# Patient Record
Sex: Female | Born: 1945
Health system: Southern US, Community
[De-identification: ages and names within clinical notes are randomized; demographics above are authoritative.]

## PROBLEM LIST (undated history)

## (undated) ENCOUNTER — Emergency Department (HOSPITAL_COMMUNITY): Admission: EM | Payer: Medicare Other | Source: Home / Self Care

## (undated) DIAGNOSIS — G4733 Obstructive sleep apnea (adult) (pediatric): Secondary | ICD-10-CM

## (undated) DIAGNOSIS — Z0189 Encounter for other specified special examinations: Secondary | ICD-10-CM

## (undated) DIAGNOSIS — R0989 Other specified symptoms and signs involving the circulatory and respiratory systems: Secondary | ICD-10-CM

## (undated) DIAGNOSIS — I10 Essential (primary) hypertension: Secondary | ICD-10-CM

## (undated) DIAGNOSIS — I358 Other nonrheumatic aortic valve disorders: Secondary | ICD-10-CM

## (undated) DIAGNOSIS — R51 Headache: Secondary | ICD-10-CM

## (undated) DIAGNOSIS — R519 Headache, unspecified: Secondary | ICD-10-CM

## (undated) DIAGNOSIS — E7800 Pure hypercholesterolemia, unspecified: Secondary | ICD-10-CM

## (undated) DIAGNOSIS — R9439 Abnormal result of other cardiovascular function study: Secondary | ICD-10-CM

## (undated) DIAGNOSIS — B029 Zoster without complications: Secondary | ICD-10-CM

## (undated) DIAGNOSIS — R7303 Prediabetes: Secondary | ICD-10-CM

## (undated) DIAGNOSIS — M1990 Unspecified osteoarthritis, unspecified site: Secondary | ICD-10-CM

## (undated) DIAGNOSIS — J45909 Unspecified asthma, uncomplicated: Secondary | ICD-10-CM

## (undated) DIAGNOSIS — Z9989 Dependence on other enabling machines and devices: Secondary | ICD-10-CM

## (undated) HISTORY — DX: Unspecified asthma, uncomplicated: J45.909

## (undated) HISTORY — PX: TUBAL LIGATION: SHX77

## (undated) HISTORY — DX: Encounter for other specified special examinations: Z01.89

## (undated) HISTORY — DX: Abnormal result of other cardiovascular function study: R94.39

## (undated) HISTORY — DX: Dependence on other enabling machines and devices: Z99.89

## (undated) HISTORY — DX: Obstructive sleep apnea (adult) (pediatric): G47.33

## (undated) HISTORY — DX: Other nonrheumatic aortic valve disorders: I35.8

## (undated) HISTORY — DX: Other specified symptoms and signs involving the circulatory and respiratory systems: R09.89

---

## 1996-07-16 HISTORY — PX: CHOLECYSTECTOMY: SHX55

## 2015-08-26 DIAGNOSIS — R0602 Shortness of breath: Secondary | ICD-10-CM | POA: Diagnosis not present

## 2015-08-26 DIAGNOSIS — E119 Type 2 diabetes mellitus without complications: Secondary | ICD-10-CM | POA: Diagnosis not present

## 2015-08-26 DIAGNOSIS — I10 Essential (primary) hypertension: Secondary | ICD-10-CM | POA: Diagnosis not present

## 2015-08-26 DIAGNOSIS — E538 Deficiency of other specified B group vitamins: Secondary | ICD-10-CM | POA: Diagnosis not present

## 2015-11-24 DIAGNOSIS — M25519 Pain in unspecified shoulder: Secondary | ICD-10-CM | POA: Diagnosis not present

## 2015-11-24 DIAGNOSIS — M7540 Impingement syndrome of unspecified shoulder: Secondary | ICD-10-CM | POA: Diagnosis not present

## 2015-11-24 DIAGNOSIS — M19019 Primary osteoarthritis, unspecified shoulder: Secondary | ICD-10-CM | POA: Diagnosis not present

## 2015-12-05 DIAGNOSIS — M19019 Primary osteoarthritis, unspecified shoulder: Secondary | ICD-10-CM | POA: Diagnosis not present

## 2015-12-05 DIAGNOSIS — M7540 Impingement syndrome of unspecified shoulder: Secondary | ICD-10-CM | POA: Diagnosis not present

## 2015-12-05 DIAGNOSIS — M25519 Pain in unspecified shoulder: Secondary | ICD-10-CM | POA: Diagnosis not present

## 2015-12-21 DIAGNOSIS — Z008 Encounter for other general examination: Secondary | ICD-10-CM | POA: Diagnosis not present

## 2015-12-21 DIAGNOSIS — R011 Cardiac murmur, unspecified: Secondary | ICD-10-CM | POA: Diagnosis not present

## 2015-12-21 DIAGNOSIS — E119 Type 2 diabetes mellitus without complications: Secondary | ICD-10-CM | POA: Diagnosis not present

## 2015-12-21 DIAGNOSIS — Z713 Dietary counseling and surveillance: Secondary | ICD-10-CM | POA: Diagnosis not present

## 2015-12-21 DIAGNOSIS — E538 Deficiency of other specified B group vitamins: Secondary | ICD-10-CM | POA: Diagnosis not present

## 2015-12-21 DIAGNOSIS — I10 Essential (primary) hypertension: Secondary | ICD-10-CM | POA: Diagnosis not present

## 2015-12-21 DIAGNOSIS — R0602 Shortness of breath: Secondary | ICD-10-CM | POA: Diagnosis not present

## 2015-12-21 DIAGNOSIS — Z1231 Encounter for screening mammogram for malignant neoplasm of breast: Secondary | ICD-10-CM | POA: Diagnosis not present

## 2015-12-21 DIAGNOSIS — R079 Chest pain, unspecified: Secondary | ICD-10-CM | POA: Diagnosis not present

## 2015-12-21 DIAGNOSIS — E559 Vitamin D deficiency, unspecified: Secondary | ICD-10-CM | POA: Diagnosis not present

## 2015-12-22 DIAGNOSIS — M7540 Impingement syndrome of unspecified shoulder: Secondary | ICD-10-CM | POA: Diagnosis not present

## 2015-12-22 DIAGNOSIS — M25519 Pain in unspecified shoulder: Secondary | ICD-10-CM | POA: Diagnosis not present

## 2015-12-22 DIAGNOSIS — M19019 Primary osteoarthritis, unspecified shoulder: Secondary | ICD-10-CM | POA: Diagnosis not present

## 2015-12-27 DIAGNOSIS — R0602 Shortness of breath: Secondary | ICD-10-CM | POA: Diagnosis not present

## 2015-12-27 DIAGNOSIS — M7540 Impingement syndrome of unspecified shoulder: Secondary | ICD-10-CM | POA: Diagnosis not present

## 2015-12-27 DIAGNOSIS — M19019 Primary osteoarthritis, unspecified shoulder: Secondary | ICD-10-CM | POA: Diagnosis not present

## 2015-12-27 DIAGNOSIS — E559 Vitamin D deficiency, unspecified: Secondary | ICD-10-CM | POA: Diagnosis not present

## 2015-12-27 DIAGNOSIS — R011 Cardiac murmur, unspecified: Secondary | ICD-10-CM | POA: Diagnosis not present

## 2015-12-27 DIAGNOSIS — M25519 Pain in unspecified shoulder: Secondary | ICD-10-CM | POA: Diagnosis not present

## 2015-12-27 DIAGNOSIS — E119 Type 2 diabetes mellitus without complications: Secondary | ICD-10-CM | POA: Diagnosis not present

## 2015-12-27 DIAGNOSIS — I10 Essential (primary) hypertension: Secondary | ICD-10-CM | POA: Diagnosis not present

## 2016-03-28 DIAGNOSIS — E119 Type 2 diabetes mellitus without complications: Secondary | ICD-10-CM | POA: Diagnosis not present

## 2016-03-28 DIAGNOSIS — E559 Vitamin D deficiency, unspecified: Secondary | ICD-10-CM | POA: Diagnosis not present

## 2016-03-28 DIAGNOSIS — Z23 Encounter for immunization: Secondary | ICD-10-CM | POA: Diagnosis not present

## 2016-03-28 DIAGNOSIS — I10 Essential (primary) hypertension: Secondary | ICD-10-CM | POA: Diagnosis not present

## 2016-04-02 DIAGNOSIS — J309 Allergic rhinitis, unspecified: Secondary | ICD-10-CM | POA: Diagnosis not present

## 2016-04-02 DIAGNOSIS — L299 Pruritus, unspecified: Secondary | ICD-10-CM | POA: Diagnosis not present

## 2016-04-02 DIAGNOSIS — L509 Urticaria, unspecified: Secondary | ICD-10-CM | POA: Diagnosis not present

## 2016-04-02 DIAGNOSIS — Z91018 Allergy to other foods: Secondary | ICD-10-CM | POA: Diagnosis not present

## 2016-04-10 DIAGNOSIS — M7540 Impingement syndrome of unspecified shoulder: Secondary | ICD-10-CM | POA: Diagnosis not present

## 2016-04-10 DIAGNOSIS — M25569 Pain in unspecified knee: Secondary | ICD-10-CM | POA: Diagnosis not present

## 2016-04-10 DIAGNOSIS — M19019 Primary osteoarthritis, unspecified shoulder: Secondary | ICD-10-CM | POA: Diagnosis not present

## 2016-04-10 DIAGNOSIS — M170 Bilateral primary osteoarthritis of knee: Secondary | ICD-10-CM | POA: Diagnosis not present

## 2016-04-11 DIAGNOSIS — L298 Other pruritus: Secondary | ICD-10-CM | POA: Diagnosis not present

## 2016-04-11 DIAGNOSIS — T887XXA Unspecified adverse effect of drug or medicament, initial encounter: Secondary | ICD-10-CM | POA: Diagnosis not present

## 2016-04-16 DIAGNOSIS — M7540 Impingement syndrome of unspecified shoulder: Secondary | ICD-10-CM | POA: Diagnosis not present

## 2016-04-16 DIAGNOSIS — M19019 Primary osteoarthritis, unspecified shoulder: Secondary | ICD-10-CM | POA: Diagnosis not present

## 2016-04-16 DIAGNOSIS — M170 Bilateral primary osteoarthritis of knee: Secondary | ICD-10-CM | POA: Diagnosis not present

## 2016-04-16 DIAGNOSIS — M25569 Pain in unspecified knee: Secondary | ICD-10-CM | POA: Diagnosis not present

## 2016-04-30 DIAGNOSIS — Z91018 Allergy to other foods: Secondary | ICD-10-CM | POA: Diagnosis not present

## 2016-04-30 DIAGNOSIS — L299 Pruritus, unspecified: Secondary | ICD-10-CM | POA: Diagnosis not present

## 2016-04-30 DIAGNOSIS — L509 Urticaria, unspecified: Secondary | ICD-10-CM | POA: Diagnosis not present

## 2016-04-30 DIAGNOSIS — R21 Rash and other nonspecific skin eruption: Secondary | ICD-10-CM | POA: Diagnosis not present

## 2016-05-02 DIAGNOSIS — N3941 Urge incontinence: Secondary | ICD-10-CM | POA: Diagnosis not present

## 2016-05-02 DIAGNOSIS — E119 Type 2 diabetes mellitus without complications: Secondary | ICD-10-CM | POA: Diagnosis not present

## 2016-05-02 DIAGNOSIS — Z008 Encounter for other general examination: Secondary | ICD-10-CM | POA: Diagnosis not present

## 2016-05-02 DIAGNOSIS — E559 Vitamin D deficiency, unspecified: Secondary | ICD-10-CM | POA: Diagnosis not present

## 2016-05-02 DIAGNOSIS — N8112 Cystocele, lateral: Secondary | ICD-10-CM | POA: Diagnosis not present

## 2016-06-26 ENCOUNTER — Ambulatory Visit (HOSPITAL_COMMUNITY)
Admission: EM | Admit: 2016-06-26 | Discharge: 2016-06-26 | Disposition: A | Discharge status: Home / Self Care | Payer: Medicare Other | Attending: Emergency Medicine | Admitting: Emergency Medicine

## 2016-06-26 ENCOUNTER — Encounter (HOSPITAL_COMMUNITY): Payer: Self-pay | Admitting: Emergency Medicine

## 2016-06-26 DIAGNOSIS — R42 Dizziness and giddiness: Secondary | ICD-10-CM

## 2016-06-26 HISTORY — DX: Essential (primary) hypertension: I10

## 2016-06-26 LAB — POCT URINALYSIS DIP (DEVICE)
Bilirubin Urine: NEGATIVE
Glucose, UA: NEGATIVE mg/dL
Hgb urine dipstick: NEGATIVE
Leukocytes, UA: NEGATIVE
Nitrite: NEGATIVE
Protein, ur: NEGATIVE mg/dL
Specific Gravity, Urine: 1.025 (ref 1.005–1.030)
Urobilinogen, UA: 0.2 mg/dL (ref 0.0–1.0)
pH: 5.5 (ref 5.0–8.0)

## 2016-06-26 LAB — POCT I-STAT, CHEM 8
BUN: 14 mg/dL (ref 6–20)
Calcium, Ion: 1.32 mmol/L (ref 1.15–1.40)
Chloride: 102 mmol/L (ref 101–111)
Creatinine, Ser: 0.9 mg/dL (ref 0.44–1.00)
Glucose, Bld: 95 mg/dL (ref 65–99)
HCT: 43 % (ref 36.0–46.0)
Hemoglobin: 14.6 g/dL (ref 12.0–15.0)
Potassium: 4.5 mmol/L (ref 3.5–5.1)
Sodium: 142 mmol/L (ref 135–145)
TCO2: 27 mmol/L (ref 0–100)

## 2016-06-26 NOTE — ED Provider Notes (Signed)
HPI  SUBJECTIVE:  Rachel Crane is a 70 y.o. female who presents with several episodes of dizziness described as lightheadedness and feeling "off balance" lasting seconds for the past 2 days. States it happens primarily when she bends over to pick something up. She reports fatigue. Symptoms are worse with bending forward, no alleviating factors. She tried garlic for this. States that she is eating and drinking well, her appetite is good. States that she is drinking a liter to a liter and half of water per day. She denies fevers, nasal congestion, sinus pain or pressure, rhinorrhea, postnasal drip, tenderness, ear pain, bodyaches, headaches. No chest pain, shortness breath, coughing, wheezing. No abdominal pain, urinary complaints. No  arm or leg weakness, facial droop, dysarthria. No vaginal bleeding, melena, hematochezia, epistaxis. No change in her medications. She is not taking any other medicines on a regular basis. She has never had symptoms like this before. She has a past medical history of hypertension. No history of A. fib, arrhythmia, diabetes, stroke, hypercholesterolemia, MI, coronary artery disease. She does not smoke or drink. PMD: None   Past Medical History:  Diagnosis Date  . Hypertension     History reviewed. No pertinent surgical history.  History reviewed. No pertinent family history.  Social History  Substance Use Topics  . Smoking status: Never Smoker  . Smokeless tobacco: Never Used  . Alcohol use No    No current facility-administered medications for this encounter.   Current Outpatient Prescriptions:  .  amLODipine (NORVASC) 10 MG tablet, Take 10 mg by mouth daily., Disp: , Rfl:  .  cholecalciferol (VITAMIN D) 1000 units tablet, Take 1,000 Units by mouth daily., Disp: , Rfl:  .  Multiple Vitamins-Minerals (ONE-A-DAY WOMENS 50 PLUS PO), Take 1 tablet by mouth., Disp: , Rfl:   No Known Allergies   ROS  As noted in HPI.   Physical Exam  BP 135/76 (BP  Location: Left Arm)   Pulse 60   Temp 98.7 F (37.1 C) (Oral)   Resp 16   SpO2 100%    Orthostatic VS for the past 24 hrs:  BP- Lying Pulse- Lying BP- Sitting Pulse- Sitting BP- Standing at 0 minutes Pulse- Standing at 0 minutes  06/26/16 1319 124/79 58 131/82 67 134/84 74     Constitutional: Well developed, well nourished, no acute distress Eyes: PERRL, EOMI, conjunctiva normal bilaterally HENT: Normocephalic, atraumatic,mucus membranes moist. TMs normal bilaterally. No nasal congestion. No sinus tenderness. Respiratory: Clear to auscultation bilaterally, no rales, no wheezing, no rhonchi Cardiovascular: Normal rate and rhythm, no murmurs, no gallops, no rubs. No carotid bruit GI: Soft, nondistended, normal bowel sounds, nontender, no rebound, no guarding Back: no CVAT skin: No rash, skin intact Musculoskeletal: No edema, no tenderness, no deformities Neurologic: Alert & oriented x 3, CN II-XII intact, no motor deficits, sensation grossly intact finger-nose, heel shin within normal limits. Dix-Hallpike negative bilaterally. Psychiatric: Speech and behavior appropriate   ED Course   Medications - No data to display  Orders Placed This Encounter  Procedures  . Orthostatic vital signs    Standing Status:   Standing    Number of Occurrences:   1  . POCT urinalysis dip (device)    Standing Status:   Standing    Number of Occurrences:   1  . I-STAT, chem 8    Standing Status:   Standing    Number of Occurrences:   1  . ED EKG    Standing Status:   Standing  Number of Occurrences:   1    Order Specific Question:   Reason for Exam    Answer:   Weakness   Results for orders placed or performed during the hospital encounter of 06/26/16 (from the past 24 hour(s))  POCT urinalysis dip (device)     Status: Abnormal   Collection Time: 06/26/16  1:03 PM  Result Value Ref Range   Glucose, UA NEGATIVE NEGATIVE mg/dL   Bilirubin Urine NEGATIVE NEGATIVE   Ketones, ur TRACE (A)  NEGATIVE mg/dL   Specific Gravity, Urine 1.025 1.005 - 1.030   Hgb urine dipstick NEGATIVE NEGATIVE   pH 5.5 5.0 - 8.0   Protein, ur NEGATIVE NEGATIVE mg/dL   Urobilinogen, UA 0.2 0.0 - 1.0 mg/dL   Nitrite NEGATIVE NEGATIVE   Leukocytes, UA NEGATIVE NEGATIVE  I-STAT, chem 8     Status: None   Collection Time: 06/26/16  1:11 PM  Result Value Ref Range   Sodium 142 135 - 145 mmol/L   Potassium 4.5 3.5 - 5.1 mmol/L   Chloride 102 101 - 111 mmol/L   BUN 14 6 - 20 mg/dL   Creatinine, Ser 0.90 0.44 - 1.00 mg/dL   Glucose, Bld 95 65 - 99 mg/dL   Calcium, Ion 1.32 1.15 - 1.40 mmol/L   TCO2 27 0 - 100 mmol/L   Hemoglobin 14.6 12.0 - 15.0 g/dL   HCT 43.0 36.0 - 46.0 %   No results found.  ED Clinical Impression  Lightheadedness   ED Assessment/Plan  This does not appear to be a stroke. Pt has no other c/o.  We'll check i-STAT, orthostatics, EKG and UA.   EKG: Normal sinus rhythm, rate 60. Normal axis, normal intervals. No hypertrophy. No ST-T wave changes. No previous EKG for comparison.  UA: No UTI. Trace ketones. I-STAT: Normal.  Patient is almost orthostatic, suspect that this is where her symptoms are coming from.  Will advise patient to push fluids, and provide a primary care referral resource list. She will go to the ER for any worsening of her symptoms, chest pain, shortness breath, syncope.  Discussed labs,  MDM, plan and followup with patient. Discussed sn/sx that should prompt return to the ED. Patient  agrees with plan.   Meds ordered this encounter  Medications  . amLODipine (NORVASC) 10 MG tablet    Sig: Take 10 mg by mouth daily.  . Multiple Vitamins-Minerals (ONE-A-DAY WOMENS 50 PLUS PO)    Sig: Take 1 tablet by mouth.  . cholecalciferol (VITAMIN D) 1000 units tablet    Sig: Take 1,000 Units by mouth daily.    *This clinic note was created using Dragon dictation software. Therefore, there may be occasional mistakes despite careful proofreading.  ?     Melynda Ripple, MD 06/26/16 1535

## 2016-06-26 NOTE — Discharge Instructions (Signed)
You need to drink at least 2 L of non-sugary, non-caffeinated beverages per day. Follow up with one of  the primary care physician's below. Go to the ER for chest pain, shortness of breath, if you pass out, or for other concerns.  Below is a list of primary care practices who are taking new patients for you to follow-up with. [facility] [place], [place] [address] [phone]  [facility] [phone] [place] [address]  [facility]: [place]  [phone]  [facility]: [place]   [phone]  [facility]: [address] [place]  [phone]  [place] [facility] : [address] [place] [phone]  [facility]: [address] [place] [phone]  [facility]: [place] [phone]  Dr. Blanchie Serve [place]  [phone]  Dr. Benito Mccreedy, [facility]. [place], [place] [address]  [phone]

## 2016-06-26 NOTE — ED Triage Notes (Signed)
Pt has been suffering from dizziness and fatigue since Sunday.  She reports "a funny felling" in her head.

## 2016-06-30 ENCOUNTER — Encounter (HOSPITAL_COMMUNITY): Payer: Self-pay | Admitting: *Deleted

## 2016-06-30 ENCOUNTER — Emergency Department (HOSPITAL_COMMUNITY)
Admission: EM | Admit: 2016-06-30 | Discharge: 2016-06-30 | Disposition: A | Discharge status: Home / Self Care | Payer: Medicare Other | Attending: Emergency Medicine | Admitting: Emergency Medicine

## 2016-06-30 DIAGNOSIS — R350 Frequency of micturition: Secondary | ICD-10-CM | POA: Diagnosis not present

## 2016-06-30 DIAGNOSIS — Z8742 Personal history of other diseases of the female genital tract: Secondary | ICD-10-CM | POA: Diagnosis not present

## 2016-06-30 DIAGNOSIS — R102 Pelvic and perineal pain: Secondary | ICD-10-CM | POA: Diagnosis not present

## 2016-06-30 DIAGNOSIS — I10 Essential (primary) hypertension: Secondary | ICD-10-CM | POA: Diagnosis not present

## 2016-06-30 LAB — URINALYSIS, ROUTINE W REFLEX MICROSCOPIC
Bilirubin Urine: NEGATIVE
Glucose, UA: NEGATIVE mg/dL
Hgb urine dipstick: NEGATIVE
Ketones, ur: NEGATIVE mg/dL
Nitrite: NEGATIVE
Protein, ur: NEGATIVE mg/dL
Specific Gravity, Urine: 1.016 (ref 1.005–1.030)
pH: 6 (ref 5.0–8.0)

## 2016-06-30 LAB — WET PREP, GENITAL
Clue Cells Wet Prep HPF POC: NONE SEEN
Sperm: NONE SEEN
Trich, Wet Prep: NONE SEEN
Yeast Wet Prep HPF POC: NONE SEEN

## 2016-06-30 MED ORDER — TRAMADOL HCL 50 MG PO TABS
50.0000 mg | ORAL_TABLET | Freq: Once | ORAL | Status: AC
  Administered 2016-06-30: 50 mg via ORAL
  Filled 2016-06-30: qty 1

## 2016-06-30 MED ORDER — TRAMADOL HCL 50 MG PO TABS: 50.0000 mg | ORAL_TABLET | Freq: Three times a day (TID) | ORAL | 0 refills | Status: DC | PRN

## 2016-06-30 NOTE — ED Notes (Signed)
Pt ambulated to restroom, pt tolerated well.  

## 2016-06-30 NOTE — ED Provider Notes (Signed)
[facility] DEPT Provider Note   CSN: [id number] Arrival date & time: 06/30/16  [id number]     History   Chief Complaint Chief Complaint  Patient presents with  . Vaginal Pain    HPI Rachel Crane is a 70 y.o. female.  The history is provided by the patient and medical records. No language interpreter was used.  Vaginal Pain  Pertinent negatives include no abdominal pain and no shortness of breath.   Rachel Crane is a 70 y.o. female  with a PMH of HTN who presents to the Emergency Department complaining of left sided vaginal "burning" x 4 days. Associated symptoms Include urinary frequency-noting that she went to the restroom 6 times last night. She denies burning with urination, but notes a burning sensation to the vagina intermittently throughout the day. She took ibuprofen with some mild relief in symptoms. Patient states that her husband passed away 3 years ago and she has not had intercourse with anyone since that time. She recently moved from [place] and had a gynecologist whom she saw a few years ago. He told her that her uterus was lower andpeople and to return to see him if she ever had any vaginal complaints. She has not seen a  gynecologist in [place], but she has made an appointment for Monday morning at 9:45 AM. She denies associated fevers, abdominal pain, n/v or back pain. No vaginal discharge.   Past Medical History:  Diagnosis Date  . Hypertension     There are no active problems to display for this patient.   Past Surgical History:  Procedure Laterality Date  . CHOLECYSTECTOMY      OB History    No data available       Home Medications    Prior to Admission medications   Medication Sig Start Date End Date Taking? Authorizing Provider  amLODipine (NORVASC) 10 MG tablet Take 10 mg by mouth daily.   Yes Historical Provider, MD  Multiple Vitamins-Minerals (ONE-A-DAY WOMENS 50 PLUS PO) Take 1 tablet by mouth daily.    Yes Historical Provider, MD    traMADol (ULTRAM) 50 MG tablet Take 1 tablet (50 mg total) by mouth every 8 (eight) hours as needed for moderate pain or severe pain. 06/30/16   [place]ell, PA-C    Family History No family history on file.  Social History Social History  Substance Use Topics  . Smoking status: Never Smoker  . Smokeless tobacco: Never Used  . Alcohol use No     Allergies   Patient has no known allergies.   Review of Systems Review of Systems  Constitutional: Negative for chills and fever.  HENT: Negative for congestion.   Eyes: Negative for visual disturbance.  Respiratory: Negative for cough and shortness of breath.   Cardiovascular: Negative.   Gastrointestinal: Negative for abdominal pain, nausea and vomiting.  Genitourinary: Positive for frequency and vaginal pain. Negative for dysuria, flank pain, vaginal bleeding and vaginal discharge.  Musculoskeletal: Negative for back pain and neck pain.  Skin: Negative for rash.  Allergic/Immunologic: Negative for immunocompromised state.     Physical Exam Updated Vital Signs BP 116/59   Pulse 61   Temp 98.1 F (36.7 C) (Oral)   Resp 18   Ht 5\' 6"  (1.676 m)   Wt 94.3 kg   SpO2 99%   BMI 33.57 kg/m   Physical Exam  Constitutional: She is oriented to person, place, and time. She appears well-developed and well-nourished. No distress.  HENT:  Head: Normocephalic and atraumatic.  Cardiovascular: Normal rate, regular rhythm and normal heart sounds.   No murmur heard. Pulmonary/Chest: Effort normal and breath sounds normal. No respiratory distress.  Abdominal: Soft. She exhibits no distension. There is no tenderness.  No CVA tenderness.  Genitourinary:  Genitourinary Comments: Chaperone present for exam. On manual exam, uterus descending into upper vaginal canal. No rashes, lesions, or tenderness to external genitalia. No erythema, injury, or tenderness to vaginal mucosa. No vaginal discharge or bleeding within vaginal vault. No  adnexal masses, tenderness, or fullness.   Musculoskeletal: She exhibits no edema.  Neurological: She is alert and oriented to person, place, and time.  Skin: Skin is warm and dry.  Nursing note and vitals reviewed.    ED Treatments / Results  Labs (all labs ordered are listed, but only abnormal results are displayed) Labs Reviewed  WET PREP, GENITAL - Abnormal; Notable for the following:       Result Value   WBC, Wet Prep HPF POC MODERATE (*)    All other components within normal limits  URINALYSIS, ROUTINE W REFLEX MICROSCOPIC - Abnormal; Notable for the following:    Leukocytes, UA TRACE (*)    Bacteria, UA RARE (*)    Squamous Epithelial / LPF 0-5 (*)    All other components within normal limits    EKG  EKG Interpretation None       Radiology No results found.  Procedures Procedures (including critical care time)  Medications Ordered in ED Medications  traMADol (ULTRAM) tablet 50 mg (50 mg Oral Given 06/30/16 1136)     Initial Impression / Assessment and Plan / ED Course  I have reviewed the triage vital signs and the nursing notes.  Pertinent labs & imaging results that were available during my care of the patient were reviewed by me and considered in my medical decision making (see chart for details).  Clinical Course    Rachel Crane is a 70 y.o. female who presents to ED for a burning sensation to vagina (not with urination) and urinary frequency, although denies dysuria. This has been occurring for the last 4 days. On exam, Patient is nontoxic appearing, afebrile with reassuring vital signs. She has no abdominal or CVA tenderness. Per patient, has been told that her uterus is lower than most and pelvic exam does show uterine prolapse into vaginal canal but not outside the introitus. UA with no signs of infection. Wet prep reassuring. Evaluation does not show pathology that would require ongoing emergent intervention or inpatient treatment. Patient is  hemodynamically stable and mentating appropriately. Patient has an appointment with gynecologist for an initial visit Monday morning (2 days). I encouraged her to keep this scheduled appointment for discussion of today's findings. She does not have a primary care provider in the area and importance of PCP follow-up was discussed. Reasons to return to the ER were discussed and all questions answered.  Patient seen by and discussed with Dr. Ashok Cordia who agrees with treatment plan.    Final Clinical Impressions(s) / ED Diagnoses   Final diagnoses:  History of uterine prolapse    New Prescriptions Discharge Medication List as of 06/30/2016 12:08 PM    START taking these medications   Details  traMADol (ULTRAM) 50 MG tablet Take 1 tablet (50 mg total) by mouth every 8 (eight) hours as needed for moderate pain or severe pain., Starting Sat 06/30/2016, Print         [employer], [place] 06/30/16 1311  Lajean Saver, MD 06/30/16 1434

## 2016-06-30 NOTE — Discharge Instructions (Signed)
Keep your scheduled appointment with the gynecologist on Monday morning. Pain medication only as needed for severe pain, best to take at night approximately an hour before bedtime. Return to ER for new or worsening symptoms, initial concerns. Call the number listed on your insurance card and asked them for help finding a primary care provider in the area.

## 2016-06-30 NOTE — ED Triage Notes (Signed)
Pt states vaginal burning since Sunday accompanied by L lower back pain.  Denies vaginal discharge, but states burning with urination.

## 2016-07-02 ENCOUNTER — Other Ambulatory Visit: Payer: Self-pay | Admitting: Obstetrics & Gynecology

## 2016-07-02 DIAGNOSIS — N898 Other specified noninflammatory disorders of vagina: Secondary | ICD-10-CM | POA: Diagnosis not present

## 2016-07-02 DIAGNOSIS — R102 Pelvic and perineal pain: Secondary | ICD-10-CM | POA: Diagnosis not present

## 2016-07-03 DIAGNOSIS — R102 Pelvic and perineal pain: Secondary | ICD-10-CM | POA: Diagnosis not present

## 2016-07-05 ENCOUNTER — Other Ambulatory Visit: Payer: Medicare Other

## 2016-08-30 ENCOUNTER — Emergency Department (HOSPITAL_COMMUNITY): Payer: Medicare Other

## 2016-08-30 ENCOUNTER — Encounter (HOSPITAL_COMMUNITY): Payer: Self-pay

## 2016-08-30 ENCOUNTER — Emergency Department (HOSPITAL_COMMUNITY)
Admission: EM | Admit: 2016-08-30 | Discharge: 2016-08-30 | Disposition: A | Discharge status: Home / Self Care | Payer: Medicare Other | Attending: Emergency Medicine | Admitting: Emergency Medicine

## 2016-08-30 DIAGNOSIS — Z79899 Other long term (current) drug therapy: Secondary | ICD-10-CM | POA: Diagnosis not present

## 2016-08-30 DIAGNOSIS — R51 Headache: Secondary | ICD-10-CM | POA: Diagnosis not present

## 2016-08-30 DIAGNOSIS — I10 Essential (primary) hypertension: Secondary | ICD-10-CM | POA: Insufficient documentation

## 2016-08-30 DIAGNOSIS — R519 Headache, unspecified: Secondary | ICD-10-CM

## 2016-08-30 HISTORY — DX: Unspecified osteoarthritis, unspecified site: M19.90

## 2016-08-30 MED ORDER — ACETAMINOPHEN 325 MG PO TABS
650.0000 mg | ORAL_TABLET | Freq: Once | ORAL | Status: AC
  Administered 2016-08-30: 650 mg via ORAL
  Filled 2016-08-30: qty 2

## 2016-08-30 MED ORDER — ACETAMINOPHEN 325 MG PO TABS: 650.0000 mg | ORAL_TABLET | Freq: Four times a day (QID) | ORAL | 0 refills | Status: DC | PRN

## 2016-08-30 MED ORDER — IBUPROFEN 400 MG PO TABS: 400.0000 mg | ORAL_TABLET | Freq: Four times a day (QID) | ORAL | 0 refills | Status: DC | PRN

## 2016-08-30 MED ORDER — METOCLOPRAMIDE HCL 5 MG/ML IJ SOLN
10.0000 mg | Freq: Once | INTRAMUSCULAR | Status: DC
  Filled 2016-08-30: qty 2

## 2016-08-30 MED ORDER — NAPROXEN 250 MG PO TABS
500.0000 mg | ORAL_TABLET | Freq: Once | ORAL | Status: AC
  Administered 2016-08-30: 500 mg via ORAL
  Filled 2016-08-30: qty 2

## 2016-08-30 NOTE — ED Notes (Signed)
Pt complains of dizziness/ Pain scale 7 RN notified

## 2016-08-30 NOTE — ED Notes (Signed)
Declined W/C at D/C and was escorted to lobby by RN. 

## 2016-08-30 NOTE — ED Triage Notes (Signed)
Informed EDP that Pt wanted to go home.

## 2016-08-30 NOTE — ED Notes (Signed)
States ibuprofen helps pain but still unable to sleep.

## 2016-08-30 NOTE — ED Notes (Signed)
Patient transported to CT 

## 2016-08-30 NOTE — ED Triage Notes (Signed)
PT reports her nausea is due to the fact she is hungry . Pr does not want nausea meds.

## 2016-08-30 NOTE — ED Provider Notes (Signed)
[facility] DEPT Provider Note   CSN: [id number] Arrival date & time: 08/30/16  1133   By signing my name below, I, Evelene Croon, attest that this documentation has been prepared under the direction and in the presence of Varney Biles, MD . Electronically Signed: Evelene Croon, Scribe. 08/30/2016. 1:59 PM.  History   Chief Complaint Chief Complaint  Patient presents with  . Headache    The history is provided by the patient. No language interpreter was used.     HPI Comments:  Rachel Crane is a 71 y.o. female with a history of HTN, who presents to the Emergency Department complaining of a HA  X 2 weeks. She reports pain predominantly to the  top of her head. At initial time of onset she felt a sharp pain to the occipital region. She notes the sharp pain is intermittent but her pain at this time is more consistent. She reports difficulty sleeping due to the pain; states pain is worse when she lays down, describes a throbbing pain when she lays down. She has taken ibuprofen with mild relief but states she has stopped taking it because she doesn't like to take pills. No exacerbation of pain by light or sound. She denies use of blood thinners. No recent head injury/trauma.  No nausea, vomiting, CP, dizziness, confusion, new weakness, numbness or acute vision changes. She is compliant with her HTN meds.   Past Medical History:  Diagnosis Date  . Arthritis   . Hypertension     There are no active problems to display for this patient.   Past Surgical History:  Procedure Laterality Date  . CHOLECYSTECTOMY      OB History    No data available       Home Medications    Prior to Admission medications   Medication Sig Start Date End Date Taking? Authorizing Provider  amLODipine (NORVASC) 10 MG tablet Take 10 mg by mouth daily.   Yes Historical Provider, MD  Multiple Vitamins-Minerals (ONE-A-DAY WOMENS 50 PLUS PO) Take 1 tablet by mouth daily.    Yes Historical Provider, MD    Omega-3 Fatty Acids (FISH OIL) 1000 MG CAPS Take 1,000 mg by mouth daily.   Yes Historical Provider, MD  acetaminophen (TYLENOL) 325 MG tablet Take 2 tablets (650 mg total) by mouth every 6 (six) hours as needed. 08/30/16   Varney Biles, MD  ibuprofen (ADVIL,MOTRIN) 400 MG tablet Take 1 tablet (400 mg total) by mouth every 6 (six) hours as needed. 08/30/16   Varney Biles, MD  traMADol (ULTRAM) 50 MG tablet Take 1 tablet (50 mg total) by mouth every 8 (eight) hours as needed for moderate pain or severe pain. Patient not taking: Reported on 08/30/2016 06/30/16   [place], PA-C    Family History History reviewed. No pertinent family history.  Social History Social History  Substance Use Topics  . Smoking status: Never Smoker  . Smokeless tobacco: Never Used  . Alcohol use No     Allergies   Patient has no known allergies.   Review of Systems Review of Systems  10 systems reviewed and all are negative for acute change except as noted in the HPI.   Physical Exam Updated Vital Signs BP 146/73 (BP Location: Right Arm)   Pulse 64   Temp 98.6 F (37 C) (Oral)   Resp 18   Ht 5\' 6"  (1.676 m)   Wt 203 lb (92.1 kg)   SpO2 99%   BMI 32.77 kg/m  Physical Exam  Constitutional: She is oriented to person, place, and time. She appears well-developed and well-nourished. No distress.  HENT:  Head: Normocephalic and atraumatic.  Eyes: Conjunctivae are normal.  No horizontal or vertical nystagmus   Neck: Normal range of motion.  Cardiovascular: Normal rate, regular rhythm and normal heart sounds.   Pulmonary/Chest: Effort normal and breath sounds normal. No respiratory distress.  Lungs clear to auscultation  Abdominal: Soft. There is no tenderness.  Musculoskeletal: Normal range of motion.  Neurological: She is alert and oriented to person, place, and time. No cranial nerve deficit.  CN 2-12 intact Upper and lower extremity strength intact, motor exam equal   Skin:  Skin is warm and dry.  Psychiatric: She has a normal mood and affect.  Nursing note and vitals reviewed.    ED Treatments / Results  DIAGNOSTIC STUDIES:  Oxygen Saturation is 98% on RA, normal by my interpretation.    COORDINATION OF CARE:  1:57 PM Discussed treatment plan with pt at bedside and pt agreed to plan.  Labs (all labs ordered are listed, but only abnormal results are displayed) Labs Reviewed - No data to display  EKG  EKG Interpretation None       Radiology Ct Head Wo Contrast  Result Date: 08/30/2016 CLINICAL DATA:  71 year old female with 1 month history of right-sided posterior headache extending to the vertex. EXAM: CT HEAD WITHOUT CONTRAST TECHNIQUE: Contiguous axial images were obtained from the base of the skull through the vertex without intravenous contrast. COMPARISON:  No priors. FINDINGS: Brain: No evidence of acute infarction, hemorrhage, hydrocephalus, extra-axial collection or mass lesion/mass effect. Vascular: No hyperdense vessel or unexpected calcification. Skull: Normal. Negative for fracture or focal lesion. Sinuses/Orbits: No acute finding. Other: None. IMPRESSION: 1. No acute intracranial abnormalities. 2. The appearance of the brain is normal. Electronically Signed   By: Vinnie Langton M.D.   On: 08/30/2016 15:42    Procedures Procedures (including critical care time)  Medications Ordered in ED Medications  metoCLOPramide (REGLAN) injection 10 mg (10 mg Intramuscular Not Given 08/30/16 1632)  acetaminophen (TYLENOL) tablet 650 mg (650 mg Oral Given 08/30/16 1522)  naproxen (NAPROSYN) tablet 500 mg (500 mg Oral Given 08/30/16 1634)     Initial Impression / Assessment and Plan / ED Course  I have reviewed the triage vital signs and the nursing notes.  Pertinent labs & imaging results that were available during my care of the patient were reviewed by me and considered in my medical decision making (see chart for details).      4:58 PM  Pt reassessed and updated with results. Instructed to follow up with neurology. Return precautions given at this time.  At reassessment time, pt reported that her pain was better with meds in the ER. No narcotic was given in the ER.  Results from the ER workup discussed with the patient face to face and all questions answered to the best of my ability.  Strict ER return precautions have been discussed, and patient is agreeing with the plan and is comfortable with the workup done and the recommendations from the ER.   Final Clinical Impressions(s) / ED Diagnoses   Final diagnoses:  Bad headache    New Prescriptions Discharge Medication List as of 08/30/2016  5:09 PM    START taking these medications   Details  acetaminophen (TYLENOL) 325 MG tablet Take 2 tablets (650 mg total) by mouth every 6 (six) hours as needed., Starting Thu 08/30/2016, Print  ibuprofen (ADVIL,MOTRIN) 400 MG tablet Take 1 tablet (400 mg total) by mouth every 6 (six) hours as needed., Starting Thu 08/30/2016, Print       I personally performed the services described in this documentation, which was scribed in my presence. The recorded information has been reviewed and is accurate.   DDX includes: Primary headaches - including migrainous headaches, cluster headaches, tension headaches. ICH Carotid dissection Cavernous sinus thrombosiss Tumor Vascular headaches AV malformation Brain aneurysm Muscular headaches Temporal arteritis  A/P: Pt comes in with cc of headaches. Headaches have been present for several days now, and seems like the are worse at night time when she tried to sleep. Pt has no associated nausea, vomiting, seizures, loss of consciousness or new visual complains, weakness, numbness, dizziness or gait instability. We will get CT head - r/o space occupying lesions.  Pt's pain is in the neck area and radiates up. The pain is not severe, there is no associated dizziness, vision changes and the  pain is worse with movement. We considered vertebral dissection and aneurysms in our differential - but with the waxing and waning of the pain and no focal neuro deficits - we think CT angio will not be  necessary emergently. Pt truly doesn't have hard risk factors for either condition - she has no family hx of vascular disease or brain bleeds and she denies trauma and her BP is well controlled and she doesn't smoke.  Pt has no pain around the temple region and no jaw pain - so doubt temporal arteritis. With the same thought process, it seems that this is not glaucoma.     Varney Biles, MD 08/30/16 1739

## 2016-08-30 NOTE — ED Triage Notes (Signed)
Pt presents with 2 week h/o R sided neck pain that radiates to top of her head.  Ibuprofen reduces pain " a little" but reports she can't sleep due to pain.  Pt reports turning her head increases pain, denies any injury.

## 2016-08-30 NOTE — Discharge Instructions (Signed)
We saw you in the ER for headaches. All the labs and imaging are normal. °We are not sure what is causing your headaches, however, there appears to be no evidence of infection, bleeds or tumors based on our exam and results. ° °Please take motrin round the clock for the next 6 hours, and take other meds prescribed only for break through pain. °See your doctor if the pain persists, as you might need better medications or a specialist. ° °Please return to the ER if the headache gets severe and in not improving, you have associated new one sided numbness, tingling, weakness or confusion, seizures, poor balance or poor vision. ° ° ° ° °

## 2016-08-30 NOTE — ED Notes (Signed)
Pt state she understands instructions. Home astable with steady gait with friend.

## 2016-09-06 ENCOUNTER — Encounter: Payer: Self-pay | Admitting: Neurology

## 2016-09-06 ENCOUNTER — Ambulatory Visit (INDEPENDENT_AMBULATORY_CARE_PROVIDER_SITE_OTHER): Payer: Medicare Other | Admitting: Neurology

## 2016-09-06 VITALS — BP 134/79 | HR 72 | Resp 20 | Ht 66.0 in | Wt 208.0 lb

## 2016-09-06 DIAGNOSIS — R351 Nocturia: Secondary | ICD-10-CM | POA: Diagnosis not present

## 2016-09-06 DIAGNOSIS — F419 Anxiety disorder, unspecified: Secondary | ICD-10-CM

## 2016-09-06 DIAGNOSIS — R519 Headache, unspecified: Secondary | ICD-10-CM

## 2016-09-06 DIAGNOSIS — R51 Headache: Secondary | ICD-10-CM

## 2016-09-06 DIAGNOSIS — R0683 Snoring: Secondary | ICD-10-CM

## 2016-09-06 NOTE — Patient Instructions (Addendum)
You can take benadryl but not every day. I think your headaches may be related to sleep deprivation.   Your exam looks good neurologically and your CT head was normal, which is reassuring.   Based on your symptoms and your exam I believe you are at risk for obstructive sleep apnea or OSA, and I think we should proceed with a sleep study to determine whether you do or do not have OSA and how severe it is. If you have more than mild OSA, I want you to consider treatment with CPAP. Please remember, the risks and ramifications of moderate to severe obstructive sleep apnea or OSA are: Cardiovascular disease, including congestive heart failure, stroke, difficult to control hypertension, arrhythmias, and even type 2 diabetes has been linked to untreated OSA. Sleep apnea causes disruption of sleep and sleep deprivation in most cases, which, in turn, can cause recurrent headaches, problems with memory, mood, concentration, focus, and vigilance. Most people with untreated sleep apnea report excessive daytime sleepiness, which can affect their ability to drive. Please do not drive if you feel sleepy.   I will likely see you back after your sleep study to go over the test results and where to go from there. We will call you after your sleep study to advise about the results (most likely, you will hear from Beverlee Nims, my nurse) and to set up an appointment at the time, as necessary.    Our sleep lab administrative assistant, Arrie Aran will meet with you or call you to schedule your sleep study. If you don't hear back from her by next week please feel free to call her at [phone]. This is her direct line and please leave a message with your phone number to call back if you get the voicemail box. She will call back as soon as possible.

## 2016-09-06 NOTE — Progress Notes (Signed)
Subjective:    Patient ID: Rachel Crane is a 71 y.o. female.  HPI     Star Age, MD, PhD [facility] [address], [address] [place], [place] [address]  I saw patient Rachel Crane as an emergency room referral for headaches. She is a 71 year old right-handed woman with an underlying medical history of hypertension, arthritis, and obesity, who presented to the emergency room on 08/30/2016 with a two-week history of severe headache which was primarily bilateral headache, also on the top of her head. I reviewed the emergency room records. She reported a sharp intermittent headache. It was worse with lying down and became throbbing. She had taken some ibuprofen over-the-counter. She did not report much in the way of photophobia. She had no nausea or vomiting or other neurological accompaniment and no history of trauma. She had a head CT without contrast on 08/30/2016 which I reviewed: No acute intracranial abnormalities. In addition, I first only reviewed the images through the PACS system. I did not detect any significant atrophy or white matter changes. She was treated symptomatically with Tylenol, naproxen, and she declined Reglan. She was discharged to home. She reports that she has not been able to sleep very well. She has intermittent, rare in fact episodes of sharp shooting pains that start in the right occipital area and lasts for only seconds at a time. In addition, she has woken up in the middle of the night with headaches that have been building up gradually and seemed to be worse when she does not sleep well. She reports significant issues with her sleep since she moved here from [place]. She moved in early November to be closer to her children. She has 1 child in [place], 2 in [place]. She lives in her own home. She has adjusted to her new living environment but still has some anxiety and has not been able to sleep very well. She has a known history of snoring. She  has woken herself up with a sense of gasping for air and foreign body sensation in her throat. She goes to bed sometimes as early as 8 PM, he watches TV in her bedroom but turns it off. She has a small dog that sometimes sleeps at the foot and but does not bother her. She lives alone. She is widowed for the past 7 years. Wakeup time varies. She is often up in the early morning hours and cannot go back to sleep. She has had more anxiety and even mild depression type symptoms lately. She is scheduled to see her new primary care provider on March 1. She does not drink caffeine daily, she drinks alcohol very occasionally and does not smoke. She tries to drink water and some juice. She has knee pain at times but denies frank restless leg type symptoms. She has significant nocturia about 3-4 times per average night. She tries to rest during the day but typically does not fall asleep. Epworth sleepiness score is 0 out of 24, fatigue score is 63 out of 63.  Her Past Medical History Is Significant For: Past Medical History:  Diagnosis Date  . Arthritis   . Hypertension     Her Past Surgical History Is Significant For: Past Surgical History:  Procedure Laterality Date  . CHOLECYSTECTOMY      Her Family History Is Significant For: No family history on file.  Her Social History Is Significant For: Social History   Social History  . Marital status: Widowed  Spouse name: N/A  . Number of children: N/A  . Years of education: N/A   Social History Main Topics  . Smoking status: Never Smoker  . Smokeless tobacco: Never Used  . Alcohol use No  . Drug use: No  . Sexual activity: Not Asked   Other Topics Concern  . None   Social History Narrative  . None    Her Allergies Are:  No Known Allergies:   Her Current Medications Are:  Outpatient Encounter Prescriptions as of 09/06/2016  Medication Sig  . acetaminophen (TYLENOL) 325 MG tablet Take 2 tablets (650 mg total) by mouth every 6 (six)  hours as needed.  Marland Kitchen amLODipine (NORVASC) 10 MG tablet Take 10 mg by mouth daily.  Marland Kitchen ibuprofen (ADVIL,MOTRIN) 400 MG tablet Take 1 tablet (400 mg total) by mouth every 6 (six) hours as needed.  . Multiple Vitamins-Minerals (ONE-A-DAY WOMENS 50 PLUS PO) Take 1 tablet by mouth daily.   . Omega-3 Fatty Acids (FISH OIL) 1000 MG CAPS Take 1,000 mg by mouth daily.  . traMADol (ULTRAM) 50 MG tablet Take 1 tablet (50 mg total) by mouth every 8 (eight) hours as needed for moderate pain or severe pain.   No facility-administered encounter medications on file as of 09/06/2016.   :  Review of Systems:  Out of a complete 14 point review of systems, all are reviewed and negative with the exception of these symptoms as listed below:  Review of Systems  Neurological:       Pt presents today to discuss her nocturnal headaches. Pt has never had a sleep study.  Epworth Sleepiness Scale 0= would never doze 1= slight chance of dozing 2= moderate chance of dozing 3= high chance of dozing  Sitting and reading: 0 Watching TV: 0 Sitting inactive in a public place (ex. Theater or meeting): 0 As a passenger in a car for an hour without a break: 0 Lying down to rest in the afternoon: 0 Sitting and talking to someone: 0 Sitting quietly after lunch (no alcohol): 0 In a car, while stopped in traffic: 0 Total: 0     Objective:  Neurologic Exam  Physical Exam Physical Examination:   Vitals:   09/06/16 1144  BP: 134/79  Pulse: 72  Resp: 20    General Examination: The patient is a very pleasant 71 y.o. female in no acute distress. She appears well-developed and well-nourished and well groomed. She is quite anxious appearing.  HEENT: Normocephalic, atraumatic, pupils are equal, round and reactive to light and accommodation. Funduscopic exam is difficult, she has bilateral cataracts. She has corrective eyeglasses. Extraocular tracking is good without limitation to gaze excursion or nystagmus noted. Normal  smooth pursuit is noted. Hearing is grossly intact. Face is symmetric with normal facial animation and normal facial sensation. Speech is clear with no dysarthria noted. There is no hypophonia. There is no lip, neck/head, jaw or voice tremor. Neck is supple with full range of passive and active motion. There are no carotid bruits on auscultation. Oropharynx exam reveals: mild mouth dryness, adequate dental hygiene and marked airway crowding, due to larger tongue, tonsils are 2+, larger uvula and thicker soft palate, smaller airway entry altogetherMallampati is class II. Tongue protrudes centrally and palate elevates symmetrically. Neck size is 15.5 inches. She has a Mild overbite.   Chest: Clear to auscultation without wheezing, rhonchi or crackles noted.  Heart: S1+S2+0, regular and normal without murmurs, rubs or gallops noted.   Abdomen: Soft, non-tender and non-distended with  normal bowel sounds appreciated on auscultation.  Extremities: There is trace pitting edema in the distal lower extremities bilaterally.   Skin: Warm and dry without trophic changes noted. There are no varicose veins.  Musculoskeletal: exam reveals no obvious joint deformities, tenderness or joint swelling or erythema, with the exception of mild bilateral knee pain, crepitation noted in left more than right knee.   Neurologically:  Mental status: The patient is awake, alert and oriented in all 4 spheres. Her immediate and remote memory, attention, language skills and fund of knowledge are appropriate. There is no evidence of aphasia, agnosia, apraxia or anomia. Speech is clear with normal prosody and enunciation. Thought process is linear. Mood is normal and affect is anxious. Cranial nerves II - XII are as described above under HEENT exam. In addition: shoulder shrug is normal with equal shoulder height noted. Motor exam: Normal bulk, strength and tone is noted. There is no drift, tremor or rebound. Romberg is negative.  Reflexes are 2+ throughout. Babinski: Toes are flexor bilaterally. Fine motor skills and coordination: intact with normal finger taps, normal hand movements, normal rapid alternating patting, normal foot taps and normal foot agility.  Cerebellar testing: No dysmetria or intention tremor on finger to nose testing. Heel to shin is unremarkable bilaterally. There is no truncal or gait ataxia.  Sensory exam: intact to light touch, pinprick, vibration in the upper and lower extremities.  Gait, station and balance: She stands easily. No veering to one side is noted. No leaning to one side is noted. Posture is age-appropriate and stance is narrow based. Gait shows normal stride length and normal pace. No problems turning are noted. Tandem walk is unremarkable.    Assessment and Plan:  In summary, Rachel Crane is a very pleasant 71 y.o.-year old female with an underlying medical history of hypertension, arthritis, and obesity, who presents for initial visit for recent onset of recurrent headaches. She describes very infrequent neuralgic type pain in her right occipital area which is not very disturbing or bothersome to her as it happens very infrequently, she also has had nocturnal headaches and morning headaches which are pressure-like sensation and worse when she sleep deprived. She has significant sleep-related issues, her history and physical exam are highly concerning for underlying obstructive sleep apnea which could explain the recurrent headaches as well. In addition she is quite anxious. She may have mild depression as well. She has an appointment coming up with her primary care provider soon, she is encouraged to address these issues with them as well. From my end of things, I would like to proceed with a sleep study to rule out sleep apnea as an underlying cause for some of her symptoms. Neurologically, she has a nonfocal exam and is reassured in that regard. Furthermore, recent head CT was unremarkable as  well. We talked about her ER visit and her head CT results as well. I explained the diagnosis of sleep apnea to her in detail. I explained sleep study testing to her as well. We talked about potential treatment options including surgical and nonsurgical treatment options. I explained the CPAP therapy option to her and she may be willing to try CPAP but is reluctant at this moment to consider it, she is quite anxious and I tried to alleviate her anxiety by reassuring her. For now, I suggested no new medication and as needed use of Tylenol. She has been trying Benadryl for the past couple weeks for sleep and is advised not to take it  on a night to night basis but she can certainly use it as needed. She can even bring it for a sleep study testing. I will see her back after testing is completed. I answered all her questions today and she was in agreement.

## 2016-09-13 DIAGNOSIS — I10 Essential (primary) hypertension: Secondary | ICD-10-CM | POA: Diagnosis not present

## 2016-09-13 DIAGNOSIS — Z23 Encounter for immunization: Secondary | ICD-10-CM | POA: Diagnosis not present

## 2016-09-13 DIAGNOSIS — R0683 Snoring: Secondary | ICD-10-CM | POA: Diagnosis not present

## 2016-09-13 DIAGNOSIS — E119 Type 2 diabetes mellitus without complications: Secondary | ICD-10-CM | POA: Diagnosis not present

## 2016-09-18 ENCOUNTER — Ambulatory Visit (INDEPENDENT_AMBULATORY_CARE_PROVIDER_SITE_OTHER): Payer: Medicare Other | Admitting: Neurology

## 2016-09-18 DIAGNOSIS — G4720 Circadian rhythm sleep disorder, unspecified type: Secondary | ICD-10-CM

## 2016-09-18 DIAGNOSIS — G4733 Obstructive sleep apnea (adult) (pediatric): Secondary | ICD-10-CM

## 2016-09-20 NOTE — Procedures (Signed)
PATIENT'S NAME:  Rachel Crane, Bobeck DOB:      1946/03/18      MR#:    [id number]     DATE OF RECORDING: 09/18/2016 Study Performed:   Baseline Polysomnogram HISTORY: 71 year old woman with a history of hypertension, arthritis, and obesity, who reports snoring and has woken herself up with a sense of gasping for air and foreign body sensation in her throat. She has nocturia about 3-4 times per average night. She tries to rest during the day but typically does not fall asleep. Epworth sleepiness score is 0 out of 24, fatigue score is 63 out of 63. Epworth Sleepiness score is 0/24 points.  The patient's weight 208 pounds with a height of 66 (inches), resulting in a BMI of 33.3 kg/m2. The patient's neck circumference measured 15.5 inches.  CURRENT MEDICATIONS: Acetaminophen, Amllodipine, Ibuprofen, Multi-Vitamin, Omega-3 and Tramadol   PROCEDURE:  This is a multichannel digital polysomnogram utilizing the Somnostar 11.2 system.  Electrodes and sensors were applied and monitored per AASM Specifications.   EEG, EOG, Chin and Limb EMG, were sampled at 200 Hz.  ECG, Snore and Nasal Pressure, Thermal Airflow, Respiratory Effort, CPAP Flow and Pressure, Oximetry was sampled at 50 Hz. Digital video and audio were recorded.      BASELINE STUDY  Lights Out was at 21:08 and Lights On at 05:05.  Total recording time (TRT) was 477.5 minutes, with a total sleep time (TST) of  387 minutes.   The patient's sleep latency was 34 minutes, which is mildly prolonged.  REM latency was 89.5 minutes, which is normal. The sleep efficiency was 81%.     SLEEP ARCHITECTURE: WASO (Wake after sleep onset) was 56.5 minutes with mild sleep fragmentation noted.  There were 17 minutes in Stage N1, 289.5 minutes Stage N2, 0 minutes Stage N3 and 80.5 minutes in Stage REM.  The percentage of Stage N1 was 4.4%, Stage N2 was 74.8%, which is increased, Stage N3 was absent, and Stage R (REM sleep) was 20.8%. which is normal.   The arousals were noted  as: 29 were spontaneous, 0 were associated with PLMs, 262 were associated with respiratory events.    Audio and video analysis did not show any abnormal or unusual movements, behaviors, phonations or vocalizations.  The patient took 1 bathroom break. Moderate to loud snoring was noted. The EKG was in keeping with normal sinus rhythm (NSR).  RESPIRATORY ANALYSIS:  There were a total of 261 respiratory events:  95 obstructive apneas, 0 central apneas and 0 mixed apneas with a total of 95 apneas and an apnea index (AI) of 14.7 /hour. There were 166 hypopneas with a hypopnea index of 25.7 /hour. The patient also had 0 respiratory event related arousals (RERAs).      The total APNEA/HYPOPNEA INDEX (AHI) was 40.5/hour and the total RESPIRATORY DISTURBANCE INDEX was 40.5 /hour.  78 events occurred in REM sleep and 252 events in NREM. The REM AHI was 58.1 /hour, versus a non-REM AHI of 35.8. The patient spent 186 minutes of total sleep time in the supine position and 201 minutes in non-supine.. The supine AHI was 44.2 versus a non-supine AHI of 37.0.  OXYGEN SATURATION & C02:  The Wake baseline 02 saturation was 96%, with the lowest being 81%. Time spent below 89% saturation equaled 43 minutes.  PERIODIC LIMB MOVEMENTS:  The patient had a total of 0 Periodic Limb Movements.  The Periodic Limb Movement (PLM) index was 0 and the PLM Arousal index was 0/hour.  Post-study, the patient indicated that sleep was better than usual.   IMPRESSION:  1. Obstructive Sleep Apnea (OSA) 2. Dysfunctions associated with sleep stages or arousal from sleep  RECOMMENDATIONS:  1. This overnight polysomnogram demonstrates severe obstructive sleep apnea. Treatment with positive airway pressure in the form of CPAP is recommended, and is achieved during a full night CPAP titration study for proper treatment settings and mask fitting. Adjunct and alternative treatment options may include supine sleep position with weight loss, an  oral appliance (aka dental device, custom made by a specialized dentist usually), or upper airway or jaw surgery (not usually first line treatments).  2. Please note that untreated obstructive sleep apnea carries additional perioperative morbidity. Patients with significant obstructive sleep apnea should receive perioperative PAP therapy and the surgeons and particularly the anesthesiologist should be informed of the diagnosis and the severity of the sleep disordered breathing. 3. This study shows sleep fragmentation and abnormal sleep stage percentages; these are nonspecific findings and per se do not signify an intrinsic sleep disorder or a cause for the patient's sleep-related symptoms. Causes include (but are not limited to) the first night effect of the sleep study, circadian rhythm disturbances, medication effect or an underlying mood disorder or medical problem.  4. The patient should be cautioned not to drive, work at heights, or operate dangerous or heavy equipment when tired or sleepy. Review and reiteration of good sleep hygiene measures should be pursued with any patient. 5. The patient will be seen in follow-up by Dr. Rexene Alberts at [facility] for discussion of the test results and further management strategies. The referring provider will be notified of the test results.  I certify that I have reviewed the entire raw data recording prior to the issuance of this report in accordance with the Standards of Accreditation of the American Academy of Sleep Medicine (AASM)    Star Age, MD, PhD Diplomat, American Board of Psychiatry and Neurology (Neurology and Sleep Medicine)

## 2016-09-20 NOTE — Addendum Note (Signed)
Addended by: Star Age on: 09/20/2016 07:59 AM   Modules accepted: Orders

## 2016-09-20 NOTE — Progress Notes (Signed)
Patient referred by ER for HAs, seen by me on 09/06/16, diagnostic PSG on 09/18/16.    Please call and notify the patient that the recent sleep study did confirm the diagnosis of severe obstructive sleep apnea and that I recommend treatment for this in the form of CPAP. This will require a repeat sleep study for proper titration and mask fitting. Please explain to patient and arrange for a CPAP titration study. I have placed an order in the chart. Thanks, and please route to [facility] for scheduling next sleep study.  Star Age, MD, PhD [facility])

## 2016-09-21 ENCOUNTER — Telehealth: Payer: Self-pay

## 2016-09-21 NOTE — Telephone Encounter (Signed)
-----   Message from Star Age, MD sent at 09/20/2016  7:59 AM EST ----- Patient referred by ER for HAs, seen by me on 09/06/16, diagnostic PSG on 09/18/16.    Please call and notify the patient that the recent sleep study did confirm the diagnosis of severe obstructive sleep apnea and that I recommend treatment for this in the form of CPAP. This will require a repeat sleep study for proper titration and mask fitting. Please explain to patient and arrange for a CPAP titration study. I have placed an order in the chart. Thanks, and please route to [facility] for scheduling next sleep study.  Star Age, MD, PhD [facility])

## 2016-09-21 NOTE — Telephone Encounter (Signed)
I spoke to patient and she is aware of results and recommendations. She is willing to proceed with titration study.  

## 2016-10-09 ENCOUNTER — Ambulatory Visit (INDEPENDENT_AMBULATORY_CARE_PROVIDER_SITE_OTHER): Payer: Medicare Other | Admitting: Neurology

## 2016-10-09 DIAGNOSIS — G4733 Obstructive sleep apnea (adult) (pediatric): Secondary | ICD-10-CM | POA: Diagnosis not present

## 2016-10-09 DIAGNOSIS — G4720 Circadian rhythm sleep disorder, unspecified type: Secondary | ICD-10-CM

## 2016-10-12 NOTE — Addendum Note (Signed)
Addended by: Star Age on: 10/12/2016 12:08 PM   Modules accepted: Orders

## 2016-10-12 NOTE — Progress Notes (Signed)
ER referral for HAs, seen on 09/06/16, PSG on 09/18/16, cpap study on 10/09/16:  Please call and inform patient that I have entered an order for treatment with positive airway pressure (PAP) treatment of obstructive sleep apnea (OSA). She did well during the latest sleep study with CPAP. We will, therefore, arrange for a machine for home use through a DME (durable medical equipment) company of Her choice; and I will see the patient back in follow-up in about 10 weeks. Please also explain to the patient that I will be looking out for compliance data, which can be downloaded from the machine (stored on an SD card, that is inserted in the machine) or via remote access through a modem, that is built into the machine. At the time of the followup appointment we will discuss sleep study results and how it is going with PAP treatment at home. Please advise patient to bring Her machine at the time of the first FU visit, even though this is cumbersome. Bringing the machine for every visit after that will likely not be needed, but often helps for the first visit to troubleshoot if needed. Please re-enforce the importance of compliance with treatment and the need for [place] to monitor compliance data - often an insurance requirement and actually good feedback for the patient as far as how they are doing.  Also remind patient, that any interim PAP machine or mask issues should be first addressed with the DME company, as they can often help better with technical and mask fit issues. Please ask if patient has a preference regarding DME company.  Please also make sure, the patient has a follow-up appointment with me in about 10 weeks from the setup date, thanks.  Once you have spoken to the patient - and faxed/routed report to PCP and referring MD (if other than PCP), you can close this encounter, thanks,   Star Age, MD, PhD [facility] ([place])

## 2016-10-12 NOTE — Procedures (Signed)
PATIENT'S NAME:  Rachel, Crane DOB:      [birth year]      MR#:    [id number]     DATE OF RECORDING: 10/09/2016 REFERRING M.D.:  Star Age, MD Study Performed:   CPAP  Titration HISTORY: 71 year old woman with a history of hypertension, arthritis, and obesity, who reports presents for full night CPAP titration for treatment of her OSA. Her baseline sleep study from 09/18/16 showed and AHI of 40.5/hour, REM AHI was 58.1 /hour, supine AHI was 44.2. O2 saturation was 96%, with the lowest being 81%. Time spent below 89% saturation equaled 43 minutes.   PROCEDURE:  This is a multichannel digital polysomnogram utilizing the SomnoStar 11.2 system.  Electrodes and sensors were applied and monitored per AASM Specifications.   EEG, EOG, Chin and Limb EMG, were sampled at 200 Hz.  ECG, Snore and Nasal Pressure, Thermal Airflow, Respiratory Effort, CPAP Flow and Pressure, Oximetry was sampled at 50 Hz. Digital video and audio were recorded.      The patient was fitted with a ResMed AirFit P10 with large nasal pillows. CPAP was initiated at 5 cmH20 with heated humidity per AASM split night standards and pressure was advanced to 6cmH20 for snoring, apneas/hypopneas and desaturations.  At a PAP pressure of 6 cmH20, there was a reduction of the AHI to 0 with O2 nadir of 95% with supine REM sleep achieved.   Lights Out was at 21:25 and Lights On at 05:02. Total recording time (TRT) was 457 minutes, with a total sleep time (TST) of 254 minutes. The patient's sleep latency was 77.5 minutes with 76 minutes of wake time after sleep onset. REM latency was 52.5 minutes.  The sleep efficiency was 55.6 %.    SLEEP ARCHITECTURE: WASO (Wake after sleep onset)  was 54.5 minutes with several longer periods of wakefulness and inability to go back to sleep after 03:46 AM.  There were 14 minutes in Stage N1, 97 minutes Stage N2, 96.5 minutes Stage N3 and 46.5 minutes in Stage REM.  The percentage of Stage N1 was 5.5%, Stage N2 was 38.2%,  which is reduced, Stage N3 was 38.%, which is increased, and Stage R (REM sleep) was 18.3%, which is near normal.   The arousals were noted as: 53 were spontaneous, 0 were associated with PLMs, 0 were associated with respiratory events. Audio and video analysis did not show any abnormal or unusual movements, behaviors, phonations or vocalizations. The patient took no bathroom breaks.  EKG was in keeping with normal sinus rhythm (NSR).  RESPIRATORY ANALYSIS:  There was a total of 0 respiratory events: 0 obstructive apneas, 0 central apneas and 0 mixed apneas with a total of 0 apneas and an apnea index (AI) of 0 /hour. There were 0 hypopneas with a hypopnea index of 0/hour. The patient also had 0 respiratory event related arousals (RERAs).      The total APNEA/HYPOPNEA INDEX  (AHI) was 0 /hour and the total RESPIRATORY DISTURBANCE INDEX was 0 .hour  0 events occurred in REM sleep and 0 events in NREM. The REM AHI was 0 /hour versus a non-REM AHI of 0 /hour.  The patient spent 37.5 minutes of total sleep time in the supine position and 217 minutes in non-supine. The supine AHI was 0.0, versus a non-supine AHI of 0.0.  OXYGEN SATURATION & C02:  The baseline 02 saturation was 98%, with the lowest being 93%. Time spent below 89% saturation equaled 0 minutes.  PERIODIC LIMB MOVEMENTS: The patient had  a total of 0 Periodic Limb Movements. The Periodic Limb Movement (PLM) index was 0 and the PLM Arousal index was 0 /hour.    Post-study, the patient indicated that sleep was better than usual.   DIAGNOSIS 1. Obstructive Sleep Apnea  2. Dysfunctions associated with Sleep stages or arousals from sleep    PLANS/RECOMMENDATIONS: 1. This study demonstrates resolution of the patient's obstructive sleep apnea with CPAP therapy. I will, therefore, start the patient on home CPAP treatment at a pressure of 6 cm via large nasal pillows with heated humidity. The patient should be reminded to be fully compliant with PAP  therapy to improve sleep related symptoms and decrease long term cardiovascular risks. The patient should be reminded, that it may take up to 3 months to get fully used to using PAP with all planned sleep. The earlier full compliance is achieved, the better long term compliance tends to be. Please note that untreated obstructive sleep apnea carries additional perioperative morbidity. Patients with significant obstructive sleep apnea should receive perioperative PAP therapy and the surgeons and particularly the anesthesiologist should be informed of the diagnosis and the severity of the sleep disordered breathing. 2. This study shows sleep fragmentation and abnormal sleep stage percentages; these are nonspecific findings and per se do not signify an intrinsic sleep disorder or a cause for the patient's sleep-related symptoms. Causes include (but are not limited to) the first night effect of the sleep study, circadian rhythm disturbances, medication effect or an underlying mood disorder or medical problem.  3. The patient should be cautioned not to drive, work at heights, or operate dangerous or heavy equipment when tired or sleepy. Review and reiteration of good sleep hygiene measures should be pursued with any patient. 4. The patient will be seen in follow-up by Dr. Rexene Alberts at [facility] for discussion of the test results and further management strategies. The referring provider will be notified of the test results.  I certify that I have reviewed the entire raw data recording prior to the issuance of this report in accordance with the Standards of Accreditation of the American Academy of Sleep Medicine (AASM)       Star Age, MD, PhD Diplomat, American Board of Psychiatry and Neurology (Neurology and Sleep Medicine)

## 2016-10-16 ENCOUNTER — Telehealth: Payer: Self-pay

## 2016-10-16 NOTE — Telephone Encounter (Signed)
LM for patient to call back for results

## 2016-10-16 NOTE — Telephone Encounter (Signed)
-----   Message from Star Age, MD sent at 10/12/2016 12:08 PM EDT ----- ER referral for HAs, seen on 09/06/16, PSG on 09/18/16, cpap study on 10/09/16:  Please call and inform patient that I have entered an order for treatment with positive airway pressure (PAP) treatment of obstructive sleep apnea (OSA). She did well during the latest sleep study with CPAP. We will, therefore, arrange for a machine for home use through a DME (durable medical equipment) company of Her choice; and I will see the patient back in follow-up in about 10 weeks. Please also explain to the patient that I will be looking out for compliance data, which can be downloaded from the machine (stored on an SD card, that is inserted in the machine) or via remote access through a modem, that is built into the machine. At the time of the followup appointment we will discuss sleep study results and how it is going with PAP treatment at home. Please advise patient to bring Her machine at the time of the first FU visit, even though this is cumbersome. Bringing the machine for every visit after that will likely not be needed, but often helps for the first visit to troubleshoot if needed. Please re-enforce the importance of compliance with treatment and the need for [place] to monitor compliance data - often an insurance requirement and actually good feedback for the patient as far as how they are doing.  Also remind patient, that any interim PAP machine or mask issues should be first addressed with the DME company, as they can often help better with technical and mask fit issues. Please ask if patient has a preference regarding DME company.  Please also make sure, the patient has a follow-up appointment with me in about 10 weeks from the setup date, thanks.  Once you have spoken to the patient - and faxed/routed report to PCP and referring MD (if other than PCP), you can close this encounter, thanks,   Star Age, MD, PhD [facility]  ([place])

## 2016-10-22 NOTE — Telephone Encounter (Signed)
Patient called back and I gave results and recommendations. She is willing to start treatment. She asked to use [facility] in [place].

## 2016-11-12 DIAGNOSIS — R5383 Other fatigue: Secondary | ICD-10-CM | POA: Diagnosis not present

## 2016-11-12 DIAGNOSIS — R0600 Dyspnea, unspecified: Secondary | ICD-10-CM | POA: Diagnosis not present

## 2016-11-12 DIAGNOSIS — E538 Deficiency of other specified B group vitamins: Secondary | ICD-10-CM | POA: Diagnosis not present

## 2016-11-12 DIAGNOSIS — M25571 Pain in right ankle and joints of right foot: Secondary | ICD-10-CM | POA: Diagnosis not present

## 2016-12-04 ENCOUNTER — Telehealth: Payer: Self-pay

## 2016-12-04 NOTE — Telephone Encounter (Signed)
LM for patient to call back and we need to r/s her appt. She has not been on CPAP 30+ days. I asked her to call back and I can r/s.

## 2016-12-04 NOTE — Telephone Encounter (Signed)
I spoke to patient and asked her to disregard my last message. We can keep appt tomorrow. She is compliant with treatment.

## 2016-12-05 ENCOUNTER — Ambulatory Visit: Payer: Medicare Other | Admitting: Neurology

## 2016-12-07 ENCOUNTER — Encounter: Payer: Self-pay | Admitting: Podiatry

## 2016-12-07 ENCOUNTER — Ambulatory Visit: Payer: Medicare Other

## 2016-12-07 ENCOUNTER — Ambulatory Visit (INDEPENDENT_AMBULATORY_CARE_PROVIDER_SITE_OTHER): Payer: Medicare Other | Admitting: Podiatry

## 2016-12-07 DIAGNOSIS — S93491A Sprain of other ligament of right ankle, initial encounter: Secondary | ICD-10-CM | POA: Diagnosis not present

## 2016-12-07 DIAGNOSIS — M25471 Effusion, right ankle: Secondary | ICD-10-CM | POA: Diagnosis not present

## 2016-12-07 DIAGNOSIS — M25571 Pain in right ankle and joints of right foot: Secondary | ICD-10-CM

## 2016-12-07 MED ORDER — IBUPROFEN 400 MG PO TABS: 400.0000 mg | ORAL_TABLET | Freq: Three times a day (TID) | ORAL | 0 refills | Status: DC | PRN

## 2016-12-10 ENCOUNTER — Encounter: Payer: Self-pay | Admitting: Neurology

## 2016-12-11 ENCOUNTER — Ambulatory Visit (INDEPENDENT_AMBULATORY_CARE_PROVIDER_SITE_OTHER): Payer: Medicare Other | Admitting: Neurology

## 2016-12-11 ENCOUNTER — Encounter: Payer: Self-pay | Admitting: Neurology

## 2016-12-11 VITALS — BP 156/82 | HR 78 | Resp 16 | Ht 66.0 in | Wt 205.0 lb

## 2016-12-11 DIAGNOSIS — G4733 Obstructive sleep apnea (adult) (pediatric): Secondary | ICD-10-CM

## 2016-12-11 DIAGNOSIS — Z9989 Dependence on other enabling machines and devices: Secondary | ICD-10-CM | POA: Diagnosis not present

## 2016-12-11 NOTE — Progress Notes (Signed)
Subjective:    Patient ID: Rachel Crane is a 71 y.o. female.  HPI    Interim history:   Rachel Crane is a 71 year old right-handed woman with an underlying medical history of hypertension, arthritis, and obesity, who presents for follow-up of her recurrent headaches and sleep apnea, after recent sleep study testing. The patient is unaccompanied today. I first met her on 09/06/2016 is a referral from the emergency room, at which time she reported bilateral recurrent headaches, also nocturnal headaches. Given her history of snoring, I suggested she proceed with a sleep study. She had a baseline sleep study, followed by a CPAP titration study. I went over her test results with her in detail today. Her baseline sleep study from 09/18/2016 showed a sleep efficiency of 81%, an increased percentage of stage II sleep, absence of slow-wave sleep and REM sleep was 20.8% with a normal REM latency. Total AHI was elevated at 40.5 per hour, average oxygen saturation was 96%, nadir was 81%. She had noticed significant PLMS. Based on her test results as well as her sleep-related complaints and recurrent headaches I suggested she return for a CPAP titration study, which she had on 10/09/2016. She was fitted with nasal pillows. Sleep efficiency was 55.6%, sleep latency was 77.5 minutes, REM latency was 52.5 minutes. She had an increased percentage of slow-wave sleep and REM sleep was 18.3%. She was titrated from 5 cm of CPAP to 6 cm of CPAP, final AHI was 0 per hour, O2 nadir of 95%, supine REM sleep achieved. Based on her test results I prescribed CPAP therapy for home use.  Today, 12/11/2016 (all dictated new, as well as above notes, some dictation done in note pad or Word, outside of chart, may appear as copied):   I reviewed her CPAP compliance data from 10/29/2016 through 11/27/2016, which is a total of 30 days but technically she had only been on treatment for 28 days but was compliant with a compliance percentage of  70%, average usage of 5 hours and 36 minutes, residual AHI borderline at 6.6, leak acceptable with the 95th percentile at 16.8 L/m on a pressure of 6 cm with EPR of 2. She reports not going to bed until late, usually between 11 PM and MN. Some nights she may not get more than 5 hours of sleep. She took benadryl for the 1st study, not for the 2nd. She feels improved in her sleep quality, and sleep consolidation, HAs much better. She has a stress test scheduled next month, d/t episodes of SOB. No CP. Used to not be able to sleep some night, now better.   The patient's allergies, current medications, family history, past medical history, past social history, past surgical history and problem list were reviewed and updated as appropriate.   Previously (copied from previous notes for reference):   09/06/2016: She presented to the emergency room on 08/30/2016 with a two-week history of severe headache which was primarily bilateral headache, also on the top of her head. I reviewed the emergency room records. She reported a sharp intermittent headache. It was worse with lying down and became throbbing. She had taken some ibuprofen over-the-counter. She did not report much in the way of photophobia. She had no nausea or vomiting or other neurological accompaniment and no history of trauma. She had a head CT without contrast on 08/30/2016 which I reviewed: No acute intracranial abnormalities. In addition, I first only reviewed the images through the PACS system. I did not detect any significant atrophy  or white matter changes. She was treated symptomatically with Tylenol, naproxen, and she declined Reglan. She was discharged to home. She reports that she has not been able to sleep very well. She has intermittent, rare in fact episodes of sharp shooting pains that start in the right occipital area and lasts for only seconds at a time. In addition, she has woken up in the middle of the night with headaches that have been  building up gradually and seemed to be worse when she does not sleep well. She reports significant issues with her sleep since she moved here from [place]. She moved in early November to be closer to her children. She has 1 child in [place], 2 in [place]. She lives in her own home. She has adjusted to her new living environment but still has some anxiety and has not been able to sleep very well. She has a known history of snoring. She has woken herself up with a sense of gasping for air and foreign body sensation in her throat. She goes to bed sometimes as early as 8 PM, he watches TV in her bedroom but turns it off. She has a small dog that sometimes sleeps at the foot and but does not bother her. She lives alone. She is widowed for the past 7 years. Wakeup time varies. She is often up in the early morning hours and cannot go back to sleep. She has had more anxiety and even mild depression type symptoms lately. She is scheduled to see her new primary care provider on March 1. She does not drink caffeine daily, she drinks alcohol very occasionally and does not smoke. She tries to drink water and some juice. She has knee pain at times but denies frank restless leg type symptoms. She has significant nocturia about 3-4 times per average night. She tries to rest during the day but typically does not fall asleep. Epworth sleepiness score is 0 out of 24, fatigue score is 63 out of 63.  Her Past Medical History Is Significant For: Past Medical History:  Diagnosis Date  . Arthritis   . Hypertension     Her Past Surgical History Is Significant For: Past Surgical History:  Procedure Laterality Date  . CHOLECYSTECTOMY      Her Family History Is Significant For: No family history on file.  Her Social History Is Significant For: Social History   Social History  . Marital status: Widowed    Spouse name: N/A  . Number of children: N/A  . Years of education: N/A   Social History Main Topics  . Smoking status:  Never Smoker  . Smokeless tobacco: Never Used  . Alcohol use No  . Drug use: No  . Sexual activity: Not Asked   Other Topics Concern  . None   Social History Narrative  . None    Her Allergies Are:  No Known Allergies:   Her Current Medications Are:  Outpatient Encounter Prescriptions as of 12/11/2016  Medication Sig  . ibuprofen (ADVIL,MOTRIN) 400 MG tablet Take 1 tablet (400 mg total) by mouth every 8 (eight) hours as needed.  Marland Kitchen losartan (COZAAR) 50 MG tablet TK 1 T PO QD  . Multiple Vitamins-Minerals (ONE-A-DAY WOMENS 50 PLUS PO) Take 1 tablet by mouth daily.   . Omega-3 Fatty Acids (FISH OIL) 1000 MG CAPS Take 1,000 mg by mouth daily.  Marland Kitchen PROAIR HFA 108 (90 Base) MCG/ACT inhaler INL 2 PFS PO Q 6 H PRN   No facility-administered  encounter medications on file as of 12/11/2016.   :  Review of Systems:  Out of a complete 14 point review of systems, all are reviewed and negative with the exception of these symptoms as listed below:  Review of Systems  Neurological:       Patient states that she has not had any trouble with her CPAP machine. Reports that sometimes she will fall asleep without putting it on .     Objective:  Neurologic Exam  Physical Exam Physical Examination:   Vitals:   12/11/16 0805  BP: (!) 156/82  Pulse: 78  Resp: 16   General Examination: The patient is a very pleasant 71 y.o. female in no acute distress. She appears well-developed and well-nourished and well groomed.   HEENT: Normocephalic, atraumatic, pupils are equal, round and reactive to light and accommodation. Bilateral cataracts. She has corrective eyeglasses. Extraocular tracking is good without limitation to gaze excursion or nystagmus noted. Normal smooth pursuit is noted. Hearing is grossly intact. Face is symmetric with normal facial animation and normal facial sensation. Speech is clear with no dysarthria noted. There is no hypophonia. There is no lip, neck/head, jaw or voice tremor.  Neck is supple with full range of passive and active motion. There are no carotid bruits on auscultation. Oropharynx exam reveals: mild mouth dryness, adequate dental hygiene and marked airway crowding. Tongue protrudes centrally and palate elevates symmetrically.    Chest: Clear to auscultation without wheezing, rhonchi or crackles noted.  Heart: S1+S2+0, regular and normal without murmurs, rubs or gallops noted.   Abdomen: Soft, non-tender and non-distended with normal bowel sounds appreciated on auscultation.  Extremities: There is trace pitting edema around the ankles.   Skin: Warm and dry without trophic changes noted. There are no varicose veins.  Musculoskeletal: exam reveals no obvious joint deformities, tenderness or joint swelling or erythema, with the exception of mild bilateral knee pain, crepitation noted in left more than right knee.   Neurologically:  Mental status: The patient is awake, alert and oriented in all 4 spheres. Her immediate and remote memory, attention, language skills and fund of knowledge are appropriate. There is no evidence of aphasia, agnosia, apraxia or anomia. Speech is clear with normal prosody and enunciation. Thought process is linear. Mood is normal and affect is anxious. Cranial nerves II - XII are as described above under HEENT exam. In addition: shoulder shrug is normal with equal shoulder height noted. Motor exam: Normal bulk, strength and tone is noted. There is no drift, tremor or rebound. Romberg is negative. Reflexes are 2+ throughout. Fine motor skills and coordination: grossly intact.  Cerebellar testing: No dysmetria or intention tremor on finger to nose testing. Heel to shin is unremarkable bilaterally. There is no truncal or gait ataxia.  Sensory exam: intact to light touch in the upper and lower extremities.  Gait, station and balance: She stands easily. No veering to one side is noted. No leaning to one side is noted. Posture is  age-appropriate and stance is narrow based. Gait shows normal stride length and normal pace. No problems turning are noted. Tandem walk is unremarkable for age.    Assessment and Plan:  In summary, Rachel Crane is a very pleasant 71 year old female with an underlying medical history of hypertension, arthritis, and obesity, who presents for FU of her recurrent headaches and sleep apnea. She had a baseline sleep study on 09/18/16, followed by a CPAP titration study on 10/09/2016. I went over her sleep study results  with her today. She had severe sleep apnea based on an AHI of over 40 per hour. O2 nadir was 81%. She is adjusting to CPAP therapy and has been compliant with 70% compliance noted in mid April through mid May. She is commended for her CPAP adherence and encouraged to be fully compliant with treatment. She is sleeping better, indicates no significant headaches any longer, feels improved in her sleep quality and daytime energy. She is advised that I would like to increase her CPAP pressure to 7 cm for improvement of her residual AHI which is currently above 5 per hour. She is in agreement. I suggested a six-month follow-up with one of her nurse practitioners. We reviewed her compliance data together as well as her test results in detail today. Physical exam is stable. I answered all her questions today and she was in agreement with the plan. She had a recent head CT which was benign.  I spent 25 minutes in total face-to-face time with the patient, more than 50% of which was spent in counseling and coordination of care, reviewing test results, reviewing medication and discussing or reviewing the diagnosis of recurrent HAs, OSA, the prognosis and treatment options. Pertinent laboratory and imaging test results that were available during this visit with the patient were reviewed by me and considered in my medical decision making (see chart for details).

## 2016-12-11 NOTE — Patient Instructions (Addendum)
Please continue using your CPAP regularly. While your insurance requires that you use CPAP at least 4 hours each night on 70% of the nights, I recommend, that you not skip any nights and use it throughout the night if you can. Getting used to CPAP and staying with the treatment long term does take time and patience and discipline. Untreated obstructive sleep apnea when it is moderate to severe can have an adverse impact on cardiovascular health and raise her risk for heart disease, arrhythmias, hypertension, congestive heart failure, stroke and diabetes. Untreated obstructive sleep apnea causes sleep disruption, nonrestorative sleep, and sleep deprivation. This can have an impact on your day to day functioning and cause daytime sleepiness and impairment of cognitive function, memory loss, mood disturbance, and problems focussing. Using CPAP regularly can improve these symptoms.  Please remember to try to maintain good sleep hygiene, which means: Keep a regular sleep and wake schedule, try not to exercise or have a meal within 2 hours of your bedtime, try to keep your bedroom conducive for sleep, that is, cool and dark, without light distractors such as an illuminated alarm clock, and refrain from watching TV right before sleep or in the middle of the night and do not keep the TV or radio on during the night. Also, try not to use or play on electronic devices at bedtime, such as your cell phone, tablet PC or laptop. If you like to read at bedtime on an electronic device, try to dim the background light as much as possible. Do not eat in the middle of the night.   You can try Melatonin at night for sleep: take 1 mg to 3 mg, one to 2 hours before your bedtime. You can go up to 5 mg if needed. It is over the counter and comes in pill form, chewable form and spray, if you prefer.    I will increase your CPAP pressure to 7 cm. Your DME company will be in touch with you.

## 2016-12-13 ENCOUNTER — Telehealth: Payer: Self-pay | Admitting: *Deleted

## 2016-12-13 DIAGNOSIS — S93491A Sprain of other ligament of right ankle, initial encounter: Secondary | ICD-10-CM | POA: Insufficient documentation

## 2016-12-13 NOTE — Telephone Encounter (Signed)
Rachel Crane - [facility] states they need rx and demographics for pt. Faxed required form and demographics to [facility].

## 2016-12-13 NOTE — Progress Notes (Signed)
Subjective:    Patient ID: Rachel Crane, female   DOB: 71 y.o.   MRN: [id number]   HPI 71 year old female presents the office today for concerns of bilateral right ankle pain on the right side. She states ongoing for several months however she just recently removed and the work. While she was in the [place] when physical therapy she states this was helping quite a bit. She has not went to physical therapy and she has been moving on her feet more she has noticed more pain and swelling to the outside aspect of the ankle. She denies any recent injury or trauma to her lower extremities. She denies any redness or warmth. She is able to walk without much pain. She has no other concerns today. Her main objective for [place] was again set up for physical therapy. She denies any nausea tingling. The pain does not wake her up at night. No claudication symptoms.   Review of Systems  All other systems reviewed and are negative.       Objective:  Physical Exam General: AAO x3, NAD  Dermatological: Skin is warm, dry and supple bilateral. Nails x 10 are well manicured; remaining integument appears unremarkable at this time. There are no open sores, no preulcerative lesions, no rash or signs of infection present.  Vascular: Dorsalis Pedis artery and Posterior Tibial artery pedal pulses are 2/4 bilateral with immedate capillary fill time. There is no pain with calf compression, swelling, warmth, erythema.   Neruologic: Grossly intact via light touch bilateral. Vibratory intact via tuning fork bilateral. Protective threshold with Semmes Wienstein monofilament intact to all pedal sites bilateral.   Musculoskeletal: There is localized edema and erythema to the lateral aspect of the right ankle. This mild discomfort on the course the ATFL and to a lesser degree the CFL. There is no pain of the PTF L, syndesmosis, deltoid ligament. There is no area pinpoint bony tenderness or pain the vibratory sensation to the  ankle or foot. Ankle and subtalar range of motion intact. There is no crusting possibly present.   Muscular strength 5/5 in all groups tested bilateral.  Gait: Unassisted, Nonantalgic.      Assessment:     71 year old female with right lateral ankle swelling, likely chronic lateral ankle sprain/tear    Plan:     -Treatment options discussed including all alternatives, risks, and complications -Etiology of symptoms were discussed -Recommended X-ray -Discussed the treatment options as point she does want to get back into physical therapy. A prescription was provided today for [facility]. -I discussed ankle brace but she declined. Dispensed a compression anklet. -Discussed MRI which she was to hold off on this as well. However symptoms continued recommend x-ray, MRI. -Refill ibuprofen for pain.  Celesta Gentile, DPM

## 2016-12-16 ENCOUNTER — Encounter (HOSPITAL_COMMUNITY): Payer: Self-pay | Admitting: Emergency Medicine

## 2016-12-16 ENCOUNTER — Ambulatory Visit (HOSPITAL_COMMUNITY)
Admission: EM | Admit: 2016-12-16 | Discharge: 2016-12-16 | Disposition: A | Discharge status: Home / Self Care | Payer: Medicare Other | Attending: Internal Medicine | Admitting: Internal Medicine

## 2016-12-16 DIAGNOSIS — R21 Rash and other nonspecific skin eruption: Secondary | ICD-10-CM

## 2016-12-16 MED ORDER — HYDROCORTISONE VALERATE 0.2 % EX OINT: 1.0000 "application " | TOPICAL_OINTMENT | Freq: Two times a day (BID) | CUTANEOUS | 0 refills | Status: DC

## 2016-12-16 MED ORDER — PREDNISONE 50 MG PO TABS: 50.0000 mg | ORAL_TABLET | Freq: Every day | ORAL | 0 refills | Status: AC

## 2016-12-16 NOTE — ED Triage Notes (Signed)
Rash for 2 weeks, rash is on arms and legs and back. Rash is itchy

## 2016-12-16 NOTE — Discharge Instructions (Addendum)
Source of rash is unclear, but could be related to something you are getting onto your skin or bug bites.  Not classic for eczema, but may be related.  Handout may have some helpful tips for managing itching.   Use mild soap/water on skin.  Use your triamcinolone (in jar) all over once daily.  Prescriptions for hydrocortisone 2.5% ointment, for itchiest places, and for short course of prednisone sent to the pharmacy.  Ice/cool compresses can also be helpful in managing itching.  Followup with dermatologist to discuss further management options for rash.   Rash does not look like hives or medication reaction, but followup with primary care provider to discuss whether changing losartan to another medicine might be helpful.

## 2016-12-16 NOTE — ED Provider Notes (Signed)
[facility]    CSN: [id number] Arrival date & time: 12/16/16  1418     History   Chief Complaint Chief Complaint  Patient presents with  . Rash    HPI Rachel Crane is a 71 y.o. female. For the last 2 weeks, she has had a lot of difficulty with itching, and some red bumps. These are mostly located over her dorsal arms and inner legs, some on her upper back. The itching is worse when she is in a warm place, or is active and getting heated. She had a similar difficulty last summer, which improved with topical triamcinolone. Daughter has eczema.    HPI  Past Medical History:  Diagnosis Date  . Arthritis   . Hypertension     Patient Active Problem List   Diagnosis Date Noted  . Sprain of anterior talofibular ligament of right ankle 12/13/2016    Past Surgical History:  Procedure Laterality Date  . CHOLECYSTECTOMY       Home Medications    Prior to Admission medications   Medication Sig Start Date End Date Taking? Authorizing Provider  hydrocortisone valerate ointment (WESTCORT) 0.2 % Apply 1 application topically 2 (two) times daily. 12/16/16   Sherlene Shams, MD  ibuprofen (ADVIL,MOTRIN) 400 MG tablet Take 1 tablet (400 mg total) by mouth every 8 (eight) hours as needed. 12/07/16   Trula Slade, DPM  losartan (COZAAR) 50 MG tablet TK 1 T PO QD 11/14/16   [provider]  Multiple Vitamins-Minerals (ONE-A-DAY WOMENS 50 PLUS PO) Take 1 tablet by mouth daily.     [provider]  Omega-3 Fatty Acids (FISH OIL) 1000 MG CAPS Take 1,000 mg by mouth daily.    [provider]  predniSONE (DELTASONE) 50 MG tablet Take 1 tablet (50 mg total) by mouth daily. 12/16/16 12/21/16  Sherlene Shams, MD  PROAIR HFA 108 [phone] Base) MCG/ACT inhaler INL 2 PFS PO Q 6 H PRN 11/20/16   [provider]    Family History No family history on file.  Social History Social History  Substance Use Topics  . Smoking status: Never Smoker  . Smokeless  tobacco: Never Used  . Alcohol use No     Allergies   Patient has no known allergies.   Review of Systems Review of Systems  All other systems reviewed and are negative.    Physical Exam Triage Vital Signs ED Triage Vitals [12/16/16 1524]  Enc Vitals Group     BP (!) 141/64     Pulse Rate 64     Resp 18     Temp 98.2 F (36.8 C)     Temp Source Oral     SpO2 99 %     Weight      Height      Pain Score      Pain Loc    Updated Vital Signs BP (!) 141/64 (BP Location: Right Arm)   Pulse 64   Temp 98.2 F (36.8 C) (Oral)   Resp 18   SpO2 99%   Physical Exam  Constitutional: She is oriented to person, place, and time. No distress.  HENT:  Head: Atraumatic.  Eyes:  Conjugate gaze observed, no eye redness/discharge  Neck: Neck supple.  Cardiovascular: Normal rate.   Pulmonary/Chest: No respiratory distress.  Abdominal: She exhibits no distension.  Musculoskeletal: Normal range of motion.  Neurological: She is alert and oriented to person, place, and time.  Skin: Skin is warm and  dry.  Sparsely scattered small red papules over her dorsal forearms, medial knees, 1 on the right upper back. No pustules, no crusts. Not excoriated. No wheal and flare  Nursing note and vitals reviewed.    UC Treatments / Results   Procedures Procedures (including critical care time) None today  Final Clinical Impressions(s) / UC Diagnoses   Final diagnoses:  Rash and nonspecific skin eruption   Source of rash is unclear, but could be related to something you are getting onto your skin or bug bites.  Not classic for eczema, but may be related.  Handout may have some helpful tips for managing itching.   Use mild soap/water on skin.  Use your triamcinolone (in jar) all over once daily.  Prescriptions for hydrocortisone 2.5% ointment, for itchiest places, and for short course of prednisone sent to the pharmacy.  Ice/cool compresses can also be helpful in managing itching.  Followup  with dermatologist to discuss further management options for rash.   Rash does not look like hives or medication reaction, but followup with primary care provider to discuss whether changing losartan to another medicine might be helpful.    New Prescriptions New Prescriptions   HYDROCORTISONE VALERATE OINTMENT (WESTCORT) 0.2 %    Apply 1 application topically 2 (two) times daily.   PREDNISONE (DELTASONE) 50 MG TABLET    Take 1 tablet (50 mg total) by mouth daily.     Sherlene Shams, MD 12/17/16 1041

## 2016-12-19 DIAGNOSIS — M25561 Pain in right knee: Secondary | ICD-10-CM | POA: Diagnosis not present

## 2016-12-19 DIAGNOSIS — L299 Pruritus, unspecified: Secondary | ICD-10-CM | POA: Diagnosis not present

## 2016-12-19 DIAGNOSIS — M25562 Pain in left knee: Secondary | ICD-10-CM | POA: Diagnosis not present

## 2016-12-19 DIAGNOSIS — M25511 Pain in right shoulder: Secondary | ICD-10-CM | POA: Diagnosis not present

## 2016-12-19 DIAGNOSIS — M25571 Pain in right ankle and joints of right foot: Secondary | ICD-10-CM | POA: Diagnosis not present

## 2016-12-20 DIAGNOSIS — M25561 Pain in right knee: Secondary | ICD-10-CM | POA: Diagnosis not present

## 2016-12-20 DIAGNOSIS — M25511 Pain in right shoulder: Secondary | ICD-10-CM | POA: Diagnosis not present

## 2016-12-20 DIAGNOSIS — M25562 Pain in left knee: Secondary | ICD-10-CM | POA: Diagnosis not present

## 2016-12-20 DIAGNOSIS — M25571 Pain in right ankle and joints of right foot: Secondary | ICD-10-CM | POA: Diagnosis not present

## 2016-12-21 DIAGNOSIS — I10 Essential (primary) hypertension: Secondary | ICD-10-CM | POA: Diagnosis not present

## 2016-12-21 DIAGNOSIS — Z0189 Encounter for other specified special examinations: Secondary | ICD-10-CM | POA: Diagnosis not present

## 2016-12-21 DIAGNOSIS — R0989 Other specified symptoms and signs involving the circulatory and respiratory systems: Secondary | ICD-10-CM | POA: Diagnosis not present

## 2016-12-21 DIAGNOSIS — I358 Other nonrheumatic aortic valve disorders: Secondary | ICD-10-CM | POA: Diagnosis not present

## 2016-12-27 DIAGNOSIS — M25561 Pain in right knee: Secondary | ICD-10-CM | POA: Diagnosis not present

## 2016-12-27 DIAGNOSIS — M25571 Pain in right ankle and joints of right foot: Secondary | ICD-10-CM | POA: Diagnosis not present

## 2016-12-27 DIAGNOSIS — M25511 Pain in right shoulder: Secondary | ICD-10-CM | POA: Diagnosis not present

## 2016-12-27 DIAGNOSIS — M25562 Pain in left knee: Secondary | ICD-10-CM | POA: Diagnosis not present

## 2016-12-28 DIAGNOSIS — I358 Other nonrheumatic aortic valve disorders: Secondary | ICD-10-CM | POA: Diagnosis not present

## 2016-12-31 DIAGNOSIS — I10 Essential (primary) hypertension: Secondary | ICD-10-CM | POA: Diagnosis not present

## 2016-12-31 DIAGNOSIS — R9430 Abnormal result of cardiovascular function study, unspecified: Secondary | ICD-10-CM | POA: Diagnosis not present

## 2017-01-01 DIAGNOSIS — M25562 Pain in left knee: Secondary | ICD-10-CM | POA: Diagnosis not present

## 2017-01-01 DIAGNOSIS — M25571 Pain in right ankle and joints of right foot: Secondary | ICD-10-CM | POA: Diagnosis not present

## 2017-01-01 DIAGNOSIS — M25511 Pain in right shoulder: Secondary | ICD-10-CM | POA: Diagnosis not present

## 2017-01-01 DIAGNOSIS — M25561 Pain in right knee: Secondary | ICD-10-CM | POA: Diagnosis not present

## 2017-01-04 DIAGNOSIS — M25511 Pain in right shoulder: Secondary | ICD-10-CM | POA: Diagnosis not present

## 2017-01-04 DIAGNOSIS — M25561 Pain in right knee: Secondary | ICD-10-CM | POA: Diagnosis not present

## 2017-01-04 DIAGNOSIS — M25562 Pain in left knee: Secondary | ICD-10-CM | POA: Diagnosis not present

## 2017-01-04 DIAGNOSIS — M25571 Pain in right ankle and joints of right foot: Secondary | ICD-10-CM | POA: Diagnosis not present

## 2017-01-07 ENCOUNTER — Encounter (INDEPENDENT_AMBULATORY_CARE_PROVIDER_SITE_OTHER): Payer: Self-pay | Admitting: Physician Assistant

## 2017-01-07 ENCOUNTER — Ambulatory Visit (INDEPENDENT_AMBULATORY_CARE_PROVIDER_SITE_OTHER): Payer: 59 | Admitting: Physician Assistant

## 2017-01-07 VITALS — BP 109/68 | HR 70 | Temp 98.3°F | Ht 65.0 in | Wt 201.0 lb

## 2017-01-07 DIAGNOSIS — G4733 Obstructive sleep apnea (adult) (pediatric): Secondary | ICD-10-CM

## 2017-01-07 DIAGNOSIS — R21 Rash and other nonspecific skin eruption: Secondary | ICD-10-CM

## 2017-01-07 NOTE — Patient Instructions (Addendum)
Rash A rash is a change in the color of the skin. A rash can also change the way your skin feels. There are many different conditions and factors that can cause a rash. Follow these instructions at home: Pay attention to any changes in your symptoms. Follow these instructions to help with your condition: Medicine Take or apply over-the-counter and prescription medicines only as told by your health care provider. These may include:  Corticosteroid cream.  Anti-itch lotions.  Oral antihistamines.  Skin Care  Apply cool compresses to the affected areas.  Try taking a bath with: ? Epsom salts. Follow the instructions on the packaging. You can get these at your local pharmacy or grocery store. ? Baking soda. Pour a small amount into the bath as told by your health care provider. ? Colloidal oatmeal. Follow the instructions on the packaging. You can get this at your local pharmacy or grocery store.  Try applying baking soda paste to your skin. Stir water into baking soda until it reaches a paste-like consistency.  Do not scratch or rub your skin.  Avoid covering the rash. Make sure the rash is exposed to air as much as possible. General instructions  Avoid hot showers or baths, which can make itching worse. A cold shower may help.  Avoid scented soaps, detergents, and perfumes. Use gentle soaps, detergents, perfumes, and other cosmetic products.  Avoid any substance that causes your rash. Keep a journal to help track what causes your rash. Write down: ? What you eat. ? What cosmetic products you use. ? What you drink. ? What you wear. This includes jewelry.  Keep all follow-up visits as told by your health care provider. This is important. Contact a health care provider if:  You sweat at night.  You lose weight.  You urinate more than normal.  You feel weak.  You vomit.  Your skin or the whites of your eyes look yellow (jaundice).  Your skin: ? Tingles. ? Is  numb.  Your rash: ? Does not go away after several days. ? Gets worse.  You are: ? Unusually thirsty. ? More tired than normal.  You have: ? New symptoms. ? Pain in your abdomen. ? A fever. ? Diarrhea. Get help right away if:  You develop a rash that covers all or most of your body. The rash may or may not be painful.  You develop blisters that: ? Are on top of the rash. ? Grow larger or grow together. ? Are painful. ? Are inside your nose or mouth.  You develop a rash that: ? Looks like purple pinprick-sized spots all over your body. ? Has a "bull's eye" or looks like a target. ? Is not related to sun exposure, is red and painful, and causes your skin to peel. This information is not intended to replace advice given to you by your health care provider. Make sure you discuss any questions you have with your health care provider. Document Released: 06/22/2002 Document Revised: 12/06/2015 Document Reviewed: 11/17/2014 Elsevier Interactive Patient Education  2017 [employer]. Eczema Eczema, also called atopic dermatitis, is a skin disorder that causes inflammation of the skin. It causes a red rash and dry, scaly skin. The skin becomes very itchy. Eczema is generally worse during the cooler winter months and often improves with the warmth of summer. Eczema usually starts showing signs in infancy. Some children outgrow eczema, but it may last through adulthood. What are the causes? The exact cause of eczema is not  known, but it appears to run in families. People with eczema often have a family history of eczema, allergies, asthma, or hay fever. Eczema is not contagious. Flare-ups of the condition may be caused by:  Contact with something you are sensitive or allergic to.  Stress.  What are the signs or symptoms?  Dry, scaly skin.  Red, itchy rash.  Itchiness. This may occur before the skin rash and may be very intense. How is this diagnosed? The diagnosis of eczema is  usually made based on symptoms and medical history. How is this treated? Eczema cannot be cured, but symptoms usually can be controlled with treatment and other strategies. A treatment plan might include:  Controlling the itching and scratching. ? Use over-the-counter antihistamines as directed for itching. This is especially useful at night when the itching tends to be worse. ? Use over-the-counter steroid creams as directed for itching. ? Avoid scratching. Scratching makes the rash and itching worse. It may also result in a skin infection (impetigo) due to a break in the skin caused by scratching.  Keeping the skin well moisturized with creams every day. This will seal in moisture and help prevent dryness. Lotions that contain alcohol and water should be avoided because they can dry the skin.  Limiting exposure to things that you are sensitive or allergic to (allergens).  Recognizing situations that cause stress.  Developing a plan to manage stress.  Follow these instructions at home:  Only take over-the-counter or prescription medicines as directed by your health care provider.  Do not use anything on the skin without checking with your health care provider.  Keep baths or showers short (5 minutes) in warm (not hot) water. Use mild cleansers for bathing. These should be unscented. You may add nonperfumed bath oil to the bath water. It is best to avoid soap and bubble bath.  Immediately after a bath or shower, when the skin is still damp, apply a moisturizing ointment to the entire body. This ointment should be a petroleum ointment. This will seal in moisture and help prevent dryness. The thicker the ointment, the better. These should be unscented.  Keep fingernails cut short. Children with eczema may need to wear soft gloves or mittens at night after applying an ointment.  Dress in clothes made of cotton or cotton blends. Dress lightly, because heat increases itching.  A child with  eczema should stay away from anyone with fever blisters or cold sores. The virus that causes fever blisters (herpes simplex) can cause a serious skin infection in children with eczema. Contact a health care provider if:  Your itching interferes with sleep.  Your rash gets worse or is not better within 1 week after starting treatment.  You see pus or soft yellow scabs in the rash area.  You have a fever.  You have a rash flare-up after contact with someone who has fever blisters. This information is not intended to replace advice given to you by your health care provider. Make sure you discuss any questions you have with your health care provider. Document Released: 06/29/2000 Document Revised: 12/08/2015 Document Reviewed: 02/02/2013 Elsevier Interactive Patient Education  2017 [employer].

## 2017-01-07 NOTE — Progress Notes (Signed)
Subjective:  Patient ID: Rachel Crane, female    DOB: Jul 12, 1946  Age: 71 y.o. MRN: [id number]  CC: f/u rash  HPI Rachel Crane is a 71 y.o. female with a PMH of OSA and HTN presents to establish care. Was recently at the ED on 01/12/17 for a rash. No diagnsosis for the rash but was prescribed a steroid cream. Here from [place] since 9 months ago. Does not know if the change in environment has caused her rash.    Has also been to neurology recently for her OSA. Found to be compliant with CPAP use. Says she is sleeping better overall but thinks that she has an ill fitting mask since two days ago. Says she is awaiting for the replacement parts including filter for her CPAP and mask.           Outpatient Medications Prior to Visit  Medication Sig Dispense Refill  . hydrocortisone valerate ointment (WESTCORT) 0.2 % Apply 1 application topically 2 (two) times daily. 45 g 0  . ibuprofen (ADVIL,MOTRIN) 400 MG tablet Take 1 tablet (400 mg total) by mouth every 8 (eight) hours as needed. 30 tablet 0  . losartan (COZAAR) 50 MG tablet TK 1 T PO QD  1  . Multiple Vitamins-Minerals (ONE-A-DAY WOMENS 50 PLUS PO) Take 1 tablet by mouth daily.     . Omega-3 Fatty Acids (FISH OIL) 1000 MG CAPS Take 1,000 mg by mouth daily.    Marland Kitchen PROAIR HFA 108 (90 Base) MCG/ACT inhaler INL 2 PFS PO Q 6 H PRN  0   No facility-administered medications prior to visit.      ROS Review of Systems  Constitutional: Negative for chills, fever and malaise/fatigue.  Eyes: Negative for blurred vision.  Respiratory: Negative for shortness of breath.   Cardiovascular: Negative for chest pain and palpitations.  Gastrointestinal: Negative for abdominal pain and nausea.  Genitourinary: Negative for dysuria and hematuria.  Musculoskeletal: Negative for joint pain and myalgias.  Skin: Positive for rash.  Neurological: Negative for tingling and headaches.  Psychiatric/Behavioral: Negative for depression. The patient is not nervous/anxious.      Objective:  BP 109/68 (BP Location: Left Arm, Patient Position: Sitting, Cuff Size: Large)   Pulse 70   Temp 98.3 F (36.8 C) (Oral)   Ht 5\' 5"  (1.651 m)   Wt 201 lb (91.2 kg)   SpO2 98%   BMI 33.45 kg/m   BP/Weight 01/07/2017 12/16/2016 7/40/8144  Systolic BP 818 563 149  Diastolic BP 68 64 82  Wt. (Lbs) 201 - 205  BMI 33.45 - 33.09      Physical Exam  Constitutional: She is oriented to person, place, and time.  Well developed, obese, NAD, polite  HENT:  Head: Normocephalic and atraumatic.  Eyes: No scleral icterus.  Neck: Normal range of motion. Neck supple. No thyromegaly present.  Cardiovascular: Normal rate, regular rhythm and normal heart sounds.   Pulmonary/Chest: Effort normal and breath sounds normal.  Musculoskeletal: She exhibits no edema.  Neurological: She is alert and oriented to person, place, and time. No cranial nerve deficit. Coordination normal.  Skin: Skin is warm and dry. No rash noted. No erythema. No pallor.  Psychiatric: She has a normal mood and affect. Her behavior is normal. Thought content normal.  Vitals reviewed.    Assessment & Plan:   1. OSA (obstructive sleep apnea) - Pt is awaiting replacement supplies for her CPAP machine. I have asked that she maintain contact here or with her neurologist in  case she has not received her CPAP supplies.   2. Rash - Allergens, Zone 2 - CBC with Differential - Comprehensive metabolic panel   No orders of the defined types were placed in this encounter.   Follow-up: Return in about 4 weeks (around 02/04/2017) for full physical.   Clent Demark PA

## 2017-01-09 DIAGNOSIS — R0989 Other specified symptoms and signs involving the circulatory and respiratory systems: Secondary | ICD-10-CM | POA: Diagnosis not present

## 2017-01-09 DIAGNOSIS — I10 Essential (primary) hypertension: Secondary | ICD-10-CM | POA: Diagnosis not present

## 2017-01-10 ENCOUNTER — Telehealth (INDEPENDENT_AMBULATORY_CARE_PROVIDER_SITE_OTHER): Payer: Self-pay

## 2017-01-10 ENCOUNTER — Encounter (INDEPENDENT_AMBULATORY_CARE_PROVIDER_SITE_OTHER): Payer: Self-pay

## 2017-01-10 LAB — ALLERGENS, ZONE 2
Alternaria Alternata IgE: <0.1 kU/L
Amer Sycamore IgE Qn: 5.56 kU/L — AB
Aspergillus Fumigatus IgE: 0.13 kU/L — AB
Bahia Grass IgE: 5.58 kU/L — AB
Bermuda Grass IgE: 6.33 kU/L — AB
Cat Dander IgE: 0.11 kU/L — AB
Cedar, Mountain IgE: 3.9 kU/L — AB
Cladosporium Herbarum IgE: <0.1 kU/L
Cockroach, American IgE: 2.9 kU/L — AB
Common Silver Birch IgE: 3.61 kU/L — AB
D Farinae IgE: 62.8 kU/L — AB
D Pteronyssinus IgE: 29.7 kU/L — AB
Dog Dander IgE: 0.34 kU/L — AB
Elm, American IgE: 5.3 kU/L — AB
Hickory, White IgE: 5.44 kU/L — AB
Johnson Grass IgE: 5.73 kU/L — AB
Maple/Box Elder IgE: 5.11 kU/L — AB
Mucor Racemosus IgE: 2.91 kU/L — AB
Mugwort IgE Qn: 4.57 kU/L — AB
Nettle IgE: 4.14 kU/L — AB
Oak, White IgE: 4.98 kU/L — AB
Penicillium Chrysogen IgE: 0.1 kU/L — AB
Pigweed, Rough IgE: 4.8 kU/L — AB
Plantain, English IgE: 5.13 kU/L — AB
Ragweed, Short IgE: 5.79 kU/L — AB
Sheep Sorrel IgE Qn: 6.03 kU/L — AB
Stemphylium Herbarum IgE: 0.17 kU/L — AB
Sweet gum IgE RAST Ql: 4.8 kU/L — AB
Timothy Grass IgE: 5.54 kU/L — AB
White Mulberry IgE: 3.99 kU/L — AB

## 2017-01-10 LAB — CBC WITH DIFFERENTIAL/PLATELET
Basophils Absolute: 0 10*3/uL (ref 0.0–0.2)
Basos: 0 %
EOS (ABSOLUTE): 0.1 10*3/uL (ref 0.0–0.4)
Eos: 1 %
Hematocrit: 38.1 % (ref 34.0–46.6)
Hemoglobin: 11.8 g/dL (ref 11.1–15.9)
Immature Grans (Abs): 0 10*3/uL (ref 0.0–0.1)
Immature Granulocytes: 0 %
Lymphocytes Absolute: 3.1 10*3/uL (ref 0.7–3.1)
Lymphs: 49 %
MCH: 26.6 pg (ref 26.6–33.0)
MCHC: 31 g/dL — ABNORMAL LOW (ref 31.5–35.7)
MCV: 86 fL (ref 79–97)
Monocytes Absolute: 0.3 10*3/uL (ref 0.1–0.9)
Monocytes: 5 %
Neutrophils Absolute: 2.9 10*3/uL (ref 1.4–7.0)
Neutrophils: 45 %
Platelets: 230 10*3/uL (ref 150–379)
RBC: 4.44 x10E6/uL (ref 3.77–5.28)
RDW: 14.3 % (ref 12.3–15.4)
WBC: 6.5 10*3/uL (ref 3.4–10.8)

## 2017-01-10 LAB — COMPREHENSIVE METABOLIC PANEL
ALT: 25 IU/L (ref 0–32)
AST: 21 IU/L (ref 0–40)
Albumin/Globulin Ratio: 1.7 (ref 1.2–2.2)
Albumin: 4.4 g/dL (ref 3.6–4.8)
Alkaline Phosphatase: 87 IU/L (ref 39–117)
BUN/Creatinine Ratio: 17 (ref 12–28)
BUN: 16 mg/dL (ref 8–27)
Bilirubin Total: <0.2 mg/dL (ref 0.0–1.2)
CO2: 26 mmol/L (ref 20–29)
Calcium: 9.5 mg/dL (ref 8.7–10.3)
Chloride: 99 mmol/L (ref 96–106)
Creatinine, Ser: 0.95 mg/dL (ref 0.57–1.00)
GFR calc Af Amer: 71 mL/min/{1.73_m2} (ref >59)
GFR calc non Af Amer: 61 mL/min/{1.73_m2} (ref >59)
Globulin, Total: 2.6 g/dL (ref 1.5–4.5)
Glucose: 117 mg/dL — ABNORMAL HIGH (ref 65–99)
Potassium: 3.5 mmol/L (ref 3.5–5.2)
Sodium: 139 mmol/L (ref 134–144)
Total Protein: 7 g/dL (ref 6.0–8.5)

## 2017-01-10 NOTE — Telephone Encounter (Signed)
Results given to patient, she requested that they be mailed, will mail to patient. Nat Christen, CMA

## 2017-01-11 ENCOUNTER — Telehealth (INDEPENDENT_AMBULATORY_CARE_PROVIDER_SITE_OTHER): Payer: Self-pay | Admitting: Physician Assistant

## 2017-01-11 NOTE — Telephone Encounter (Signed)
Patient requesting referral to [facility]. Nat Christen, CMA

## 2017-01-11 NOTE — Telephone Encounter (Signed)
Patient came into office requesting a refill on triamcinolone cream and hydrocortisone cream. Tempestt Latrelle Dodrill, CMA

## 2017-01-15 ENCOUNTER — Other Ambulatory Visit (INDEPENDENT_AMBULATORY_CARE_PROVIDER_SITE_OTHER): Payer: Self-pay | Admitting: Physician Assistant

## 2017-01-15 DIAGNOSIS — Z889 Allergy status to unspecified drugs, medicaments and biological substances status: Secondary | ICD-10-CM

## 2017-01-15 MED ORDER — CETIRIZINE HCL 10 MG PO TABS: 10.0000 mg | ORAL_TABLET | Freq: Every day | ORAL | 11 refills | Status: DC

## 2017-01-15 NOTE — Telephone Encounter (Signed)
Labs show that she has several allergies in zone 2 of [place] (here). She should be taking a daily antihistamine pill instead of using steroids. I will send out recommendation of taking Zyrtec daily.

## 2017-01-17 DIAGNOSIS — R0602 Shortness of breath: Secondary | ICD-10-CM | POA: Diagnosis not present

## 2017-01-25 ENCOUNTER — Telehealth (INDEPENDENT_AMBULATORY_CARE_PROVIDER_SITE_OTHER): Payer: Self-pay | Admitting: Physician Assistant

## 2017-01-25 ENCOUNTER — Other Ambulatory Visit (INDEPENDENT_AMBULATORY_CARE_PROVIDER_SITE_OTHER): Payer: Self-pay | Admitting: Physician Assistant

## 2017-01-25 DIAGNOSIS — M25511 Pain in right shoulder: Secondary | ICD-10-CM | POA: Diagnosis not present

## 2017-01-25 DIAGNOSIS — M25562 Pain in left knee: Secondary | ICD-10-CM | POA: Diagnosis not present

## 2017-01-25 DIAGNOSIS — M25561 Pain in right knee: Secondary | ICD-10-CM | POA: Diagnosis not present

## 2017-01-25 DIAGNOSIS — M25571 Pain in right ankle and joints of right foot: Secondary | ICD-10-CM | POA: Diagnosis not present

## 2017-01-25 DIAGNOSIS — R21 Rash and other nonspecific skin eruption: Secondary | ICD-10-CM

## 2017-01-25 MED ORDER — HYDROCORTISONE VALERATE 0.2 % EX OINT: 1.0000 "application " | TOPICAL_OINTMENT | Freq: Two times a day (BID) | CUTANEOUS | 0 refills | Status: DC

## 2017-01-25 NOTE — Telephone Encounter (Signed)
Patient requesting refills for Triamcinolone and Hydrocortisone . Stated going to [place] on Monday needs Rx called in to [facility]. On file.  Please follow up with patient.

## 2017-01-25 NOTE — Telephone Encounter (Signed)
Don't see why she needs two steroid creams. I will send one out to her pharmacy.

## 2017-01-25 NOTE — Telephone Encounter (Signed)
FWD TO PCP. Rachel Crane, CMA  

## 2017-02-01 ENCOUNTER — Encounter (INDEPENDENT_AMBULATORY_CARE_PROVIDER_SITE_OTHER): Payer: Self-pay

## 2017-02-18 ENCOUNTER — Ambulatory Visit (INDEPENDENT_AMBULATORY_CARE_PROVIDER_SITE_OTHER): Payer: 59 | Admitting: Physician Assistant

## 2017-02-18 ENCOUNTER — Encounter (INDEPENDENT_AMBULATORY_CARE_PROVIDER_SITE_OTHER): Payer: Self-pay | Admitting: Physician Assistant

## 2017-02-18 VITALS — BP 107/64 | HR 68 | Temp 98.1°F | Wt 204.0 lb

## 2017-02-18 DIAGNOSIS — Z1159 Encounter for screening for other viral diseases: Secondary | ICD-10-CM | POA: Diagnosis not present

## 2017-02-18 DIAGNOSIS — Z0000 Encounter for general adult medical examination without abnormal findings: Secondary | ICD-10-CM

## 2017-02-18 DIAGNOSIS — Z1231 Encounter for screening mammogram for malignant neoplasm of breast: Secondary | ICD-10-CM

## 2017-02-18 DIAGNOSIS — Z1239 Encounter for other screening for malignant neoplasm of breast: Secondary | ICD-10-CM

## 2017-02-18 DIAGNOSIS — Z760 Encounter for issue of repeat prescription: Secondary | ICD-10-CM | POA: Diagnosis not present

## 2017-02-18 DIAGNOSIS — Z23 Encounter for immunization: Secondary | ICD-10-CM

## 2017-02-18 DIAGNOSIS — R21 Rash and other nonspecific skin eruption: Secondary | ICD-10-CM

## 2017-02-18 DIAGNOSIS — E559 Vitamin D deficiency, unspecified: Secondary | ICD-10-CM

## 2017-02-18 MED ORDER — HYDROXYZINE HCL 25 MG PO TABS: 25.0000 mg | ORAL_TABLET | Freq: Every day | ORAL | 0 refills | Status: DC

## 2017-02-18 MED ORDER — LOSARTAN POTASSIUM 100 MG PO TABS: 100.0000 mg | ORAL_TABLET | Freq: Every day | ORAL | 3 refills | Status: DC

## 2017-02-18 MED ORDER — HYDROCORTISONE VALERATE 0.2 % EX OINT
1.0000 | TOPICAL_OINTMENT | Freq: Two times a day (BID) | CUTANEOUS | 0 refills | Status: DC
Start: 2017-02-18 — End: 2017-04-10

## 2017-02-18 NOTE — Progress Notes (Signed)
Subjective:  Patient ID: Rachel Crane, female    DOB: 1945-07-28  Age: 71 y.o. MRN: [id number]  CC: annual exam  HPI Thomasa Heidler is a 71 y.o. female with a PMH of HTN and vitamin D deficiency presents for an annual physical exam. Complains of occasional rash. Had recent zone 2 testing done and was found to have allergies to almost the complete panel. Requests a steroid that "is in a jar", does not know the name. Has chronic bilateral knee pain managed by orthopedics. Otherwise feels well. Does not endorse any other complaints or symptoms.    Outpatient Medications Prior to Visit  Medication Sig Dispense Refill  . cetirizine (ZYRTEC) 10 MG tablet Take 1 tablet (10 mg total) by mouth daily. 30 tablet 11  . chlorthalidone (HYGROTON) 25 MG tablet Take 25 mg by mouth.    . hydrocortisone valerate ointment (WESTCORT) 0.2 % Apply 1 application topically 2 (two) times daily. 45 g 0  . ibuprofen (ADVIL,MOTRIN) 400 MG tablet Take 1 tablet (400 mg total) by mouth every 8 (eight) hours as needed. 30 tablet 0  . losartan (COZAAR) 50 MG tablet TK 1 T PO QD  1  . Multiple Vitamins-Minerals (ONE-A-DAY WOMENS 50 PLUS PO) Take 1 tablet by mouth daily.     . Omega-3 Fatty Acids (FISH OIL) 1000 MG CAPS Take 1,000 mg by mouth daily.    . predniSONE (DELTASONE) 50 MG tablet Take 50 mg by mouth daily with breakfast.    . PROAIR HFA 108 (90 Base) MCG/ACT inhaler INL 2 PFS PO Q 6 H PRN  0  . valsartan (DIOVAN) 320 MG tablet Take 320 mg by mouth daily.     No facility-administered medications prior to visit.      ROS Review of Systems  Constitutional: Positive for malaise/fatigue. Negative for chills and fever.  Eyes: Negative for blurred vision.  Respiratory: Negative for shortness of breath.   Cardiovascular: Negative for chest pain and palpitations.  Gastrointestinal: Negative for abdominal pain and nausea.  Genitourinary: Negative for dysuria and hematuria.  Musculoskeletal: Positive for joint pain.  Negative for myalgias.  Skin: Negative for rash.  Neurological: Negative for tingling and headaches.  Psychiatric/Behavioral: Negative for depression. The patient is not nervous/anxious.     Objective:  BP 107/64 (BP Location: Left Arm, Patient Position: Sitting, Cuff Size: Large)   Pulse 68   Temp 98.1 F (36.7 C) (Oral)   Wt 204 lb (92.5 kg)   SpO2 96%   BMI 33.95 kg/m   BP/Weight 02/18/2017 7/82/9562 07/19/863  Systolic BP 784 696 295  Diastolic BP 64 68 64  Wt. (Lbs) 204 201 -  BMI 33.95 33.45 -      Physical Exam  Constitutional: She is oriented to person, place, and time.  Well developed, well nourished, NAD, polite  HENT:  Head: Normocephalic and atraumatic.  Eyes: No scleral icterus.  Neck: Normal range of motion. Neck supple. No thyromegaly present.  Cardiovascular: Normal rate, regular rhythm and normal heart sounds.   Pulmonary/Chest: Effort normal and breath sounds normal. No respiratory distress.  Abdominal: Soft. Bowel sounds are normal. She exhibits no distension. There is no tenderness.  Genitourinary:  Genitourinary Comments: Pt declined  Musculoskeletal: She exhibits no edema.  Lymphadenopathy:    She has no cervical adenopathy.  Neurological: She is alert and oriented to person, place, and time. She has normal reflexes. No cranial nerve deficit. Coordination normal.  5/5 strength throughout. Gait normal  Skin: Skin is  warm and dry. No rash noted. No erythema. No pallor.  Psychiatric: She has a normal mood and affect. Her behavior is normal. Thought content normal.  Vitals reviewed.    Assessment & Plan:    1. Annual physical exam - CBC with Differential; Future - Comprehensive metabolic panel; Future - Lipid Panel; Future - TSH; Future  2. Rash and nonspecific skin eruption - Ambulatory referral to Allergy - Refill hydrocortisone valerate ointment (WESTCORT) 0.2 %; Apply 1 application topically 2 (two) times daily.  Dispense: 45 g; Refill:  0 - Begin hydrOXYzine (ATARAX/VISTARIL) 25 MG tablet; Take 1 tablet (25 mg total) by mouth at bedtime.  Dispense: 30 tablet; Refill: 0  3. Need for hepatitis C screening test - Hepatitis c antibody (reflex); Future  4. Vitamin D deficiency - Vit D 1,25 - Bone density ordered but rejected by Medicare. I have asked patient to call and ask the reason she was denied coverage. Perhaps she may have had one in [place] recently.   5. Need for Tdap vaccination - Administered Tdap in clinic today  6. Need for pneumococcal vaccination - Pneumococcal conjugate vaccine 13-valent  7. Screening for breast cancer - [place] DIGITAL SCREENING BILATERAL; Future  8. Medication refill - Stop Valsartan 320 mg  - Start Losartan 100 mg   Meds ordered this encounter  Medications  . hydrocortisone valerate ointment (WESTCORT) 0.2 %    Sig: Apply 1 application topically 2 (two) times daily.    Dispense:  45 g    Refill:  0    Order Specific Question:   Supervising Provider    Answer:   Tresa Garter [id number]  . hydrOXYzine (ATARAX/VISTARIL) 25 MG tablet    Sig: Take 1 tablet (25 mg total) by mouth at bedtime.    Dispense:  30 tablet    Refill:  0    Order Specific Question:   Supervising Provider    Answer:   Tresa Garter [[id number]]    Follow-up: 4 months for HTN  Clent Demark PA

## 2017-02-18 NOTE — Patient Instructions (Signed)
Bone Densitometry Bone densitometry is an imaging test that uses a special X-ray to measure the amount of calcium and other minerals in your bones (bone density). This test is also known as a bone mineral density test or dual-energy X-ray absorptiometry (DXA). The test can measure bone density at your hip and your spine. It is similar to having a regular X-ray. You may have this test to:  Diagnose a condition that causes weak or thin bones (osteoporosis).  Predict your risk of a broken bone (fracture).  Determine how well osteoporosis treatment is working.  Tell a health care provider about:  Any allergies you have.  All medicines you are taking, including vitamins, herbs, eye drops, creams, and over-the-counter medicines.  Any problems you or family members have had with anesthetic medicines.  Any blood disorders you have.  Any surgeries you have had.  Any medical conditions you have.  Possibility of pregnancy.  Any other medical test you had within the previous 14 days that used contrast material. What are the risks? Generally, this is a safe procedure. However, problems can occur and may include the following:  This test exposes you to a very small amount of radiation.  The risks of radiation exposure may be greater to unborn children.  What happens before the procedure?  Do not take any calcium supplements for 24 hours before having the test. You can otherwise eat and drink what you usually do.  Take off all metal jewelry, eyeglasses, dental appliances, and any other metal objects. What happens during the procedure?  You may lie on an exam table. There will be an X-ray generator below you and an imaging device above you.  Other devices, such as boxes or braces, may be used to position your body properly for the scan.  You will need to lie still while the machine slowly scans your body.  The images will show up on a computer monitor. What happens after the  procedure? You may need more testing at a later time. This information is not intended to replace advice given to you by your health care provider. Make sure you discuss any questions you have with your health care provider. Document Released: 07/24/2004 Document Revised: 12/08/2015 Document Reviewed: 12/10/2013 Elsevier Interactive Patient Education  2018 [employer] Inc[employer]   

## 2017-02-19 ENCOUNTER — Telehealth (INDEPENDENT_AMBULATORY_CARE_PROVIDER_SITE_OTHER): Payer: Self-pay | Admitting: Physician Assistant

## 2017-02-19 NOTE — Telephone Encounter (Signed)
Pt is requesting an Allergy Medicine please sent [place]  . Please, call her  Thank you

## 2017-02-20 DIAGNOSIS — M25561 Pain in right knee: Secondary | ICD-10-CM | POA: Diagnosis not present

## 2017-02-20 DIAGNOSIS — M25562 Pain in left knee: Secondary | ICD-10-CM | POA: Diagnosis not present

## 2017-02-20 DIAGNOSIS — M25511 Pain in right shoulder: Secondary | ICD-10-CM | POA: Diagnosis not present

## 2017-02-20 DIAGNOSIS — M25571 Pain in right ankle and joints of right foot: Secondary | ICD-10-CM | POA: Diagnosis not present

## 2017-02-22 ENCOUNTER — Ambulatory Visit
Admission: RE | Admit: 2017-02-22 | Discharge: 2017-02-22 | Disposition: A | Discharge status: Home / Self Care | Payer: Medicare Other | Source: Ambulatory Visit | Attending: Physician Assistant | Admitting: Physician Assistant

## 2017-02-22 ENCOUNTER — Other Ambulatory Visit (INDEPENDENT_AMBULATORY_CARE_PROVIDER_SITE_OTHER): Payer: Medicare Other

## 2017-02-22 DIAGNOSIS — M25562 Pain in left knee: Secondary | ICD-10-CM | POA: Diagnosis not present

## 2017-02-22 DIAGNOSIS — M25571 Pain in right ankle and joints of right foot: Secondary | ICD-10-CM | POA: Diagnosis not present

## 2017-02-22 DIAGNOSIS — Z0000 Encounter for general adult medical examination without abnormal findings: Secondary | ICD-10-CM

## 2017-02-22 DIAGNOSIS — M25561 Pain in right knee: Secondary | ICD-10-CM | POA: Diagnosis not present

## 2017-02-22 DIAGNOSIS — M25511 Pain in right shoulder: Secondary | ICD-10-CM | POA: Diagnosis not present

## 2017-02-22 DIAGNOSIS — Z1159 Encounter for screening for other viral diseases: Secondary | ICD-10-CM | POA: Diagnosis not present

## 2017-02-22 DIAGNOSIS — Z1239 Encounter for other screening for malignant neoplasm of breast: Secondary | ICD-10-CM

## 2017-02-22 DIAGNOSIS — Z1231 Encounter for screening mammogram for malignant neoplasm of breast: Secondary | ICD-10-CM | POA: Diagnosis not present

## 2017-02-23 LAB — CBC WITH DIFFERENTIAL/PLATELET
Basophils Absolute: 0 10*3/uL (ref 0.0–0.2)
Basos: 0 %
EOS (ABSOLUTE): 0.1 10*3/uL (ref 0.0–0.4)
Eos: 2 %
Hematocrit: 36.2 % (ref 34.0–46.6)
Hemoglobin: 11.4 g/dL (ref 11.1–15.9)
Immature Grans (Abs): 0 10*3/uL (ref 0.0–0.1)
Immature Granulocytes: 0 %
Lymphocytes Absolute: 2.8 10*3/uL (ref 0.7–3.1)
Lymphs: 44 %
MCH: 27.1 pg (ref 26.6–33.0)
MCHC: 31.5 g/dL (ref 31.5–35.7)
MCV: 86 fL (ref 79–97)
Monocytes Absolute: 0.5 10*3/uL (ref 0.1–0.9)
Monocytes: 8 %
Neutrophils Absolute: 2.8 10*3/uL (ref 1.4–7.0)
Neutrophils: 46 %
Platelets: 228 10*3/uL (ref 150–379)
RBC: 4.2 x10E6/uL (ref 3.77–5.28)
RDW: 14.9 % (ref 12.3–15.4)
WBC: 6.3 10*3/uL (ref 3.4–10.8)

## 2017-02-23 LAB — COMPREHENSIVE METABOLIC PANEL
ALT: 19 IU/L (ref 0–32)
AST: 19 IU/L (ref 0–40)
Albumin/Globulin Ratio: 1.6 (ref 1.2–2.2)
Albumin: 4.3 g/dL (ref 3.6–4.8)
Alkaline Phosphatase: 79 IU/L (ref 39–117)
BUN/Creatinine Ratio: 14 (ref 12–28)
BUN: 14 mg/dL (ref 8–27)
Bilirubin Total: 0.3 mg/dL (ref 0.0–1.2)
CO2: 24 mmol/L (ref 20–29)
Calcium: 9.3 mg/dL (ref 8.7–10.3)
Chloride: 102 mmol/L (ref 96–106)
Creatinine, Ser: 1.03 mg/dL — ABNORMAL HIGH (ref 0.57–1.00)
GFR calc Af Amer: 64 mL/min/{1.73_m2} (ref >59)
GFR calc non Af Amer: 56 mL/min/{1.73_m2} — ABNORMAL LOW (ref >59)
Globulin, Total: 2.7 g/dL (ref 1.5–4.5)
Glucose: 98 mg/dL (ref 65–99)
Potassium: 3.6 mmol/L (ref 3.5–5.2)
Sodium: 142 mmol/L (ref 134–144)
Total Protein: 7 g/dL (ref 6.0–8.5)

## 2017-02-23 LAB — LIPID PANEL
Chol/HDL Ratio: 3.9 ratio (ref 0.0–4.4)
Cholesterol, Total: 189 mg/dL (ref 100–199)
HDL: 49 mg/dL (ref >39)
LDL Calculated: 124 mg/dL — ABNORMAL HIGH (ref 0–99)
Triglycerides: 78 mg/dL (ref 0–149)
VLDL Cholesterol Cal: 16 mg/dL (ref 5–40)

## 2017-02-23 LAB — HEPATITIS C ANTIBODY (REFLEX): HCV Ab: 0.2 s/co ratio (ref 0.0–0.9)

## 2017-02-23 LAB — TSH: TSH: 1.14 u[IU]/mL (ref 0.450–4.500)

## 2017-02-23 LAB — HCV COMMENT:

## 2017-02-25 ENCOUNTER — Other Ambulatory Visit (INDEPENDENT_AMBULATORY_CARE_PROVIDER_SITE_OTHER): Payer: Self-pay | Admitting: Physician Assistant

## 2017-02-25 DIAGNOSIS — M25562 Pain in left knee: Secondary | ICD-10-CM | POA: Diagnosis not present

## 2017-02-25 DIAGNOSIS — M25571 Pain in right ankle and joints of right foot: Secondary | ICD-10-CM | POA: Diagnosis not present

## 2017-02-25 DIAGNOSIS — M25511 Pain in right shoulder: Secondary | ICD-10-CM | POA: Diagnosis not present

## 2017-02-25 DIAGNOSIS — E785 Hyperlipidemia, unspecified: Secondary | ICD-10-CM

## 2017-02-25 DIAGNOSIS — M25561 Pain in right knee: Secondary | ICD-10-CM | POA: Diagnosis not present

## 2017-02-25 MED ORDER — LOVASTATIN 20 MG PO TABS: 20.0000 mg | ORAL_TABLET | Freq: Every day | ORAL | 3 refills | Status: DC

## 2017-02-28 DIAGNOSIS — I10 Essential (primary) hypertension: Secondary | ICD-10-CM | POA: Diagnosis not present

## 2017-02-28 DIAGNOSIS — R9439 Abnormal result of other cardiovascular function study: Secondary | ICD-10-CM | POA: Diagnosis not present

## 2017-02-28 DIAGNOSIS — Z0189 Encounter for other specified special examinations: Secondary | ICD-10-CM | POA: Diagnosis not present

## 2017-02-28 DIAGNOSIS — I358 Other nonrheumatic aortic valve disorders: Secondary | ICD-10-CM | POA: Diagnosis not present

## 2017-03-01 DIAGNOSIS — M25562 Pain in left knee: Secondary | ICD-10-CM | POA: Diagnosis not present

## 2017-03-01 DIAGNOSIS — M25561 Pain in right knee: Secondary | ICD-10-CM | POA: Diagnosis not present

## 2017-03-01 DIAGNOSIS — M25511 Pain in right shoulder: Secondary | ICD-10-CM | POA: Diagnosis not present

## 2017-03-01 DIAGNOSIS — M25571 Pain in right ankle and joints of right foot: Secondary | ICD-10-CM | POA: Diagnosis not present

## 2017-03-25 ENCOUNTER — Other Ambulatory Visit (INDEPENDENT_AMBULATORY_CARE_PROVIDER_SITE_OTHER): Payer: Self-pay | Admitting: Physician Assistant

## 2017-03-25 ENCOUNTER — Telehealth (INDEPENDENT_AMBULATORY_CARE_PROVIDER_SITE_OTHER): Payer: Self-pay | Admitting: Physician Assistant

## 2017-03-25 DIAGNOSIS — I10 Essential (primary) hypertension: Secondary | ICD-10-CM

## 2017-03-25 MED ORDER — CHLORTHALIDONE 25 MG PO TABS: 25.0000 mg | ORAL_TABLET | Freq: Every day | ORAL | 3 refills | Status: DC

## 2017-03-25 NOTE — Telephone Encounter (Signed)
Patient called requesting chlorthalidone (HYGROTON) 25 MG tablet  She don't have no pills  Patient use [facility] at [address] . Please, call her when is ready so she can go to the pharmacy

## 2017-03-25 NOTE — Telephone Encounter (Signed)
I sent out medication.

## 2017-03-25 NOTE — Telephone Encounter (Signed)
Patient refill request for hygroton. Please advise.

## 2017-04-10 ENCOUNTER — Ambulatory Visit (INDEPENDENT_AMBULATORY_CARE_PROVIDER_SITE_OTHER): Payer: Medicare Other | Admitting: Physician Assistant

## 2017-04-10 ENCOUNTER — Encounter (INDEPENDENT_AMBULATORY_CARE_PROVIDER_SITE_OTHER): Payer: Self-pay | Admitting: Physician Assistant

## 2017-04-10 VITALS — BP 124/74 | HR 60 | Temp 98.6°F | Resp 16 | Ht 66.0 in | Wt 205.6 lb

## 2017-04-10 DIAGNOSIS — R52 Pain, unspecified: Secondary | ICD-10-CM

## 2017-04-10 DIAGNOSIS — J069 Acute upper respiratory infection, unspecified: Secondary | ICD-10-CM

## 2017-04-10 DIAGNOSIS — F4323 Adjustment disorder with mixed anxiety and depressed mood: Secondary | ICD-10-CM | POA: Diagnosis not present

## 2017-04-10 DIAGNOSIS — R5383 Other fatigue: Secondary | ICD-10-CM | POA: Diagnosis not present

## 2017-04-10 LAB — POCT URINALYSIS DIPSTICK
Bilirubin, UA: NEGATIVE
Blood, UA: NEGATIVE
Glucose, UA: NEGATIVE
Ketones, UA: NEGATIVE
Leukocytes, UA: NEGATIVE
Nitrite, UA: NEGATIVE
Protein, UA: NEGATIVE
Spec Grav, UA: 1.015 (ref 1.010–1.025)
Urobilinogen, UA: 0.2 E.U./dL
pH, UA: 5 (ref 5.0–8.0)

## 2017-04-10 MED ORDER — ESCITALOPRAM OXALATE 10 MG PO TABS: 10.0000 mg | ORAL_TABLET | Freq: Every day | ORAL | 2 refills | Status: DC

## 2017-04-10 MED ORDER — FLUTICASONE PROPIONATE 50 MCG/ACT NA SUSP: 2.0000 | Freq: Every day | NASAL | 6 refills | Status: DC

## 2017-04-10 MED ORDER — CLONAZEPAM 0.25 MG PO TBDP: 0.2500 mg | ORAL_TABLET | Freq: Every day | ORAL | 0 refills | Status: DC

## 2017-04-10 MED ORDER — DM-APAP-CPM 15-500-2 MG PO TABS: 2.0000 | ORAL_TABLET | Freq: Four times a day (QID) | ORAL | 0 refills | Status: DC

## 2017-04-10 NOTE — Patient Instructions (Signed)
Generalized Anxiety Disorder, Adult Generalized anxiety disorder (GAD) is a mental health disorder. People with this condition constantly worry about everyday events. Unlike normal anxiety, worry related to GAD is not triggered by a specific event. These worries also do not fade or get better with time. GAD interferes with life functions, including relationships, work, and school. GAD can vary from mild to severe. People with severe GAD can have intense waves of anxiety with physical symptoms (panic attacks). What are the causes? The exact cause of GAD is not known. What increases the risk? This condition is more likely to develop in:  Women.  People who have a family history of anxiety disorders.  People who are very shy.  People who experience very stressful life events, such as the death of a loved one.  People who have a very stressful family environment.  What are the signs or symptoms? People with GAD often worry excessively about many things in their lives, such as their health and family. They may also be overly concerned about:  Doing well at work.  Being on time.  Natural disasters.  Friendships.  Physical symptoms of GAD include:  Fatigue.  Muscle tension or having muscle twitches.  Trembling or feeling shaky.  Being easily startled.  Feeling like your heart is pounding or racing.  Feeling out of breath or like you cannot take a deep breath.  Having trouble falling asleep or staying asleep.  Sweating.  Nausea, diarrhea, or irritable bowel syndrome (IBS).  Headaches.  Trouble concentrating or remembering facts.  Restlessness.  Irritability.  How is this diagnosed? Your health care provider can diagnose GAD based on your symptoms and medical history. You will also have a physical exam. The health care provider will ask specific questions about your symptoms, including how severe they are, when they started, and if they come and go. Your health care  provider may ask you about your use of alcohol or drugs, including prescription medicines. Your health care provider may refer you to a mental health specialist for further evaluation. Your health care provider will do a thorough examination and may perform additional tests to rule out other possible causes of your symptoms. To be diagnosed with GAD, a person must have anxiety that:  Is out of his or her control.  Affects several different aspects of his or her life, such as work and relationships.  Causes distress that makes him or her unable to take part in normal activities.  Includes at least three physical symptoms of GAD, such as restlessness, fatigue, trouble concentrating, irritability, muscle tension, or sleep problems.  Before your health care provider can confirm a diagnosis of GAD, these symptoms must be present more days than they are not, and they must last for six months or longer. How is this treated? The following therapies are usually used to treat GAD:  Medicine. Antidepressant medicine is usually prescribed for long-term daily control. Antianxiety medicines may be added in severe cases, especially when panic attacks occur.  Talk therapy (psychotherapy). Certain types of talk therapy can be helpful in treating GAD by providing support, education, and guidance. Options include: ? Cognitive behavioral therapy (CBT). People learn coping skills and techniques to ease their anxiety. They learn to identify unrealistic or negative thoughts and behaviors and to replace them with positive ones. ? Acceptance and commitment therapy (ACT). This treatment teaches people how to be mindful as a way to cope with unwanted thoughts and feelings. ? Biofeedback. This process trains you to   manage your body's response (physiological response) through breathing techniques and relaxation methods. You will work with a therapist while machines are used to monitor your physical symptoms.  Stress  management techniques. These include yoga, meditation, and exercise.  A mental health specialist can help determine which treatment is best for you. Some people see improvement with one type of therapy. However, other people require a combination of therapies. Follow these instructions at home:  Take over-the-counter and prescription medicines only as told by your health care provider.  Try to maintain a normal routine.  Try to anticipate stressful situations and allow extra time to manage them.  Practice any stress management or self-calming techniques as taught by your health care provider.  Do not punish yourself for setbacks or for not making progress.  Try to recognize your accomplishments, even if they are small.  Keep all follow-up visits as told by your health care provider. This is important. Contact a health care provider if:  Your symptoms do not get better.  Your symptoms get worse.  You have signs of depression, such as: ? A persistently sad, cranky, or irritable mood. ? Loss of enjoyment in activities that used to bring you joy. ? Change in weight or eating. ? Changes in sleeping habits. ? Avoiding friends or family members. ? Loss of energy for normal tasks. ? Feelings of guilt or worthlessness. Get help right away if:  You have serious thoughts about hurting yourself or others. If you ever feel like you may hurt yourself or others, or have thoughts about taking your own life, get help right away. You can go to your nearest emergency department or call:  Your local emergency services (911 in the [place]  A suicide crisis helpline, such as the [facility] at 561-025-9844. This is open 24 hours a day.  Summary  Generalized anxiety disorder (GAD) is a mental health disorder that involves worry that is not triggered by a specific event.  People with GAD often worry excessively about many things in their lives, such as their health and  family.  GAD may cause physical symptoms such as restlessness, trouble concentrating, sleep problems, frequent sweating, nausea, diarrhea, headaches, and trembling or muscle twitching.  A mental health specialist can help determine which treatment is best for you. Some people see improvement with one type of therapy. However, other people require a combination of therapies. This information is not intended to replace advice given to you by your health care provider. Make sure you discuss any questions you have with your health care provider. Document Released: 10/27/2012 Document Revised: 05/22/2016 Document Reviewed: 05/22/2016 Elsevier Interactive Patient Education  2018 [employer].    Upper Respiratory Infection, Adult Most upper respiratory infections (URIs) are a viral infection of the air passages leading to the lungs. A URI affects the nose, throat, and upper air passages. The most common type of URI is nasopharyngitis and is typically referred to as "the common cold." URIs run their course and usually go away on their own. Most of the time, a URI does not require medical attention, but sometimes a bacterial infection in the upper airways can follow a viral infection. This is called a secondary infection. Sinus and middle ear infections are common types of secondary upper respiratory infections. Bacterial pneumonia can also complicate a URI. A URI can worsen asthma and chronic obstructive pulmonary disease (COPD). Sometimes, these complications can require emergency medical care and may be life threatening. What are the causes? Almost  all URIs are caused by viruses. A virus is a type of germ and can spread from one person to another. What increases the risk? You may be at risk for a URI if:  You smoke.  You have chronic heart or lung disease.  You have a weakened defense (immune) system.  You are very young or very old.  You have nasal allergies or asthma.  You work in crowded  or poorly ventilated areas.  You work in health care facilities or schools.  What are the signs or symptoms? Symptoms typically develop 2-3 days after you come in contact with a cold virus. Most viral URIs last 7-10 days. However, viral URIs from the influenza virus (flu virus) can last 14-18 days and are typically more severe. Symptoms may include:  Runny or stuffy (congested) nose.  Sneezing.  Cough.  Sore throat.  Headache.  Fatigue.  Fever.  Loss of appetite.  Pain in your forehead, behind your eyes, and over your cheekbones (sinus pain).  Muscle aches.  How is this diagnosed? Your health care provider may diagnose a URI by:  Physical exam.  Tests to check that your symptoms are not due to another condition such as: ? Strep throat. ? Sinusitis. ? Pneumonia. ? Asthma.  How is this treated? A URI goes away on its own with time. It cannot be cured with medicines, but medicines may be prescribed or recommended to relieve symptoms. Medicines may help:  Reduce your fever.  Reduce your cough.  Relieve nasal congestion.  Follow these instructions at home:  Take medicines only as directed by your health care provider.  Gargle warm saltwater or take cough drops to comfort your throat as directed by your health care provider.  Use a warm mist humidifier or inhale steam from a shower to increase air moisture. This may make it easier to breathe.  Drink enough fluid to keep your urine clear or pale yellow.  Eat soups and other clear broths and maintain good nutrition.  Rest as needed.  Return to work when your temperature has returned to normal or as your health care provider advises. You may need to stay home longer to avoid infecting others. You can also use a face mask and careful hand washing to prevent spread of the virus.  Increase the usage of your inhaler if you have asthma.  Do not use any tobacco products, including cigarettes, chewing tobacco, or  electronic cigarettes. If you need help quitting, ask your health care provider. How is this prevented? The best way to protect yourself from getting a cold is to practice good hygiene.  Avoid oral or hand contact with people with cold symptoms.  Wash your hands often if contact occurs.  There is no clear evidence that vitamin C, vitamin E, echinacea, or exercise reduces the chance of developing a cold. However, it is always recommended to get plenty of rest, exercise, and practice good nutrition. Contact a health care provider if:  You are getting worse rather than better.  Your symptoms are not controlled by medicine.  You have chills.  You have worsening shortness of breath.  You have brown or red mucus.  You have yellow or brown nasal discharge.  You have pain in your face, especially when you bend forward.  You have a fever.  You have swollen neck glands.  You have pain while swallowing.  You have white areas in the back of your throat. Get help right away if:  You have severe  or persistent: ? Headache. ? Ear pain. ? Sinus pain. ? Chest pain.  You have chronic lung disease and any of the following: ? Wheezing. ? Prolonged cough. ? Coughing up blood. ? A change in your usual mucus.  You have a stiff neck.  You have changes in your: ? Vision. ? Hearing. ? Thinking. ? Mood. This information is not intended to replace advice given to you by your health care provider. Make sure you discuss any questions you have with your health care provider. Document Released: 12/26/2000 Document Revised: 03/04/2016 Document Reviewed: 10/07/2013 Elsevier Interactive Patient Education  2017 [employer].

## 2017-04-10 NOTE — Progress Notes (Signed)
Subjective:  Patient ID: Rachel Crane, female    DOB: 08/24/1945  Age: 71 y.o. MRN: [id number]  CC: "don't feel good"   HPI  Rachel Crane is a 71 y.o. female with a PMH of OSA and HTN presents with body aches, low energy, fatigue. Does not endorse malaise, fever, chills, nausea, vomiting, recent trips. She feels more energetic during the morning but usually feels overly fatigued by the evening. Works 4 hours a day and therefore does not attribute fatigue to working. Feels alone and somewhat depressed since her move here from [place]. Lives alone with her pet dog. Has had trouble adjusting to her life here. Does not endorse any other symptoms or complaints.      Outpatient Medications Prior to Visit  Medication Sig Dispense Refill  . chlorthalidone (HYGROTON) 25 MG tablet Take 1 tablet (25 mg total) by mouth daily. 90 tablet 3  . hydrocortisone valerate ointment (WESTCORT) 0.2 % Apply 1 application topically 2 (two) times daily. 45 g 0  . hydrOXYzine (ATARAX/VISTARIL) 25 MG tablet Take 1 tablet (25 mg total) by mouth at bedtime. 30 tablet 0  . ibuprofen (ADVIL,MOTRIN) 400 MG tablet Take 1 tablet (400 mg total) by mouth every 8 (eight) hours as needed. 30 tablet 0  . losartan (COZAAR) 100 MG tablet Take 1 tablet (100 mg total) by mouth daily. 90 tablet 3  . lovastatin (MEVACOR) 20 MG tablet Take 1 tablet (20 mg total) by mouth at bedtime. 90 tablet 3  . Multiple Vitamins-Minerals (ONE-A-DAY WOMENS 50 PLUS PO) Take 1 tablet by mouth daily.     . Omega-3 Fatty Acids (FISH OIL) 1000 MG CAPS Take 1,000 mg by mouth daily.    . predniSONE (DELTASONE) 50 MG tablet Take 50 mg by mouth daily with breakfast.    . PROAIR HFA 108 (90 Base) MCG/ACT inhaler INL 2 PFS PO Q 6 H PRN  0   No facility-administered medications prior to visit.      ROS Review of Systems  Constitutional: Positive for malaise/fatigue. Negative for chills and fever.  Eyes: Negative for blurred vision.  Respiratory: Negative for  shortness of breath.   Cardiovascular: Negative for chest pain and palpitations.  Gastrointestinal: Negative for abdominal pain and nausea.  Genitourinary: Negative for dysuria and hematuria.  Musculoskeletal: Positive for myalgias. Negative for joint pain.  Skin: Negative for rash.  Neurological: Negative for tingling and headaches.  Psychiatric/Behavioral: Negative for depression. The patient is not nervous/anxious.     Objective:  There were no vitals taken for this visit.  BP/Weight 02/18/2017 0/27/7412 02/20/8675  Systolic BP 720 947 096  Diastolic BP 64 68 64  Wt. (Lbs) 204 201 -  BMI 33.95 33.45 -      Physical Exam  Constitutional: She is oriented to person, place, and time.  Well developed, well nourished, NAD, polite  HENT:  Head: Normocephalic and atraumatic.  Mildly erythematous tonsils. Mild postnasal drip. Turbinates erythematous and moderately hypertrophic bilaterally.  Eyes: No scleral icterus.  Neck: Normal range of motion. Neck supple. No thyromegaly present.  Cardiovascular: Normal rate, regular rhythm and normal heart sounds.   Pulmonary/Chest: Effort normal and breath sounds normal.  Musculoskeletal: She exhibits no edema.  Lymphadenopathy:    She has no cervical adenopathy.  Neurological: She is alert and oriented to person, place, and time. No cranial nerve deficit. Coordination normal.  Skin: Skin is warm and dry. No rash noted. No erythema. No pallor.  Psychiatric: She has a normal mood  and affect. Her behavior is normal. Thought content normal.  Vitals reviewed.    Assessment & Plan:    1. Acute upper respiratory infection - Begin DM-APAP-CPM (CORICIDIN HBP FLU) 15-500-2 MG TABS; Take 2 tablets by mouth every 6 (six) hours.  Dispense: 1 each; Refill: 0 - Begin fluticasone (FLONASE) 50 MCG/ACT nasal spray; Place 2 sprays into both nostrils daily.  Dispense: 16 g; Refill: 6  2. Fatigue, unspecified type - Urinalysis Dipstick normal in clinic  today - CBC with Differential - Comprehensive metabolic panel - CK (ordered due to body aches and use of statin)  3. Body aches - Urinalysis Dipstick normal in clinic today - CBC with Differential - Comprehensive metabolic panel - CK (ordered due to body aches and use of statin)  4. Adjustment disorder with mixed anxiety and depressed mood - Begin escitalopram (LEXAPRO) 10 MG tablet; Take 1 tablet (10 mg total) by mouth daily.  Dispense: 30 tablet; Refill: 2 - Begin clonazePAM (KLONOPIN) 0.25 MG disintegrating tablet; Take 1 tablet (0.25 mg total) by mouth at bedtime.  Dispense: 30 tablet; Refill: 0   Meds ordered this encounter  Medications  . escitalopram (LEXAPRO) 10 MG tablet    Sig: Take 1 tablet (10 mg total) by mouth daily.    Dispense:  30 tablet    Refill:  2    Order Specific Question:   Supervising Provider    Answer:   Tresa Garter [id number]  . clonazePAM (KLONOPIN) 0.25 MG disintegrating tablet    Sig: Take 1 tablet (0.25 mg total) by mouth at bedtime.    Dispense:  30 tablet    Refill:  0    Order Specific Question:   Supervising Provider    Answer:   Tresa Garter [id number]  . DM-APAP-CPM (CORICIDIN HBP FLU) 15-500-2 MG TABS    Sig: Take 2 tablets by mouth every 6 (six) hours.    Dispense:  1 each    Refill:  0    Order Specific Question:   Supervising Provider    Answer:   Tresa Garter [id number]  . fluticasone (FLONASE) 50 MCG/ACT nasal spray    Sig: Place 2 sprays into both nostrils daily.    Dispense:  16 g    Refill:  6    Order Specific Question:   Supervising Provider    Answer:   Tresa Garter [id number]    Follow-up: Return if symptoms worsen or fail to improve.   Clent Demark PA

## 2017-04-11 ENCOUNTER — Telehealth (INDEPENDENT_AMBULATORY_CARE_PROVIDER_SITE_OTHER): Payer: Self-pay

## 2017-04-11 ENCOUNTER — Other Ambulatory Visit (INDEPENDENT_AMBULATORY_CARE_PROVIDER_SITE_OTHER): Payer: Self-pay | Admitting: Physician Assistant

## 2017-04-11 DIAGNOSIS — E876 Hypokalemia: Secondary | ICD-10-CM

## 2017-04-11 LAB — CBC WITH DIFFERENTIAL/PLATELET
Basophils Absolute: 0 10*3/uL (ref 0.0–0.2)
Basos: 0 %
EOS (ABSOLUTE): 0.1 10*3/uL (ref 0.0–0.4)
Eos: 1 %
Hematocrit: 35.5 % (ref 34.0–46.6)
Hemoglobin: 11.1 g/dL (ref 11.1–15.9)
Immature Grans (Abs): 0 10*3/uL (ref 0.0–0.1)
Immature Granulocytes: 0 %
Lymphocytes Absolute: 3.1 10*3/uL (ref 0.7–3.1)
Lymphs: 41 %
MCH: 26.8 pg (ref 26.6–33.0)
MCHC: 31.3 g/dL — ABNORMAL LOW (ref 31.5–35.7)
MCV: 86 fL (ref 79–97)
Monocytes Absolute: 0.5 10*3/uL (ref 0.1–0.9)
Monocytes: 7 %
Neutrophils Absolute: 3.8 10*3/uL (ref 1.4–7.0)
Neutrophils: 51 %
Platelets: 214 10*3/uL (ref 150–379)
RBC: 4.14 x10E6/uL (ref 3.77–5.28)
RDW: 15 % (ref 12.3–15.4)
WBC: 7.5 10*3/uL (ref 3.4–10.8)

## 2017-04-11 LAB — COMPREHENSIVE METABOLIC PANEL
ALT: 20 IU/L (ref 0–32)
AST: 19 IU/L (ref 0–40)
Albumin/Globulin Ratio: 1.5 (ref 1.2–2.2)
Albumin: 4.3 g/dL (ref 3.6–4.8)
Alkaline Phosphatase: 74 IU/L (ref 39–117)
BUN/Creatinine Ratio: 20 (ref 12–28)
BUN: 20 mg/dL (ref 8–27)
Bilirubin Total: <0.2 mg/dL (ref 0.0–1.2)
CO2: 27 mmol/L (ref 20–29)
Calcium: 9.3 mg/dL (ref 8.7–10.3)
Chloride: 99 mmol/L (ref 96–106)
Creatinine, Ser: 1 mg/dL (ref 0.57–1.00)
GFR calc Af Amer: 66 mL/min/{1.73_m2} (ref >59)
GFR calc non Af Amer: 58 mL/min/{1.73_m2} — ABNORMAL LOW (ref >59)
Globulin, Total: 2.9 g/dL (ref 1.5–4.5)
Glucose: 93 mg/dL (ref 65–99)
Potassium: 3.1 mmol/L — ABNORMAL LOW (ref 3.5–5.2)
Sodium: 142 mmol/L (ref 134–144)
Total Protein: 7.2 g/dL (ref 6.0–8.5)

## 2017-04-11 LAB — CK: Total CK: 213 U/L — ABNORMAL HIGH (ref 24–173)

## 2017-04-11 MED ORDER — POTASSIUM CHLORIDE CRYS ER 20 MEQ PO TBCR
20.0000 meq | EXTENDED_RELEASE_TABLET | Freq: Every day | ORAL | 2 refills | Status: DC
Start: 2017-04-11 — End: 2017-05-21

## 2017-04-11 NOTE — Telephone Encounter (Signed)
Patient aware of lab results and to pick up Rx for potassium. Nat Christen, CMA

## 2017-04-11 NOTE — Telephone Encounter (Signed)
-----   Message from Clent Demark, PA-C sent at 04/11/2017  4:27 PM EDT ----- Potassium a little low. I will send out potassium pills to [facility].

## 2017-04-12 ENCOUNTER — Encounter: Payer: Self-pay | Admitting: Allergy

## 2017-04-12 ENCOUNTER — Ambulatory Visit (INDEPENDENT_AMBULATORY_CARE_PROVIDER_SITE_OTHER): Payer: Medicare Other | Admitting: Allergy

## 2017-04-12 VITALS — BP 114/68 | HR 74 | Resp 18 | Ht 66.0 in | Wt 206.4 lb

## 2017-04-12 DIAGNOSIS — H9313 Tinnitus, bilateral: Secondary | ICD-10-CM | POA: Diagnosis not present

## 2017-04-12 DIAGNOSIS — H1010 Acute atopic conjunctivitis, unspecified eye: Secondary | ICD-10-CM

## 2017-04-12 DIAGNOSIS — J309 Allergic rhinitis, unspecified: Secondary | ICD-10-CM | POA: Diagnosis not present

## 2017-04-12 MED ORDER — AZELASTINE HCL 0.1 % NA SOLN: 2.0000 | Freq: Two times a day (BID) | NASAL | 5 refills | Status: DC

## 2017-04-12 MED ORDER — FLUTICASONE PROPIONATE 50 MCG/ACT NA SUSP: 2.0000 | Freq: Two times a day (BID) | NASAL | 5 refills | Status: DC

## 2017-04-12 NOTE — Progress Notes (Signed)
New Patient Note  RE: Rachel Crane MRN: [id number] DOB: March 02, 1946 Date of Office Visit: 04/12/2017  Referring provider: Clent Demark, PA-C Primary care provider: Clent Demark, PA-C  Chief Complaint: Pruritus, allergic rhinitis  History of present illness: Rachel Crane is a 71 y.o. female presenting today for consultation for allergic rhinitis and pruritus. She is originally from [place] and lived in [place], [place] for 40 years. She came to [place] originally in August 2017 to close on her home in [place] and returned to [place] shortly afterwards. She then noticed significant migratory pruritus without rash. She saw her dermatologist in [place] who prescribed Triamcinolone cream with benefit. She officially moved to [facility] in November 2017 and noticed a return of her symptoms. Her pruritus is worse in hot environments and outdoors, less noticeable in cool areas or indoors. She saw another dermatologist here who advised her to start using CeraVe moisturizing cream which has been providing relief. She says she is no longer having any pruritus.  She has also had symptoms of clear rhinorrhea, watery/itchy eyes, postnasal drip, and sneezing when exposed to irritants such as bleach, perfumes/colognes, pollen, or other strong scents/chemicals. She denies any cough, sore throat, fevers, chills, or reflux. She does report a ringing sensation in her ears for which she is using an OTC otic drop without significant relief.  She had Zone 2 Allergen testing by her PCP on 01/07/17 which showed sensitivity to dust mites, trees, pollen, and mold. She was advised to start a daily antihistamine and started Zyrtec which she is not using often. She saw her PCP again on 04/10/17 and started on Flonase for viral URI, which she is using with benefit. She is also using her home remedy of wine vinegar gargles for throat irritation with relief.  She denies any known drug allergies. She eats fish, chicken, beef, shellfish, fruits,  vegetables, dairy, peanuts, almonds, pecans/walnuts, and wheat products without issue. She has a pet poodle for many years to which she has never had a reaction. She has not had symptoms around cats.   She has OSA and uses a CPAP at night. She denies any history of asthma or eczema. She has 3 children (2 girls, 1 boy). One of her daughters has asthma and eczema. The house she is living in was built about 1 year ago. She works at [employer].   Review of systems: Review of Systems  Constitutional: Negative for chills, fever and malaise/fatigue.  HENT: Positive for congestion and tinnitus. Negative for ear discharge, ear pain, hearing loss, nosebleeds, sinus pain and sore throat.   Eyes: Negative for pain, discharge and redness.  Respiratory: Negative for cough, shortness of breath and wheezing.   Cardiovascular: Negative for chest pain.  Gastrointestinal: Negative for abdominal pain, constipation, diarrhea, heartburn, nausea and vomiting.  Musculoskeletal: Negative for joint pain.  Skin: Negative for itching and rash.  Neurological: Negative for headaches.    All other systems negative unless noted above in HPI  Past medical history: Past Medical History:  Diagnosis Date  . Arthritis   . Hypertension     Past surgical history: Past Surgical History:  Procedure Laterality Date  . CHOLECYSTECTOMY      Family history:  Family History  Problem Relation Age of Onset  . Breast cancer Sister     Social history: Lives in a new construction home.  There are no concerns for water damage, mildew or roaches in the home.  She has no smoking history.  Medication List: Allergies as of 04/12/2017   No Known Allergies     Medication List       Accurate as of 04/12/17  4:13 PM. Always use your most recent med list.          chlorthalidone 25 MG tablet Commonly known as:  HYGROTON Take 1 tablet (25 mg total) by mouth daily.   clonazePAM 0.25 MG disintegrating tablet Commonly  known as:  KLONOPIN Take 1 tablet (0.25 mg total) by mouth at bedtime.   escitalopram 10 MG tablet Commonly known as:  LEXAPRO Take 1 tablet (10 mg total) by mouth daily.   fluticasone 50 MCG/ACT nasal spray Commonly known as:  FLONASE Place 2 sprays into both nostrils daily.   losartan 100 MG tablet Commonly known as:  COZAAR Take 1 tablet (100 mg total) by mouth daily.   lovastatin 20 MG tablet Commonly known as:  MEVACOR Take 1 tablet (20 mg total) by mouth at bedtime.   ONE-A-DAY WOMENS 50 PLUS PO Take 1 tablet by mouth daily.   potassium chloride SA 20 MEQ tablet Commonly known as:  K-DUR,KLOR-CON Take 1 tablet (20 mEq total) by mouth daily.   PROAIR HFA 108 (90 Base) MCG/ACT inhaler Generic drug:  albuterol INL 2 PFS PO Q 6 H PRN   triamcinolone cream 0.1 % Commonly known as:  KENALOG APP AA BID       Known medication allergies: No Known Allergies   Physical examination: Blood pressure 114/68, pulse 74, resp. rate 18, height 5\' 6"  (1.676 m), weight 206 lb 6.4 oz (93.6 kg), SpO2 97 %.  General: Alert, interactive, in no acute distress. HEENT: PERRLA, TMs pearly gray, cerumen bilateral ear canals, turbinates mildly edematous without discharge, post-pharynx non erythematous. Neck: Supple without lymphadenopathy. Lungs: Clear to auscultation without wheezing, rhonchi or rales. {no increased work of breathing. CV: Normal S1, S2 without murmurs. Abdomen: Nondistended, nontender. Skin: Warm and dry, without lesions or rashes. Extremities:  No clubbing, cyanosis or edema. Neuro:   Grossly intact.  Diagnositics/Labs: None obtained this visit.    Assessment and plan:   Allergic Rhinoconjunctivitis - Continue Flonase 2 sprays each nostril once daily for sinus congestion - Start Azelastine 0.1% 2 sprays each nostril twice a day for runny nose/post-nasal drip. Will send prescription. - Given sample of Dymista (contains flonase and Azelastine). Do not need to  use Flonase or Azelastine when using Dymista. - Can use both Flonase and Azelastine together if needed - Start daily antihistamine Xyzal 5 mg daily. Samples given. - Can consider adding Ipratropium nasal spray as needed for non-allergic rhinitis (exposure to bleach, etc) - Information for allergen avoidance measures provided  Ringing in the Ears (Tinnitus): - Will send referral to ENT   Follow up in 6 months or sooner if needed.  Zada Finders, MD Internal Medicine PGY-3  I appreciate the opportunity to take part in Rachel Crane's care. Please do not hesitate to contact me with questions.  Sincerely,   Prudy Feeler, MD Allergy/Immunology [facility]

## 2017-04-12 NOTE — Patient Instructions (Addendum)
Allergic Rhinitis: - Continue Flonase 2 sprays each nostril once daily for sinus congestion - Start Azelastine 0.1% 2 sprays each nostril twice a day for runny nose/post-nasal drip. Will send prescription. - Given sample of Dymista (contains flonase and Azelastine). Do not need to use Flonase or Azelastine when using Dymista. - Can use both Flonase and Azelastine together if needed - Start daily antihistamine Xyzal 5 mg daily. Samples given. - Can consider adding Ipratropium nasal spray as needed for non-allergic rhinitis (exposure to bleach, etc) - Information for exposure prophylaxis given  Ringing in the Ears (Tinnitus): - Will send referral to ENT

## 2017-04-15 ENCOUNTER — Telehealth: Payer: Self-pay | Admitting: Allergy

## 2017-04-15 NOTE — Telephone Encounter (Signed)
-----   Message from [place], MD sent at 04/12/2017  4:27 PM EDT ----- Regarding: ENT ([facility] referral) Ambulatory referral for ENT in EMR.  Pt wants to go next door to ENT for eval of tinnitus.

## 2017-04-15 NOTE — Telephone Encounter (Signed)
Called Dr Ernesto Rutherford office Faxed over information They will contact the patient directly to set up appt and hearing test.

## 2017-04-16 DIAGNOSIS — R42 Dizziness and giddiness: Secondary | ICD-10-CM | POA: Diagnosis not present

## 2017-04-16 DIAGNOSIS — H903 Sensorineural hearing loss, bilateral: Secondary | ICD-10-CM | POA: Diagnosis not present

## 2017-04-16 DIAGNOSIS — H9313 Tinnitus, bilateral: Secondary | ICD-10-CM | POA: Diagnosis not present

## 2017-04-24 DIAGNOSIS — H25813 Combined forms of age-related cataract, bilateral: Secondary | ICD-10-CM | POA: Diagnosis not present

## 2017-04-24 DIAGNOSIS — H40013 Open angle with borderline findings, low risk, bilateral: Secondary | ICD-10-CM | POA: Diagnosis not present

## 2017-04-24 DIAGNOSIS — H43393 Other vitreous opacities, bilateral: Secondary | ICD-10-CM | POA: Diagnosis not present

## 2017-05-13 ENCOUNTER — Emergency Department (HOSPITAL_COMMUNITY): Payer: Medicare Other

## 2017-05-13 ENCOUNTER — Encounter (HOSPITAL_COMMUNITY): Payer: Self-pay | Admitting: *Deleted

## 2017-05-13 ENCOUNTER — Other Ambulatory Visit: Payer: Self-pay

## 2017-05-13 ENCOUNTER — Emergency Department (HOSPITAL_COMMUNITY)
Admission: EM | Admit: 2017-05-13 | Discharge: 2017-05-13 | Disposition: A | Discharge status: Home / Self Care | Payer: Medicare Other | Attending: Emergency Medicine | Admitting: Emergency Medicine

## 2017-05-13 DIAGNOSIS — M25512 Pain in left shoulder: Secondary | ICD-10-CM | POA: Diagnosis not present

## 2017-05-13 DIAGNOSIS — M25511 Pain in right shoulder: Secondary | ICD-10-CM

## 2017-05-13 DIAGNOSIS — R079 Chest pain, unspecified: Secondary | ICD-10-CM | POA: Diagnosis not present

## 2017-05-13 DIAGNOSIS — I10 Essential (primary) hypertension: Secondary | ICD-10-CM | POA: Insufficient documentation

## 2017-05-13 DIAGNOSIS — R0789 Other chest pain: Secondary | ICD-10-CM | POA: Insufficient documentation

## 2017-05-13 DIAGNOSIS — Z79899 Other long term (current) drug therapy: Secondary | ICD-10-CM | POA: Diagnosis not present

## 2017-05-13 LAB — CBC
HCT: 35.4 % — ABNORMAL LOW (ref 36.0–46.0)
Hemoglobin: 11.5 g/dL — ABNORMAL LOW (ref 12.0–15.0)
MCH: 27 pg (ref 26.0–34.0)
MCHC: 32.5 g/dL (ref 30.0–36.0)
MCV: 83.1 fL (ref 78.0–100.0)
Platelets: 254 10*3/uL (ref 150–400)
RBC: 4.26 MIL/uL (ref 3.87–5.11)
RDW: 14.5 % (ref 11.5–15.5)
WBC: 8.6 10*3/uL (ref 4.0–10.5)

## 2017-05-13 LAB — BASIC METABOLIC PANEL
Anion gap: 10 (ref 5–15)
BUN: 14 mg/dL (ref 6–20)
CO2: 26 mmol/L (ref 22–32)
Calcium: 9.7 mg/dL (ref 8.9–10.3)
Chloride: 102 mmol/L (ref 101–111)
Creatinine, Ser: 0.93 mg/dL (ref 0.44–1.00)
GFR calc Af Amer: >60 mL/min (ref >60)
GFR calc non Af Amer: >60 mL/min (ref >60)
Glucose, Bld: 106 mg/dL — ABNORMAL HIGH (ref 65–99)
Potassium: 3.1 mmol/L — ABNORMAL LOW (ref 3.5–5.1)
Sodium: 138 mmol/L (ref 135–145)

## 2017-05-13 LAB — I-STAT TROPONIN, ED
Troponin i, poc: 0 ng/mL (ref 0.00–0.08)
Troponin i, poc: 0.01 ng/mL (ref 0.00–0.08)

## 2017-05-13 MED ORDER — ALUM & MAG HYDROXIDE-SIMETH 200-200-20 MG/5ML PO SUSP
30.0000 mL | Freq: Once | ORAL | Status: AC
  Administered 2017-05-13: 30 mL via ORAL
  Filled 2017-05-13: qty 30

## 2017-05-13 MED ORDER — LIDOCAINE VISCOUS 2 % MT SOLN
15.0000 mL | Freq: Once | OROMUCOSAL | Status: AC
  Administered 2017-05-13: 15 mL via OROMUCOSAL
  Filled 2017-05-13: qty 15

## 2017-05-13 NOTE — ED Provider Notes (Signed)
[facility] EMERGENCY DEPARTMENT Provider Note CSN: [id number] Arrival date & time: 05/13/17  1114 History   Chief Complaint Chief Complaint  Patient presents with  . Chest Pain   HPI Rachel Crane is a 71 y.o. female with PMH of HTN who presented with complaint of chest and left shoulder pain that started approximately 4 days ago. The patient states that prior to that she had been working in her yard with a hoe and thinks she may have overexerted herself. The pain is described as burning and is make worse with movement. Relieved by staying still.   The history is provided by the patient.  Chest Pain   This is a new problem. The current episode started more than 2 days ago. The problem occurs constantly. The problem has not changed since onset.The pain is present in the substernal region. The pain is mild. The quality of the pain is described as burning. The pain radiates to the left shoulder. Pertinent negatives include no abdominal pain, no back pain, no cough, no fever, no palpitations, no shortness of breath and no vomiting. She has tried nothing for the symptoms. The treatment provided no relief. Risk factors include being elderly and obesity.  Her past medical history is significant for hypertension.  Pertinent negatives for past medical history include no seizures.   Past Medical History:  Diagnosis Date  . Arthritis   . Hypertension    Patient Active Problem List   Diagnosis Date Noted  . Sprain of anterior talofibular ligament of right ankle 12/13/2016   Past Surgical History:  Procedure Laterality Date  . CHOLECYSTECTOMY     OB History    No data available     Home Medications    Prior to Admission medications   Medication Sig Start Date End Date Taking? Authorizing Provider  azelastine (ASTELIN) 0.1 % nasal spray Place 2 sprays into both nostrils 2 (two) times daily. Patient taking differently: Place 2 sprays into both nostrils 2 (two) times daily  as needed for rhinitis or allergies.  04/12/17  Yes Padgett, Rae Halsted, MD  chlorthalidone (HYGROTON) 25 MG tablet Take 1 tablet (25 mg total) by mouth daily. 03/25/17  Yes Clent Demark, PA-C  fluticasone [facility]) 50 MCG/ACT nasal spray Place 2 sprays into both nostrils daily. Patient taking differently: Place 2 sprays into both nostrils daily as needed for allergies or rhinitis.  04/10/17  Yes Clent Demark, PA-C  losartan (COZAAR) 100 MG tablet Take 1 tablet (100 mg total) by mouth daily. 02/18/17  Yes Clent Demark, PA-C  lovastatin (MEVACOR) 20 MG tablet Take 1 tablet (20 mg total) by mouth at bedtime. 02/25/17  Yes Clent Demark, PA-C  Multiple Vitamins-Minerals (ONE-A-DAY WOMENS 50 PLUS PO) Take 1 tablet by mouth daily.    Yes [provider]  potassium chloride SA (K-DUR,KLOR-CON) 20 MEQ tablet Take 1 tablet (20 mEq total) by mouth daily. 04/11/17  Yes Clent Demark, PA-C  PROAIR HFA 108 [phone] Base) MCG/ACT inhaler INL 2 PFS PO Q 6 H PRN 11/20/16  Yes [provider]  triamcinolone cream (KENALOG) 0.1 % APP AA BID 02/18/17  Yes [provider]  clonazePAM (KLONOPIN) 0.25 MG disintegrating tablet Take 1 tablet (0.25 mg total) by mouth at bedtime. Patient not taking: Reported on 05/13/2017 04/10/17   Clent Demark, PA-C  escitalopram (LEXAPRO) 10 MG tablet Take 1 tablet (10 mg total) by mouth daily. Patient not taking: Reported on 05/13/2017 04/10/17  Clent Demark, PA-C  fluticasone [facility]) 50 MCG/ACT nasal spray Place 2 sprays into both nostrils 2 (two) times daily. Patient not taking: Reported on 05/13/2017 04/12/17   Kennith Gain, MD   Family History Family History  Problem Relation Age of Onset  . Breast cancer Sister    Social History Social History  Substance Use Topics  . Smoking status: Never Smoker  . Smokeless tobacco: Never Used  . Alcohol use No   Allergies   Patient has no known  allergies.   Review of Systems Review of Systems  Constitutional: Negative for chills and fever.  HENT: Negative for ear pain and sore throat.   Eyes: Negative for pain and visual disturbance.  Respiratory: Negative for cough and shortness of breath.   Cardiovascular: Positive for chest pain. Negative for palpitations.  Gastrointestinal: Negative for abdominal pain and vomiting.  Genitourinary: Negative for dysuria and hematuria.  Musculoskeletal: Positive for arthralgias. Negative for back pain.  Skin: Negative for color change and rash.  Neurological: Negative for seizures and syncope.  All other systems reviewed and are negative.  Physical Exam Updated Vital Signs BP (!) 128/59   Pulse 72   Temp 98.9 F (37.2 C) (Oral)   Resp 18   Ht 5\' 6"  (1.676 m)   Wt 92.5 kg (204 lb)   SpO2 96%   BMI 32.93 kg/m   Physical Exam  Constitutional: She is oriented to person, place, and time. She appears well-developed and well-nourished. No distress.  HENT:  Head: Normocephalic and atraumatic.  Eyes: Pupils are equal, round, and reactive to light. Conjunctivae and EOM are normal.  Neck: Normal range of motion. Neck supple.  Cardiovascular: Normal rate and regular rhythm.   No murmur heard. Pulmonary/Chest: Effort normal and breath sounds normal. No respiratory distress.  Abdominal: Soft. Bowel sounds are normal. She exhibits no distension. There is no tenderness.  Musculoskeletal: She exhibits tenderness (over the left shoulder and painful ROM). She exhibits no edema or deformity.  Neurological: She is alert and oriented to person, place, and time.  Skin: Skin is warm and dry.  Psychiatric: She has a normal mood and affect.  Nursing note and vitals reviewed.  ED Treatments / Results  Labs (all labs ordered are listed, but only abnormal results are displayed) Labs Reviewed  BASIC METABOLIC PANEL - Abnormal; Notable for the following:       Result Value   Potassium 3.1 (*)     Glucose, Bld 106 (*)    All other components within normal limits  CBC - Abnormal; Notable for the following:    Hemoglobin 11.5 (*)    HCT 35.4 (*)    All other components within normal limits  I-STAT TROPONIN, ED  I-STAT TROPONIN, ED   EKG  EKG Interpretation  Date/Time:  Monday May 13 2017 11:27:34 EDT Ventricular Rate:  84 PR Interval:  164 QRS Duration: 80 QT Interval:  356 QTC Calculation: 420 R Axis:   63 Text Interpretation:  Normal sinus rhythm Non-specific ST-t changes Confirmed by Virgel Manifold [phone]) on 05/13/2017 6:59:28 PM       Radiology Dg Chest 2 View  Result Date: 05/13/2017 CLINICAL DATA:  Sternal chest pain radiating into the left arm. EXAM: CHEST  2 VIEW COMPARISON:  None. FINDINGS: The cardiomediastinal silhouette is normal in size. Normal pulmonary vascularity. No focal consolidation, pleural effusion, or pneumothorax. No acute osseous abnormality. Surgical clips in the right upper quadrant. IMPRESSION: No active cardiopulmonary disease. Electronically Signed  By: Titus Dubin M.D.   On: 05/13/2017 12:49   Procedures Procedures (including critical care time)  Medications Ordered in ED Medications  alum & mag hydroxide-simeth (MAALOX/MYLANTA) 200-200-20 MG/5ML suspension 30 mL (30 mLs Oral Given 05/13/17 1838)  lidocaine (XYLOCAINE) 2 % viscous mouth solution 15 mL (15 mLs Mouth/Throat Given 05/13/17 1838)   Initial Impression / Assessment and Plan / ED Course  I have reviewed the triage vital signs and the nursing notes.  Pertinent labs & imaging results that were available during my care of the patient were reviewed by me and considered in my medical decision making (see chart for details).  Upon my evaluation patient patient endorses atypical chest pain today. Symptoms describe as a burning in her substernal and epigastric region with painful ROM of the of the left shoulder.   EKG I obtained reveals no anatomical ischemia representing  STEMI, New-onset Arrhythmia, or ischemic equivalent. Therefore do not suspect ACS at this time. No concerns for Pericardial Tamponade on EKG and in light of patients hemodynamic stability. No pain related to supine or prone positions and given EKG doubt Pericarditis.   Per the patient's absence of CAD will order serialTroponin. Troponin negative x2, Therefore also do not suspect Myocarditis.   CXR unremarkable for focal airspace disease, patient is afebrile,  cough, and WBC shows no leukocytosis, do not suspect Pneumonia. Without evidence of Pneumothorax. CXR without concern for Esophageal Tear and there is no recent intractable emesis or esophageal instrumentation.  No peritonitis or free air on CXR worrisome for Perforated Abdominal Viscous.  Patient given GI cocktail for burning sensation without significant change in symptoms  Pain is not described as tearing and does not radiate to back, doubt Aortic Dissection. Pulses present bilaterally in upper and lower extremities. CXR does not show widened mediastinum.  Patient pain appears to be most consistent with musculoskeletal at this time as it is reproducible with both passive and active ROM and is tender to palpation.    My H&P and patient's clinical course is not currently consistent with any of the above emergency medical conditions at this time. At this time discussed discharged with the patient who feels safe and comfortable going home. Patient given strict return precautions advised to return to the ED for any new or worsening symptoms. Advised to follow up with her PCP in 3-5 days for re-evaluation.   Final Clinical Impressions(s) / ED Diagnoses   Final diagnoses:  Shoulder pain, right  Chest pain, unspecified type   New Prescriptions New Prescriptions   No medications on file     Fenton Foy, MD 05/14/17 1433    Virgel Manifold, MD 05/28/17 1122

## 2017-05-13 NOTE — ED Triage Notes (Signed)
Pt states sternal chest pain that radiates to L arm since last night.  States some sob since yesterday.

## 2017-05-13 NOTE — ED Notes (Signed)
Pt. returned from XR. 

## 2017-05-21 ENCOUNTER — Encounter (INDEPENDENT_AMBULATORY_CARE_PROVIDER_SITE_OTHER): Payer: Self-pay | Admitting: Physician Assistant

## 2017-05-21 ENCOUNTER — Ambulatory Visit (INDEPENDENT_AMBULATORY_CARE_PROVIDER_SITE_OTHER): Payer: Medicare Other | Admitting: Physician Assistant

## 2017-05-21 VITALS — BP 101/64 | HR 67 | Temp 98.0°F | Wt 207.6 lb

## 2017-05-21 DIAGNOSIS — G8929 Other chronic pain: Secondary | ICD-10-CM | POA: Diagnosis not present

## 2017-05-21 DIAGNOSIS — M25512 Pain in left shoulder: Secondary | ICD-10-CM | POA: Diagnosis not present

## 2017-05-21 DIAGNOSIS — H61892 Other specified disorders of left external ear: Secondary | ICD-10-CM

## 2017-05-21 DIAGNOSIS — I10 Essential (primary) hypertension: Secondary | ICD-10-CM

## 2017-05-21 DIAGNOSIS — F411 Generalized anxiety disorder: Secondary | ICD-10-CM

## 2017-05-21 DIAGNOSIS — E876 Hypokalemia: Secondary | ICD-10-CM | POA: Diagnosis not present

## 2017-05-21 DIAGNOSIS — Z9119 Patient's noncompliance with other medical treatment and regimen: Secondary | ICD-10-CM | POA: Diagnosis not present

## 2017-05-21 DIAGNOSIS — R079 Chest pain, unspecified: Secondary | ICD-10-CM

## 2017-05-21 DIAGNOSIS — D649 Anemia, unspecified: Secondary | ICD-10-CM

## 2017-05-21 DIAGNOSIS — Z91199 Patient's noncompliance with other medical treatment and regimen due to unspecified reason: Secondary | ICD-10-CM

## 2017-05-21 MED ORDER — POTASSIUM CHLORIDE CRYS ER 20 MEQ PO TBCR: 20.0000 meq | EXTENDED_RELEASE_TABLET | Freq: Every day | ORAL | 3 refills | Status: DC

## 2017-05-21 MED ORDER — NEOMYCIN-POLYMYXIN-HC 3.5-10000-1 OT SOLN: 4.0000 [drp] | Freq: Four times a day (QID) | OTIC | 0 refills | Status: DC

## 2017-05-21 MED ORDER — NAPROXEN 500 MG PO TABS: 500.0000 mg | ORAL_TABLET | Freq: Two times a day (BID) | ORAL | 0 refills | Status: DC

## 2017-05-21 MED ORDER — CHLORTHALIDONE 25 MG PO TABS: 12.5000 mg | ORAL_TABLET | Freq: Every day | ORAL | 1 refills | Status: DC

## 2017-05-21 MED ORDER — FERROUS SULFATE 325 (65 FE) MG PO TABS: 325.0000 mg | ORAL_TABLET | Freq: Every day | ORAL | 0 refills | Status: DC

## 2017-05-21 NOTE — Patient Instructions (Addendum)
Shoulder Pain Many things can cause shoulder pain, including:  An injury to the area.  Overuse of the shoulder.  Arthritis.  The source of the pain can be:  Inflammation.  An injury to the shoulder joint.  An injury to a tendon, ligament, or bone.  Follow these instructions at home: Take these actions to help with your pain:  Squeeze a soft ball or a foam pad as much as possible. This helps to keep the shoulder from swelling. It also helps to strengthen the arm.  Take over-the-counter and prescription medicines only as told by your health care provider.  If directed, apply ice to the area: ? Put ice in a plastic bag. ? Place a towel between your skin and the bag. ? Leave the ice on for 20 minutes, 2-3 times per day. Stop applying ice if it does not help with the pain.  If you were given a shoulder sling or immobilizer: ? Wear it as told. ? Remove it to shower or bathe. ? Move your arm as little as possible, but keep your hand moving to prevent swelling.  Contact a health care provider if:  Your pain gets worse.  Your pain is not relieved with medicines.  New pain develops in your arm, hand, or fingers. Get help right away if:  Your arm, hand, or fingers: ? Tingle. ? Become numb. ? Become swollen. ? Become painful. ? Turn white or blue. This information is not intended to replace advice given to you by your health care provider. Make sure you discuss any questions you have with your health care provider. Document Released: 04/11/2005 Document Revised: 02/26/2016 Document Reviewed: 10/25/2014 Elsevier Interactive Patient Education  2017 [place] After being diagnosed with an anxiety disorder, you may be relieved to know why you have felt or behaved a certain way. It is natural to also feel overwhelmed about the treatment ahead and what it will mean for your life. With care and support, you can manage this condition and recover from  it. How to cope with anxiety Dealing with stress Stress is your body's reaction to life changes and events, both good and bad. Stress can last just a few hours or it can be ongoing. Stress can play a major role in anxiety, so it is important to learn both how to cope with stress and how to think about it differently. Talk with your health care provider or a counselor to learn more about stress reduction. He or she may suggest some stress reduction techniques, such as:  Music therapy. This can include creating or listening to music that you enjoy and that inspires you.  Mindfulness-based meditation. This involves being aware of your normal breaths, rather than trying to control your breathing. It can be done while sitting or walking.  Centering prayer. This is a kind of meditation that involves focusing on a word, phrase, or sacred image that is meaningful to you and that brings you peace.  Deep breathing. To do this, expand your stomach and inhale slowly through your nose. Hold your breath for 3-5 seconds. Then exhale slowly, allowing your stomach muscles to relax.  Self-talk. This is a skill where you identify thought patterns that lead to anxiety reactions and correct those thoughts.  Muscle relaxation. This involves tensing muscles then relaxing them.  Choose a stress reduction technique that fits your lifestyle and personality. Stress reduction techniques take time and practice. Set aside 5-15 minutes a day to do them.  Therapists can offer training in these techniques. The training may be covered by some insurance plans. Other things you can do to manage stress include:  Keeping a stress diary. This can help you learn what triggers your stress and ways to control your response.  Thinking about how you respond to certain situations. You may not be able to control everything, but you can control your reaction.  Making time for activities that help you relax, and not feeling guilty about  spending your time in this way.  Therapy combined with coping and stress-reduction skills provides the best chance for successful treatment. Medicines Medicines can help ease symptoms. Medicines for anxiety include:  Anti-anxiety drugs.  Antidepressants.  Beta-blockers.  Medicines may be used as the main treatment for anxiety disorder, along with therapy, or if other treatments are not working. Medicines should be prescribed by a health care provider. Relationships Relationships can play a big part in helping you recover. Try to spend more time connecting with trusted friends and family members. Consider going to couples counseling, taking family education classes, or going to family therapy. Therapy can help you and others better understand the condition. How to recognize changes in your condition Everyone has a different response to treatment for anxiety. Recovery from anxiety happens when symptoms decrease and stop interfering with your daily activities at home or work. This may mean that you will start to:  Have better concentration and focus.  Sleep better.  Be less irritable.  Have more energy.  Have improved memory.  It is important to recognize when your condition is getting worse. Contact your health care provider if your symptoms interfere with home or work and you do not feel like your condition is improving. Where to find help and support: You can get help and support from these sources:  Self-help groups.  Online and [employer].  A trusted spiritual leader.  Couples counseling.  Family education classes.  Family therapy.  Follow these instructions at home:  Eat a healthy diet that includes plenty of vegetables, fruits, whole grains, low-fat dairy products, and lean protein. Do not eat a lot of foods that are high in solid fats, added sugars, or salt.  Exercise. Most adults should do the following: ? Exercise for at least 150 minutes each  week. The exercise should increase your heart rate and make you sweat (moderate-intensity exercise). ? Strengthening exercises at least twice a week.  Cut down on caffeine, tobacco, alcohol, and other potentially harmful substances.  Get the right amount and quality of sleep. Most adults need 7-9 hours of sleep each night.  Make choices that simplify your life.  Take over-the-counter and prescription medicines only as told by your health care provider.  Avoid caffeine, alcohol, and certain over-the-counter cold medicines. These may make you feel worse. Ask your pharmacist which medicines to avoid.  Keep all follow-up visits as told by your health care provider. This is important. Questions to ask your health care provider  Would I benefit from therapy?  How often should I follow up with a health care provider?  How long do I need to take medicine?  Are there any long-term side effects of my medicine?  Are there any alternatives to taking medicine? Contact a health care provider if:  You have a hard time staying focused or finishing daily tasks.  You spend many hours a day feeling worried about everyday life.  You become exhausted by worry.  You start to have headaches, feel tense,  or have nausea.  You urinate more than normal.  You have diarrhea. Get help right away if:  You have a racing heart and shortness of breath.  You have thoughts of hurting yourself or others. If you ever feel like you may hurt yourself or others, or have thoughts about taking your own life, get help right away. You can go to your nearest emergency department or call:  Your local emergency services (911 in the [place]  A suicide crisis helpline, such as the [facility] at 518-781-7039. This is open 24-hours a day.  Summary  Taking steps to deal with stress can help calm you.  Medicines cannot cure anxiety disorders, but they can help ease symptoms.  Family,  friends, and partners can play a big part in helping you recover from an anxiety disorder. This information is not intended to replace advice given to you by your health care provider. Make sure you discuss any questions you have with your health care provider. Document Released: 06/26/2016 Document Revised: 06/26/2016 Document Reviewed: 06/26/2016 Elsevier Interactive Patient Education  [employer].

## 2017-05-21 NOTE — Progress Notes (Signed)
Subjective:  Patient ID: Rachel Crane, female    DOB: [birth year]  Age: 71 y.o. MRN: [id number]  CC: chest pain and shoulder pain  HPI Rachel Crane a 71 y.o.femalewith a PMH of OSA and HTN presents with two week history of substernal chest pain and left shoulder pain. Went to the ED on 05/13/17 and had a cardiac workup which was deemed negative for cardiac etiology but was found to have a new onset arrhythmia. Labs revealed hypokalemia and anemia. Negative troponin. Denies current CP but left shoulder pain continues. Pain of shoulder exacerbated when lifting items and abducting/flexing past 90 degrees. Left shoulder xray revealed degenerative changes and irregularity of the inferior aspect of glenoid. No palpitations, SOB, HA, diaphoresis, weakness, swelling, trauma, fall, abdominal pain, f/c/n/v, rash, or GI/GU sxs.    Requests left ear to be examined as she is hearing a crinkling sound in the left ear. No pain or supporation reported    Outpatient Medications Prior to Visit  Medication Sig Dispense Refill  . fluticasone (FLONASE) 50 MCG/ACT nasal spray Place 2 sprays into both nostrils 2 (two) times daily. 16 g 5  . losartan (COZAAR) 100 MG tablet Take 1 tablet (100 mg total) by mouth daily. 90 tablet 3  . lovastatin (MEVACOR) 20 MG tablet Take 1 tablet (20 mg total) by mouth at bedtime. 90 tablet 3  . potassium chloride SA (K-DUR,KLOR-CON) 20 MEQ tablet Take 1 tablet (20 mEq total) by mouth daily. 30 tablet 2  . PROAIR HFA 108 (90 Base) MCG/ACT inhaler INL 2 PFS PO Q 6 H PRN  0  . triamcinolone cream (KENALOG) 0.1 % APP AA BID  0  . azelastine (ASTELIN) 0.1 % nasal spray Place 2 sprays into both nostrils 2 (two) times daily. (Patient taking differently: Place 2 sprays into both nostrils 2 (two) times daily as needed for rhinitis or allergies. ) 30 mL 5  . chlorthalidone (HYGROTON) 25 MG tablet Take 1 tablet (25 mg total) by mouth daily. 90 tablet 3  . clonazePAM (KLONOPIN) 0.25 MG  disintegrating tablet Take 1 tablet (0.25 mg total) by mouth at bedtime. (Patient not taking: Reported on 05/13/2017) 30 tablet 0  . escitalopram (LEXAPRO) 10 MG tablet Take 1 tablet (10 mg total) by mouth daily. (Patient not taking: Reported on 05/13/2017) 30 tablet 2  . fluticasone (FLONASE) 50 MCG/ACT nasal spray Place 2 sprays into both nostrils daily. (Patient not taking: Reported on 05/21/2017) 16 g 6  . Multiple Vitamins-Minerals (ONE-A-DAY WOMENS 50 PLUS PO) Take 1 tablet by mouth daily.      No facility-administered medications prior to visit.      ROS Review of Systems  Constitutional: Negative for chills, fever and malaise/fatigue.  Eyes: Negative for blurred vision.  Respiratory: Negative for shortness of breath.   Cardiovascular: Negative for chest pain and palpitations.  Gastrointestinal: Negative for abdominal pain and nausea.  Genitourinary: Negative for dysuria and hematuria.  Musculoskeletal: Negative for joint pain and myalgias.  Skin: Negative for rash.  Neurological: Negative for tingling and headaches.  Psychiatric/Behavioral: Negative for depression. The patient is not nervous/anxious.     Objective:  BP 101/64 (BP Location: Left Arm, Patient Position: Sitting, Cuff Size: Large)   Pulse 67   Temp 98 F (36.7 C) (Oral)   Wt 207 lb 9.6 oz (94.2 kg)   SpO2 95%   BMI 33.51 kg/m   BP/Weight 05/21/2017 05/13/2017 10/22/1446  Systolic BP 185 631 497  Diastolic BP 64 47 68  Wt. (Lbs) 207.6 204 206.4  BMI 33.51 32.93 33.31      Physical Exam  Constitutional: She is oriented to person, place, and time.  Well developed, overweight, NAD, polite  HENT:  Head: Normocephalic and atraumatic.  Left Ear: External ear normal.  Mild cerumen and erythema of the left ear canal. Visualized portion of the left TM appears pearly grey, no perforation noted.  Eyes: No scleral icterus.  Neck: Normal range of motion.  Cardiovascular: Normal rate, regular rhythm and normal  heart sounds.  Pulmonary/Chest: Effort normal and breath sounds normal.  Musculoskeletal: She exhibits no edema.  Pain elicited with abduction and flexion of the left shoulder at approximately 90 degrees  Neurological: She is alert and oriented to person, place, and time. No cranial nerve deficit. Coordination normal.  Skin: Skin is warm and dry. No rash noted. No erythema. No pallor.  Psychiatric: She has a normal mood and affect. Her behavior is normal. Thought content normal.  Vitals reviewed.    Assessment & Plan:    1. Chest pain, unspecified type - EKG 12-Lead with t wave inversion in V1 and V2 in the setting of hypokalemia. Unable to compare previous ECG from one week ago as I do not have access to [place]. Pt asymptomatic, will watch for now. Pt advised to call or go to ED if there is any recurrence of chest pain.  2. Irritation of left external auditory canal - Begin cortisporin  3. Hypokalemia - reduce chlorthalidone to half tab 25 mg qday - continue K-Dur 20 MEQ  4. Anemia, unspecified type - Begin ferrous sulfate  5. Chronic left shoulder pain - Ambulatory referral to Orthopedics  6. Generalized anxiety disorder - Not taking escitalopram or clonazepam   7. Noncompliance  - Not taking escitalopram or clonazepam   8. Hypertension, unspecified type - Decrease chlorthalidone (HYGROTON) 25 MG tablet; Take 0.5 tablets (12.5 mg total) daily by mouth.  Dispense: 45 tablet; Refill: 1   Meds ordered this encounter  Medications  . naproxen (NAPROSYN) 500 MG tablet    Sig: Take 1 tablet (500 mg total) 2 (two) times daily with a meal by mouth.    Dispense:  30 tablet    Refill:  0    Order Specific Question:   Supervising Provider    Answer:   Tresa Garter [id number]  . potassium chloride SA (K-DUR,KLOR-CON) 20 MEQ tablet    Sig: Take 1 tablet (20 mEq total) daily by mouth.    Dispense:  30 tablet    Refill:  3    Order Specific Question:   Supervising  Provider    Answer:   Tresa Garter [id number]  . neomycin-polymyxin-hydrocortisone (CORTISPORIN) OTIC solution    Sig: Place 4 drops 4 (four) times daily into the left ear.    Dispense:  10 mL    Refill:  0    Order Specific Question:   Supervising Provider    Answer:   Tresa Garter [id number]  . chlorthalidone (HYGROTON) 25 MG tablet    Sig: Take 0.5 tablets (12.5 mg total) daily by mouth.    Dispense:  45 tablet    Refill:  1    Order Specific Question:   Supervising Provider    Answer:   Tresa Garter [id number]  . ferrous sulfate 325 (65 FE) MG tablet    Sig: Take 1 tablet (325 mg total) daily with breakfast by mouth.    Dispense:  30 tablet  Refill:  0    Order Specific Question:   Supervising Provider    Answer:   Tresa Garter [[id number]]    Follow-up: Return in about 4 weeks (around 06/18/2017).   Clent Demark PA

## 2017-06-03 ENCOUNTER — Ambulatory Visit (INDEPENDENT_AMBULATORY_CARE_PROVIDER_SITE_OTHER): Payer: Medicare Other | Admitting: Orthopedic Surgery

## 2017-06-12 ENCOUNTER — Encounter: Payer: Self-pay | Admitting: Adult Health

## 2017-06-12 ENCOUNTER — Ambulatory Visit (INDEPENDENT_AMBULATORY_CARE_PROVIDER_SITE_OTHER): Payer: Medicare Other | Admitting: Adult Health

## 2017-06-12 VITALS — BP 152/70 | HR 76 | Wt 206.8 lb

## 2017-06-12 DIAGNOSIS — G4733 Obstructive sleep apnea (adult) (pediatric): Secondary | ICD-10-CM | POA: Diagnosis not present

## 2017-06-12 DIAGNOSIS — Z9989 Dependence on other enabling machines and devices: Secondary | ICD-10-CM

## 2017-06-12 NOTE — Progress Notes (Signed)
CPAP order successfully faxed to [facility].

## 2017-06-12 NOTE — Progress Notes (Addendum)
PATIENT: Rachel Crane DOB: 1946/06/30  REASON FOR VISIT: follow up- osa on cpap HISTORY FROM: patient  HISTORY OF PRESENT ILLNESS: Today 06/12/17 Ms. Kozinski is a 71 year old female with a history of obstructive sleep apnea on CPAP.  She returns today for a compliance download.  Her download indicates that she use her machine 30 out of 30 days for compliance of 100%.  She used her machine greater than 4 hours 29 out of 30 days for compliance of 97%.  On average she uses her machine 7 hours and 44 minutes.  Her residual AHI is 5.9 on 7 cm of water with EPR of 2.  She has a leak in the 95th percentile at 24.6 L/min.  She states that there are nights that she can feel the mask leaking but she just recently changed changed out her supplies and notes that the leak has improved.  She states that there sometimes when she puts on the mass she feels that it is very hot inside the mask.  She is unsure what her humidification settings are.  She returns today for an evaluation.  HISTORY 12/11/16: I reviewed her CPAP compliance data from 10/29/2016 through 11/27/2016, which is a total of 30 days but technically she had only been on treatment for 28 days but was compliant with a compliance percentage of 70%, average usage of 5 hours and 36 minutes, residual AHI borderline at 6.6, leak acceptable with the 95th percentile at 16.8 L/m on a pressure of 6 cm with EPR of 2. She reports not going to bed until late, usually between 11 PM and MN. Some nights she may not get more than 5 hours of sleep. She took benadryl for the 1st study, not for the 2nd. She feels improved in her sleep quality, and sleep consolidation, HAs much better. She has a stress test scheduled next month, d/t episodes of SOB. No CP. Used to not be able to sleep some night, now better.   REVIEW OF SYSTEMS: Out of a complete 14 system review of symptoms, the patient complains only of the following symptoms, and all other reviewed systems are  negative.  See HPI  ALLERGIES: No Known Allergies  HOME MEDICATIONS: Outpatient Medications Prior to Visit  Medication Sig Dispense Refill  . azelastine (ASTELIN) 0.1 % nasal spray Place 2 sprays into both nostrils 2 (two) times daily. (Patient taking differently: Place 2 sprays into both nostrils 2 (two) times daily as needed for rhinitis or allergies. ) 30 mL 5  . chlorthalidone (HYGROTON) 25 MG tablet Take 0.5 tablets (12.5 mg total) daily by mouth. 45 tablet 1  . clonazePAM (KLONOPIN) 0.25 MG disintegrating tablet Take 1 tablet (0.25 mg total) by mouth at bedtime. 30 tablet 0  . escitalopram (LEXAPRO) 10 MG tablet Take 1 tablet (10 mg total) by mouth daily. 30 tablet 2  . ferrous sulfate 325 (65 FE) MG tablet Take 1 tablet (325 mg total) daily with breakfast by mouth. 30 tablet 0  . fluticasone (FLONASE) 50 MCG/ACT nasal spray Place 2 sprays into both nostrils daily. 16 g 6  . fluticasone (FLONASE) 50 MCG/ACT nasal spray Place 2 sprays into both nostrils 2 (two) times daily. 16 g 5  . losartan (COZAAR) 100 MG tablet Take 1 tablet (100 mg total) by mouth daily. 90 tablet 3  . lovastatin (MEVACOR) 20 MG tablet Take 1 tablet (20 mg total) by mouth at bedtime. 90 tablet 3  . Multiple Vitamins-Minerals (ONE-A-DAY WOMENS 50 PLUS PO)  Take 1 tablet by mouth daily.     . naproxen (NAPROSYN) 500 MG tablet Take 1 tablet (500 mg total) 2 (two) times daily with a meal by mouth. 30 tablet 0  . neomycin-polymyxin-hydrocortisone (CORTISPORIN) OTIC solution Place 4 drops 4 (four) times daily into the left ear. 10 mL 0  . potassium chloride SA (K-DUR,KLOR-CON) 20 MEQ tablet Take 1 tablet (20 mEq total) daily by mouth. 30 tablet 3  . PROAIR HFA 108 (90 Base) MCG/ACT inhaler INL 2 PFS PO Q 6 H PRN  0  . triamcinolone cream (KENALOG) 0.1 % APP AA BID  0   No facility-administered medications prior to visit.     PAST MEDICAL HISTORY: Past Medical History:  Diagnosis Date  . Arthritis   . Hypertension    . OSA on CPAP     PAST SURGICAL HISTORY: Past Surgical History:  Procedure Laterality Date  . CHOLECYSTECTOMY      FAMILY HISTORY: Family History  Problem Relation Age of Onset  . Breast cancer Sister     SOCIAL HISTORY: Social History   Socioeconomic History  . Marital status: Widowed    Spouse name: Not on file  . Number of children: Not on file  . Years of education: Not on file  . Highest education level: Not on file  Social Needs  . Financial resource strain: Not on file  . Food insecurity - worry: Not on file  . Food insecurity - inability: Not on file  . Transportation needs - medical: Not on file  . Transportation needs - non-medical: Not on file  Occupational History  . Not on file  Tobacco Use  . Smoking status: Never Smoker  . Smokeless tobacco: Never Used  Substance and Sexual Activity  . Alcohol use: No  . Drug use: No  . Sexual activity: Not on file  Other Topics Concern  . Not on file  Social History Narrative  . Not on file      PHYSICAL EXAM  Vitals:   06/12/17 0926  BP: (!) 152/70  Pulse: 76  Weight: 206 lb 12.8 oz (93.8 kg)   Body mass index is 33.38 kg/m.  Generalized: Well developed, in no acute distress   Neurological examination  Mentation: Alert oriented to time, place, history taking. Follows all commands speech and language fluent Cranial nerve II-XII: Pupils were equal round reactive to light. Extraocular movements were full, visual field were full on confrontational test. Facial sensation and strength were normal. Uvula tongue midline. Head turning and shoulder shrug  were normal and symmetric. Motor: The motor testing reveals 5 over 5 strength of all 4 extremities. Good symmetric motor tone is noted throughout.  Sensory: Sensory testing is intact to soft touch on all 4 extremities. No evidence of extinction is noted.  Coordination: Cerebellar testing reveals good finger-nose-finger and heel-to-shin bilaterally.  Gait and  station: Gait is normal. Tandem gait is normal. Romberg is negative. No drift is seen.  Marland Kitchen   DIAGNOSTIC DATA (LABS, IMAGING, TESTING) - I reviewed patient records, labs, notes, testing and imaging myself where available.  Lab Results  Component Value Date   WBC 8.6 05/13/2017   HGB 11.5 (L) 05/13/2017   HCT 35.4 (L) 05/13/2017   MCV 83.1 05/13/2017   PLT 254 05/13/2017      Component Value Date/Time   NA 138 05/13/2017 1127   NA 142 04/10/2017 1510   K 3.1 (L) 05/13/2017 1127   CL 102 05/13/2017 1127  CO2 26 05/13/2017 1127   GLUCOSE 106 (H) 05/13/2017 1127   BUN 14 05/13/2017 1127   BUN 20 04/10/2017 1510   CREATININE 0.93 05/13/2017 1127   CALCIUM 9.7 05/13/2017 1127   PROT 7.2 04/10/2017 1510   ALBUMIN 4.3 04/10/2017 1510   AST 19 04/10/2017 1510   ALT 20 04/10/2017 1510   ALKPHOS 74 04/10/2017 1510   BILITOT <0.2 04/10/2017 1510   GFRNONAA >60 05/13/2017 1127   GFRAA >60 05/13/2017 1127   Lab Results  Component Value Date   CHOL 189 02/22/2017   HDL 49 02/22/2017   LDLCALC 124 (H) 02/22/2017   TRIG 78 02/22/2017   CHOLHDL 3.9 02/22/2017   Lab Results  Component Value Date   TSH 1.140 02/22/2017      ASSESSMENT AND PLAN 71 y.o. year old female  has a past medical history of Arthritis, Hypertension, and OSA on CPAP. here with:  1.  Obstructive sleep apnea on CPAP  The patient CPAP download shows excellent compliance.  Her residual AHI is slightly elevated at 5.9.  I will increase her pressure to 8 cm of water.  The patient will also take her machine to her DME company to have her humidification settings checked.  Increasing her pressure and adjusting her humidification may help with the hot sensation inside her mask.  Patient is advised that if her symptoms worsen or she develops new symptoms she should let us know.  She will follow-up in 6 months or sooner if needed.     Ward Givens, MSN, NP-C 06/12/2017, 9:38 AM [facility] [address] [place], [place] [address] [phone]  I reviewed the above note and documentation by the Nurse Practitioner and agree with the history, physical exam, assessment and plan as outlined above. I was immediately available for face-to-face consultation. Star Age, MD, PhD [facility])

## 2017-06-12 NOTE — Patient Instructions (Signed)
Your Plan:  Continue using CPAP nightly  Increase pressure to 8 cmH20  Humidification setting may need to decrease- DME will do this If your symptoms worsen or you develop new symptoms please let us know.   Thank you for coming to see [place] at [facility]. I hope we have been able to provide you high quality care today.  You may receive a patient satisfaction survey over the next few weeks. We would appreciate your feedback and comments so that we may continue to improve ourselves and the health of our patients.

## 2017-06-17 ENCOUNTER — Other Ambulatory Visit (INDEPENDENT_AMBULATORY_CARE_PROVIDER_SITE_OTHER): Payer: Self-pay | Admitting: Physician Assistant

## 2017-06-17 NOTE — Telephone Encounter (Signed)
FWD to PCP. Colie Josten S Thedore Pickel, CMA  

## 2017-06-18 ENCOUNTER — Other Ambulatory Visit: Payer: Self-pay

## 2017-06-18 ENCOUNTER — Other Ambulatory Visit (INDEPENDENT_AMBULATORY_CARE_PROVIDER_SITE_OTHER): Payer: Self-pay | Admitting: Physician Assistant

## 2017-06-18 ENCOUNTER — Encounter (INDEPENDENT_AMBULATORY_CARE_PROVIDER_SITE_OTHER): Payer: Self-pay | Admitting: Physician Assistant

## 2017-06-18 ENCOUNTER — Ambulatory Visit (INDEPENDENT_AMBULATORY_CARE_PROVIDER_SITE_OTHER): Payer: Medicare Other | Admitting: Physician Assistant

## 2017-06-18 VITALS — BP 123/77 | HR 66 | Temp 98.3°F | Wt 207.6 lb

## 2017-06-18 DIAGNOSIS — I83893 Varicose veins of bilateral lower extremities with other complications: Secondary | ICD-10-CM

## 2017-06-18 DIAGNOSIS — E876 Hypokalemia: Secondary | ICD-10-CM

## 2017-06-18 DIAGNOSIS — D649 Anemia, unspecified: Secondary | ICD-10-CM

## 2017-06-18 DIAGNOSIS — E2839 Other primary ovarian failure: Secondary | ICD-10-CM | POA: Diagnosis not present

## 2017-06-18 DIAGNOSIS — R0981 Nasal congestion: Secondary | ICD-10-CM

## 2017-06-18 MED ORDER — FLUTICASONE PROPIONATE 50 MCG/ACT NA SUSP: 2.0000 | Freq: Every day | NASAL | 6 refills | Status: DC

## 2017-06-18 MED ORDER — NAPROXEN 500 MG PO TABS: 500.0000 mg | ORAL_TABLET | Freq: Two times a day (BID) | ORAL | 0 refills | Status: DC

## 2017-06-18 NOTE — Progress Notes (Signed)
Subjective:  Patient ID: Rachel Crane, female    DOB: 1945/11/08  Age: 71 y.o. MRN: [id number]  CC: f/u anemia and hypokalemia  HPI   Rachel Crane a 71 y.o.femalewith a PMH of OSA and HTN presents on f/u of hypokalemia and anemia. She was started on ferrous sulfate and advised to take half of her chlorthalidone 25 mg. Reports taking medications as directed. Complains only of occasional pain in her varicose veins of the left leg. Happens when she rides her bicycle or stands for prolonged periods. Has not used compression stockings or NSAIDs for varicose vein pain. Does not endorse any other complaints or issues.     Outpatient Medications Prior to Visit  Medication Sig Dispense Refill  . ferrous sulfate 325 (65 FE) MG tablet Take 1 tablet (325 mg total) daily with breakfast by mouth. 30 tablet 0  . fluticasone (FLONASE) 50 MCG/ACT nasal spray Place 2 sprays into both nostrils 2 (two) times daily. 16 g 5  . losartan (COZAAR) 100 MG tablet Take 1 tablet (100 mg total) by mouth daily. 90 tablet 3  . lovastatin (MEVACOR) 20 MG tablet Take 1 tablet (20 mg total) by mouth at bedtime. 90 tablet 3  . naproxen (NAPROSYN) 500 MG tablet Take 1 tablet (500 mg total) 2 (two) times daily with a meal by mouth. 30 tablet 0  . neomycin-polymyxin-hydrocortisone (CORTISPORIN) OTIC solution Place 4 drops 4 (four) times daily into the left ear. 10 mL 0  . potassium chloride SA (K-DUR,KLOR-CON) 20 MEQ tablet Take 1 tablet (20 mEq total) daily by mouth. 30 tablet 3  . PROAIR HFA 108 (90 Base) MCG/ACT inhaler INL 2 PFS PO Q 6 H PRN  0  . triamcinolone cream (KENALOG) 0.1 % APPLY TO THE AFFECTED AREA TWICE DAILY 454 g 0  . azelastine (ASTELIN) 0.1 % nasal spray Place 2 sprays into both nostrils 2 (two) times daily. (Patient taking differently: Place 2 sprays into both nostrils 2 (two) times daily as needed for rhinitis or allergies. ) 30 mL 5  . chlorthalidone (HYGROTON) 25 MG tablet Take 0.5 tablets (12.5 mg  total) daily by mouth. 45 tablet 1  . clonazePAM (KLONOPIN) 0.25 MG disintegrating tablet Take 1 tablet (0.25 mg total) by mouth at bedtime. (Patient not taking: Reported on 06/18/2017) 30 tablet 0  . escitalopram (LEXAPRO) 10 MG tablet Take 1 tablet (10 mg total) by mouth daily. 30 tablet 2  . fluticasone (FLONASE) 50 MCG/ACT nasal spray Place 2 sprays into both nostrils daily. (Patient not taking: Reported on 06/18/2017) 16 g 6  . Multiple Vitamins-Minerals (ONE-A-DAY WOMENS 50 PLUS PO) Take 1 tablet by mouth daily.      No facility-administered medications prior to visit.      ROS Review of Systems  Constitutional: Negative for chills, fever and malaise/fatigue.  Eyes: Negative for blurred vision.  Respiratory: Negative for shortness of breath.   Cardiovascular: Negative for chest pain and palpitations.  Gastrointestinal: Negative for abdominal pain and nausea.  Genitourinary: Negative for dysuria and hematuria.  Musculoskeletal: Negative for joint pain and myalgias.  Skin: Negative for rash.  Neurological: Negative for tingling and headaches.  Psychiatric/Behavioral: Negative for depression. The patient is not nervous/anxious.     Objective:  BP 123/77 (BP Location: Left Arm, Patient Position: Sitting, Cuff Size: Large)   Pulse 66   Temp 98.3 F (36.8 C) (Oral)   Wt 207 lb 9.6 oz (94.2 kg)   SpO2 97%   BMI 33.51 kg/m  BP/Weight 06/18/2017 06/12/2017 41/08/8784  Systolic BP 767 209 470  Diastolic BP 77 70 64  Wt. (Lbs) 207.6 206.8 207.6  BMI 33.51 33.38 33.51      Physical Exam  Constitutional: She is oriented to person, place, and time.  Well developed, well nourished, NAD, polite  HENT:  Head: Normocephalic and atraumatic.  Eyes: No scleral icterus.  Neck: Normal range of motion. Neck supple. No thyromegaly present.  Cardiovascular: Normal rate, regular rhythm and normal heart sounds.  Spider veins and small varicose veins on the left and right lower extremities.    Pulmonary/Chest: Effort normal and breath sounds normal.  Abdominal: Soft. Bowel sounds are normal. There is no tenderness.  Musculoskeletal: She exhibits no edema.  Neurological: She is alert and oriented to person, place, and time. No cranial nerve deficit. Coordination normal.  Skin: Skin is warm and dry. No rash noted. No erythema. No pallor.  Psychiatric: She has a normal mood and affect. Her behavior is normal. Thought content normal.  Vitals reviewed.    Assessment & Plan:     1. Hypokalemia - Basic Metabolic Panel  2. Anemia, unspecified type - CBC with Differential  3. Varicose veins of bilateral lower extremities with other complications - Refill naproxen (NAPROSYN) 500 MG tablet; Take 1 tablet (500 mg total) by mouth 2 (two) times daily with a meal.  Dispense: 45 tablet; Refill: 0 - Compression stockings  4. Estrogen deficiency - DG Bone Density; Future  5. Nasal congestio - Refill fluticasone (FLONASE) 50 MCG/ACT nasal spray; Place 2 sprays into both nostrils daily.  Dispense: 16 g; Refill: 6   Meds ordered this encounter  Medications  . naproxen (NAPROSYN) 500 MG tablet    Sig: Take 1 tablet (500 mg total) by mouth 2 (two) times daily with a meal.    Dispense:  45 tablet    Refill:  0    Order Specific Question:   Supervising Provider    Answer:   Tresa Garter [id number]  . fluticasone (FLONASE) 50 MCG/ACT nasal spray    Sig: Place 2 sprays into both nostrils daily.    Dispense:  16 g    Refill:  6    Order Specific Question:   Supervising Provider    Answer:   Tresa Garter [[id number]]    Follow-up: Return in about 4 months (around 10/17/2017) for HTN.   Clent Demark PA

## 2017-06-18 NOTE — Patient Instructions (Signed)
Varicose Veins Varicose veins are veins that have become enlarged and twisted. They are usually seen in the legs but can occur in other parts of the body as well. What are the causes? This condition is the result of valves in the veins not working properly. Valves in the veins help to return blood from the leg to the heart. If these valves are damaged, blood flows backward and backs up into the veins in the leg near the skin. This causes the veins to become larger. What increases the risk? People who are on their feet a lot, who are pregnant, or who are overweight are more likely to develop varicose veins. What are the signs or symptoms?  Bulging, twisted-appearing, bluish veins, most commonly found on the legs.  Leg pain or a feeling of heaviness. These symptoms may be worse at the end of the day.  Leg swelling.  Changes in skin color. How is this diagnosed? A health care provider can usually diagnose varicose veins by examining your legs. Your health care provider may also recommend an ultrasound of your leg veins. How is this treated? Most varicose veins can be treated at home.However, other treatments are available for people who have persistent symptoms or want to improve the cosmetic appearance of the varicose veins. These treatment options include:  Sclerotherapy. A solution is injected into the vein to close it off.  Laser treatment. A laser is used to heat the vein to close it off.  Radiofrequency vein ablation. An electrical current produced by radio waves is used to close off the vein.  Phlebectomy. The vein is surgically removed through small incisions made over the varicose vein.  Vein ligation and stripping. The vein is surgically removed through incisions made over the varicose vein after the vein has been tied (ligated). Follow these instructions at home:   Do not stand or sit in one position for long periods of time. Do not sit with your legs crossed. Rest with your  legs raised during the day.  Wear compression stockings as directed by your health care provider. These stockings help to prevent blood clots and reduce swelling in your legs.  Do not wear other tight, encircling garments around your legs, pelvis, or waist.  Walk as much as possible to increase blood flow.  Raise the foot of your bed at night with 2-inch blocks.  If you get a cut in the skin over the vein and the vein bleeds, lie down with your leg raised and press on it with a clean cloth until the bleeding stops. Then place a bandage (dressing) on the cut. See your health care provider if it continues to bleed. Contact a health care provider if:  The skin around your ankle starts to break down.  You have pain, redness, tenderness, or hard swelling in your leg over a vein.  You are uncomfortable because of leg pain. This information is not intended to replace advice given to you by your health care provider. Make sure you discuss any questions you have with your health care provider. Document Released: 04/11/2005 Document Revised: 12/08/2015 Document Reviewed: 01/03/2016 Elsevier Interactive Patient Education  2017 [employer]  

## 2017-06-19 LAB — BASIC METABOLIC PANEL
BUN/Creatinine Ratio: 14 (ref 12–28)
BUN: 12 mg/dL (ref 8–27)
CO2: 30 mmol/L — ABNORMAL HIGH (ref 20–29)
Calcium: 9.4 mg/dL (ref 8.7–10.3)
Chloride: 100 mmol/L (ref 96–106)
Creatinine, Ser: 0.88 mg/dL (ref 0.57–1.00)
GFR calc Af Amer: 78 mL/min/{1.73_m2} (ref >59)
GFR calc non Af Amer: 67 mL/min/{1.73_m2} (ref >59)
Glucose: 106 mg/dL — ABNORMAL HIGH (ref 65–99)
Potassium: 3.6 mmol/L (ref 3.5–5.2)
Sodium: 142 mmol/L (ref 134–144)

## 2017-06-19 LAB — CBC WITH DIFFERENTIAL/PLATELET
Basophils Absolute: 0 10*3/uL (ref 0.0–0.2)
Basos: 0 %
EOS (ABSOLUTE): 0.1 10*3/uL (ref 0.0–0.4)
Eos: 1 %
Hematocrit: 34.6 % (ref 34.0–46.6)
Hemoglobin: 11.3 g/dL (ref 11.1–15.9)
Immature Grans (Abs): 0 10*3/uL (ref 0.0–0.1)
Immature Granulocytes: 0 %
Lymphocytes Absolute: 2.8 10*3/uL (ref 0.7–3.1)
Lymphs: 38 %
MCH: 26.5 pg — ABNORMAL LOW (ref 26.6–33.0)
MCHC: 32.7 g/dL (ref 31.5–35.7)
MCV: 81 fL (ref 79–97)
Monocytes Absolute: 0.4 10*3/uL (ref 0.1–0.9)
Monocytes: 5 %
Neutrophils Absolute: 4 10*3/uL (ref 1.4–7.0)
Neutrophils: 56 %
Platelets: 213 10*3/uL (ref 150–379)
RBC: 4.26 x10E6/uL (ref 3.77–5.28)
RDW: 15 % (ref 12.3–15.4)
WBC: 7.3 10*3/uL (ref 3.4–10.8)

## 2017-06-21 ENCOUNTER — Telehealth (INDEPENDENT_AMBULATORY_CARE_PROVIDER_SITE_OTHER): Payer: Self-pay

## 2017-06-21 NOTE — Telephone Encounter (Signed)
Patient aware of results. Nat Christen, CMA

## 2017-06-21 NOTE — Telephone Encounter (Signed)
-----   Message from Clent Demark, PA-C sent at 06/19/2017  5:49 PM EST ----- The results are unremarkable.

## 2017-07-26 ENCOUNTER — Inpatient Hospital Stay: Admission: RE | Admit: 2017-07-26 | Payer: Medicare Other | Source: Ambulatory Visit

## 2017-08-05 ENCOUNTER — Other Ambulatory Visit (INDEPENDENT_AMBULATORY_CARE_PROVIDER_SITE_OTHER): Payer: Self-pay | Admitting: Physician Assistant

## 2017-08-05 ENCOUNTER — Encounter (INDEPENDENT_AMBULATORY_CARE_PROVIDER_SITE_OTHER): Payer: Self-pay | Admitting: Physician Assistant

## 2017-08-05 ENCOUNTER — Other Ambulatory Visit: Payer: Self-pay

## 2017-08-05 ENCOUNTER — Ambulatory Visit (INDEPENDENT_AMBULATORY_CARE_PROVIDER_SITE_OTHER): Payer: Medicare Other | Admitting: Physician Assistant

## 2017-08-05 VITALS — BP 132/59 | HR 63 | Temp 98.3°F | Wt 203.8 lb

## 2017-08-05 DIAGNOSIS — G8929 Other chronic pain: Secondary | ICD-10-CM | POA: Diagnosis not present

## 2017-08-05 DIAGNOSIS — M25562 Pain in left knee: Secondary | ICD-10-CM

## 2017-08-05 DIAGNOSIS — M25561 Pain in right knee: Secondary | ICD-10-CM

## 2017-08-05 MED ORDER — ACETAMINOPHEN-CODEINE #3 300-30 MG PO TABS: 1.0000 | ORAL_TABLET | Freq: Three times a day (TID) | ORAL | 0 refills | Status: AC | PRN

## 2017-08-05 MED ORDER — MELOXICAM 7.5 MG PO TABS: 7.5000 mg | ORAL_TABLET | Freq: Every day | ORAL | 0 refills | Status: DC

## 2017-08-05 NOTE — Progress Notes (Signed)
Pt complains that she can not drive because her right foot goes numb Pt can not stand for long periods of time due to knee weakness and feeling like she is going to fall  Pt states pain is at highest upon waking up

## 2017-08-05 NOTE — Patient Instructions (Signed)

## 2017-08-05 NOTE — Progress Notes (Signed)
Subjective:  Patient ID: Rachel Crane, female    DOB: 06-01-1946  Age: 72 y.o. MRN: [id number]  CC: knee pain  HPI  Zonnie Landen a 72 y.o.femalewith a PMH of OSA and HTN presents with bilateral knee pain. Pain is located in the knee bilaterally. Says she used to receive bilateral intra-articular injections over a two year time span in six months intervals beginning in 2011 in [place]. Injection offered short term relief. She was offered surgery but never decided on having surgery as she was somewhat fearful/apprehensive of the surgery. Symptoms include locking of the knee and knee giving away. Says Naproxen that was prescribed for the varicosities was somewhat helpful in relieving bilateral knee pain. Has previously complained of occasional pain of the varicosities of the left leg. Pain of the varicosities usually felt when she rides her bicycle or when standing for prolonged periods. Was advised to use compression stockings but has not began using yet as the store she went to did not have in stock.      Outpatient Medications Prior to Visit  Medication Sig Dispense Refill  . escitalopram (LEXAPRO) 10 MG tablet Take 1 tablet (10 mg total) by mouth daily. 30 tablet 2  . losartan (COZAAR) 100 MG tablet Take 1 tablet (100 mg total) by mouth daily. 90 tablet 3  . Multiple Vitamins-Minerals (ONE-A-DAY WOMENS 50 PLUS PO) Take 1 tablet by mouth daily.     . naproxen (NAPROSYN) 500 MG tablet Take 1 tablet (500 mg total) by mouth 2 (two) times daily with a meal. 45 tablet 0  . neomycin-polymyxin-hydrocortisone (CORTISPORIN) OTIC solution Place 4 drops 4 (four) times daily into the left ear. 10 mL 0  . PROAIR HFA 108 (90 Base) MCG/ACT inhaler INL 2 PFS PO Q 6 H PRN  0  . triamcinolone cream (KENALOG) 0.1 % APPLY TO THE AFFECTED AREA TWICE DAILY 454 g 0  . azelastine (ASTELIN) 0.1 % nasal spray Place 2 sprays into both nostrils 2 (two) times daily. (Patient not taking: Reported on 08/05/2017) 30 mL 5  .  chlorthalidone (HYGROTON) 25 MG tablet Take 0.5 tablets (12.5 mg total) daily by mouth. (Patient not taking: Reported on 08/05/2017) 45 tablet 1  . clonazePAM (KLONOPIN) 0.25 MG disintegrating tablet Take 1 tablet (0.25 mg total) by mouth at bedtime. (Patient not taking: Reported on 06/18/2017) 30 tablet 0  . ferrous sulfate 325 (65 FE) MG tablet Take 1 tablet (325 mg total) daily with breakfast by mouth. (Patient not taking: Reported on 08/05/2017) 30 tablet 0  . fluticasone (FLONASE) 50 MCG/ACT nasal spray Place 2 sprays into both nostrils daily. (Patient not taking: Reported on 08/05/2017) 16 g 6  . lovastatin (MEVACOR) 20 MG tablet Take 1 tablet (20 mg total) by mouth at bedtime. (Patient not taking: Reported on 08/05/2017) 90 tablet 3  . potassium chloride SA (K-DUR,KLOR-CON) 20 MEQ tablet Take 1 tablet (20 mEq total) daily by mouth. (Patient not taking: Reported on 08/05/2017) 30 tablet 3   No facility-administered medications prior to visit.      ROS Review of Systems  Constitutional: Negative for chills, fever and malaise/fatigue.  Eyes: Negative for blurred vision.  Respiratory: Negative for shortness of breath.   Cardiovascular: Negative for chest pain and palpitations.  Gastrointestinal: Negative for abdominal pain and nausea.  Genitourinary: Negative for dysuria and hematuria.  Musculoskeletal: Positive for joint pain. Negative for myalgias.  Skin: Negative for rash.  Neurological: Negative for tingling and headaches.  Psychiatric/Behavioral: Negative for depression.  The patient is not nervous/anxious.     Objective:  Wt 203 lb 12.8 oz (92.4 kg)   BMI 32.89 kg/m   BP/Weight 08/05/2017 06/18/2017 92/33/0076  Systolic BP - 226 333  Diastolic BP - 77 70  Wt. (Lbs) 203.8 207.6 206.8  BMI 32.89 33.51 33.38      Physical Exam  Constitutional: She is oriented to person, place, and time.  Well developed, overweight, NAD, polite  HENT:  Head: Normocephalic and atraumatic.   Eyes: No scleral icterus.  Neck: Normal range of motion.  Cardiovascular: Normal rate, regular rhythm and normal heart sounds.  Pulmonary/Chest: Effort normal and breath sounds normal.  Abdominal: Soft. Bowel sounds are normal. There is no tenderness.  Musculoskeletal: She exhibits no edema.  Bilateral bony hypertrophic changes of the knee.   Neurological: She is alert and oriented to person, place, and time. No cranial nerve deficit. Coordination normal.  Skin: Skin is warm and dry. No rash noted. No erythema. No pallor.  Psychiatric: She has a normal mood and affect. Her behavior is normal. Thought content normal.  Vitals reviewed.    Assessment & Plan:    1. Chronic pain of both knees - Highly suspect for OA. - Begin Meloxicam 7.5 mg qday - Begin Tylenol #3 q8hrs PRN - DG Knee Complete 4 Views Left; Future - DG Knee Complete 4 Views Right; Future - Ambulatory referral to orthopedics  Meds ordered this encounter  Medications  . meloxicam (MOBIC) 7.5 MG tablet    Sig: Take 1 tablet (7.5 mg total) by mouth daily.    Dispense:  30 tablet    Refill:  0    Order Specific Question:   Supervising Provider    Answer:   Tresa Garter [id number]  . acetaminophen-codeine (TYLENOL #3) 300-30 MG tablet    Sig: Take 1 tablet by mouth every 8 (eight) hours as needed for moderate pain.    Dispense:  90 tablet    Refill:  0    Order Specific Question:   Supervising Provider    Answer:   Tresa Garter [id number]    Follow-up: Return in about 6 weeks (around 09/16/2017).   Clent Demark PA

## 2017-08-09 ENCOUNTER — Other Ambulatory Visit: Payer: Self-pay

## 2017-08-09 ENCOUNTER — Ambulatory Visit (HOSPITAL_COMMUNITY)
Admission: RE | Admit: 2017-08-09 | Discharge: 2017-08-09 | Disposition: A | Discharge status: Home / Self Care | Payer: Medicare Other | Source: Ambulatory Visit | Attending: Physician Assistant | Admitting: Physician Assistant

## 2017-08-09 DIAGNOSIS — G8929 Other chronic pain: Secondary | ICD-10-CM | POA: Insufficient documentation

## 2017-08-09 DIAGNOSIS — D480 Neoplasm of uncertain behavior of bone and articular cartilage: Secondary | ICD-10-CM | POA: Insufficient documentation

## 2017-08-09 DIAGNOSIS — M179 Osteoarthritis of knee, unspecified: Secondary | ICD-10-CM | POA: Diagnosis not present

## 2017-08-09 DIAGNOSIS — M8588 Other specified disorders of bone density and structure, other site: Secondary | ICD-10-CM | POA: Diagnosis not present

## 2017-08-09 DIAGNOSIS — M170 Bilateral primary osteoarthritis of knee: Secondary | ICD-10-CM | POA: Diagnosis not present

## 2017-08-09 DIAGNOSIS — M25562 Pain in left knee: Secondary | ICD-10-CM | POA: Diagnosis present

## 2017-08-09 DIAGNOSIS — M25561 Pain in right knee: Secondary | ICD-10-CM

## 2017-08-13 ENCOUNTER — Ambulatory Visit (INDEPENDENT_AMBULATORY_CARE_PROVIDER_SITE_OTHER): Payer: Medicare Other | Admitting: Orthopaedic Surgery

## 2017-08-13 ENCOUNTER — Encounter (INDEPENDENT_AMBULATORY_CARE_PROVIDER_SITE_OTHER): Payer: Self-pay | Admitting: Orthopaedic Surgery

## 2017-08-13 DIAGNOSIS — M1712 Unilateral primary osteoarthritis, left knee: Secondary | ICD-10-CM | POA: Diagnosis not present

## 2017-08-13 DIAGNOSIS — M1711 Unilateral primary osteoarthritis, right knee: Secondary | ICD-10-CM

## 2017-08-13 NOTE — Progress Notes (Signed)
Office Visit Note   Patient: Rachel Crane           Date of Birth: Jul 07, 1946           MRN: [id number] Visit Date: 08/13/2017              Requested by: Clent Demark, PA-C [place], [place] [id number] PCP: Clent Demark, PA-C   Assessment & Plan: Visit Diagnoses:  1. Unilateral primary osteoarthritis, right knee   2. Unilateral primary osteoarthritis, left knee     Plan: Impression is degenerative joint disease bilateral knees.  Patient has advanced arthritis and failure of conservative treatment.  At this point she would like to proceed with total knee replacement.  She will give [place] a call when she is ready to schedule.  We discussed the risk benefits alternatives of surgery and the expected outcome and recovery..  Denies a history of DVT. Total face to face encounter time was greater than 45 minutes and over half of this time was spent in counseling and/or coordination of care. Follow-Up Instructions: Return if symptoms worsen or fail to improve.   Orders:  No orders of the defined types were placed in this encounter.  No orders of the defined types were placed in this encounter.     Procedures: No procedures performed   Clinical Data: No additional findings.   Subjective: Chief Complaint  Patient presents with  . Right Knee - Pain  . Left Knee - Pain    Patient is a 72 year old female comes in with bilateral knee pain worse on the right.  She has had extensive conservative treatment including injections and physical therapy and NSAIDs.  She continues to have difficulty with ADLs and chronic night pain.  She is currently working part-time at [employer]    Review of Systems  Constitutional: Negative.   HENT: Negative.   Eyes: Negative.   Respiratory: Negative.   Cardiovascular: Negative.   Endocrine: Negative.   Musculoskeletal: Negative.   Neurological: Negative.   Hematological: Negative.   Psychiatric/Behavioral: Negative.   All  other systems reviewed and are negative.    Objective: Vital Signs: There were no vitals taken for this visit.  Physical Exam  Constitutional: She is oriented to person, place, and time. She appears well-developed and well-nourished.  HENT:  Head: Normocephalic and atraumatic.  Eyes: EOM are normal.  Neck: Neck supple.  Pulmonary/Chest: Effort normal.  Abdominal: Soft.  Neurological: She is alert and oriented to person, place, and time.  Skin: Skin is warm. Capillary refill takes less than 2 seconds.  Psychiatric: She has a normal mood and affect. Her behavior is normal. Judgment and thought content normal.  Nursing note and vitals reviewed.   Ortho Exam Bilateral knee exam shows no joint effusion.  Preserved joint motion.  Collaterals and cruciates are stable. Specialty Comments:  No specialty comments available.  Imaging: No results found.   PMFS History: Patient Active Problem List   Diagnosis Date Noted  . Sprain of anterior talofibular ligament of right ankle 12/13/2016   Past Medical History:  Diagnosis Date  . Arthritis   . Hypertension   . OSA on CPAP     Family History  Problem Relation Age of Onset  . Breast cancer Sister     Past Surgical History:  Procedure Laterality Date  . CHOLECYSTECTOMY     Social History   Occupational History  . Not on file  Tobacco Use  . Smoking status:  Never Smoker  . Smokeless tobacco: Never Used  Substance and Sexual Activity  . Alcohol use: No  . Drug use: No  . Sexual activity: Not on file

## 2017-08-14 ENCOUNTER — Telehealth (INDEPENDENT_AMBULATORY_CARE_PROVIDER_SITE_OTHER): Payer: Self-pay | Admitting: *Deleted

## 2017-08-14 NOTE — Telephone Encounter (Signed)
Patient verified DOB Patient is aware of osteoarthic changes and being referred to orthopedic. Patient saw the orthopedic and has a bone density scheduled for 08/20/17 at 8:00am. No further questions.

## 2017-08-14 NOTE — Telephone Encounter (Signed)
-----   Message from Clent Demark, PA-C sent at 08/12/2017  8:50 AM EST ----- Osteoarthritic changes that need to be evaluated by orthopedic specialist. I have already referred.

## 2017-08-20 ENCOUNTER — Telehealth (INDEPENDENT_AMBULATORY_CARE_PROVIDER_SITE_OTHER): Payer: Self-pay | Admitting: *Deleted

## 2017-08-20 ENCOUNTER — Ambulatory Visit
Admission: RE | Admit: 2017-08-20 | Discharge: 2017-08-20 | Disposition: A | Discharge status: Home / Self Care | Payer: Medicare Other | Source: Ambulatory Visit | Attending: Physician Assistant | Admitting: Physician Assistant

## 2017-08-20 DIAGNOSIS — Z780 Asymptomatic menopausal state: Secondary | ICD-10-CM | POA: Diagnosis not present

## 2017-08-20 DIAGNOSIS — E2839 Other primary ovarian failure: Secondary | ICD-10-CM

## 2017-08-20 NOTE — Telephone Encounter (Signed)
Patient verified DOB Patient is aware of bone density being normal. No further questions.

## 2017-08-20 NOTE — Telephone Encounter (Signed)
-----   Message from Clent Demark, PA-C sent at 08/20/2017 12:29 PM EST ----- Bone density results normal.

## 2017-09-10 ENCOUNTER — Other Ambulatory Visit (HOSPITAL_COMMUNITY): Payer: Medicare Other

## 2017-09-10 ENCOUNTER — Ambulatory Visit (INDEPENDENT_AMBULATORY_CARE_PROVIDER_SITE_OTHER): Payer: Medicare Other | Admitting: Physician Assistant

## 2017-09-10 ENCOUNTER — Encounter (INDEPENDENT_AMBULATORY_CARE_PROVIDER_SITE_OTHER): Payer: Self-pay | Admitting: Physician Assistant

## 2017-09-10 VITALS — BP 124/68 | HR 75 | Temp 98.1°F | Resp 18 | Ht 66.0 in | Wt 203.0 lb

## 2017-09-10 DIAGNOSIS — G8929 Other chronic pain: Secondary | ICD-10-CM

## 2017-09-10 DIAGNOSIS — Z01818 Encounter for other preprocedural examination: Secondary | ICD-10-CM | POA: Diagnosis not present

## 2017-09-10 DIAGNOSIS — M25561 Pain in right knee: Secondary | ICD-10-CM

## 2017-09-10 LAB — POCT URINALYSIS DIPSTICK
Bilirubin, UA: NEGATIVE
Blood, UA: NEGATIVE
Glucose, UA: NEGATIVE
Ketones, UA: NEGATIVE
Nitrite, UA: NEGATIVE
Protein, UA: NEGATIVE
Spec Grav, UA: 1.02 (ref 1.010–1.025)
Urobilinogen, UA: 0.2 E.U./dL
pH, UA: 6 (ref 5.0–8.0)

## 2017-09-10 MED ORDER — HYDROCODONE-ACETAMINOPHEN 2.5-325 MG PO TABS: 1.0000 | ORAL_TABLET | Freq: Two times a day (BID) | ORAL | 0 refills | Status: DC

## 2017-09-10 NOTE — Pre-Procedure Instructions (Signed)
Rachel Crane  09/10/2017      [facility] - [address], [address] [place] [address] Phone: [phone] Fax: [phone]    Your procedure is scheduled on March 7  Report to [facility] Admitting at 1250 P.M.  Call this number if you have problems the morning of surgery:  [phone]   Remember:  Do not eat food or drink liquids after midnight.  Continue all medications as directed by your physician except follow these medication instructions before surgery below   Take these medicines the morning of surgery with A SIP OF WATER  azelastine (ASTELIN)  7 days prior to surgery STOP taking any Aspirin(unless otherwise instructed by your surgeon), Aleve, Naproxen, Ibuprofen, Motrin, Advil, Goody's, BC's, all herbal medications, fish oil, and all vitamins    Do not wear jewelry, make-up or nail polish.  Do not wear lotions, powders, or perfumes, or deodorant.  Do not shave 48 hours prior to surgery.    Do not bring valuables to the hospital.  [facility] is not responsible for any belongings or valuables.  Contacts, dentures or bridgework may not be worn into surgery.  Leave your suitcase in the car.  After surgery it may be brought to your room.  For patients admitted to the hospital, discharge time will be determined by your treatment team.  Patients discharged the day of surgery will not be allowed to drive home.    Special instructions:   [facility]- Preparing For Surgery  Before surgery, you can play an important role. Because skin is not sterile, your skin needs to be as free of germs as possible. You can reduce the number of germs on your skin by washing with CHG (chlorahexidine gluconate) Soap before surgery.  CHG is an antiseptic cleaner which kills germs and bonds with the skin to continue killing germs even after washing.  Please do not use if you have an allergy to  CHG or antibacterial soaps. If your skin becomes reddened/irritated stop using the CHG.  Do not shave (including legs and underarms) for at least 48 hours prior to first CHG shower. It is OK to shave your face.  Please follow these instructions carefully.   1. Shower the NIGHT BEFORE SURGERY and the MORNING OF SURGERY with CHG.   2. If you chose to wash your hair, wash your hair first as usual with your normal shampoo.  3. After you shampoo, rinse your hair and body thoroughly to remove the shampoo.  4. Use CHG as you would any other liquid soap. You can apply CHG directly to the skin and wash gently with a scrungie or a clean washcloth.   5. Apply the CHG Soap to your body ONLY FROM THE NECK DOWN.  Do not use on open wounds or open sores. Avoid contact with your eyes, ears, mouth and genitals (private parts). Wash Face and genitals (private parts)  with your normal soap.  6. Wash thoroughly, paying special attention to the area where your surgery will be performed.  7. Thoroughly rinse your body with warm water from the neck down.  8. DO NOT shower/wash with your normal soap after using and rinsing off the CHG Soap.  9. Pat yourself dry with a CLEAN TOWEL.  10. Wear CLEAN PAJAMAS to bed the night before surgery, wear comfortable clothes the morning of surgery  11. Place CLEAN SHEETS  on your bed the night of your first shower and DO NOT SLEEP WITH PETS.    Day of Surgery: Do not apply any deodorants/lotions. Please wear clean clothes to the hospital/surgery center.      Please read over the following fact sheets that you were given.

## 2017-09-10 NOTE — Progress Notes (Signed)
Subjective:  Patient ID: Rachel Crane, female    DOB: May 04, 1946  Age: 72 y.o. MRN: [id number]  CC: medical clearance  HPI Rachel Crane a 72 y.o.femaleewith a PMH of OSA and HTN presents for a medical clearance for her total knee replacement scheduled in eight days. Says she is nervous about the procedure but is ready to have chronic knee pain eliminated. She takes ibuprofen on a consistent basis for some pain relief. No gastritis, melena, nausea, vomiting. Denies CP, palpitations, SOB, HA, abdominal pain, fever, chills, rash, swelling, diarrhea, constipation, or GU sxs.      Outpatient Medications Prior to Visit  Medication Sig Dispense Refill  . azelastine (ASTELIN) 0.1 % nasal spray Place 2 sprays into both nostrils 2 (two) times daily. 30 mL 5  . cholecalciferol (VITAMIN D) 1000 units tablet Take 1,000 Units by mouth daily.    Marland Kitchen losartan (COZAAR) 100 MG tablet Take 1 tablet (100 mg total) by mouth daily. 90 tablet 3  . chlorthalidone (HYGROTON) 25 MG tablet Take 0.5 tablets (12.5 mg total) daily by mouth. (Patient not taking: Reported on 09/10/2017) 45 tablet 1  . lovastatin (MEVACOR) 20 MG tablet Take 1 tablet (20 mg total) by mouth at bedtime. (Patient not taking: Reported on 09/10/2017) 90 tablet 3   No facility-administered medications prior to visit.      ROS Review of Systems  Constitutional: Negative for chills, fever and malaise/fatigue.  Eyes: Negative for blurred vision.  Respiratory: Negative for shortness of breath.   Cardiovascular: Negative for chest pain and palpitations.  Gastrointestinal: Negative for abdominal pain and nausea.  Genitourinary: Negative for dysuria and hematuria.  Musculoskeletal: Positive for joint pain. Negative for myalgias.  Skin: Negative for rash.  Neurological: Negative for tingling and headaches.  Psychiatric/Behavioral: Negative for depression. The patient is not nervous/anxious.     Objective:  BP 124/68 (BP Location: Right Arm,  Patient Position: Sitting, Cuff Size: Large)   Pulse 75   Temp 98.1 F (36.7 C) (Oral)   Resp 18   Ht 5\' 6"  (1.676 m)   Wt 203 lb (92.1 kg)   SpO2 97%   BMI 32.77 kg/m   BP/Weight 09/10/2017 08/05/2017 35/0/0938  Systolic BP 182 993 716  Diastolic BP 68 59 77  Wt. (Lbs) 203 203.8 207.6  BMI 32.77 32.89 33.51      Physical Exam  Constitutional: She is oriented to person, place, and time.  Well developed, well nourished, NAD, polite  HENT:  Head: Normocephalic and atraumatic.  Eyes: No scleral icterus.  Neck: Normal range of motion. Neck supple. No thyromegaly present.  Cardiovascular: Normal rate, regular rhythm and normal heart sounds.  Pulmonary/Chest: Effort normal and breath sounds normal.  Abdominal: Soft. Bowel sounds are normal. There is no tenderness.  Musculoskeletal: She exhibits no edema.  Mildly limited aROM of right knee  Neurological: She is alert and oriented to person, place, and time.  Mildly antalgic gait favoring the right side.  Skin: Skin is warm and dry. No rash noted. No erythema. No pallor.  Psychiatric: She has a normal mood and affect. Her behavior is normal. Thought content normal.  Vitals reviewed.    Assessment & Plan:    1. Pre-op evaluation - Preparing for total knee replacement in 8 days.  - Comprehensive metabolic panel - CBC with Differential - Urinalysis Dipstick - PT AND PTT - DG Chest 2 View; Future - EKG 12-Lead - ABO AND RH   2. Chronic pain of right knee -  Preparing for total knee replacement in 8 days.  - HYDROcodone-Acetaminophen 2.5-325 MG TABS; Take 1 tablet by mouth every 12 (twelve) hours.  Dispense: 20 tablet; Refill: 0   Meds ordered this encounter  Medications  . HYDROcodone-Acetaminophen 2.5-325 MG TABS    Sig: Take 1 tablet by mouth every 12 (twelve) hours.    Dispense:  20 tablet    Refill:  0    Order Specific Question:   Supervising Provider    Answer:   Tresa Garter [id number]    Follow-up:  Return if symptoms worsen or fail to improve.   Clent Demark PA

## 2017-09-10 NOTE — Patient Instructions (Signed)
Preparing for Knee Replacement Getting prepared before knee replacement surgery can make your recovery easier and more comfortable. This document provides some tips and guidelines that will help you prepare for your surgery. You can ease any concerns about your financial responsibilities by calling your insurance company after you decide to have surgery. In addition to asking about your surgery and hospital stay, you will want to ask about coverage for medical equipment, rehabilitation facilities, and home care. How should I arrange for help? You will be stronger and more mobile every day. However, in the first couple of weeks after surgery, it is unlikely that you will be able to do all your daily activities as easily as you did before your surgery. You may tire easily and will still have limited movement in your leg. Follow these guidelines to best arrange for the help you may need after your surgery:  Plan to have someone take you home after the procedure. Your health care provider will tell you how many days you can expect to be in the hospital.  Cancel all work, caregiving, and volunteer responsibilities for at least 4-6 weeks after your surgery.  If you live alone, arrange for someone to take care of your home and pets for the first 4-6 weeks after surgery.  Plan to have someone stay with you day and night for the first week. This person should be someone you are comfortable with. You may need this person to help you with your exercises and personal care, such as bathing and using the toilet.  Arrange for drivers to take you to and from your follow-up appointments, the grocery store, and other places you may need to go for at least 4-6 weeks.  How should I prepare my home?  Pick a recovery spot, but do not plan on recovering in bed. Sitting upright is better for your health. You may want to use a recliner with a small table nearby. Place the items you use most frequently on that table. These  may include the TV remote, a cordless phone, a cell phone, a book or laptop computer, a water glass, and any other items of your choice.  Remove all clutter from your floors. Also remove any throw rugs.  To see if you will be able to move in your home with a walker, hold your hands out about 6 inches (15 cm) from your sides. Walk from your recovery spot to your kitchen and bathroom. Then walk from your bed to the bathroom. If you do not hit anything with your hands, you'll know that you have enough room for a walker.  Move the items that you use most often from your kitchen, bathroom, and bedroom to shelves and drawers that are at countertop height.  Prepare a few meals to freeze and reheat later.  Consider adding grab bars in the shower and near the toilet.  While you are in the hospital, you will learn about equipment that can be helpful for your recovery. Equipment may include raised toilet seats, tub benches, and shower benches. How should I prepare my body?  Have a preoperative exam. ? During the exam, your health care provider will make sure that your body is healthy enough to safely have this surgery. ? To your exam, take a complete list of all your medicines and supplements, including herbs and vitamins. ? You may need to have additional tests to ensure your safety.  Have elective dental care and routine cleanings completed before your surgery. Germs   from anywhere in your body, including your mouth, can travel to your new joint and infect it. It is important that you do not have any dental work performed for at least 3 months after your surgery. After surgery, be sure to tell your dentist about your joint replacement.  Maintain a healthy diet. Do not change your diet before surgery unless your health care provider advised you to do that.  Do not use any products that contain nicotine or tobacco, such as cigarettes or e-cigarettes. Tobacco and nicotine can delay bone healing. If you  need help quitting, ask your health care provider.  The day before your surgery, follow instructions from your health care provider for showering, eating, drinking, and taking medicines. These directions are for your safety. What kinds of exercises should I do? Your health care provider may have you do the following exercises before your surgery. Be sure to follow the exercise program only as directed by your health care provider. You should not feel pain while performing these exercises. Ankle pumps ( Dorsiflexion/plantar flexion) 1. Sit on a firm surface with your __________ leg straight out in front of you. Do not rest your foot on anything. 2. Move your foot at the ankle joint to tilt the top of your foot toward your shin. 3. Hold this position for __________ seconds. 4. Reverse the motion by tilting the top of your foot away from your shin. 5. Hold this position for __________ seconds. Repeat __________ times, then do the exercise with your other leg. Complete this exercise __________ times a day. Heel slides ( Knee flexion) 1. Lie on your back with both knees straight. (If this causes back discomfort, bend your healthy knee so your foot is flat on the floor. Keep this leg in this position while doing heel slides with the other leg.) 2. Slowly slide your __________ heel back toward your buttocks until you feel a gentle stretch in the front of your knee or thigh. 3. Hold this position for __________ seconds. 4. Slowly slide your __________ heel to the starting position. Repeat __________ times, then do the exercise with your other leg. Complete this exercise __________ times a day. Quadriceps, isometric 1. Lie on your back with your __________ leg extended and your other knee bent. 2. Gradually tighten the muscles in the front of your __________ thigh. This motion will push the back of your knee down toward the surface that is under it. 3. For __________ seconds, keep the muscles as tight  as you can without causing or increasing pain. 4. Relax the muscles slowly and completely after each repetition. Repeat __________ times, then do the exercise with your other leg. Complete this exercise __________ times a day. Short arc kicks 1. Lie on your back. Place a rolled towel (about 4-6 inches [10-15 cm] in diameter) under one knee so the knee is slightly bent. 2. Tighten the muscles in the front of your __________ thigh to raise only the lower part of your slightly bent leg off the floor. Do not allow your thigh to rise. 3. Hold this position for __________ seconds. Repeat __________ times, then do the exercise with your other leg. Complete this exercise __________ times a day. Straight leg raises - quadriceps 1. Lie on your back with your __________ leg extended and your other knee bent. 2. Tighten the muscles in the front of your __________ thigh. Your thigh may shake slightly. 3. Tighten these muscles even more to raise your leg 4-6 inches (10-15 cm)   off the floor. 4. Hold this position for __________ seconds. 5. Keep these muscles tight as you lower your leg. 6. Relax the muscles slowly and completely after each repetition. Repeat __________ times, then do the exercise with your other leg. Complete this exercise __________ times a day. Hamstring curls, prone If told by your health care provider, do this exercise while wearing ankle weights. Begin with __________ weights. Then increase the weight by 1 lb (0.5 kg) increments. Do not wear ankle weights that are more than __________. 1. Lie on your abdomen with your legs straight. 2. Bend your __________ knee as far as you can comfortably do that. Keep your hips flat against the surface that is under them. When you bend your knee, bring your foot straight toward your buttock. Do not let it fall in or out. 3. Hold this position for __________ seconds. 4. Slowly lower your leg to the starting position.  Repeat __________ times, then do  the exercise with your other leg. Complete this exercise __________ times a day. Armchair push-ups 1. Find a firm, non-wheeled chair that has solid armrests. 2. Sitting in the chair, extend your __________ leg straight out in front of you. 3. Use your arms and your other leg to lift your body weight off of the seat of the chair. 4. Slowly lower your body weight to the seat. Repeat __________ times, then do the exercise with your other leg. Complete this exercise __________ times a day. This information is not intended to replace advice given to you by your health care provider. Make sure you discuss any questions you have with your health care provider. Document Released: 10/06/2010 Document Revised: 08/04/2016 Document Reviewed: 09/24/2013 Elsevier Interactive Patient Education  2018 Elsevier Inc.  

## 2017-09-11 ENCOUNTER — Encounter (HOSPITAL_COMMUNITY): Payer: Self-pay

## 2017-09-11 ENCOUNTER — Encounter (HOSPITAL_COMMUNITY)
Admission: RE | Admit: 2017-09-11 | Discharge: 2017-09-11 | Disposition: A | Discharge status: Home / Self Care | Payer: Medicare Other | Source: Ambulatory Visit | Attending: Orthopaedic Surgery | Admitting: Orthopaedic Surgery

## 2017-09-11 ENCOUNTER — Other Ambulatory Visit: Payer: Self-pay

## 2017-09-11 DIAGNOSIS — M1711 Unilateral primary osteoarthritis, right knee: Secondary | ICD-10-CM | POA: Insufficient documentation

## 2017-09-11 DIAGNOSIS — Z01812 Encounter for preprocedural laboratory examination: Secondary | ICD-10-CM | POA: Insufficient documentation

## 2017-09-11 HISTORY — DX: Headache: R51

## 2017-09-11 HISTORY — DX: Headache, unspecified: R51.9

## 2017-09-11 LAB — COMPREHENSIVE METABOLIC PANEL
ALT: 17 IU/L (ref 0–32)
ALT: 19 U/L (ref 14–54)
AST: 16 IU/L (ref 0–40)
AST: 20 U/L (ref 15–41)
Albumin/Globulin Ratio: 1.5 (ref 1.2–2.2)
Albumin: 4.4 g/dL (ref 3.6–4.8)
Albumin: 3.8 g/dL (ref 3.5–5.0)
Alkaline Phosphatase: 85 IU/L (ref 39–117)
Alkaline Phosphatase: 70 U/L (ref 38–126)
Anion gap: 11 (ref 5–15)
BUN/Creatinine Ratio: 14 (ref 12–28)
BUN: 18 mg/dL (ref 8–27)
BUN: 18 mg/dL (ref 6–20)
Bilirubin Total: <0.2 mg/dL (ref 0.0–1.2)
CO2: 27 mmol/L (ref 20–29)
CO2: 24 mmol/L (ref 22–32)
Calcium: 9.2 mg/dL (ref 8.7–10.3)
Calcium: 9.3 mg/dL (ref 8.9–10.3)
Chloride: 99 mmol/L (ref 96–106)
Chloride: 103 mmol/L (ref 101–111)
Creatinine, Ser: 1.28 mg/dL — ABNORMAL HIGH (ref 0.57–1.00)
Creatinine, Ser: 1.01 mg/dL — ABNORMAL HIGH (ref 0.44–1.00)
GFR calc Af Amer: 49 mL/min/{1.73_m2} — ABNORMAL LOW (ref >59)
GFR calc Af Amer: >60 mL/min (ref >60)
GFR calc non Af Amer: 43 mL/min/{1.73_m2} — ABNORMAL LOW (ref >59)
GFR calc non Af Amer: 55 mL/min — ABNORMAL LOW (ref >60)
Globulin, Total: 2.9 g/dL (ref 1.5–4.5)
Glucose, Bld: 125 mg/dL — ABNORMAL HIGH (ref 65–99)
Glucose: 104 mg/dL — ABNORMAL HIGH (ref 65–99)
Potassium: 3.1 mmol/L — ABNORMAL LOW (ref 3.5–5.2)
Potassium: 3 mmol/L — ABNORMAL LOW (ref 3.5–5.1)
Sodium: 140 mmol/L (ref 134–144)
Sodium: 138 mmol/L (ref 135–145)
Total Bilirubin: 0.7 mg/dL (ref 0.3–1.2)
Total Protein: 7.3 g/dL (ref 6.0–8.5)
Total Protein: 7.1 g/dL (ref 6.5–8.1)

## 2017-09-11 LAB — CBC WITH DIFFERENTIAL/PLATELET
Basophils Absolute: 0 10*3/uL (ref 0.0–0.2)
Basophils Absolute: 0 10*3/uL (ref 0.0–0.1)
Basophils Relative: 0 %
Basos: 0 %
EOS (ABSOLUTE): 0.1 10*3/uL (ref 0.0–0.4)
Eos: 1 %
Eosinophils Absolute: 0.1 10*3/uL (ref 0.0–0.7)
Eosinophils Relative: 1 %
HCT: 35.6 % — ABNORMAL LOW (ref 36.0–46.0)
Hematocrit: 36.1 % (ref 34.0–46.6)
Hemoglobin: 11.5 g/dL (ref 11.1–15.9)
Hemoglobin: 12 g/dL (ref 12.0–15.0)
Immature Grans (Abs): 0 10*3/uL (ref 0.0–0.1)
Immature Granulocytes: 0 %
Lymphocytes Absolute: 3.3 10*3/uL — ABNORMAL HIGH (ref 0.7–3.1)
Lymphocytes Relative: 30 %
Lymphs Abs: 2.4 10*3/uL (ref 0.7–4.0)
Lymphs: 43 %
MCH: 26.4 pg — ABNORMAL LOW (ref 26.6–33.0)
MCH: 27.8 pg (ref 26.0–34.0)
MCHC: 31.9 g/dL (ref 31.5–35.7)
MCHC: 33.7 g/dL (ref 30.0–36.0)
MCV: 83 fL (ref 79–97)
MCV: 82.4 fL (ref 78.0–100.0)
Monocytes Absolute: 0.6 10*3/uL (ref 0.1–0.9)
Monocytes Absolute: 0.5 10*3/uL (ref 0.1–1.0)
Monocytes Relative: 6 %
Monocytes: 8 %
Neutro Abs: 5.2 10*3/uL (ref 1.7–7.7)
Neutrophils Absolute: 3.7 10*3/uL (ref 1.4–7.0)
Neutrophils Relative %: 63 %
Neutrophils: 48 %
Platelets: 211 10*3/uL (ref 150–379)
Platelets: 203 10*3/uL (ref 150–400)
RBC: 4.35 x10E6/uL (ref 3.77–5.28)
RBC: 4.32 MIL/uL (ref 3.87–5.11)
RDW: 15.4 % (ref 12.3–15.4)
RDW: 14.6 % (ref 11.5–15.5)
WBC: 7.7 10*3/uL (ref 3.4–10.8)
WBC: 8.2 10*3/uL (ref 4.0–10.5)

## 2017-09-11 LAB — PT AND PTT
INR: 1 (ref 0.8–1.2)
Prothrombin Time: 10.4 s (ref 9.1–12.0)
aPTT: 26 s (ref 24–33)

## 2017-09-11 LAB — TYPE AND SCREEN
ABO/RH(D): B POS
Antibody Screen: NEGATIVE

## 2017-09-11 LAB — SURGICAL PCR SCREEN
MRSA, PCR: NEGATIVE
Staphylococcus aureus: POSITIVE — AB

## 2017-09-11 LAB — PROTIME-INR
INR: 1.06
Prothrombin Time: 13.7 seconds (ref 11.4–15.2)

## 2017-09-11 LAB — ABO AND RH: Rh Factor: POSITIVE

## 2017-09-11 LAB — APTT: aPTT: 26 seconds (ref 24–36)

## 2017-09-11 LAB — ABO/RH: ABO/RH(D): B POS

## 2017-09-11 NOTE — Pre-Procedure Instructions (Signed)
Rachel Crane  09/11/2017      [facility] [address], [address] [place] [address] Phone: [phone] Fax: [phone]    Your procedure is scheduled on March 7  Report to [facility] Admitting at 1250 P.M.  Call this number if you have problems the morning of surgery:  [phone]   Remember:  Do not eat food or drink liquids after midnight.  Continue all medications as directed by your physician except follow these medication instructions before surgery below   Take these medicines the morning of surgery with A SIP OF WATER  azelastine (ASTELIN), also may take pain medicine the morning of your surgery if needed  7 days prior to surgery STOP taking any Aspirin(unless otherwise instructed by your surgeon), Aleve, Naproxen, Ibuprofen, Motrin, Advil, Goody's, BC's, all herbal medications, fish oil, and all vitamins    Do not wear jewelry, make-up or nail polish.   Do not wear lotions, powders, or perfumes, or deodorant.   Do not shave 48 hours prior to surgery.     Do not bring valuables to the hospital.   [facility] is not responsible for any belongings or valuables.  Contacts, dentures or bridgework may not be worn into surgery.  Leave your suitcase in the car.  After surgery it may be brought to your room.  For patients admitted to the hospital, discharge time will be determined by your treatment team.  Patients discharged the day of surgery will not be allowed to drive home.    Special instructions:   [name]- Preparing For Surgery  Before surgery, you can play an important role. Because skin is not sterile, your skin needs to be as free of germs as possible. You can reduce the number of germs on your skin by washing with CHG (chlorahexidine gluconate) Soap before surgery.  CHG is an antiseptic cleaner which kills germs and bonds with the skin to continue  killing germs even after washing.  Please do not use if you have an allergy to CHG or antibacterial soaps. If your skin becomes reddened/irritated stop using the CHG.  Do not shave (including legs and underarms) for at least 48 hours prior to first CHG shower. It is OK to shave your face.  Please follow these instructions carefully.   1. Shower the NIGHT BEFORE SURGERY and the MORNING OF SURGERY with CHG.   2. If you chose to wash your hair, wash your hair first as usual with your normal shampoo.  3. After you shampoo, rinse your hair and body thoroughly to remove the shampoo.  4. Use CHG as you would any other liquid soap. You can apply CHG directly to the skin and wash gently with a scrungie or a clean washcloth.   5. Apply the CHG Soap to your body ONLY FROM THE NECK DOWN.  Do not use on open wounds or open sores. Avoid contact with your eyes, ears, mouth and genitals (private parts). Wash Face and genitals (private parts)  with your normal soap.  6. Wash thoroughly, paying special attention to the area where your surgery will be performed.  7. Thoroughly rinse your body with warm water from the neck down.  8. DO NOT shower/wash with your normal soap after using and rinsing off the CHG Soap.  9. Pat yourself dry with a CLEAN TOWEL.  10. Wear CLEAN PAJAMAS to bed  the night before surgery, wear comfortable clothes the morning of surgery  11. Place CLEAN SHEETS on your bed the night of your first shower and DO NOT SLEEP WITH PETS.    Day of Surgery: Do not apply any deodorants/lotions. Please wear clean clothes to the hospital/surgery center.      Please read over the following fact sheets that you were given.

## 2017-09-11 NOTE — Progress Notes (Signed)
Pt. Reports no chest pain, or breathing problems. Pt. Reports multiple pain areas related to joints. Pt. States that she wants a review of her cardiac status a yr. ago  So she was referred for echo with Dr. Irven Shelling office.  Pt. Followed by PA with [facility] , [facility] Snuffer. Hydrocodone recently started that she doesn't feel safe driving, at 2.5 mg. She is feeling "foggy". Labs done through PA office, but T&S needs to be drawn today.

## 2017-09-12 ENCOUNTER — Other Ambulatory Visit (INDEPENDENT_AMBULATORY_CARE_PROVIDER_SITE_OTHER): Payer: Self-pay | Admitting: Physician Assistant

## 2017-09-12 ENCOUNTER — Telehealth (INDEPENDENT_AMBULATORY_CARE_PROVIDER_SITE_OTHER): Payer: Self-pay | Admitting: Physician Assistant

## 2017-09-12 ENCOUNTER — Telehealth (INDEPENDENT_AMBULATORY_CARE_PROVIDER_SITE_OTHER): Payer: Self-pay

## 2017-09-12 ENCOUNTER — Encounter (INDEPENDENT_AMBULATORY_CARE_PROVIDER_SITE_OTHER): Payer: Self-pay | Admitting: Physician Assistant

## 2017-09-12 ENCOUNTER — Telehealth (INDEPENDENT_AMBULATORY_CARE_PROVIDER_SITE_OTHER): Payer: Self-pay | Admitting: Orthopaedic Surgery

## 2017-09-12 DIAGNOSIS — E876 Hypokalemia: Secondary | ICD-10-CM

## 2017-09-12 MED ORDER — POTASSIUM CHLORIDE ER 10 MEQ PO TBCR: 10.0000 meq | EXTENDED_RELEASE_TABLET | Freq: Two times a day (BID) | ORAL | 0 refills | Status: DC

## 2017-09-12 MED ORDER — POTASSIUM CHLORIDE CRYS ER 10 MEQ PO TBCR: 40.0000 meq | EXTENDED_RELEASE_TABLET | Freq: Two times a day (BID) | ORAL | 0 refills | Status: DC

## 2017-09-12 NOTE — Telephone Encounter (Signed)
Pending.waiting on Response from MD. They are in [place] this PM.

## 2017-09-12 NOTE — Telephone Encounter (Signed)
Yes that would be great. Please take polish off.  Helps decrease risk of infection.  Also trim toenails too.

## 2017-09-12 NOTE — Telephone Encounter (Signed)
Talked to patient about the potassium supplement that was sent into the pharmacy, but she wanted to know if she needed to take her toe nail polish off before surgery next week? CB #  [phone]

## 2017-09-12 NOTE — Telephone Encounter (Signed)
Please advise 

## 2017-09-12 NOTE — Telephone Encounter (Signed)
Rx sent for 10 meq BID x14 days.

## 2017-09-12 NOTE — Telephone Encounter (Signed)
Called Patient no answer. LMOM  With details below.

## 2017-09-12 NOTE — Telephone Encounter (Signed)
Called Ulice Dash back from [facility] advised him to change quantity to 32 instead. Okay per Mendel Ryder.

## 2017-09-12 NOTE — Progress Notes (Addendum)
Anesthesia Chart Review:  Pt is a 72 year old female scheduled for R total knee arthroplasty on 09/19/2017 with Frankey Shown, MD  - PCP is Clent Demark, PA - Saw cardiologist Kela Millin, MD in 2018 for SOB. Echo and stress test ordered, results below. Last office visit 02/28/17; prn f/u recommended.    PMH includes:  HTN, OSA. Never smoker. BMI 33  Medications include: Chlorthalidone, losartan  BP 127/67   Pulse 72   Temp 36.8 C   Resp 18   Ht 5\' 6"  (1.676 m)   Wt 205 lb 9.6 oz (93.3 kg)   SpO2 98%   BMI 33.18 kg/m   Preoperative labs reviewed.   - K is 3.0. Pt prescribed KCl by Dwana Melena, PA for Dr. Erlinda Hong.   CXR 05/13/17: No active cardiopulmonary disease.  EKG 05/21/17: Sinus  Bradycardia (57 bpm). Negative precordial T-waves  -Probably normal -consider anteroseptal ischemia.   Echo 01/17/17:  1.  LV cavity normal in size.  Moderate concentric LVH.  Normal global wall motion.  Doppler evidence of grade II diastolic dysfunction.  Diastolic dysfunction findings suggest elevated LA/LV end-diastolic pressure.  Calculated EF 68%. 2.  Trace mitral regurgitation. 3.  Trace tricuspid regurgitation.  Unable to estimate RA pressure due to absent/minimal TR signal  Carotid duplex 01/09/17 [facility] cardiovascular): 1.  There is mild soft plaque in bilateral carotid arteries without stenosis. 2.  Antegrade B vertebral artery flow  Nuclear stress test 12/31/16:  1. Resting EKG shows NSR, normal resting conduction, no resting arrhythmias, and normal rest repolarization.  Stress EKG nondiagnostic as it is a pharmacologic stress test.  Stress symptoms included dyspnea. 2.  Myocardial perfusion imaging is normal.  Overall LV systolic function normal without regional wall motion abnormalities.  LVEF 75%.  If no changes, I anticipate pt can proceed with surgery as scheduled.   Willeen Cass, FNP-BC [facility] Short Stay Surgical Center/Anesthesiology Phone: [phone] 09/13/2017 12:30  PM

## 2017-09-12 NOTE — Telephone Encounter (Signed)
It is supposed to be 32meq and to take twice a day for 4 days.  Should be 8 pills.  Her potassium is very low and she is scheduled for surgery.  We are trying to get potassium up for surgery

## 2017-09-12 NOTE — Telephone Encounter (Signed)
Ulice Dash from [place] called and would like to clarify to see if quantity is incorrect or instructions?   Please correct Rx  [place] like Mendel Ryder did Rx.

## 2017-09-12 NOTE — Telephone Encounter (Signed)
Patients pharmacy is requesting potassium script to address 3.0 potassium level.

## 2017-09-12 NOTE — Progress Notes (Signed)
Sent in kdur earlier today

## 2017-09-12 NOTE — Telephone Encounter (Signed)
Pt called since she was prescribe a prescription for potassium chloride (K-DUR,KLOR-CON) 10 MEQ tablet  Since her potassium is very low, the pharmacy try to contact the prescribe pcp since this morning, but no luck, she need help please call her back,

## 2017-09-12 NOTE — Telephone Encounter (Signed)
Was RX corrected? Patient was at pharmacy and the pharmacist said the instructions were unclear due to the amount of the quantity given. Please advise pharmacy

## 2017-09-13 NOTE — Telephone Encounter (Signed)
Medical Assistant left message on patient's home and cell voicemail. Voicemail states to give a call back to [place] with [facility] at [phone]. Patient is aware of supplement being sent and needing to pick it up.

## 2017-09-13 NOTE — Telephone Encounter (Signed)
-----   Message from Clent Demark, PA-C sent at 09/13/2017  1:58 PM EST ----- I have sent potassium for patient. Please verify she has filled prescription.

## 2017-09-16 NOTE — Telephone Encounter (Signed)
Called patient to advise no answer. LMOM.    

## 2017-09-18 MED ORDER — TRANEXAMIC ACID 1000 MG/10ML IV SOLN
2000.0000 mg | INTRAVENOUS | Status: AC
  Administered 2017-09-19: 2000 mg via TOPICAL
  Filled 2017-09-18: qty 20

## 2017-09-18 MED ORDER — CEFAZOLIN SODIUM-DEXTROSE 2-4 GM/100ML-% IV SOLN
2.0000 g | INTRAVENOUS | Status: AC
  Administered 2017-09-19: 2 g via INTRAVENOUS
  Filled 2017-09-18: qty 100

## 2017-09-18 MED ORDER — LACTATED RINGERS IV SOLN: INTRAVENOUS | Status: DC

## 2017-09-18 MED ORDER — TRANEXAMIC ACID 1000 MG/10ML IV SOLN
1000.0000 mg | INTRAVENOUS | Status: AC
  Administered 2017-09-19: 1000 mg via INTRAVENOUS
  Filled 2017-09-18: qty 1100

## 2017-09-19 ENCOUNTER — Ambulatory Visit (HOSPITAL_COMMUNITY): Payer: Medicare Other | Admitting: Emergency Medicine

## 2017-09-19 ENCOUNTER — Inpatient Hospital Stay (HOSPITAL_COMMUNITY)
Admission: RE | Admit: 2017-09-19 | Discharge: 2017-09-23 | DRG: 470 | Disposition: A | Discharge status: Skilled Nursing Facility | Payer: Medicare Other | Source: Ambulatory Visit | Attending: Orthopaedic Surgery | Admitting: Orthopaedic Surgery

## 2017-09-19 ENCOUNTER — Inpatient Hospital Stay (HOSPITAL_COMMUNITY): Payer: Medicare Other

## 2017-09-19 ENCOUNTER — Encounter (HOSPITAL_COMMUNITY): Payer: Self-pay | Admitting: Certified Registered Nurse Anesthetist

## 2017-09-19 ENCOUNTER — Encounter (HOSPITAL_COMMUNITY)
Admission: RE | Disposition: A | Discharge status: Skilled Nursing Facility | Payer: Self-pay | Source: Ambulatory Visit | Attending: Orthopaedic Surgery

## 2017-09-19 ENCOUNTER — Other Ambulatory Visit (INDEPENDENT_AMBULATORY_CARE_PROVIDER_SITE_OTHER): Payer: Self-pay | Admitting: Orthopaedic Surgery

## 2017-09-19 ENCOUNTER — Ambulatory Visit (HOSPITAL_COMMUNITY): Payer: Medicare Other | Admitting: Certified Registered Nurse Anesthetist

## 2017-09-19 DIAGNOSIS — G8918 Other acute postprocedural pain: Secondary | ICD-10-CM | POA: Diagnosis not present

## 2017-09-19 DIAGNOSIS — G4730 Sleep apnea, unspecified: Secondary | ICD-10-CM | POA: Diagnosis not present

## 2017-09-19 DIAGNOSIS — Z79899 Other long term (current) drug therapy: Secondary | ICD-10-CM

## 2017-09-19 DIAGNOSIS — I10 Essential (primary) hypertension: Secondary | ICD-10-CM | POA: Diagnosis not present

## 2017-09-19 DIAGNOSIS — G4733 Obstructive sleep apnea (adult) (pediatric): Secondary | ICD-10-CM | POA: Diagnosis present

## 2017-09-19 DIAGNOSIS — M1711 Unilateral primary osteoarthritis, right knee: Secondary | ICD-10-CM | POA: Diagnosis not present

## 2017-09-19 DIAGNOSIS — R488 Other symbolic dysfunctions: Secondary | ICD-10-CM | POA: Diagnosis not present

## 2017-09-19 DIAGNOSIS — M6281 Muscle weakness (generalized): Secondary | ICD-10-CM | POA: Diagnosis not present

## 2017-09-19 DIAGNOSIS — D62 Acute posthemorrhagic anemia: Secondary | ICD-10-CM | POA: Diagnosis not present

## 2017-09-19 DIAGNOSIS — S8002XA Contusion of left knee, initial encounter: Secondary | ICD-10-CM | POA: Diagnosis not present

## 2017-09-19 DIAGNOSIS — R41841 Cognitive communication deficit: Secondary | ICD-10-CM | POA: Diagnosis not present

## 2017-09-19 DIAGNOSIS — G8911 Acute pain due to trauma: Secondary | ICD-10-CM | POA: Diagnosis not present

## 2017-09-19 DIAGNOSIS — Z96651 Presence of right artificial knee joint: Secondary | ICD-10-CM | POA: Diagnosis not present

## 2017-09-19 DIAGNOSIS — Z96659 Presence of unspecified artificial knee joint: Secondary | ICD-10-CM

## 2017-09-19 DIAGNOSIS — Z471 Aftercare following joint replacement surgery: Secondary | ICD-10-CM | POA: Diagnosis not present

## 2017-09-19 DIAGNOSIS — R2689 Other abnormalities of gait and mobility: Secondary | ICD-10-CM | POA: Diagnosis not present

## 2017-09-19 DIAGNOSIS — M25561 Pain in right knee: Secondary | ICD-10-CM | POA: Diagnosis not present

## 2017-09-19 HISTORY — PX: TOTAL KNEE ARTHROPLASTY: SHX125

## 2017-09-19 SURGERY — ARTHROPLASTY, KNEE, TOTAL
Anesthesia: Monitor Anesthesia Care | Site: Knee | Laterality: Right

## 2017-09-19 MED ORDER — ONDANSETRON HCL 4 MG/2ML IJ SOLN
INTRAMUSCULAR | Status: DC | PRN
  Administered 2017-09-19: 4 mg via INTRAVENOUS

## 2017-09-19 MED ORDER — TIZANIDINE HCL 4 MG PO TABS: 4.0000 mg | ORAL_TABLET | Freq: Four times a day (QID) | ORAL | 2 refills | Status: DC | PRN

## 2017-09-19 MED ORDER — CHLORHEXIDINE GLUCONATE 4 % EX LIQD: 60.0000 mL | Freq: Once | CUTANEOUS | Status: DC

## 2017-09-19 MED ORDER — SODIUM CHLORIDE 0.9 % IR SOLN
Status: DC | PRN
  Administered 2017-09-19: 3000 mL

## 2017-09-19 MED ORDER — VANCOMYCIN HCL POWD
Status: DC | PRN
  Administered 2017-09-19: 1000 mg via TOPICAL

## 2017-09-19 MED ORDER — ALUM & MAG HYDROXIDE-SIMETH 200-200-20 MG/5ML PO SUSP: 30.0000 mL | ORAL | Status: DC | PRN

## 2017-09-19 MED ORDER — ASPIRIN EC 325 MG PO TBEC
325.0000 mg | DELAYED_RELEASE_TABLET | Freq: Two times a day (BID) | ORAL | Status: DC
  Administered 2017-09-20 – 2017-09-22 (×5): 325 mg via ORAL
  Filled 2017-09-19 (×5): qty 1

## 2017-09-19 MED ORDER — SORBITOL 70 % SOLN: 30.0000 mL | Freq: Every day | Status: DC | PRN

## 2017-09-19 MED ORDER — POTASSIUM CHLORIDE CRYS ER 10 MEQ PO TBCR
10.0000 meq | EXTENDED_RELEASE_TABLET | Freq: Two times a day (BID) | ORAL | Status: DC
  Administered 2017-09-19 – 2017-09-23 (×8): 10 meq via ORAL
  Filled 2017-09-19 (×10): qty 1

## 2017-09-19 MED ORDER — ACETAMINOPHEN 325 MG PO TABS
325.0000 mg | ORAL_TABLET | Freq: Four times a day (QID) | ORAL | Status: DC | PRN
  Administered 2017-09-19: 650 mg via ORAL
  Filled 2017-09-19: qty 2

## 2017-09-19 MED ORDER — PHENYLEPHRINE HCL 10 MG/ML IJ SOLN
INTRAMUSCULAR | Status: AC
  Filled 2017-09-19: qty 1

## 2017-09-19 MED ORDER — PROPOFOL 10 MG/ML IV BOLUS
INTRAVENOUS | Status: DC | PRN
  Administered 2017-09-19: 10 mg via INTRAVENOUS
  Administered 2017-09-19: 20 mg via INTRAVENOUS

## 2017-09-19 MED ORDER — METOCLOPRAMIDE HCL 5 MG PO TABS: 5.0000 mg | ORAL_TABLET | Freq: Three times a day (TID) | ORAL | Status: DC | PRN

## 2017-09-19 MED ORDER — VANCOMYCIN HCL 1000 MG IV SOLR
INTRAVENOUS | Status: AC
  Filled 2017-09-19: qty 1000

## 2017-09-19 MED ORDER — KETOROLAC TROMETHAMINE 15 MG/ML IJ SOLN
15.0000 mg | Freq: Four times a day (QID) | INTRAMUSCULAR | Status: AC
  Administered 2017-09-19 – 2017-09-20 (×4): 15 mg via INTRAVENOUS
  Filled 2017-09-19 (×4): qty 1

## 2017-09-19 MED ORDER — ROPIVACAINE HCL 5 MG/ML IJ SOLN
INTRAMUSCULAR | Status: DC | PRN
  Administered 2017-09-19: 30 mL via PERINEURAL

## 2017-09-19 MED ORDER — SENNOSIDES-DOCUSATE SODIUM 8.6-50 MG PO TABS: 1.0000 | ORAL_TABLET | Freq: Every evening | ORAL | 1 refills | Status: DC | PRN

## 2017-09-19 MED ORDER — PHENOL 1.4 % MT LIQD: 1.0000 | OROMUCOSAL | Status: DC | PRN

## 2017-09-19 MED ORDER — SODIUM CHLORIDE 0.9 % IV SOLN
INTRAVENOUS | Status: DC
  Administered 2017-09-19: 16:00:00 via INTRAVENOUS

## 2017-09-19 MED ORDER — BUPIVACAINE LIPOSOME 1.3 % IJ SUSP
20.0000 mL | Freq: Once | INTRAMUSCULAR | Status: AC
  Filled 2017-09-19 (×2): qty 20

## 2017-09-19 MED ORDER — POLYETHYLENE GLYCOL 3350 17 G PO PACK: 17.0000 g | PACK | Freq: Every day | ORAL | Status: DC | PRN

## 2017-09-19 MED ORDER — DEXTROSE 5 % IV SOLN
500.0000 mg | Freq: Four times a day (QID) | INTRAVENOUS | Status: DC | PRN
  Filled 2017-09-19: qty 5

## 2017-09-19 MED ORDER — AZELASTINE HCL 0.1 % NA SOLN
2.0000 | Freq: Two times a day (BID) | NASAL | Status: DC | PRN
  Filled 2017-09-19: qty 30

## 2017-09-19 MED ORDER — PHENYLEPHRINE 40 MCG/ML (10ML) SYRINGE FOR IV PUSH (FOR BLOOD PRESSURE SUPPORT)
PREFILLED_SYRINGE | INTRAVENOUS | Status: DC | PRN
  Administered 2017-09-19: 80 ug via INTRAVENOUS
  Administered 2017-09-19 (×2): 40 ug via INTRAVENOUS

## 2017-09-19 MED ORDER — ONDANSETRON HCL 4 MG PO TABS: 4.0000 mg | ORAL_TABLET | Freq: Three times a day (TID) | ORAL | 0 refills | Status: DC | PRN

## 2017-09-19 MED ORDER — DIPHENHYDRAMINE HCL 12.5 MG/5ML PO ELIX: 25.0000 mg | ORAL_SOLUTION | ORAL | Status: DC | PRN

## 2017-09-19 MED ORDER — LOSARTAN POTASSIUM 50 MG PO TABS
100.0000 mg | ORAL_TABLET | Freq: Every day | ORAL | Status: DC
  Administered 2017-09-20 – 2017-09-23 (×4): 100 mg via ORAL
  Filled 2017-09-19 (×4): qty 2

## 2017-09-19 MED ORDER — BUPIVACAINE LIPOSOME 1.3 % IJ SUSP
INTRAMUSCULAR | Status: DC | PRN
  Administered 2017-09-19: 20 mL

## 2017-09-19 MED ORDER — MAGNESIUM CITRATE PO SOLN: 1.0000 | Freq: Once | ORAL | Status: DC | PRN

## 2017-09-19 MED ORDER — FENTANYL CITRATE (PF) 100 MCG/2ML IJ SOLN
INTRAMUSCULAR | Status: AC
  Administered 2017-09-19: 50 ug
  Filled 2017-09-19: qty 2

## 2017-09-19 MED ORDER — DEXAMETHASONE SODIUM PHOSPHATE 10 MG/ML IJ SOLN
10.0000 mg | Freq: Once | INTRAMUSCULAR | Status: AC
  Administered 2017-09-20: 10 mg via INTRAVENOUS
  Filled 2017-09-19: qty 1

## 2017-09-19 MED ORDER — CEFAZOLIN SODIUM-DEXTROSE 2-4 GM/100ML-% IV SOLN
2.0000 g | Freq: Four times a day (QID) | INTRAVENOUS | Status: AC
  Administered 2017-09-19 – 2017-09-20 (×3): 2 g via INTRAVENOUS
  Filled 2017-09-19 (×3): qty 100

## 2017-09-19 MED ORDER — MENTHOL 3 MG MT LOZG: 1.0000 | LOZENGE | OROMUCOSAL | Status: DC | PRN

## 2017-09-19 MED ORDER — ONDANSETRON HCL 4 MG/2ML IJ SOLN
INTRAMUSCULAR | Status: AC
  Filled 2017-09-19: qty 2

## 2017-09-19 MED ORDER — PROPOFOL 10 MG/ML IV BOLUS
INTRAVENOUS | Status: AC
  Filled 2017-09-19: qty 20

## 2017-09-19 MED ORDER — CHLORTHALIDONE 25 MG PO TABS
12.5000 mg | ORAL_TABLET | Freq: Every day | ORAL | Status: DC
  Administered 2017-09-21 – 2017-09-23 (×3): 12.5 mg via ORAL
  Filled 2017-09-19 (×5): qty 0.5

## 2017-09-19 MED ORDER — ONDANSETRON HCL 4 MG/2ML IJ SOLN
4.0000 mg | Freq: Four times a day (QID) | INTRAMUSCULAR | Status: DC | PRN
  Administered 2017-09-19 – 2017-09-21 (×3): 4 mg via INTRAVENOUS
  Filled 2017-09-19 (×3): qty 2

## 2017-09-19 MED ORDER — OXYCODONE HCL ER 10 MG PO T12A
10.0000 mg | EXTENDED_RELEASE_TABLET | Freq: Two times a day (BID) | ORAL | Status: DC
  Administered 2017-09-19 – 2017-09-22 (×7): 10 mg via ORAL
  Filled 2017-09-19 (×7): qty 1

## 2017-09-19 MED ORDER — 0.9 % SODIUM CHLORIDE (POUR BTL) OPTIME
TOPICAL | Status: DC | PRN
  Administered 2017-09-19: 1000 mL

## 2017-09-19 MED ORDER — HYDROMORPHONE HCL 1 MG/ML IJ SOLN
0.5000 mg | INTRAMUSCULAR | Status: DC | PRN
  Administered 2017-09-19 – 2017-09-21 (×3): 1 mg via INTRAVENOUS
  Filled 2017-09-19 (×3): qty 1

## 2017-09-19 MED ORDER — OXYCODONE HCL ER 10 MG PO T12A: 10.0000 mg | EXTENDED_RELEASE_TABLET | Freq: Two times a day (BID) | ORAL | 0 refills | Status: DC

## 2017-09-19 MED ORDER — PHENYLEPHRINE HCL 10 MG/ML IJ SOLN
INTRAVENOUS | Status: DC | PRN
  Administered 2017-09-19: 20 ug/min via INTRAVENOUS

## 2017-09-19 MED ORDER — OXYCODONE HCL 5 MG PO TABS
5.0000 mg | ORAL_TABLET | ORAL | Status: DC | PRN
  Administered 2017-09-20 – 2017-09-23 (×5): 10 mg via ORAL
  Filled 2017-09-19 (×5): qty 2

## 2017-09-19 MED ORDER — LIDOCAINE 2% (20 MG/ML) 5 ML SYRINGE
INTRAMUSCULAR | Status: DC | PRN
  Administered 2017-09-19: 50 mg via INTRAVENOUS

## 2017-09-19 MED ORDER — OXYCODONE HCL 5 MG PO TABS
10.0000 mg | ORAL_TABLET | ORAL | Status: DC | PRN
  Administered 2017-09-19: 10 mg via ORAL
  Administered 2017-09-21 – 2017-09-22 (×3): 15 mg via ORAL
  Administered 2017-09-23: 10 mg via ORAL
  Filled 2017-09-19 (×3): qty 3
  Filled 2017-09-19: qty 2
  Filled 2017-09-19 (×2): qty 3

## 2017-09-19 MED ORDER — OXYCODONE HCL 5 MG PO TABS: 5.0000 mg | ORAL_TABLET | ORAL | 0 refills | Status: DC | PRN

## 2017-09-19 MED ORDER — DOCUSATE SODIUM 100 MG PO CAPS
100.0000 mg | ORAL_CAPSULE | Freq: Two times a day (BID) | ORAL | Status: DC
  Administered 2017-09-19 – 2017-09-23 (×8): 100 mg via ORAL
  Filled 2017-09-19 (×8): qty 1

## 2017-09-19 MED ORDER — PROMETHAZINE HCL 25 MG PO TABS: 25.0000 mg | ORAL_TABLET | Freq: Four times a day (QID) | ORAL | 1 refills | Status: DC | PRN

## 2017-09-19 MED ORDER — LACTATED RINGERS IV SOLN
INTRAVENOUS | Status: DC
  Administered 2017-09-19 (×2): via INTRAVENOUS

## 2017-09-19 MED ORDER — PROPOFOL 500 MG/50ML IV EMUL
INTRAVENOUS | Status: DC | PRN
  Administered 2017-09-19: 75 ug/kg/min via INTRAVENOUS

## 2017-09-19 MED ORDER — METOCLOPRAMIDE HCL 5 MG/ML IJ SOLN
5.0000 mg | Freq: Three times a day (TID) | INTRAMUSCULAR | Status: DC | PRN
  Administered 2017-09-20: 10 mg via INTRAVENOUS
  Filled 2017-09-19: qty 2

## 2017-09-19 MED ORDER — ONDANSETRON HCL 4 MG PO TABS
4.0000 mg | ORAL_TABLET | Freq: Four times a day (QID) | ORAL | Status: DC | PRN
  Administered 2017-09-23: 4 mg via ORAL
  Filled 2017-09-19 (×2): qty 1

## 2017-09-19 MED ORDER — ASPIRIN EC 325 MG PO TBEC: 325.0000 mg | DELAYED_RELEASE_TABLET | Freq: Two times a day (BID) | ORAL | 0 refills | Status: DC

## 2017-09-19 MED ORDER — METHOCARBAMOL 500 MG PO TABS
500.0000 mg | ORAL_TABLET | Freq: Four times a day (QID) | ORAL | Status: DC | PRN
  Administered 2017-09-19 – 2017-09-22 (×7): 500 mg via ORAL
  Filled 2017-09-19 (×7): qty 1

## 2017-09-19 MED ORDER — SODIUM CHLORIDE 0.9% FLUSH
INTRAVENOUS | Status: DC | PRN
  Administered 2017-09-19: 40 mL

## 2017-09-19 MED ORDER — TRANEXAMIC ACID 1000 MG/10ML IV SOLN
1000.0000 mg | Freq: Once | INTRAVENOUS | Status: AC
  Administered 2017-09-19: 1000 mg via INTRAVENOUS
  Filled 2017-09-19: qty 10

## 2017-09-19 MED ORDER — MIDAZOLAM HCL 2 MG/2ML IJ SOLN
INTRAMUSCULAR | Status: AC
  Filled 2017-09-19: qty 2

## 2017-09-19 MED ORDER — FENTANYL CITRATE (PF) 100 MCG/2ML IJ SOLN: 50.0000 ug | Freq: Once | INTRAMUSCULAR | Status: AC

## 2017-09-19 SURGICAL SUPPLY — 68 items
ALCOHOL ISOPROPYL (RUBBING) (MISCELLANEOUS) ×2 IMPLANT
BAG DECANTER FOR FLEXI CONT (MISCELLANEOUS) ×2 IMPLANT
BANDAGE ESMARK 6X9 LF (GAUZE/BANDAGES/DRESSINGS) ×1 IMPLANT
BENZOIN TINCTURE PRP APPL 2/3 (GAUZE/BANDAGES/DRESSINGS) ×2 IMPLANT
BLADE SAW SGTL 13.0X1.19X90.0M (BLADE) ×2 IMPLANT
BNDG ELASTIC 6X10 VLCR STRL LF (GAUZE/BANDAGES/DRESSINGS) ×2 IMPLANT
BNDG ESMARK 6X9 LF (GAUZE/BANDAGES/DRESSINGS) ×2
BOWL SMART MIX CTS (DISPOSABLE) ×2 IMPLANT
CEMENT BONE REFOBACIN R1X40 US (Cement) ×4 IMPLANT
CLSR STERI-STRIP ANTIMIC 1/2X4 (GAUZE/BANDAGES/DRESSINGS) ×2 IMPLANT
COMPONENT TIBIA RIGHT SZ 4 (Knees) ×1 IMPLANT
COVER SURGICAL LIGHT HANDLE (MISCELLANEOUS) ×2 IMPLANT
CUFF TOURNIQUET SINGLE 34IN LL (TOURNIQUET CUFF) ×2 IMPLANT
CUFF TOURNIQUET SINGLE 44IN (TOURNIQUET CUFF) IMPLANT
DRAPE EXTREMITY T 121X128X90 (DRAPE) ×2 IMPLANT
DRAPE HALF SHEET 40X57 (DRAPES) ×2 IMPLANT
DRAPE INCISE IOBAN 66X45 STRL (DRAPES) IMPLANT
DRAPE ORTHO SPLIT 77X108 STRL (DRAPES) ×2
DRAPE POUCH INSTRU U-SHP 10X18 (DRAPES) ×2 IMPLANT
DRAPE SURG 17X11 SM STRL (DRAPES) ×4 IMPLANT
DRAPE SURG ORHT 6 SPLT 77X108 (DRAPES) ×2 IMPLANT
DRSG AQUACEL AG ADV 3.5X14 (GAUZE/BANDAGES/DRESSINGS) ×2 IMPLANT
DURAPREP 26ML APPLICATOR (WOUND CARE) ×4 IMPLANT
ELECT CAUTERY BLADE 6.4 (BLADE) ×2 IMPLANT
ELECT REM PT RETURN 9FT ADLT (ELECTROSURGICAL) ×2
ELECTRODE REM PT RTRN 9FT ADLT (ELECTROSURGICAL) ×1 IMPLANT
FEMUR OXINIUM SZ 5 RT (Knees) ×2 IMPLANT
GLOVE BIOGEL PI IND STRL 7.0 (GLOVE) ×1 IMPLANT
GLOVE BIOGEL PI INDICATOR 7.0 (GLOVE) ×1
GLOVE ECLIPSE 7.0 STRL STRAW (GLOVE) ×2 IMPLANT
GLOVE SKINSENSE NS SZ7.5 (GLOVE) ×1
GLOVE SKINSENSE STRL SZ7.5 (GLOVE) ×1 IMPLANT
GLOVE SURG SYN 7.5  E (GLOVE) ×4
GLOVE SURG SYN 7.5 E (GLOVE) ×4 IMPLANT
GOWN STRL REIN XL XLG (GOWN DISPOSABLE) ×2 IMPLANT
GOWN STRL REUS W/ TWL LRG LVL3 (GOWN DISPOSABLE) ×1 IMPLANT
GOWN STRL REUS W/TWL LRG LVL3 (GOWN DISPOSABLE) ×1
HANDPIECE INTERPULSE COAX TIP (DISPOSABLE) ×1
HOOD PEEL AWAY FLYTE STAYCOOL (MISCELLANEOUS) ×4 IMPLANT
INSERT XLPE 9MM SZ 3-4 (Knees) ×2 IMPLANT
KIT BASIN OR (CUSTOM PROCEDURE TRAY) ×2 IMPLANT
KIT ROOM TURNOVER OR (KITS) ×2 IMPLANT
MANIFOLD NEPTUNE II (INSTRUMENTS) ×2 IMPLANT
MARKER SKIN DUAL TIP RULER LAB (MISCELLANEOUS) ×2 IMPLANT
NEEDLE SPNL 18GX3.5 QUINCKE PK (NEEDLE) ×2 IMPLANT
NS IRRIG 1000ML POUR BTL (IV SOLUTION) ×2 IMPLANT
PACK TOTAL JOINT (CUSTOM PROCEDURE TRAY) ×2 IMPLANT
PAD ARMBOARD 7.5X6 YLW CONV (MISCELLANEOUS) ×4 IMPLANT
PATELLA RESURF GEN II 32MM (Orthopedic Implant) ×2 IMPLANT
SAW OSC TIP CART 19.5X105X1.3 (SAW) ×2 IMPLANT
SET HNDPC FAN SPRY TIP SCT (DISPOSABLE) ×1 IMPLANT
STAPLER VISISTAT 35W (STAPLE) IMPLANT
SUCTION FRAZIER HANDLE 10FR (MISCELLANEOUS) ×1
SUCTION TUBE FRAZIER 10FR DISP (MISCELLANEOUS) ×1 IMPLANT
SUT ETHILON 2 0 FS 18 (SUTURE) IMPLANT
SUT MNCRL AB 4-0 PS2 18 (SUTURE) IMPLANT
SUT VIC AB 0 CT1 27 (SUTURE) ×2
SUT VIC AB 0 CT1 27XBRD ANBCTR (SUTURE) ×2 IMPLANT
SUT VIC AB 1 CTX 27 (SUTURE) ×6 IMPLANT
SUT VIC AB 2-0 CT1 27 (SUTURE) ×3
SUT VIC AB 2-0 CT1 TAPERPNT 27 (SUTURE) ×3 IMPLANT
SYR 50ML LL SCALE MARK (SYRINGE) ×2 IMPLANT
TIBIA RIGHT SZ 4 (Knees) ×2 IMPLANT
TOWEL OR 17X24 6PK STRL BLUE (TOWEL DISPOSABLE) ×2 IMPLANT
TOWEL OR 17X26 10 PK STRL BLUE (TOWEL DISPOSABLE) ×2 IMPLANT
TRAY CATH 16FR W/PLASTIC CATH (SET/KITS/TRAYS/PACK) IMPLANT
UNDERPAD 30X30 (UNDERPADS AND DIAPERS) ×2 IMPLANT
WRAP KNEE MAXI GEL POST OP (GAUZE/BANDAGES/DRESSINGS) ×2 IMPLANT

## 2017-09-19 NOTE — Progress Notes (Signed)
PT Cancellation Note  Patient Details Name: Rachel Crane MRN: [id number] DOB: 1945/10/12   Cancelled Treatment:    Reason Eval/Treat Not Completed: Pain limiting ability to participate Pt reporting she is in too much pain to participate. Requesting to PT to come back later. Will follow up as schedule allows.   Leighton Ruff, PT, DPT  Acute Rehabilitation Services  Pager: [phone]  Rudean Hitt 09/19/2017, 4:50 PM

## 2017-09-19 NOTE — Anesthesia Procedure Notes (Addendum)
Anesthesia Regional Block: Adductor canal block   Pre-Anesthetic Checklist: ,, timeout performed, Correct Patient, Correct Site, Correct Laterality, Correct Procedure, Correct Position, site marked, Risks and benefits discussed,  Surgical consent,  Pre-op evaluation,  At surgeon's request and post-op pain management  Laterality: Right  Prep: chloraprep       Needles:  Injection technique: Single-shot  Needle Type: Echogenic Needle     Needle Length: 9cm  Needle Gauge: 21     Additional Needles:   Procedures:,,,, ultrasound used (permanent image in chart),,,,  Narrative:  Start time: 09/19/2017 11:07 AM End time: 09/19/2017 11:14 AM Injection made incrementally with aspirations every 5 mL.  Performed by: Personally  Anesthesiologist: Barnet Glasgow, MD

## 2017-09-19 NOTE — Discharge Instructions (Signed)

## 2017-09-19 NOTE — Op Note (Signed)
Total Knee Arthroplasty Procedure Note  Preoperative diagnosis: Right knee osteoarthritis  Postoperative diagnosis:same  Operative procedure: Right total knee arthroplasty. CPT [phone]  Surgeon: N. Eduard Roux, MD  Assist: Madalyn Rob, PA-C; necessary for the timely completion of procedure and due to complexity of procedure.  Anesthesia: Spinal, regional  Tourniquet time: 60 mins  Implants used: S&N Femur: PS 5 Tibia: 4 Patella: 32 mm, 9 thick Polyethylene: 9 mm  Indication: Rachel Crane is a 72 y.o. year old female with a history of knee pain. Having failed conservative management, the patient elected to proceed with a total knee arthroplasty.  We have reviewed the risk and benefits of the surgery and they elected to proceed after voicing understanding.  Procedure:  After informed consent was obtained and understanding of the risk were voiced including but not limited to bleeding, infection, damage to surrounding structures including nerves and vessels, blood clots, leg length inequality and the failure to achieve desired results, the operative extremity was marked with verbal confirmation of the patient in the holding area.   The patient was then brought to the operating room and transported to the operating room table in the supine position.  A tourniquet was applied to the operative extremity around the upper thigh. The operative limb was then prepped and draped in the usual sterile fashion and preoperative antibiotics were administered.  A time out was performed prior to the start of surgery confirming the correct extremity, preoperative antibiotic administration, as well as team members, implants and instruments available for the case. Correct surgical site was also confirmed with preoperative radiographs. The limb was then elevated for exsanguination and the tourniquet was inflated. A midline incision was made and a standard medial parapatellar approach was performed.   The patella was prepared and sized to a 32 mm.  A cover was placed on the patella for protection from retractors.  We then turned our attention to the femur. Posterior cruciate ligament was sacrificed. Start site was drilled in the femur and the intramedullary distal femoral cutting guide was placed, set at 5 degrees valgus, taking 9 mm of distal resection. The distal cut was made. Osteophytes were then removed. Next, the proximal tibial cutting guide was placed with appropriate slope, varus/valgus alignment and depth of resection. The proximal tibial cut was made. Gap blocks were then used to assess the extension gap and alignment, and appropriate soft tissue releases were performed. Attention was turned back to the femur, which was sized using the sizing guide to a size 5. Appropriate rotation of the femoral component was determined using epicondylar axis, Whiteside's line, and assessing the flexion gap under ligament tension. The appropriate size 4-in-1 cutting block was placed and cuts were made. Posterior femoral osteophytes and uncapped bone were then removed with the curved osteotome. The tibia was sized for a size 4 component. The femoral box-cutting guide was placed and prepared for a PS femoral component. Trial components were placed, and stability was checked in full extension, mid-flexion, and deep flexion. Proper tibial rotation was determined and marked.  The patella tracked well without a lateral release. Trial components were then removed and tibial preparation performed. A posterior capsular injection comprising of 20 cc of 1.3% exparel and 40 cc of normal saline was performed for postoperative pain control. The bony surfaces were irrigated with a pulse lavage and then dried. Bone cement was vacuum mixed on the back table, and the final components sized above were cemented into place. After cement had finished  curing, excess cement was removed. The stability of the construct was re-evaluated  throughout a range of motion and found to be acceptable. The trial liner was removed, the knee was copiously irrigated, and the knee was re-evaluated for any excess bone debris. The real polyethylene liner, 9 mm thick, was inserted and checked to ensure the locking mechanism had engaged appropriately. The tourniquet was deflated and hemostasis was achieved. The wound was irrigated with normal saline.  One gram of vancomycin powder was placed in the surgical bed. A drain was not placed. Capsular closure was performed with a #1 vicryl, subcutaneous fat closed with a 0 vicryl suture, then subcutaneous tissue closed with interrupted 2.0 vicryl suture. The skin was then closed with a 3.0 monocryl. A sterile dressing was applied.  The patient was awakened in the operating room and taken to recovery in stable condition. All sponge, needle, and instrument counts were correct at the end of the case.  Position: supine  Complications: none.  Time Out: performed   Drains/Packing: none  Estimated blood loss: minimal  Returned to Recovery Room: in good condition.   Antibiotics: yes   Mechanical VTE (DVT) Prophylaxis: sequential compression devices, TED thigh-high  Chemical VTE (DVT) Prophylaxis: aspirin  Fluid Replacement  Crystalloid: see anesthesia record Blood: none  FFP: none   Specimens Removed: 1 to pathology   Sponge and Instrument Count Correct? yes   PACU: portable radiograph - knee AP and Lateral   Admission: inpatient status  Plan/RTC: Return in 2 weeks for wound check.   Weight Bearing/Load Lower Extremity: full   N. Eduard Roux, MD [place] 2:22 PM

## 2017-09-19 NOTE — Anesthesia Procedure Notes (Signed)
Procedure Name: MAC Date/Time: 09/19/2017 12:50 PM Performed by: Harden Mo, CRNA Pre-anesthesia Checklist: Patient identified, Emergency Drugs available, Suction available and Patient being monitored Patient Re-evaluated:Patient Re-evaluated prior to induction Oxygen Delivery Method: Simple face mask Preoxygenation: Pre-oxygenation with 100% oxygen Induction Type: IV induction Placement Confirmation: positive ETCO2 and breath sounds checked- equal and bilateral Dental Injury: Teeth and Oropharynx as per pre-operative assessment

## 2017-09-19 NOTE — Progress Notes (Signed)
Pt is refusing to use CPM due to pain level. Patient rates pain 10/10, have given pain medication per [facility] and MD orders. Pt c/o of not eating. Have called the kitchen x2 requesting meal tray. Pt refuses snacks offered on floor.

## 2017-09-19 NOTE — Transfer of Care (Signed)
Immediate Anesthesia Transfer of Care Note  Patient: Rachel Crane  Procedure(s) Performed: RIGHT TOTAL KNEE ARTHROPLASTY (Right Knee)  Patient Location: PACU  Anesthesia Type:MAC and Spinal  Level of Consciousness: awake, alert  and oriented  Airway & Oxygen Therapy: Patient Spontanous Breathing  Post-op Assessment: Report given to RN, Post -op Vital signs reviewed and stable and Patient moving all extremities X 4  Post vital signs: Reviewed and stable  Last Vitals:  Vitals:   09/19/17 1120 09/19/17 1125  BP: (!) 162/73 (!) 167/80  Pulse: 65 62  Resp: (!) 9 14  Temp:    SpO2: 100% 100%    Last Pain:  Vitals:   09/19/17 1025  TempSrc: Oral      Patients Stated Pain Goal: 2 (68/03/21 2248)  Complications: No apparent anesthesia complications

## 2017-09-19 NOTE — H&P (Signed)
PREOPERATIVE H&P  Chief Complaint: right knee degenerative joint disease  HPI: Rachel Crane is a 72 y.o. female who presents for surgical treatment of right knee degenerative joint disease.  She denies any changes in medical history.  Past Medical History:  Diagnosis Date  . Arthritis   . Headache    H/O bad HA, since using CPAP- she has had improvement with that issue   . Hypertension   . OSA on CPAP    settting - 8, uses CPAP q night    Past Surgical History:  Procedure Laterality Date  . CHOLECYSTECTOMY  1998  . TUBAL LIGATION     Social History   Socioeconomic History  . Marital status: Widowed    Spouse name: None  . Number of children: None  . Years of education: None  . Highest education level: None  Social Needs  . Financial resource strain: None  . Food insecurity - worry: None  . Food insecurity - inability: None  . Transportation needs - medical: None  . Transportation needs - non-medical: None  Occupational History  . None  Tobacco Use  . Smoking status: Never Smoker  . Smokeless tobacco: Never Used  Substance and Sexual Activity  . Alcohol use: No  . Drug use: No  . Sexual activity: Not Currently  Other Topics Concern  . None  Social History Narrative  . None   Family History  Problem Relation Age of Onset  . Breast cancer Sister    No Known Allergies Prior to Admission medications   Medication Sig Start Date End Date Taking? Authorizing Provider  chlorthalidone (HYGROTON) 25 MG tablet Take 0.5 tablets (12.5 mg total) daily by mouth. 05/21/17  Yes Clent Demark, PA-C  cholecalciferol (VITAMIN D) 1000 units tablet Take 2,000 Units by mouth daily.    Yes [provider]  HYDROcodone-Acetaminophen 2.5-325 MG TABS Take 1 tablet by mouth every 12 (twelve) hours. 09/10/17  Yes Clent Demark, PA-C  losartan (COZAAR) 100 MG tablet Take 1 tablet (100 mg total) by mouth daily. 02/18/17  Yes Clent Demark, PA-C  lovastatin  (MEVACOR) 20 MG tablet Take 1 tablet (20 mg total) by mouth at bedtime. Patient not taking: Reported on 09/10/2017 02/25/17  Yes Clent Demark, PA-C  potassium chloride (K-DUR) 10 MEQ tablet Take 1 tablet (10 mEq total) by mouth 2 (two) times daily for 14 days. 09/12/17 09/26/17 Yes Clent Demark, PA-C  azelastine (ASTELIN) 0.1 % nasal spray Place 2 sprays into both nostrils 2 (two) times daily. Patient taking differently: Place 2 sprays into both nostrils 2 (two) times daily as needed.  04/12/17   Kennith Gain, MD  potassium chloride (K-DUR,KLOR-CON) 10 MEQ tablet Take 4 tablets (40 mEq total) by mouth 2 (two) times daily for 4 days. 09/12/17 09/16/17  Aundra Dubin, PA-C     Positive ROS: All other systems have been reviewed and were otherwise negative with the exception of those mentioned in the HPI and as above.  Physical Exam: General: Alert, no acute distress Cardiovascular: No pedal edema Respiratory: No cyanosis, no use of accessory musculature GI: abdomen soft Skin: No lesions in the area of chief complaint Neurologic: Sensation intact distally Psychiatric: Patient is competent for consent with normal mood and affect Lymphatic: no lymphedema  MUSCULOSKELETAL: exam stable  Assessment: right knee degenerative joint disease  Plan: Plan for Procedure(s): RIGHT TOTAL KNEE ARTHROPLASTY  The risks benefits and alternatives were discussed with the patient including  but not limited to the risks of nonoperative treatment, versus surgical intervention including infection, bleeding, nerve injury,  blood clots, cardiopulmonary complications, morbidity, mortality, among others, and they were willing to proceed.   Eduard Roux, MD   09/19/2017 12:32 PM

## 2017-09-19 NOTE — Anesthesia Preprocedure Evaluation (Signed)
Anesthesia Evaluation  Patient identified by MRN, date of birth, ID band Patient awake    Reviewed: Allergy & Precautions, NPO status , Patient's Chart, lab work & pertinent test results  Airway Mallampati: II  TM Distance: >3 FB Neck ROM: Full    Dental no notable dental hx.    Pulmonary sleep apnea ,    Pulmonary exam normal breath sounds clear to auscultation       Cardiovascular Exercise Tolerance: Good hypertension, Normal cardiovascular exam Rhythm:Regular Rate:Normal     Neuro/Psych  Headaches,    GI/Hepatic negative GI ROS, Neg liver ROS,   Endo/Other    Renal/GU      Musculoskeletal   Abdominal   Peds  Hematology   Anesthesia Other Findings   Reproductive/Obstetrics                             Lab Results  Component Value Date   WBC 8.2 09/11/2017   HGB 12.0 09/11/2017   HCT 35.6 (L) 09/11/2017   MCV 82.4 09/11/2017   PLT 203 09/11/2017   Lab Results  Component Value Date   CREATININE 1.01 (H) 09/11/2017   BUN 18 09/11/2017   NA 138 09/11/2017   K 3.0 (L) 09/11/2017   CL 103 09/11/2017   CO2 24 09/11/2017     Anesthesia Physical Anesthesia Plan  ASA: II  Anesthesia Plan: Regional and Spinal   Post-op Pain Management:  Regional for Post-op pain   Induction:   PONV Risk Score and Plan: Treatment may vary due to age or medical condition  Airway Management Planned: Mask, Natural Airway and Nasal Cannula  Additional Equipment:   Intra-op Plan:   Post-operative Plan:   Informed Consent: I have reviewed the patients History and Physical, chart, labs and discussed the procedure including the risks, benefits and alternatives for the proposed anesthesia with the patient or authorized representative who has indicated his/her understanding and acceptance.     Plan Discussed with: CRNA  Anesthesia Plan Comments:         Anesthesia Quick Evaluation

## 2017-09-19 NOTE — Progress Notes (Signed)
PT Cancellation Note  Patient Details Name: Rachel Crane MRN: [id number] DOB: Apr 11, 1946   Cancelled Treatment:    Reason Eval/Treat Not Completed: Pain limiting ability to participate Pt continues to report increased pain which limits ability to participate with PT. Will follow up as schedule allows.   Leighton Ruff, PT, DPT  Acute Rehabilitation Services  Pager: [phone]   Rudean Hitt 09/19/2017, 5:54 PM

## 2017-09-20 ENCOUNTER — Encounter (HOSPITAL_COMMUNITY): Payer: Self-pay | Admitting: Orthopaedic Surgery

## 2017-09-20 LAB — BASIC METABOLIC PANEL
Anion gap: 10 (ref 5–15)
BUN: 12 mg/dL (ref 6–20)
CO2: 25 mmol/L (ref 22–32)
Calcium: 8.3 mg/dL — ABNORMAL LOW (ref 8.9–10.3)
Chloride: 102 mmol/L (ref 101–111)
Creatinine, Ser: 1.06 mg/dL — ABNORMAL HIGH (ref 0.44–1.00)
GFR calc Af Amer: >60 mL/min (ref >60)
GFR calc non Af Amer: 52 mL/min — ABNORMAL LOW (ref >60)
Glucose, Bld: 109 mg/dL — ABNORMAL HIGH (ref 65–99)
Potassium: 4 mmol/L (ref 3.5–5.1)
Sodium: 137 mmol/L (ref 135–145)

## 2017-09-20 LAB — CBC
HCT: 30.7 % — ABNORMAL LOW (ref 36.0–46.0)
Hemoglobin: 9.9 g/dL — ABNORMAL LOW (ref 12.0–15.0)
MCH: 27 pg (ref 26.0–34.0)
MCHC: 32.2 g/dL (ref 30.0–36.0)
MCV: 83.7 fL (ref 78.0–100.0)
Platelets: 157 10*3/uL (ref 150–400)
RBC: 3.67 MIL/uL — ABNORMAL LOW (ref 3.87–5.11)
RDW: 15.4 % (ref 11.5–15.5)
WBC: 9.2 10*3/uL (ref 4.0–10.5)

## 2017-09-20 MED FILL — Vancomycin HCl For IV Soln 1 GM (Base Equivalent): INTRAVENOUS | Qty: 1000 | Status: AC

## 2017-09-20 NOTE — Progress Notes (Signed)
Patient is progressing with PT.  However, patient lives alone and will need SNF placement post discharge from hospital.  Appreciate CM/SW assistance with this.

## 2017-09-20 NOTE — Progress Notes (Signed)
Subjective: 1 Day Post-Op Procedure(s) (LRB): RIGHT TOTAL KNEE ARTHROPLASTY (Right) Patient reports pain as moderate.    Objective: Vital signs in last 24 hours: Temp:  [97.7 F (36.5 C)-98.5 F (36.9 C)] 98.1 F (36.7 C) (03/08 0601) Pulse Rate:  [52-90] 71 (03/08 0601) Resp:  [9-18] 18 (03/08 0601) BP: (112-183)/(41-82) 112/41 (03/08 0601) SpO2:  [96 %-100 %] 96 % (03/08 0601) Weight:  [205 lb 9.6 oz (93.3 kg)] 205 lb 9.6 oz (93.3 kg) (03/07 1025)  Intake/Output from previous day: 03/07 0701 - 03/08 0700 In: 2027.5 [P.O.:240; I.V.:1587.5; IV Piggyback:200] Out: 1500 [Urine:1300; Blood:200] Intake/Output this shift: No intake/output data recorded.  Recent Labs    09/20/17 0445  HGB 9.9*   Recent Labs    09/20/17 0445  WBC 9.2  RBC 3.67*  HCT 30.7*  PLT 157   Recent Labs    09/20/17 0445  NA 137  K 4.0  CL 102  CO2 25  BUN 12  CREATININE 1.06*  GLUCOSE 109*  CALCIUM 8.3*   No results for input(s): LABPT, INR in the last 72 hours.  Neurologically intact Neurovascular intact Sensation intact distally Intact pulses distally Dorsiflexion/Plantar flexion intact Incision: dressing C/D/I No cellulitis present Compartment soft  No drainage noted to ace bandage  Assessment/Plan: 1 Day Post-Op Procedure(s) (LRB): RIGHT TOTAL KNEE ARTHROPLASTY (Right) Advance diet Up with therapy  WBAT RLE ABLA-mild and stable Plan for d/c over the weekend Please remove ace bandage and apply ted hose  Rachel Crane 09/20/2017, 7:35 AM

## 2017-09-20 NOTE — Progress Notes (Signed)
Humidity provided for the patient. Education given to patient on the proper use of the CPAP machine. Pt states that she will wear the machine once she is ready for bed. 8.5cmh20 is what is comfortable she stated. Pt is stable at this time no distress or complications noted.

## 2017-09-20 NOTE — NC FL2 (Signed)
[place] LEVEL OF CARE SCREENING TOOL     IDENTIFICATION  Patient Name: Rachel Crane Birthdate: 02-23-1946 Sex: female Admission Date (Current Location): 09/19/2017  [facility] and [place] Number:  Herbalist and Address:  [facility], [place] [address] [place], [place] [address]      Provider Number: [id number]  Attending Physician Name and Address:  Leandrew Koyanagi, MD  Relative Name and Phone Number:       Current Level of Care: Hospital Recommended Level of Care: [place] Prior Approval Number:    Date Approved/Denied:   PASRR Number: [id number]  Discharge Plan: SNF    Current Diagnoses: Patient Active Problem List   Diagnosis Date Noted  . Total knee replacement status 09/19/2017  . Sprain of anterior talofibular ligament of right ankle 12/13/2016    Orientation RESPIRATION BLADDER Height & Weight     Situation, Time, Self, Place  Normal Continent Weight: 205 lb 9.6 oz (93.3 kg) Height:  5\' 6"  (167.6 cm)  BEHAVIORAL SYMPTOMS/MOOD NEUROLOGICAL BOWEL NUTRITION STATUS      Continent Diet(Regular diet, thin liquids)  AMBULATORY STATUS COMMUNICATION OF NEEDS Skin   Limited Assist Verbally Surgical wounds(Closed incision right knee, compression wrap)                       Personal Care Assistance Level of Assistance  Bathing, Feeding, Dressing Bathing Assistance: Limited assistance Feeding assistance: Independent Dressing Assistance: Limited assistance     Functional Limitations Info  Sight, Hearing, Speech Sight Info: Adequate Hearing Info: Adequate Speech Info: Adequate    SPECIAL CARE FACTORS FREQUENCY  OT (By licensed OT), PT (By licensed PT)     PT Frequency: 2x OT Frequency: 2x            Contractures Contractures Info: Not present    Additional Factors Info  Code Status, Allergies Code Status Info: Full Code Allergies Info: No known allergies           Current  Medications (09/20/2017):  This is the current hospital active medication list Current Facility-Administered Medications  Medication Dose Route Frequency Provider Last Rate Last Dose  . 0.9 %  sodium chloride infusion   Intravenous Continuous Aundra Dubin, PA-C 50 mL/hr at 09/20/17 0810    . acetaminophen (TYLENOL) tablet 325-650 mg  325-650 mg Oral Q6H PRN Leandrew Koyanagi, MD   650 mg at 09/19/17 1653  . alum & mag hydroxide-simeth (MAALOX/MYLANTA) 200-200-20 MG/5ML suspension 30 mL  30 mL Oral Q4H PRN Leandrew Koyanagi, MD      . aspirin EC tablet 325 mg  325 mg Oral BID Leandrew Koyanagi, MD   325 mg at 09/20/17 0847  . azelastine (ASTELIN) 0.1 % nasal spray 2 spray  2 spray Each Nare BID PRN Leandrew Koyanagi, MD      . chlorthalidone (HYGROTON) tablet 12.5 mg  12.5 mg Oral Daily Leandrew Koyanagi, MD      . diphenhydrAMINE (BENADRYL) 12.5 MG/5ML elixir 25 mg  25 mg Oral Q4H PRN Leandrew Koyanagi, MD      . docusate sodium (COLACE) capsule 100 mg  100 mg Oral BID Leandrew Koyanagi, MD   100 mg at 09/20/17 0847  . HYDROmorphone (DILAUDID) injection 0.5-1 mg  0.5-1 mg Intravenous Q4H PRN Leandrew Koyanagi, MD   1 mg at 09/20/17 4481  . ketorolac (TORADOL) 15 MG/ML injection 15 mg  15 mg  Intravenous Q6H Leandrew Koyanagi, MD   15 mg at 09/20/17 0523  . losartan (COZAAR) tablet 100 mg  100 mg Oral Daily Leandrew Koyanagi, MD   100 mg at 09/20/17 0848  . magnesium citrate solution 1 Bottle  1 Bottle Oral Once PRN Leandrew Koyanagi, MD      . menthol-cetylpyridinium (CEPACOL) lozenge 3 mg  1 lozenge Oral PRN Leandrew Koyanagi, MD       Or  . phenol (CHLORASEPTIC) mouth spray 1 spray  1 spray Mouth/Throat PRN Leandrew Koyanagi, MD      . methocarbamol (ROBAXIN) tablet 500 mg  500 mg Oral Q6H PRN Leandrew Koyanagi, MD   500 mg at 09/20/17 0847   Or  . methocarbamol (ROBAXIN) 500 mg in dextrose 5 % 50 mL IVPB  500 mg Intravenous Q6H PRN Leandrew Koyanagi, MD      . metoCLOPramide (REGLAN) tablet 5-10 mg  5-10 mg Oral Q8H PRN Leandrew Koyanagi, MD       Or   . metoCLOPramide (REGLAN) injection 5-10 mg  5-10 mg Intravenous Q8H PRN Leandrew Koyanagi, MD   10 mg at 09/20/17 0856  . ondansetron (ZOFRAN) tablet 4 mg  4 mg Oral Q6H PRN Leandrew Koyanagi, MD       Or  . ondansetron [facility]) injection 4 mg  4 mg Intravenous Q6H PRN Leandrew Koyanagi, MD   4 mg at 09/20/17 0810  . oxyCODONE (Oxy IR/ROXICODONE) immediate release tablet 10-15 mg  10-15 mg Oral Q4H PRN Leandrew Koyanagi, MD   10 mg at 09/19/17 1854  . oxyCODONE (Oxy IR/ROXICODONE) immediate release tablet 5-10 mg  5-10 mg Oral Q4H PRN Leandrew Koyanagi, MD      . oxyCODONE (OXYCONTIN) 12 hr tablet 10 mg  10 mg Oral Q12H Leandrew Koyanagi, MD   10 mg at 09/20/17 0846  . polyethylene glycol (MIRALAX / GLYCOLAX) packet 17 g  17 g Oral Daily PRN Leandrew Koyanagi, MD      . potassium chloride (K-DUR,KLOR-CON) CR tablet 10 mEq  10 mEq Oral BID Leandrew Koyanagi, MD   10 mEq at 09/20/17 0846  . sorbitol 70 % solution 30 mL  30 mL Oral Daily PRN Leandrew Koyanagi, MD         Discharge Medications: Please see discharge summary for a list of discharge medications.  Relevant Imaging Results:  Relevant Lab Results:   Additional Information SSN: [id number]  Eileen Stanford, LCSW

## 2017-09-20 NOTE — Evaluation (Signed)
Physical Therapy Evaluation Patient Details Name: Rachel Crane MRN: [id number] DOB: 1945/12/21 Today's Date: 09/20/2017   History of Present Illness  Pt. is a 72 y.o. F with significant PMH of OSA on CPAP and HTN admitted s/p R TKA.   Clinical Impression  Pt is s/p right TKA resulting in the deficits listed below (see PT Problem List). Evaluation very limited by patient pain, nausea, and vomiting. Patient performing transfers and ambulation from bed to chair (5 feet) with min guard assist and RW. Overall, patient very lethargic throughout and demonstrates impulsivity and decreased safety awareness with attempting to transfer before set up. Patient states she is going to rehab when she leaves since she lives alone. Patient will likely demonstrate significant improvement once pain/nausea/vomiting is well controlled, but currently presents as a fall risk. Pt will benefit from skilled PT to increase their independence and safety with mobility to allow discharge to the venue listed below.      Follow Up Recommendations Follow surgeon's recommendation for DC plan and follow-up therapies    Equipment Recommendations  Rolling walker with 5" wheels;3in1 (PT)    Recommendations for Other Services       Precautions / Restrictions Precautions Precautions: Fall Restrictions Weight Bearing Restrictions: Yes RLE Weight Bearing: Weight bearing as tolerated      Mobility  Bed Mobility Overal bed mobility: Needs Assistance Bed Mobility: Supine to Sit     Supine to sit: Supervision;HOB elevated     General bed mobility comments: Supervision for safety with bed mobility  Transfers Overall transfer level: Needs assistance Equipment used: Rolling walker (2 wheeled) Transfers: Sit to/from Stand Sit to Stand: Min guard         General transfer comment: Min guard required for safety during transfers. Patient displayed impulsivity and initially attempted to transfer without RW with feet spread  very widely apart. Instructed on sit to stand with narrower base of support, feet shoulder width apart and provided max verbal cueing for hand placement and placing feet inside RW.   Ambulation/Gait Ambulation/Gait assistance: Min guard Ambulation Distance (Feet): 5 Feet Assistive device: Rolling walker (2 wheeled) Gait Pattern/deviations: Step-to pattern;Decreased step length - right     General Gait Details: Patient ambulated from bed to chair with RW and min guard. Slow, guarded gait with trunk flexion.   Stairs            Wheelchair Mobility    Modified Rankin (Stroke Patients Only)       Balance Overall balance assessment: Needs assistance Sitting-balance support: No upper extremity supported;Feet supported Sitting balance-Leahy Scale: Good     Standing balance support: No upper extremity supported Standing balance-Leahy Scale: Fair                               Pertinent Vitals/Pain Pain Assessment: 0-10 Pain Score: 6  Pain Location: R knee Pain Descriptors / Indicators: Grimacing Pain Intervention(s): Ice applied;RN gave pain meds during session;Monitored during session;Limited activity within patient's tolerance    Home Living Family/patient expects to be discharged to:: Private residence Living Arrangements: Alone   Type of Home: House Home Access: Stairs to enter Entrance Stairs-Rails: None Entrance Stairs-Number of Steps: 1 Home Layout: One level Home Equipment: None      Prior Function Level of Independence: Independent         Comments: Works at [employer]  Extremity/Trunk Assessment   Upper Extremity Assessment Upper Extremity Assessment: RUE deficits/detail;LUE deficits/detail RUE Deficits / Details: MMT: shoulder flexion 5/5, elbow flexion 5/5, grip 5/5 LUE Deficits / Details: MMT: Shoulder flexion 4/5, elbow flexion 5/5, grip 5/5    Lower Extremity Assessment Lower Extremity Assessment:  RLE deficits/detail;LLE deficits/detail RLE Deficits / Details: MMT: Hip flexion and knee extension at least 3/5 but not formally assessed due to pain. Ankle dorsiflexion 5/5  LLE Deficits / Details: MMT: Grossly 4/5     Cervical / Trunk Assessment Cervical / Trunk Assessment: Other exceptions Cervical / Trunk Exceptions: Rounded shoulders   Communication   Communication: No difficulties  Cognition Arousal/Alertness: Lethargic Behavior During Therapy: Impulsive Overall Cognitive Status: No family/caregiver present to determine baseline cognitive functioning                                 General Comments: Patient impulsive and attempting to transfer before set up and positioned. Was very lethargic throughout evaluation.      General Comments General comments (skin integrity, edema, etc.): Patient educated on utilizing call bell to call for assistance for transfers/mobility and RLE positioning.     Exercises Total Joint Exercises Ankle Circles/Pumps: 20 reps;Both Quad Sets: 15 reps;Both   Assessment/Plan    PT Assessment Patient needs continued PT services  PT Problem List Decreased strength;Decreased range of motion;Decreased activity tolerance;Decreased balance;Decreased mobility;Decreased knowledge of use of DME;Decreased safety awareness       PT Treatment Interventions DME instruction;Gait training;Stair training;Functional mobility training;Therapeutic activities;Therapeutic exercise;Balance training;Patient/family education;Neuromuscular re-education;Modalities    PT Goals (Current goals can be found in the Care Plan section)  Acute Rehab PT Goals Patient Stated Goal: Go to rehab PT Goal Formulation: With patient Time For Goal Achievement: 09/25/17 Potential to Achieve Goals: Good    Frequency BID   Barriers to discharge Decreased caregiver support Patient lives alone and has no support.    Co-evaluation               AM-PAC PT "6 Clicks"  Daily Activity  Outcome Measure Difficulty turning over in bed (including adjusting bedclothes, sheets and blankets)?: A Little Difficulty moving from lying on back to sitting on the side of the bed? : A Little Difficulty sitting down on and standing up from a chair with arms (e.g., wheelchair, bedside commode, etc,.)?: A Little Help needed moving to and from a bed to chair (including a wheelchair)?: A Little Help needed walking in hospital room?: A Little Help needed climbing 3-5 steps with a railing? : A Lot 6 Click Score: 17    End of Session Equipment Utilized During Treatment: Gait belt Activity Tolerance: Patient limited by pain;Other (comment)(Patient limited by nausea and vomiting) Patient left: with chair alarm set;with call bell/phone within reach;in chair Nurse Communication: Mobility status;Patient requests pain meds PT Visit Diagnosis: Muscle weakness (generalized) (M62.81);Pain;Other abnormalities of gait and mobility (R26.89) Pain - Right/Left: Right Pain - part of body: Knee    Time: 1540-0867 PT Time Calculation (min) (ACUTE ONLY): 44 min   Charges:   PT Evaluation $PT Eval Moderate Complexity: 1 Mod PT Treatments $Therapeutic Activity: 23-37 mins   PT G Codes:       Ellamae Sia, PT, DPT Acute Rehabilitation Services    Willy Eddy 09/20/2017, 9:01 AM

## 2017-09-20 NOTE — Care Management (Signed)
Case manager spoke with patient concerning discharge plan. Patient says she will be going to shortterm rehab. Patient says that she wants to go to a facility in [place] to be near her children. Her daughter will transport her at time of discharge. Patient says her daughter can answer questions about location or area of [place] that works for them. Patient's Daughter is Andreas Blower [phone]. Case manager has updated Education officer, museum.

## 2017-09-20 NOTE — Progress Notes (Signed)
Physical Therapy Treatment Patient Details Name: Rachel Crane MRN: [id number] DOB: Nov 30, 1945 Today's Date: 09/20/2017    History of Present Illness Pt. is a 72 y.o. F with significant PMH of OSA on CPAP and HTN admitted s/p R TKA.     PT Comments    Patient is progressing very well towards their physical therapy goals. Patient performed significantly better this session secondary to improved pain control and no symptoms of nausea. Able to ambulate > 150 feet this session with RW and min guard. Patient would benefit from further gait training and therapeutic exercise to promote strengthening and range of motion.     Follow Up Recommendations  Follow surgeon's recommendation for DC plan and follow-up therapies     Equipment Recommendations  Rolling walker with 5" wheels;3in1 (PT)    Recommendations for Other Services       Precautions / Restrictions Precautions Precautions: Fall Restrictions Weight Bearing Restrictions: Yes RLE Weight Bearing: Weight bearing as tolerated    Mobility  Bed Mobility Overal bed mobility: Needs Assistance Bed Mobility: Supine to Sit     Supine to sit: Supervision;HOB elevated     General bed mobility comments: Supervision for safety with bed mobility  Transfers Overall transfer level: Needs assistance Equipment used: Rolling walker (2 wheeled) Transfers: Sit to/from Stand Sit to Stand: Min guard         General transfer comment: Min guard and elevated bed height required for safety during transfers.   Ambulation/Gait Ambulation/Gait assistance: Min guard Ambulation Distance (Feet): 300 Feet Assistive device: Rolling walker (2 wheeled) Gait Pattern/deviations: Step-to pattern;Step-through pattern   Gait velocity interpretation: Below normal speed for age/gender General Gait Details: Patient provided VCs for RW proximity, gait pattern sequencing, rolling the RW rather than picking it up, and upright posture. Progressed from step to to  step through pattern.    Stairs            Wheelchair Mobility    Modified Rankin (Stroke Patients Only)       Balance Overall balance assessment: Needs assistance Sitting-balance support: No upper extremity supported;Feet supported Sitting balance-Leahy Scale: Good     Standing balance support: No upper extremity supported Standing balance-Leahy Scale: Fair                              Cognition Arousal/Alertness: Awake/alert   Overall Cognitive Status: No family/caregiver present to determine baseline cognitive functioning                                 General Comments: Patient with some confusion, stating a man from physical therapy came in earlier, which was inaccurate.       Exercises Total Joint Exercises Heel Slides: Seated;5 reps;Right Goniometric ROM: 10-95 degrees     General Comments General comments (skin integrity, edema, etc.): Exercise packet provided.       Pertinent Vitals/Pain Pain Assessment: Faces Faces Pain Scale: Hurts little more Pain Location: R knee Pain Descriptors / Indicators: Grimacing Pain Intervention(s): Monitored during session    Home Living                      Prior Function            PT Goals (current goals can now be found in the care plan section) Acute Rehab PT Goals Patient Stated Goal: Go to rehab  PT Goal Formulation: With patient Time For Goal Achievement: 09/25/17 Potential to Achieve Goals: Good Progress towards PT goals: Progressing toward goals    Frequency    BID      PT Plan      Co-evaluation              AM-PAC PT "6 Clicks" Daily Activity  Outcome Measure  Difficulty turning over in bed (including adjusting bedclothes, sheets and blankets)?: A Little Difficulty moving from lying on back to sitting on the side of the bed? : A Little Difficulty sitting down on and standing up from a chair with arms (e.g., wheelchair, bedside commode, etc,.)?:  A Little Help needed moving to and from a bed to chair (including a wheelchair)?: A Little Help needed walking in hospital room?: A Little Help needed climbing 3-5 steps with a railing? : A Lot 6 Click Score: 17    End of Session Equipment Utilized During Treatment: Gait belt Activity Tolerance: Patient tolerated treatment well Patient left: in bed Nurse Communication: Mobility status PT Visit Diagnosis: Muscle weakness (generalized) (M62.81);Pain;Other abnormalities of gait and mobility (R26.89) Pain - Right/Left: Right Pain - part of body: Knee     Time: 1322-1401 PT Time Calculation (min) (ACUTE ONLY): 39 min  Charges:  $Gait Training: 23-37 mins $Therapeutic Activity: 8-22 mins                    G Codes:       Ellamae Sia, PT, DPT Acute Rehabilitation Services     Willy Eddy 09/20/2017, 3:12 PM

## 2017-09-20 NOTE — Anesthesia Postprocedure Evaluation (Signed)
Anesthesia Post Note  Patient: Rooney Gladwin  Procedure(s) Performed: RIGHT TOTAL KNEE ARTHROPLASTY (Right Knee)     Patient location during evaluation: PACU Anesthesia Type: Regional, MAC and Spinal Level of consciousness: awake and alert Pain management: pain level controlled Vital Signs Assessment: post-procedure vital signs reviewed and stable Respiratory status: spontaneous breathing, nonlabored ventilation, respiratory function stable and patient connected to nasal cannula oxygen Cardiovascular status: stable and blood pressure returned to baseline Postop Assessment: no apparent nausea or vomiting and spinal receding Anesthetic complications: no    Last Vitals:  Vitals:   09/19/17 2100 09/20/17 0601  BP: (!) 135/54 (!) 112/41  Pulse: 73 71  Resp: 17 18  Temp: 36.7 C 36.7 C  SpO2: 96% 96%    Last Pain:  Vitals:   09/20/17 0601  TempSrc: Oral  PainSc:                  Jamarcus Laduke

## 2017-09-21 NOTE — Clinical Social Work Note (Signed)
CSW provided pt with list. CSW spoke with pt's daughter via telephone. Pt's daughter understanding of [facility] placement--agreeable. Daughter request a SNF list provided via email. CSW will provided b/o once available.   [place], [place]

## 2017-09-21 NOTE — Progress Notes (Signed)
Physical Therapy Treatment Patient Details Name: Rachel Crane MRN: [id number] DOB: [birth year] Today's Date: 09/21/2017    History of Present Illness Pt. is a 72 y.o. F with significant PMH of OSA on CPAP and HTN admitted s/p R TKA.     PT Comments    Patient is progressing well toward mobility goals. Pt encouraged to increase time OOB and to ambulate to bathroom with nursing staff throughout the day. Continue to progress as tolerated.   Follow Up Recommendations  Follow surgeon's recommendation for DC plan and follow-up therapies     Equipment Recommendations  Rolling walker with 5" wheels;3in1 (PT)    Recommendations for Other Services       Precautions / Restrictions Precautions Precautions: Fall Restrictions Weight Bearing Restrictions: Yes RLE Weight Bearing: Weight bearing as tolerated    Mobility  Bed Mobility Overal bed mobility: Modified Independent Bed Mobility: Supine to Sit           General bed mobility comments: increased effort; HOB elevated  Transfers Overall transfer level: Needs assistance Equipment used: Rolling walker (2 wheeled) Transfers: Sit to/from Stand Sit to Stand: Min guard         General transfer comment: min guard for safety; pt impulsive to stand without RW nearby   Ambulation/Gait Ambulation/Gait assistance: Min guard Ambulation Distance (Feet): (hallway) Assistive device: Rolling walker (2 wheeled) Gait Pattern/deviations: Step-through pattern;Decreased stance time - right;Decreased step length - left;Decreased weight shift to right Gait velocity: decreased   General Gait Details: cues for R heel strike and sequencing   Stairs            Wheelchair Mobility    Modified Rankin (Stroke Patients Only)       Balance Overall balance assessment: Needs assistance Sitting-balance support: No upper extremity supported;Feet supported Sitting balance-Leahy Scale: Good     Standing balance support: No upper extremity  supported Standing balance-Leahy Scale: Fair                              Cognition Arousal/Alertness: Awake/alert   Overall Cognitive Status: No family/caregiver present to determine baseline cognitive functioning                                 General Comments: Patient with some confusion, stating a man from physical therapy came in earlier, which was inaccurate.       Exercises Total Joint Exercises Quad Sets: AROM;Right;10 reps Heel Slides: Right;AROM;10 reps Long Arc Quad: AAROM;Right;10 reps;Seated    General Comments General comments (skin integrity, edema, etc.): pt encouraged to continue working on HEP and to sit up OOB more often during the day      Pertinent Vitals/Pain Pain Assessment: Faces Faces Pain Scale: Hurts little more Pain Location: R knee Pain Descriptors / Indicators: Guarding Pain Intervention(s): Limited activity within patient's tolerance;Monitored during session;Premedicated before session;Repositioned;Ice applied    Home Living                      Prior Function            PT Goals (current goals can now be found in the care plan section) Acute Rehab PT Goals Patient Stated Goal: Go to rehab PT Goal Formulation: With patient Time For Goal Achievement: 09/25/17 Potential to Achieve Goals: Good Progress towards PT goals: Progressing toward goals    Frequency  7X/week      PT Plan Current plan remains appropriate    Co-evaluation              AM-PAC PT "6 Clicks" Daily Activity  Outcome Measure  Difficulty turning over in bed (including adjusting bedclothes, sheets and blankets)?: A Little Difficulty moving from lying on back to sitting on the side of the bed? : A Little Difficulty sitting down on and standing up from a chair with arms (e.g., wheelchair, bedside commode, etc,.)?: Unable Help needed moving to and from a bed to chair (including a wheelchair)?: A Little Help needed  walking in hospital room?: A Little Help needed climbing 3-5 steps with a railing? : A Lot 6 Click Score: 15    End of Session Equipment Utilized During Treatment: Gait belt Activity Tolerance: Patient tolerated treatment well Patient left: in chair;with call bell/phone within reach;Other (comment)(R LE in extension) Nurse Communication: Mobility status PT Visit Diagnosis: Muscle weakness (generalized) (M62.81);Pain;Other abnormalities of gait and mobility (R26.89) Pain - Right/Left: Right Pain - part of body: Knee     Time: 2458-0998 PT Time Calculation (min) (ACUTE ONLY): 33 min  Charges:  $Gait Training: 8-22 mins $Therapeutic Exercise: 8-22 mins                    G Codes:       Earney Navy, PTA Pager: [phone]     Darliss Cheney 09/21/2017, 2:31 PM

## 2017-09-21 NOTE — Clinical Social Work Note (Signed)
Clinical Social Work Assessment  Patient Details  Name: Rachel Crane MRN: [id number] Date of Birth: Dec 08, 1945  Date of referral:  09/21/17               Reason for consult:  Facility Placement                Permission sought to share information with:  Family Supports Permission granted to share information::  Yes, Verbal Permission Granted  Name::     Biochemist, clinical::     Relationship::  Daughter  Contact Information:     Housing/Transportation Living arrangements for the past 2 months:  Single Family Home Source of Information:  Patient Patient Interpreter Needed:  None Criminal Activity/Legal Involvement Pertinent to Current Situation/Hospitalization:  No - Comment as needed Significant Relationships:  Adult Children Lives with:  Self Do you feel safe going back to the place where you live?    Need for family participation in patient care:     Care giving concerns:  CSW met with pt at bedside. Pt is alert and orriented. Pt lived at home independently prior to admission.   Social Worker assessment / plan:  CSW spoke with pt at bedside. Pt is agreeable to SNF at d/c. Pt states her daughter wants her to be placed in [place] so they can check on her but pt states she wants to be placed in [place], so she isn't "intruding" on daughters life. Pt asked that CSW contact daughter and update her. Pt is agreeable to [facility] f/o. Pt ask for a list to look over. CSW to follow up with b/o. Pt does prefer a private room as long as insurance covers it. If not pt is agreeable to a semi-private.  Employment status:  Part-Time Forensic scientist:  Medicare PT Recommendations:  [place] / Referral to community resources:  [place]  Patient/Family's Response to care:  Pt verbalized understanding of CSW role and expressed appreciation for support. Pt denies any concern regarding pt care at this time.   Patient/Family's Understanding of and  Emotional Response to Diagnosis, Current Treatment, and Prognosis:  Pt understanding and realistic regarding physical limitations. Pt understands the need for SNF placement at d/c. Pt agreeable to SNF placement at d/c, at this time. Pt's responses emotionally appropriate during conversation with CSW. Pt denies any concern regarding treatment plan at this time. CSW will continue to provide support and facilitate d/c needs.   Emotional Assessment Appearance:  Appears stated age Attitude/Demeanor/Rapport:  (Patient was appropriate) Affect (typically observed):  Accepting, Appropriate, Calm Orientation:  Oriented to  Time, Oriented to Situation, Oriented to Place, Oriented to Self Alcohol / Substance use:  Not Applicable Psych involvement (Current and /or in the community):  No (Comment)  Discharge Needs  Concerns to be addressed:  Care Coordination, Basic Needs Readmission within the last 30 days:  No Current discharge risk:  Dependent with Mobility Barriers to Discharge:  Continued Medical Work up   [employer], LCSW 09/21/2017, 2:34 PM

## 2017-09-21 NOTE — Progress Notes (Signed)
Pt requested new mask for NIV.  Noted previous mask missing parts.  Replaced mask and assured correct operation.  Pt using at this time.

## 2017-09-21 NOTE — Progress Notes (Signed)
Subjective: 2 Days Post-Op Procedure(s) (LRB): RIGHT TOTAL KNEE ARTHROPLASTY (Right) Patient reports pain as severe.  States knee pain worse today. No chest pain , SOB nausea or vomiting.   Objective: Vital signs in last 24 hours: Temp:  [98.3 F (36.8 C)-100.6 F (38.1 C)] 98.3 F (36.8 C) (03/09 0440) Pulse Rate:  [81-90] 90 (03/09 0440) Resp:  [17] 17 (03/08 1300) BP: (128-135)/(42-56) 135/56 (03/09 0440) SpO2:  [98 %] 98 % (03/09 0440)  Intake/Output from previous day: 03/08 0701 - 03/09 0700 In: 720 [P.O.:720] Out: 1400 [Urine:1400] Intake/Output this shift: Total I/O In: 240 [P.O.:240] Out: 400 [Urine:400]  Recent Labs    09/20/17 0445  HGB 9.9*   Recent Labs    09/20/17 0445  WBC 9.2  RBC 3.67*  HCT 30.7*  PLT 157   Recent Labs    09/20/17 0445  NA 137  K 4.0  CL 102  CO2 25  BUN 12  CREATININE 1.06*  GLUCOSE 109*  CALCIUM 8.3*   No results for input(s): LABPT, INR in the last 72 hours.  Right lower leg: Sensation intact distally Intact pulses distally Dorsiflexion/Plantar flexion intact Incision: scant drainage Compartment soft  Assessment/Plan: 2 Days Post-Op Procedure(s) (LRB): RIGHT TOTAL KNEE ARTHROPLASTY (Right) Up with therapy Discharge to SNF when bed available.   Rachel Crane 09/21/2017, 12:27 PM

## 2017-09-22 ENCOUNTER — Encounter (HOSPITAL_COMMUNITY): Payer: Self-pay | Admitting: Orthopaedic Surgery

## 2017-09-22 MED ORDER — BUPIVACAINE IN DEXTROSE 0.75-8.25 % IT SOLN
INTRATHECAL | Status: DC | PRN
  Administered 2017-09-19: 2 mL via INTRATHECAL

## 2017-09-22 MED ORDER — ASPIRIN EC 325 MG PO TBEC
325.0000 mg | DELAYED_RELEASE_TABLET | Freq: Once | ORAL | Status: AC
  Administered 2017-09-23: 325 mg via ORAL
  Filled 2017-09-22: qty 1

## 2017-09-22 NOTE — Progress Notes (Signed)
Physical Therapy Treatment Patient Details Name: Rachel Crane MRN: [id number] DOB: 13-Jan-1946 Today's Date: 09/22/2017    History of Present Illness Pt. is a 72 y.o. F with significant PMH of OSA on CPAP and HTN admitted s/p R TKA.     PT Comments    Pt progressing well towards all goals however remains impulsive and was even walking in hallway alone. Pt educated on HEP. Acute PT to con't to follow.   Follow Up Recommendations  Follow surgeon's recommendation for DC plan and follow-up therapies     Equipment Recommendations  Rolling walker with 5" wheels;3in1 (PT)    Recommendations for Other Services       Precautions / Restrictions Precautions Precautions: Fall Restrictions Weight Bearing Restrictions: Yes RLE Weight Bearing: Weight bearing as tolerated    Mobility  Bed Mobility               General bed mobility comments: pt up in chair upon PT arrival  Transfers Overall transfer level: Needs assistance Equipment used: Rolling walker (2 wheeled) Transfers: Sit to/from Stand Sit to Stand: Supervision         General transfer comment: v/c's for safety and to push up from arm rests  Ambulation/Gait Ambulation/Gait assistance: Min assist Ambulation Distance (Feet): 300 Feet Assistive device: Standard walker Gait Pattern/deviations: Step-through pattern;Decreased stride length Gait velocity: slow Gait velocity interpretation: Below normal speed for age/gender General Gait Details: minA to guide RW con't forward to promote step through gait pattern instead of step to gait pattern. emphasis on heel toe gait pattern and knee extension during stand phase and bending during swing phase   Stairs            Wheelchair Mobility    Modified Rankin (Stroke Patients Only)       Balance Overall balance assessment: Needs assistance Sitting-balance support: No upper extremity supported;Feet supported Sitting balance-Leahy Scale: Good     Standing balance  support: No upper extremity supported Standing balance-Leahy Scale: Fair                              Cognition Arousal/Alertness: Awake/alert Behavior During Therapy: Impulsive Overall Cognitive Status: No family/caregiver present to determine baseline cognitive functioning                                 General Comments: unsure of baseline cognition, pt impulsive but also appears set in her ways      Exercises Total Joint Exercises Quad Sets: AROM;Right;10 reps Heel Slides: Right;AROM;10 reps(with foot on wash clothe) Long Arc Quad: AAROM;Right;10 reps;Seated Goniometric ROM: 8-90 deg in sitting, R knee flexion    General Comments        Pertinent Vitals/Pain Pain Assessment: Faces Faces Pain Scale: Hurts little more Pain Location: R knee Pain Descriptors / Indicators: Grimacing(with exercises and ROM) Pain Intervention(s): Monitored during session    Home Living                      Prior Function            PT Goals (current goals can now be found in the care plan section) Progress towards PT goals: Progressing toward goals    Frequency    7X/week      PT Plan Current plan remains appropriate    Co-evaluation  AM-PAC PT "6 Clicks" Daily Activity  Outcome Measure  Difficulty turning over in bed (including adjusting bedclothes, sheets and blankets)?: A Little Difficulty moving from lying on back to sitting on the side of the bed? : A Little Difficulty sitting down on and standing up from a chair with arms (e.g., wheelchair, bedside commode, etc,.)?: A Little Help needed moving to and from a bed to chair (including a wheelchair)?: A Little Help needed walking in hospital room?: A Little Help needed climbing 3-5 steps with a railing? : A Lot 6 Click Score: 17    End of Session Equipment Utilized During Treatment: Gait belt Activity Tolerance: Patient tolerated treatment well Patient left: in  chair;with call bell/phone within reach;Other (comment) Nurse Communication: Mobility status PT Visit Diagnosis: Muscle weakness (generalized) (M62.81);Pain;Other abnormalities of gait and mobility (R26.89) Pain - Right/Left: Right Pain - part of body: Knee     Time: 8088-1103 PT Time Calculation (min) (ACUTE ONLY): 24 min  Charges:  $Gait Training: 8-22 mins $Therapeutic Exercise: 8-22 mins                    G Codes:       Kittie Plater, PT, DPT Pager #: [phone] Office #: [phone]    [place] 09/22/2017, 1:06 PM

## 2017-09-22 NOTE — Addendum Note (Signed)
Addendum  created 09/22/17 2034 by Barnet Glasgow, MD   Child order released for a procedure order, Intraprocedure Blocks edited, Intraprocedure Meds edited, Sign clinical note

## 2017-09-22 NOTE — Progress Notes (Signed)
Ortho PA changed her right knee dressing this morning. When I went into assess her she had some blood saturated on the Aquacel Ag dressing so I marked it. This afternoon it started bleeding again and it oozed out of the Aquacel dressing onto her pajama pants. I removed the dressing and due to being so saturated it removed all but 3 sterile strips. I spoke with Dr. Erlinda Hong he said to redress it with abd pad, kerlex and wrap it with an ace wrap. Also to change her ASA 325mg  from BID to once daily.

## 2017-09-22 NOTE — Clinical Social Work Note (Signed)
CSW spoke with pt and pt's daughter. Pt has chosen [facility]. Pt will d/c 3/11 to [place]. Pt will be transported by [facility]. Pt and pt's daughter updated and agreeable. Facility prepared.  [place], [place]

## 2017-09-22 NOTE — Anesthesia Procedure Notes (Signed)
Spinal  Patient location during procedure: OR Start time: 09/19/2017 12:43 PM End time: 09/19/2017 12:50 PM Staffing Anesthesiologist: Barnet Glasgow, MD Performed: anesthesiologist  Preanesthetic Checklist Completed: patient identified, surgical consent, pre-op evaluation, timeout performed, risks and benefits discussed and monitors and equipment checked Spinal Block Patient position: sitting Location: L2-3 Needle Needle type: Pencan  Needle gauge: 24 G

## 2017-09-22 NOTE — Progress Notes (Signed)
Subjective: 3 Days Post-Op Procedure(s) (LRB): RIGHT TOTAL KNEE ARTHROPLASTY (Right) Patient reports pain as moderate.  Out walking in hallway this AM says she feels better today.   Objective: Vital signs in last 24 hours: Temp:  [98.5 F (36.9 C)-100.2 F (37.9 C)] 98.5 F (36.9 C) (03/10 0734) Pulse Rate:  [81-92] 91 (03/10 0734) Resp:  [15-16] 16 (03/10 0734) BP: (152-164)/(54-78) 164/64 (03/10 0734) SpO2:  [92 %-95 %] 94 % (03/10 0734)  Intake/Output from previous day: 03/09 0701 - 03/10 0700 In: 720 [P.O.:720] Out: 2900 [Urine:2900] Intake/Output this shift: No intake/output data recorded.  Recent Labs    09/20/17 0445  HGB 9.9*   Recent Labs    09/20/17 0445  WBC 9.2  RBC 3.67*  HCT 30.7*  PLT 157   Recent Labs    09/20/17 0445  NA 137  K 4.0  CL 102  CO2 25  BUN 12  CREATININE 1.06*  GLUCOSE 109*  CALCIUM 8.3*   No results for input(s): LABPT, INR in the last 72 hours.  Dorsiflexion/Plantar flexion intact Incision: scant drainage Compartment soft Wound benign well approximated.   Assessment/Plan: 3 Days Post-Op Procedure(s) (LRB): RIGHT TOTAL KNEE ARTHROPLASTY (Right) Up with therapy  SNF on Monday Dressing was coming off therefore it was changed today.   Rachel Crane 09/22/2017, 9:25 AM

## 2017-09-23 DIAGNOSIS — M6281 Muscle weakness (generalized): Secondary | ICD-10-CM | POA: Diagnosis not present

## 2017-09-23 DIAGNOSIS — R41841 Cognitive communication deficit: Secondary | ICD-10-CM | POA: Diagnosis not present

## 2017-09-23 DIAGNOSIS — R488 Other symbolic dysfunctions: Secondary | ICD-10-CM | POA: Diagnosis not present

## 2017-09-23 DIAGNOSIS — G8911 Acute pain due to trauma: Secondary | ICD-10-CM | POA: Diagnosis not present

## 2017-09-23 DIAGNOSIS — Z471 Aftercare following joint replacement surgery: Secondary | ICD-10-CM | POA: Diagnosis not present

## 2017-09-23 DIAGNOSIS — I10 Essential (primary) hypertension: Secondary | ICD-10-CM | POA: Diagnosis not present

## 2017-09-23 DIAGNOSIS — M25561 Pain in right knee: Secondary | ICD-10-CM | POA: Diagnosis not present

## 2017-09-23 DIAGNOSIS — S8002XA Contusion of left knee, initial encounter: Secondary | ICD-10-CM | POA: Diagnosis not present

## 2017-09-23 DIAGNOSIS — R2689 Other abnormalities of gait and mobility: Secondary | ICD-10-CM | POA: Diagnosis not present

## 2017-09-23 DIAGNOSIS — E785 Hyperlipidemia, unspecified: Secondary | ICD-10-CM | POA: Diagnosis not present

## 2017-09-23 DIAGNOSIS — Z96651 Presence of right artificial knee joint: Secondary | ICD-10-CM | POA: Diagnosis not present

## 2017-09-23 DIAGNOSIS — G4733 Obstructive sleep apnea (adult) (pediatric): Secondary | ICD-10-CM | POA: Diagnosis not present

## 2017-09-23 NOTE — Care Management Important Message (Signed)
Important Message  Patient Details  Name: Rachel Crane MRN: [id number] Date of Birth: 11-26-1945   Medicare Important Message Given:  Yes    Iven Earnhart Montine Circle 09/23/2017, 1:04 PM

## 2017-09-23 NOTE — Social Work (Signed)
Clinical Social Worker facilitated patient discharge including contacting patient family and facility to confirm patient discharge plans.  Clinical information faxed to facility and family agreeable with plan.    CSW arranged ambulance transport via [facility] to [place].    RN to call [phone] to give report prior to discharge. Pt going to Room 602P.  Clinical Social Worker will sign off for now as social work intervention is no longer needed. Please consult [place] again if new need arises.  Elissa Hefty, LCSW Clinical Social Worker [phone]

## 2017-09-23 NOTE — Discharge Summary (Signed)
Patient ID: Rachel Crane MRN: [id number] DOB/AGE: 1946-03-11 72 y.o.  Admit date: 09/19/2017 Discharge date: 09/23/2017  Admission Diagnoses:  Active Problems:   Total knee replacement status   Discharge Diagnoses:  Same  Past Medical History:  Diagnosis Date  . Arthritis   . Headache    H/O bad HA, since using CPAP- she has had improvement with that issue   . Hypertension   . OSA on CPAP    settting - 8, uses CPAP q night     Surgeries: Procedure(s): RIGHT TOTAL KNEE ARTHROPLASTY on 09/19/2017   Consultants:   Discharged Condition: Improved  Hospital Course: Louanna Vanliew is an 72 y.o. female who was admitted 09/19/2017 for operative treatment of<principal problem not specified>. Patient has severe unremitting pain that affects sleep, daily activities, and work/hobbies. After pre-op clearance the patient was taken to the operating room on 09/19/2017 and underwent  Procedure(s): RIGHT TOTAL KNEE ARTHROPLASTY.    Patient was given perioperative antibiotics:  Anti-infectives (From admission, onward)   Start     Dose/Rate Route Frequency Ordered Stop   09/19/17 1615  ceFAZolin (ANCEF) IVPB 2g/100 mL premix     2 g 200 mL/hr over 30 Minutes Intravenous Every 6 hours 09/19/17 1607 09/20/17 0555   09/19/17 1100  ceFAZolin (ANCEF) IVPB 2g/100 mL premix     2 g 200 mL/hr over 30 Minutes Intravenous To [facility]Stay [facility] 09/18/17 0957 09/19/17 1305       Patient was given sequential compression devices, early ambulation, and chemoprophylaxis to prevent DVT.  Patient benefited maximally from hospital stay and there were no complications.    Recent vital signs:  Patient Vitals for the past 24 hrs:  BP Temp Temp src Pulse Resp SpO2  09/23/17 0512 (!) 125/58 - - 96 16 94 %  09/22/17 2235 - - - 82 18 94 %  09/22/17 2123 (!) 145/65 99 F (37.2 C) - 85 16 93 %  09/22/17 1641 (!) 129/55 98.9 F (37.2 C) Oral 80 18 96 %     Recent laboratory studies: No results for input(s): WBC, HGB,  HCT, PLT, NA, K, CL, CO2, BUN, CREATININE, GLUCOSE, INR, CALCIUM in the last 72 hours.  Invalid input(s): PT, 2   Discharge Medications:   Allergies as of 09/23/2017   No Known Allergies     Medication List    STOP taking these medications   HYDROcodone-Acetaminophen 2.5-325 MG Tabs   potassium chloride 10 MEQ tablet Commonly known as:  K-DUR,KLOR-CON     TAKE these medications   aspirin EC 325 MG tablet Take 1 tablet (325 mg total) by mouth 2 (two) times daily.   azelastine 0.1 % nasal spray Commonly known as:  ASTELIN Place 2 sprays into both nostrils 2 (two) times daily. What changed:    when to take this  reasons to take this   chlorthalidone 25 MG tablet Commonly known as:  HYGROTON Take 0.5 tablets (12.5 mg total) daily by mouth.   cholecalciferol 1000 units tablet Commonly known as:  VITAMIN D Take 2,000 Units by mouth daily.   losartan 100 MG tablet Commonly known as:  COZAAR Take 1 tablet (100 mg total) by mouth daily.   lovastatin 20 MG tablet Commonly known as:  MEVACOR Take 1 tablet (20 mg total) by mouth at bedtime.   ondansetron 4 MG tablet Commonly known as:  ZOFRAN Take 1-2 tablets (4-8 mg total) by mouth every 8 (eight) hours as needed for nausea or vomiting.   oxyCODONE  10 mg 12 hr tablet Commonly known as:  OXYCONTIN Take 1 tablet (10 mg total) by mouth every 12 (twelve) hours.   oxyCODONE 5 MG immediate release tablet Commonly known as:  Oxy IR/ROXICODONE Take 1-3 tablets (5-15 mg total) by mouth every 4 (four) hours as needed.   potassium chloride 10 MEQ tablet Commonly known as:  K-DUR Take 1 tablet (10 mEq total) by mouth 2 (two) times daily for 14 days.   promethazine 25 MG tablet Commonly known as:  PHENERGAN Take 1 tablet (25 mg total) by mouth every 6 (six) hours as needed for nausea.   senna-docusate 8.6-50 MG tablet Commonly known as:  SENOKOT S Take 1 tablet by mouth at bedtime as needed.   tiZANidine 4 MG  tablet Commonly known as:  ZANAFLEX TAKE 1 TABLET BY MOUTH EVERY 6 HOURS AS NEEDED FOR MUSCLE SPASMS            Durable Medical Equipment  (From admission, onward)        Start     Ordered   09/19/17 1607  DME Walker rolling  Once    Question:  Patient needs a walker to treat with the following condition  Answer:  Total knee replacement status   09/19/17 1607   09/19/17 1607  DME 3 n 1  Once     09/19/17 1607   09/19/17 1607  DME Bedside commode  Once    Question:  Patient needs a bedside commode to treat with the following condition  Answer:  Total knee replacement status   09/19/17 1607      Diagnostic Studies: Dg Knee Right Port  Result Date: 09/19/2017 CLINICAL DATA:  Total knee replacement status EXAM: PORTABLE RIGHT KNEE - 1-2 VIEW COMPARISON:  08/09/2017 FINDINGS: Status post total knee arthroplasty. The hardware appears well seated. Postoperative gas identified in the soft tissues and joint space. IMPRESSION: Status post total knee arthroplasty.  No adverse features. Electronically Signed   By: Nolon Nations M.D.   On: 09/19/2017 16:28    Disposition: 01-Home or Self Care     Contact information for follow-up providers    Leandrew Koyanagi, MD Follow up in 2 week(s).   Specialty:  Orthopedic Surgery Why:  For wound re-check, For suture removal Contact information: [address] [place] [address] [phone]            Contact information for after-discharge care    Destination    [facility] Follow up.   Service:  Skilled Nursing Contact information: [place] [phone]                   Signed: Aundra Dubin 09/23/2017, 7:46 AM

## 2017-09-23 NOTE — Progress Notes (Signed)
Physical Therapy Treatment Patient Details Name: Rachel Crane MRN: [id number] DOB: 01/19/1946 Today's Date: 09/23/2017    History of Present Illness Pt. is a 72 y.o. F with significant PMH of OSA on CPAP and HTN admitted s/p R TKA.     PT Comments    Patient with decreased impulsiveness this session with functional mobility but still needs frequent VC's for safety with set up for transfers. For example, patient tends to try to perform sit to stand transfers with right leg extended and abducted. Overall, patient is continuing to progress towards goals with increased distance ambulated this session and  increased gait speed. Will benefit from continued safety cueing, strengthening, range of motion exercises, and gait training.    Follow Up Recommendations  Follow surgeon's recommendation for DC plan and follow-up therapies     Equipment Recommendations  Rolling walker with 5" wheels;3in1 (PT)    Recommendations for Other Services       Precautions / Restrictions Precautions Precautions: Fall Restrictions Weight Bearing Restrictions: Yes RLE Weight Bearing: Weight bearing as tolerated    Mobility  Bed Mobility Overal bed mobility: Modified Independent Bed Mobility: Supine to Sit     Supine to sit: Modified independent (Device/Increase time)     General bed mobility comments: Patient modified independent with supine to sit with HOB flat. Increased time needed to move RLE.   Transfers Overall transfer level: Needs assistance Equipment used: Rolling walker (2 wheeled) Transfers: Sit to/from Stand Sit to Stand: Supervision;Min assist         General transfer comment: Patient needs mostly min guard for sit to stand, but one instance of requiring min assist from low surface. Needs max VC's for maintaining feet shoulder width apart and increased R knee flexion before attempting stand. Toilet transfer performed with supervision and RW.    Ambulation/Gait Ambulation/Gait  assistance: Min guard Ambulation Distance (Feet): 510 Feet Assistive device: Rolling walker (2 wheeled) Gait Pattern/deviations: Step-through pattern;Decreased stride length;Step-to pattern;Decreased step length - left;Decreased stance time - right;Decreased weight shift to right   Gait velocity interpretation: Below normal speed for age/gender General Gait Details: Patient requiring min guard and RW for safety during gait. Tends to deviate from straight path when distracted or doing head turns. VC's provided for RW proximity (pt too close to frame of RW) and R heel strike at initial contact. Patient able to progress to step through pattern and increased gait speed by end of ambulation. One seated rest break required.    Stairs            Wheelchair Mobility    Modified Rankin (Stroke Patients Only)       Balance Overall balance assessment: Needs assistance Sitting-balance support: No upper extremity supported;Feet supported Sitting balance-Leahy Scale: Good     Standing balance support: No upper extremity supported Standing balance-Leahy Scale: Fair                              Cognition Arousal/Alertness: Awake/alert   Overall Cognitive Status: No family/caregiver present to determine baseline cognitive functioning                                        Exercises Total Joint Exercises Heel Slides: Right;AROM;10 reps;Seated(with foot on wash clothe) Long Arc Quad: AAROM;Right;10 reps;Seated    General Comments  Pertinent Vitals/Pain Pain Assessment: Faces Faces Pain Scale: Hurts even more Pain Location: R knee Pain Descriptors / Indicators: Grimacing Pain Intervention(s): Monitored during session;Limited activity within patient's tolerance;Repositioned    Home Living                      Prior Function            PT Goals (current goals can now be found in the care plan section) Acute Rehab PT Goals Patient  Stated Goal: Go to rehab PT Goal Formulation: With patient Time For Goal Achievement: 09/25/17 Potential to Achieve Goals: Good Progress towards PT goals: Progressing toward goals    Frequency    7X/week      PT Plan Current plan remains appropriate    Co-evaluation              AM-PAC PT "6 Clicks" Daily Activity  Outcome Measure  Difficulty turning over in bed (including adjusting bedclothes, sheets and blankets)?: A Little Difficulty moving from lying on back to sitting on the side of the bed? : A Little Difficulty sitting down on and standing up from a chair with arms (e.g., wheelchair, bedside commode, etc,.)?: A Little Help needed moving to and from a bed to chair (including a wheelchair)?: A Little Help needed walking in hospital room?: A Little Help needed climbing 3-5 steps with a railing? : A Lot 6 Click Score: 17    End of Session Equipment Utilized During Treatment: Gait belt Activity Tolerance: Patient tolerated treatment well Patient left: in chair;with call bell/phone within reach;Other (comment);with chair alarm set Nurse Communication: Other (comment)(Patient with nausea at end of session) PT Visit Diagnosis: Muscle weakness (generalized) (M62.81);Pain;Other abnormalities of gait and mobility (R26.89) Pain - Right/Left: Right Pain - part of body: Knee     Time: 2025-4270 PT Time Calculation (min) (ACUTE ONLY): 38 min  Charges:  $Gait Training: 23-37 mins $Therapeutic Activity: 8-22 mins                    G Codes:       Ellamae Sia, PT, DPT Acute Rehabilitation Services    Willy Eddy 09/23/2017, 8:51 AM

## 2017-09-23 NOTE — Progress Notes (Signed)
Report has been called to [place] Loss adjuster, chartered).  Written discharge instructions along with prescriptions will be sent via [facility]. Patient is awaiting [facility].

## 2017-09-23 NOTE — Clinical Social Work Placement (Signed)
   CLINICAL SOCIAL WORK PLACEMENT  NOTE  Date:  09/23/2017  Patient Details  Name: Rachel Crane MRN: [id number] Date of Birth: [birth year]  Clinical Social Work is seeking post-discharge placement for this patient at the [place] level of care (*CSW will initial, date and re-position this form in  chart as items are completed):  Yes   Patient/family provided with [facility] Work Department's list of facilities offering this level of care within the geographic area requested by the patient (or if unable, by the patient's family).  Yes   Patient/family informed of their freedom to choose among providers that offer the needed level of care, that participate in Medicare, Medicaid or managed care program needed by the patient, have an available bed and are willing to accept the patient.  Yes   Patient/family informed of [facility]'s ownership interest in [facility] and [facility], as well as of the fact that they are under no obligation to receive care at these facilities.  PASRR submitted to EDS on       PASRR number received on 09/20/17     Existing PASRR number confirmed on       FL2 transmitted to all facilities in geographic area requested by pt/family on 09/20/17     FL2 transmitted to all facilities within larger geographic area on       Patient informed that his/her managed care company has contracts with or will negotiate with certain facilities, including the following:        Yes   Patient/family informed of bed offers received.  Patient chooses bed at       Physician recommends and patient chooses bed at [facility]    Patient to be transferred to [facility] on 09/23/17.  Patient to be transferred to facility by [facility]     Patient family notified on 09/23/17 of transfer.  Name of family member notified:  contacted daughter     PHYSICIAN       Additional Comment:     _______________________________________________ Normajean Baxter, LCSW 09/23/2017, 8:49 AM

## 2017-09-24 DIAGNOSIS — E785 Hyperlipidemia, unspecified: Secondary | ICD-10-CM | POA: Diagnosis not present

## 2017-09-24 DIAGNOSIS — I10 Essential (primary) hypertension: Secondary | ICD-10-CM | POA: Diagnosis not present

## 2017-09-24 DIAGNOSIS — G4733 Obstructive sleep apnea (adult) (pediatric): Secondary | ICD-10-CM | POA: Diagnosis not present

## 2017-09-24 DIAGNOSIS — Z96651 Presence of right artificial knee joint: Secondary | ICD-10-CM | POA: Diagnosis not present

## 2017-09-25 DIAGNOSIS — R2689 Other abnormalities of gait and mobility: Secondary | ICD-10-CM | POA: Diagnosis not present

## 2017-09-25 DIAGNOSIS — M25561 Pain in right knee: Secondary | ICD-10-CM | POA: Diagnosis not present

## 2017-09-27 DIAGNOSIS — M25561 Pain in right knee: Secondary | ICD-10-CM | POA: Diagnosis not present

## 2017-09-27 DIAGNOSIS — R2689 Other abnormalities of gait and mobility: Secondary | ICD-10-CM | POA: Diagnosis not present

## 2017-10-02 ENCOUNTER — Telehealth (INDEPENDENT_AMBULATORY_CARE_PROVIDER_SITE_OTHER): Payer: Self-pay | Admitting: Orthopaedic Surgery

## 2017-10-02 DIAGNOSIS — Z96651 Presence of right artificial knee joint: Secondary | ICD-10-CM | POA: Diagnosis not present

## 2017-10-02 DIAGNOSIS — E785 Hyperlipidemia, unspecified: Secondary | ICD-10-CM | POA: Diagnosis not present

## 2017-10-02 DIAGNOSIS — G4733 Obstructive sleep apnea (adult) (pediatric): Secondary | ICD-10-CM | POA: Diagnosis not present

## 2017-10-02 DIAGNOSIS — I10 Essential (primary) hypertension: Secondary | ICD-10-CM | POA: Diagnosis not present

## 2017-10-02 NOTE — Telephone Encounter (Signed)
See message below °

## 2017-10-02 NOTE — Telephone Encounter (Signed)
Patient called in regard to refills. She was just discharged from rehab and has an appt for tomorrow. (SENOKOT S) 8.6-50 MG tablet [[id number]]  tiZANidine (ZANAFLEX) 4 MG tablet [[id number]]   Please call this patient as I think she may be thinking [facility] wrote these scripts. I advised I didn't see any other than the ones in the system from 09/19/17.

## 2017-10-02 NOTE — Telephone Encounter (Signed)
yes

## 2017-10-03 ENCOUNTER — Ambulatory Visit (INDEPENDENT_AMBULATORY_CARE_PROVIDER_SITE_OTHER): Payer: Medicare Other | Admitting: Orthopaedic Surgery

## 2017-10-03 ENCOUNTER — Encounter (INDEPENDENT_AMBULATORY_CARE_PROVIDER_SITE_OTHER): Payer: Self-pay | Admitting: Orthopaedic Surgery

## 2017-10-03 DIAGNOSIS — M6281 Muscle weakness (generalized): Secondary | ICD-10-CM | POA: Diagnosis not present

## 2017-10-03 DIAGNOSIS — Z96651 Presence of right artificial knee joint: Secondary | ICD-10-CM | POA: Diagnosis not present

## 2017-10-03 DIAGNOSIS — I10 Essential (primary) hypertension: Secondary | ICD-10-CM | POA: Diagnosis not present

## 2017-10-03 DIAGNOSIS — G4733 Obstructive sleep apnea (adult) (pediatric): Secondary | ICD-10-CM | POA: Diagnosis not present

## 2017-10-03 DIAGNOSIS — R262 Difficulty in walking, not elsewhere classified: Secondary | ICD-10-CM | POA: Diagnosis not present

## 2017-10-03 DIAGNOSIS — Z471 Aftercare following joint replacement surgery: Secondary | ICD-10-CM | POA: Diagnosis not present

## 2017-10-03 DIAGNOSIS — M1711 Unilateral primary osteoarthritis, right knee: Secondary | ICD-10-CM

## 2017-10-03 NOTE — Progress Notes (Signed)
Patient is two-week status post right total knee replacement.  She is spent a week in a skilled nursing facility and now she is home.  She is doing well.  Her pain is well controlled.  She is improving.  Her incision is healed without any signs of infection or drainage.  She has the expected postoperative swelling.  Range of motion is 0-95 degrees.  Calf is nontender.  Referral for outpatient physical therapy was given today after she finishes home physical therapy.  Continue aspirin for DVT prophylaxis.  Follow-up in 4 weeks with three-view x-rays of the right knee.

## 2017-10-03 NOTE — Telephone Encounter (Signed)
Patient was seen today.

## 2017-10-04 DIAGNOSIS — G4733 Obstructive sleep apnea (adult) (pediatric): Secondary | ICD-10-CM | POA: Diagnosis not present

## 2017-10-04 DIAGNOSIS — Z471 Aftercare following joint replacement surgery: Secondary | ICD-10-CM | POA: Diagnosis not present

## 2017-10-04 DIAGNOSIS — M6281 Muscle weakness (generalized): Secondary | ICD-10-CM | POA: Diagnosis not present

## 2017-10-04 DIAGNOSIS — Z96651 Presence of right artificial knee joint: Secondary | ICD-10-CM | POA: Diagnosis not present

## 2017-10-04 DIAGNOSIS — I10 Essential (primary) hypertension: Secondary | ICD-10-CM | POA: Diagnosis not present

## 2017-10-04 DIAGNOSIS — R262 Difficulty in walking, not elsewhere classified: Secondary | ICD-10-CM | POA: Diagnosis not present

## 2017-10-08 DIAGNOSIS — G4733 Obstructive sleep apnea (adult) (pediatric): Secondary | ICD-10-CM | POA: Diagnosis not present

## 2017-10-08 DIAGNOSIS — M6281 Muscle weakness (generalized): Secondary | ICD-10-CM | POA: Diagnosis not present

## 2017-10-08 DIAGNOSIS — Z96651 Presence of right artificial knee joint: Secondary | ICD-10-CM | POA: Diagnosis not present

## 2017-10-08 DIAGNOSIS — R262 Difficulty in walking, not elsewhere classified: Secondary | ICD-10-CM | POA: Diagnosis not present

## 2017-10-08 DIAGNOSIS — I10 Essential (primary) hypertension: Secondary | ICD-10-CM | POA: Diagnosis not present

## 2017-10-08 DIAGNOSIS — Z471 Aftercare following joint replacement surgery: Secondary | ICD-10-CM | POA: Diagnosis not present

## 2017-10-09 DIAGNOSIS — R262 Difficulty in walking, not elsewhere classified: Secondary | ICD-10-CM | POA: Diagnosis not present

## 2017-10-09 DIAGNOSIS — Z96651 Presence of right artificial knee joint: Secondary | ICD-10-CM | POA: Diagnosis not present

## 2017-10-09 DIAGNOSIS — I10 Essential (primary) hypertension: Secondary | ICD-10-CM | POA: Diagnosis not present

## 2017-10-09 DIAGNOSIS — M6281 Muscle weakness (generalized): Secondary | ICD-10-CM | POA: Diagnosis not present

## 2017-10-09 DIAGNOSIS — Z471 Aftercare following joint replacement surgery: Secondary | ICD-10-CM | POA: Diagnosis not present

## 2017-10-09 DIAGNOSIS — G4733 Obstructive sleep apnea (adult) (pediatric): Secondary | ICD-10-CM | POA: Diagnosis not present

## 2017-10-15 DIAGNOSIS — Z96651 Presence of right artificial knee joint: Secondary | ICD-10-CM | POA: Diagnosis not present

## 2017-10-15 DIAGNOSIS — Z471 Aftercare following joint replacement surgery: Secondary | ICD-10-CM | POA: Diagnosis not present

## 2017-10-15 DIAGNOSIS — I10 Essential (primary) hypertension: Secondary | ICD-10-CM | POA: Diagnosis not present

## 2017-10-15 DIAGNOSIS — R262 Difficulty in walking, not elsewhere classified: Secondary | ICD-10-CM | POA: Diagnosis not present

## 2017-10-15 DIAGNOSIS — G4733 Obstructive sleep apnea (adult) (pediatric): Secondary | ICD-10-CM | POA: Diagnosis not present

## 2017-10-15 DIAGNOSIS — M6281 Muscle weakness (generalized): Secondary | ICD-10-CM | POA: Diagnosis not present

## 2017-10-17 ENCOUNTER — Other Ambulatory Visit: Payer: Self-pay

## 2017-10-17 ENCOUNTER — Ambulatory Visit (INDEPENDENT_AMBULATORY_CARE_PROVIDER_SITE_OTHER): Payer: Medicare Other | Admitting: Physician Assistant

## 2017-10-17 ENCOUNTER — Encounter (INDEPENDENT_AMBULATORY_CARE_PROVIDER_SITE_OTHER): Payer: Self-pay | Admitting: Physician Assistant

## 2017-10-17 VITALS — BP 114/69 | HR 70 | Temp 98.4°F | Ht 66.0 in | Wt 201.6 lb

## 2017-10-17 DIAGNOSIS — D649 Anemia, unspecified: Secondary | ICD-10-CM | POA: Diagnosis not present

## 2017-10-17 DIAGNOSIS — Z471 Aftercare following joint replacement surgery: Secondary | ICD-10-CM | POA: Diagnosis not present

## 2017-10-17 DIAGNOSIS — Z96651 Presence of right artificial knee joint: Secondary | ICD-10-CM | POA: Diagnosis not present

## 2017-10-17 DIAGNOSIS — G4733 Obstructive sleep apnea (adult) (pediatric): Secondary | ICD-10-CM | POA: Diagnosis not present

## 2017-10-17 DIAGNOSIS — I10 Essential (primary) hypertension: Secondary | ICD-10-CM

## 2017-10-17 DIAGNOSIS — R262 Difficulty in walking, not elsewhere classified: Secondary | ICD-10-CM | POA: Diagnosis not present

## 2017-10-17 DIAGNOSIS — M6281 Muscle weakness (generalized): Secondary | ICD-10-CM | POA: Diagnosis not present

## 2017-10-17 DIAGNOSIS — Z79899 Other long term (current) drug therapy: Secondary | ICD-10-CM | POA: Diagnosis not present

## 2017-10-17 DIAGNOSIS — R5383 Other fatigue: Secondary | ICD-10-CM

## 2017-10-17 MED ORDER — ACETAMINOPHEN-CODEINE #3 300-30 MG PO TABS: 1.0000 | ORAL_TABLET | Freq: Two times a day (BID) | ORAL | 0 refills | Status: AC

## 2017-10-17 MED ORDER — SENNOSIDES-DOCUSATE SODIUM 8.6-50 MG PO TABS: 1.0000 | ORAL_TABLET | Freq: Every evening | ORAL | 1 refills | Status: DC | PRN

## 2017-10-17 MED ORDER — ASPIRIN EC 325 MG PO TBEC: 325.0000 mg | DELAYED_RELEASE_TABLET | Freq: Two times a day (BID) | ORAL | 0 refills | Status: DC

## 2017-10-17 MED ORDER — MELOXICAM 7.5 MG PO TABS
7.5000 mg | ORAL_TABLET | Freq: Every day | ORAL | 0 refills | Status: DC
Start: 2017-10-17 — End: 2017-12-12

## 2017-10-17 NOTE — Patient Instructions (Signed)

## 2017-10-17 NOTE — Progress Notes (Signed)
Subjective:  Patient ID: Rachel Crane, female    DOB: 1945/11/11  Age: 72 y.o. MRN: [id number]  CC: f/u HTN and knee pain  HPI Rachel Crane a 72 y.o.femalewith a PMH of OSA and HTN presents on f/u of right total knee arthroplasty and HTN. Right knee surgery performed nearly one month ago. Has followed up with her surgeon as directed. Pt is healing well and has thus far participated in PT as directed. Pt complains of mild pain but feels she is doing well overall. She is expected to f/u with orthopedic surgeon in four days. Main complaint today is of fatigue. Of note, she was found to be anemic in hospital at Hgb 9.9 g/dL. Seems patient was transfused one unit of pRBC. Pt does not endorse any other symptoms or complaints.       Outpatient Medications Prior to Visit  Medication Sig Dispense Refill  . aspirin EC 325 MG tablet Take 1 tablet (325 mg total) by mouth 2 (two) times daily. 84 tablet 0  . losartan (COZAAR) 100 MG tablet Take 1 tablet (100 mg total) by mouth daily. 90 tablet 3  . lovastatin (MEVACOR) 20 MG tablet Take 1 tablet (20 mg total) by mouth at bedtime. 90 tablet 3  . cholecalciferol (VITAMIN D) 1000 units tablet Take 2,000 Units by mouth daily.     Marland Kitchen oxyCODONE (OXY IR/ROXICODONE) 5 MG immediate release tablet Take 1-3 tablets (5-15 mg total) by mouth every 4 (four) hours as needed. (Patient not taking: Reported on 10/17/2017) 30 tablet 0  . oxyCODONE (OXYCONTIN) 10 mg 12 hr tablet Take 1 tablet (10 mg total) by mouth every 12 (twelve) hours. (Patient not taking: Reported on 10/03/2017) 10 tablet 0  . potassium chloride (K-DUR) 10 MEQ tablet Take 1 tablet (10 mEq total) by mouth 2 (two) times daily for 14 days. 28 tablet 0  . senna-docusate (SENOKOT S) 8.6-50 MG tablet Take 1 tablet by mouth at bedtime as needed. (Patient not taking: Reported on 10/03/2017) 30 tablet 1  . azelastine (ASTELIN) 0.1 % nasal spray Place 2 sprays into both nostrils 2 (two) times daily. (Patient taking  differently: Place 2 sprays into both nostrils 2 (two) times daily as needed. ) 30 mL 5  . chlorthalidone (HYGROTON) 25 MG tablet Take 0.5 tablets (12.5 mg total) daily by mouth. 45 tablet 1  . mupirocin ointment (BACTROBAN) 2 % APPLY TO BOTH NOSTRILS BID.  0  . ondansetron (ZOFRAN) 4 MG tablet Take 1-2 tablets (4-8 mg total) by mouth every 8 (eight) hours as needed for nausea or vomiting. 40 tablet 0  . promethazine (PHENERGAN) 25 MG tablet Take 1 tablet (25 mg total) by mouth every 6 (six) hours as needed for nausea. (Patient not taking: Reported on 10/03/2017) 30 tablet 1  . tiZANidine (ZANAFLEX) 4 MG tablet TAKE 1 TABLET BY MOUTH EVERY 6 HOURS AS NEEDED FOR MUSCLE SPASMS 385 tablet 2   No facility-administered medications prior to visit.      ROS Review of Systems  Constitutional: Positive for malaise/fatigue. Negative for chills and fever.  Eyes: Negative for blurred vision.  Respiratory: Negative for shortness of breath.   Cardiovascular: Negative for chest pain and palpitations.  Gastrointestinal: Negative for abdominal pain and nausea.  Genitourinary: Negative for dysuria and hematuria.  Musculoskeletal: Positive for joint pain. Negative for myalgias.  Skin: Negative for rash.  Neurological: Negative for tingling and headaches.  Psychiatric/Behavioral: Negative for depression. The patient is not nervous/anxious.  Objective:  BP 114/69 (BP Location: Left Arm, Patient Position: Sitting, Cuff Size: Large)   Pulse 70   Temp 98.4 F (36.9 C) (Oral)   Ht 5\' 6"  (1.676 m)   Wt 201 lb 9.6 oz (91.4 kg)   SpO2 97%   BMI 32.54 kg/m   BP/Weight 10/17/2017 0/76/2263 09/16/5454  Systolic BP 256 389 -  Diastolic BP 69 58 -  Wt. (Lbs) 201.6 - 205.6  BMI 32.54 - 33.18      Physical Exam  Constitutional: She is oriented to person, place, and time.  Well developed, well nourished, NAD, polite  HENT:  Head: Normocephalic and atraumatic.  Eyes: No scleral icterus.  Neck: Normal  range of motion. Neck supple. No thyromegaly present.  Cardiovascular: Normal rate, regular rhythm and normal heart sounds.  Pulmonary/Chest: Effort normal and breath sounds normal. No respiratory distress. She has no wheezes. She has no rales.  Musculoskeletal: She exhibits no edema.  Full aROM of right knee  Neurological: She is alert and oriented to person, place, and time.  Skin: Skin is warm and dry. No rash noted. No erythema. No pallor.  Well healed surgical incision over the right knee. No erythema, edema, or ecchymosis  Psychiatric: She has a normal mood and affect. Her behavior is normal. Thought content normal.  Vitals reviewed.    Assessment & Plan:    1. History of total right knee replacement - DG Knee 3 Views Right; Future - aspirin EC 325 MG tablet; Take 1 tablet (325 mg total) by mouth 2 (two) times daily.  Dispense: 84 tablet; Refill: 0 - acetaminophen-codeine (TYLENOL #3) 300-30 MG tablet; Take 1 tablet by mouth every 12 (twelve) hours for 7 days.  Dispense: 14 tablet; Refill: 0 - meloxicam (MOBIC) 7.5 MG tablet; Take 1 tablet (7.5 mg total) by mouth daily.  Dispense: 15 tablet; Refill: 0  2. Hypertension, unspecified type - Continue on current anti-hypertensive therapy  3. Fatigue, unspecified type - Basic Metabolic Panel - CBC with Differential  4. Anemia, unspecified type - CBC with Differential   5. High risk medication use - senna-docusate (SENOKOT S) 8.6-50 MG tablet; Take 1 tablet by mouth at bedtime as needed.  Dispense: 30 tablet; Refill: 1   Meds ordered this encounter  Medications  . senna-docusate (SENOKOT S) 8.6-50 MG tablet    Sig: Take 1 tablet by mouth at bedtime as needed.    Dispense:  30 tablet    Refill:  1    Order Specific Question:   Supervising Provider    Answer:   Tresa Garter [id number]  . aspirin EC 325 MG tablet    Sig: Take 1 tablet (325 mg total) by mouth 2 (two) times daily.    Dispense:  84 tablet    Refill:  0     Order Specific Question:   Supervising Provider    Answer:   Tresa Garter [id number]  . acetaminophen-codeine (TYLENOL #3) 300-30 MG tablet    Sig: Take 1 tablet by mouth every 12 (twelve) hours for 7 days.    Dispense:  14 tablet    Refill:  0    Order Specific Question:   Supervising Provider    Answer:   Tresa Garter [id number]  . meloxicam (MOBIC) 7.5 MG tablet    Sig: Take 1 tablet (7.5 mg total) by mouth daily.    Dispense:  15 tablet    Refill:  0    Order Specific Question:  Supervising Provider    Answer:   Tresa Garter [[id number]]    Follow-up: Return in about 4 months (around 02/16/2018) for HTN.   Clent Demark PA

## 2017-10-18 ENCOUNTER — Other Ambulatory Visit (INDEPENDENT_AMBULATORY_CARE_PROVIDER_SITE_OTHER): Payer: Self-pay | Admitting: Physician Assistant

## 2017-10-18 ENCOUNTER — Telehealth (INDEPENDENT_AMBULATORY_CARE_PROVIDER_SITE_OTHER): Payer: Self-pay | Admitting: Orthopaedic Surgery

## 2017-10-18 ENCOUNTER — Telehealth (INDEPENDENT_AMBULATORY_CARE_PROVIDER_SITE_OTHER): Payer: Self-pay

## 2017-10-18 DIAGNOSIS — E876 Hypokalemia: Secondary | ICD-10-CM

## 2017-10-18 LAB — CBC WITH DIFFERENTIAL/PLATELET
Basophils Absolute: 0 10*3/uL (ref 0.0–0.2)
Basos: 0 %
EOS (ABSOLUTE): 0.1 10*3/uL (ref 0.0–0.4)
Eos: 2 %
Hematocrit: 33.4 % — ABNORMAL LOW (ref 34.0–46.6)
Hemoglobin: 10.6 g/dL — ABNORMAL LOW (ref 11.1–15.9)
Immature Grans (Abs): 0 10*3/uL (ref 0.0–0.1)
Immature Granulocytes: 0 %
Lymphocytes Absolute: 3.3 10*3/uL — ABNORMAL HIGH (ref 0.7–3.1)
Lymphs: 44 %
MCH: 26.8 pg (ref 26.6–33.0)
MCHC: 31.7 g/dL (ref 31.5–35.7)
MCV: 84 fL (ref 79–97)
Monocytes Absolute: 0.4 10*3/uL (ref 0.1–0.9)
Monocytes: 6 %
Neutrophils Absolute: 3.6 10*3/uL (ref 1.4–7.0)
Neutrophils: 48 %
Platelets: 271 10*3/uL (ref 150–379)
RBC: 3.96 x10E6/uL (ref 3.77–5.28)
RDW: 15.5 % — ABNORMAL HIGH (ref 12.3–15.4)
WBC: 7.4 10*3/uL (ref 3.4–10.8)

## 2017-10-18 LAB — BASIC METABOLIC PANEL
BUN/Creatinine Ratio: 16 (ref 12–28)
BUN: 16 mg/dL (ref 8–27)
CO2: 27 mmol/L (ref 20–29)
Calcium: 9.6 mg/dL (ref 8.7–10.3)
Chloride: 97 mmol/L (ref 96–106)
Creatinine, Ser: 0.99 mg/dL (ref 0.57–1.00)
GFR calc Af Amer: 67 mL/min/{1.73_m2} (ref >59)
GFR calc non Af Amer: 58 mL/min/{1.73_m2} — ABNORMAL LOW (ref >59)
Glucose: 118 mg/dL — ABNORMAL HIGH (ref 65–99)
Potassium: 2.8 mmol/L — ABNORMAL LOW (ref 3.5–5.2)
Sodium: 141 mmol/L (ref 134–144)

## 2017-10-18 MED ORDER — POTASSIUM CHLORIDE ER 10 MEQ PO TBCR: 10.0000 meq | EXTENDED_RELEASE_TABLET | Freq: Two times a day (BID) | ORAL | 0 refills | Status: DC

## 2017-10-18 NOTE — Telephone Encounter (Signed)
Insurance provider  [employer]    Pt called she needed to know if the office received fax from the insurance company. Please call pt to discuss pt care.

## 2017-10-18 NOTE — Telephone Encounter (Signed)
-----   Message from Clent Demark, PA-C sent at 10/18/2017  1:09 PM EDT ----- Potassium is too low. Anemia is better. I will send potassium pills to [facility]. Will need to return in two weeks for potassium recheck.

## 2017-10-18 NOTE — Telephone Encounter (Signed)
Patient is aware of potassium being too low and potassium pills being sent to [facility], and to return in two weeks for a recheck of her potassium. Patient also aware that anemia is better. Patient is asking if she can take half of the fluid pill because it is causing her to urinate too much. Nat Christen, CMA

## 2017-10-21 DIAGNOSIS — R262 Difficulty in walking, not elsewhere classified: Secondary | ICD-10-CM | POA: Diagnosis not present

## 2017-10-21 DIAGNOSIS — M6281 Muscle weakness (generalized): Secondary | ICD-10-CM | POA: Diagnosis not present

## 2017-10-21 DIAGNOSIS — M25661 Stiffness of right knee, not elsewhere classified: Secondary | ICD-10-CM | POA: Diagnosis not present

## 2017-10-21 DIAGNOSIS — M25561 Pain in right knee: Secondary | ICD-10-CM | POA: Diagnosis not present

## 2017-10-21 NOTE — Telephone Encounter (Signed)
She can take 1/2 tab and we will see her on f/u for BP check.

## 2017-10-21 NOTE — Telephone Encounter (Signed)
Nope, I haven't received anything.

## 2017-10-21 NOTE — Telephone Encounter (Signed)
Did you receive any form from them?

## 2017-10-22 NOTE — Telephone Encounter (Signed)
Can you please call patient let her know he have not received paperwork. If she needs paper work filled out she will need to do it through [employer].

## 2017-10-23 DIAGNOSIS — R262 Difficulty in walking, not elsewhere classified: Secondary | ICD-10-CM | POA: Diagnosis not present

## 2017-10-23 DIAGNOSIS — M25561 Pain in right knee: Secondary | ICD-10-CM | POA: Diagnosis not present

## 2017-10-23 DIAGNOSIS — M25661 Stiffness of right knee, not elsewhere classified: Secondary | ICD-10-CM | POA: Diagnosis not present

## 2017-10-23 DIAGNOSIS — M6281 Muscle weakness (generalized): Secondary | ICD-10-CM | POA: Diagnosis not present

## 2017-10-25 DIAGNOSIS — R262 Difficulty in walking, not elsewhere classified: Secondary | ICD-10-CM | POA: Diagnosis not present

## 2017-10-25 DIAGNOSIS — M25661 Stiffness of right knee, not elsewhere classified: Secondary | ICD-10-CM | POA: Diagnosis not present

## 2017-10-25 DIAGNOSIS — M25561 Pain in right knee: Secondary | ICD-10-CM | POA: Diagnosis not present

## 2017-10-25 DIAGNOSIS — M6281 Muscle weakness (generalized): Secondary | ICD-10-CM | POA: Diagnosis not present

## 2017-10-29 DIAGNOSIS — M25561 Pain in right knee: Secondary | ICD-10-CM | POA: Diagnosis not present

## 2017-10-29 DIAGNOSIS — M6281 Muscle weakness (generalized): Secondary | ICD-10-CM | POA: Diagnosis not present

## 2017-10-29 DIAGNOSIS — R262 Difficulty in walking, not elsewhere classified: Secondary | ICD-10-CM | POA: Diagnosis not present

## 2017-10-29 DIAGNOSIS — M25661 Stiffness of right knee, not elsewhere classified: Secondary | ICD-10-CM | POA: Diagnosis not present

## 2017-11-04 DIAGNOSIS — M25561 Pain in right knee: Secondary | ICD-10-CM | POA: Diagnosis not present

## 2017-11-04 DIAGNOSIS — M6281 Muscle weakness (generalized): Secondary | ICD-10-CM | POA: Diagnosis not present

## 2017-11-04 DIAGNOSIS — R262 Difficulty in walking, not elsewhere classified: Secondary | ICD-10-CM | POA: Diagnosis not present

## 2017-11-04 DIAGNOSIS — M25661 Stiffness of right knee, not elsewhere classified: Secondary | ICD-10-CM | POA: Diagnosis not present

## 2017-11-06 DIAGNOSIS — M6281 Muscle weakness (generalized): Secondary | ICD-10-CM | POA: Diagnosis not present

## 2017-11-06 DIAGNOSIS — M25661 Stiffness of right knee, not elsewhere classified: Secondary | ICD-10-CM | POA: Diagnosis not present

## 2017-11-06 DIAGNOSIS — R262 Difficulty in walking, not elsewhere classified: Secondary | ICD-10-CM | POA: Diagnosis not present

## 2017-11-06 DIAGNOSIS — M25561 Pain in right knee: Secondary | ICD-10-CM | POA: Diagnosis not present

## 2017-11-08 DIAGNOSIS — R262 Difficulty in walking, not elsewhere classified: Secondary | ICD-10-CM | POA: Diagnosis not present

## 2017-11-08 DIAGNOSIS — M25561 Pain in right knee: Secondary | ICD-10-CM | POA: Diagnosis not present

## 2017-11-08 DIAGNOSIS — M25661 Stiffness of right knee, not elsewhere classified: Secondary | ICD-10-CM | POA: Diagnosis not present

## 2017-11-08 DIAGNOSIS — M6281 Muscle weakness (generalized): Secondary | ICD-10-CM | POA: Diagnosis not present

## 2017-11-11 ENCOUNTER — Ambulatory Visit (INDEPENDENT_AMBULATORY_CARE_PROVIDER_SITE_OTHER): Payer: Medicare Other | Admitting: Orthopaedic Surgery

## 2017-11-11 ENCOUNTER — Ambulatory Visit (INDEPENDENT_AMBULATORY_CARE_PROVIDER_SITE_OTHER): Payer: Medicare Other

## 2017-11-11 ENCOUNTER — Encounter (INDEPENDENT_AMBULATORY_CARE_PROVIDER_SITE_OTHER): Payer: Self-pay | Admitting: Orthopaedic Surgery

## 2017-11-11 DIAGNOSIS — Z96651 Presence of right artificial knee joint: Secondary | ICD-10-CM

## 2017-11-11 MED ORDER — AMOXICILLIN 500 MG PO TABS: ORAL_TABLET | ORAL | 0 refills | Status: DC

## 2017-11-11 NOTE — Progress Notes (Signed)
   Post-Op Visit Note   Patient: Rachel Crane           Date of Birth: May 05, 1946           MRN: [id number] Visit Date: 11/11/2017 PCP: Clent Demark, PA-C   Assessment & Plan:  Chief Complaint:  Chief Complaint  Patient presents with  . Right Knee - Pain, Routine Post Op   Visit Diagnoses:  1. Status post total right knee replacement     Plan: Patient is a pleasant 72 year old female who presents to our clinic today 53 days status post right total knee replacement, date of surgery 09/19/2017.  She has been doing great.  She has an outpatient physical therapy where she has made wonderful progress.  She still admits to some pain worse at night.  Mrs. getting better overall.  Examination of her right knee shows a well-healed surgical incision without evidence of infection.  Range of motion from 2 to 120 degrees.  She is stable to valgus and varus stress.  Calf is soft nontender.  She is neurovascularly intact distally.  This point, we will have her continue with physical therapy to work on range of motion strengthening.  She does note that she recently chipped her tooth and has to have this pulled.  We have called in amoxicillin to take prior to the dental procedure.  She also notes that she has an upcoming trip to [place] in June.  I have encouraged her to wear compression stockings, take aspirin the day of the flight and walk around as much as possible during the flight to decrease her risk of DVT.  She will follow-up with [place] in 4 weeks time for repeat evaluation.  Call with concerns or questions in the meantime.  Follow-Up Instructions: Return in about 5 weeks (around 12/16/2017).   Orders:  Orders Placed This Encounter  Procedures  . XR KNEE 3 VIEW RIGHT   Meds ordered this encounter  Medications  . amoxicillin (AMOXIL) 500 MG tablet    Sig: TAKE 4 TABS PO 1 HOUR BEFORE PROCEDURE.    Dispense:  4 tablet    Refill:  0    Imaging: Xr Knee 3 View Right  Result Date:  11/11/2017 Stable right total knee replacement   PMFS History: Patient Active Problem List   Diagnosis Date Noted  . Total knee replacement status 09/19/2017  . Sprain of anterior talofibular ligament of right ankle 12/13/2016   Past Medical History:  Diagnosis Date  . Arthritis   . Headache    H/O bad HA, since using CPAP- she has had improvement with that issue   . Hypertension   . OSA on CPAP    settting - 8, uses CPAP q night     Family History  Problem Relation Age of Onset  . Breast cancer Sister     Past Surgical History:  Procedure Laterality Date  . CHOLECYSTECTOMY  1998  . TOTAL KNEE ARTHROPLASTY Right 09/19/2017   Procedure: RIGHT TOTAL KNEE ARTHROPLASTY;  Surgeon: Leandrew Koyanagi, MD;  Location: [facility];  Service: Orthopedics;  Laterality: Right;  . TUBAL LIGATION     Social History   Occupational History  . Not on file  Tobacco Use  . Smoking status: Never Smoker  . Smokeless tobacco: Never Used  Substance and Sexual Activity  . Alcohol use: No  . Drug use: No  . Sexual activity: Not Currently

## 2017-11-12 DIAGNOSIS — M25561 Pain in right knee: Secondary | ICD-10-CM | POA: Diagnosis not present

## 2017-11-12 DIAGNOSIS — M25661 Stiffness of right knee, not elsewhere classified: Secondary | ICD-10-CM | POA: Diagnosis not present

## 2017-11-12 DIAGNOSIS — M6281 Muscle weakness (generalized): Secondary | ICD-10-CM | POA: Diagnosis not present

## 2017-11-12 DIAGNOSIS — R262 Difficulty in walking, not elsewhere classified: Secondary | ICD-10-CM | POA: Diagnosis not present

## 2017-11-15 ENCOUNTER — Telehealth (INDEPENDENT_AMBULATORY_CARE_PROVIDER_SITE_OTHER): Payer: Self-pay | Admitting: Radiology

## 2017-11-15 DIAGNOSIS — M25661 Stiffness of right knee, not elsewhere classified: Secondary | ICD-10-CM | POA: Diagnosis not present

## 2017-11-15 DIAGNOSIS — R262 Difficulty in walking, not elsewhere classified: Secondary | ICD-10-CM | POA: Diagnosis not present

## 2017-11-15 DIAGNOSIS — M6281 Muscle weakness (generalized): Secondary | ICD-10-CM | POA: Diagnosis not present

## 2017-11-15 DIAGNOSIS — M25561 Pain in right knee: Secondary | ICD-10-CM | POA: Diagnosis not present

## 2017-11-15 NOTE — Telephone Encounter (Signed)
Patient came in to the office requesting more samples of Pennsaid.  I spoke with Elmyra Ricks, PA-C who authorized ok for samples. 2 boxes of Pennsaid samples given to patient.

## 2017-11-18 DIAGNOSIS — M25661 Stiffness of right knee, not elsewhere classified: Secondary | ICD-10-CM | POA: Diagnosis not present

## 2017-11-18 DIAGNOSIS — M6281 Muscle weakness (generalized): Secondary | ICD-10-CM | POA: Diagnosis not present

## 2017-11-18 DIAGNOSIS — M25561 Pain in right knee: Secondary | ICD-10-CM | POA: Diagnosis not present

## 2017-11-18 DIAGNOSIS — R262 Difficulty in walking, not elsewhere classified: Secondary | ICD-10-CM | POA: Diagnosis not present

## 2017-11-28 DIAGNOSIS — R262 Difficulty in walking, not elsewhere classified: Secondary | ICD-10-CM | POA: Diagnosis not present

## 2017-11-28 DIAGNOSIS — M25661 Stiffness of right knee, not elsewhere classified: Secondary | ICD-10-CM | POA: Diagnosis not present

## 2017-11-28 DIAGNOSIS — M6281 Muscle weakness (generalized): Secondary | ICD-10-CM | POA: Diagnosis not present

## 2017-11-28 DIAGNOSIS — M25561 Pain in right knee: Secondary | ICD-10-CM | POA: Diagnosis not present

## 2017-12-03 DIAGNOSIS — R262 Difficulty in walking, not elsewhere classified: Secondary | ICD-10-CM | POA: Diagnosis not present

## 2017-12-03 DIAGNOSIS — M25661 Stiffness of right knee, not elsewhere classified: Secondary | ICD-10-CM | POA: Diagnosis not present

## 2017-12-03 DIAGNOSIS — M6281 Muscle weakness (generalized): Secondary | ICD-10-CM | POA: Diagnosis not present

## 2017-12-03 DIAGNOSIS — M25561 Pain in right knee: Secondary | ICD-10-CM | POA: Diagnosis not present

## 2017-12-05 DIAGNOSIS — H401131 Primary open-angle glaucoma, bilateral, mild stage: Secondary | ICD-10-CM | POA: Diagnosis not present

## 2017-12-06 DIAGNOSIS — M25661 Stiffness of right knee, not elsewhere classified: Secondary | ICD-10-CM | POA: Diagnosis not present

## 2017-12-06 DIAGNOSIS — R262 Difficulty in walking, not elsewhere classified: Secondary | ICD-10-CM | POA: Diagnosis not present

## 2017-12-06 DIAGNOSIS — M6281 Muscle weakness (generalized): Secondary | ICD-10-CM | POA: Diagnosis not present

## 2017-12-06 DIAGNOSIS — M25561 Pain in right knee: Secondary | ICD-10-CM | POA: Diagnosis not present

## 2017-12-10 ENCOUNTER — Ambulatory Visit (INDEPENDENT_AMBULATORY_CARE_PROVIDER_SITE_OTHER): Payer: Medicare Other | Admitting: Orthopaedic Surgery

## 2017-12-10 ENCOUNTER — Ambulatory Visit (INDEPENDENT_AMBULATORY_CARE_PROVIDER_SITE_OTHER): Payer: Medicare Other

## 2017-12-10 ENCOUNTER — Encounter (INDEPENDENT_AMBULATORY_CARE_PROVIDER_SITE_OTHER): Payer: Self-pay | Admitting: Orthopaedic Surgery

## 2017-12-10 DIAGNOSIS — Z96651 Presence of right artificial knee joint: Secondary | ICD-10-CM

## 2017-12-10 DIAGNOSIS — M25661 Stiffness of right knee, not elsewhere classified: Secondary | ICD-10-CM | POA: Diagnosis not present

## 2017-12-10 DIAGNOSIS — R262 Difficulty in walking, not elsewhere classified: Secondary | ICD-10-CM | POA: Diagnosis not present

## 2017-12-10 DIAGNOSIS — M25561 Pain in right knee: Secondary | ICD-10-CM | POA: Diagnosis not present

## 2017-12-10 DIAGNOSIS — M6281 Muscle weakness (generalized): Secondary | ICD-10-CM | POA: Diagnosis not present

## 2017-12-10 MED ORDER — AMOXICILLIN 500 MG PO CAPS: 2000.0000 mg | ORAL_CAPSULE | Freq: Once | ORAL | 6 refills | Status: AC

## 2017-12-10 NOTE — Progress Notes (Signed)
   Post-Op Visit Note   Patient: Rachel Crane           Date of Birth: [birth year]           MRN: [id number] Visit Date: 12/10/2017 PCP: Clent Demark, PA-C   Assessment & Plan:  Chief Complaint:  Chief Complaint  Patient presents with  . Right Knee - Follow-up, Routine Post Op, Pain, Edema   Visit Diagnoses:  1. Status post total right knee replacement     Plan: 3 month TKA follow up plan  Patient now 3 months status post right total knee arthroplasty. Wound is healed with no signs of complications or infection.  The patient does not complain of pain, but does endorse easy fatiguability. It was reinforced that prophylactic antibiotics should be taken with any procedure including but not limited to dental work or colonoscopies.  We will plan on following up at the 6 month postop visit with 2 view xrays of the operative knee at that time. As always, instructions were given to call with any questions or concerns in the interim.  She states she is not ready to return to work standing on her feet yet.  Anticipate releasing her back to work in July.    Follow-Up Instructions: Return in about 3 months (around 03/12/2018).   Orders:  Orders Placed This Encounter  Procedures  . XR Knee 1-2 Views Right   Meds ordered this encounter  Medications  . amoxicillin (AMOXIL) 500 MG capsule    Sig: Take 4 capsules (2,000 mg total) by mouth once for 1 dose.    Dispense:  4 capsule    Refill:  6    Imaging: Xr Knee 1-2 Views Right  Result Date: 12/10/2017 Stable total knee replacement in good alignment    PMFS History: Patient Active Problem List   Diagnosis Date Noted  . Total knee replacement status 09/19/2017  . Sprain of anterior talofibular ligament of right ankle 12/13/2016   Past Medical History:  Diagnosis Date  . Arthritis   . Headache    H/O bad HA, since using CPAP- she has had improvement with that issue   . Hypertension   . OSA on CPAP    settting - 8, uses CPAP q  night     Family History  Problem Relation Age of Onset  . Breast cancer Sister     Past Surgical History:  Procedure Laterality Date  . CHOLECYSTECTOMY  1998  . TOTAL KNEE ARTHROPLASTY Right 09/19/2017   Procedure: RIGHT TOTAL KNEE ARTHROPLASTY;  Surgeon: Leandrew Koyanagi, MD;  Location: [facility];  Service: Orthopedics;  Laterality: Right;  . TUBAL LIGATION     Social History   Occupational History  . Not on file  Tobacco Use  . Smoking status: Never Smoker  . Smokeless tobacco: Never Used  Substance and Sexual Activity  . Alcohol use: No  . Drug use: No  . Sexual activity: Not Currently

## 2017-12-11 ENCOUNTER — Telehealth (INDEPENDENT_AMBULATORY_CARE_PROVIDER_SITE_OTHER): Payer: Self-pay

## 2017-12-11 NOTE — Telephone Encounter (Signed)
Patient called somehow left VM on my phone asking if she an go back to work end of June, if so needs a note. She would like a call back

## 2017-12-12 ENCOUNTER — Ambulatory Visit (INDEPENDENT_AMBULATORY_CARE_PROVIDER_SITE_OTHER): Payer: Medicare Other | Admitting: Neurology

## 2017-12-12 ENCOUNTER — Encounter: Payer: Self-pay | Admitting: Neurology

## 2017-12-12 VITALS — BP 136/76 | HR 72 | Ht 66.0 in | Wt 204.0 lb

## 2017-12-12 DIAGNOSIS — Z9989 Dependence on other enabling machines and devices: Secondary | ICD-10-CM | POA: Diagnosis not present

## 2017-12-12 DIAGNOSIS — G4733 Obstructive sleep apnea (adult) (pediatric): Secondary | ICD-10-CM | POA: Diagnosis not present

## 2017-12-12 NOTE — Telephone Encounter (Signed)
Called patient to see if she had a specific date to go back to work. No answer LMOM.

## 2017-12-12 NOTE — Patient Instructions (Signed)

## 2017-12-12 NOTE — Telephone Encounter (Signed)
See message.

## 2017-12-12 NOTE — Progress Notes (Signed)
Subjective:    Patient ID: Rachel Crane is a 72 y.o. female.  HPI     Interim history:   Rachel Crane is a 72 year old right-handed woman with an underlying medical history of hypertension, arthritis, and obesity, who presents for follow-up of her recurrent headaches and sleep apnea, on CPAP therapy. The patient is unaccompanied today. I last saw her on 12/11/2016, at which time she was compliant with her CPAP. She reported going to bed late, around midnight. She did endorse improvement of her sleep quality and headaches were much improved. She was supposed to have a stress test due to symptoms of shortness of breath. Very good  She was seen in the interim on 06/12/2017 by Ward Givens, nurse practitioner, at which time she was compliant with treatment but her residual AHI was borderline at 5.9, her treatment pressure was therefore increased to 8 cm.   Today, 12/12/2017: I reviewed her CPAP compliance data from 11/11/2017 through 12/10/2017 which is a total of 30 days, during which time she used her CPAP 29 days with percent used days greater than 4 hours at 97%, indicating excellent compliance with an average usage of 7 hours and 9 minutes, residual AHI at goal at 1.9 per hour, leak acceptable with the 95th percentile at 14.5 L/m on a pressure of 8 cm with EPR of 2. She reports doing well with her CPAP. Has had a little bit of fluctuation in her weight by a few pounds up and down, she had knee replacement surgery on the right in the interim with good results thus far, she has physical therapy currently outpatient twice a week. She is motivated to exercise more and try to lose weight. She is willing to continue with CPAP therapy, headaches are essentially gone. Her children are scattered a little bit, she has a son in [place], she visited him recently and forgot her CPAP machine that the only time she missed a night recently. She has a daughter who is a physician in [place], one daughter is a school  principal and lives in [place], 1 daughter in [place] works for [employer]. She does not drive long distance. Her kids typically come and visit or take her and bring her back. her blood pressure Is generally under control.    The patient's allergies, current medications, family history, past medical history, past social history, past surgical history and problem list were reviewed and updated as appropriate.    Previously (copied from previous notes for reference):    I first met her on 09/06/2016 is a referral from the emergency room, at which time she reported bilateral recurrent headaches, also nocturnal headaches. Given her history of snoring, I suggested she proceed with a sleep study. She had a baseline sleep study, followed by a CPAP titration study. I went over her test results with her in detail today. Her baseline sleep study from 09/18/2016 showed a sleep efficiency of 81%, an increased percentage of stage II sleep, absence of slow-wave sleep and REM sleep was 20.8% with a normal REM latency. Total AHI was elevated at 40.5 per hour, average oxygen saturation was 96%, nadir was 81%. She had noticed significant PLMS. Based on her test results as well as her sleep-related complaints and recurrent headaches I suggested she return for a CPAP titration study, which she had on 10/09/2016. She was fitted with nasal pillows. Sleep efficiency was 55.6%, sleep latency was 77.5 minutes, REM latency was 52.5 minutes. She had an increased percentage of slow-wave sleep  and REM sleep was 18.3%. She was titrated from 5 cm of CPAP to 6 cm of CPAP, final AHI was 0 per hour, O2 nadir of 95%, supine REM sleep achieved. Based on her test results I prescribed CPAP therapy for home use.   I reviewed her CPAP compliance data from 10/29/2016 through 11/27/2016, which is a total of 30 days but technically she had only been on treatment for 28 days but was compliant with a compliance percentage of 70%, average usage of  5 hours and 36 minutes, residual AHI borderline at 6.6, leak acceptable with the 95th percentile at 16.8 L/m on a pressure of 6 cm with EPR of 2.     09/06/2016: She presented to the emergency room on 08/30/2016 with a two-week history of severe headache which was primarily bilateral headache, also on the top of her head. I reviewed the emergency room records. She reported a sharp intermittent headache. It was worse with lying down and became throbbing. She had taken some ibuprofen over-the-counter. She did not report much in the way of photophobia. She had no nausea or vomiting or other neurological accompaniment and no history of trauma. She had a head CT without contrast on 08/30/2016 which I reviewed: No acute intracranial abnormalities. In addition, I first only reviewed the images through the PACS system. I did not detect any significant atrophy or white matter changes. She was treated symptomatically with Tylenol, naproxen, and she declined Reglan. She was discharged to home. She reports that she has not been able to sleep very well. She has intermittent, rare in fact episodes of sharp shooting pains that start in the right occipital area and lasts for only seconds at a time. In addition, she has woken up in the middle of the night with headaches that have been building up gradually and seemed to be worse when she does not sleep well. She reports significant issues with her sleep since she moved here from [place]. She moved in early November to be closer to her children. She has 1 child in [place], 2 in [place]. She lives in her own home. She has adjusted to her new living environment but still has some anxiety and has not been able to sleep very well. She has a known history of snoring. She has woken herself up with a sense of gasping for air and foreign body sensation in her throat. She goes to bed sometimes as early as 8 PM, he watches TV in her bedroom but turns it off. She has a small dog that sometimes  sleeps at the foot and but does not bother her. She lives alone. She is widowed for the past 7 years. Wakeup time varies. She is often up in the early morning hours and cannot go back to sleep. She has had more anxiety and even mild depression type symptoms lately. She is scheduled to see her new primary care provider on March 1. She does not drink caffeine daily, she drinks alcohol very occasionally and does not smoke. She tries to drink water and some juice. She has knee pain at times but denies frank restless leg type symptoms. She has significant nocturia about 3-4 times per average night. She tries to rest during the day but typically does not fall asleep. Epworth sleepiness score is 0 out of 24, fatigue score is 63 out of 63.  Her Past Medical History Is Significant For: Past Medical History:  Diagnosis Date  . Arthritis   . Headache  H/O bad HA, since using CPAP- she has had improvement with that issue   . Hypertension   . OSA on CPAP    settting - 8, uses CPAP q night     Her Past Surgical History Is Significant For: Past Surgical History:  Procedure Laterality Date  . CHOLECYSTECTOMY  1998  . TOTAL KNEE ARTHROPLASTY Right 09/19/2017   Procedure: RIGHT TOTAL KNEE ARTHROPLASTY;  Surgeon: Leandrew Koyanagi, MD;  Location: [facility];  Service: Orthopedics;  Laterality: Right;  . TUBAL LIGATION      Her Family History Is Significant For: Family History  Problem Relation Age of Onset  . Breast cancer Sister     Her Social History Is Significant For: Social History   Socioeconomic History  . Marital status: Widowed    Spouse name: Not on file  . Number of children: Not on file  . Years of education: Not on file  . Highest education level: Not on file  Occupational History  . Not on file  Social Needs  . Financial resource strain: Not on file  . Food insecurity:    Worry: Not on file    Inability: Not on file  . Transportation needs:    Medical: Not on file    Non-medical: Not  on file  Tobacco Use  . Smoking status: Never Smoker  . Smokeless tobacco: Never Used  Substance and Sexual Activity  . Alcohol use: No  . Drug use: No  . Sexual activity: Not Currently  Lifestyle  . Physical activity:    Days per week: Not on file    Minutes per session: Not on file  . Stress: Not on file  Relationships  . Social connections:    Talks on phone: Not on file    Gets together: Not on file    Attends religious service: Not on file    Active member of club or organization: Not on file    Attends meetings of clubs or organizations: Not on file    Relationship status: Not on file  Other Topics Concern  . Not on file  Social History Narrative  . Not on file    Her Allergies Are:  No Known Allergies:   Her Current Medications Are:  Outpatient Encounter Medications as of 12/12/2017  Medication Sig  . losartan (COZAAR) 100 MG tablet Take 1 tablet (100 mg total) by mouth daily.  . [DISCONTINUED] amoxicillin (AMOXIL) 500 MG tablet TAKE 4 TABS PO 1 HOUR BEFORE PROCEDURE.  . [DISCONTINUED] aspirin EC 325 MG tablet Take 1 tablet (325 mg total) by mouth 2 (two) times daily.  . [DISCONTINUED] cholecalciferol (VITAMIN D) 1000 units tablet Take 2,000 Units by mouth daily.   . [DISCONTINUED] lovastatin (MEVACOR) 20 MG tablet Take 1 tablet (20 mg total) by mouth at bedtime.  . [DISCONTINUED] meloxicam (MOBIC) 7.5 MG tablet Take 1 tablet (7.5 mg total) by mouth daily.  . [DISCONTINUED] potassium chloride (K-DUR) 10 MEQ tablet Take 1 tablet (10 mEq total) by mouth 2 (two) times daily.  . [DISCONTINUED] senna-docusate (SENOKOT S) 8.6-50 MG tablet Take 1 tablet by mouth at bedtime as needed.   No facility-administered encounter medications on file as of 12/12/2017.   :  Review of Systems:  Out of a complete 14 point review of systems, all are reviewed and negative with the exception of these symptoms as listed below: Review of Systems  Neurological:       Pt presents today to  discuss her cpap.  Pt says that is doing well.    Objective:  Neurological Exam  Physical Exam Physical Examination:   Vitals:   12/12/17 1356  BP: 136/76  Pulse: 72    General Examination: The patient is a very pleasant 72 y.o. female in no acute distress. She appears well-developed and well-nourished and well groomed. Good spirits.  HEENT:Normocephalic, atraumatic, pupils are equal, round and reactive to light and accommodation. Bilateral cataracts. She has corrective eyeglasses, needs the frame fixed. Extraocular tracking is good without limitation to gaze excursion or nystagmus noted. Normal smooth pursuit is noted. Hearing is grossly intact. Face is symmetric with normal facial animation and normal facial sensation. Speech is clear with no dysarthria noted. There is no hypophonia. There is no lip, neck/head, jaw or voice tremor. Neck is supple with full range of passive and active motion. Oropharynx exam reveals: mildmouth dryness, adequatedental hygiene and markedairway crowding. Tongue protrudes centrally and palate elevates symmetrically.    Chest:Clear to auscultation without wheezing, rhonchi or crackles noted.  Heart:S1+S2+0, regular and normal without murmurs, rubs or gallops noted.   Abdomen:Soft, non-tender and non-distended with normal bowel sounds appreciated on auscultation.  Extremities:There is tracepitting edema around the L ankle.   Skin: Warm and dry without trophic changes noted. There are no varicose veins.  Musculoskeletal: exam reveals no obvious joint deformities, tenderness or joint swelling or erythema, with the exception of mild left knee pain, good range of motion right knee, unremarkable scar from right total knee replacement recently.  Neurologically:  Mental status: The patient is awake, alert and oriented in all 4 spheres. Herimmediate and remote memory, attention, language skills and fund of knowledge are appropriate. There is no  evidence of aphasia, agnosia, apraxia or anomia. Speech is clear with normal prosody and enunciation. Thought process is linear. Mood is normaland affect is anxious. Cranial nerves II - XII are as described above under HEENT exam. In addition: shoulder shrug is normal with equal shoulder height noted. Motor exam: Normal bulk, strength and tone is noted. There is no tremor. Fine motor skills and coordination: grossly intact.  Cerebellar testing: No dysmetria or intention tremor. There is no truncal or gait ataxia.  Sensory exam: intact to light touch in the upper and lower extremities.  Gait, station and balance: Shestands easily. No veering to one side is noted. No leaning to one side is noted. Posture is age-appropriate and stance is narrow based. Gait shows normalstride length and normalpace. No problems turning are noted.   Assessment and Plan:  In summary, Rochanda Harpham a very pleasant 72 year old female with an underlying medical history of hypertension, arthritis, and obesity, who presents for FU consultation of her obstructive sleep apnea and history of nocturnal headaches. She has established treatment with CPAP which is now at 8 cm. She fully compliant with treatment. She had to skip a night because she forgot her machine recently other than that she is compliant with treatment and has benefited from it. She had a baseline sleep study on 09/18/16, followed by a CPAP titration study on 10/09/2016.we reviewed her most recent compliance data. She had severe sleep apnea based on her baseline AHI of 3.5 per hour, O2 nadir of 81%. She is essentially without headaches at this point. She had a prior head CT which was benign. Neurological exam continues to be nonfocal. She reports sleeping better, feeling better rested and overall having more daytime energy. At this juncture, I suggested she try to work on her weight reduction a  little bit more, she is motivated to exercise more as she has done well  after her recent right knee replacement surgery. She may need left total knee replacement at some point. She is under the care of Dr. Erlinda Hong for this. From a sleep apnea standpoint and headache standpoint she has done rather well. I suggested a one-year checkup, she can see Jinny Blossom again next time. I answered all her questions today and she was in agreement.  I spent 25 minutes in total face-to-face time with the patient, more than 50% of which was spent in counseling and coordination of care, reviewing test results, reviewing medication and discussing or reviewing the diagnosis of OSA, its prognosis and treatment options. Pertinent laboratory and imaging test results that were available during this visit with the patient were reviewed by me and considered in my medical decision making (see chart for details).

## 2017-12-12 NOTE — Telephone Encounter (Signed)
yes

## 2017-12-13 ENCOUNTER — Encounter (INDEPENDENT_AMBULATORY_CARE_PROVIDER_SITE_OTHER): Payer: Self-pay

## 2017-12-13 NOTE — Telephone Encounter (Signed)
Letter ready for pick up . Patient aware. She would like to come in 2 days before she returns back to work which is 01/09/18.

## 2017-12-17 DIAGNOSIS — M25561 Pain in right knee: Secondary | ICD-10-CM | POA: Diagnosis not present

## 2017-12-17 DIAGNOSIS — R262 Difficulty in walking, not elsewhere classified: Secondary | ICD-10-CM | POA: Diagnosis not present

## 2017-12-17 DIAGNOSIS — M25661 Stiffness of right knee, not elsewhere classified: Secondary | ICD-10-CM | POA: Diagnosis not present

## 2017-12-17 DIAGNOSIS — M6281 Muscle weakness (generalized): Secondary | ICD-10-CM | POA: Diagnosis not present

## 2017-12-20 DIAGNOSIS — M6281 Muscle weakness (generalized): Secondary | ICD-10-CM | POA: Diagnosis not present

## 2017-12-20 DIAGNOSIS — M25661 Stiffness of right knee, not elsewhere classified: Secondary | ICD-10-CM | POA: Diagnosis not present

## 2017-12-20 DIAGNOSIS — R262 Difficulty in walking, not elsewhere classified: Secondary | ICD-10-CM | POA: Diagnosis not present

## 2017-12-20 DIAGNOSIS — M25561 Pain in right knee: Secondary | ICD-10-CM | POA: Diagnosis not present

## 2017-12-23 DIAGNOSIS — H2513 Age-related nuclear cataract, bilateral: Secondary | ICD-10-CM | POA: Diagnosis not present

## 2017-12-25 DIAGNOSIS — M25561 Pain in right knee: Secondary | ICD-10-CM | POA: Diagnosis not present

## 2017-12-25 DIAGNOSIS — M25661 Stiffness of right knee, not elsewhere classified: Secondary | ICD-10-CM | POA: Diagnosis not present

## 2017-12-25 DIAGNOSIS — R262 Difficulty in walking, not elsewhere classified: Secondary | ICD-10-CM | POA: Diagnosis not present

## 2017-12-25 DIAGNOSIS — M6281 Muscle weakness (generalized): Secondary | ICD-10-CM | POA: Diagnosis not present

## 2017-12-27 DIAGNOSIS — M25561 Pain in right knee: Secondary | ICD-10-CM | POA: Diagnosis not present

## 2017-12-27 DIAGNOSIS — M6281 Muscle weakness (generalized): Secondary | ICD-10-CM | POA: Diagnosis not present

## 2017-12-27 DIAGNOSIS — M25661 Stiffness of right knee, not elsewhere classified: Secondary | ICD-10-CM | POA: Diagnosis not present

## 2017-12-27 DIAGNOSIS — R262 Difficulty in walking, not elsewhere classified: Secondary | ICD-10-CM | POA: Diagnosis not present

## 2017-12-31 DIAGNOSIS — M6281 Muscle weakness (generalized): Secondary | ICD-10-CM | POA: Diagnosis not present

## 2017-12-31 DIAGNOSIS — R262 Difficulty in walking, not elsewhere classified: Secondary | ICD-10-CM | POA: Diagnosis not present

## 2017-12-31 DIAGNOSIS — M25561 Pain in right knee: Secondary | ICD-10-CM | POA: Diagnosis not present

## 2017-12-31 DIAGNOSIS — M25661 Stiffness of right knee, not elsewhere classified: Secondary | ICD-10-CM | POA: Diagnosis not present

## 2018-01-09 ENCOUNTER — Encounter (INDEPENDENT_AMBULATORY_CARE_PROVIDER_SITE_OTHER): Payer: Self-pay | Admitting: Orthopaedic Surgery

## 2018-01-09 ENCOUNTER — Ambulatory Visit (INDEPENDENT_AMBULATORY_CARE_PROVIDER_SITE_OTHER): Payer: Medicare Other | Admitting: Orthopaedic Surgery

## 2018-01-09 DIAGNOSIS — Z96651 Presence of right artificial knee joint: Secondary | ICD-10-CM | POA: Diagnosis not present

## 2018-01-09 NOTE — Progress Notes (Signed)
   Office Visit Note   Patient: Rachel Crane           Date of Birth: Nov 15, 1945           MRN: [id number] Visit Date: 01/09/2018              Requested by: Clent Demark, PA-C [place], [place] [address] PCP: Clent Demark, PA-C   Assessment & Plan: Visit Diagnoses:  1. Status post total right knee replacement     Plan: Patient is status post right total knee replacement and doing well.  At this point she is released back to like to recheck her in 3 months with 2 view x-rays of the right knee.  No prophylaxis was reinforced.    Follow-Up Instructions: Return in about 3 months (around 04/11/2018).   Orders:  No orders of the defined types were placed in this encounter.  No orders of the defined types were placed in this encounter.     Procedures: No procedures performed   Clinical Data: No additional findings.   Subjective: Chief Complaint  Patient presents with  . Right Knee - Pain, Follow-up    S/P TKA 09/19/17    Patient is almost 4 months status post right total knee replacement.  Overall she is doing very well.  She has 2 more sessions of physical therapy and planning to return back to work this Saturday.  Overall she is very happy with her progress.   Review of Systems   Objective: Vital Signs: There were no vitals taken for this visit.  Physical Exam  Ortho Exam Right knee exam shows a fully healed surgical scar.  She has excellent range of motion.  She is ambulating well. Specialty Comments:  No specialty comments available.  Imaging: No results found.   PMFS History: Patient Active Problem List   Diagnosis Date Noted  . Total knee replacement status 09/19/2017  . Sprain of anterior talofibular ligament of right ankle 12/13/2016   Past Medical History:  Diagnosis Date  . Arthritis   . Headache    H/O bad HA, since using CPAP- she has had improvement with that issue   . Hypertension   . OSA on CPAP    settting - 8,  uses CPAP q night     Family History  Problem Relation Age of Onset  . Breast cancer Sister     Past Surgical History:  Procedure Laterality Date  . CHOLECYSTECTOMY  1998  . TOTAL KNEE ARTHROPLASTY Right 09/19/2017   Procedure: RIGHT TOTAL KNEE ARTHROPLASTY;  Surgeon: Leandrew Koyanagi, MD;  Location: [facility];  Service: Orthopedics;  Laterality: Right;  . TUBAL LIGATION     Social History   Occupational History  . Not on file  Tobacco Use  . Smoking status: Never Smoker  . Smokeless tobacco: Never Used  Substance and Sexual Activity  . Alcohol use: No  . Drug use: No  . Sexual activity: Not Currently

## 2018-01-20 ENCOUNTER — Telehealth (INDEPENDENT_AMBULATORY_CARE_PROVIDER_SITE_OTHER): Payer: Self-pay

## 2018-01-20 NOTE — Telephone Encounter (Signed)
Faxed work note to [employer]. Requested by Patient.   [employer] Fax Number [phone]

## 2018-01-21 ENCOUNTER — Encounter (INDEPENDENT_AMBULATORY_CARE_PROVIDER_SITE_OTHER): Payer: Self-pay | Admitting: Orthopaedic Surgery

## 2018-01-21 ENCOUNTER — Ambulatory Visit (INDEPENDENT_AMBULATORY_CARE_PROVIDER_SITE_OTHER): Payer: Medicare Other | Admitting: Orthopaedic Surgery

## 2018-01-21 DIAGNOSIS — G8929 Other chronic pain: Secondary | ICD-10-CM | POA: Diagnosis not present

## 2018-01-21 DIAGNOSIS — M25562 Pain in left knee: Secondary | ICD-10-CM

## 2018-01-21 MED ORDER — BUPIVACAINE HCL 0.25 % IJ SOLN
2.0000 mL | INTRAMUSCULAR | Status: AC | PRN
  Administered 2018-01-21: 2 mL via INTRA_ARTICULAR

## 2018-01-21 MED ORDER — LIDOCAINE HCL 1 % IJ SOLN
2.0000 mL | INTRAMUSCULAR | Status: AC | PRN
  Administered 2018-01-21: 2 mL

## 2018-01-21 MED ORDER — METHYLPREDNISOLONE ACETATE 40 MG/ML IJ SUSP
40.0000 mg | INTRAMUSCULAR | Status: AC | PRN
  Administered 2018-01-21: 40 mg via INTRA_ARTICULAR

## 2018-01-21 NOTE — Progress Notes (Signed)
Office Visit Note   Patient: Rachel Crane           Date of Birth: 25-Sep-1945           MRN: [id number] Visit Date: 01/21/2018              Requested by: Clent Demark, PA-C [place], [place] [address] PCP: Clent Demark, PA-C   Assessment & Plan: Visit Diagnoses:  1. Chronic pain of left knee     Plan: Impression is primary localized osteoarthritis to left knee.  We injected this with cortisone today.  She is thinking about replacing her left knee next year and I have told her that we can do cortisone injections every 3 months if needed until then.  She will follow-up with [place] in 3 months time for repeat evaluation of her right total knee replacement.  Follow-Up Instructions: Return in about 3 months (around 04/08/2018).   Orders:  Orders Placed This Encounter  Procedures  . Large Joint Inj: L knee   No orders of the defined types were placed in this encounter.     Procedures: Large Joint Inj: L knee on 01/21/2018 1:23 PM Indications: pain Details: 22 G needle, anterolateral approach Medications: 2 mL lidocaine 1 %; 2 mL bupivacaine 0.25 %; 40 mg methylPREDNISolone acetate 40 MG/ML      Clinical Data: No additional findings.   Subjective: Chief Complaint  Patient presents with  . Left Knee - Pain    HPI patient is a pleasant 72 year old female who presents to our clinic today with left knee pain.  She does have a history of osteoarthritis for the past several years.  The pain she has is to the medial aspect.  Pain is worse with activity.  Her pain did seem tolerable when she was taking narcotic pain medication following her right total knee replacement in March of this past year.  Her pain returned once she stopped the medication.  No previous cortisone injection or surgical intervention to the left knee.  Review of Systems as detailed in HPI.  All others reviewed and are negative.   Objective: Vital Signs: There were no vitals taken for  this visit.  Physical Exam well-developed and well-nourished female in no acute distress.  Alert and oriented x3.  Ortho Exam examination of the left knee shows a trace effusion.  Range of motion 0 to 125 degrees.  1+ patellofemoral crepitus.  Medial joint line tenderness.  She is neurovascularly intact distally.  Specialty Comments:  No specialty comments available.  Imaging: No new imaging today   PMFS History: Patient Active Problem List   Diagnosis Date Noted  . Total knee replacement status 09/19/2017  . Sprain of anterior talofibular ligament of right ankle 12/13/2016   Past Medical History:  Diagnosis Date  . Arthritis   . Headache    H/O bad HA, since using CPAP- she has had improvement with that issue   . Hypertension   . OSA on CPAP    settting - 8, uses CPAP q night     Family History  Problem Relation Age of Onset  . Breast cancer Sister     Past Surgical History:  Procedure Laterality Date  . CHOLECYSTECTOMY  1998  . TOTAL KNEE ARTHROPLASTY Right 09/19/2017   Procedure: RIGHT TOTAL KNEE ARTHROPLASTY;  Surgeon: Leandrew Koyanagi, MD;  Location: [facility];  Service: Orthopedics;  Laterality: Right;  . TUBAL LIGATION     Social History  Occupational History  . Not on file  Tobacco Use  . Smoking status: Never Smoker  . Smokeless tobacco: Never Used  Substance and Sexual Activity  . Alcohol use: No  . Drug use: No  . Sexual activity: Not Currently

## 2018-01-27 DIAGNOSIS — H401130 Primary open-angle glaucoma, bilateral, stage unspecified: Secondary | ICD-10-CM | POA: Diagnosis not present

## 2018-02-10 ENCOUNTER — Other Ambulatory Visit (INDEPENDENT_AMBULATORY_CARE_PROVIDER_SITE_OTHER): Payer: Self-pay | Admitting: Physician Assistant

## 2018-02-10 DIAGNOSIS — Z1231 Encounter for screening mammogram for malignant neoplasm of breast: Secondary | ICD-10-CM

## 2018-02-11 ENCOUNTER — Ambulatory Visit (INDEPENDENT_AMBULATORY_CARE_PROVIDER_SITE_OTHER): Payer: Medicare Other | Admitting: Orthopaedic Surgery

## 2018-02-28 ENCOUNTER — Other Ambulatory Visit (INDEPENDENT_AMBULATORY_CARE_PROVIDER_SITE_OTHER): Payer: Self-pay | Admitting: Physician Assistant

## 2018-02-28 DIAGNOSIS — Z760 Encounter for issue of repeat prescription: Secondary | ICD-10-CM

## 2018-02-28 MED ORDER — LOSARTAN POTASSIUM 100 MG PO TABS: 100.0000 mg | ORAL_TABLET | Freq: Every day | ORAL | 3 refills | Status: DC

## 2018-02-28 NOTE — Telephone Encounter (Signed)
FWD to PCP. Ramsey Guadamuz S Tyrrell Stephens, CMA  

## 2018-02-28 NOTE — Telephone Encounter (Signed)
Rx printed and given to patient as she wanted to wait for prescription.

## 2018-03-04 ENCOUNTER — Ambulatory Visit
Admission: RE | Admit: 2018-03-04 | Discharge: 2018-03-04 | Disposition: A | Discharge status: Home / Self Care | Payer: Medicare Other | Source: Ambulatory Visit | Attending: Physician Assistant | Admitting: Physician Assistant

## 2018-03-04 DIAGNOSIS — Z1231 Encounter for screening mammogram for malignant neoplasm of breast: Secondary | ICD-10-CM | POA: Diagnosis not present

## 2018-04-09 ENCOUNTER — Encounter (INDEPENDENT_AMBULATORY_CARE_PROVIDER_SITE_OTHER): Payer: Self-pay | Admitting: Orthopaedic Surgery

## 2018-04-09 ENCOUNTER — Telehealth (INDEPENDENT_AMBULATORY_CARE_PROVIDER_SITE_OTHER): Payer: Self-pay

## 2018-04-09 ENCOUNTER — Ambulatory Visit (INDEPENDENT_AMBULATORY_CARE_PROVIDER_SITE_OTHER): Payer: Medicare Other

## 2018-04-09 ENCOUNTER — Ambulatory Visit (INDEPENDENT_AMBULATORY_CARE_PROVIDER_SITE_OTHER): Payer: Medicare Other | Admitting: Orthopaedic Surgery

## 2018-04-09 VITALS — Ht 66.0 in | Wt 204.0 lb

## 2018-04-09 DIAGNOSIS — Z96651 Presence of right artificial knee joint: Secondary | ICD-10-CM | POA: Diagnosis not present

## 2018-04-09 DIAGNOSIS — M1712 Unilateral primary osteoarthritis, left knee: Secondary | ICD-10-CM

## 2018-04-09 NOTE — Progress Notes (Signed)
Office Visit Note   Patient: Rachel Crane           Date of Birth: [birth year]           MRN: [id number] Visit Date: 04/09/2018              Requested by: Clent Demark, PA-C [place], [place] [address] PCP: Clent Demark, PA-C   Assessment & Plan: Visit Diagnoses:  1. S/P total knee arthroplasty, right   2. Unilateral primary osteoarthritis, left knee     Plan: Patient is 6 months status post right total knee replacement and doing well.  From the left knee standpoint she would like to try Visco supplementation.  She will follow-up once the injection has been approved.  I would like to see her back in 6 months for her one-year follow-up with her right knee replacement. Total face to face encounter time was greater than 25 minutes and over half of this time was spent in counseling and/or coordination of care.  Follow-Up Instructions: Return in about 6 months (around 10/08/2018).   Orders:  Orders Placed This Encounter  Procedures  . XR KNEE 3 VIEW RIGHT   No orders of the defined types were placed in this encounter.     Procedures: No procedures performed   Clinical Data: No additional findings.   Subjective: Chief Complaint  Patient presents with  . Left Knee - Pain  . Right Knee - Follow-up    Rachel Crane is 6 months status post right total knee replacement.  She is doing well from this.  She is progressing and getting stronger.  She is happy with her progress.  Her left knee is bothering her significantly.  She would like to proceed with a left total knee replacement at some point next year.  For now she would like to strengthen her right knee first.  She has had minimal relief from cortisone injections and from pen said.     Review of Systems   Objective: Vital Signs: Ht 5\' 6"  (1.676 m)   Wt 204 lb (92.5 kg)   BMI 32.93 kg/m   Physical Exam  Ortho Exam Right knee exam shows a fully healed surgical scar.  Excellent range of motion.  No  joint effusion.  She does have some patellofemoral crepitus that is painless.  Left knee exam shows no joint effusion.  Cause and cruciates are stable. Specialty Comments:  No specialty comments available.  Imaging: Xr Knee 3 View Right  Result Date: 04/09/2018 Stable right total knee replacement.  Advanced left knee degenerative joint disease.    PMFS History: Patient Active Problem List   Diagnosis Date Noted  . Total knee replacement status 09/19/2017  . Sprain of anterior talofibular ligament of right ankle 12/13/2016   Past Medical History:  Diagnosis Date  . Arthritis   . Headache    H/O bad HA, since using CPAP- she has had improvement with that issue   . Hypertension   . OSA on CPAP    settting - 8, uses CPAP q night     Family History  Problem Relation Age of Onset  . Breast cancer Sister     Past Surgical History:  Procedure Laterality Date  . CHOLECYSTECTOMY  1998  . TOTAL KNEE ARTHROPLASTY Right 09/19/2017   Procedure: RIGHT TOTAL KNEE ARTHROPLASTY;  Surgeon: Leandrew Koyanagi, MD;  Location: [facility];  Service: Orthopedics;  Laterality: Right;  . TUBAL LIGATION  Social History   Occupational History  . Not on file  Tobacco Use  . Smoking status: Never Smoker  . Smokeless tobacco: Never Used  Substance and Sexual Activity  . Alcohol use: No  . Drug use: No  . Sexual activity: Not Currently

## 2018-04-09 NOTE — Telephone Encounter (Signed)
Please submit for Monovisc inj -Left knee- Dr Erlinda Hong

## 2018-04-10 ENCOUNTER — Telehealth (INDEPENDENT_AMBULATORY_CARE_PROVIDER_SITE_OTHER): Payer: Self-pay

## 2018-04-10 NOTE — Telephone Encounter (Signed)
Submitted VOB for Monovisc, left Knee.

## 2018-04-10 NOTE — Telephone Encounter (Signed)
Noted  

## 2018-04-22 ENCOUNTER — Telehealth (INDEPENDENT_AMBULATORY_CARE_PROVIDER_SITE_OTHER): Payer: Self-pay

## 2018-04-22 NOTE — Telephone Encounter (Signed)
Called patient on both numbers and left a VM for patient to call back and schedule appointment for gel injection with Dr. Erlinda Hong.  Patient is approved for Monovisc, left knee. Buy & Bill Covered at 100% through her insurance. No Co-pay No PA required

## 2018-05-12 ENCOUNTER — Encounter (INDEPENDENT_AMBULATORY_CARE_PROVIDER_SITE_OTHER): Payer: Self-pay | Admitting: Physician Assistant

## 2018-05-12 ENCOUNTER — Ambulatory Visit (INDEPENDENT_AMBULATORY_CARE_PROVIDER_SITE_OTHER): Payer: Medicare Other | Admitting: Physician Assistant

## 2018-05-12 VITALS — BP 114/69 | HR 72 | Temp 97.8°F | Resp 16 | Ht 66.0 in | Wt 208.0 lb

## 2018-05-12 DIAGNOSIS — Z760 Encounter for issue of repeat prescription: Secondary | ICD-10-CM | POA: Diagnosis not present

## 2018-05-12 DIAGNOSIS — Z23 Encounter for immunization: Secondary | ICD-10-CM | POA: Diagnosis not present

## 2018-05-12 DIAGNOSIS — Z1211 Encounter for screening for malignant neoplasm of colon: Secondary | ICD-10-CM

## 2018-05-12 DIAGNOSIS — R9431 Abnormal electrocardiogram [ECG] [EKG]: Secondary | ICD-10-CM | POA: Diagnosis not present

## 2018-05-12 DIAGNOSIS — Z029 Encounter for administrative examinations, unspecified: Secondary | ICD-10-CM | POA: Diagnosis not present

## 2018-05-12 DIAGNOSIS — R002 Palpitations: Secondary | ICD-10-CM | POA: Diagnosis not present

## 2018-05-12 DIAGNOSIS — R519 Headache, unspecified: Secondary | ICD-10-CM

## 2018-05-12 DIAGNOSIS — R51 Headache: Secondary | ICD-10-CM

## 2018-05-12 DIAGNOSIS — J358 Other chronic diseases of tonsils and adenoids: Secondary | ICD-10-CM | POA: Diagnosis not present

## 2018-05-12 MED ORDER — PNEUMOCOCCAL 13-VAL CONJ VACC IM SUSP
0.5000 mL | Freq: Once | INTRAMUSCULAR | Status: AC
  Administered 2018-05-12: 0.5 mL via INTRAMUSCULAR

## 2018-05-12 MED ORDER — PNEUMOCOCCAL 13-VAL CONJ VACC IM SUSP: 0.5000 mL | INTRAMUSCULAR | Status: DC

## 2018-05-12 MED ORDER — ASPIRIN EC 81 MG PO TBEC
81.0000 mg | DELAYED_RELEASE_TABLET | Freq: Every day | ORAL | 3 refills | Status: DC
Start: 2018-05-12 — End: 2020-06-08

## 2018-05-12 MED ORDER — NITROGLYCERIN 0.4 MG SL SUBL: 0.4000 mg | SUBLINGUAL_TABLET | SUBLINGUAL | 3 refills | Status: DC | PRN

## 2018-05-12 MED ORDER — CARVEDILOL 3.125 MG PO TABS
3.1250 mg | ORAL_TABLET | Freq: Two times a day (BID) | ORAL | 3 refills | Status: DC
Start: 2018-05-12 — End: 2018-09-09

## 2018-05-12 MED ORDER — CHLORTHALIDONE 25 MG PO TABS: 25.0000 mg | ORAL_TABLET | Freq: Every day | ORAL | 1 refills | Status: DC

## 2018-05-12 NOTE — Progress Notes (Signed)
Pt. Is here for a physical.

## 2018-05-12 NOTE — Patient Instructions (Addendum)

## 2018-05-12 NOTE — Progress Notes (Signed)
Subjective:  Patient ID: Rachel Crane, female    DOB: 1946-04-27  Age: 72 y.o. MRN: [id number]  CC: annual exam  HPI  Rachel Crane a 73 y.o.femalewith a PMH of OSA, right knee OA s/p right total knee arthroplasty and HTN presents for an annual physical exam. States she still has some right knee pain after her TKA. Left knee is also bothersome and thinks she may have to have a TKA of left knee. Has jury duty and would like a letter excusing her from jury duty. Says she would like to do jury duty but can not due to her limited ambulation s/p TKA.     Says she has been experiencing palpitations and dyspnea daily for two weeks with the exception of the last three days to include today. Denies CP, SOB, back pain, neck pain, or LUE pain. Other symptoms she has noticed is of  mucus when she drinks tea and of a sensation of "something stuck" in the left tonsil. Does not have a cold and suspects she may have allergies.     Also describes an occasional sharp pain that alternates from right side of head to left side of head. Location of pain never felt concurrently during the same day. Does not endorse paresthesias or weakness of the extremities, change in cognition, or speech changes.      Outpatient Medications Prior to Visit  Medication Sig Dispense Refill  . chlorthalidone (HYGROTON) 25 MG tablet TK SS T PO D  1  . latanoprost (XALATAN) 0.005 % ophthalmic solution INT 1 GTT IN OU QD IN THE EVE  10  . losartan (COZAAR) 100 MG tablet Take 1 tablet (100 mg total) by mouth daily. 90 tablet 3  . vitamin B-12 (CYANOCOBALAMIN) 1000 MCG tablet Take 1,000 mcg by mouth daily.    Marland Kitchen losartan (COZAAR) 100 MG tablet TAKE 1 TABLET(100 MG) BY MOUTH DAILY (Patient not taking: Reported on 04/09/2018) 90 tablet 0   No facility-administered medications prior to visit.      ROS Review of Systems  Constitutional: Negative for chills, fever and malaise/fatigue.  HENT:       Something in throat  Eyes: Negative  for blurred vision.  Respiratory: Negative for shortness of breath.   Cardiovascular: Positive for palpitations. Negative for chest pain.  Gastrointestinal: Negative for abdominal pain and nausea.  Genitourinary: Negative for dysuria and hematuria.  Musculoskeletal: Positive for joint pain. Negative for back pain, myalgias and neck pain.  Skin: Negative for rash.  Neurological: Negative for tingling and headaches.  Psychiatric/Behavioral: Negative for depression. The patient is not nervous/anxious.     Objective:  BP 114/69 (BP Location: Right Arm, Patient Position: Sitting, Cuff Size: Large)   Pulse 72   Temp 97.8 F (36.6 C) (Oral)   Resp 16   Ht 5\' 6"  (1.676 m)   Wt 208 lb (94.3 kg)   SpO2 98%   BMI 33.57 kg/m   BP/Weight 05/12/2018 04/09/2018 5/36/1443  Systolic BP 154 - 008  Diastolic BP 69 - 76  Wt. (Lbs) 208 204 204  BMI 33.57 32.93 32.93      Physical Exam  Constitutional: She is oriented to person, place, and time.  Well developed, overweight, NAD, polite  HENT:  Head: Normocephalic and atraumatic.  Left sided tonsillolith  Eyes: No scleral icterus.  Neck: Normal range of motion. Neck supple. No thyromegaly present.  Cardiovascular: Normal rate, regular rhythm, normal heart sounds and intact distal pulses. Exam reveals no gallop and  no friction rub.  No murmur heard. Pulmonary/Chest: Effort normal and breath sounds normal.  Musculoskeletal: She exhibits no edema.  Right knee with full aROM, with crepitus, and with vertical surgical scar. No edema, no erythema, no ecchymosis, or increased warmth.  Lymphadenopathy:    She has no cervical adenopathy.  Neurological: She is alert and oriented to person, place, and time.  Skin: Skin is warm and dry. No rash noted. No erythema. No pallor.  Psychiatric: Her behavior is normal. Thought content normal.  Somewhat anxious  Vitals reviewed.    Assessment & Plan:    1. Palpitations - Comprehensive metabolic  panel - CBC with Differential - Begin carvedilol (COREG) 3.125 MG tablet; Take 1 tablet (3.125 mg total) by mouth 2 (two) times daily with a meal.  Dispense: 60 tablet; Refill: 3 - EKG 12-Lead - TSH - Begin PRN nitroGLYCERIN (NITROSTAT) 0.4 MG SL tablet; Place 1 tablet (0.4 mg total) under the tongue every 5 (five) minutes as needed for chest pain.  Dispense: 50 tablet; Refill: 3 - Begin aspirin EC 81 MG tablet; Take 1 tablet (81 mg total) by mouth daily.  Dispense: 90 tablet; Refill: 3 - Ambulatory referral to Cardiology, urgent  2. Nonspecific abnormal electrocardiogram (ECG) (EKG) - nitroGLYCERIN (NITROSTAT) 0.4 MG SL tablet; Place 1 tablet (0.4 mg total) under the tongue every 5 (five) minutes as needed for chest pain.  Dispense: 50 tablet; Refill: 3 - aspirin EC 81 MG tablet; Take 1 tablet (81 mg total) by mouth daily.  Dispense: 90 tablet; Refill: 3 - Ambulatory referral to Cardiology  3. Nonintractable headache, unspecified chronicity pattern, unspecified headache type - Unclear etiology at this point. Vague symptoms. Pt advised to take Aspirin 81 mg daily.   4. Tonsillolith - Ambulatory referral to ENT  5. Need for prophylactic vaccination and inoculation against influenza - Flu Vaccine QUAD 6+ mos PF IM (Fluarix Quad PF)  6. Need for prophylactic vaccination against Streptococcus pneumoniae (pneumococcus) - pneumococcal 13-valent conjugate vaccine (PREVNAR 13) injection 0.5 mL  7. Screening for colon cancer - Fecal occult blood, imunochemical  8. Administrative encounter - Jury duty letter typed and given to patient.   9. Medication refill - chlorthalidone (HYGROTON) 25 MG tablet; Take 1 tablet (25 mg total) by mouth daily.  Dispense: 90 tablet; Refill: 1   Meds ordered this encounter  Medications  . carvedilol (COREG) 3.125 MG tablet    Sig: Take 1 tablet (3.125 mg total) by mouth 2 (two) times daily with a meal.    Dispense:  60 tablet    Refill:  3    Order  Specific Question:   Supervising Provider    Answer:   Charlott Rakes [[id number]]  . pneumococcal 13-valent conjugate vaccine (PREVNAR 13) injection 0.5 mL  . nitroGLYCERIN (NITROSTAT) 0.4 MG SL tablet    Sig: Place 1 tablet (0.4 mg total) under the tongue every 5 (five) minutes as needed for chest pain.    Dispense:  50 tablet    Refill:  3    Order Specific Question:   Supervising Provider    Answer:   Charlott Rakes [[id number]]  . aspirin EC 81 MG tablet    Sig: Take 1 tablet (81 mg total) by mouth daily.    Dispense:  90 tablet    Refill:  3    Order Specific Question:   Supervising Provider    Answer:   Charlott Rakes [[id number]]  . chlorthalidone (HYGROTON) 25 MG tablet    Sig:  Take 1 tablet (25 mg total) by mouth daily.    Dispense:  90 tablet    Refill:  1    Order Specific Question:   Supervising Provider    Answer:   Charlott Rakes [[id number]]    Follow-up: Return in about 4 weeks (around 06/09/2018) for palpitations.   Clent Demark PA

## 2018-05-13 ENCOUNTER — Other Ambulatory Visit (INDEPENDENT_AMBULATORY_CARE_PROVIDER_SITE_OTHER): Payer: Medicare Other

## 2018-05-13 ENCOUNTER — Telehealth (INDEPENDENT_AMBULATORY_CARE_PROVIDER_SITE_OTHER): Payer: Self-pay

## 2018-05-13 ENCOUNTER — Other Ambulatory Visit (INDEPENDENT_AMBULATORY_CARE_PROVIDER_SITE_OTHER): Payer: Self-pay | Admitting: Physician Assistant

## 2018-05-13 DIAGNOSIS — E876 Hypokalemia: Secondary | ICD-10-CM

## 2018-05-13 DIAGNOSIS — D649 Anemia, unspecified: Secondary | ICD-10-CM

## 2018-05-13 LAB — COMPREHENSIVE METABOLIC PANEL
ALT: 17 IU/L (ref 0–32)
AST: 18 IU/L (ref 0–40)
Albumin/Globulin Ratio: 1.5 (ref 1.2–2.2)
Albumin: 4.3 g/dL (ref 3.5–4.8)
Alkaline Phosphatase: 88 IU/L (ref 39–117)
BUN/Creatinine Ratio: 20 (ref 12–28)
BUN: 19 mg/dL (ref 8–27)
Bilirubin Total: <0.2 mg/dL (ref 0.0–1.2)
CO2: 24 mmol/L (ref 20–29)
Calcium: 9.4 mg/dL (ref 8.7–10.3)
Chloride: 101 mmol/L (ref 96–106)
Creatinine, Ser: 0.97 mg/dL (ref 0.57–1.00)
GFR calc Af Amer: 68 mL/min/{1.73_m2} (ref >59)
GFR calc non Af Amer: 59 mL/min/{1.73_m2} — ABNORMAL LOW (ref >59)
Globulin, Total: 2.9 g/dL (ref 1.5–4.5)
Glucose: 96 mg/dL (ref 65–99)
Potassium: 3.3 mmol/L — ABNORMAL LOW (ref 3.5–5.2)
Sodium: 141 mmol/L (ref 134–144)
Total Protein: 7.2 g/dL (ref 6.0–8.5)

## 2018-05-13 LAB — CBC WITH DIFFERENTIAL/PLATELET
Basophils Absolute: 0 10*3/uL (ref 0.0–0.2)
Basos: 0 %
EOS (ABSOLUTE): 0.1 10*3/uL (ref 0.0–0.4)
Eos: 2 %
Hematocrit: 33.1 % — ABNORMAL LOW (ref 34.0–46.6)
Hemoglobin: 10.8 g/dL — ABNORMAL LOW (ref 11.1–15.9)
Immature Grans (Abs): 0 10*3/uL (ref 0.0–0.1)
Immature Granulocytes: 0 %
Lymphocytes Absolute: 3.8 10*3/uL — ABNORMAL HIGH (ref 0.7–3.1)
Lymphs: 46 %
MCH: 26.7 pg (ref 26.6–33.0)
MCHC: 32.6 g/dL (ref 31.5–35.7)
MCV: 82 fL (ref 79–97)
Monocytes Absolute: 0.6 10*3/uL (ref 0.1–0.9)
Monocytes: 7 %
Neutrophils Absolute: 3.8 10*3/uL (ref 1.4–7.0)
Neutrophils: 45 %
Platelets: 220 10*3/uL (ref 150–450)
RBC: 4.05 x10E6/uL (ref 3.77–5.28)
RDW: 14.3 % (ref 12.3–15.4)
WBC: 8.3 10*3/uL (ref 3.4–10.8)

## 2018-05-13 LAB — TSH: TSH: 1.2 u[IU]/mL (ref 0.450–4.500)

## 2018-05-13 MED ORDER — POTASSIUM CHLORIDE 20 MEQ PO PACK: 20.0000 meq | PACK | Freq: Once | ORAL | 0 refills | Status: DC

## 2018-05-13 NOTE — Telephone Encounter (Signed)
-----   Message from Clent Demark, PA-C sent at 05/13/2018  8:32 AM EDT ----- Pt anemic and potassium is a little low. I have ordered lab testing and patient may come in to have lab testing done.

## 2018-05-13 NOTE — Telephone Encounter (Signed)
Patient requests 90 day supply. Nat Christen, CMA

## 2018-05-13 NOTE — Progress Notes (Signed)
Labs collected by onsite [facility] phlebotomist. Tempestt S Roberts, CMA  

## 2018-05-13 NOTE — Telephone Encounter (Signed)
Left message asking patient to return call to [facility] at [phone]. Tempestt S Roberts, CMA  

## 2018-05-13 NOTE — Telephone Encounter (Signed)
Patient is aware that she anemic and potassium is a little. She will come in this afternoon for additional testing. Rachel Crane, CMA

## 2018-05-15 DIAGNOSIS — H401131 Primary open-angle glaucoma, bilateral, mild stage: Secondary | ICD-10-CM | POA: Diagnosis not present

## 2018-05-15 LAB — HEMOGLOBINOPATHY EVALUATION
HGB C: 0 %
HGB S: 0 %
HGB VARIANT: 0 %
Hemoglobin A2 Quantitation: 2.1 % (ref 1.8–3.2)
Hemoglobin F Quantitation: 0 % (ref 0.0–2.0)
Hgb A: 97.9 % (ref 96.4–98.8)

## 2018-05-16 ENCOUNTER — Telehealth (INDEPENDENT_AMBULATORY_CARE_PROVIDER_SITE_OTHER): Payer: Self-pay

## 2018-05-16 NOTE — Telephone Encounter (Signed)
-----   Message from Clent Demark, PA-C sent at 05/16/2018 11:03 AM EDT ----- Normal hemoglobin studies. We can discuss any further symptoms or concerns at her f/u appointment.

## 2018-05-16 NOTE — Telephone Encounter (Signed)
Patient is aware that hemoglobin studies are normal and she can discuss any further symptoms or concerns at her next appointment. Nat Christen, CMA

## 2018-05-22 ENCOUNTER — Telehealth: Payer: Self-pay | Admitting: Neurology

## 2018-05-22 DIAGNOSIS — G4733 Obstructive sleep apnea (adult) (pediatric): Secondary | ICD-10-CM

## 2018-05-22 DIAGNOSIS — Z9989 Dependence on other enabling machines and devices: Secondary | ICD-10-CM

## 2018-05-22 NOTE — Telephone Encounter (Addendum)
I called pt. She reports that last night she had trouble sleeping. She could not get comfortable with the cpap. She is asking for a sooner appt with Dr. Rexene Alberts to discuss her sleeping difficulties with the cpap. Pt says that she has been having some head pains as well. I asked to proceed to the ER for severe head pain or for any stroke like symptoms and I reviewed stroke like symptoms with the pt. Pt is requesting Dr. Rexene Alberts and not an NP for now. Right now pt has the soonest appt available but if something sooner opens up I will call her. Pt says that she called [facility] for cpap supplies but [facility] needs an order from [place]. I advised her that I will send this order to [facility] today. Pt verbalized understanding. Order for cpap supplies sent to [facility] via community message in EPIC. Confirmation received that the order transmitted was successful.

## 2018-05-22 NOTE — Telephone Encounter (Signed)
Patient is schedule to see Jinny Blossom on 12/15/18 but she stated she wanted to see Dr. Rexene Alberts sooner than that because she is still having the same problems she had when she first saw Dr. Rexene Alberts. I put her down for December 3 and put her on the waiting list.

## 2018-05-22 NOTE — Telephone Encounter (Signed)
Noted, thanks!

## 2018-05-22 NOTE — Addendum Note (Signed)
Addended by: Lester Waskom A on: 05/22/2018 03:19 PM   Modules accepted: Orders

## 2018-05-22 NOTE — Telephone Encounter (Signed)
Pt states she she did not sleep well last night.  Pt is asking for a prescription for Cpap, head gear mask for nose and filter.  She is asking this be sent to [place]

## 2018-05-26 ENCOUNTER — Encounter: Payer: Self-pay | Admitting: Adult Health

## 2018-05-26 NOTE — Telephone Encounter (Signed)
I called pt and offered her an appt with Dr. Rexene Alberts today at 10:30am. Pt cannot come to this appt. I will keep watching out for other cancellations. Pt verbalized understanding.

## 2018-05-26 NOTE — Telephone Encounter (Signed)
I called pt to offer her the 2:30 opening for today's schedule with Dr. Rexene Alberts. No answer, left a message asking her to call me back. If pt calls back and this spot is still available please offer it to her.

## 2018-05-27 NOTE — Telephone Encounter (Signed)
Patient is returning your call.  

## 2018-05-27 NOTE — Telephone Encounter (Signed)
I called pt, she reports that she is snoring while using cpap and is still having problems getting comfortable and sleeping with the cpap. She changed her mind and is agreeable to seeing the NP about her cpap. I offered her 2 appts with Jinny Blossom, NP tomorrow and she decided on the 1:30pm on 05/28/18. I asked pt to bring her cpap to this visit. Pt verbalized understanding and appreciation.

## 2018-05-28 ENCOUNTER — Ambulatory Visit (INDEPENDENT_AMBULATORY_CARE_PROVIDER_SITE_OTHER): Payer: Medicare Other | Admitting: Adult Health

## 2018-05-28 ENCOUNTER — Encounter: Payer: Self-pay | Admitting: Adult Health

## 2018-05-28 VITALS — BP 144/64 | HR 72 | Ht 66.0 in | Wt 204.0 lb

## 2018-05-28 DIAGNOSIS — Z9989 Dependence on other enabling machines and devices: Secondary | ICD-10-CM | POA: Diagnosis not present

## 2018-05-28 DIAGNOSIS — G4733 Obstructive sleep apnea (adult) (pediatric): Secondary | ICD-10-CM | POA: Diagnosis not present

## 2018-05-28 NOTE — Patient Instructions (Addendum)
Your Plan:  Continue using CPAP nightly and greater than 4 hours each night If your symptoms worsen or you develop new symptoms please let us know.   Please call [facility] at [phone], extension [phone] to discuss your concerns. Their customer service representatives will be glad to assist you. If they do not answer, please leave a message and they will call you back. Make sure to leave your name and return phone number. If you do not get a response from them in 3 business days, please let our office know.   Thank you for coming to see [place] at [facility]. I hope we have been able to provide you high quality care today.  You may receive a patient satisfaction survey over the next few weeks. We would appreciate your feedback and comments so that we may continue to improve ourselves and the health of our patients.

## 2018-05-28 NOTE — Progress Notes (Signed)
I have read the note, and I agree with the clinical assessment and plan.  Layman Gully K Alix Stowers   

## 2018-05-28 NOTE — Progress Notes (Signed)
Community message sent via Epic to [facility], re: order for new CPAP supplies.

## 2018-05-28 NOTE — Progress Notes (Signed)
PATIENT: Rachel Crane DOB: Nov 14, 1945  REASON FOR VISIT: follow up HISTORY FROM: patient  HISTORY OF PRESENT ILLNESS: Today 05/28/18 :  Rachel Crane is a 72 year old female with a history of obstructive sleep apnea on CPAP.  She returns today for follow-up.  Her download indicates that she use her machine 30 out of 30 days for compliance of 100%.  She used her machine greater than 4 hours 27 days for compliance of 90%.  She used her machine on average 7 hours and 17 minutes.  Her residual AHI is 5.8 on 8 cm of water with EPR 2.  She states that there are some nights she forgets to put it on as soon as she falls asleep and ends up putting it on later.  She has been having trouble getting new supplies from her DME company.  She returns today for evaluation.  HISTORY (copied from Dr. Guadelupe Sabin present in the days for 7 hours each night pressures at a) 12/12/2017: I reviewed her CPAP compliance data from 11/11/2017 through 12/10/2017 which is a total of 30 days, during which time she used her CPAP 29 days with percent used days greater than 4 hours at 97%, indicating excellent compliance with an average usage of 7 hours and 9 minutes, residual AHI at goal at 1.9 per hour, leak acceptable with the 95th percentile at 14.5 L/m on a pressure of 8 cm with EPR of 2. She reportsdoing well with her CPAP. Has had a little bit of fluctuation in her weight by a few pounds up and down, she had knee replacement surgery on the right in the interim with good results thus far, she has physical therapy currently outpatient twice a week. She is motivated to exercise more and try to lose weight. She is willing to continue with CPAP therapy, headaches are essentially gone. Her children are scattered a little bit, she has a son in [place], she visited him recently and forgot her CPAP machine that the only time she missed a night recently. She has a daughter who is a physician in [place], one daughter is a school principal and lives  in [place], 1 daughter in [place] works for [employer]. She does not drive long distance. Her kids typically come and visit or take her and bring her back. her blood pressure Is generally under control   REVIEW OF SYSTEMS: Out of a complete 14 system review of symptoms, the patient complains only of the following symptoms, and all other reviewed systems are negative.  See HPI   ALLERGIES: No Known Allergies  HOME MEDICATIONS: Outpatient Medications Prior to Visit  Medication Sig Dispense Refill  . aspirin EC 81 MG tablet Take 1 tablet (81 mg total) by mouth daily. 90 tablet 3  . chlorthalidone (HYGROTON) 25 MG tablet Take 1 tablet (25 mg total) by mouth daily. 90 tablet 1  . Cholecalciferol (VITAMIN D3) 50 MCG (2000 UT) TABS Take by mouth.    . latanoprost (XALATAN) 0.005 % ophthalmic solution INT 1 GTT IN OU QD IN THE EVE  10  . losartan (COZAAR) 100 MG tablet Take 100 mg by mouth daily.    . nitroGLYCERIN (NITROSTAT) 0.4 MG SL tablet Place 1 tablet (0.4 mg total) under the tongue every 5 (five) minutes as needed for chest pain. 50 tablet 3  . potassium chloride (KLOR-CON) 20 MEQ packet Take 20 mEq by mouth once for 1 dose. 30 tablet 0  . vitamin B-12 (CYANOCOBALAMIN) 1000 MCG tablet Take 1,000  mcg by mouth daily.    . carvedilol (COREG) 3.125 MG tablet Take 1 tablet (3.125 mg total) by mouth 2 (two) times daily with a meal. (Patient not taking: Reported on 05/28/2018) 60 tablet 3   No facility-administered medications prior to visit.     PAST MEDICAL HISTORY: Past Medical History:  Diagnosis Date  . Arthritis   . Headache    H/O bad HA, since using CPAP- she has had improvement with that issue   . Hypertension   . OSA on CPAP    settting - 8, uses CPAP q night     PAST SURGICAL HISTORY: Past Surgical History:  Procedure Laterality Date  . CHOLECYSTECTOMY  1998  . TOTAL KNEE ARTHROPLASTY Right 09/19/2017   Procedure: RIGHT TOTAL KNEE ARTHROPLASTY;  Surgeon: Leandrew Koyanagi, MD;  Location: [place];  Service: Orthopedics;  Laterality: Right;  . TUBAL LIGATION      FAMILY HISTORY: Family History  Problem Relation Age of Onset  . Breast cancer Sister     SOCIAL HISTORY: Social History   Socioeconomic History  . Marital status: Widowed    Spouse name: Not on file  . Number of children: Not on file  . Years of education: Not on file  . Highest education level: Not on file  Occupational History  . Not on file  Social Needs  . Financial resource strain: Not on file  . Food insecurity:    Worry: Not on file    Inability: Not on file  . Transportation needs:    Medical: Not on file    Non-medical: Not on file  Tobacco Use  . Smoking status: Never Smoker  . Smokeless tobacco: Never Used  Substance and Sexual Activity  . Alcohol use: No  . Drug use: No  . Sexual activity: Not Currently  Lifestyle  . Physical activity:    Days per week: Not on file    Minutes per session: Not on file  . Stress: Not on file  Relationships  . Social connections:    Talks on phone: Not on file    Gets together: Not on file    Attends religious service: Not on file    Active member of club or organization: Not on file    Attends meetings of clubs or organizations: Not on file    Relationship status: Not on file  . Intimate partner violence:    Fear of current or ex partner: Not on file    Emotionally abused: Not on file    Physically abused: Not on file    Forced sexual activity: Not on file  Other Topics Concern  . Not on file  Social History Narrative  . Not on file      PHYSICAL EXAM  Vitals:   05/28/18 1328  BP: (!) 144/64  Pulse: 72  Weight: 204 lb (92.5 kg)  Height: 5\' 6"  (1.676 m)   Body mass index is 32.93 kg/m.  Generalized: Well developed, in no acute distress   Neurological examination  Mentation: Alert oriented to time, place, history taking. Follows all commands speech and language fluent Cranial nerve II-XII:  Extraocular  movements were full, visual field were full on confrontational test. Facial sensation and strength were normal. Uvula tongue midline. Head turning and shoulder shrug  were normal and symmetric.  Neck circumference 15 inches, Mallampati 4+ Motor: The motor testing reveals 5 over 5 strength of all 4 extremities. Good symmetric motor tone is noted throughout.  Sensory: Sensory testing is intact to soft touch on all 4 extremities. No evidence of extinction is noted.  Coordination: Cerebellar testing reveals good finger-nose-finger and heel-to-shin bilaterally.  Gait and station: Gait is normal.    DIAGNOSTIC DATA (LABS, IMAGING, TESTING) - I reviewed patient records, labs, notes, testing and imaging myself where available.  Lab Results  Component Value Date   WBC 8.3 05/12/2018   HGB 10.8 (L) 05/12/2018   HCT 33.1 (L) 05/12/2018   MCV 82 05/12/2018   PLT 220 05/12/2018      Component Value Date/Time   NA 141 05/12/2018 1557   K 3.3 (L) 05/12/2018 1557   CL 101 05/12/2018 1557   CO2 24 05/12/2018 1557   GLUCOSE 96 05/12/2018 1557   GLUCOSE 109 (H) 09/20/2017 0445   BUN 19 05/12/2018 1557   CREATININE 0.97 05/12/2018 1557   CALCIUM 9.4 05/12/2018 1557   PROT 7.2 05/12/2018 1557   ALBUMIN 4.3 05/12/2018 1557   AST 18 05/12/2018 1557   ALT 17 05/12/2018 1557   ALKPHOS 88 05/12/2018 1557   BILITOT <0.2 05/12/2018 1557   GFRNONAA 59 (L) 05/12/2018 1557   GFRAA 68 05/12/2018 1557   Lab Results  Component Value Date   CHOL 189 02/22/2017   HDL 49 02/22/2017   LDLCALC 124 (H) 02/22/2017   TRIG 78 02/22/2017   CHOLHDL 3.9 02/22/2017   No results found for: HGBA1C No results found for: VITAMINB12 Lab Results  Component Value Date   TSH 1.200 05/12/2018      ASSESSMENT AND PLAN 72 y.o. year old female  has a past medical history of Arthritis, Headache, Hypertension, and OSA on CPAP. here with :  1.  Obstructive sleep apnea on CPAP  The patient CPAP download shows excellent  compliance and good treatment of her apnea.  She is encouraged to continue using the CPAP nightly and greater than 4 hours each night.  She is advised that if her symptoms worsen or she develops new symptoms she should let us know.  An order was sent to her DME company for new supplies.  She returns today for an evaluation.   I spent 15 minutes with the patient. 50% of this time was spent reviewing CPAP download   Ward Givens, MSN, NP-C 05/28/2018, 1:44 PM [facility] [address] [place],  [address] [phone]

## 2018-06-01 ENCOUNTER — Other Ambulatory Visit (INDEPENDENT_AMBULATORY_CARE_PROVIDER_SITE_OTHER): Payer: Self-pay | Admitting: Physician Assistant

## 2018-06-02 NOTE — Telephone Encounter (Signed)
FWD to PCP.  S , CMA  

## 2018-06-04 DIAGNOSIS — R9431 Abnormal electrocardiogram [ECG] [EKG]: Secondary | ICD-10-CM | POA: Diagnosis not present

## 2018-06-04 DIAGNOSIS — R002 Palpitations: Secondary | ICD-10-CM | POA: Diagnosis not present

## 2018-06-04 DIAGNOSIS — K219 Gastro-esophageal reflux disease without esophagitis: Secondary | ICD-10-CM | POA: Diagnosis not present

## 2018-06-04 DIAGNOSIS — I10 Essential (primary) hypertension: Secondary | ICD-10-CM | POA: Diagnosis not present

## 2018-06-10 ENCOUNTER — Ambulatory Visit: Payer: Medicare Other | Admitting: Cardiology

## 2018-06-11 ENCOUNTER — Ambulatory Visit (INDEPENDENT_AMBULATORY_CARE_PROVIDER_SITE_OTHER): Payer: Medicare Other | Admitting: Physician Assistant

## 2018-06-17 ENCOUNTER — Encounter

## 2018-06-17 ENCOUNTER — Ambulatory Visit: Payer: Medicare Other | Admitting: Neurology

## 2018-06-23 ENCOUNTER — Ambulatory Visit (INDEPENDENT_AMBULATORY_CARE_PROVIDER_SITE_OTHER): Payer: Medicare Other | Admitting: Physician Assistant

## 2018-06-23 ENCOUNTER — Encounter (INDEPENDENT_AMBULATORY_CARE_PROVIDER_SITE_OTHER): Payer: Self-pay | Admitting: Physician Assistant

## 2018-06-23 ENCOUNTER — Other Ambulatory Visit: Payer: Self-pay

## 2018-06-23 VITALS — BP 135/68 | HR 64 | Temp 98.2°F | Ht 66.0 in | Wt 208.2 lb

## 2018-06-23 DIAGNOSIS — K219 Gastro-esophageal reflux disease without esophagitis: Secondary | ICD-10-CM

## 2018-06-23 DIAGNOSIS — E538 Deficiency of other specified B group vitamins: Secondary | ICD-10-CM

## 2018-06-23 DIAGNOSIS — Z1211 Encounter for screening for malignant neoplasm of colon: Secondary | ICD-10-CM | POA: Diagnosis not present

## 2018-06-23 DIAGNOSIS — R002 Palpitations: Secondary | ICD-10-CM

## 2018-06-23 MED ORDER — OMEPRAZOLE 40 MG PO CPDR: 40.0000 mg | DELAYED_RELEASE_CAPSULE | Freq: Every day | ORAL | 3 refills | Status: DC

## 2018-06-23 NOTE — Progress Notes (Signed)
Subjective:  Patient ID: Rachel Crane, female    DOB: [birth year]  Age: 72 y.o. MRN: [id number]  CC: f/u palpitations  HPI Rachel Crane a 72 y.o.femalewith a PMH of OSA, right knee OA s/p right total knee arthroplasty and HTN presentsto f/u on palpitations. She had an EKG done approximately six weeks ago which revealed inverted T waves on V1, V2, V3. Patient prescribed nitroglycerine, carvedilol, and an urgent cardiology referral was made. Pt was called to make appointment for cardiology assessment but she declined due to the cardiology office being too far away for her. She subsequently called her old cardiologist and made an appointment. Was seen 06/04/18. Reports having another EKG done which was normal. Says the cardiologist told her to return only if she felt chest pain. Pt stopped taking carvedilol 3.125 mg because she felt too fatigued while taking it. Has not had chest pain and therefore has not had to take nitroglycerin. Also denies having palpitations. Has taken PPI for acid reflux and would like a refill. Does not endorse any other symptoms besides chronic knee pain bilaterally for which she is already managed by orthopedics. Would like to know if she should continue taking Aspirin and if she should resume Vit B12 1000 mcg that she had been taking for many years.      Outpatient Medications Prior to Visit  Medication Sig Dispense Refill  . aspirin EC 81 MG tablet Take 1 tablet (81 mg total) by mouth daily. 90 tablet 3  . chlorthalidone (HYGROTON) 25 MG tablet Take 1 tablet (25 mg total) by mouth daily. 90 tablet 1  . Cholecalciferol (VITAMIN D3) 50 MCG (2000 UT) TABS Take by mouth.    . latanoprost (XALATAN) 0.005 % ophthalmic solution INT 1 GTT IN OU QD IN THE EVE  10  . losartan (COZAAR) 100 MG tablet Take 100 mg by mouth daily.    Marland Kitchen triamcinolone cream (KENALOG) 0.1 % APPLY TO THE AFFECTED AREA TWICE DAILY 454 g 0  . vitamin B-12 (CYANOCOBALAMIN) 1000 MCG tablet Take 1,000 mcg by  mouth daily.    . carvedilol (COREG) 3.125 MG tablet Take 1 tablet (3.125 mg total) by mouth 2 (two) times daily with a meal. (Patient not taking: Reported on 05/28/2018) 60 tablet 3  . nitroGLYCERIN (NITROSTAT) 0.4 MG SL tablet Place 1 tablet (0.4 mg total) under the tongue every 5 (five) minutes as needed for chest pain. (Patient not taking: Reported on 06/23/2018) 50 tablet 3  . potassium chloride (KLOR-CON) 20 MEQ packet Take 20 mEq by mouth once for 1 dose. 30 tablet 0   No facility-administered medications prior to visit.      ROS Review of Systems  Constitutional: Negative for chills, fever and malaise/fatigue.  Eyes: Negative for blurred vision.  Respiratory: Negative for shortness of breath.   Cardiovascular: Negative for chest pain and palpitations.  Gastrointestinal: Negative for abdominal pain and nausea.  Genitourinary: Negative for dysuria and hematuria.  Musculoskeletal: Negative for joint pain and myalgias.  Skin: Negative for rash.  Neurological: Negative for tingling and headaches.  Psychiatric/Behavioral: Negative for depression. The patient is not nervous/anxious.     Objective:  BP 135/68 (BP Location: Right Arm, Patient Position: Sitting, Cuff Size: Large)   Pulse 64   Temp 98.2 F (36.8 C) (Oral)   Ht 5\' 6"  (1.676 m)   Wt 208 lb 3.2 oz (94.4 kg)   SpO2 97%   BMI 33.60 kg/m   BP/Weight 06/23/2018 05/28/2018 93/79/0240  Systolic BP  466 599 357  Diastolic BP 68 64 69  Wt. (Lbs) 208.2 204 208  BMI 33.6 32.93 33.57      Physical Exam  Constitutional: She is oriented to person, place, and time.  Well developed, well nourished, NAD, polite  HENT:  Head: Normocephalic and atraumatic.  Eyes: No scleral icterus.  Neck: Normal range of motion. Neck supple. No thyromegaly present.  Cardiovascular: Normal rate, regular rhythm and normal heart sounds.  No LE edema bilaterally  Pulmonary/Chest: Effort normal and breath sounds normal.  Musculoskeletal: She  exhibits no edema.  Neurological: She is alert and oriented to person, place, and time.  Skin: Skin is warm and dry. No rash noted. No erythema. No pallor.  Psychiatric: She has a normal mood and affect. Her behavior is normal. Thought content normal.  Vitals reviewed.    Assessment & Plan:   1. Palpitations - Reports having gone to [facility] cardiology and having repeat EKG which was normal. Pt stopped taking carvedilol on her own accord due to feeling fatigued. Denies symptoms. I have advised her to continue management with cardiologist and to keep nitroglycerine tablets on her in case of chest pain.   2. Vitamin B12 deficiency - Vitamin B12  3. Gastroesophageal reflux disease, esophagitis presence not specified - omeprazole (PRILOSEC) 40 MG capsule; Take 1 capsule (40 mg total) by mouth daily.  Dispense: 30 capsule; Refill: 3   Meds ordered this encounter  Medications  . omeprazole (PRILOSEC) 40 MG capsule    Sig: Take 1 capsule (40 mg total) by mouth daily.    Dispense:  30 capsule    Refill:  3    Order Specific Question:   Supervising Provider    Answer:   Charlott Rakes [[id number]]    Follow-up: Return in about 12 weeks (around 09/15/2018).   Clent Demark PA

## 2018-06-23 NOTE — Patient Instructions (Signed)
Food Choices for Gastroesophageal Reflux Disease, Adult When you have gastroesophageal reflux disease (GERD), the foods you eat and your eating habits are very important. Choosing the right foods can help ease your discomfort. What guidelines do I need to follow?  Choose fruits, vegetables, whole grains, and low-fat dairy products.  Choose low-fat meat, fish, and poultry.  Limit fats such as oils, salad dressings, butter, nuts, and avocado.  Keep a food diary. This helps you identify foods that cause symptoms.  Avoid foods that cause symptoms. These may be different for everyone.  Eat small meals often instead of 3 large meals a day.  Eat your meals slowly, in a place where you are relaxed.  Limit fried foods.  Cook foods using methods other than frying.  Avoid drinking alcohol.  Avoid drinking large amounts of liquids with your meals.  Avoid bending over or lying down until 2-3 hours after eating. What foods are not recommended? These are some foods and drinks that may make your symptoms worse: Vegetables  Tomatoes. Tomato juice. Tomato and spaghetti sauce. Chili peppers. Onion and garlic. Horseradish. Fruits  Oranges, grapefruit, and lemon (fruit and juice). Meats  High-fat meats, fish, and poultry. This includes hot dogs, ribs, ham, sausage, salami, and bacon. Dairy  Whole milk and chocolate milk. Sour cream. Cream. Butter. Ice cream. Cream cheese. Drinks  Coffee and tea. Bubbly (carbonated) drinks or energy drinks. Condiments  Hot sauce. Barbecue sauce. Sweets/Desserts  Chocolate and cocoa. Donuts. Peppermint and spearmint. Fats and Oils  High-fat foods. This includes French fries and potato chips. Other  Vinegar. Strong spices. This includes black pepper, white pepper, red pepper, cayenne, curry powder, cloves, ginger, and chili powder. The items listed above may not be a complete list of foods and drinks to avoid. Contact your dietitian for more information.    This information is not intended to replace advice given to you by your health care provider. Make sure you discuss any questions you have with your health care provider. Document Released: 01/01/2012 Document Revised: 12/08/2015 Document Reviewed: 05/06/2013 Elsevier Interactive Patient Education  2017 Elsevier Inc.  

## 2018-06-24 ENCOUNTER — Telehealth (INDEPENDENT_AMBULATORY_CARE_PROVIDER_SITE_OTHER): Payer: Self-pay

## 2018-06-24 LAB — VITAMIN B12: Vitamin B-12: 721 pg/mL (ref 232–1245)

## 2018-06-24 NOTE — Telephone Encounter (Signed)
-----   Message from Clent Demark, PA-C sent at 06/24/2018 12:05 PM EST ----- Vit B12 is at normal level. She does not need to resume Vit B12 supplement.

## 2018-06-24 NOTE — Telephone Encounter (Signed)
Patient is aware that B12 is normal and no need to take B12 supplements as of now. Nat Christen, CMA

## 2018-06-25 ENCOUNTER — Telehealth (INDEPENDENT_AMBULATORY_CARE_PROVIDER_SITE_OTHER): Payer: Self-pay

## 2018-06-25 LAB — FECAL OCCULT BLOOD, IMMUNOCHEMICAL: Fecal Occult Bld: NEGATIVE

## 2018-06-25 NOTE — Telephone Encounter (Signed)
-----   Message from Clent Demark, PA-C sent at 06/25/2018 12:38 PM EST ----- FIT negative.

## 2018-06-25 NOTE — Telephone Encounter (Signed)
Patient is aware that FIT is negative. Nat Christen, CMA

## 2018-07-28 ENCOUNTER — Telehealth: Payer: Self-pay

## 2018-07-28 DIAGNOSIS — G4733 Obstructive sleep apnea (adult) (pediatric): Secondary | ICD-10-CM

## 2018-07-28 NOTE — Telephone Encounter (Signed)
New CPAP order placed. Not sure, if she will need FU within 90 days?

## 2018-07-28 NOTE — Telephone Encounter (Signed)
Received this notice from [facility]: "Thanks I have pulled this order and no since this one is not billing to insurance she will not need the 31-90 day follow up appt."

## 2018-07-28 NOTE — Telephone Encounter (Signed)
Received this notice from [facility]: "This pt has contacted [place] stating her cpap was lost. She said that it was left in a taxi service [place] at airport and cannot be located.   Her current unit was at a pressure of 8cm based on orders we received back in November 2018.  Patient has agreed to private pay purchase a new unit as a replacement will not be covered through insurance. However in order to do so we need a new rx with pressure setting.   If Dr. Rexene Alberts or NP agrees can you please let me know when the order is ready. Thanks."

## 2018-07-28 NOTE — Telephone Encounter (Signed)
Order sent to [facility] and I asked if she will need a 31-90 follow up.

## 2018-09-09 ENCOUNTER — Ambulatory Visit (INDEPENDENT_AMBULATORY_CARE_PROVIDER_SITE_OTHER): Payer: Medicare Other | Admitting: Primary Care

## 2018-09-09 ENCOUNTER — Other Ambulatory Visit: Payer: Self-pay

## 2018-09-09 ENCOUNTER — Encounter (INDEPENDENT_AMBULATORY_CARE_PROVIDER_SITE_OTHER): Payer: Self-pay | Admitting: Primary Care

## 2018-09-09 VITALS — BP 123/74 | HR 84 | Temp 98.8°F | Ht 66.0 in | Wt 204.0 lb

## 2018-09-09 DIAGNOSIS — R002 Palpitations: Secondary | ICD-10-CM

## 2018-09-09 DIAGNOSIS — R058 Other specified cough: Secondary | ICD-10-CM

## 2018-09-09 DIAGNOSIS — R05 Cough: Secondary | ICD-10-CM | POA: Diagnosis not present

## 2018-09-09 DIAGNOSIS — I10 Essential (primary) hypertension: Secondary | ICD-10-CM | POA: Diagnosis not present

## 2018-09-09 DIAGNOSIS — Z760 Encounter for issue of repeat prescription: Secondary | ICD-10-CM

## 2018-09-09 DIAGNOSIS — K219 Gastro-esophageal reflux disease without esophagitis: Secondary | ICD-10-CM | POA: Diagnosis not present

## 2018-09-09 MED ORDER — IBUPROFEN 600 MG PO TABS: 600.0000 mg | ORAL_TABLET | Freq: Three times a day (TID) | ORAL | 0 refills | Status: DC | PRN

## 2018-09-09 MED ORDER — CARVEDILOL 3.125 MG PO TABS: 3.1250 mg | ORAL_TABLET | Freq: Two times a day (BID) | ORAL | 3 refills | Status: DC

## 2018-09-09 MED ORDER — OMEPRAZOLE 40 MG PO CPDR: 40.0000 mg | DELAYED_RELEASE_CAPSULE | Freq: Every day | ORAL | 3 refills | Status: DC

## 2018-09-09 MED ORDER — GUAIFENESIN ER 600 MG PO TB12: 600.0000 mg | ORAL_TABLET | Freq: Two times a day (BID) | ORAL | 1 refills | Status: DC

## 2018-09-09 MED ORDER — CHLORTHALIDONE 25 MG PO TABS: 25.0000 mg | ORAL_TABLET | Freq: Every day | ORAL | 1 refills | Status: DC

## 2018-09-09 MED ORDER — LOSARTAN POTASSIUM 100 MG PO TABS: 100.0000 mg | ORAL_TABLET | Freq: Every day | ORAL | 3 refills | Status: DC

## 2018-09-09 NOTE — Patient Instructions (Signed)

## 2018-09-09 NOTE — Progress Notes (Signed)
Established Patient Office Visit  Subjective:  Patient ID: Rachel Crane, female    DOB: 1946-06-01  Age: 73 y.o. MRN: [id number]  CC:  Chief Complaint  Patient presents with  . Follow-up    palpitations     HPI [name] presents for follow up on palpitations, seen by cardiologist Dr. Einar Gip Nov.2019 and was prescribed Coreg but did not have any refills and s/s returned. She has a PMH of HTN, GERD and OSA.  Past Medical History:  Diagnosis Date  . Arthritis   . Headache    H/O bad HA, since using CPAP- she has had improvement with that issue   . Hypertension   . OSA on CPAP    settting - 8, uses CPAP q night     Past Surgical History:  Procedure Laterality Date  . CHOLECYSTECTOMY  1998  . TOTAL KNEE ARTHROPLASTY Right 09/19/2017   Procedure: RIGHT TOTAL KNEE ARTHROPLASTY;  Surgeon: Leandrew Koyanagi, MD;  Location: [place];  Service: Orthopedics;  Laterality: Right;  . TUBAL LIGATION      Family History  Problem Relation Age of Onset  . Breast cancer Sister     Social History   Socioeconomic History  . Marital status: Widowed    Spouse name: Not on file  . Number of children: Not on file  . Years of education: Not on file  . Highest education level: Not on file  Occupational History  . Not on file  Social Needs  . Financial resource strain: Not on file  . Food insecurity:    Worry: Not on file    Inability: Not on file  . Transportation needs:    Medical: Not on file    Non-medical: Not on file  Tobacco Use  . Smoking status: Never Smoker  . Smokeless tobacco: Never Used  Substance and Sexual Activity  . Alcohol use: No  . Drug use: No  . Sexual activity: Not Currently  Lifestyle  . Physical activity:    Days per week: Not on file    Minutes per session: Not on file  . Stress: Not on file  Relationships  . Social connections:    Talks on phone: Not on file    Gets together: Not on file    Attends religious service: Not on file    Active member of club  or organization: Not on file    Attends meetings of clubs or organizations: Not on file    Relationship status: Not on file  . Intimate partner violence:    Fear of current or ex partner: Not on file    Emotionally abused: Not on file    Physically abused: Not on file    Forced sexual activity: Not on file  Other Topics Concern  . Not on file  Social History Narrative  . Not on file    Outpatient Medications Prior to Visit  Medication Sig Dispense Refill  . aspirin EC 81 MG tablet Take 1 tablet (81 mg total) by mouth daily. 90 tablet 3  . carvedilol (COREG) 3.125 MG tablet Take 1 tablet (3.125 mg total) by mouth 2 (two) times daily with a meal. 60 tablet 3  . chlorthalidone (HYGROTON) 25 MG tablet Take 1 tablet (25 mg total) by mouth daily. 90 tablet 1  . Cholecalciferol (VITAMIN D3) 50 MCG (2000 UT) TABS Take by mouth.    . latanoprost (XALATAN) 0.005 % ophthalmic solution INT 1 GTT IN OU QD IN THE EVE  10  . triamcinolone cream (KENALOG) 0.1 % APPLY TO THE AFFECTED AREA TWICE DAILY 454 g 0  . losartan (COZAAR) 100 MG tablet Take 100 mg by mouth daily.    . nitroGLYCERIN (NITROSTAT) 0.4 MG SL tablet Place 1 tablet (0.4 mg total) under the tongue every 5 (five) minutes as needed for chest pain. (Patient not taking: Reported on 06/23/2018) 50 tablet 3  . omeprazole (PRILOSEC) 40 MG capsule Take 1 capsule (40 mg total) by mouth daily. 30 capsule 3  . potassium chloride (KLOR-CON) 20 MEQ packet Take 20 mEq by mouth once for 1 dose. 30 tablet 0  . vitamin B-12 (CYANOCOBALAMIN) 1000 MCG tablet Take 1,000 mcg by mouth daily.     No facility-administered medications prior to visit.     No Known Allergies  ROS Review of Systems  Constitutional: Negative.   HENT: Negative.   Eyes: Negative.   Respiratory: Negative.   Gastrointestinal: Positive for abdominal distention.  Endocrine: Negative.   Genitourinary: Negative.   Musculoskeletal: Positive for arthralgias.  Skin: Negative.    Allergic/Immunologic: Negative.   Neurological: Negative.   Hematological: Negative.   Psychiatric/Behavioral: Negative.       Objective:    Physical Exam  Constitutional: She is oriented to person, place, and time. She appears well-developed and well-nourished.  HENT:  Head: Normocephalic.  Eyes: Pupils are equal, round, and reactive to light. EOM are normal.  Neck: Normal range of motion. Neck supple.  Cardiovascular: Normal rate and regular rhythm.  Pulmonary/Chest: Effort normal and breath sounds normal.  Abdominal: Soft. Bowel sounds are normal.  Musculoskeletal: Normal range of motion.  Neurological: She is alert and oriented to person, place, and time.  Skin: Skin is warm.  Psychiatric: She has a normal mood and affect.    BP 123/74 (BP Location: Right Arm, Patient Position: Sitting, Cuff Size: Large)   Pulse 84   Temp 98.8 F (37.1 C) (Oral)   Ht 5\' 6"  (1.676 m)   Wt 204 lb (92.5 kg)   SpO2 95%   BMI 32.93 kg/m  Wt Readings from Last 3 Encounters:  09/09/18 204 lb (92.5 kg)  06/23/18 208 lb 3.2 oz (94.4 kg)  05/28/18 204 lb (92.5 kg)     There are no preventive care reminders to display for this patient.  There are no preventive care reminders to display for this patient.  Lab Results  Component Value Date   TSH 1.200 05/12/2018   Lab Results  Component Value Date   WBC 8.3 05/12/2018   HGB 10.8 (L) 05/12/2018   HCT 33.1 (L) 05/12/2018   MCV 82 05/12/2018   PLT 220 05/12/2018   Lab Results  Component Value Date   NA 141 05/12/2018   K 3.3 (L) 05/12/2018   CO2 24 05/12/2018   GLUCOSE 96 05/12/2018   BUN 19 05/12/2018   CREATININE 0.97 05/12/2018   BILITOT <0.2 05/12/2018   ALKPHOS 88 05/12/2018   AST 18 05/12/2018   ALT 17 05/12/2018   PROT 7.2 05/12/2018   ALBUMIN 4.3 05/12/2018   CALCIUM 9.4 05/12/2018   ANIONGAP 10 09/20/2017   Lab Results  Component Value Date   CHOL 189 02/22/2017   Lab Results  Component Value Date   HDL 49  02/22/2017   Lab Results  Component Value Date   LDLCALC 124 (H) 02/22/2017   Lab Results  Component Value Date   TRIG 78 02/22/2017   Lab Results  Component Value Date   CHOLHDL  3.9 02/22/2017   No results found for: HGBA1C    Assessment & Plan:   Problem List Items Addressed This Visit    None    Visit Diagnoses    Palpitations    -  Primary spoke with cardiologist Dr. Einar Gip refill her coreg    Gastroesophageal reflux disease, esophagitis presence not specified       Essential hypertension    wnl   Sputum production       Relevant Medications   guaiFENesin (MUCINEX) 600 MG 12 hr tablet      Meds ordered this encounter  Medications  . guaiFENesin (MUCINEX) 600 MG 12 hr tablet    Sig: Take 1 tablet (600 mg total) by mouth 2 (two) times daily.    Dispense:  30 tablet    Refill:  1    Follow-up: Return in about 6 months (around 03/10/2019) for HTN and GERD.    [name], NP

## 2018-10-02 ENCOUNTER — Telehealth: Payer: Self-pay

## 2018-10-02 NOTE — Telephone Encounter (Signed)
Pt called c/o having palps and it has been going on a few days; Some sob

## 2018-10-02 NOTE — Telephone Encounter (Signed)
Can make appt.

## 2018-10-03 ENCOUNTER — Encounter (INDEPENDENT_AMBULATORY_CARE_PROVIDER_SITE_OTHER): Payer: Self-pay | Admitting: Orthopaedic Surgery

## 2018-10-03 ENCOUNTER — Ambulatory Visit (INDEPENDENT_AMBULATORY_CARE_PROVIDER_SITE_OTHER): Payer: Medicare Other

## 2018-10-03 ENCOUNTER — Ambulatory Visit (INDEPENDENT_AMBULATORY_CARE_PROVIDER_SITE_OTHER): Payer: Medicare Other | Admitting: Orthopaedic Surgery

## 2018-10-03 ENCOUNTER — Other Ambulatory Visit: Payer: Self-pay

## 2018-10-03 DIAGNOSIS — Z96651 Presence of right artificial knee joint: Secondary | ICD-10-CM

## 2018-10-03 DIAGNOSIS — M25761 Osteophyte, right knee: Secondary | ICD-10-CM

## 2018-10-03 DIAGNOSIS — M25561 Pain in right knee: Secondary | ICD-10-CM

## 2018-10-03 DIAGNOSIS — G8929 Other chronic pain: Secondary | ICD-10-CM

## 2018-10-03 NOTE — Progress Notes (Signed)
Office Visit Note   Patient: Keina Mutch           Date of Birth: 04-Apr-1946           MRN: [id number] Visit Date: 10/03/2018              Requested by: [name], NP [address] [place], [place] [address] PCP: [name], NP   Assessment & Plan: Visit Diagnoses:  1. S/P total knee arthroplasty, right   2. Chronic pain of right knee     Plan: Impression is 1 year status post right total knee replacement and left tricompartmental degenerative joint disease.  In regards to the knee replacement this appears to be stable and without complication.  I do think that she has developed some spurring of the lateral patellar facet which could be contributing to some impingement.  I do not get the sense that the implant is loose.  I also think that part of her issue is relative weakness of her quadriceps which I have recommended outpatient physical therapy.  For the left knee she understands that she does have advanced tricompartmental DJD and she will let us know when she is ready for knee replacement.  Overall I feel that she is improved and happy that she had a right knee replacement.  Follow-Up Instructions: Return in about 1 year (around 10/03/2019).   Orders:  Orders Placed This Encounter  Procedures  . XR KNEE 3 VIEW RIGHT   No orders of the defined types were placed in this encounter.     Procedures: No procedures performed   Clinical Data: No additional findings.   Subjective: Chief Complaint  Patient presents with  . Right Knee - Pain    Nechama is 1 year status post right total knee replacement.  Overall she has been doing okay but she does complain of stiffness with prolonged sitting and difficulty with using stairs.  She also feels a clicking and popping around the lateral aspect of the patellofemoral compartment.  She denies any swelling or any numbness and tingling or constitutional symptoms.  She states that the discomfort is worse with increased  flexion.  She denies any definite injuries.   Review of Systems  Constitutional: Negative.   HENT: Negative.   Eyes: Negative.   Respiratory: Negative.   Cardiovascular: Negative.   Endocrine: Negative.   Musculoskeletal: Negative.   Neurological: Negative.   Hematological: Negative.   Psychiatric/Behavioral: Negative.   All other systems reviewed and are negative.    Objective: Vital Signs: There were no vitals taken for this visit.  Physical Exam Vitals signs and nursing note reviewed.  Constitutional:      Appearance: She is well-developed.  Pulmonary:     Effort: Pulmonary effort is normal.  Skin:    General: Skin is warm.     Capillary Refill: Capillary refill takes less than 2 seconds.  Neurological:     Mental Status: She is alert and oriented to person, place, and time.  Psychiatric:        Behavior: Behavior normal.        Thought Content: Thought content normal.        Judgment: Judgment normal.     Ortho Exam Right knee exam shows a fully healed surgical scar.  Trace joint effusion.  Range of motion is actually quite good.  The popping and crackling is due to the significant patellofemoral crepitus and she does feel some discomfort on the lateral patella facet.  Overall collaterals are stable.  No evidence of infection.  Her right quadriceps appears to be slightly atrophied compared to the contralateral side. Left knee exam shows no joint effusion.  Collaterals and cruciates are stable. Specialty Comments:  No specialty comments available.  Imaging: Xr Knee 3 View Right  Result Date: 10/03/2018 Stable total knee replacement without complication.  Lateral spurring of patella which may cause impingement.    PMFS History: Patient Active Problem List   Diagnosis Date Noted  . Total knee replacement status 09/19/2017  . Sprain of anterior talofibular ligament of right ankle 12/13/2016   Past Medical History:  Diagnosis Date  . Arthritis   . Headache     H/O bad HA, since using CPAP- she has had improvement with that issue   . Hypertension   . OSA on CPAP    settting - 8, uses CPAP q night     Family History  Problem Relation Age of Onset  . Breast cancer Sister     Past Surgical History:  Procedure Laterality Date  . CHOLECYSTECTOMY  1998  . TOTAL KNEE ARTHROPLASTY Right 09/19/2017   Procedure: RIGHT TOTAL KNEE ARTHROPLASTY;  Surgeon: Leandrew Koyanagi, MD;  Location: [place];  Service: Orthopedics;  Laterality: Right;  . TUBAL LIGATION     Social History   Occupational History  . Not on file  Tobacco Use  . Smoking status: Never Smoker  . Smokeless tobacco: Never Used  Substance and Sexual Activity  . Alcohol use: No  . Drug use: No  . Sexual activity: Not Currently

## 2018-10-05 NOTE — Progress Notes (Signed)
Subjective:   Rachel Crane, female    DOB: [birth year], 73 y.o.   MRN: [id number]  [name], NP:  Chief Complaint  Patient presents with  . Palpitations  . Follow-up    HPI: Rachel Crane  is a 73 y.o. female  with past medical history of hypertension, prediabetes, sleep apnea currently on CPAP, remote history of aortic valve sclerosis by previous echo; however, not noted by echo in 2018. No history of tobacco or alcohol use.  Patient underwent Lexiscan nuclear stress test in August 2018 that although had exaggerated blood pressure response and poor exercise capacity on routine treadmill stress test, had normal perfusion by nuclear stress test. She was started on statin therapy at that time in view of her risk factors and soft plaque noted on carotid duplex.   She was last seen by [place] in 2019 and palpitations had resolved with using Carvedilol. She recently called our office complaining of worsening palpitations and is now here for follow up.  She reports intermittent episodes of heart racing that are now occurring a few times a week. They have improved over the last one week. Describes as heart racing and can last for several minutes. Can happen during the middle of the night or during the day. She also has some left sided chest discomfort at rest. Not with exertion. Not having pain today. Reports having labs performed this morning at PCP office.   Past Medical History:  Diagnosis Date  . Abnormal stress ECG 10/06/2018  . Aortic valve sclerosis 10/06/2018  . Arthritis   . Bilateral carotid bruits 10/06/2018  . Headache    H/O bad HA, since using CPAP- she has had improvement with that issue   . Hypertension   . Laboratory examination 10/06/2018  . OSA on CPAP    settting - 8, uses CPAP q night     Past Surgical History:  Procedure Laterality Date  . CHOLECYSTECTOMY  1998  . TOTAL KNEE ARTHROPLASTY Right 09/19/2017   Procedure: RIGHT TOTAL KNEE ARTHROPLASTY;  Surgeon:  Leandrew Koyanagi, MD;  Location: [facility];  Service: Orthopedics;  Laterality: Right;  . TUBAL LIGATION      Family History  Problem Relation Age of Onset  . Breast cancer Sister     Social History   Socioeconomic History  . Marital status: Widowed    Spouse name: Not on file  . Number of children: 3  . Years of education: Not on file  . Highest education level: Not on file  Occupational History  . Not on file  Social Needs  . Financial resource strain: Not on file  . Food insecurity:    Worry: Not on file    Inability: Not on file  . Transportation needs:    Medical: Not on file    Non-medical: Not on file  Tobacco Use  . Smoking status: Never Smoker  . Smokeless tobacco: Never Used  Substance and Sexual Activity  . Alcohol use: Yes    Comment: sometimes wine  . Drug use: No  . Sexual activity: Not Currently  Lifestyle  . Physical activity:    Days per week: Not on file    Minutes per session: Not on file  . Stress: Not on file  Relationships  . Social connections:    Talks on phone: Not on file    Gets together: Not on file    Attends religious service: Not on file    Active member  of club or organization: Not on file    Attends meetings of clubs or organizations: Not on file    Relationship status: Not on file  . Intimate partner violence:    Fear of current or ex partner: Not on file    Emotionally abused: Not on file    Physically abused: Not on file    Forced sexual activity: Not on file  Other Topics Concern  . Not on file  Social History Narrative  . Not on file    Current Meds  Medication Sig  . aspirin EC 81 MG tablet Take 1 tablet (81 mg total) by mouth daily.  . carvedilol (COREG) 3.125 MG tablet Take 1 tablet (3.125 mg total) by mouth 2 (two) times daily with a meal.  . chlorthalidone (HYGROTON) 25 MG tablet Take 1 tablet (25 mg total) by mouth daily.  Marland Kitchen ibuprofen (ADVIL,MOTRIN) 600 MG tablet Take 1 tablet (600 mg total) by mouth every 8 (eight)  hours as needed.  . latanoprost (XALATAN) 0.005 % ophthalmic solution INT 1 GTT IN OU QD IN THE EVE  . losartan (COZAAR) 100 MG tablet Take 1 tablet (100 mg total) by mouth daily.  . Multiple Vitamins-Minerals (WOMENS 50+ MULTI VITAMIN/MIN PO) Take by mouth daily.  . nitroGLYCERIN (NITROSTAT) 0.4 MG SL tablet Place 1 tablet (0.4 mg total) under the tongue every 5 (five) minutes as needed for chest pain.  Marland Kitchen triamcinolone cream (KENALOG) 0.1 % APPLY TO THE AFFECTED AREA TWICE DAILY  . vitamin B-12 (CYANOCOBALAMIN) 500 MCG tablet Take 1,000 mcg by mouth. sometimes     Review of Systems  Constitution: Negative for decreased appetite, malaise/fatigue, weight gain and weight loss.  Eyes: Negative for visual disturbance.  Cardiovascular: Positive for chest pain and palpitations. Negative for claudication, dyspnea on exertion (also at rest), leg swelling, orthopnea and syncope.  Respiratory: Negative for hemoptysis and wheezing.   Endocrine: Negative for cold intolerance and heat intolerance.  Hematologic/Lymphatic: Does not bruise/bleed easily.  Skin: Negative for nail changes.  Musculoskeletal: Positive for joint pain (R>L shoulder). Negative for muscle weakness and myalgias.       Shoulder: Assessment of pain reveals the following findings: - Causative Factors include - overhead activities and placing hand behind head.  Gastrointestinal: Negative for abdominal pain, change in bowel habit, nausea and vomiting.       Belching and Indigestion.  Neurological: Negative for difficulty with concentration, dizziness, focal weakness and headaches.  Psychiatric/Behavioral: Negative for altered mental status and suicidal ideas.  All other systems reviewed and are negative.      Objective:     Blood pressure 131/78, pulse 62, height '5\' 6"'$  (1.676 m), weight 201 lb (91.2 kg), SpO2 97 %.  Cardiac studies:  EKG 06/04/2018: Normal sinus rhythm at 67 bpm, normal axis, normal interval, no evidence of  ischemia. Normal EKG. No change from EKG 12/21/2016.  Echocardiogram 01/17/2017: Left ventricle cavity is normal in size. Moderate concentric hypertrophy of the left ventricle. Normal global wall motion. Doppler evidence of grade II (pseudonormal) diastolic dysfunction. Diastolic dysfunction findings suggests elevated LA/LV end diastolic pressure. Calculated EF 68%. Trace mitral regurgitation. Trace tricuspid regurgitation. Unable to estimate PA pressure due to absence/minimal TR signal.  Carotid artery duplex 01/09/2017: There is mild soft plaque in bilateral carotid arteries without stenosis.  Antegrade right vertebral artery flow. Antegrade left vertebral artery flow.  Lexiscan myoview stress test 12/31/2016: 1. The resting electrocardiogram demonstrated normal sinus rhythm, normal resting conduction, no resting arrhythmias and  normal rest repolarization. Stress EKG is non-diagnostic for ischemia as it a pharmacologic stress using Lexiscan. Stress symptoms included dyspnea. 2. Myocardial perfusion imaging is normal. Overall left ventricular systolic function was normal without regional wall motion abnormalities. The left ventricular ejection fraction was 75%.  Treadmill stress test 12/28/2016:  Indication: SoB, Aortic Atherosclerosis Resting EKG demonstrates NSR. The patient exercised according to Modified Bruce Protocol, Total time recorded 4:00 min achieving max heart rate of 156 which was 103% of THR for age and 4.64 METS of work. Stress terminated due to marked dyspnea, fatigue and THR (>85% MPHR)/MPHR met. Hypertensive BP response. There was 2 mm ST depression which persisted for > 2 minutes into recovery. There were no significant arrhythmias. Rec: Modofied Bruce protocol. Abnormal treadmill stress text with 2 mm horizontal ST depression at peak persisting for > 2 minutes into recovery. Symptoms of dyspnea. Hypertensive BP response. with peak BP of 284/84 mm Hg. No significant arrhythmias.  Recommend different modality of testing to evaluate for ischemia vs hypertensive heart response.   Sleep Study 09/2016: sleep apnea  Recent Labs:   CMP Latest Ref Rng & Units 05/12/2018 10/17/2017  Glucose 65 - 99 mg/dL 96 118(H)  BUN 8 - 27 mg/dL 19 16  Creatinine 0.57 - 1.00 mg/dL 0.97 0.99  Sodium 134 - 144 mmol/L 141 141  Potassium 3.5 - 5.2 mmol/L 3.3(L) 2.8(L)  Chloride 96 - 106 mmol/L 101 97  CO2 20 - 29 mmol/L 24 27  Calcium 8.7 - 10.3 mg/dL 9.4 9.6  Total Protein 6.0 - 8.5 g/dL 7.2 -  Total Bilirubin 0.0 - 1.2 mg/dL <0.2 -  Alkaline Phos 39 - 117 IU/L 88 -  AST 0 - 40 IU/L 18 -  ALT 0 - 32 IU/L 17 -   CBC Latest Ref Rng & Units 05/12/2018 10/17/2017  WBC 3.4 - 10.8 x10E3/uL 8.3 7.4  Hemoglobin 11.1 - 15.9 g/dL 10.8(L) 10.6(L)  Hematocrit 34.0 - 46.6 % 33.1(L) 33.4(L)  Platelets 150 - 450 x10E3/uL 220 271   Lipid Panel     Component Value Date/Time   CHOL 189 02/22/2017 0855   TRIG 78 02/22/2017 0855   HDL 49 02/22/2017 0855   CHOLHDL 3.9 02/22/2017 0855   LDLCALC 124 (H) 02/22/2017 0855   Lab Results  Component Value Date   TSH 1.200 05/12/2018    Physical Exam  Constitutional: She is oriented to person, place, and time. She appears well-developed and well-nourished. No distress.  HENT:  Head: Normocephalic and atraumatic.  Eyes: Conjunctivae are normal.  Neck: Normal range of motion. Neck supple. No JVD present. No thyromegaly present.  Cardiovascular: Normal rate, regular rhythm, normal heart sounds and intact distal pulses. Exam reveals no gallop.  No murmur heard. Pulses:      Carotid pulses are on the right side with bruit and on the left side with bruit. SEM - Aortic Area  Pulmonary/Chest: Effort normal and breath sounds normal.  Abdominal: Soft. Bowel sounds are normal. She exhibits no distension.  Musculoskeletal: Normal range of motion.        General: No edema.  Neurological: She is alert and oriented to person, place, and time.  Skin: Skin is  warm and dry.  Psychiatric: She has a normal mood and affect.  Vitals reviewed.          Assessment & Recommendations:  1. Palpitations She has had intermittent episodes of heart racing. Feel that A fib should be excluded given her history of OSA. Will place  her on 30 day event monitor for further evaluation. Continue with Carvedilol.   2. Essential hypertension Well controlled on current therapy.  3. Atypical chest pain Left sided chest discomfort under her left breast. Feel that this is likely related to musculoskeletal etiology. Does not occur with exertion. Has had negative nuclear stress test in 2018. Will hold off for now on further evaluation.   Plan: I will see her back after the event monitor for follow up.    Jeri Lager, MSN, APRN, FNP-C [facility], [place] Office: [phone] Fax: [phone]

## 2018-10-06 ENCOUNTER — Ambulatory Visit: Payer: Medicare Other

## 2018-10-06 ENCOUNTER — Other Ambulatory Visit (INDEPENDENT_AMBULATORY_CARE_PROVIDER_SITE_OTHER): Payer: Self-pay

## 2018-10-06 ENCOUNTER — Other Ambulatory Visit: Payer: Self-pay

## 2018-10-06 ENCOUNTER — Other Ambulatory Visit: Payer: Self-pay | Admitting: Primary Care

## 2018-10-06 ENCOUNTER — Ambulatory Visit (INDEPENDENT_AMBULATORY_CARE_PROVIDER_SITE_OTHER): Payer: Medicare Other | Admitting: Cardiology

## 2018-10-06 ENCOUNTER — Encounter: Payer: Self-pay | Admitting: Cardiology

## 2018-10-06 VITALS — BP 131/78 | HR 62 | Ht 66.0 in | Wt 201.0 lb

## 2018-10-06 DIAGNOSIS — R0789 Other chest pain: Secondary | ICD-10-CM

## 2018-10-06 DIAGNOSIS — R9439 Abnormal result of other cardiovascular function study: Secondary | ICD-10-CM

## 2018-10-06 DIAGNOSIS — R0989 Other specified symptoms and signs involving the circulatory and respiratory systems: Secondary | ICD-10-CM

## 2018-10-06 DIAGNOSIS — R002 Palpitations: Secondary | ICD-10-CM

## 2018-10-06 DIAGNOSIS — I10 Essential (primary) hypertension: Secondary | ICD-10-CM

## 2018-10-06 DIAGNOSIS — E7800 Pure hypercholesterolemia, unspecified: Secondary | ICD-10-CM

## 2018-10-06 DIAGNOSIS — D649 Anemia, unspecified: Secondary | ICD-10-CM

## 2018-10-06 DIAGNOSIS — E876 Hypokalemia: Secondary | ICD-10-CM

## 2018-10-06 DIAGNOSIS — I358 Other nonrheumatic aortic valve disorders: Secondary | ICD-10-CM

## 2018-10-06 DIAGNOSIS — Z0189 Encounter for other specified special examinations: Secondary | ICD-10-CM

## 2018-10-06 HISTORY — DX: Other specified symptoms and signs involving the circulatory and respiratory systems: R09.89

## 2018-10-06 HISTORY — DX: Abnormal result of other cardiovascular function study: R94.39

## 2018-10-06 HISTORY — DX: Encounter for other specified special examinations: Z01.89

## 2018-10-06 HISTORY — DX: Other nonrheumatic aortic valve disorders: I35.8

## 2018-10-06 NOTE — Progress Notes (Signed)
Patient in today requesting fasting labs

## 2018-10-07 LAB — LIPID PANEL
Chol/HDL Ratio: 3.6 ratio (ref 0.0–4.4)
Cholesterol, Total: 179 mg/dL (ref 100–199)
HDL: 50 mg/dL (ref >39)
LDL Calculated: 114 mg/dL — ABNORMAL HIGH (ref 0–99)
Triglycerides: 76 mg/dL (ref 0–149)
VLDL Cholesterol Cal: 15 mg/dL (ref 5–40)

## 2018-10-07 LAB — CBC WITH DIFFERENTIAL
Basophils Absolute: 0 10*3/uL (ref 0.0–0.2)
Basos: 1 %
EOS (ABSOLUTE): 0.1 10*3/uL (ref 0.0–0.4)
Eos: 1 %
Hematocrit: 33.8 % — ABNORMAL LOW (ref 34.0–46.6)
Hemoglobin: 11.2 g/dL (ref 11.1–15.9)
Immature Grans (Abs): 0 10*3/uL (ref 0.0–0.1)
Immature Granulocytes: 0 %
Lymphocytes Absolute: 3 10*3/uL (ref 0.7–3.1)
Lymphs: 46 %
MCH: 26.9 pg (ref 26.6–33.0)
MCHC: 33.1 g/dL (ref 31.5–35.7)
MCV: 81 fL (ref 79–97)
Monocytes Absolute: 0.4 10*3/uL (ref 0.1–0.9)
Monocytes: 6 %
Neutrophils Absolute: 3 10*3/uL (ref 1.4–7.0)
Neutrophils: 46 %
RBC: 4.16 x10E6/uL (ref 3.77–5.28)
RDW: 14.1 % (ref 11.7–15.4)
WBC: 6.4 10*3/uL (ref 3.4–10.8)

## 2018-10-07 LAB — COMPREHENSIVE METABOLIC PANEL
ALT: 20 IU/L (ref 0–32)
AST: 21 IU/L (ref 0–40)
Albumin/Globulin Ratio: 1.4 (ref 1.2–2.2)
Albumin: 4.3 g/dL (ref 3.7–4.7)
Alkaline Phosphatase: 74 IU/L (ref 39–117)
BUN/Creatinine Ratio: 18 (ref 12–28)
BUN: 19 mg/dL (ref 8–27)
Bilirubin Total: 0.4 mg/dL (ref 0.0–1.2)
CO2: 25 mmol/L (ref 20–29)
Calcium: 9.8 mg/dL (ref 8.7–10.3)
Chloride: 101 mmol/L (ref 96–106)
Creatinine, Ser: 1.07 mg/dL — ABNORMAL HIGH (ref 0.57–1.00)
GFR calc Af Amer: 60 mL/min/{1.73_m2} (ref >59)
GFR calc non Af Amer: 52 mL/min/{1.73_m2} — ABNORMAL LOW (ref >59)
Globulin, Total: 3 g/dL (ref 1.5–4.5)
Glucose: 107 mg/dL — ABNORMAL HIGH (ref 65–99)
Potassium: 3.2 mmol/L — ABNORMAL LOW (ref 3.5–5.2)
Sodium: 142 mmol/L (ref 134–144)
Total Protein: 7.3 g/dL (ref 6.0–8.5)

## 2018-10-08 ENCOUNTER — Telehealth (INDEPENDENT_AMBULATORY_CARE_PROVIDER_SITE_OTHER): Payer: Self-pay

## 2018-10-08 ENCOUNTER — Other Ambulatory Visit: Payer: Self-pay | Admitting: Primary Care

## 2018-10-08 DIAGNOSIS — E876 Hypokalemia: Secondary | ICD-10-CM

## 2018-10-08 MED ORDER — POTASSIUM CHLORIDE 20 MEQ PO PACK: 20.0000 meq | PACK | Freq: Once | ORAL | 2 refills | Status: DC

## 2018-10-08 MED ORDER — POTASSIUM CHLORIDE 20 MEQ PO PACK: 20.0000 meq | PACK | Freq: Every day | ORAL | 2 refills | Status: DC

## 2018-10-08 NOTE — Telephone Encounter (Signed)
-----   Message from [name], NP sent at 10/08/2018 10:28 AM EDT ----- K+ low sent in K+ supplement

## 2018-10-08 NOTE — Telephone Encounter (Signed)
Patient is aware that potassium was low and potassium pills sent to her pharmacy. Nat Christen, CMA

## 2018-10-10 ENCOUNTER — Telehealth: Payer: Self-pay | Admitting: *Deleted

## 2018-10-10 ENCOUNTER — Other Ambulatory Visit: Payer: Self-pay

## 2018-10-10 ENCOUNTER — Ambulatory Visit (INDEPENDENT_AMBULATORY_CARE_PROVIDER_SITE_OTHER): Payer: Medicare Other | Admitting: Family Medicine

## 2018-10-10 ENCOUNTER — Encounter: Payer: Self-pay | Admitting: Family Medicine

## 2018-10-10 ENCOUNTER — Telehealth (INDEPENDENT_AMBULATORY_CARE_PROVIDER_SITE_OTHER): Payer: Self-pay | Admitting: Radiology

## 2018-10-10 VITALS — BP 130/70 | HR 94 | Temp 98.6°F | Resp 16 | Ht 66.0 in | Wt 200.8 lb

## 2018-10-10 DIAGNOSIS — J4540 Moderate persistent asthma, uncomplicated: Secondary | ICD-10-CM

## 2018-10-10 DIAGNOSIS — J309 Allergic rhinitis, unspecified: Secondary | ICD-10-CM

## 2018-10-10 DIAGNOSIS — H1010 Acute atopic conjunctivitis, unspecified eye: Secondary | ICD-10-CM | POA: Insufficient documentation

## 2018-10-10 DIAGNOSIS — J4541 Moderate persistent asthma with (acute) exacerbation: Secondary | ICD-10-CM | POA: Diagnosis not present

## 2018-10-10 DIAGNOSIS — L299 Pruritus, unspecified: Secondary | ICD-10-CM

## 2018-10-10 DIAGNOSIS — J302 Other seasonal allergic rhinitis: Secondary | ICD-10-CM | POA: Insufficient documentation

## 2018-10-10 MED ORDER — ALBUTEROL SULFATE HFA 108 (90 BASE) MCG/ACT IN AERS: 2.0000 | INHALATION_SPRAY | RESPIRATORY_TRACT | 3 refills | Status: DC | PRN

## 2018-10-10 MED ORDER — IPRATROPIUM BROMIDE 0.03 % NA SOLN: 2.0000 | Freq: Three times a day (TID) | NASAL | 12 refills | Status: DC

## 2018-10-10 MED ORDER — FLUTICASONE PROPIONATE 50 MCG/ACT NA SUSP: 2.0000 | Freq: Every day | NASAL | 5 refills | Status: DC

## 2018-10-10 MED ORDER — BUDESONIDE-FORMOTEROL FUMARATE 80-4.5 MCG/ACT IN AERO: 2.0000 | INHALATION_SPRAY | Freq: Two times a day (BID) | RESPIRATORY_TRACT | 5 refills | Status: DC

## 2018-10-10 MED ORDER — AZELASTINE HCL 0.1 % NA SOLN: 2.0000 | Freq: Two times a day (BID) | NASAL | 5 refills | Status: DC

## 2018-10-10 NOTE — Telephone Encounter (Signed)
mailed

## 2018-10-10 NOTE — Progress Notes (Signed)
[address] [place] [address] Dept: [phone]  FOLLOW UP NOTE  Patient ID: Rachel Crane, female    DOB: 01/29/1946  Age: 73 y.o. MRN: [id number] Date of Office Visit: 10/10/2018  Assessment  Chief Complaint: post nasal drip (making her choke, sleeps with c-pap)  HPI  Rachel Crane is a 73 year old female who presents to the clinic for a follow up visit. She was last seen in this clinic on 04/13/2015 by Dr. Nelva Bush evaluation of allergic rhinitis and tinnitus. At today's visit, she reports that she frequently experiences nasal congestion and post nasal drainage which began about 1 month ago. She reports that she has tried Xyzal and it stopped working. She has tried nasal sprays in the past and reluctantly reported that these have been effective. She reports that she occasionally experiences pruritis without rash which is worst on her hands and back and aggravated by heat and being outside. She is currently using a daily moisturizer as well as triamcinolone cream that was prescribed by her dermatologist. She presents with a new problem of shortness of breath and wheeze which is worse with activity and at night. She describes these symptoms as intermittent and prominent with weather changes. She denies cough. She used an albuterol inhaler many years ago on an as needed basis with relief of symptoms. Her current medications are listed in the chart.   Drug Allergies:  No Known Allergies  Physical Exam: BP 130/70 (BP Location: Left Arm, Patient Position: Sitting, Cuff Size: Large)   Pulse 94   Temp 98.6 F (37 C) (Oral)   Resp 16   Ht 5\' 6"  (1.676 m)   Wt 200 lb 12.8 oz (91.1 kg)   SpO2 97%   BMI 32.41 kg/m    Physical Exam Vitals signs reviewed.  Constitutional:      Appearance: Normal appearance.  HENT:     Head: Normocephalic and atraumatic.     Right Ear: Tympanic membrane normal.     Left Ear: Tympanic membrane normal.     Nose:     Comments: Bilateral nares  edematous and pale with clear nasal drainage noted. Pharynx slightly erythematous with no exudate noted. Ears normal. Eyes normal.    Mouth/Throat:     Pharynx: Oropharynx is clear.  Eyes:     Conjunctiva/sclera: Conjunctivae normal.  Neck:     Musculoskeletal: Normal range of motion and neck supple.  Cardiovascular:     Rate and Rhythm: Normal rate and regular rhythm.     Heart sounds: Normal heart sounds. No murmur.  Pulmonary:     Effort: Pulmonary effort is normal.     Breath sounds: Normal breath sounds.     Comments: Lungs clear to auscultation Musculoskeletal: Normal range of motion.  Skin:    General: Skin is warm and dry.     Comments: No rash noted in the clinic today  Neurological:     Mental Status: She is alert and oriented to person, place, and time.  Psychiatric:        Mood and Affect: Mood normal.        Behavior: Behavior normal.        Thought Content: Thought content normal.        Judgment: Judgment normal.     Diagnostics: FVC 2.14, FEV1 1.47. Predicted FVC 3.23, predicted FEV1 2.44. Spirometry indicates mild restriction. Post bronchodilator therapy FVC 2.36, FEV1 1.62. Post bronchodilator therapy indicates 10% improvement in FEV1 and 150 mL improvement in  FEV1.   Assessment and Plan: 1. Moderate persistent asthma without complication   2. Allergic rhinoconjunctivitis   3. Pruritus     Meds ordered this encounter  Medications  . fluticasone (FLONASE) 50 MCG/ACT nasal spray    Sig: Place 2 sprays into both nostrils daily.    Dispense:  1 g    Refill:  5  . azelastine (ASTELIN) 0.1 % nasal spray    Sig: Place 2 sprays into both nostrils 2 (two) times daily.    Dispense:  30 mL    Refill:  5  . ipratropium (ATROVENT) 0.03 % nasal spray    Sig: Place 2 sprays into both nostrils 3 (three) times daily.    Dispense:  30 mL    Refill:  12  . albuterol (PROAIR HFA) 108 (90 Base) MCG/ACT inhaler    Sig: Inhale 2 puffs into the lungs every 4 (four) hours  as needed for wheezing or shortness of breath.    Dispense:  1 Inhaler    Refill:  3  . budesonide-formoterol (SYMBICORT) 80-4.5 MCG/ACT inhaler    Sig: Inhale 2 puffs into the lungs 2 (two) times daily.    Dispense:  1 Inhaler    Refill:  5    Patient Instructions  Allergic Rhinitis: - Continue Flonase 2 sprays each nostril once daily for sinus congestion - Start Azelastine 0.1% 2 sprays each nostril twice a day for runny nose/post-nasal drip. Will send prescription. - Can use both Flonase and Azelastine together if needed - Can consider adding Ipratropium nasal spray as needed for non-allergic rhinitis (exposure to bleach, etc) - Allerga 180 mg as needed for nasal symptoms   Asthma - Your lung testing today showed you improved  - ProAir 2 puffs every 4 hours as needed for cough or wheeze - Symbicort 80- 2 puffs twice a day for the next 2 weeks or until you are cough and wheeze free. Use with a spacer to prevent cough and wheeze  Pruritis - Continue daily moisturizer Begin Allerga as above - Continue Triamcinolone 0.1% cream on red itchy areas below your face twice a day as needed.  Continue the other medications as listed in the chart.  Follow up in 3 months or sooner if needed    Return in about 3 months (around 01/10/2019), or if symptoms worsen or fail to improve.    Thank you for the opportunity to care for this patient.  Please do not hesitate to contact me with questions.  Gareth Morgan, FNP [facility]

## 2018-10-10 NOTE — Patient Instructions (Addendum)
Allergic Rhinitis: - Continue Flonase 2 sprays each nostril once daily for sinus congestion - Start Azelastine 0.1% 2 sprays each nostril twice a day for runny nose/post-nasal drip. Will send prescription. - Can use both Flonase and Azelastine together if needed - Can consider adding Ipratropium nasal spray as needed for non-allergic rhinitis (exposure to bleach, etc) - Allerga 180 mg as needed for nasal symptoms   Asthma - Your lung testing today showed you improved  - ProAir 2 puffs every 4 hours as needed for cough or wheeze - Symbicort 80- 2 puffs twice a day for the next 2 weeks or until you are cough and wheeze free. Use with a spacer to prevent cough and wheeze  Pruritis - Continue daily moisturizer Begin Allerga as above - Continue Triamcinolone 0.1% cream on red itchy areas below your face twice a day as needed.  Continue the other medications as listed in the chart.  Follow up in 3 months or sooner if needed

## 2018-10-10 NOTE — Telephone Encounter (Signed)
yes

## 2018-10-10 NOTE — Telephone Encounter (Signed)
Ok for note 

## 2018-10-10 NOTE — Telephone Encounter (Signed)
Letter has bene generated.

## 2018-10-10 NOTE — Addendum Note (Signed)
Addended by: Valere Dross on: 10/10/2018 01:47 PM   Modules accepted: Orders

## 2018-10-14 ENCOUNTER — Telehealth: Payer: Self-pay | Admitting: Allergy

## 2018-10-14 DIAGNOSIS — J4540 Moderate persistent asthma, uncomplicated: Secondary | ICD-10-CM

## 2018-10-14 NOTE — Telephone Encounter (Signed)
Patient is calling wanting a nebulizer and nebulizer solution Please call patient to answer any questions

## 2018-10-14 NOTE — Telephone Encounter (Signed)
[facility] please advise if patient can get a nebulizer

## 2018-10-14 NOTE — Telephone Encounter (Signed)
She can have a nebulizer and albuterol 0.083% to use every 4 hours as needed for shortness of breath, cough, or wheeze.

## 2018-10-15 ENCOUNTER — Other Ambulatory Visit: Payer: Self-pay

## 2018-10-15 MED ORDER — ALBUTEROL SULFATE (2.5 MG/3ML) 0.083% IN NEBU: 2.5000 mg | INHALATION_SOLUTION | RESPIRATORY_TRACT | 1 refills | Status: DC | PRN

## 2018-10-15 NOTE — Telephone Encounter (Signed)
The order has been placed and signed. Thank you

## 2018-10-15 NOTE — Telephone Encounter (Signed)
Patient nebulizer solution was ordered will need to send nebulizer machine to [facility] so that it could be mailed to patient. Please place Rx for machine

## 2018-10-15 NOTE — Telephone Encounter (Signed)
Thank you :)

## 2018-10-20 DIAGNOSIS — R002 Palpitations: Secondary | ICD-10-CM | POA: Diagnosis not present

## 2018-10-21 ENCOUNTER — Telehealth: Payer: Self-pay

## 2018-10-21 NOTE — Telephone Encounter (Signed)
I received a fax from [employer], the patient returned the monitor. I called pt and she said she had an allergic reaction to the strips and mailed it back. I asked her if she was still having palps she said no that she was OK.

## 2018-10-22 ENCOUNTER — Ambulatory Visit: Payer: Medicare Other | Admitting: Allergy

## 2018-10-23 ENCOUNTER — Encounter (HOSPITAL_COMMUNITY): Payer: Self-pay

## 2018-10-23 ENCOUNTER — Ambulatory Visit (HOSPITAL_COMMUNITY)
Admission: EM | Admit: 2018-10-23 | Discharge: 2018-10-23 | Disposition: A | Discharge status: Home / Self Care | Payer: Medicare Other | Attending: Internal Medicine | Admitting: Internal Medicine

## 2018-10-23 DIAGNOSIS — I10 Essential (primary) hypertension: Secondary | ICD-10-CM

## 2018-10-23 DIAGNOSIS — R5383 Other fatigue: Secondary | ICD-10-CM | POA: Diagnosis not present

## 2018-10-23 LAB — CK: Total CK: 156 U/L (ref 38–234)

## 2018-10-23 LAB — TSH: TSH: 0.98 u[IU]/mL (ref 0.350–4.500)

## 2018-10-23 NOTE — ED Provider Notes (Signed)
[place] [facility]    CSN: [id number] Arrival date & time: 10/23/18  1424     History   Chief Complaint Chief Complaint  Patient presents with  . Fatigue    HPI Rachel Crane is a 73 y.o. female with a history of hypertension and hyperlipidemia recently started on a statin comes to urgent care with complaints of generalized weakness which has been ongoing for over 2 weeks.  Symptoms have been insidious and is gotten progressively worse.  She denies any numbness or tingling.  No difficulty breathing.  No dizziness, near syncope or syncopal episode.  Patient's appetite is fair.  No weight loss.  Patient denies any mood changes and sleeps well at night.  She has a history of CPAP and uses her CPAP machine every night.  No nausea or vomiting.  No diarrhea.  No cold intolerance.  Patient's diet is fairly balanced.Marland Kitchen   HPI  Past Medical History:  Diagnosis Date  . Abnormal stress ECG 10/06/2018  . Aortic valve sclerosis 10/06/2018  . Arthritis   . Bilateral carotid bruits 10/06/2018  . Headache    H/O bad HA, since using CPAP- she has had improvement with that issue   . Hypertension   . Laboratory examination 10/06/2018  . OSA on CPAP    settting - 8, uses CPAP q night     Patient Active Problem List   Diagnosis Date Noted  . Allergic rhinoconjunctivitis 10/10/2018  . Moderate persistent asthma with acute exacerbation 10/10/2018  . Pruritus 10/10/2018  . Aortic valve sclerosis 10/06/2018  . Bilateral carotid bruits 10/06/2018  . Abnormal stress ECG 10/06/2018  . Laboratory examination 10/06/2018  . Palpitations 10/06/2018  . Total knee replacement status 09/19/2017  . Sprain of anterior talofibular ligament of right ankle 12/13/2016    Past Surgical History:  Procedure Laterality Date  . CHOLECYSTECTOMY  1998  . TOTAL KNEE ARTHROPLASTY Right 09/19/2017   Procedure: RIGHT TOTAL KNEE ARTHROPLASTY;  Surgeon: Leandrew Koyanagi, MD;  Location: [facility];  Service: Orthopedics;   Laterality: Right;  . TUBAL LIGATION      OB History   No obstetric history on file.      Home Medications    Prior to Admission medications   Medication Sig Start Date End Date Taking? Authorizing Provider  albuterol (PROAIR HFA) 108 (90 Base) MCG/ACT inhaler Inhale 2 puffs into the lungs every 4 (four) hours as needed for wheezing or shortness of breath. 10/10/18   Ambs, Kathrine Cords, FNP  albuterol (PROVENTIL) (2.5 MG/3ML) 0.083% nebulizer solution Take 3 mLs (2.5 mg total) by nebulization every 4 (four) hours as needed for wheezing or shortness of breath. 10/15/18   Dara Hoyer, FNP  aspirin EC 81 MG tablet Take 1 tablet (81 mg total) by mouth daily. 05/12/18   Clent Demark, PA-C  azelastine (ASTELIN) 0.1 % nasal spray Place 2 sprays into both nostrils 2 (two) times daily. 10/10/18   Dara Hoyer, FNP  budesonide-formoterol (SYMBICORT) 80-4.5 MCG/ACT inhaler Inhale 2 puffs into the lungs 2 (two) times daily. 10/10/18   Dara Hoyer, FNP  carvedilol (COREG) 3.125 MG tablet Take 1 tablet (3.125 mg total) by mouth 2 (two) times daily with a meal. 09/09/18   [name], NP  chlorthalidone (HYGROTON) 25 MG tablet Take 1 tablet (25 mg total) by mouth daily. 09/09/18   [name], NP  fluticasone (FLONASE) 50 MCG/ACT nasal spray Place 2 sprays into both nostrils daily. 10/10/18  Dara Hoyer, FNP  guaiFENesin (MUCINEX) 600 MG 12 hr tablet Take 1 tablet (600 mg total) by mouth 2 (two) times daily. 09/09/18   [name], NP  ibuprofen (ADVIL,MOTRIN) 600 MG tablet Take 1 tablet (600 mg total) by mouth every 8 (eight) hours as needed. 09/09/18   [name], NP  ipratropium (ATROVENT) 0.03 % nasal spray Place 2 sprays into both nostrils 3 (three) times daily. 10/10/18   Ambs, Kathrine Cords, FNP  latanoprost (XALATAN) 0.005 % ophthalmic solution INT 1 GTT IN OU QD IN THE EVE 12/05/17   [provider]  losartan (COZAAR) 100 MG tablet Take 1 tablet (100 mg total) by mouth  daily. 09/09/18   [name], NP  Multiple Vitamins-Minerals (WOMENS 50+ MULTI VITAMIN/MIN PO) Take by mouth daily.    [provider]  nitroGLYCERIN (NITROSTAT) 0.4 MG SL tablet Place 1 tablet (0.4 mg total) under the tongue every 5 (five) minutes as needed for chest pain. 05/12/18   Clent Demark, PA-C  omeprazole (PRILOSEC) 40 MG capsule Take 1 capsule (40 mg total) by mouth daily. 09/09/18   [name], NP  potassium chloride SA (K-DUR,KLOR-CON) 20 MEQ tablet TK 1 T PO QD 10/08/18   [provider]  triamcinolone cream (KENALOG) 0.1 % APPLY TO THE AFFECTED AREA TWICE DAILY 06/03/18   Clent Demark, PA-C  vitamin B-12 (CYANOCOBALAMIN) 500 MCG tablet Take 1,000 mcg by mouth. sometimes    [provider]    Family History Family History  Problem Relation Age of Onset  . Breast cancer Sister     Social History Social History   Tobacco Use  . Smoking status: Never Smoker  . Smokeless tobacco: Never Used  Substance Use Topics  . Alcohol use: Yes    Comment: sometimes wine  . Drug use: No     Allergies   Patient has no known allergies.   Review of Systems Review of Systems  Constitutional: Positive for activity change and appetite change. Negative for chills, diaphoresis, fatigue and fever.  HENT: Negative.   Eyes: Negative.   Respiratory: Negative.   Cardiovascular: Negative.   Gastrointestinal: Negative.   Endocrine: Negative.   Genitourinary: Negative.   Musculoskeletal: Negative.   Skin: Negative.   Allergic/Immunologic: Negative.   Neurological: Negative.   Hematological: Negative.   Psychiatric/Behavioral: Negative.      Physical Exam Triage Vital Signs ED Triage Vitals  Enc Vitals Group     BP 10/23/18 1453 (!) 145/70     Pulse Rate 10/23/18 1453 86     Resp 10/23/18 1453 16     Temp 10/23/18 1453 98.7 F (37.1 C)     Temp Source 10/23/18 1453 Oral     SpO2 10/23/18 1453 98 %     Weight --       Height --      Head Circumference --      Peak Flow --      Pain Score 10/23/18 1559 0     Pain Loc --      Pain Edu? --      Excl. in [place]? --    No data found.  Updated Vital Signs BP (!) 145/70 (BP Location: Right Arm)   Pulse 86   Temp 98.7 F (37.1 C) (Oral)   Resp 16   SpO2 98%   Visual Acuity Right Eye Distance:   Left Eye Distance:   Bilateral Distance:    Right Eye  Near:   Left Eye Near:    Bilateral Near:     Physical Exam Constitutional:      General: She is not in acute distress.    Appearance: Normal appearance. She is ill-appearing. She is not toxic-appearing or diaphoretic.  HENT:     Nose: No congestion or rhinorrhea.     Mouth/Throat:     Mouth: Mucous membranes are moist.     Pharynx: No oropharyngeal exudate or posterior oropharyngeal erythema.  Neck:     Musculoskeletal: Normal range of motion. No neck rigidity.  Cardiovascular:     Rate and Rhythm: Normal rate.     Pulses: Normal pulses.     Heart sounds: Normal heart sounds. No murmur. No friction rub. No gallop.   Pulmonary:     Effort: Pulmonary effort is normal. No respiratory distress.     Breath sounds: No wheezing.  Abdominal:     General: Bowel sounds are normal.     Palpations: Abdomen is soft.  Musculoskeletal: Normal range of motion.        General: No swelling or deformity.     Right lower leg: No edema.     Left lower leg: No edema.  Lymphadenopathy:     Cervical: No cervical adenopathy.  Skin:    General: Skin is warm.     Capillary Refill: Capillary refill takes less than 2 seconds.  Neurological:     General: No focal deficit present.     Mental Status: She is alert and oriented to person, place, and time.  Psychiatric:        Mood and Affect: Mood normal.        Behavior: Behavior normal.      UC Treatments / Results  Labs (all labs ordered are listed, but only abnormal results are displayed) Labs Reviewed  TSH  VITAMIN D 25 HYDROXY (VIT D DEFICIENCY,  FRACTURES)  CK    EKG None  Radiology No results found.  Procedures Procedures (including critical care time)  Medications Ordered in UC Medications - No data to display  Initial Impression / Assessment and Plan / UC Course  I have reviewed the triage vital signs and the nursing notes.  Pertinent labs & imaging results that were available during my care of the patient were reviewed by me and considered in my medical decision making (see chart for details).     1.  Fatigue: TSH, vitamin D level, creatinine kinase level Continue statin We will call patient with labs.  Final Clinical Impressions(s) / UC Diagnoses   Final diagnoses:  Fatigue, unspecified type   Discharge Instructions   None    ED Prescriptions    None     Controlled Substance Prescriptions [place] Controlled Substance Registry consulted? No   Chase Picket, MD 10/23/18 [phone]

## 2018-10-23 NOTE — ED Triage Notes (Signed)
Pt C/O been very fatigue.  Symptoms has been going on for almost 2 wks or longer.

## 2018-10-24 LAB — VITAMIN D 25 HYDROXY (VIT D DEFICIENCY, FRACTURES): Vit D, 25-Hydroxy: 47.4 ng/mL (ref 30.0–100.0)

## 2018-10-27 ENCOUNTER — Telehealth (HOSPITAL_COMMUNITY): Payer: Self-pay | Admitting: Internal Medicine

## 2018-10-27 MED ORDER — MELATONIN 5 MG PO CHEW: 5.0000 mg | CHEWABLE_TABLET | Freq: Every day | ORAL | 2 refills | Status: AC

## 2018-10-27 NOTE — Telephone Encounter (Signed)
I called [name] and relayed laboratory results to her. She is doing better at this time. No new concerns except for difficulty sleeping.Her questions about Vitamin D,B12 use were addressed.Patient asked about sleep aid use. Non-habit forming sleep aid will be sent to pharmacy on file for pick up.

## 2018-10-28 ENCOUNTER — Telehealth: Payer: Self-pay | Admitting: Adult Health

## 2018-10-28 ENCOUNTER — Ambulatory Visit: Payer: Self-pay | Admitting: Family Medicine

## 2018-10-28 NOTE — Telephone Encounter (Signed)
LMVM for pt tor return call about cpap.  Set up appt VV or telephone with Debbora Presto, NP Due to current COVID 19 pandemic, our office is severely reducing in office visits for at least the next 2 weeks, in order to minimize the risk to our patients and healthcare providers and  as MM/NP out on maternity leave.

## 2018-10-28 NOTE — Telephone Encounter (Addendum)
Spoke to pt.   She is having issues with sleep.  Not being able to sleep. BP ok, having headaches/dizziness from not sleeping. Appetite good.   She was given melatonin to help fall asleep   But she stated she stopped taking after several days.   She did call [facility] re: her cpap and has not heard back.  She is asking for medication to help with sleep.  I made telephone visit for her today at 1530, due to mistake on my part, r/s to 0730 10/29/18 to discuss.    She did consent to telephone visit.  She did not have access for virtual video.  Due to current COVID 19 pandemic, our office is severely reducing in office visits for at least the next 2 weeks, in order to minimize the risk to our patients and healthcare providers.  Pt understands that although there may be some limitations with this type of visit, we will take all precautions to reduce any security or privacy concerns.  Pt understands that this will be treated like an in office visit and we will file with pt's insurance.

## 2018-10-28 NOTE — Telephone Encounter (Signed)
Pt states since Friday she has been unable to sleep even with CPAP on.  Pt states she has to drink Nyquill to fall asleep.  Pt states her CPAP is set on # 8, she is unsure if she needs more air or what.  Pt is asking for a call as to what she needs to do to gain some sleep.

## 2018-10-28 NOTE — Addendum Note (Signed)
Addended by: Brandon Melnick on: 10/28/2018 03:10 PM   Modules accepted: Orders

## 2018-10-29 ENCOUNTER — Telehealth: Payer: Self-pay | Admitting: Primary Care

## 2018-10-29 ENCOUNTER — Ambulatory Visit (INDEPENDENT_AMBULATORY_CARE_PROVIDER_SITE_OTHER): Payer: Medicare Other | Admitting: Family Medicine

## 2018-10-29 ENCOUNTER — Encounter: Payer: Self-pay | Admitting: Family Medicine

## 2018-10-29 ENCOUNTER — Other Ambulatory Visit: Payer: Self-pay

## 2018-10-29 DIAGNOSIS — R630 Anorexia: Secondary | ICD-10-CM

## 2018-10-29 DIAGNOSIS — R5383 Other fatigue: Secondary | ICD-10-CM

## 2018-10-29 DIAGNOSIS — R531 Weakness: Secondary | ICD-10-CM

## 2018-10-29 NOTE — Progress Notes (Addendum)
PATIENT: Rachel Crane DOB: Aug 22, 1945  REASON FOR VISIT: follow up HISTORY FROM: patient  Virtual Visit via Telephone Note  I connected with Rodney Cruise on 10/29/18 at  7:30 AM EDT by telephone and verified that I am speaking with the correct person using two identifiers.   I discussed the limitations, risks, security and privacy concerns of performing an evaluation and management service by telephone and the availability of in person appointments. I also discussed with the patient that there may be a patient responsible charge related to this service. The patient expressed understanding and agreed to proceed.   History of Present Illness:  10/29/18 Rachel Crane is a 73 y.o. female for concerns of weakness, decreased appetite, fatigue, trouble sleeping and intermittent headaches.  She was originally seen by Dr. Rexene Alberts in 2018 for persistent headaches thought to be related to sleep apnea.  She was started on CPAP therapy at that time and reports that her sleep significantly improved.  About 3 weeks ago she was laid off from her job.  She reports that shortly after she started having trouble with feeling very tired and was having significant weakness.  She was seen in the emergency room last week for concerns of weakness and muscle aches.  Lab work was ordered including TSH, CK and vitamin D which was all normal.  She also reported at that time that she had been recently started on a statin medication.  I do not see this listed in her med rec.  She is unable to tell me the name of the medication.  After further review she states that she was started on this medication about 3 months ago.  She is no longer taking the statin but is unable to tell me when she stopped this medication.  She states that she cannot remember.  When asked about her sleeping habits she reports lying down around 9:00 PM.  She reports that it usually takes about 15 minutes to fall asleep.  She does get up once during the night to  urinate.  She is able to fall back asleep fairly quickly.  She feels rested in the morning when she wakes up.  She reports that throughout the day she will start to feel more sluggish.  Her appetite waxes and wanes.  She will occasionally have sharp shooting pain in her head but this is very infrequent.  She reports feeling very weak but has noted improvement since being seen in the ER last week.  She was advised to start melatonin 5 mg at bedtime.  She has not picked up that medication.  She has not reached out to her primary care provider.  She does live alone.  She expresses to me that she does not like living in a large home alone.  She denies any concerns of fever, urinary symptoms, respiratory symptoms, chest pain, shortness of breath.  She no longer has muscle aches.  CPAP download as notated below looks good.   Download reports reveals:  Compliance Report 09/28/2018-10/27/2018 Usage 09/28/2018 - 10/27/2018 Usage days 30/30 days (100%) >= 4 hours 29 days (97%) < 4 hours 1 days (3%) Usage hours 194 hours 54 minutes Average usage (total days) 6 hours 30 minutes Average usage (days used) 6 hours 30 minutes Median usage (days used) 6 hours 15 minutes Total used hours (value since last reset - 10/27/2018) 596 hours AirSense 10 CPAP Serial number [id number] Mode CPAP Set pressure 8 cmH2O EPR Fulltime EPR level 2 Therapy Leaks - L/min  95th percentile: 3.9 Events per hour AHI: 3.3   HISTORY (copied from [facility] note on 05/28/2018)  Ms. [name] is a 73 year old female with a history of obstructive sleep apnea on CPAP.  She returns today for follow-up.  Her download indicates that she use her machine 30 out of 30 days for compliance of 100%.  She used her machine greater than 4 hours 27 days for compliance of 90%.  She used her machine on average 7 hours and 17 minutes.  Her residual AHI is 5.8 on 8 cm of water with EPR 2.  She states that there are some nights she forgets to put it  on as soon as she falls asleep and ends up putting it on later.  She has been having trouble getting new supplies from her DME company.  She returns today for evaluation.  HISTORY (copied from Dr. Guadelupe Sabin present in the days for 7 hours each night pressures at a) 12/12/2017: I reviewed her CPAP compliance data from 11/11/2017 through 12/10/2017 which is a total of 30 days, during which time she used her CPAP 29 days with percent used days greater than 4 hours at 97%, indicating excellent compliance with an average usage of 7 hours and 9 minutes, residual AHI at goal at 1.9 per hour, leak acceptable with the 95thpercentile at 14.5 L/m on a pressure of 8 cm with EPR of 2. She reportsdoing well with her CPAP.Has had a little bit of fluctuation in her weight by a few pounds up and down, she had knee replacement surgery on the right in the interim with good results thus far, she has physical therapy currently outpatient twice a week. She is motivated to exercise more and try to lose weight. She is willing to continue with CPAP therapy, headaches are essentially gone. Her children are scattered a little bit, she has a son in [place], she visited him recently and forgot her CPAP machine that the only time she missed a night recently. She has a daughter who is a physician in [place], one daughter is a school principal and lives in [place], 1 daughter in [place] works for [employer]. She does not drive long distance. Her kids typically come and visit or take her and bring her back. her blood pressure Is generally under control   Observations/Objective:  Generalized: Well developed, in no acute distress  Mentation: Alert oriented to time, place, history taking. Follows all commands speech and language fluent   Assessment and Plan:  73 y.o. year old female  has a past medical history of Abnormal stress ECG (10/06/2018), Aortic valve sclerosis (10/06/2018), Arthritis, Bilateral carotid bruits (10/06/2018),  Headache, Hypertension, Laboratory examination (10/06/2018), and OSA on CPAP. with    ICD-10-CM   1. Fatigue, unspecified type R53.83   2. Weakness R53.1   3. Decreased appetite R63.0    Ms. Wrench complains of multiple symptoms including weakness, fatigue, decreased appetite and intermittent pain.  Review of CPAP download over the last 30 days shows excellent compliance.  There is no significant leak.  AHI is within normal range.  When discussing concerns of sleep she is able to get to sleep within 15 minutes of lying down. She is waking up once during the middle the night but is able to get back to sleep.  She does feel rested in the morning.  She was recently seen in the ER and labs were unremarkable.  I have reviewed her labs from primary care in March and noted that her  potassium was low at that time.  She is taking potassium supplements orally.  She has not had this rechecked.  Although she denies any concerns for infectious causes of symptoms I have advised an urgent follow-up with her primary care to consider testing urine, CBC and CMP.  It is mildly reassuring that she is feeling somewhat better over the past few days.  I am also concerned that her symptoms could be related to anxiety or depression.  She admits to feeling a little anxious but denies depression.  Mrs. Salo reports that she will call today for an appointment with her primary care provider Mrs. Juluis Mire, NP.  She was advised that I will also send my notes regarding today's visit over for review.  I have advised that she start melatonin 5 mg as previously prescribed by the ER provider last week.  I advised that she reach back out to me should symptoms worsen or if she is unable to secure an appointment with primary care.  She verbalizes understanding and agreement with this plan.   No orders of the defined types were placed in this encounter.   No orders of the defined types were placed in this encounter.    Follow Up  Instructions:  I discussed the assessment and treatment plan with the patient. The patient was provided an opportunity to ask questions and all were answered. The patient agreed with the plan and demonstrated an understanding of the instructions.   The patient was advised to call back or seek an in-person evaluation if the symptoms worsen or if the condition fails to improve as anticipated.  I provided 30 minutes of non-face-to-face time during this encounter.  Patient is located at her place of residence during teleconference.  Provider is located at her place of residence.  Liane Comber, RN help to facilitate visit.   Debbora Presto, NP   I reviewed the above note and documentation by the Nurse Practitioner and agree with the history, assessment and plan as outlined above. I was immediately available for a phone consultation. Star Age, MD, PhD [facility])

## 2018-10-29 NOTE — Telephone Encounter (Signed)
Patient has been elevated by neurology and felt this was not a neuro problem . However her s/s are. She maybe depressed from staying home. She may benefit from speaking with [facility]

## 2018-10-29 NOTE — Telephone Encounter (Signed)
New Message   Pt states she feels like she will black out and wants to speak with someone regarding her symptoms

## 2018-10-29 NOTE — Telephone Encounter (Signed)
Error

## 2018-10-29 NOTE — Telephone Encounter (Signed)
Patients call returned.  Patient identified by name and date of birth.  Patient called after talking with neurology.  Patient states that she feels like she is going to black out when she lays down to sleep.  Patient state she feels pain in the middle of her head. Patient denies chest pain, shortness of breath or any other respiratory issues.  Patient states she recently has been confined to her house.  She states she is an outside person and feel better when she is outside. Patient denies any bowel or urinary issues, or musculoskeletal issues  Neurology did have CPAP machine was check and found to be in working order.  Patient has been seen at Urgent care and had a two telephone encounters with neurology.  Last visit with them she was advised to contact PCP for further evaluation.  Patient states she feels something is wrong.  Patient advised that Doctor would be advised, an appointment would be arranged and she would be advised as to it's status,  Patient acknowledged understanding of advise.

## 2018-10-29 NOTE — Telephone Encounter (Signed)
Please schedule for a tele on tomorrow with PCP

## 2018-10-30 ENCOUNTER — Other Ambulatory Visit: Payer: Self-pay

## 2018-10-30 ENCOUNTER — Encounter: Payer: Self-pay | Admitting: Primary Care

## 2018-10-30 ENCOUNTER — Ambulatory Visit: Payer: Medicare Other | Attending: Primary Care | Admitting: Primary Care

## 2018-10-30 DIAGNOSIS — Z79899 Other long term (current) drug therapy: Secondary | ICD-10-CM | POA: Insufficient documentation

## 2018-10-30 DIAGNOSIS — M1990 Unspecified osteoarthritis, unspecified site: Secondary | ICD-10-CM | POA: Insufficient documentation

## 2018-10-30 DIAGNOSIS — G4733 Obstructive sleep apnea (adult) (pediatric): Secondary | ICD-10-CM | POA: Insufficient documentation

## 2018-10-30 DIAGNOSIS — Z7982 Long term (current) use of aspirin: Secondary | ICD-10-CM | POA: Diagnosis not present

## 2018-10-30 DIAGNOSIS — F320 Major depressive disorder, single episode, mild: Secondary | ICD-10-CM | POA: Diagnosis not present

## 2018-10-30 DIAGNOSIS — F419 Anxiety disorder, unspecified: Secondary | ICD-10-CM | POA: Insufficient documentation

## 2018-10-30 DIAGNOSIS — F329 Major depressive disorder, single episode, unspecified: Secondary | ICD-10-CM | POA: Insufficient documentation

## 2018-10-30 DIAGNOSIS — R51 Headache: Secondary | ICD-10-CM | POA: Insufficient documentation

## 2018-10-30 DIAGNOSIS — I10 Essential (primary) hypertension: Secondary | ICD-10-CM | POA: Insufficient documentation

## 2018-10-30 DIAGNOSIS — F5101 Primary insomnia: Secondary | ICD-10-CM | POA: Diagnosis not present

## 2018-10-30 DIAGNOSIS — R55 Syncope and collapse: Secondary | ICD-10-CM | POA: Diagnosis not present

## 2018-10-30 MED ORDER — ESCITALOPRAM OXALATE 10 MG PO TABS: 10.0000 mg | ORAL_TABLET | Freq: Every day | ORAL | 0 refills | Status: DC

## 2018-10-30 NOTE — Progress Notes (Signed)
Acute Office Visit  Subjective:    Patient ID: Rachel Crane, female    DOB: 03-09-1946, 73 y.o.   MRN: [id number]  Chief Complaint  Patient presents with   Near Syncope    Patient is having a tele visit due to several visit and tele visit with the neurologist which she voiced concerns of weakness, decreased appetite, fatigue, trouble sleeping and intermittent headaches. Approximately a month ago due to her age and risk of COVID-19 she was laid off from her job.  Since than she has been in the house family ie children are all using social distancing , they call her often but she remains lonely and home alone. Discussed in detail her feelings were normal fear, alone and no place to go or be. Stated these are not neurological signs but anxiety and depression. After a long extended conversation and intervention from a [place] physician speaking their language she agreed to take a low dose of SSRI. Time spent 1:66mins   Past Medical History:  Diagnosis Date   Abnormal stress ECG 10/06/2018   Aortic valve sclerosis 10/06/2018   Arthritis    Bilateral carotid bruits 10/06/2018   Headache    H/O bad HA, since using CPAP- she has had improvement with that issue    Hypertension    Laboratory examination 10/06/2018   OSA on CPAP    settting - 8, uses CPAP q night     Past Surgical History:  Procedure Laterality Date   CHOLECYSTECTOMY  1998   TOTAL KNEE ARTHROPLASTY Right 09/19/2017   Procedure: RIGHT TOTAL KNEE ARTHROPLASTY;  Surgeon: Leandrew Koyanagi, MD;  Location: [facility];  Service: Orthopedics;  Laterality: Right;   TUBAL LIGATION      Family History  Problem Relation Age of Onset   Breast cancer Sister     Social History   Socioeconomic History   Marital status: Widowed    Spouse name: Not on file   Number of children: 3   Years of education: Not on file   Highest education level: Not on file  Occupational History   Not on file  Social Needs   Financial resource  strain: Not on file   Food insecurity:    Worry: Not on file    Inability: Not on file   Transportation needs:    Medical: Not on file    Non-medical: Not on file  Tobacco Use   Smoking status: Never Smoker   Smokeless tobacco: Never Used  Substance and Sexual Activity   Alcohol use: Yes    Comment: sometimes wine   Drug use: No   Sexual activity: Not Currently  Lifestyle   Physical activity:    Days per week: Not on file    Minutes per session: Not on file   Stress: Not on file  Relationships   Social connections:    Talks on phone: Not on file    Gets together: Not on file    Attends religious service: Not on file    Active member of club or organization: Not on file    Attends meetings of clubs or organizations: Not on file    Relationship status: Not on file   Intimate partner violence:    Fear of current or ex partner: Not on file    Emotionally abused: Not on file    Physically abused: Not on file    Forced sexual activity: Not on file  Other Topics Concern   Not on file  Social History Narrative   Not on file    Outpatient Medications Prior to Visit  Medication Sig Dispense Refill   aspirin EC 81 MG tablet Take 1 tablet (81 mg total) by mouth daily. 90 tablet 3   azelastine (ASTELIN) 0.1 % nasal spray Place 2 sprays into both nostrils 2 (two) times daily. 30 mL 5   chlorthalidone (HYGROTON) 25 MG tablet Take 1 tablet (25 mg total) by mouth daily. 90 tablet 1   fluticasone (FLONASE) 50 MCG/ACT nasal spray Place 2 sprays into both nostrils daily. 1 g 5   latanoprost (XALATAN) 0.005 % ophthalmic solution INT 1 GTT IN OU QD IN THE EVE  10   losartan (COZAAR) 100 MG tablet Take 1 tablet (100 mg total) by mouth daily. 30 tablet 3   Melatonin 5 MG CHEW Chew 5 mg by mouth daily for 30 days. 30 tablet 2   Multiple Vitamins-Minerals (WOMENS 50+ MULTI VITAMIN/MIN PO) Take by mouth daily.     potassium chloride SA (K-DUR,KLOR-CON) 20 MEQ tablet TK 1 T  PO QD     triamcinolone cream (KENALOG) 0.1 % APPLY TO THE AFFECTED AREA TWICE DAILY 454 g 0   nitroGLYCERIN (NITROSTAT) 0.4 MG SL tablet Place 1 tablet (0.4 mg total) under the tongue every 5 (five) minutes as needed for chest pain. (Patient not taking: Reported on 10/28/2018) 50 tablet 3   No facility-administered medications prior to visit.     No Known Allergies  Review of Systems  Constitutional: Negative.   HENT: Negative.   Eyes: Negative.   Respiratory: Negative.   Cardiovascular: Negative.   Gastrointestinal: Negative.   Genitourinary: Negative.   Musculoskeletal: Negative.   Skin: Negative.   Neurological: Positive for dizziness and headaches.  Psychiatric/Behavioral: Positive for depression. The patient is nervous/anxious and has insomnia.        Objective:    Physical Exam  There were no vitals taken for this visit. Wt Readings from Last 3 Encounters:  10/10/18 200 lb 12.8 oz (91.1 kg)  10/06/18 201 lb (91.2 kg)  09/09/18 204 lb (92.5 kg)    There are no preventive care reminders to display for this patient.  There are no preventive care reminders to display for this patient.   Lab Results  Component Value Date   TSH 0.980 10/23/2018   Lab Results  Component Value Date   WBC 6.4 10/06/2018   HGB 11.2 10/06/2018   HCT 33.8 (L) 10/06/2018   MCV 81 10/06/2018   PLT 220 05/12/2018   Lab Results  Component Value Date   NA 142 10/06/2018   K 3.2 (L) 10/06/2018   CO2 25 10/06/2018   GLUCOSE 107 (H) 10/06/2018   BUN 19 10/06/2018   CREATININE 1.07 (H) 10/06/2018   BILITOT 0.4 10/06/2018   ALKPHOS 74 10/06/2018   AST 21 10/06/2018   ALT 20 10/06/2018   PROT 7.3 10/06/2018   ALBUMIN 4.3 10/06/2018   CALCIUM 9.8 10/06/2018   ANIONGAP 10 09/20/2017   Lab Results  Component Value Date   CHOL 179 10/06/2018   Lab Results  Component Value Date   HDL 50 10/06/2018   Lab Results  Component Value Date   LDLCALC 114 (H) 10/06/2018   Lab  Results  Component Value Date   TRIG 76 10/06/2018   Lab Results  Component Value Date   CHOLHDL 3.6 10/06/2018   No results found for: HGBA1C     Assessment & Plan:   Problem List Items  Addressed This Visit    None    Visit Diagnoses    Anxiety    -  Primary   Relevant Medications   escitalopram (LEXAPRO) 10 MG tablet   Current mild episode of major depressive disorder without prior episode (HCC)       Relevant Medications   escitalopram (LEXAPRO) 10 MG tablet   Primary insomnia        Marianne was seen today for near syncope.  Diagnoses and all orders for this visit:  Anxiety  initial visit. Onset was 1 to 4 weeks ago. The problem has been gradually worsening. Symptoms include depressed mood, dizziness, excessive worry, insomnia and nervous/anxious behavior. Primary symptoms comment: feels like shes going to pass out. Symptoms occur occasionally. The severity of symptoms is interfering with daily activities, causing significant distress and incapacitating. The symptoms are aggravated by social activities. The quality of sleep is fair. Nighttime awakenings: several.   Current mild episode of major depressive disorder without prior episode ([place]) Start low dose of Lexapro dual purpose anxiety and depression major life event (Not working in house no social interractions ). Her past medical history is significant for depression. Treatments tried: OTC meloatonin  The treatment provided mild relief. Compliance with prior treatments has been good. Prior compliance problems include medical issues.   Primary insomnia Continue melatonin OTC if Lexapro does not make her sleepy   Other orders -     escitalopram (LEXAPRO) 10 MG tablet; Take 1 tablet (10 mg total) by mouth daily.     Meds ordered this encounter  Medications   escitalopram (LEXAPRO) 10 MG tablet    Sig: Take 1 tablet (10 mg total) by mouth daily.    Dispense:  30 tablet    Refill:  0     [name], NP

## 2018-10-30 NOTE — Progress Notes (Signed)
lPatient verified DOB Patient has taken medication today  Patient has eaten today.

## 2018-11-03 ENCOUNTER — Ambulatory Visit: Payer: Medicare Other | Attending: Primary Care | Admitting: Licensed Clinical Social Worker

## 2018-11-03 DIAGNOSIS — F419 Anxiety disorder, unspecified: Secondary | ICD-10-CM

## 2018-11-06 ENCOUNTER — Other Ambulatory Visit: Payer: Self-pay

## 2018-11-07 NOTE — BH Specialist Note (Signed)
Integrated Behavioral Health Visit via Telemedicine (Telephone)  11/03/2018 Rachel Crane [id number]   Session Start time: 1:23 PM  Session End time: 1:48 PM Total time: 25 minutes  Referring Provider: NP Oletta Lamas Type of Visit: Telephonic Patient location: Home [facility] Provider location: Office All persons participating in visit: Pt  Confirmed patient's address: Yes  Confirmed patient's phone number: Yes  Any changes to demographics: No   Confirmed patient's insurance: Yes  Any changes to patient's insurance: No   Discussed confidentiality: Yes    The following statements were read to the patient and/or legal guardian that are established with the [facility] Provider.  "The purpose of this phone visit is to provide behavioral health care while limiting exposure to the coronavirus (COVID19).  There is a possibility of technology failure and discussed alternative modes of communication if that failure occurs."  "By engaging in this telephone visit, you consent to the provision of healthcare.  Additionally, you authorize for your insurance to be billed for the services provided during this telephone visit."   Patient and/or legal guardian consented to telephone visit: Yes   PRESENTING CONCERNS: Patient and/or family reports the following symptoms/concerns: Pt reported difficulty sleeping and overwhelming feelings of worry due to COVID19 Duration of problem: weeks; Severity of problem: mild  STRENGTHS (Protective Factors/Coping Skills): Pt has a support system Pt identified healthy coping skills that she utilizes daily  GOALS ADDRESSED: Patient will: 1.  Reduce symptoms of: anxiety  2.  Increase knowledge and/or ability of: coping skills  3.  Demonstrate ability to: Increase healthy adjustment to current life circumstances  INTERVENTIONS: Interventions utilized:  Solution-Focused Strategies and Psychoeducation and/or Health Education Standardized Assessments completed:  Not Needed  ASSESSMENT: Patient currently experiencing anxiety triggered by fears related to [phone]. She reports overwhelming feelings of worry and difficulty sleeping. Pt has support system that consists of neighbors, adult children [facility] and [place]) and extended family. Denies hx of suicidal or homicidal ideations.    LCSWA discussed therapeutic interventions to assist in the decrease and/or management of symptoms. Pt shared that she has yet to pick up Lexapro from pharmacy and will discuss possible side effects with pharmacist. States melatonin is not effective. Pt agreed to utilize healthy coping skills (walking, gardening, drinking tea, and watching tv)  PLAN: 1. Follow up with behavioral health clinician on : Pt was encouraged to contact [facility] with any questions or concerns. Encouraged to schedule follow up appointment, if needed 2. Behavioral recommendations: Utilize healthy coping skills and comply with medication management 3. Referral(s): [facility] (In Clinic)  Rebekah [place], [place] 11/07/2018 3:34 PM

## 2018-11-10 ENCOUNTER — Ambulatory Visit (HOSPITAL_BASED_OUTPATIENT_CLINIC_OR_DEPARTMENT_OTHER): Payer: Medicare Other | Admitting: Licensed Clinical Social Worker

## 2018-11-10 ENCOUNTER — Other Ambulatory Visit: Payer: Self-pay

## 2018-11-10 ENCOUNTER — Ambulatory Visit: Payer: Medicare Other | Attending: Primary Care

## 2018-11-10 DIAGNOSIS — F419 Anxiety disorder, unspecified: Secondary | ICD-10-CM

## 2018-11-10 DIAGNOSIS — E876 Hypokalemia: Secondary | ICD-10-CM | POA: Diagnosis not present

## 2018-11-11 ENCOUNTER — Telehealth: Payer: Self-pay | Admitting: Primary Care

## 2018-11-11 LAB — COMPREHENSIVE METABOLIC PANEL
ALT: 18 IU/L (ref 0–32)
AST: 18 IU/L (ref 0–40)
Albumin/Globulin Ratio: 1.8 (ref 1.2–2.2)
Albumin: 4.6 g/dL (ref 3.7–4.7)
Alkaline Phosphatase: 78 IU/L (ref 39–117)
BUN/Creatinine Ratio: 14 (ref 12–28)
BUN: 15 mg/dL (ref 8–27)
Bilirubin Total: 0.5 mg/dL (ref 0.0–1.2)
CO2: 26 mmol/L (ref 20–29)
Calcium: 9.7 mg/dL (ref 8.7–10.3)
Chloride: 102 mmol/L (ref 96–106)
Creatinine, Ser: 1.07 mg/dL — ABNORMAL HIGH (ref 0.57–1.00)
GFR calc Af Amer: 60 mL/min/{1.73_m2} (ref >59)
GFR calc non Af Amer: 52 mL/min/{1.73_m2} — ABNORMAL LOW (ref >59)
Globulin, Total: 2.6 g/dL (ref 1.5–4.5)
Glucose: 98 mg/dL (ref 65–99)
Potassium: 3.6 mmol/L (ref 3.5–5.2)
Sodium: 144 mmol/L (ref 134–144)
Total Protein: 7.2 g/dL (ref 6.0–8.5)

## 2018-11-11 NOTE — Telephone Encounter (Signed)
New Message   Pt is calling to check on the status of her blood work. Please f/u

## 2018-11-11 NOTE — Telephone Encounter (Signed)
Patient verified DOB Patient is aware of the results and needed to schedule another tele due to still not sleeping and she stopped the anxiety medication after two days use.

## 2018-11-12 ENCOUNTER — Other Ambulatory Visit: Payer: Self-pay

## 2018-11-12 ENCOUNTER — Ambulatory Visit: Payer: Medicare Other | Attending: Primary Care | Admitting: Primary Care

## 2018-11-12 ENCOUNTER — Encounter: Payer: Self-pay | Admitting: Primary Care

## 2018-11-12 DIAGNOSIS — Z79899 Other long term (current) drug therapy: Secondary | ICD-10-CM | POA: Insufficient documentation

## 2018-11-12 DIAGNOSIS — F320 Major depressive disorder, single episode, mild: Secondary | ICD-10-CM

## 2018-11-12 DIAGNOSIS — F329 Major depressive disorder, single episode, unspecified: Secondary | ICD-10-CM | POA: Diagnosis not present

## 2018-11-12 DIAGNOSIS — F419 Anxiety disorder, unspecified: Secondary | ICD-10-CM | POA: Insufficient documentation

## 2018-11-12 MED ORDER — TRAZODONE HCL 50 MG PO TABS: 25.0000 mg | ORAL_TABLET | Freq: Every day | ORAL | 3 refills | Status: DC

## 2018-11-12 NOTE — Progress Notes (Signed)
Patient verified DOB Patient has taken medication today. Patient has eaten today. Patient has not pain.

## 2018-11-12 NOTE — Progress Notes (Signed)
Virtual Visit via Telephone Note  I connected with Rachel Crane on 11/12/18 at  9:30 AM EDT by telephone and verified that I am speaking with the correct person using two identifiers.   I discussed the limitations, risks, security and privacy concerns of performing an evaluation and management service by telephone and the availability of in person appointments. I also discussed with the patient that there may be a patient responsible charge related to this service. The patient expressed understanding and agreed to proceed.   History of Present Illness: Rachel Crane has been needing reassurance of unwarranted neurological problems . Seen by neurology no significant findings. Explained to her this is her fear and anxiety from not working and being in the house and afraid of getting COVID-19. I recently prescribe a low dose of lexapro she took it twice and stated both times she " threw up". Explained I felt a therapist would be beneficial for her and I would refer here to one.    Observations/Objective: Review of Systems  Constitutional: Negative.   HENT: Negative.   Eyes: Negative.   Respiratory: Negative.   Cardiovascular: Negative.   Gastrointestinal: Negative.   Genitourinary: Negative.   Musculoskeletal: Negative.   Skin: Negative.   Neurological: Positive for weakness and headaches.  Endo/Heme/Allergies: Negative.   Psychiatric/Behavioral: Positive for depression. The patient is nervous/anxious and has insomnia.     Assessment and Plan: Rachel Crane was seen today for anxiety.  Diagnoses and all orders for this visit:  Anxiety See HPI -     Ambulatory referral to Psychiatry  Current mild episode of major depressive disorder without prior episode (Rachel Crane) See HPI -     Ambulatory referral to Psychiatry  Other orders -     traZODone (DESYREL) 50 MG tablet; Take 0.5 tablets (25 mg total) by mouth at bedtime.    Follow Up Instructions:    I discussed the assessment and treatment plan  with the patient. The patient was provided an opportunity to ask questions and all were answered. The patient agreed with the plan and demonstrated an understanding of the instructions.   The patient was advised to call back or seek an in-person evaluation if the symptoms worsen or if the condition fails to improve as anticipated.  I provided 20 minutes of non-face-to-face time during this encounter. Time spent trying to convince her she may need therapy.   [name], NP

## 2018-11-17 ENCOUNTER — Ambulatory Visit: Payer: Medicare Other | Admitting: Primary Care

## 2018-11-17 ENCOUNTER — Encounter: Payer: Self-pay | Admitting: Primary Care

## 2018-11-17 NOTE — BH Specialist Note (Signed)
Pt came in to office for labwork. LCSWA introduced self (face to face) and inquired about how she was feeling. Pt shared that she is feeling better; however, is not interested in continuing medication (Lexapro) recently prescribed due to side effects. LCSWA commended pt for trying medication and encouraged her to discuss concerns with PCP. Pt shared that she has returned to work part-time and is utilizing healthy coping skills to address anxiety. No additional concerns noted.   Pt requested a referral to GYN at [address] location. Message sent to PCP to address.

## 2018-11-21 ENCOUNTER — Encounter: Payer: Self-pay | Admitting: Cardiology

## 2018-11-21 ENCOUNTER — Ambulatory Visit (INDEPENDENT_AMBULATORY_CARE_PROVIDER_SITE_OTHER): Payer: Medicare Other | Admitting: Cardiology

## 2018-11-21 ENCOUNTER — Other Ambulatory Visit: Payer: Self-pay

## 2018-11-21 VITALS — BP 120/66 | Ht 66.0 in | Wt 180.0 lb

## 2018-11-21 DIAGNOSIS — R002 Palpitations: Secondary | ICD-10-CM

## 2018-11-21 DIAGNOSIS — R0789 Other chest pain: Secondary | ICD-10-CM | POA: Diagnosis not present

## 2018-11-21 DIAGNOSIS — F419 Anxiety disorder, unspecified: Secondary | ICD-10-CM | POA: Diagnosis not present

## 2018-11-21 DIAGNOSIS — I10 Essential (primary) hypertension: Secondary | ICD-10-CM

## 2018-11-21 NOTE — Progress Notes (Signed)
Subjective:   Rachel Crane, female    DOB: [birth year], 73 y.o.   MRN: [id number]  Rachel Perna, NP:  Chief Complaint  Patient presents with   Palpitations    monitor f/u    This visit type was conducted due to national recommendations for restrictions regarding the COVID-19 Pandemic (e.g. social distancing).  This format is felt to be most appropriate for this patient at this time.  All issues noted in this document were discussed and addressed.  No physical exam was performed (except for noted visual exam findings with Telehealth visits).  The patient has consented to conduct a Telehealth visit and understands insurance will be billed.   I discussed the limitations of evaluation and management by telemedicine and the availability of in person appointments. The patient expressed understanding and agreed to proceed.  Virtual Visit via Video Note is as below  I connected with Rachel Crane, on 11/21/18 at 1020 by telephone and verified that I am speaking with the correct person using two identifiers. Patient unable to perform video visit as she did not have equipment.    I have discussed with her regarding the safety during COVID Pandemic and steps and precautions including social distancing with the patient.    HPI: Rachel Crane  is a 73 y.o. female  with past medical history of hypertension, prediabetes, sleep apnea currently on CPAP, remote history of aortic valve sclerosis by previous echo; however, not noted by echo in 2018. No history of tobacco or alcohol use.  Patient underwent Lexiscan nuclear stress test in August 2018 that although had exaggerated blood pressure response and poor exercise capacity on routine treadmill stress test, had normal perfusion by nuclear stress test. She was started on statin therapy at that time in view of her risk factors and soft plaque noted on carotid duplex.   Recently seen for worsening palpitations, described as heart racing. She was  placed on 2 week event monitor that was without any arrhythmias. She is now here to discuss results.   She was recently seen by her PCP and has tried 2 medications for anxiety that she states made her weak and nauseous and did not feel well. She believes that her anxiety improved with going back to work and being more active. States that she did not feel well when she had to be at home for long period of time. She has also established with counselor.   Has not had any further chest pain. States that she is doing well. No longer taking Coreg as she does not feel that she needs it.    Past Medical History:  Diagnosis Date   Abnormal stress ECG 10/06/2018   Aortic valve sclerosis 10/06/2018   Arthritis    Bilateral carotid bruits 10/06/2018   Headache    H/O bad HA, since using CPAP- she has had improvement with that issue    Hypertension    Laboratory examination 10/06/2018   OSA on CPAP    settting - 8, uses CPAP q night     Past Surgical History:  Procedure Laterality Date   CHOLECYSTECTOMY  1998   TOTAL KNEE ARTHROPLASTY Right 09/19/2017   Procedure: RIGHT TOTAL KNEE ARTHROPLASTY;  Surgeon: Leandrew Koyanagi, MD;  Location: [facility];  Service: Orthopedics;  Laterality: Right;   TUBAL LIGATION      Family History  Problem Relation Age of Onset   Breast cancer Sister     Social History   Socioeconomic  History   Marital status: Widowed    Spouse name: Not on file   Number of children: 3   Years of education: Not on file   Highest education level: Not on file  Occupational History   Not on file  Social Needs   Financial resource strain: Not on file   Food insecurity:    Worry: Not on file    Inability: Not on file   Transportation needs:    Medical: Not on file    Non-medical: Not on file  Tobacco Use   Smoking status: Never Smoker   Smokeless tobacco: Never Used  Substance and Sexual Activity   Alcohol use: Yes    Comment: sometimes wine   Drug use:  No   Sexual activity: Not Currently  Lifestyle   Physical activity:    Days per week: Not on file    Minutes per session: Not on file   Stress: Not on file  Relationships   Social connections:    Talks on phone: Not on file    Gets together: Not on file    Attends religious service: Not on file    Active member of club or organization: Not on file    Attends meetings of clubs or organizations: Not on file    Relationship status: Not on file   Intimate partner violence:    Fear of current or ex partner: Not on file    Emotionally abused: Not on file    Physically abused: Not on file    Forced sexual activity: Not on file  Other Topics Concern   Not on file  Social History Narrative   Not on file    Current Meds  Medication Sig   aspirin EC 81 MG tablet Take 1 tablet (81 mg total) by mouth daily.   azelastine (ASTELIN) 0.1 % nasal spray Place 2 sprays into both nostrils 2 (two) times daily.   budesonide-formoterol (SYMBICORT) 80-4.5 MCG/ACT inhaler INL 2 PFS ITL BID   chlorthalidone (HYGROTON) 25 MG tablet Take 1 tablet (25 mg total) by mouth daily.   Cholecalciferol (VITAMIN D3) 25 MCG (1000 UT) CAPS Take by mouth.   fluticasone (FLONASE) 50 MCG/ACT nasal spray Place 2 sprays into both nostrils daily.   latanoprost (XALATAN) 0.005 % ophthalmic solution INT 1 GTT IN OU QD IN THE EVE   losartan (COZAAR) 100 MG tablet Take 1 tablet (100 mg total) by mouth daily.   Melatonin 5 MG CHEW Chew 5 mg by mouth daily for 30 days.   Multiple Vitamins-Minerals (WOMENS 50+ MULTI VITAMIN/MIN PO) Take by mouth daily.   nitroGLYCERIN (NITROSTAT) 0.4 MG SL tablet Place 1 tablet (0.4 mg total) under the tongue every 5 (five) minutes as needed for chest pain.   potassium chloride SA (K-DUR,KLOR-CON) 20 MEQ tablet TK 1 T PO QD   triamcinolone cream (KENALOG) 0.1 % APPLY TO THE AFFECTED AREA TWICE DAILY     Review of Systems  Constitution: Negative for decreased appetite,  malaise/fatigue, weight gain and weight loss.  Eyes: Negative for visual disturbance.  Cardiovascular: Positive for chest pain and palpitations. Negative for claudication, dyspnea on exertion (also at rest), leg swelling, orthopnea and syncope.  Respiratory: Negative for hemoptysis and wheezing.   Endocrine: Negative for cold intolerance and heat intolerance.  Hematologic/Lymphatic: Does not bruise/bleed easily.  Skin: Negative for nail changes.  Musculoskeletal: Positive for joint pain (R>L shoulder). Negative for muscle weakness and myalgias.       Shoulder: Assessment  of pain reveals the following findings: - Causative Factors include - overhead activities and placing hand behind head.  Gastrointestinal: Negative for abdominal pain, change in bowel habit, nausea and vomiting.       Belching and Indigestion.  Neurological: Negative for difficulty with concentration, dizziness, focal weakness and headaches.  Psychiatric/Behavioral: Negative for altered mental status and suicidal ideas.  All other systems reviewed and are negative.      Objective:     Blood pressure 120/66, height 5' 6"  (1.676 m), weight 180 lb (81.6 kg).  Cardiac studies:  Event monitor 2 weeks placed 10/06/2018: No symptoms reported.  Normal sinus rhythm.  Minimum heart rate 54 bpm at 12:17 PM and maximum heart rate 97 bpm. no other arrhythmias. Monitor worn for 3 hours.  EKG 06/04/2018: Normal sinus rhythm at 67 bpm, normal axis, normal interval, no evidence of ischemia. Normal EKG. No change from EKG 12/21/2016.  Echocardiogram 01/17/2017: Left ventricle cavity is normal in size. Moderate concentric hypertrophy of the left ventricle. Normal global wall motion. Doppler evidence of grade II (pseudonormal) diastolic dysfunction. Diastolic dysfunction findings suggests elevated LA/LV end diastolic pressure. Calculated EF 68%. Trace mitral regurgitation. Trace tricuspid regurgitation. Unable to estimate PA pressure due  to absence/minimal TR signal.  Carotid artery duplex 01/09/2017: There is mild soft plaque in bilateral carotid arteries without stenosis.  Antegrade right vertebral artery flow. Antegrade left vertebral artery flow.  Lexiscan myoview stress test 12/31/2016: 1. The resting electrocardiogram demonstrated normal sinus rhythm, normal resting conduction, no resting arrhythmias and normal rest repolarization. Stress EKG is non-diagnostic for ischemia as it a pharmacologic stress using Lexiscan. Stress symptoms included dyspnea. 2. Myocardial perfusion imaging is normal. Overall left ventricular systolic function was normal without regional wall motion abnormalities. The left ventricular ejection fraction was 75%.  Treadmill stress test 12/28/2016:  Indication: SoB, Aortic Atherosclerosis Resting EKG demonstrates NSR. The patient exercised according to Modified Bruce Protocol, Total time recorded 4:00 min achieving max heart rate of 156 which was 103% of THR for age and 4.64 METS of work. Stress terminated due to marked dyspnea, fatigue and THR (>85% MPHR)/MPHR met. Hypertensive BP response. There was 2 mm ST depression which persisted for > 2 minutes into recovery. There were no significant arrhythmias. Rec: Modofied Bruce protocol. Abnormal treadmill stress text with 2 mm horizontal ST depression at peak persisting for > 2 minutes into recovery. Symptoms of dyspnea. Hypertensive BP response. with peak BP of 284/84 mm Hg. No significant arrhythmias. Recommend different modality of testing to evaluate for ischemia vs hypertensive heart response.   Sleep Study 09/2016: sleep apnea  Recent Labs:   CMP Latest Ref Rng & Units 05/12/2018 10/17/2017  Glucose 65 - 99 mg/dL 96 118(H)  BUN 8 - 27 mg/dL 19 16  Creatinine 0.57 - 1.00 mg/dL 0.97 0.99  Sodium 134 - 144 mmol/L 141 141  Potassium 3.5 - 5.2 mmol/L 3.3(L) 2.8(L)  Chloride 96 - 106 mmol/L 101 97  CO2 20 - 29 mmol/L 24 27  Calcium 8.7 - 10.3 mg/dL 9.4  9.6  Total Protein 6.0 - 8.5 g/dL 7.2 -  Total Bilirubin 0.0 - 1.2 mg/dL <0.2 -  Alkaline Phos 39 - 117 IU/L 88 -  AST 0 - 40 IU/L 18 -  ALT 0 - 32 IU/L 17 -   CBC Latest Ref Rng & Units 05/12/2018 10/17/2017  WBC 3.4 - 10.8 x10E3/uL 8.3 7.4  Hemoglobin 11.1 - 15.9 g/dL 10.8(L) 10.6(L)  Hematocrit 34.0 - 46.6 % 33.1(L)  33.4(L)  Platelets 150 - 450 x10E3/uL 220 271   Lipid Panel     Component Value Date/Time   CHOL 179 10/06/2018 1012   TRIG 76 10/06/2018 1012   HDL 50 10/06/2018 1012   CHOLHDL 3.6 10/06/2018 1012   LDLCALC 114 (H) 10/06/2018 1012   Lab Results  Component Value Date   TSH 0.980 10/23/2018   Physical exam not performed or limited due to virtual visit. Please see exam details from prior visit is as below.   Physical Exam  Constitutional: She is oriented to person, place, and time. She appears well-developed and well-nourished. No distress.  HENT:  Head: Normocephalic and atraumatic.  Eyes: Conjunctivae are normal.  Neck: Normal range of motion. Neck supple. No JVD present. No thyromegaly present.  Cardiovascular: Normal rate, regular rhythm, normal heart sounds and intact distal pulses. Exam reveals no gallop.  No murmur heard. Pulses:      Carotid pulses are on the right side with bruit and on the left side with bruit. SEM - Aortic Area  Pulmonary/Chest: Effort normal and breath sounds normal.  Abdominal: Soft. Bowel sounds are normal. She exhibits no distension.  Musculoskeletal: Normal range of motion.        General: No edema.  Neurological: She is alert and oriented to person, place, and time.  Skin: Skin is warm and dry.  Psychiatric: She has a normal mood and affect.  Vitals reviewed.          Assessment & Recommendations:   Palpitations  Essential hypertension  Anxiety  Atypical chest pain    Plan: Patient was unable to tolerate wearing a monitor due to skin sensitivity and was only worn for approximately 3 hours.  She did not  have any arrhythmias at that time.  Since last seen by me, palpitations and chest pain have resolved.  It seems as though her symptoms may have been related to anxiety/depression.  She has not tolerated medications given by her PCP, but symptoms have improved with being more active and returning back to work.  Encouraged her to continue with this and to continue to follow-up with ECP and counselor for management of this.  Pressure is well controlled with current medical therapy.  She has stopped Coreg as she does not feel that she needs this.  Her blood pressure is well controlled with current medications, can continue to hold off on resuming Coreg unless needed.  She does not feel that she felt well with the medication.  She has had reassuring cardiac work-up in the past, no further cardiac testing needed at this point.  She will continue with risk factor management by her PCP.  I will see her back on a as needed basis for any new or worsening problems.   Jeri Lager, MSN, APRN, FNP-C [facility], [place] Office: [phone] Fax: [phone]

## 2018-11-25 ENCOUNTER — Other Ambulatory Visit: Payer: Self-pay

## 2018-11-25 ENCOUNTER — Ambulatory Visit: Payer: Medicare Other | Attending: Primary Care | Admitting: Primary Care

## 2018-11-27 ENCOUNTER — Other Ambulatory Visit: Payer: Self-pay

## 2018-11-27 ENCOUNTER — Encounter (HOSPITAL_COMMUNITY): Payer: Self-pay | Admitting: Emergency Medicine

## 2018-11-27 ENCOUNTER — Telehealth: Payer: Self-pay | Admitting: Family Medicine

## 2018-11-27 ENCOUNTER — Ambulatory Visit (HOSPITAL_COMMUNITY)
Admission: EM | Admit: 2018-11-27 | Discharge: 2018-11-27 | Disposition: A | Discharge status: Home / Self Care | Payer: Medicare Other | Attending: Family Medicine | Admitting: Family Medicine

## 2018-11-27 DIAGNOSIS — I10 Essential (primary) hypertension: Secondary | ICD-10-CM

## 2018-11-27 DIAGNOSIS — J029 Acute pharyngitis, unspecified: Secondary | ICD-10-CM | POA: Diagnosis not present

## 2018-11-27 LAB — POCT RAPID STREP A: Streptococcus, Group A Screen (Direct): NEGATIVE

## 2018-11-27 MED ORDER — OMEPRAZOLE 20 MG PO CPDR: 20.0000 mg | DELAYED_RELEASE_CAPSULE | Freq: Every day | ORAL | 0 refills | Status: DC

## 2018-11-27 MED ORDER — LORATADINE 10 MG PO TABS: 10.0000 mg | ORAL_TABLET | Freq: Every day | ORAL | 0 refills | Status: DC

## 2018-11-27 NOTE — Discharge Instructions (Signed)
You may use over the counter ibuprofen or acetaminophen as needed.  For a sore throat, over the counter products such as [employer] Peroxyl Mouth Sore Rinse or Chloraseptic Sore Throat Spray may provide some temporary relief. Your rapid strep test was negative today. We have sent your throat swab for culture and will let you know of any positive results. 

## 2018-11-27 NOTE — Telephone Encounter (Signed)
Called pt and lm on vm to call [facility] to sched 6 mo f/u w Debbora Presto, NP around 04/30/19

## 2018-11-27 NOTE — ED Provider Notes (Signed)
[place]   [id number] 11/27/18 Arrival Time: 2956  ASSESSMENT & PLAN:  1. Sore throat   2. Essential hypertension    Rapid strep: negative. Culture sent. No signs of peritonsillar abscess. Likely seasonal allergy and/or GERD related. Discussed.  Trial of: Meds ordered this encounter  Medications  . loratadine (CLARITIN) 10 MG tablet    Sig: Take 1 tablet (10 mg total) by mouth daily.    Dispense:  30 tablet    Refill:  0  . omeprazole (PRILOSEC) 20 MG capsule    Sig: Take 1 capsule (20 mg total) by mouth daily.    Dispense:  30 capsule    Refill:  0   Monitor BP. Discussed.   Discharge Instructions      You may use over the counter ibuprofen or acetaminophen as needed.   For a sore throat, over the counter products such as [employer] Peroxyl Mouth Sore Rinse or Chloraseptic Sore Throat Spray may provide some temporary relief.  Your rapid strep test was negative today. We have sent your throat swab for culture and will let you know of any positive results.    Reviewed expectations re: course of current medical issues. Questions answered. Outlined signs and symptoms indicating need for more acute intervention. Patient verbalized understanding. After Visit Summary given.   SUBJECTIVE:  Rachel Crane is a 73 y.o. female who reports a sore throat. Describes as "just feeling sore"; worse with swallowing. Onset gradual beginning over this past week. No respiratory symptoms. Normal PO intake but reports discomfort with swallowing. Questions "feeling a burning sometimes further down my throat"; sporadic; unsure if worse after eating. No frank heartburn or significant belching. Normal appetite. Fever reported: no. No neck pain or swelling. No associated n/v/abdominal symptoms. Sick contacts: none reported.  OTC treatment: allergy nose spray without much relief.  Slightly increased blood pressure noted today. Reports that she has not taken her medication today.   ROS: As per HPI. All other systems negative.    OBJECTIVE:  Vitals:   11/27/18 1150  BP: (!) 146/74  Pulse: 66  Resp: 16  Temp: 97.9 F (36.6 C)  SpO2: 97%    General appearance: alert; no distress HEENT: throat with mild erythema and cobblestoning; uvula midline: yes Neck: supple with FROM; no lymphadenopathy CV: RRR Lungs: clear to auscultation bilaterally Abd: soft; non-tender Skin: reveals no rash; warm and dry Psychological: alert and cooperative; normal mood and affect  No Known Allergies  Past Medical History:  Diagnosis Date  . Abnormal stress ECG 10/06/2018  . Aortic valve sclerosis 10/06/2018  . Arthritis   . Bilateral carotid bruits 10/06/2018  . Headache    H/O bad HA, since using CPAP- she has had improvement with that issue   . Hypertension   . Laboratory examination 10/06/2018  . OSA on CPAP    settting - 8, uses CPAP q night    Social History   Socioeconomic History  . Marital status: Widowed    Spouse name: Not on file  . Number of children: 3  . Years of education: Not on file  . Highest education level: Not on file  Occupational History  . Not on file  Social Needs  . Financial resource strain: Not on file  . Food insecurity:    Worry: Not on file    Inability: Not on file  . Transportation needs:    Medical: Not on file    Non-medical: Not on file  Tobacco Use  .  Smoking status: Never Smoker  . Smokeless tobacco: Never Used  Substance and Sexual Activity  . Alcohol use: Yes    Comment: sometimes wine  . Drug use: No  . Sexual activity: Not Currently  Lifestyle  . Physical activity:    Days per week: Not on file    Minutes per session: Not on file  . Stress: Not on file  Relationships  . Social connections:    Talks on phone: Not on file    Gets together: Not on file    Attends religious service: Not on file    Active member of club or organization: Not on file    Attends meetings of clubs or organizations: Not on file     Relationship status: Not on file  . Intimate partner violence:    Fear of current or ex partner: Not on file    Emotionally abused: Not on file    Physically abused: Not on file    Forced sexual activity: Not on file  Other Topics Concern  . Not on file  Social History Narrative  . Not on file   Family History  Problem Relation Age of Onset  . Breast cancer Sister           Vanessa Kick, MD 12/08/18 [phone]

## 2018-11-27 NOTE — ED Triage Notes (Signed)
Pt c/o sore throat, states shes been garggling with salt water, symptoms x5 days.

## 2018-11-29 LAB — CULTURE, GROUP A STREP (THRC)

## 2018-12-04 ENCOUNTER — Encounter: Payer: Self-pay | Admitting: Primary Care

## 2018-12-04 ENCOUNTER — Ambulatory Visit: Payer: Medicare Other | Attending: Primary Care | Admitting: Primary Care

## 2018-12-04 ENCOUNTER — Other Ambulatory Visit: Payer: Self-pay

## 2018-12-04 VITALS — BP 159/75 | HR 71 | Temp 98.9°F | Ht 66.0 in | Wt 205.8 lb

## 2018-12-04 DIAGNOSIS — J302 Other seasonal allergic rhinitis: Secondary | ICD-10-CM

## 2018-12-04 DIAGNOSIS — I10 Essential (primary) hypertension: Secondary | ICD-10-CM | POA: Diagnosis not present

## 2018-12-04 DIAGNOSIS — F320 Major depressive disorder, single episode, mild: Secondary | ICD-10-CM

## 2018-12-04 DIAGNOSIS — J029 Acute pharyngitis, unspecified: Secondary | ICD-10-CM

## 2018-12-04 MED ORDER — MAGIC MOUTHWASH W/LIDOCAINE: 1.0000 mL | Freq: Three times a day (TID) | ORAL | Status: DC | PRN

## 2018-12-04 MED ORDER — GUAIFENESIN ER 600 MG PO TB12: 600.0000 mg | ORAL_TABLET | Freq: Two times a day (BID) | ORAL | 0 refills | Status: DC

## 2018-12-04 MED ORDER — LOSARTAN POTASSIUM 100 MG PO TABS: 100.0000 mg | ORAL_TABLET | Freq: Every day | ORAL | 3 refills | Status: DC

## 2018-12-04 MED ORDER — OMEPRAZOLE 20 MG PO CPDR: 20.0000 mg | DELAYED_RELEASE_CAPSULE | Freq: Every day | ORAL | 1 refills | Status: DC

## 2018-12-04 NOTE — Progress Notes (Signed)
Acute Office Visit  Subjective:    Patient ID: Rachel Crane, female    DOB: Nov 18, 1945, 73 y.o.   MRN: [id number]  Chief Complaint  Patient presents with  . Sore Throat    HPI Mr. Beddow present today with still complaining of sore throat and was seen in ED on 11/27/2018 at that time she was tested for strep which was negative, tx for GERD with omeprazole 20 mg and loratadine 10 mg.She has a  past medical history of hypertension, prediabetes, sleep apnea currently on CPAP, remote history of aortic valve sclerosis by previous echo and increase anxiety with depression.  Past Medical History:  Diagnosis Date  . Abnormal stress ECG 10/06/2018  . Aortic valve sclerosis 10/06/2018  . Arthritis   . Bilateral carotid bruits 10/06/2018  . Headache    H/O bad HA, since using CPAP- she has had improvement with that issue   . Hypertension   . Laboratory examination 10/06/2018  . OSA on CPAP    settting - 8, uses CPAP q night     Past Surgical History:  Procedure Laterality Date  . CHOLECYSTECTOMY  1998  . TOTAL KNEE ARTHROPLASTY Right 09/19/2017   Procedure: RIGHT TOTAL KNEE ARTHROPLASTY;  Surgeon: Leandrew Koyanagi, MD;  Location: [facility];  Service: Orthopedics;  Laterality: Right;  . TUBAL LIGATION      Family History  Problem Relation Age of Onset  . Breast cancer Sister     Social History   Socioeconomic History  . Marital status: Widowed    Spouse name: Not on file  . Number of children: 3  . Years of education: Not on file  . Highest education level: Not on file  Occupational History  . Not on file  Social Needs  . Financial resource strain: Not on file  . Food insecurity:    Worry: Not on file    Inability: Not on file  . Transportation needs:    Medical: Not on file    Non-medical: Not on file  Tobacco Use  . Smoking status: Never Smoker  . Smokeless tobacco: Never Used  Substance and Sexual Activity  . Alcohol use: Yes    Comment: sometimes wine  . Drug use: No  .  Sexual activity: Not Currently  Lifestyle  . Physical activity:    Days per week: Not on file    Minutes per session: Not on file  . Stress: Not on file  Relationships  . Social connections:    Talks on phone: Not on file    Gets together: Not on file    Attends religious service: Not on file    Active member of club or organization: Not on file    Attends meetings of clubs or organizations: Not on file    Relationship status: Not on file  . Intimate partner violence:    Fear of current or ex partner: Not on file    Emotionally abused: Not on file    Physically abused: Not on file    Forced sexual activity: Not on file  Other Topics Concern  . Not on file  Social History Narrative  . Not on file    Outpatient Medications Prior to Visit  Medication Sig Dispense Refill  . aspirin EC 81 MG tablet Take 1 tablet (81 mg total) by mouth daily. 90 tablet 3  . azelastine (ASTELIN) 0.1 % nasal spray Place 2 sprays into both nostrils 2 (two) times daily. 30 mL 5  .  budesonide-formoterol (SYMBICORT) 80-4.5 MCG/ACT inhaler INL 2 PFS ITL BID    . chlorthalidone (HYGROTON) 25 MG tablet Take 1 tablet (25 mg total) by mouth daily. 90 tablet 1  . Cholecalciferol (VITAMIN D3) 25 MCG (1000 UT) CAPS Take by mouth.    . fluticasone (FLONASE) 50 MCG/ACT nasal spray Place 2 sprays into both nostrils daily. 1 g 5  . latanoprost (XALATAN) 0.005 % ophthalmic solution INT 1 GTT IN OU QD IN THE EVE  10  . loratadine (CLARITIN) 10 MG tablet Take 1 tablet (10 mg total) by mouth daily. 30 tablet 0  . Multiple Vitamins-Minerals (WOMENS 50+ MULTI VITAMIN/MIN PO) Take by mouth daily.    . nitroGLYCERIN (NITROSTAT) 0.4 MG SL tablet Place 1 tablet (0.4 mg total) under the tongue every 5 (five) minutes as needed for chest pain. 50 tablet 3  . potassium chloride SA (K-DUR,KLOR-CON) 20 MEQ tablet TK 1 T PO QD    . triamcinolone cream (KENALOG) 0.1 % APPLY TO THE AFFECTED AREA TWICE DAILY 454 g 0  . losartan (COZAAR)  100 MG tablet Take 1 tablet (100 mg total) by mouth daily. 30 tablet 3  . omeprazole (PRILOSEC) 20 MG capsule Take 1 capsule (20 mg total) by mouth daily. 30 capsule 0   No facility-administered medications prior to visit.     No Known Allergies  Review of Systems  Constitutional: Positive for chills and fever.  HENT: Negative.   Eyes: Negative.   Respiratory: Negative.   Cardiovascular: Negative.   Gastrointestinal: Negative.   Genitourinary: Negative.   Musculoskeletal: Negative.   Skin: Negative.   Neurological: Negative.   Endo/Heme/Allergies: Negative.   Psychiatric/Behavioral: Positive for depression.       Objective:    Physical Exam  Constitutional: She appears well-developed and well-nourished.  HENT:  Head: Normocephalic and atraumatic.  Neck: Normal range of motion. Neck supple.  Cardiovascular: Normal rate and regular rhythm.  Pulmonary/Chest: Effort normal and breath sounds normal.    BP (!) 159/75   Pulse 71   Temp 98.9 F (37.2 C) (Oral)   Ht 5\' 6"  (1.676 m)   Wt 205 lb 12.8 oz (93.4 kg)   SpO2 98%   BMI 33.22 kg/m  Wt Readings from Last 3 Encounters:  12/04/18 205 lb 12.8 oz (93.4 kg)  11/21/18 180 lb (81.6 kg)  10/10/18 200 lb 12.8 oz (91.1 kg)    There are no preventive care reminders to display for this patient.  There are no preventive care reminders to display for this patient.   Lab Results  Component Value Date   TSH 0.980 10/23/2018   Lab Results  Component Value Date   WBC 6.4 10/06/2018   HGB 11.2 10/06/2018   HCT 33.8 (L) 10/06/2018   MCV 81 10/06/2018   PLT 220 05/12/2018   Lab Results  Component Value Date   NA 144 11/10/2018   K 3.6 11/10/2018   CO2 26 11/10/2018   GLUCOSE 98 11/10/2018   BUN 15 11/10/2018   CREATININE 1.07 (H) 11/10/2018   BILITOT 0.5 11/10/2018   ALKPHOS 78 11/10/2018   AST 18 11/10/2018   ALT 18 11/10/2018   PROT 7.2 11/10/2018   ALBUMIN 4.6 11/10/2018   CALCIUM 9.7 11/10/2018    ANIONGAP 10 09/20/2017   Lab Results  Component Value Date   CHOL 179 10/06/2018   Lab Results  Component Value Date   HDL 50 10/06/2018   Lab Results  Component Value Date   LDLCALC  114 (H) 10/06/2018   Lab Results  Component Value Date   TRIG 76 10/06/2018   Lab Results  Component Value Date   CHOLHDL 3.6 10/06/2018   No results found for: HGBA1C     Assessment & Plan:   Problem List Items Addressed This Visit    None    Visit Diagnoses    Sore throat    -  Primary   Relevant Medications   magic mouthwash w/lidocaine     Bobbie was seen today for sore throat.  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 She does need a lot of reassurance.   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  Other orders -     guaiFENesin (MUCINEX) 600 MG 12 hr tablet; Take 1 tablet (600 mg total) by mouth 2 (two) times daily. -     omeprazole (PRILOSEC) 20 MG capsule; Take 1 capsule (20 mg total) by mouth daily.    Other orders -     guaiFENesin (MUCINEX) 600 MG 12 hr tablet; Take 1 tablet (600 mg total) by mouth 2 (two) times daily. -     omeprazole (PRILOSEC) 20 MG capsule; Take 1 capsule (20 mg total) by mouth daily.    Meds ordered this encounter  Medications  . guaiFENesin (MUCINEX) 600 MG 12 hr tablet    Sig: Take 1 tablet (600 mg total) by mouth 2 (two) times daily.    Dispense:  20 tablet    Refill:  0  . omeprazole (PRILOSEC) 20 MG capsule     Sig: Take 1 capsule (20 mg total) by mouth daily.    Dispense:  30 capsule    Refill:  1  . magic mouthwash w/lidocaine  . losartan (COZAAR) 100 MG tablet    Sig: Take 1 tablet (100 mg total) by mouth daily.    Dispense:  30 tablet    Refill:  3     [name], NP

## 2018-12-09 DIAGNOSIS — H2513 Age-related nuclear cataract, bilateral: Secondary | ICD-10-CM | POA: Diagnosis not present

## 2018-12-09 DIAGNOSIS — H401131 Primary open-angle glaucoma, bilateral, mild stage: Secondary | ICD-10-CM | POA: Diagnosis not present

## 2018-12-09 DIAGNOSIS — H04123 Dry eye syndrome of bilateral lacrimal glands: Secondary | ICD-10-CM | POA: Diagnosis not present

## 2018-12-15 ENCOUNTER — Ambulatory Visit: Payer: Medicare Other | Admitting: Adult Health

## 2019-01-14 ENCOUNTER — Ambulatory Visit: Payer: Medicare Other | Admitting: Allergy

## 2019-01-15 ENCOUNTER — Encounter: Payer: Self-pay | Admitting: Allergy

## 2019-01-15 ENCOUNTER — Telehealth: Payer: Self-pay | Admitting: Orthopaedic Surgery

## 2019-01-15 ENCOUNTER — Other Ambulatory Visit: Payer: Self-pay

## 2019-01-15 ENCOUNTER — Ambulatory Visit (INDEPENDENT_AMBULATORY_CARE_PROVIDER_SITE_OTHER): Payer: Medicare Other | Admitting: Allergy

## 2019-01-15 VITALS — BP 136/62 | HR 74 | Temp 98.0°F

## 2019-01-15 DIAGNOSIS — J4540 Moderate persistent asthma, uncomplicated: Secondary | ICD-10-CM | POA: Diagnosis not present

## 2019-01-15 DIAGNOSIS — H1010 Acute atopic conjunctivitis, unspecified eye: Secondary | ICD-10-CM | POA: Diagnosis not present

## 2019-01-15 DIAGNOSIS — J309 Allergic rhinitis, unspecified: Secondary | ICD-10-CM | POA: Diagnosis not present

## 2019-01-15 DIAGNOSIS — L299 Pruritus, unspecified: Secondary | ICD-10-CM

## 2019-01-15 DIAGNOSIS — K219 Gastro-esophageal reflux disease without esophagitis: Secondary | ICD-10-CM | POA: Diagnosis not present

## 2019-01-15 MED ORDER — BUDESONIDE-FORMOTEROL FUMARATE 80-4.5 MCG/ACT IN AERO: INHALATION_SPRAY | RESPIRATORY_TRACT | 5 refills | Status: DC

## 2019-01-15 MED ORDER — TRIAMCINOLONE ACETONIDE 0.1 % EX CREA: TOPICAL_CREAM | Freq: Two times a day (BID) | CUTANEOUS | 5 refills | Status: DC

## 2019-01-15 MED ORDER — OMEPRAZOLE 20 MG PO CPDR: 20.0000 mg | DELAYED_RELEASE_CAPSULE | Freq: Every day | ORAL | 5 refills | Status: DC

## 2019-01-15 MED ORDER — LORATADINE 10 MG PO TABS: 10.0000 mg | ORAL_TABLET | Freq: Every day | ORAL | 5 refills | Status: DC

## 2019-01-15 NOTE — Patient Instructions (Addendum)
Allergic Rhinitis: - Continue Flonase 2 sprays each nostril once daily for sinus congestion - Continue Azelastine 0.1% 2 sprays each nostril twice a day for runny nose/post-nasal drip/nasal itch. - Can use both Flonase and Azelastine together if needed - Continue Claritin 10mg  daily.   Asthma - ProAir 2 puffs every 4 hours as needed for cough or wheeze - Use Symbicort 80- 2 puffs twice a day everyday no matter if you feel well or sick  Control goals:   Full participation in all desired activities (may need albuterol before activity)  Albuterol use two time or less a week on average (not counting use with activity)  Cough interfering with sleep two time or less a month  Oral steroids no more than once a year  No hospitalizations  Pruritis - Continue daily moisturizer - Continue either Loratidine or Allegra daily - Continue Triamcinolone 0.1% cream on red itchy areas below your face twice a day as needed.  Reflux  -Continue Prilosec daily  Continue the other medications as listed in the chart.  Follow up in 3-4 months or sooner if needed

## 2019-01-15 NOTE — Progress Notes (Signed)
Follow-up Note  RE: Rachel Crane MRN: [id number] DOB: [birth year] Date of O63ffice Visit: 01/15/2019   History of present illness: Rachel Crane is a 73 y.o. female presenting today for follow-up of allergic rhinitis, asthma and pruritus.  She was last seen in the office on 10/10/18 by our NP Gareth Morgan.    She went to the ED for a sore throat on 11/27/18 and she was prescribed Claritin.  She does feel the Claritin has been helpful and she feels it helps with her drainage.  She has run out of her Claritin and would like a refill.   She was also recommended to take prilosec by ED which she does feel helps control her reflux symptoms.  She needs a refill of that as well.   After her last visit with [place] it was recommended she take Allegra and she states that helped as well.  She feels the Claritin and Allegra are equally effective.    Triamcinolone has helped with the rash on her legs and arms.  She states she only has a little bit left and would like a refill as well.   Her biggest concern today is that she has been having more chest tightness and SOB this summer.  She has symbicort 5mcg inhaler but has not been taking it.  She states she tried to use the symbicort but nothing came out.  She called the pharmacy who she states advised her to call the manufacturer.  She states now her Symbicort is working but she still has not started to use it consistently.  She states that strong smells can be a big trigger for her asthma.      Review of systems: Review of Systems  Constitutional: Negative for chills, fever and malaise/fatigue.  HENT: Negative for congestion, ear discharge, nosebleeds and sore throat.   Eyes: Negative for pain and redness.  Respiratory: Positive for shortness of breath. Negative for cough and wheezing.   Cardiovascular: Negative for chest pain.  Gastrointestinal: Negative for abdominal pain, constipation, diarrhea, heartburn, nausea and vomiting.  Musculoskeletal: Negative for  joint pain.  Skin: Negative for itching and rash.  Neurological: Negative for headaches.    All other systems negative unless noted above in HPI  Past medical/social/surgical/family history have been reviewed and are unchanged unless specifically indicated below.  No changes  Medication List: Allergies as of 01/15/2019   No Known Allergies     Medication List       Accurate as of January 15, 2019  6:23 PM. If you have any questions, ask your nurse or doctor.        aspirin EC 81 MG tablet Take 1 tablet (81 mg total) by mouth daily.   azelastine 0.1 % nasal spray Commonly known as: ASTELIN Place 2 sprays into both nostrils 2 (two) times daily.   budesonide-formoterol 80-4.5 MCG/ACT inhaler Commonly known as: SYMBICORT 2 Puffs 2 times daily What changed: See the new instructions. Changed by: Leib Elahi Charmian Muff, MD   chlorthalidone 25 MG tablet Commonly known as: HYGROTON Take 1 tablet (25 mg total) by mouth daily.   fluticasone 50 MCG/ACT nasal spray Commonly known as: Flonase Place 2 sprays into both nostrils daily.   guaiFENesin 600 MG 12 hr tablet Commonly known as: Mucinex Take 1 tablet (600 mg total) by mouth 2 (two) times daily.   latanoprost 0.005 % ophthalmic solution Commonly known as: XALATAN INT 1 GTT IN OU QD IN THE EVE   loratadine 10 MG  tablet Commonly known as: CLARITIN Take 1 tablet (10 mg total) by mouth daily.   losartan 100 MG tablet Commonly known as: COZAAR Take 1 tablet (100 mg total) by mouth daily.   nitroGLYCERIN 0.4 MG SL tablet Commonly known as: NITROSTAT Place 1 tablet (0.4 mg total) under the tongue every 5 (five) minutes as needed for chest pain.   omeprazole 20 MG capsule Commonly known as: PRILOSEC Take 1 capsule (20 mg total) by mouth daily.   potassium chloride SA 20 MEQ tablet Commonly known as: K-DUR TK 1 T PO QD   triamcinolone cream 0.1 % Commonly known as: KENALOG Apply topically 2 (two) times daily. to  affected area   Vitamin D3 25 MCG (1000 UT) Caps Take by mouth.   WOMENS 50+ MULTI VITAMIN/MIN PO Take by mouth daily.       Known medication allergies: No Known Allergies   Physical examination: Blood pressure 136/62, pulse 74, temperature 98 F (36.7 C), temperature source Temporal, SpO2 96 %.  General: Alert, interactive, in no acute distress. HEENT: PERRLA, TMs pearly gray, turbinates minimally edematous without discharge, post-pharynx non erythematous. Neck: Supple without lymphadenopathy. Lungs: Clear to auscultation without wheezing, rhonchi or rales. {no increased work of breathing. CV: Normal S1, S2 without murmurs. Abdomen: Nondistended, nontender. Skin: Warm and dry, without lesions or rashes. Extremities:  No clubbing, cyanosis or edema. Neuro:   Grossly intact.  Diagnositics/Labs:  Spirometry: FEV1: 1.39L 70%, FVC: 2.63L 102%, ratio consistent with Obstructive pattern  Assessment and plan:   Allergic Rhinitis: - Continue Flonase 2 sprays each nostril once daily for sinus congestion - Continue Azelastine 0.1% 2 sprays each nostril twice a day for runny nose/post-nasal drip/nasal itch. - Can use both Flonase and Azelastine together if needed - Continue Claritin 10mg  daily.   Asthma - ProAir 2 puffs every 4 hours as needed for cough or wheeze - Use Symbicort 80- 2 puffs twice a day everyday no matter if you feel well or sick  Control goals:   Full participation in all desired activities (may need albuterol before activity)  Albuterol use two time or less a week on average (not counting use with activity)  Cough interfering with sleep two time or less a month  Oral steroids no more than once a year  No hospitalizations  Pruritis - Continue daily moisturizer - Continue either Loratidine or Allegra daily - Continue Triamcinolone 0.1% cream on red itchy areas below your face twice a day as needed.  Reflux  -Continue Prilosec daily  Continue the  other medications as listed in the chart.  Follow up in 3-4 months or sooner if needed   I appreciate the opportunity to take part in Atalia's care. Please do not hesitate to contact me with questions.  Sincerely,   Prudy Feeler, MD Allergy/Immunology [facility]

## 2019-01-15 NOTE — Telephone Encounter (Signed)
Is it for her hand or knee?  Looks like knee is all we have ever seen her for within past year

## 2019-01-15 NOTE — Telephone Encounter (Signed)
Do you know anything about this? Patient has not been seen since March.

## 2019-01-15 NOTE — Telephone Encounter (Signed)
Rachel Crane with [facility] called in said pt called them to make an appt said dr.xu was going to be sending a referral over to them but they have not received anything and are requesting it be send over again.   Fax: [phone] Phone:[phone] Pt Phone #: [phone]

## 2019-01-15 NOTE — Telephone Encounter (Signed)
[facility] facility They stated that patient was wanting PT for her feet and shoulder Advised per Mendel Ryder only seen for knee recently

## 2019-01-22 ENCOUNTER — Encounter (INDEPENDENT_AMBULATORY_CARE_PROVIDER_SITE_OTHER): Payer: Self-pay | Admitting: Primary Care

## 2019-01-22 ENCOUNTER — Encounter (INDEPENDENT_AMBULATORY_CARE_PROVIDER_SITE_OTHER): Payer: Medicare Other | Admitting: Primary Care

## 2019-01-22 ENCOUNTER — Other Ambulatory Visit: Payer: Self-pay

## 2019-01-22 DIAGNOSIS — M545 Low back pain, unspecified: Secondary | ICD-10-CM

## 2019-01-22 DIAGNOSIS — F411 Generalized anxiety disorder: Secondary | ICD-10-CM

## 2019-01-22 DIAGNOSIS — I10 Essential (primary) hypertension: Secondary | ICD-10-CM

## 2019-01-22 DIAGNOSIS — G8929 Other chronic pain: Secondary | ICD-10-CM

## 2019-01-22 NOTE — Progress Notes (Signed)
Pt complains of back pain shoulder pain and pain on the left side

## 2019-01-22 NOTE — Progress Notes (Signed)
Virtual Visit via Telephone Note  I connected with Rachel Crane on 01/22/19 at  3:30 PM EDT by telephone and verified that I am speaking with the correct person using two identifiers.   I discussed the limitations, risks, security and privacy concerns of performing an evaluation and management service by telephone and the availability of in person appointments. I also discussed with the patient that there may be a patient responsible charge related to this service. The patient expressed understanding and agreed to proceed.   History of Present Illness:   [name] is presenting today with complaints of  back pain shoulder pain and pain on the left side. She has a orthopedist Dr. Erlinda Hong I will refer this problem to ortho.   Observations/Objective: Review of Systems  Constitutional: Negative.   Musculoskeletal: Positive for back pain.       And shoulder   Psychiatric/Behavioral: The patient is nervous/anxious.   All other systems reviewed and are negative.   Assessment and Plan: Rachel Crane was seen today for gastroesophageal reflux and referral.  Diagnoses and all orders for this visit:  Low back pain without sciatica, unspecified back pain laterality, unspecified chronicity Increased pain is probably due to prolonged standing and walking she does have arthritis.  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  This requires repetitive walking and bending.  Will defer this problem to Dr. Erlinda Hong  Chronic left shoulder pain This continues to remain a chronic problem as stated before she has returned back to work and increased pain can be associated with lifting.Will defer this problem to Dr. Erlinda Hong  Essential hypertension She endorses take medication daily of chlorthalidone losartan.  Recommended blood pressure to be less than 130/80.-Diet to be reduced and sodium intake.  Patient is unable to take pressure at home.  Previous visit blood pressure remains high.  Counseling given then and  now on taking medication as prescribed.  Generalized anxiety disorder This has increased with the pandemic being laid off from work due to her age now returning back to work has seemed to improve her depression and anxiety.  On follow-up visit will encourage her to see clinical social worker.   Follow Up Instructions:    I discussed the assessment and treatment plan with the patient. The patient was provided an opportunity to ask questions and all were answered. The patient agreed with the plan and demonstrated an understanding of the instructions.   The patient was advised to call back or seek an in-person evaluation if the symptoms worsen or if the condition fails to improve as anticipated.  I provided 18 minutes of non-face-to-face time during this encounter.   [name], NP

## 2019-01-23 ENCOUNTER — Telehealth: Payer: Self-pay

## 2019-01-23 NOTE — Telephone Encounter (Signed)
ok 

## 2019-01-23 NOTE — Telephone Encounter (Signed)
Patient would like to have a referral for PT for Bilateral knees.  Stated that her knees are weak and that she has been using ice.  Cb# is [phone].  Please advise.  Thank you.

## 2019-01-23 NOTE — Telephone Encounter (Signed)
yes

## 2019-01-26 ENCOUNTER — Other Ambulatory Visit: Payer: Self-pay

## 2019-01-26 DIAGNOSIS — G8929 Other chronic pain: Secondary | ICD-10-CM

## 2019-01-26 NOTE — Telephone Encounter (Signed)
Sent order to PT

## 2019-01-27 ENCOUNTER — Ambulatory Visit (INDEPENDENT_AMBULATORY_CARE_PROVIDER_SITE_OTHER): Payer: Medicare Other | Admitting: Orthopaedic Surgery

## 2019-01-27 ENCOUNTER — Other Ambulatory Visit: Payer: Self-pay

## 2019-01-27 ENCOUNTER — Encounter: Payer: Self-pay | Admitting: Orthopaedic Surgery

## 2019-01-27 ENCOUNTER — Ambulatory Visit (INDEPENDENT_AMBULATORY_CARE_PROVIDER_SITE_OTHER): Payer: Medicare Other

## 2019-01-27 DIAGNOSIS — M545 Low back pain, unspecified: Secondary | ICD-10-CM

## 2019-01-27 DIAGNOSIS — Z96651 Presence of right artificial knee joint: Secondary | ICD-10-CM

## 2019-01-27 NOTE — Progress Notes (Signed)
Office Visit Note   Patient: Rachel Crane           Date of Birth: Oct 15, 1945           MRN: [id number] Visit Date: 01/27/2019              Requested by: [name], NP [address] [place],  [place] [address] PCP: [name], NP   Assessment & Plan: Visit Diagnoses:  1. Low back pain, unspecified back pain laterality, unspecified chronicity, unspecified whether sciatica present   2. S/P total knee arthroplasty, right     Plan: Impression is bilateral knee pain and chronic low back pain.  All these findings are attributed to her degenerative joint disease and deconditioning.  I think she needs to do a lot of outpatient physical therapy for strengthening.  I recommend over-the-counter Voltaren gel.  I have sent her to the outpatient physical therapy.  I would like to recheck her in about a year for her right knee.  Follow-Up Instructions: Return if symptoms worsen or fail to improve.   Orders:  Orders Placed This Encounter  Procedures  . XR Lumbar Spine 2-3 Views  . XR Knee 1-2 Views Right   No orders of the defined types were placed in this encounter.     Procedures: No procedures performed   Clinical Data: No additional findings.   Subjective: Chief Complaint  Patient presents with  . Right Knee - Pain  . Lower Back - Pain    Rachel Crane is a 73 year old female who is 16 months status post right total knee replacement.  She comes in for continued soreness and discomfort in both of her knees and her lumbar spine that is worse with prolonged standing and walking.  She was not able to get much physical therapy due to the COVID crisis.  She reports no constitutional symptoms.  She denies any radicular symptoms or any bowel or bladder dysfunction.   Review of Systems  Constitutional: Negative.   HENT: Negative.   Eyes: Negative.   Respiratory: Negative.   Cardiovascular: Negative.   Endocrine: Negative.   Musculoskeletal: Negative.   Neurological:  Negative.   Hematological: Negative.   Psychiatric/Behavioral: Negative.   All other systems reviewed and are negative.    Objective: Vital Signs: There were no vitals taken for this visit.  Physical Exam Vitals signs and nursing note reviewed.  Constitutional:      Appearance: She is well-developed.  Pulmonary:     Effort: Pulmonary effort is normal.  Skin:    General: Skin is warm.     Capillary Refill: Capillary refill takes less than 2 seconds.  Neurological:     Mental Status: She is alert and oriented to person, place, and time.  Psychiatric:        Behavior: Behavior normal.        Thought Content: Thought content normal.        Judgment: Judgment normal.     Ortho Exam Lumbar spine exam shows generalized tenderness to palpation.  No focal motor or sensory deficits of the lower extremities.  Bilateral knee exam shows 2+ patellofemoral crepitus.  Collaterals and cruciates are stable.  Mild quadriceps atrophy. Specialty Comments:  No specialty comments available.  Imaging: Xr Knee 1-2 Views Right  Result Date: 01/27/2019 Stable right total knee replacement in good alignment without complication.  Xr Lumbar Spine 2-3 Views  Result Date: 01/27/2019 Multilevel lumbar spondylosis and degenerative disc disease.  Mild anterolisthesis.  PMFS History: Patient Active Problem List   Diagnosis Date Noted  . Seasonal allergies 12/04/2018  . Allergic rhinoconjunctivitis 10/10/2018  . Moderate persistent asthma with acute exacerbation 10/10/2018  . Pruritus 10/10/2018  . Aortic valve sclerosis 10/06/2018  . Bilateral carotid bruits 10/06/2018  . Abnormal stress ECG 10/06/2018  . Laboratory examination 10/06/2018  . Palpitations 10/06/2018  . Total knee replacement status 09/19/2017  . Sprain of anterior talofibular ligament of right ankle 12/13/2016   Past Medical History:  Diagnosis Date  . Abnormal stress ECG 10/06/2018  . Aortic valve sclerosis 10/06/2018  .  Arthritis   . Asthma   . Bilateral carotid bruits 10/06/2018  . Headache    H/O bad HA, since using CPAP- she has had improvement with that issue   . Hypertension   . Laboratory examination 10/06/2018  . OSA on CPAP    settting - 8, uses CPAP q night     Family History  Problem Relation Age of Onset  . Breast cancer Sister     Past Surgical History:  Procedure Laterality Date  . CHOLECYSTECTOMY  1998  . TOTAL KNEE ARTHROPLASTY Right 09/19/2017   Procedure: RIGHT TOTAL KNEE ARTHROPLASTY;  Surgeon: Leandrew Koyanagi, MD;  Location: [facility];  Service: Orthopedics;  Laterality: Right;  . TUBAL LIGATION     Social History   Occupational History  . Not on file  Tobacco Use  . Smoking status: Never Smoker  . Smokeless tobacco: Never Used  Substance and Sexual Activity  . Alcohol use: Yes    Comment: sometimes wine  . Drug use: No  . Sexual activity: Not Currently

## 2019-01-29 ENCOUNTER — Other Ambulatory Visit: Payer: Self-pay | Admitting: Physician Assistant

## 2019-01-29 ENCOUNTER — Telehealth: Payer: Self-pay

## 2019-01-29 ENCOUNTER — Other Ambulatory Visit: Payer: Self-pay

## 2019-01-29 DIAGNOSIS — Z1231 Encounter for screening mammogram for malignant neoplasm of breast: Secondary | ICD-10-CM

## 2019-01-29 NOTE — Telephone Encounter (Signed)
Called patient advised as written per Dr Nelva Bush she verbalized understanding states she is unsure if she can follow plan and will go see someone else cause she is very itchy

## 2019-01-29 NOTE — Telephone Encounter (Signed)
Dr Padgett please advise 

## 2019-01-29 NOTE — Telephone Encounter (Signed)
She can try doubling the dose of her antihistamine (claritin or allegra) to help with itch.  Keep hands and feet moisturized at all times.  If hands and feet are dry and cracked it would be best if she can sleep in cotton gloves/socks and use a thick moisturizer like vaseline to seal in moisture.  Triamcinolone to be used sparingly for rash.

## 2019-01-29 NOTE — Telephone Encounter (Signed)
When she goes out in the sun her hands and feet are itching so bad when she applies the triamcinolone. She missed work today because she cant be outside.   [facility]  [address] and [address].

## 2019-02-02 ENCOUNTER — Ambulatory Visit (HOSPITAL_COMMUNITY)
Admission: EM | Admit: 2019-02-02 | Discharge: 2019-02-02 | Disposition: A | Discharge status: Home / Self Care | Payer: Medicare Other | Attending: Family Medicine | Admitting: Family Medicine

## 2019-02-02 ENCOUNTER — Other Ambulatory Visit: Payer: Self-pay

## 2019-02-02 ENCOUNTER — Encounter (HOSPITAL_COMMUNITY): Payer: Self-pay | Admitting: Emergency Medicine

## 2019-02-02 DIAGNOSIS — R21 Rash and other nonspecific skin eruption: Secondary | ICD-10-CM

## 2019-02-02 MED ORDER — CETIRIZINE HCL 1 MG/ML PO SOLN: 5.0000 mg | Freq: Every day | ORAL | 0 refills | Status: DC

## 2019-02-02 MED ORDER — VALACYCLOVIR HCL 1 G PO TABS: 1000.0000 mg | ORAL_TABLET | Freq: Three times a day (TID) | ORAL | 0 refills | Status: AC

## 2019-02-02 NOTE — Discharge Instructions (Addendum)
Read attached about shingles Begin valtrex 3 times a day for next 2 weeks May continue with aleeve for pain  Try daily cetirizine 5 mg for itching/allergies  Please follow up if pain or rash not resolving, or symptoms changing, rash spreading

## 2019-02-02 NOTE — ED Provider Notes (Signed)
[facility]    CSN: [id number] Arrival date & time: 02/02/19  1539      History   Chief Complaint Chief Complaint  Patient presents with  . Rash    HPI Will Schier is a 73 y.o. female history of allergic rhinitis, hypertension, OSA, presenting today for evaluation of a rash.  Patient states that over the past couple days she has developed a rash to her left side that is painful.  She feels pain with movement.  Describes it as a burning sensation.  Denies rash on right side.  She does note that a few weeks ago she developed rash and itching to bilateral lower extremities, but this is significantly improved with use of triamcinolone cream.  She notes when she works at [employer] she feels itchiness throughout her skin and believes it is related to allergies.  She denies associated fevers.  Denies associated URI symptoms.  Denies associated nausea.  Yesterday felt bloated and slight discomfort, but improved after having a bowel movement.  HPI  Past Medical History:  Diagnosis Date  . Abnormal stress ECG 10/06/2018  . Aortic valve sclerosis 10/06/2018  . Arthritis   . Asthma   . Bilateral carotid bruits 10/06/2018  . Headache    H/O bad HA, since using CPAP- she has had improvement with that issue   . Hypertension   . Laboratory examination 10/06/2018  . OSA on CPAP    settting - 8, uses CPAP q night     Patient Active Problem List   Diagnosis Date Noted  . Seasonal allergies 12/04/2018  . Allergic rhinoconjunctivitis 10/10/2018  . Moderate persistent asthma with acute exacerbation 10/10/2018  . Pruritus 10/10/2018  . Aortic valve sclerosis 10/06/2018  . Bilateral carotid bruits 10/06/2018  . Abnormal stress ECG 10/06/2018  . Laboratory examination 10/06/2018  . Palpitations 10/06/2018  . Total knee replacement status 09/19/2017  . Sprain of anterior talofibular ligament of right ankle 12/13/2016    Past Surgical History:  Procedure Laterality Date  .  CHOLECYSTECTOMY  1998  . TOTAL KNEE ARTHROPLASTY Right 09/19/2017   Procedure: RIGHT TOTAL KNEE ARTHROPLASTY;  Surgeon: Leandrew Koyanagi, MD;  Location: [facility];  Service: Orthopedics;  Laterality: Right;  . TUBAL LIGATION      OB History   No obstetric history on file.      Home Medications    Prior to Admission medications   Medication Sig Start Date End Date Taking? Authorizing Provider  aspirin EC 81 MG tablet Take 1 tablet (81 mg total) by mouth daily. 05/12/18   Clent Demark, PA-C  azelastine (ASTELIN) 0.1 % nasal spray Place 2 sprays into both nostrils 2 (two) times daily. 10/10/18   Dara Hoyer, FNP  budesonide-formoterol (SYMBICORT) 80-4.5 MCG/ACT inhaler 2 Puffs 2 times daily 01/15/19   Kennith Gain, MD  cetirizine HCl (ZYRTEC) 1 MG/ML solution Take 5 mLs (5 mg total) by mouth daily for 10 days. 02/02/19 02/12/19  Basma Buchner C, PA-C  chlorthalidone (HYGROTON) 25 MG tablet Take 1 tablet (25 mg total) by mouth daily. 09/09/18   [name], NP  Cholecalciferol (VITAMIN D3) 25 MCG (1000 UT) CAPS Take by mouth.    [provider]  fluticasone (FLONASE) 50 MCG/ACT nasal spray Place 2 sprays into both nostrils daily. 10/10/18   Dara Hoyer, FNP  guaiFENesin (MUCINEX) 600 MG 12 hr tablet Take 1 tablet (600 mg total) by mouth 2 (two) times daily. 12/04/18   Oletta Lamas,  Milford Cage, NP  latanoprost (XALATAN) 0.005 % ophthalmic solution INT 1 GTT IN OU QD IN THE EVE 12/05/17   [provider]  loratadine (CLARITIN) 10 MG tablet Take 1 tablet (10 mg total) by mouth daily. 01/15/19   Kennith Gain, MD  losartan (COZAAR) 100 MG tablet Take 1 tablet (100 mg total) by mouth daily. 12/04/18   [name], NP  Multiple Vitamins-Minerals (WOMENS 50+ MULTI VITAMIN/MIN PO) Take by mouth daily.    [provider]  nitroGLYCERIN (NITROSTAT) 0.4 MG SL tablet Place 1 tablet (0.4 mg total) under the tongue every 5 (five) minutes as needed for  chest pain. 05/12/18   Clent Demark, PA-C  omeprazole (PRILOSEC) 20 MG capsule Take 1 capsule (20 mg total) by mouth daily. 01/15/19   Kennith Gain, MD  potassium chloride SA (K-DUR,KLOR-CON) 20 MEQ tablet TK 1 T PO QD 10/08/18   [provider]  triamcinolone cream (KENALOG) 0.1 % Apply topically 2 (two) times daily. to affected area 01/15/19   Kennith Gain, MD  valACYclovir (VALTREX) 1000 MG tablet Take 1 tablet (1,000 mg total) by mouth 3 (three) times daily for 14 days. 02/02/19 02/16/19  Venera Privott, Elesa Hacker, PA-C    Family History Family History  Problem Relation Age of Onset  . Breast cancer Sister     Social History Social History   Tobacco Use  . Smoking status: Never Smoker  . Smokeless tobacco: Never Used  Substance Use Topics  . Alcohol use: Yes    Comment: sometimes wine  . Drug use: No     Allergies   Patient has no known allergies.   Review of Systems Review of Systems  Constitutional: Negative for fatigue and fever.  HENT: Negative for mouth sores.   Eyes: Negative for visual disturbance.  Respiratory: Negative for shortness of breath.   Cardiovascular: Negative for chest pain.  Gastrointestinal: Negative for abdominal pain, nausea and vomiting.  Musculoskeletal: Negative for arthralgias and joint swelling.  Skin: Positive for color change and rash. Negative for wound.  Neurological: Negative for dizziness, weakness, light-headedness and headaches.     Physical Exam Triage Vital Signs ED Triage Vitals [02/02/19 1629]  Enc Vitals Group     BP (!) 165/75     Pulse Rate 72     Resp 18     Temp 98.8 F (37.1 C)     Temp Source Oral     SpO2 100 %     Weight      Height      Head Circumference      Peak Flow      Pain Score      Pain Loc      Pain Edu?      Excl. in [place]?    No data found.  Updated Vital Signs BP (!) 165/75 (BP Location: Right Arm)   Pulse 72   Temp 98.8 F (37.1 C) (Oral)   Resp 18   SpO2  100%   Visual Acuity Right Eye Distance:   Left Eye Distance:   Bilateral Distance:    Right Eye Near:   Left Eye Near:    Bilateral Near:     Physical Exam Vitals signs and nursing note reviewed.  Constitutional:      General: She is not in acute distress.    Appearance: She is well-developed.  HENT:     Head: Normocephalic and atraumatic.  Eyes:     Conjunctiva/sclera: Conjunctivae  normal.     Comments: Wearing glasses  Neck:     Musculoskeletal: Neck supple.  Cardiovascular:     Rate and Rhythm: Normal rate and regular rhythm.     Heart sounds: No murmur.  Pulmonary:     Effort: Pulmonary effort is normal. No respiratory distress.     Breath sounds: Normal breath sounds.     Comments: Breathing comfortably at rest, CTABL, no wheezing, rales or other adventitious sounds auscultated Abdominal:     Palpations: Abdomen is soft.     Tenderness: There is no abdominal tenderness.  Skin:    General: Skin is warm and dry.     Comments: Erythematous papular and vesicular rash wrapping around left thoracic back to flank and anteriorly across abdomen  Neurological:     Mental Status: She is alert.      UC Treatments / Results  Labs (all labs ordered are listed, but only abnormal results are displayed) Labs Reviewed - No data to display  EKG   Radiology No results found.  Procedures Procedures (including critical care time)  Medications Ordered in UC Medications - No data to display  Initial Impression / Assessment and Plan / UC Course  I have reviewed the triage vital signs and the nursing notes.  Pertinent labs & imaging results that were available during my care of the patient were reviewed by me and considered in my medical decision making (see chart for details).     Rash on left side suggestive of shingles.  Will initiate on Valtrex 3 times daily x2 weeks.  Continue with Aleve for pain.  We will also initiate on daily cetirizine to help with itching she  experiences at work.  Continue to monitor,Discussed strict return precautions. Patient verbalized understanding and is agreeable with plan.  Final Clinical Impressions(s) / UC Diagnoses   Final diagnoses:  Rash and nonspecific skin eruption     Discharge Instructions     Read attached about shingles Begin valtrex 3 times a day for next 2 weeks May continue with aleeve for pain  Try daily cetirizine 5 mg for itching/allergies  Please follow up if pain or rash not resolving, or symptoms changing, rash spreading   ED Prescriptions    Medication Sig Dispense Auth. Provider   valACYclovir (VALTREX) 1000 MG tablet Take 1 tablet (1,000 mg total) by mouth 3 (three) times daily for 14 days. 42 tablet Kaden Dunkel C, PA-C   cetirizine HCl (ZYRTEC) 1 MG/ML solution Take 5 mLs (5 mg total) by mouth daily for 10 days. 60 mL Adysson Revelle C, PA-C     Controlled Substance Prescriptions [place] Controlled Substance Registry consulted? Not Applicable   Janith Lima, [place] 02/02/19 1706

## 2019-02-02 NOTE — ED Triage Notes (Signed)
Pt here with rash to left side that is painful and blistering; pt sts x several days; does not appear to cross midline

## 2019-02-10 ENCOUNTER — Ambulatory Visit (HOSPITAL_COMMUNITY)
Admission: EM | Admit: 2019-02-10 | Discharge: 2019-02-10 | Disposition: A | Discharge status: Home / Self Care | Payer: Medicare Other | Attending: Urgent Care | Admitting: Urgent Care

## 2019-02-10 ENCOUNTER — Ambulatory Visit (INDEPENDENT_AMBULATORY_CARE_PROVIDER_SITE_OTHER): Payer: Medicare Other | Admitting: Primary Care

## 2019-02-10 ENCOUNTER — Encounter (HOSPITAL_COMMUNITY): Payer: Self-pay | Admitting: Urgent Care

## 2019-02-10 ENCOUNTER — Other Ambulatory Visit: Payer: Self-pay

## 2019-02-10 DIAGNOSIS — R0682 Tachypnea, not elsewhere classified: Secondary | ICD-10-CM

## 2019-02-10 DIAGNOSIS — B0229 Other postherpetic nervous system involvement: Secondary | ICD-10-CM | POA: Diagnosis not present

## 2019-02-10 DIAGNOSIS — I10 Essential (primary) hypertension: Secondary | ICD-10-CM

## 2019-02-10 DIAGNOSIS — B029 Zoster without complications: Secondary | ICD-10-CM

## 2019-02-10 MED ORDER — GABAPENTIN 100 MG PO CAPS: 100.0000 mg | ORAL_CAPSULE | Freq: Three times a day (TID) | ORAL | 0 refills | Status: DC

## 2019-02-10 MED ORDER — TRAMADOL-ACETAMINOPHEN 37.5-325 MG PO TABS: 1.0000 | ORAL_TABLET | Freq: Four times a day (QID) | ORAL | 0 refills | Status: DC | PRN

## 2019-02-10 NOTE — ED Triage Notes (Signed)
Pt states she has shingles. Pt states she has pain all over where the shingles are at on her body.

## 2019-02-10 NOTE — ED Provider Notes (Signed)
MRN: [id number] DOB: 05-08-1946  Subjective:   Rachel Crane is a 73 y.o. female presenting for ongoing shingles rash now having severe pain over her rash in the left side of her body extending to her back.  Patient has been using extra strength Tylenol, muscle relaxer without any relief.  Has been taking Valtrex as prescribed previously at our clinic.  Denies history of heart disease, MI, stroke.  She is compliant with her blood pressure medications.  Previously saw a cardiologist, reports that she has not been back in some time.   Current Facility-Administered Medications:  .  magic mouthwash w/lidocaine, 1 mL, Oral, TID PRN, [name], NP  Current Outpatient Medications:  .  aspirin EC 81 MG tablet, Take 1 tablet (81 mg total) by mouth daily., Disp: 90 tablet, Rfl: 3 .  azelastine (ASTELIN) 0.1 % nasal spray, Place 2 sprays into both nostrils 2 (two) times daily., Disp: 30 mL, Rfl: 5 .  budesonide-formoterol (SYMBICORT) 80-4.5 MCG/ACT inhaler, 2 Puffs 2 times daily, Disp: 1 Inhaler, Rfl: 5 .  cetirizine HCl (ZYRTEC) 1 MG/ML solution, Take 5 mLs (5 mg total) by mouth daily for 10 days., Disp: 60 mL, Rfl: 0 .  chlorthalidone (HYGROTON) 25 MG tablet, Take 1 tablet (25 mg total) by mouth daily., Disp: 90 tablet, Rfl: 1 .  Cholecalciferol (VITAMIN D3) 25 MCG (1000 UT) CAPS, Take by mouth., Disp: , Rfl:  .  fluticasone (FLONASE) 50 MCG/ACT nasal spray, Place 2 sprays into both nostrils daily., Disp: 1 g, Rfl: 5 .  guaiFENesin (MUCINEX) 600 MG 12 hr tablet, Take 1 tablet (600 mg total) by mouth 2 (two) times daily., Disp: 20 tablet, Rfl: 0 .  latanoprost (XALATAN) 0.005 % ophthalmic solution, INT 1 GTT IN OU QD IN THE EVE, Disp: , Rfl: 10 .  loratadine (CLARITIN) 10 MG tablet, Take 1 tablet (10 mg total) by mouth daily., Disp: 30 tablet, Rfl: 5 .  losartan (COZAAR) 100 MG tablet, Take 1 tablet (100 mg total) by mouth daily., Disp: 30 tablet, Rfl: 3 .  Multiple Vitamins-Minerals (WOMENS 50+  MULTI VITAMIN/MIN PO), Take by mouth daily., Disp: , Rfl:  .  nitroGLYCERIN (NITROSTAT) 0.4 MG SL tablet, Place 1 tablet (0.4 mg total) under the tongue every 5 (five) minutes as needed for chest pain., Disp: 50 tablet, Rfl: 3 .  omeprazole (PRILOSEC) 20 MG capsule, Take 1 capsule (20 mg total) by mouth daily., Disp: 30 capsule, Rfl: 5 .  potassium chloride SA (K-DUR,KLOR-CON) 20 MEQ tablet, TK 1 T PO QD, Disp: , Rfl:  .  triamcinolone cream (KENALOG) 0.1 %, Apply topically 2 (two) times daily. to affected area, Disp: 454 g, Rfl: 5 .  valACYclovir (VALTREX) 1000 MG tablet, Take 1 tablet (1,000 mg total) by mouth 3 (three) times daily for 14 days., Disp: 42 tablet, Rfl: 0    No Known Allergies   Past Medical History:  Diagnosis Date  . Abnormal stress ECG 10/06/2018  . Aortic valve sclerosis 10/06/2018  . Arthritis   . Asthma   . Bilateral carotid bruits 10/06/2018  . Headache    H/O bad HA, since using CPAP- she has had improvement with that issue   . Hypertension   . Laboratory examination 10/06/2018  . OSA on CPAP    settting - 8, uses CPAP q night      Past Surgical History:  Procedure Laterality Date  . CHOLECYSTECTOMY  1998  . TOTAL KNEE ARTHROPLASTY Right 09/19/2017   Procedure: RIGHT  TOTAL KNEE ARTHROPLASTY;  Surgeon: Leandrew Koyanagi, MD;  Location: [facility];  Service: Orthopedics;  Laterality: Right;  . TUBAL LIGATION      ROS  Objective:   Vitals: BP (!) 151/64 (BP Location: Right Arm)   Pulse 80   Temp 97.9 F (36.6 C) (Temporal)   Resp 18   Wt 199 lb (90.3 kg)   SpO2 100%   BMI 32.12 kg/m   Physical Exam Exam conducted with a chaperone present.  Constitutional:      General: She is not in acute distress.    Appearance: Normal appearance. She is well-developed and normal weight. She is not ill-appearing, toxic-appearing or diaphoretic.  HENT:     Head: Normocephalic and atraumatic.     Right Ear: External ear normal.     Left Ear: External ear normal.      Nose: Nose normal.     Mouth/Throat:     Mouth: Mucous membranes are moist.     Pharynx: Oropharynx is clear.  Eyes:     General: No scleral icterus.    Extraocular Movements: Extraocular movements intact.     Pupils: Pupils are equal, round, and reactive to light.  Cardiovascular:     Rate and Rhythm: Normal rate and regular rhythm.     Pulses: Normal pulses.     Heart sounds: Normal heart sounds. No murmur. No friction rub. No gallop.   Pulmonary:     Effort: No respiratory distress.     Breath sounds: Normal breath sounds. No stridor. No wheezing, rhonchi or rales.     Comments: Tachypneic due to pain (from patient report). Chest:     Chest wall: Tenderness (With placing stethoscope on her skin while listening to heart and lungs) present.     Comments: RN Hassan Rowan helped as a female chaperone. Abdominal:     General: Bowel sounds are normal. There is no distension.     Palpations: Abdomen is soft. There is no mass.     Tenderness: There is abdominal tenderness (Superficial tenderness over left side mostly over rash). There is no right CVA tenderness, left CVA tenderness, guarding or rebound.  Skin:    General: Skin is warm and dry.     Coloration: Skin is not pale.     Findings: No rash.  Neurological:     General: No focal deficit present.     Mental Status: She is alert and oriented to person, place, and time.  Psychiatric:        Mood and Affect: Mood normal.        Behavior: Behavior normal.        Thought Content: Thought content normal.        Judgment: Judgment normal.     Assessment and Plan :   1. Post herpetic neuralgia   2. Herpes zoster without complication   3. Essential hypertension   4. Tachypnea     Patient is to maintain Valtrex.  I will provide her with Ultracet and gabapentin to help with the postherpetic neuralgia.  Emphasized need for her to establish follow-up with her cardiologist and PCP.  Counseled that due to her tachypnea would want to make  sure they rule out a cardiovascular source.  Patient verbalized understanding and is in agreement. Counseled patient on potential for adverse effects with medications prescribed/recommended today, ER and return-to-clinic precautions discussed, patient verbalized understanding.    Jaynee Eagles, PA-C 02/10/19 1141

## 2019-02-10 NOTE — Discharge Instructions (Addendum)
Ultracet can help with severe pain in addition to gabapentin for nerve type pain associated with your shingles rash.  However, Ultracet can also cause constipation and therefore I need you to use Colace (docusate) at 50 mg twice a day while you are taking Ultracet.  This will help avoid constipation caused by the medicine.  Make sure you pick this up as an over-the-counter medication, ask your pharmacist for assistance in finding the medicine.  Make sure that you set up a follow-up appointment with your heart doctor to rule out your heart being the source of your fast breathing.

## 2019-02-14 ENCOUNTER — Encounter (HOSPITAL_COMMUNITY): Payer: Self-pay | Admitting: Urgent Care

## 2019-02-14 ENCOUNTER — Other Ambulatory Visit: Payer: Self-pay

## 2019-02-14 ENCOUNTER — Ambulatory Visit (HOSPITAL_COMMUNITY)
Admission: EM | Admit: 2019-02-14 | Discharge: 2019-02-14 | Disposition: A | Discharge status: Home / Self Care | Payer: Medicare Other

## 2019-02-14 DIAGNOSIS — B029 Zoster without complications: Secondary | ICD-10-CM | POA: Diagnosis not present

## 2019-02-14 DIAGNOSIS — B0229 Other postherpetic nervous system involvement: Secondary | ICD-10-CM

## 2019-02-14 HISTORY — DX: Zoster without complications: B02.9

## 2019-02-14 MED ORDER — CAPSAICIN 0.033 % EX CREA: 1.0000 "application " | TOPICAL_CREAM | Freq: Two times a day (BID) | CUTANEOUS | 0 refills | Status: DC | PRN

## 2019-02-14 MED ORDER — HYDROCODONE-ACETAMINOPHEN 5-325 MG PO TABS: 2.0000 | ORAL_TABLET | Freq: Three times a day (TID) | ORAL | 0 refills | Status: DC | PRN

## 2019-02-14 MED ORDER — GABAPENTIN 100 MG PO CAPS: 100.0000 mg | ORAL_CAPSULE | Freq: Three times a day (TID) | ORAL | 0 refills | Status: DC

## 2019-02-14 NOTE — ED Triage Notes (Signed)
Pt dx with shingles 7/20; c/o significant pain that is not alleviated with tramadol.

## 2019-02-14 NOTE — ED Provider Notes (Signed)
MRN: [id number] DOB: January 24, 1946  Subjective:   Rachel Crane is a 73 y.o. female presenting for recheck on postherpetic neuralgia.  At her last office visit on 02/02/2019, patient was started on Ultracet, gabapentin.  She was advised to maintain her Valtrex.  Today, she reports ongoing severe pain over her rash not responding to Ultracet. Pharmacy did not fill her gabapentin. She has also tried calamine lotion with minimal relief. States her pain is severe and does not even let her sleep. She has not been able to go to work as a result.    Current Facility-Administered Medications:  .  magic mouthwash w/lidocaine, 1 mL, Oral, TID PRN, [name], NP  Current Outpatient Medications:  .  aspirin EC 81 MG tablet, Take 1 tablet (81 mg total) by mouth daily., Disp: 90 tablet, Rfl: 3 .  azelastine (ASTELIN) 0.1 % nasal spray, Place 2 sprays into both nostrils 2 (two) times daily., Disp: 30 mL, Rfl: 5 .  budesonide-formoterol (SYMBICORT) 80-4.5 MCG/ACT inhaler, 2 Puffs 2 times daily, Disp: 1 Inhaler, Rfl: 5 .  cetirizine HCl (ZYRTEC) 1 MG/ML solution, Take 5 mLs (5 mg total) by mouth daily for 10 days., Disp: 60 mL, Rfl: 0 .  chlorthalidone (HYGROTON) 25 MG tablet, Take 1 tablet (25 mg total) by mouth daily., Disp: 90 tablet, Rfl: 1 .  Cholecalciferol (VITAMIN D3) 25 MCG (1000 UT) CAPS, Take by mouth., Disp: , Rfl:  .  fluticasone (FLONASE) 50 MCG/ACT nasal spray, Place 2 sprays into both nostrils daily., Disp: 1 g, Rfl: 5 .  gabapentin (NEURONTIN) 100 MG capsule, Take 1 capsule (100 mg total) by mouth 3 (three) times daily., Disp: 30 capsule, Rfl: 0 .  guaiFENesin (MUCINEX) 600 MG 12 hr tablet, Take 1 tablet (600 mg total) by mouth 2 (two) times daily., Disp: 20 tablet, Rfl: 0 .  latanoprost (XALATAN) 0.005 % ophthalmic solution, INT 1 GTT IN OU QD IN THE EVE, Disp: , Rfl: 10 .  loratadine (CLARITIN) 10 MG tablet, Take 1 tablet (10 mg total) by mouth daily., Disp: 30 tablet, Rfl: 5 .  losartan  (COZAAR) 100 MG tablet, Take 1 tablet (100 mg total) by mouth daily., Disp: 30 tablet, Rfl: 3 .  Multiple Vitamins-Minerals (WOMENS 50+ MULTI VITAMIN/MIN PO), Take by mouth daily., Disp: , Rfl:  .  nitroGLYCERIN (NITROSTAT) 0.4 MG SL tablet, Place 1 tablet (0.4 mg total) under the tongue every 5 (five) minutes as needed for chest pain., Disp: 50 tablet, Rfl: 3 .  omeprazole (PRILOSEC) 20 MG capsule, Take 1 capsule (20 mg total) by mouth daily., Disp: 30 capsule, Rfl: 5 .  potassium chloride SA (K-DUR,KLOR-CON) 20 MEQ tablet, TK 1 T PO QD, Disp: , Rfl:  .  traMADol-acetaminophen (ULTRACET) 37.5-325 MG tablet, Take 1 tablet by mouth every 6 (six) hours as needed., Disp: 30 tablet, Rfl: 0 .  triamcinolone cream (KENALOG) 0.1 %, Apply topically 2 (two) times daily. to affected area, Disp: 454 g, Rfl: 5 .  valACYclovir (VALTREX) 1000 MG tablet, Take 1 tablet (1,000 mg total) by mouth 3 (three) times daily for 14 days., Disp: 42 tablet, Rfl: 0   No Known Allergies  Past Medical History:  Diagnosis Date  . Abnormal stress ECG 10/06/2018  . Aortic valve sclerosis 10/06/2018  . Arthritis   . Asthma   . Bilateral carotid bruits 10/06/2018  . Headache    H/O bad HA, since using CPAP- she has had improvement with that issue   . Hypertension   .  Laboratory examination 10/06/2018  . OSA on CPAP    settting - 8, uses CPAP q night      Past Surgical History:  Procedure Laterality Date  . CHOLECYSTECTOMY  1998  . TOTAL KNEE ARTHROPLASTY Right 09/19/2017   Procedure: RIGHT TOTAL KNEE ARTHROPLASTY;  Surgeon: Leandrew Koyanagi, MD;  Location: [facility];  Service: Orthopedics;  Laterality: Right;  . TUBAL LIGATION      ROS  Objective:   Vitals: BP 116/75   Pulse 81   Temp 98.6 F (37 C) (Oral)   Resp 20   SpO2 96%   Physical Exam Constitutional:      General: She is not in acute distress.    Appearance: Normal appearance. She is well-developed. She is not ill-appearing, toxic-appearing or diaphoretic.   HENT:     Head: Normocephalic and atraumatic.     Nose: Nose normal.     Mouth/Throat:     Mouth: Mucous membranes are moist.  Eyes:     Extraocular Movements: Extraocular movements intact.     Pupils: Pupils are equal, round, and reactive to light.  Cardiovascular:     Rate and Rhythm: Normal rate and regular rhythm.     Pulses: Normal pulses.     Heart sounds: Normal heart sounds. No murmur. No friction rub. No gallop.   Pulmonary:     Effort: Pulmonary effort is normal. No respiratory distress.     Breath sounds: Normal breath sounds. No stridor. No wheezing, rhonchi or rales.  Abdominal:     Tenderness: There is abdominal tenderness (Exquisite over the rash on her left upper abdomen extending to her flank and left thoracic back in dermatome pattern).  Skin:    General: Skin is warm and dry.     Findings: No rash.  Neurological:     Mental Status: She is alert and oriented to person, place, and time.  Psychiatric:        Mood and Affect: Mood normal.        Behavior: Behavior normal.        Thought Content: Thought content normal.      Assessment and Plan :   1. Herpes zoster without complication   2. Postherpetic neuralgia     We will have patient stop Ultracet, switch to hydrocodone.  Recent prescription for gabapentin.  We will also have her use capsaicin cream.  Another work note was provided at patient's request. Counseled patient on potential for adverse effects with medications prescribed/recommended today, ER and return-to-clinic precautions discussed, patient verbalized understanding.    Jaynee Eagles, PA-C 02/14/19 1055

## 2019-02-18 ENCOUNTER — Other Ambulatory Visit: Payer: Self-pay

## 2019-02-18 ENCOUNTER — Encounter (HOSPITAL_COMMUNITY): Payer: Self-pay | Admitting: Urgent Care

## 2019-02-18 ENCOUNTER — Ambulatory Visit (HOSPITAL_COMMUNITY)
Admission: EM | Admit: 2019-02-18 | Discharge: 2019-02-18 | Disposition: A | Discharge status: Home / Self Care | Payer: Medicare Other | Attending: Urgent Care | Admitting: Urgent Care

## 2019-02-18 DIAGNOSIS — R1084 Generalized abdominal pain: Secondary | ICD-10-CM

## 2019-02-18 DIAGNOSIS — B0229 Other postherpetic nervous system involvement: Secondary | ICD-10-CM

## 2019-02-18 DIAGNOSIS — B029 Zoster without complications: Secondary | ICD-10-CM

## 2019-02-18 MED ORDER — HYDROCODONE-ACETAMINOPHEN 5-325 MG PO TABS: 2.0000 | ORAL_TABLET | Freq: Three times a day (TID) | ORAL | 0 refills | Status: DC | PRN

## 2019-02-18 MED ORDER — GABAPENTIN 300 MG PO CAPS: 300.0000 mg | ORAL_CAPSULE | Freq: Three times a day (TID) | ORAL | 0 refills | Status: DC

## 2019-02-18 NOTE — ED Provider Notes (Signed)
MRN: [id number] DOB: [birth year]  Subjective:   Rachel Crane is a 73 y.o. female presenting for recheck on severe postherpetic neuralgia.  At her last office visit, patient turned in her tramadol and we started her on hydrocodone for her ongoing severe postherpetic neuralgia.  Also had her restart gabapentin and had patient try capsaicin cream.  Today, she reports her pain was helped by the combination of gabapentin and hydrocodone.  The pharmacy did not fill the capsaicin cream because they did not have it in stock.  She has tried Vaseline which does not help.  She would like to get a refill on medications that will help her like gabapentin and hydrocodone.  Denies fever, drainage of pus or bleeding from her rash.   Current Facility-Administered Medications:  .  magic mouthwash w/lidocaine, 1 mL, Oral, TID PRN, [name], NP  Current Outpatient Medications:  .  aspirin EC 81 MG tablet, Take 1 tablet (81 mg total) by mouth daily., Disp: 90 tablet, Rfl: 3 .  azelastine (ASTELIN) 0.1 % nasal spray, Place 2 sprays into both nostrils 2 (two) times daily., Disp: 30 mL, Rfl: 5 .  budesonide-formoterol (SYMBICORT) 80-4.5 MCG/ACT inhaler, 2 Puffs 2 times daily, Disp: 1 Inhaler, Rfl: 5 .  Capsaicin 0.033 % CREA, Apply 1 application topically 2 (two) times daily as needed., Disp: 15 g, Rfl: 0 .  cetirizine HCl (ZYRTEC) 1 MG/ML solution, Take 5 mLs (5 mg total) by mouth daily for 10 days., Disp: 60 mL, Rfl: 0 .  chlorthalidone (HYGROTON) 25 MG tablet, Take 1 tablet (25 mg total) by mouth daily., Disp: 90 tablet, Rfl: 1 .  Cholecalciferol (VITAMIN D3) 25 MCG (1000 UT) CAPS, Take by mouth., Disp: , Rfl:  .  fluticasone (FLONASE) 50 MCG/ACT nasal spray, Place 2 sprays into both nostrils daily., Disp: 1 g, Rfl: 5 .  gabapentin (NEURONTIN) 100 MG capsule, Take 1 capsule (100 mg total) by mouth 3 (three) times daily., Disp: 30 capsule, Rfl: 0 .  guaiFENesin (MUCINEX) 600 MG 12 hr tablet, Take 1 tablet (600  mg total) by mouth 2 (two) times daily., Disp: 20 tablet, Rfl: 0 .  HYDROCHLOROTHIAZIDE PO, Take by mouth., Disp: , Rfl:  .  HYDROcodone-acetaminophen (NORCO/VICODIN) 5-325 MG tablet, Take 2 tablets by mouth every 8 (eight) hours as needed for up to 5 days for severe pain., Disp: 15 tablet, Rfl: 0 .  latanoprost (XALATAN) 0.005 % ophthalmic solution, INT 1 GTT IN OU QD IN THE EVE, Disp: , Rfl: 10 .  loratadine (CLARITIN) 10 MG tablet, Take 1 tablet (10 mg total) by mouth daily., Disp: 30 tablet, Rfl: 5 .  losartan (COZAAR) 100 MG tablet, Take 1 tablet (100 mg total) by mouth daily., Disp: 30 tablet, Rfl: 3 .  Multiple Vitamins-Minerals (WOMENS 50+ MULTI VITAMIN/MIN PO), Take by mouth daily., Disp: , Rfl:  .  nitroGLYCERIN (NITROSTAT) 0.4 MG SL tablet, Place 1 tablet (0.4 mg total) under the tongue every 5 (five) minutes as needed for chest pain., Disp: 50 tablet, Rfl: 3 .  omeprazole (PRILOSEC) 20 MG capsule, Take 1 capsule (20 mg total) by mouth daily., Disp: 30 capsule, Rfl: 5 .  potassium chloride SA (K-DUR,KLOR-CON) 20 MEQ tablet, TK 1 T PO QD, Disp: , Rfl:  .  traMADol-acetaminophen (ULTRACET) 37.5-325 MG tablet, Take 1 tablet by mouth every 6 (six) hours as needed., Disp: 30 tablet, Rfl: 0 .  triamcinolone cream (KENALOG) 0.1 %, Apply topically 2 (two) times daily. to affected area, Disp:  454 g, Rfl: 5   No Known Allergies  Past Medical History:  Diagnosis Date  . Abnormal stress ECG 10/06/2018  . Aortic valve sclerosis 10/06/2018  . Arthritis   . Asthma   . Bilateral carotid bruits 10/06/2018  . Headache    H/O bad HA, since using CPAP- she has had improvement with that issue   . Hypertension   . Laboratory examination 10/06/2018  . OSA on CPAP    settting - 8, uses CPAP q night   . Shingles      Past Surgical History:  Procedure Laterality Date  . CHOLECYSTECTOMY  1998  . TOTAL KNEE ARTHROPLASTY Right 09/19/2017   Procedure: RIGHT TOTAL KNEE ARTHROPLASTY;  Surgeon: Leandrew Koyanagi,  MD;  Location: [facility];  Service: Orthopedics;  Laterality: Right;  . TUBAL LIGATION      ROS  Objective:   Vitals: BP 117/62 (BP Location: Right Arm)   Pulse 83   Temp 98.3 F (36.8 C) (Oral)   Resp 18   SpO2 98%   Physical Exam Exam conducted with a chaperone present Market researcher).  Constitutional:      General: She is not in acute distress.    Appearance: Normal appearance. She is well-developed. She is not ill-appearing.  HENT:     Head: Normocephalic and atraumatic.     Nose: Nose normal.     Mouth/Throat:     Mouth: Mucous membranes are moist.     Pharynx: Oropharynx is clear.  Eyes:     General: No scleral icterus.    Extraocular Movements: Extraocular movements intact.     Pupils: Pupils are equal, round, and reactive to light.  Cardiovascular:     Rate and Rhythm: Normal rate.  Pulmonary:     Effort: Pulmonary effort is normal.  Skin:    General: Skin is warm and dry.  Neurological:     General: No focal deficit present.     Mental Status: She is alert and oriented to person, place, and time.  Psychiatric:        Mood and Affect: Mood normal.        Behavior: Behavior normal.    Assessment and Plan :   1. Postherpetic neuralgia   2. Generalized abdominal pain   3. Herpes zoster without complication    Patient is having persistent postherpetic neuralgia.  She has done best with hydrocodone and gabapentin.  Patient has been very compliant with a controlled substance.  Counseled the I will increase her gabapentin dosing and refill the hydrocodone.  Emphasized need to use hydrocodone only as needed.  Patient is very agreeable to this.  Verbalized understanding the nature of controlled substances and potential for complications.  [place] controlled substance database shows compliance. Counseled patient on potential for adverse effects with medications prescribed/recommended today, ER and return-to-clinic precautions discussed, patient verbalized  understanding.   Regarding a return to work note, patient has been out due to her severe pain for the last 2 to 3 weeks.  She only works 4-8 hours per week and was told by her employer that she is okay to come back when she is better.  They requested a work note stating that she is okay to come back.  Patient will not need short-term disability or FMLA.  Okay to provide with work note at patient's request.   Jaynee Eagles, Hershal Coria 02/18/19 1829

## 2019-02-18 NOTE — ED Triage Notes (Signed)
Pt here for shingles and sts still having pain and rash and needs more medication

## 2019-02-26 ENCOUNTER — Other Ambulatory Visit: Payer: Self-pay

## 2019-02-26 ENCOUNTER — Encounter (HOSPITAL_COMMUNITY): Payer: Self-pay

## 2019-02-26 ENCOUNTER — Telehealth (HOSPITAL_COMMUNITY): Payer: Self-pay | Admitting: Emergency Medicine

## 2019-02-26 ENCOUNTER — Ambulatory Visit (HOSPITAL_COMMUNITY)
Admission: EM | Admit: 2019-02-26 | Discharge: 2019-02-26 | Disposition: A | Discharge status: Home / Self Care | Payer: Medicare Other | Attending: Family Medicine | Admitting: Family Medicine

## 2019-02-26 DIAGNOSIS — B0229 Other postherpetic nervous system involvement: Secondary | ICD-10-CM

## 2019-02-26 MED ORDER — AMITRIPTYLINE HCL 10 MG PO TABS: 10.0000 mg | ORAL_TABLET | Freq: Every day | ORAL | 0 refills | Status: DC

## 2019-02-26 MED ORDER — LIDOCAINE 5 % EX PTCH: 1.0000 | MEDICATED_PATCH | CUTANEOUS | 0 refills | Status: DC

## 2019-02-26 NOTE — ED Provider Notes (Signed)
[facility]    CSN: [id number] Arrival date & time: 02/26/19  1128      History   Chief Complaint Chief Complaint  Patient presents with  . still having shingle pain    HPI Rachel Crane is a 73 y.o. female.   HPI  Patient has had several visits in the last couple of weeks because of postherpetic neuralgia.  Originally seen for shingles.  Given acyclovir.  Ever since then has complained of pain.  She has been tried on Ultracet.  When this did not help she was given hydrocodone.  She is also been given gabapentin and capsaicin.  She gets the best result from a combination of gabapentin 300 3 times daily plus hydrocodone.  I explained to her that I am not going to keep her on hydrocodone.  She is here very demonstrative, grabbing her chest.  Climbing gets her heart.  Stating that she cannot continue to cope with the level of pain. She has been told on more than one occasion to see her primary care doctor.  Unfortunately, she has become disillusioned with her primary care doctor and is trying to see a new primary care doctor.  I explained to her that this is not a good time to change primary care doctors, there is no guarantee that she will continue to get all of her medicines prescribed in the meantime.  She now was not able to see anyone until September. Complains of a burning pain in the distribution of her rash.  Rash is gone.  Complains of chest pain in the same area but it is under her breast.  No substernal pressure, shortness of breath, radiation, diaphoresis, or evidence of cardiac chest pain.  Past Medical History:  Diagnosis Date  . Abnormal stress ECG 10/06/2018  . Aortic valve sclerosis 10/06/2018  . Arthritis   . Asthma   . Bilateral carotid bruits 10/06/2018  . Headache    H/O bad HA, since using CPAP- she has had improvement with that issue   . Hypertension   . Laboratory examination 10/06/2018  . OSA on CPAP    settting - 8, uses CPAP q night   . Shingles     Patient Active Problem List   Diagnosis Date Noted  . Seasonal allergies 12/04/2018  . Allergic rhinoconjunctivitis 10/10/2018  . Moderate persistent asthma with acute exacerbation 10/10/2018  . Pruritus 10/10/2018  . Aortic valve sclerosis 10/06/2018  . Bilateral carotid bruits 10/06/2018  . Abnormal stress ECG 10/06/2018  . Laboratory examination 10/06/2018  . Palpitations 10/06/2018  . Total knee replacement status 09/19/2017  . Sprain of anterior talofibular ligament of right ankle 12/13/2016    Past Surgical History:  Procedure Laterality Date  . CHOLECYSTECTOMY  1998  . TOTAL KNEE ARTHROPLASTY Right 09/19/2017   Procedure: RIGHT TOTAL KNEE ARTHROPLASTY;  Surgeon: Leandrew Koyanagi, MD;  Location: [facility];  Service: Orthopedics;  Laterality: Right;  . TUBAL LIGATION      OB History   No obstetric history on file.      Home Medications    Prior to Admission medications   Medication Sig Start Date End Date Taking? Authorizing Provider  amitriptyline (ELAVIL) 10 MG tablet Take 1 tablet (10 mg total) by mouth at bedtime. 02/26/19   Raylene Everts, MD  aspirin EC 81 MG tablet Take 1 tablet (81 mg total) by mouth daily. 05/12/18   Clent Demark, PA-C  azelastine (ASTELIN) 0.1 % nasal spray Place 2 sprays  into both nostrils 2 (two) times daily. 10/10/18   Dara Hoyer, FNP  budesonide-formoterol (SYMBICORT) 80-4.5 MCG/ACT inhaler 2 Puffs 2 times daily 01/15/19   Kennith Gain, MD  chlorthalidone (HYGROTON) 25 MG tablet Take 1 tablet (25 mg total) by mouth daily. 09/09/18   [name], NP  Cholecalciferol (VITAMIN D3) 25 MCG (1000 UT) CAPS Take by mouth.    [provider]  fluticasone (FLONASE) 50 MCG/ACT nasal spray Place 2 sprays into both nostrils daily. 10/10/18   Dara Hoyer, FNP  gabapentin (NEURONTIN) 300 MG capsule Take 1 capsule (300 mg total) by mouth 3 (three) times daily. 02/18/19   Jaynee Eagles, PA-C  guaiFENesin (MUCINEX) 600 MG 12 hr  tablet Take 1 tablet (600 mg total) by mouth 2 (two) times daily. 12/04/18   [name], NP  HYDROCHLOROTHIAZIDE PO Take by mouth.    [provider]  latanoprost (XALATAN) 0.005 % ophthalmic solution INT 1 GTT IN OU QD IN THE EVE 12/05/17   [provider]  lidocaine (LIDODERM) 5 % Place 1 patch onto the skin daily. Remove & Discard patch within 12 hours or as directed by MD 02/26/19   Raylene Everts, MD  loratadine (CLARITIN) 10 MG tablet Take 1 tablet (10 mg total) by mouth daily. 01/15/19   Kennith Gain, MD  losartan (COZAAR) 100 MG tablet Take 1 tablet (100 mg total) by mouth daily. 12/04/18   [name], NP  Multiple Vitamins-Minerals (WOMENS 50+ MULTI VITAMIN/MIN PO) Take by mouth daily.    [provider]  nitroGLYCERIN (NITROSTAT) 0.4 MG SL tablet Place 1 tablet (0.4 mg total) under the tongue every 5 (five) minutes as needed for chest pain. 05/12/18   Clent Demark, PA-C  omeprazole (PRILOSEC) 20 MG capsule Take 1 capsule (20 mg total) by mouth daily. 01/15/19   Kennith Gain, MD  potassium chloride SA (K-DUR,KLOR-CON) 20 MEQ tablet TK 1 T PO QD 10/08/18   [provider]  triamcinolone cream (KENALOG) 0.1 % Apply topically 2 (two) times daily. to affected area 01/15/19   Kennith Gain, MD  cetirizine HCl (ZYRTEC) 1 MG/ML solution Take 5 mLs (5 mg total) by mouth daily for 10 days. 02/02/19 02/26/19  Wieters, Elesa Hacker, PA-C    Family History Family History  Problem Relation Age of Onset  . Breast cancer Sister     Social History Social History   Tobacco Use  . Smoking status: Never Smoker  . Smokeless tobacco: Never Used  Substance Use Topics  . Alcohol use: Yes    Comment: sometimes wine  . Drug use: No     Allergies   Patient has no known allergies.   Review of Systems Review of Systems  Constitutional: Negative for chills and fever.  HENT: Negative for ear pain and sore throat.    Eyes: Negative for pain and visual disturbance.  Respiratory: Negative for cough and shortness of breath.   Cardiovascular: Negative for chest pain and palpitations.  Gastrointestinal: Negative for abdominal pain and vomiting.  Genitourinary: Negative for dysuria and hematuria.  Musculoskeletal: Negative for arthralgias and back pain.  Skin: Negative for color change and rash.       Pain in distribution of shingles  Neurological: Negative for seizures and syncope.  All other systems reviewed and are negative.    Physical Exam Triage Vital Signs ED Triage Vitals  Enc Vitals Group     BP 02/26/19 1152 139/63  Pulse Rate 02/26/19 1152 78     Resp 02/26/19 1152 18     Temp 02/26/19 1152 98.9 F (37.2 C)     Temp Source 02/26/19 1152 Oral     SpO2 02/26/19 1152 98 %     Weight 02/26/19 1150 197 lb (89.4 kg)     Height --      Head Circumference --      Peak Flow --      Pain Score 02/26/19 1150 10     Pain Loc --      Pain Edu? --      Excl. in [place]? --    No data found.  Updated Vital Signs BP 139/63 (BP Location: Right Arm)   Pulse 78   Temp 98.9 F (37.2 C) (Oral)   Resp 18   Wt 89.4 kg   SpO2 98%   BMI 31.80 kg/m   Visual Acuity Right Eye Distance:   Left Eye Distance:   Bilateral Distance:    Right Eye Near:   Left Eye Near:    Bilateral Near:     Physical Exam Constitutional:      General: She is not in acute distress.    Appearance: She is well-developed.  HENT:     Head: Normocephalic and atraumatic.  Eyes:     Conjunctiva/sclera: Conjunctivae normal.     Pupils: Pupils are equal, round, and reactive to light.  Neck:     Musculoskeletal: Normal range of motion.  Cardiovascular:     Rate and Rhythm: Normal rate and regular rhythm.     Heart sounds: Normal heart sounds.  Pulmonary:     Effort: Pulmonary effort is normal. No respiratory distress.     Breath sounds: Normal breath sounds.  Abdominal:     General: Abdomen is flat. There is no  distension.     Palpations: Abdomen is soft.  Musculoskeletal: Normal range of motion.  Skin:    General: Skin is warm and dry.     Comments: Around the left thorax, under the left breast there are markings consistent with healed herpes zoster.  No open lesions or crusting present  Neurological:     Mental Status: She is alert.      UC Treatments / Results  Labs (all labs ordered are listed, but only abnormal results are displayed) Labs Reviewed - No data to display  EKG EKG is done.  Normal sinus rhythm.  Normal intervals.  There are ST and T wave changes that are identical to EKG in 2018.  No change   Radiology No results found.  Procedures Procedures (including critical care time)  Medications Ordered in UC Medications - No data to display  Initial Impression / Assessment and Plan / UC Course  I have reviewed the triage vital signs and the nursing notes.  Pertinent labs & imaging results that were available during my care of the patient were reviewed by me and considered in my medical decision making (see chart for details).     Postherpetic neuralgia is discussed with patient.  This can be long-term.  It is inappropriate to continue narcotics. Final Clinical Impressions(s) / UC Diagnoses   Final diagnoses:  Postherpetic neuralgia     Discharge Instructions     Continue taking the gabapentin 3 times a day Add amitriptyline 1 pill 30 minutes prior to bedtime Add Lidoderm patch.  It is used on 12 hours/off 12 hours.  Placed on 1 hour before bedtime and removed 12  hours later Follow-up with your new primary care doctor as scheduled Call here for problems or refills   ED Prescriptions    Medication Sig Dispense Auth. Provider   lidocaine (LIDODERM) 5 % Place 1 patch onto the skin daily. Remove & Discard patch within 12 hours or as directed by MD 30 patch Meda Coffee Jennette Banker, MD   amitriptyline (ELAVIL) 10 MG tablet Take 1 tablet (10 mg total) by mouth at bedtime.  30 tablet Raylene Everts, MD     Controlled Substance Prescriptions [place] Controlled Substance Registry consulted? Not Applicable   Raylene Everts, MD 02/26/19 [phone]

## 2019-02-26 NOTE — ED Triage Notes (Signed)
Pt states she has pain still after having the shingles. Pt states her left side and back hurts.

## 2019-02-26 NOTE — Discharge Instructions (Signed)
Continue taking the gabapentin 3 times a day Add amitriptyline 1 pill 30 minutes prior to bedtime Add Lidoderm patch.  It is used on 12 hours/off 12 hours.  Placed on 1 hour before bedtime and removed 12 hours later Follow-up with your new primary care doctor as scheduled Call here for problems or refills

## 2019-02-26 NOTE — Telephone Encounter (Signed)
Pt called stated needing prior auth for lidocaine patch; called pharmacy and they said they will call her and suggest she purchase the over the counter product if cost is an issue

## 2019-03-10 ENCOUNTER — Ambulatory Visit (INDEPENDENT_AMBULATORY_CARE_PROVIDER_SITE_OTHER): Payer: Medicare Other | Admitting: Primary Care

## 2019-03-16 ENCOUNTER — Ambulatory Visit: Payer: Medicare Other

## 2019-03-30 ENCOUNTER — Ambulatory Visit (HOSPITAL_COMMUNITY)
Admission: EM | Admit: 2019-03-30 | Discharge: 2019-03-30 | Disposition: A | Discharge status: Home / Self Care | Payer: Medicare Other | Attending: Family Medicine | Admitting: Family Medicine

## 2019-03-30 ENCOUNTER — Encounter (HOSPITAL_COMMUNITY): Payer: Self-pay | Admitting: Emergency Medicine

## 2019-03-30 ENCOUNTER — Other Ambulatory Visit: Payer: Self-pay

## 2019-03-30 DIAGNOSIS — E1169 Type 2 diabetes mellitus with other specified complication: Secondary | ICD-10-CM | POA: Diagnosis not present

## 2019-03-30 DIAGNOSIS — Z760 Encounter for issue of repeat prescription: Secondary | ICD-10-CM

## 2019-03-30 DIAGNOSIS — I10 Essential (primary) hypertension: Secondary | ICD-10-CM | POA: Diagnosis not present

## 2019-03-30 DIAGNOSIS — G4700 Insomnia, unspecified: Secondary | ICD-10-CM | POA: Diagnosis not present

## 2019-03-30 DIAGNOSIS — G4733 Obstructive sleep apnea (adult) (pediatric): Secondary | ICD-10-CM | POA: Diagnosis not present

## 2019-03-30 DIAGNOSIS — B0229 Other postherpetic nervous system involvement: Secondary | ICD-10-CM | POA: Diagnosis not present

## 2019-03-30 MED ORDER — GABAPENTIN 300 MG PO CAPS: 300.0000 mg | ORAL_CAPSULE | Freq: Three times a day (TID) | ORAL | 0 refills | Status: DC

## 2019-03-30 NOTE — Discharge Instructions (Signed)
It does appear that your doctor today had intended to refill your medication. It is appropriate for your doctor to refill and manage your medications.  I recommend that you call your doctor's office to check that the script was sent to the pharmacy, since the pharmacy had not received it.  I have refilled a few days for you so you do not lapse as this gets sorted.

## 2019-03-30 NOTE — ED Triage Notes (Signed)
Pt here for medication refill; pt seen here multiple times for same

## 2019-03-30 NOTE — ED Provider Notes (Signed)
[place]    CSN: [id number] Arrival date & time: 03/30/19  1444      History   Chief Complaint Chief Complaint  Patient presents with  . Medication Refill    HPI Rachel Crane is a 73 y.o. female.   Salyna Kiester presents with requests for gabapentin refill. She recently had shingles, received treatment for this, and has since been seen multiple times for post herpetic neuralgia. She saw her PCP today, establish care with them. Per their AVS she brings in with her her gabapentin was refilled, with 5 refills. Patient went to the pharmacy and they did not have the gabapentin and told her to come here for refill, as this clinic had most recently refilled it. Patient still has a few tablets left. She still has some pain to healed rash to chest. History  Of arthritis, asthma, headache, htn, OSA, aortic sclerosis.     ROS per HPI, negative if not otherwise mentioned.      Past Medical History:  Diagnosis Date  . Abnormal stress ECG 10/06/2018  . Aortic valve sclerosis 10/06/2018  . Arthritis   . Asthma   . Bilateral carotid bruits 10/06/2018  . Headache    H/O bad HA, since using CPAP- she has had improvement with that issue   . Hypertension   . Laboratory examination 10/06/2018  . OSA on CPAP    settting - 8, uses CPAP q night   . Shingles     Patient Active Problem List   Diagnosis Date Noted  . Seasonal allergies 12/04/2018  . Allergic rhinoconjunctivitis 10/10/2018  . Moderate persistent asthma with acute exacerbation 10/10/2018  . Pruritus 10/10/2018  . Aortic valve sclerosis 10/06/2018  . Bilateral carotid bruits 10/06/2018  . Abnormal stress ECG 10/06/2018  . Laboratory examination 10/06/2018  . Palpitations 10/06/2018  . Total knee replacement status 09/19/2017  . Sprain of anterior talofibular ligament of right ankle 12/13/2016    Past Surgical History:  Procedure Laterality Date  . CHOLECYSTECTOMY  1998  . TOTAL KNEE ARTHROPLASTY Right 09/19/2017    Procedure: RIGHT TOTAL KNEE ARTHROPLASTY;  Surgeon: Leandrew Koyanagi, MD;  Location: [facility];  Service: Orthopedics;  Laterality: Right;  . TUBAL LIGATION      OB History   No obstetric history on file.      Home Medications    Prior to Admission medications   Medication Sig Start Date End Date Taking? Authorizing Provider  amitriptyline (ELAVIL) 10 MG tablet Take 1 tablet (10 mg total) by mouth at bedtime. 02/26/19   Raylene Everts, MD  aspirin EC 81 MG tablet Take 1 tablet (81 mg total) by mouth daily. 05/12/18   Clent Demark, PA-C  azelastine (ASTELIN) 0.1 % nasal spray Place 2 sprays into both nostrils 2 (two) times daily. 10/10/18   Dara Hoyer, FNP  budesonide-formoterol (SYMBICORT) 80-4.5 MCG/ACT inhaler 2 Puffs 2 times daily 01/15/19   Kennith Gain, MD  chlorthalidone (HYGROTON) 25 MG tablet Take 1 tablet (25 mg total) by mouth daily. 09/09/18   [name], NP  Cholecalciferol (VITAMIN D3) 25 MCG (1000 UT) CAPS Take by mouth.    [provider]  fluticasone (FLONASE) 50 MCG/ACT nasal spray Place 2 sprays into both nostrils daily. 10/10/18   Dara Hoyer, FNP  gabapentin (NEURONTIN) 300 MG capsule Take 1 capsule (300 mg total) by mouth 3 (three) times daily for 3 days. 03/30/19 04/02/19  Zigmund Gottron, NP  guaiFENesin [facility])  600 MG 12 hr tablet Take 1 tablet (600 mg total) by mouth 2 (two) times daily. 12/04/18   [name], NP  HYDROCHLOROTHIAZIDE PO Take by mouth.    [provider]  latanoprost (XALATAN) 0.005 % ophthalmic solution INT 1 GTT IN OU QD IN THE EVE 12/05/17   [provider]  lidocaine (LIDODERM) 5 % Place 1 patch onto the skin daily. Remove & Discard patch within 12 hours or as directed by MD 02/26/19   Raylene Everts, MD  loratadine (CLARITIN) 10 MG tablet Take 1 tablet (10 mg total) by mouth daily. 01/15/19   Kennith Gain, MD  losartan (COZAAR) 100 MG tablet Take 1 tablet (100 mg total)  by mouth daily. 12/04/18   [name], NP  Multiple Vitamins-Minerals (WOMENS 50+ MULTI VITAMIN/MIN PO) Take by mouth daily.    [provider]  nitroGLYCERIN (NITROSTAT) 0.4 MG SL tablet Place 1 tablet (0.4 mg total) under the tongue every 5 (five) minutes as needed for chest pain. 05/12/18   Clent Demark, PA-C  omeprazole (PRILOSEC) 20 MG capsule Take 1 capsule (20 mg total) by mouth daily. 01/15/19   Kennith Gain, MD  potassium chloride SA (K-DUR,KLOR-CON) 20 MEQ tablet TK 1 T PO QD 10/08/18   [provider]  triamcinolone cream (KENALOG) 0.1 % Apply topically 2 (two) times daily. to affected area 01/15/19   Kennith Gain, MD  cetirizine HCl (ZYRTEC) 1 MG/ML solution Take 5 mLs (5 mg total) by mouth daily for 10 days. 02/02/19 02/26/19  Wieters, Elesa Hacker, PA-C    Family History Family History  Problem Relation Age of Onset  . Breast cancer Sister     Social History Social History   Tobacco Use  . Smoking status: Never Smoker  . Smokeless tobacco: Never Used  Substance Use Topics  . Alcohol use: Yes    Comment: sometimes wine  . Drug use: No     Allergies   Patient has no known allergies.   Review of Systems Review of Systems   Physical Exam Triage Vital Signs ED Triage Vitals [03/30/19 1515]  Enc Vitals Group     BP (!) 140/56     Pulse Rate 79     Resp 18     Temp 98.8 F (37.1 C)     Temp Source Oral     SpO2 100 %     Weight      Height      Head Circumference      Peak Flow      Pain Score 6     Pain Loc      Pain Edu?      Excl. in [place]?    No data found.  Updated Vital Signs BP (!) 140/56 (BP Location: Right Arm)   Pulse 79   Temp 98.8 F (37.1 C) (Oral)   Resp 18   SpO2 100%   Visual Acuity Right Eye Distance:   Left Eye Distance:   Bilateral Distance:    Right Eye Near:   Left Eye Near:    Bilateral Near:     Physical Exam Constitutional:      General: She is not in acute distress.     Appearance: She is well-developed.  Cardiovascular:     Rate and Rhythm: Normal rate.  Pulmonary:     Effort: Pulmonary effort is normal.  Skin:    General: Skin is warm and dry.  Comments: Healed rash to chest noted   Neurological:     Mental Status: She is alert and oriented to person, place, and time.      UC Treatments / Results  Labs (all labs ordered are listed, but only abnormal results are displayed) Labs Reviewed - No data to display  EKG   Radiology No results found.  Procedures Procedures (including critical care time)  Medications Ordered in UC Medications - No data to display  Initial Impression / Assessment and Plan / UC Course  I have reviewed the triage vital signs and the nursing notes.  Pertinent labs & imaging results that were available during my care of the patient were reviewed by me and considered in my medical decision making (see chart for details).     Per AVS in patients hands gabapentin was refilled, with 5 refills. Patient is not out of her medication. A few extra days given at this time, with recommendation to call her PCP office to double check it was sent and which pharmacy, as it appears evident that her PCP will be managing this medication, appropriately.   Final Clinical Impressions(s) / UC Diagnoses   Final diagnoses:  Medication refill  Post herpetic neuralgia     Discharge Instructions     It does appear that your doctor today had intended to refill your medication. It is appropriate for your doctor to refill and manage your medications.  I recommend that you call your doctor's office to check that the script was sent to the pharmacy, since the pharmacy had not received it.  I have refilled a few days for you so you do not lapse as this gets sorted.    ED Prescriptions    Medication Sig Dispense Auth. Provider   gabapentin (NEURONTIN) 300 MG capsule Take 1 capsule (300 mg total) by mouth 3 (three) times daily for 3  days. 9 capsule Augusto Gamble B, NP     Controlled Substance Prescriptions [place] Controlled Substance Registry consulted? Not Applicable   Zigmund Gottron, NP 03/30/19 1919

## 2019-04-07 ENCOUNTER — Ambulatory Visit: Payer: Medicare Other

## 2019-04-10 ENCOUNTER — Telehealth: Payer: Self-pay | Admitting: Orthopaedic Surgery

## 2019-04-10 NOTE — Telephone Encounter (Signed)
Is this okay?

## 2019-04-10 NOTE — Telephone Encounter (Signed)
Patient called. She says she needs a letter for work stating that she can not stand no more than 30 minutes. Her call back number is [phone]

## 2019-04-10 NOTE — Telephone Encounter (Signed)
Note ready for pick up. Tried calling patient twice and phone just beeps.

## 2019-04-10 NOTE — Telephone Encounter (Signed)
yes

## 2019-04-14 ENCOUNTER — Ambulatory Visit: Payer: Medicare Other | Admitting: Family Medicine

## 2019-04-15 ENCOUNTER — Encounter: Payer: Self-pay | Admitting: Family Medicine

## 2019-04-17 ENCOUNTER — Emergency Department (HOSPITAL_COMMUNITY): Payer: Medicare Other

## 2019-04-17 ENCOUNTER — Other Ambulatory Visit: Payer: Self-pay

## 2019-04-17 ENCOUNTER — Inpatient Hospital Stay (HOSPITAL_COMMUNITY)
Admission: EM | Admit: 2019-04-17 | Discharge: 2019-04-22 | DRG: 552 | Disposition: A | Discharge status: Home Health | Payer: Medicare Other | Attending: Neurosurgery | Admitting: Neurosurgery

## 2019-04-17 DIAGNOSIS — Z20828 Contact with and (suspected) exposure to other viral communicable diseases: Secondary | ICD-10-CM | POA: Diagnosis present

## 2019-04-17 DIAGNOSIS — Z96651 Presence of right artificial knee joint: Secondary | ICD-10-CM | POA: Diagnosis not present

## 2019-04-17 DIAGNOSIS — Z7982 Long term (current) use of aspirin: Secondary | ICD-10-CM

## 2019-04-17 DIAGNOSIS — M545 Low back pain, unspecified: Secondary | ICD-10-CM

## 2019-04-17 DIAGNOSIS — Z79899 Other long term (current) drug therapy: Secondary | ICD-10-CM

## 2019-04-17 DIAGNOSIS — Z7951 Long term (current) use of inhaled steroids: Secondary | ICD-10-CM

## 2019-04-17 DIAGNOSIS — E669 Obesity, unspecified: Secondary | ICD-10-CM | POA: Diagnosis present

## 2019-04-17 DIAGNOSIS — Z6833 Body mass index (BMI) 33.0-33.9, adult: Secondary | ICD-10-CM

## 2019-04-17 DIAGNOSIS — M5124 Other intervertebral disc displacement, thoracic region: Secondary | ICD-10-CM | POA: Diagnosis not present

## 2019-04-17 DIAGNOSIS — M4804 Spinal stenosis, thoracic region: Principal | ICD-10-CM | POA: Diagnosis present

## 2019-04-17 DIAGNOSIS — J45909 Unspecified asthma, uncomplicated: Secondary | ICD-10-CM | POA: Diagnosis present

## 2019-04-17 DIAGNOSIS — R5381 Other malaise: Secondary | ICD-10-CM | POA: Diagnosis not present

## 2019-04-17 DIAGNOSIS — Z803 Family history of malignant neoplasm of breast: Secondary | ICD-10-CM

## 2019-04-17 DIAGNOSIS — I10 Essential (primary) hypertension: Secondary | ICD-10-CM | POA: Diagnosis not present

## 2019-04-17 DIAGNOSIS — Z01818 Encounter for other preprocedural examination: Secondary | ICD-10-CM

## 2019-04-17 DIAGNOSIS — G4733 Obstructive sleep apnea (adult) (pediatric): Secondary | ICD-10-CM | POA: Diagnosis present

## 2019-04-17 DIAGNOSIS — Z03818 Encounter for observation for suspected exposure to other biological agents ruled out: Secondary | ICD-10-CM | POA: Diagnosis not present

## 2019-04-17 DIAGNOSIS — M5432 Sciatica, left side: Secondary | ICD-10-CM | POA: Diagnosis present

## 2019-04-17 DIAGNOSIS — R52 Pain, unspecified: Secondary | ICD-10-CM | POA: Diagnosis not present

## 2019-04-17 LAB — CBC WITH DIFFERENTIAL/PLATELET
Abs Immature Granulocytes: 0.03 10*3/uL (ref 0.00–0.07)
Basophils Absolute: 0 10*3/uL (ref 0.0–0.1)
Basophils Relative: 0 %
Eosinophils Absolute: 0 10*3/uL (ref 0.0–0.5)
Eosinophils Relative: 0 %
HCT: 34.4 % — ABNORMAL LOW (ref 36.0–46.0)
Hemoglobin: 11 g/dL — ABNORMAL LOW (ref 12.0–15.0)
Immature Granulocytes: 0 %
Lymphocytes Relative: 16 %
Lymphs Abs: 1.2 10*3/uL (ref 0.7–4.0)
MCH: 27.1 pg (ref 26.0–34.0)
MCHC: 32 g/dL (ref 30.0–36.0)
MCV: 84.7 fL (ref 80.0–100.0)
Monocytes Absolute: 0.2 10*3/uL (ref 0.1–1.0)
Monocytes Relative: 3 %
Neutro Abs: 6.1 10*3/uL (ref 1.7–7.7)
Neutrophils Relative %: 81 %
Platelets: 219 10*3/uL (ref 150–400)
RBC: 4.06 MIL/uL (ref 3.87–5.11)
RDW: 15.2 % (ref 11.5–15.5)
WBC: 7.6 10*3/uL (ref 4.0–10.5)
nRBC: 0 % (ref 0.0–0.2)

## 2019-04-17 LAB — COMPREHENSIVE METABOLIC PANEL
ALT: 29 U/L (ref 0–44)
AST: 26 U/L (ref 15–41)
Albumin: 4 g/dL (ref 3.5–5.0)
Alkaline Phosphatase: 68 U/L (ref 38–126)
Anion gap: 10 (ref 5–15)
BUN: 19 mg/dL (ref 8–23)
CO2: 23 mmol/L (ref 22–32)
Calcium: 9.3 mg/dL (ref 8.9–10.3)
Chloride: 104 mmol/L (ref 98–111)
Creatinine, Ser: 0.97 mg/dL (ref 0.44–1.00)
GFR calc Af Amer: >60 mL/min (ref >60)
GFR calc non Af Amer: 59 mL/min — ABNORMAL LOW (ref >60)
Glucose, Bld: 108 mg/dL — ABNORMAL HIGH (ref 70–99)
Potassium: 3.5 mmol/L (ref 3.5–5.1)
Sodium: 137 mmol/L (ref 135–145)
Total Bilirubin: 0.8 mg/dL (ref 0.3–1.2)
Total Protein: 7.4 g/dL (ref 6.5–8.1)

## 2019-04-17 LAB — SARS CORONAVIRUS 2 BY RT PCR (HOSPITAL ORDER, PERFORMED IN ~~LOC~~ HOSPITAL LAB): SARS Coronavirus 2: NEGATIVE

## 2019-04-17 MED ORDER — HYDROMORPHONE HCL 1 MG/ML IJ SOLN
1.0000 mg | Freq: Once | INTRAMUSCULAR | Status: AC
  Administered 2019-04-17: 1 mg via INTRAVENOUS
  Filled 2019-04-17: qty 1

## 2019-04-17 MED ORDER — LORAZEPAM 2 MG/ML IJ SOLN
1.0000 mg | Freq: Once | INTRAMUSCULAR | Status: AC
  Administered 2019-04-17: 1 mg via INTRAVENOUS
  Filled 2019-04-17: qty 1

## 2019-04-17 MED ORDER — HYDROMORPHONE HCL 1 MG/ML IJ SOLN
1.0000 mg | Freq: Once | INTRAMUSCULAR | Status: AC
  Administered 2019-04-17: 1 mg via INTRAMUSCULAR
  Filled 2019-04-17: qty 1

## 2019-04-17 MED ORDER — ONDANSETRON HCL 4 MG/2ML IJ SOLN
4.0000 mg | Freq: Once | INTRAMUSCULAR | Status: AC
  Administered 2019-04-17: 4 mg via INTRAVENOUS
  Filled 2019-04-17: qty 2

## 2019-04-17 MED ORDER — DEXAMETHASONE SODIUM PHOSPHATE 10 MG/ML IJ SOLN
10.0000 mg | Freq: Once | INTRAMUSCULAR | Status: AC
  Administered 2019-04-17: 10 mg via INTRAMUSCULAR
  Filled 2019-04-17: qty 1

## 2019-04-17 MED ORDER — KETOROLAC TROMETHAMINE 60 MG/2ML IM SOLN
60.0000 mg | Freq: Once | INTRAMUSCULAR | Status: AC
  Administered 2019-04-17: 60 mg via INTRAMUSCULAR
  Filled 2019-04-17: qty 2

## 2019-04-17 NOTE — ED Provider Notes (Signed)
Patient has been seen after prior ED provider.  Patient with evidence of cord compression at T10 on MRI.  Dr. Christella Noa (Neurosurgery) aware of case and will evaluate in the ED.   Patient and family aware of plan of care.    Valarie Merino, MD 04/17/19 2050

## 2019-04-17 NOTE — H&P (Signed)
Rachel Crane is an 73 y.o. female.   Chief Complaint: back and left thigh pain x1 week, increasingly more severe. HPI: Unable to get out of bed this morning due to pain in the thoracolumbar region on the left side. Brought by ambulance this morning to [facility] ED she was given over the course of the day muscle relaxants, narcotics to decrease her discomfort. She eventually underwent an MRI of the Thoracic spine revealing stenosis at T10, due to ligamentous hypertrophy. Slightly improved since admission this morning. Will transfer to [facility].  Past Medical History:  Diagnosis Date  . Abnormal stress ECG 10/06/2018  . Aortic valve sclerosis 10/06/2018  . Arthritis   . Asthma   . Bilateral carotid bruits 10/06/2018  . Headache    H/O bad HA, since using CPAP- she has had improvement with that issue   . Hypertension   . Laboratory examination 10/06/2018  . OSA on CPAP    settting - 8, uses CPAP q night   . Shingles     Past Surgical History:  Procedure Laterality Date  . CHOLECYSTECTOMY  1998  . TOTAL KNEE ARTHROPLASTY Right 09/19/2017   Procedure: RIGHT TOTAL KNEE ARTHROPLASTY;  Surgeon: Leandrew Koyanagi, MD;  Location: [place];  Service: Orthopedics;  Laterality: Right;  . TUBAL LIGATION      Family History  Problem Relation Age of Onset  . Breast cancer Sister    Social History:  reports that she has never smoked. She has never used smokeless tobacco. She reports current alcohol use. She reports that she does not use drugs.  Allergies: No Known Allergies  (Not in a hospital admission)   Results for orders placed or performed during the hospital encounter of 04/17/19 (from the past 48 hour(s))  Comprehensive metabolic panel     Status: Abnormal   Collection Time: 04/17/19  1:43 PM  Result Value Ref Range   Sodium 137 135 - 145 mmol/L   Potassium 3.5 3.5 - 5.1 mmol/L   Chloride 104 98 - 111 mmol/L   CO2 23 22 - 32 mmol/L   Glucose, Bld 108 (H) 70 - 99 mg/dL   BUN 19 8 - 23 mg/dL    Creatinine, Ser 0.97 0.44 - 1.00 mg/dL   Calcium 9.3 8.9 - 10.3 mg/dL   Total Protein 7.4 6.5 - 8.1 g/dL   Albumin 4.0 3.5 - 5.0 g/dL   AST 26 15 - 41 U/L   ALT 29 0 - 44 U/L   Alkaline Phosphatase 68 38 - 126 U/L   Total Bilirubin 0.8 0.3 - 1.2 mg/dL   GFR calc non Af Amer 59 (L) >60 mL/min   GFR calc Af Amer >60 >60 mL/min   Anion gap 10 5 - 15    Comment: Performed at [facility], [place] [address], [place], [place] [address]  CBC with Differential     Status: Abnormal   Collection Time: 04/17/19  1:43 PM  Result Value Ref Range   WBC 7.6 4.0 - 10.5 K/uL   RBC 4.06 3.87 - 5.11 MIL/uL   Hemoglobin 11.0 (L) 12.0 - 15.0 g/dL   HCT 34.4 (L) 36.0 - 46.0 %   MCV 84.7 80.0 - 100.0 fL   MCH 27.1 26.0 - 34.0 pg   MCHC 32.0 30.0 - 36.0 g/dL   RDW 15.2 11.5 - 15.5 %   Platelets 219 150 - 400 K/uL   nRBC 0.0 0.0 - 0.2 %   Neutrophils Relative %  81 %   Neutro Abs 6.1 1.7 - 7.7 K/uL   Lymphocytes Relative 16 %   Lymphs Abs 1.2 0.7 - 4.0 K/uL   Monocytes Relative 3 %   Monocytes Absolute 0.2 0.1 - 1.0 K/uL   Eosinophils Relative 0 %   Eosinophils Absolute 0.0 0.0 - 0.5 K/uL   Basophils Relative 0 %   Basophils Absolute 0.0 0.0 - 0.1 K/uL   Immature Granulocytes 0 %   Abs Immature Granulocytes 0.03 0.00 - 0.07 K/uL    Comment: Performed at [facility], [place] [address], [place], [place] [address]   Mr Thoracic Spine Wo Contrast  Result Date: 04/17/2019 CLINICAL DATA:  Mid back/T-spine pain, initial exam. EXAM: MRI THORACIC SPINE WITHOUT CONTRAST TECHNIQUE: Multiplanar, multisequence MR imaging of the thoracic spine was performed. No intravenous contrast was administered. COMPARISON:  Lumbar spine MRI performed earlier the same day 04/17/2019 FINDINGS: The examination is significantly motion degraded, particularly the axial acquired imaging, limiting evaluation. Alignment: Exaggerated thoracic kyphosis. No significant spondylolisthesis. Vertebrae:  Vertebral body height is maintained. No suspicious osseous lesion is identified, although evaluation is significantly limited due to severe motion degradation of the sagittal acquired inversion recovery imaging. Cord: As described under the level by level findings section is below. Paraspinal and other soft tissues: Multiple T2 hyperintense lesions within the right kidney are incompletely assessed, but may reflect cysts. Disc levels: At T10-T11, there is a small disc bulge with superimposed small right center disc protrusion. Facet arthrosis. Prominent ligamentum flavum hypertrophy (particularly on the left). Resultant severe spinal canal stenosis with spinal cord flattening. Question subtle T2 hyperintensity within the spinal cord at this level which may reflect myelopathic change. No significant disc protrusion or spinal canal stenosis at the remaining thoracic levels. There are small multilevel disc bulges. Facet arthrosis/ligamentum flavum hypertrophy within the lower thoracic spine. Multilevel ventrolateral osteophytes. No more than mild neural foraminal narrowing within the thoracic spine These results were called by telephone at the time of interpretation on 04/17/2019 at 8:09 pm to provider [facility] , who verbally acknowledged these results. IMPRESSION: Significantly motion degraded examination. At T10-T11, small disc bulge with superimposed small right center disc protrusion. Facet arthrosis. Prominent ligamentum flavum hypertrophy (particularly on the left). Resultant severe spinal canal stenosis with spinal cord flattening. Question subtle T2 hyperintensity within the spinal cord at this level, which may reflect myelopathic. No significant spinal canal stenosis at the remaining levels. No more than mild neural foraminal narrowing within the thoracic spine. Electronically Signed   By: Kellie Simmering   On: 04/17/2019 20:11   Mr Lumbar Spine Wo Contrast  Result Date: 04/17/2019 CLINICAL DATA:  Low back  pain radiating into the left hip and knee 2 days. Left upper leg spasms. No known injury. EXAM: MRI LUMBAR SPINE WITHOUT CONTRAST TECHNIQUE: Multiplanar, multisequence MR imaging of the lumbar spine was performed. No intravenous contrast was administered. COMPARISON:  Plain films lumbar spine 01/27/2019. FINDINGS: Segmentation:  Standard. Alignment: Trace anterolisthesis L3 on L4 and 0.5 cm anterolisthesis L4 on L5 is due to facet arthropathy. Vertebrae:  No fracture, evidence of discitis, or bone lesion. Conus medullaris and cauda equina: Conus extends to the L1 level. Conus and cauda equina appear normal. Although it is at the superior margin of the scan, there appears to be edema within the distal cord at T10-11. Paraspinal and other soft tissues: Small T2 hyperintense lesions in the right kidney are most consistent with cysts. Disc levels: T10-11 and T11-12 are  imaged in the sagittal plane only. Disc bulge, facet degenerative disease and ligamentum flavum thickening are seen at T10-11. There is central canal stenosis and bilateral foraminal narrowing. T12-L1: Mild facet degenerative disease.  Otherwise negative. L1-2: There is ligamentum flavum thickening and a shallow disc bulge. Mild central canal narrowing is present. Foramina open. L2-3: Bulky ligamentum flavum thickening, moderate facet arthropathy and a shallow disc bulge. There is moderately severe to severe central canal stenosis and bilateral subarticular recess narrowing. L4-5: Advanced bilateral facet degenerative change and ligamentum flavum thickening. The disc is uncovered with a shallow bulge. Moderately severe to severe central canal stenosis and lateral recess narrowing. There is also mild to moderate bilateral foraminal narrowing. L4-5: The disc is uncovered without bulging. Advanced facet degenerative changes present. There is mild central canal and bilateral subarticular recess narrowing. Foramina are open. L5-S1: Facet degenerative change  and a shallow right paracentral protrusion. The central canal is open. Moderate to moderately severe foraminal narrowing is worse on the right. IMPRESSION: Possible signal abnormality in the distal cord at T10-11 where there appears to be marked central canal stenosis. This level is imaged in the sagittal plane only and is at the superior margin of the scan. Recommend thoracic spine MRI without contrast for further evaluation. 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 Moderately severe to severe central canal and bilateral lateral recess narrowing L2-3 and L3-4. Mild central canal and bilateral subarticular recess narrowing at L4-5. Moderate to moderately severe foraminal narrowing at L5-S1 is worse on the right. The central canal is open. Critical Value/emergent results were called by telephone at the time of interpretation on 04/17/2019 at 4:22 pm to providerDr. Francia Greaves, Who verbally acknowledged these results. Electronically Signed   By: Inge Rise M.D.   On: 04/17/2019 16:25    Review of Systems  Constitutional: Negative.   HENT: Negative.   Eyes: Negative.   Respiratory: Negative.   Cardiovascular: Negative.   Gastrointestinal: Negative.   Genitourinary: Negative.   Musculoskeletal: Positive for back pain.  Skin: Negative.   Neurological: Negative.   Endo/Heme/Allergies: Negative.   Psychiatric/Behavioral: Negative.     Blood pressure (!) 151/83, pulse 93, temperature 99.1 F (37.3 C), temperature source Oral, resp. rate 17, height 5\' 6"  (1.676 m), weight 93.4 kg, SpO2 95 %. Physical Exam  Constitutional: She is oriented to person, place, and time. She appears well-developed and well-nourished. She appears distressed.  HENT:  Head: Normocephalic and atraumatic.  Right Ear: External ear normal.  Left Ear: External ear normal.  Nose: Nose normal.  Mouth/Throat: Oropharynx is clear and moist.  Eyes: Pupils are equal, round, and reactive to light. Conjunctivae and  EOM are normal.  Neck: Normal range of motion. Neck supple.  Cardiovascular: Normal rate, regular rhythm, normal heart sounds and intact distal pulses.  Respiratory: Effort normal and breath sounds normal.  GI: Soft. Bowel sounds are normal.  Musculoskeletal: Normal range of motion.  Neurological: She is alert and oriented to person, place, and time. She has normal reflexes. She displays normal reflexes. No cranial nerve deficit. She exhibits normal muscle tone. Coordination normal.  propriocetion is normal in the lower extremities  Skin: Skin is warm and dry.  Psychiatric: She has a normal mood and affect. Her behavior is normal. Judgment and thought content normal.     Assessment/Plan Thoracic stenosis possibly causing pain. Strength is normal in the lower extremities, truly seems pain mediated. Will speak with the family tomorrow to discuss options.   Jaclyn Carew  L, MD 04/17/2019, 9:21 PM

## 2019-04-17 NOTE — ED Provider Notes (Signed)
[facility] DEPT Provider Note   CSN: [id number] Arrival date & time: 04/17/19  1056     History   Chief Complaint No chief complaint on file.   HPI Rachel Crane is a 73 y.o. female.     HPI Patient reports that she started getting just a very minor pain in her left lower back.  It started a couple days ago.  She reports that it bothered her a bit but she took some Aleve and it went away.  She reports that it came and went a couple at times but did not seem severe.  She reports this morning she let her dog out and went and laid back down.  She reports that she got pain then that started in the lower back and went all the way down to her knee and then to her foot.  She reports that it has both a spasming and sharp burning quality to it.  She reports it is severe and she has not really get any relief.  She reports she is never had anything like this before.  She denies any known history of back problems or sciatica.  She has not had fevers or chills.  She denies she did not have any abdominal pain.  She is had no difficulty with urination.  She denies of the leg feels weak per se but is so painful and it feels like it spasming that she cannot do anything at this time. Past Medical History:  Diagnosis Date  . Abnormal stress ECG 10/06/2018  . Aortic valve sclerosis 10/06/2018  . Arthritis   . Asthma   . Bilateral carotid bruits 10/06/2018  . Headache    H/O bad HA, since using CPAP- she has had improvement with that issue   . Hypertension   . Laboratory examination 10/06/2018  . OSA on CPAP    settting - 8, uses CPAP q night   . Shingles     Patient Active Problem List   Diagnosis Date Noted  . Seasonal allergies 12/04/2018  . Allergic rhinoconjunctivitis 10/10/2018  . Moderate persistent asthma with acute exacerbation 10/10/2018  . Pruritus 10/10/2018  . Aortic valve sclerosis 10/06/2018  . Bilateral carotid bruits 10/06/2018  . Abnormal stress ECG  10/06/2018  . Laboratory examination 10/06/2018  . Palpitations 10/06/2018  . Total knee replacement status 09/19/2017  . Sprain of anterior talofibular ligament of right ankle 12/13/2016    Past Surgical History:  Procedure Laterality Date  . CHOLECYSTECTOMY  1998  . TOTAL KNEE ARTHROPLASTY Right 09/19/2017   Procedure: RIGHT TOTAL KNEE ARTHROPLASTY;  Surgeon: Leandrew Koyanagi, MD;  Location: [facility];  Service: Orthopedics;  Laterality: Right;  . TUBAL LIGATION       OB History   No obstetric history on file.      Home Medications    Prior to Admission medications   Medication Sig Start Date End Date Taking? Authorizing Provider  aspirin EC 81 MG tablet Take 1 tablet (81 mg total) by mouth daily. 05/12/18  Yes Clent Demark, PA-C  budesonide-formoterol [facility]) 80-4.5 MCG/ACT inhaler 2 Puffs 2 times daily Patient taking differently: Inhale 2 puffs into the lungs daily as needed (shortness of breath).  01/15/19  Yes Padgett, Rae Halsted, MD  Cholecalciferol (VITAMIN D3) 25 MCG (1000 UT) CAPS Take by mouth.   Yes [provider]  fluticasone (FLONASE) 50 MCG/ACT nasal spray Place 2 sprays into both nostrils daily. 10/10/18  Yes Ambs, Kathrine Cords, FNP  HYDROCHLOROTHIAZIDE PO Take by mouth.   Yes [provider]  latanoprost (XALATAN) 0.005 % ophthalmic solution Place 1 drop into both eyes at bedtime.  12/05/17  Yes [provider]  loratadine (CLARITIN) 10 MG tablet Take 1 tablet (10 mg total) by mouth daily. Patient taking differently: Take 10 mg by mouth daily as needed for allergies or rhinitis.  01/15/19  Yes Padgett, Rae Halsted, MD  losartan (COZAAR) 100 MG tablet Take 1 tablet (100 mg total) by mouth daily. 12/04/18  Yes [name], NP  Multiple Vitamins-Minerals (WOMENS 50+ MULTI VITAMIN/MIN PO) Take by mouth daily.   Yes [provider]  potassium chloride SA (K-DUR,KLOR-CON) 20 MEQ tablet Take 20 mEq by mouth daily.  10/08/18  Yes  [provider]  pravastatin (PRAVACHOL) 10 MG tablet Take 10 mg by mouth at bedtime. 03/31/19  Yes [provider]  amitriptyline (ELAVIL) 10 MG tablet Take 1 tablet (10 mg total) by mouth at bedtime. Patient not taking: Reported on 04/17/2019 02/26/19   Raylene Everts, MD  azelastine (ASTELIN) 0.1 % nasal spray Place 2 sprays into both nostrils 2 (two) times daily. Patient not taking: Reported on 04/17/2019 10/10/18   Dara Hoyer, FNP  chlorthalidone (HYGROTON) 25 MG tablet Take 1 tablet (25 mg total) by mouth daily. Patient not taking: Reported on 04/17/2019 09/09/18   [name], NP  gabapentin (NEURONTIN) 300 MG capsule Take 1 capsule (300 mg total) by mouth 3 (three) times daily for 3 days. Patient not taking: Reported on 04/17/2019 03/30/19 04/02/19  Zigmund Gottron, NP  guaiFENesin (MUCINEX) 600 MG 12 hr tablet Take 1 tablet (600 mg total) by mouth 2 (two) times daily. Patient not taking: Reported on 04/17/2019 12/04/18   [name], NP  lidocaine (LIDODERM) 5 % Place 1 patch onto the skin daily. Remove & Discard patch within 12 hours or as directed by MD Patient not taking: Reported on 04/17/2019 02/26/19   Raylene Everts, MD  nitroGLYCERIN (NITROSTAT) 0.4 MG SL tablet Place 1 tablet (0.4 mg total) under the tongue every 5 (five) minutes as needed for chest pain. 05/12/18   Clent Demark, PA-C  omeprazole (PRILOSEC) 20 MG capsule Take 1 capsule (20 mg total) by mouth daily. Patient not taking: Reported on 04/17/2019 01/15/19   Kennith Gain, MD  triamcinolone cream (KENALOG) 0.1 % Apply topically 2 (two) times daily. to affected area Patient not taking: Reported on 04/17/2019 01/15/19   Kennith Gain, MD  cetirizine HCl (ZYRTEC) 1 MG/ML solution Take 5 mLs (5 mg total) by mouth daily for 10 days. 02/02/19 02/26/19  Wieters, Elesa Hacker, PA-C    Family History Family History  Problem Relation Age of Onset  . Breast cancer Sister      Social History Social History   Tobacco Use  . Smoking status: Never Smoker  . Smokeless tobacco: Never Used  Substance Use Topics  . Alcohol use: Yes    Comment: sometimes wine  . Drug use: No     Allergies   Patient has no known allergies.   Review of Systems Review of Systems 10 Systems reviewed and are negative for acute change except as noted in the HPI.   Physical Exam Updated Vital Signs BP (!) 154/65   Pulse 70   Temp 99.1 F (37.3 C) (Oral)   Resp (!) 24   Ht 5\' 6"  (1.676 m)   Wt 93.4 kg   SpO2 100%  BMI 33.25 kg/m   Physical Exam Constitutional:      Comments: Alert and nontoxic.  Patient is tearful and uncomfortable.  No respiratory distress.  Moderate obesity.  HENT:     Head: Normocephalic and atraumatic.  Eyes:     Extraocular Movements: Extraocular movements intact.  Neck:     Musculoskeletal: Neck supple.  Cardiovascular:     Rate and Rhythm: Normal rate and regular rhythm.  Pulmonary:     Effort: Pulmonary effort is normal.     Breath sounds: Normal breath sounds.  Abdominal:     General: There is no distension.     Palpations: Abdomen is soft.     Tenderness: There is no abdominal tenderness. There is no guarding.  Musculoskeletal:     Comments: No peripheral edema.  Calves are soft and nontender.  Patient reports increased back pain with range of motion of the left lower extremity.  Sensation intact to light touch.  She endorses pain to palpation over the lower SI region on the left.  Skin:    General: Skin is warm and dry.  Neurological:     General: No focal deficit present.     Mental Status: She is oriented to person, place, and time.     Coordination: Coordination normal.  Psychiatric:        Mood and Affect: Mood normal.      ED Treatments / Results  Labs (all labs ordered are listed, but only abnormal results are displayed) Labs Reviewed  COMPREHENSIVE METABOLIC PANEL - Abnormal; Notable for the following components:       Result Value   Glucose, Bld 108 (*)    GFR calc non Af Amer 59 (*)    All other components within normal limits  CBC WITH DIFFERENTIAL/PLATELET - Abnormal; Notable for the following components:   Hemoglobin 11.0 (*)    HCT 34.4 (*)    All other components within normal limits  URINALYSIS, ROUTINE W REFLEX MICROSCOPIC    EKG None  Radiology No results found.  Procedures Procedures (including critical care time)  Medications Ordered in ED Medications  HYDROmorphone (DILAUDID) injection 1 mg (1 mg Intramuscular Given 04/17/19 1216)  ketorolac (TORADOL) injection 60 mg (60 mg Intramuscular Given 04/17/19 1215)  dexamethasone (DECADRON) injection 10 mg (10 mg Intramuscular Given 04/17/19 1215)  HYDROmorphone (DILAUDID) injection 1 mg (1 mg Intravenous Given 04/17/19 1355)  LORazepam (ATIVAN) injection 1 mg (1 mg Intravenous Given 04/17/19 1543)     Initial Impression / Assessment and Plan / ED Course  I have reviewed the triage vital signs and the nursing notes.  Pertinent labs & imaging results that were available during my care of the patient were reviewed by me and considered in my medical decision making (see chart for details).       History of pain quality a very consistent with sciatica.  Patient did not achieve pain relief with IM Dilaudid 1 mg, Toradol and Decadron.  IV initiated.  Patient much improved with 1 mg IV of Dilaudid.  Patient does not have history of recurrent back problems or pain.  With severity of pain, will proceed with MRI.  Dr. Francia Greaves to review diagnostic results and make final disposition for patient pending MRI and response to treatment.   Final Clinical Impressions(s) / ED Diagnoses   Final diagnoses:  Sciatica of left side    ED Discharge Orders    None       Charlesetta Shanks, MD 04/17/19 [phone]

## 2019-04-17 NOTE — ED Notes (Signed)
Pt unable to walk or get into wheelchair, cannot obtain urine sample since pt is in hall bed.

## 2019-04-17 NOTE — ED Notes (Signed)
Patient transported to MRI 

## 2019-04-17 NOTE — ED Triage Notes (Signed)
Pt from home w/ c/o of L knee pain that radiates to hip and back.  Pt has had R knee replaced, and is due for a L knee replacement.  Pain has been going on for 2 days, and is having muscles spasms in her L thigh.

## 2019-04-18 ENCOUNTER — Inpatient Hospital Stay (HOSPITAL_COMMUNITY): Payer: Medicare Other

## 2019-04-18 DIAGNOSIS — M4804 Spinal stenosis, thoracic region: Secondary | ICD-10-CM | POA: Diagnosis present

## 2019-04-18 DIAGNOSIS — E669 Obesity, unspecified: Secondary | ICD-10-CM | POA: Diagnosis present

## 2019-04-18 DIAGNOSIS — Z803 Family history of malignant neoplasm of breast: Secondary | ICD-10-CM | POA: Diagnosis not present

## 2019-04-18 DIAGNOSIS — Z7951 Long term (current) use of inhaled steroids: Secondary | ICD-10-CM | POA: Diagnosis not present

## 2019-04-18 DIAGNOSIS — Z01818 Encounter for other preprocedural examination: Secondary | ICD-10-CM | POA: Diagnosis not present

## 2019-04-18 DIAGNOSIS — Z79899 Other long term (current) drug therapy: Secondary | ICD-10-CM | POA: Diagnosis not present

## 2019-04-18 DIAGNOSIS — Z6833 Body mass index (BMI) 33.0-33.9, adult: Secondary | ICD-10-CM | POA: Diagnosis not present

## 2019-04-18 DIAGNOSIS — G4733 Obstructive sleep apnea (adult) (pediatric): Secondary | ICD-10-CM | POA: Diagnosis present

## 2019-04-18 DIAGNOSIS — J45909 Unspecified asthma, uncomplicated: Secondary | ICD-10-CM | POA: Diagnosis present

## 2019-04-18 DIAGNOSIS — Z7982 Long term (current) use of aspirin: Secondary | ICD-10-CM | POA: Diagnosis not present

## 2019-04-18 DIAGNOSIS — Z01810 Encounter for preprocedural cardiovascular examination: Secondary | ICD-10-CM | POA: Diagnosis not present

## 2019-04-18 DIAGNOSIS — Z96651 Presence of right artificial knee joint: Secondary | ICD-10-CM | POA: Diagnosis present

## 2019-04-18 DIAGNOSIS — Z20828 Contact with and (suspected) exposure to other viral communicable diseases: Secondary | ICD-10-CM | POA: Diagnosis present

## 2019-04-18 DIAGNOSIS — I10 Essential (primary) hypertension: Secondary | ICD-10-CM | POA: Diagnosis present

## 2019-04-18 DIAGNOSIS — M5432 Sciatica, left side: Secondary | ICD-10-CM | POA: Diagnosis present

## 2019-04-18 LAB — CBC
HCT: 34.3 % — ABNORMAL LOW (ref 36.0–46.0)
Hemoglobin: 11.1 g/dL — ABNORMAL LOW (ref 12.0–15.0)
MCH: 27.7 pg (ref 26.0–34.0)
MCHC: 32.4 g/dL (ref 30.0–36.0)
MCV: 85.5 fL (ref 80.0–100.0)
Platelets: 220 10*3/uL (ref 150–400)
RBC: 4.01 MIL/uL (ref 3.87–5.11)
RDW: 15.5 % (ref 11.5–15.5)
WBC: 7.7 10*3/uL (ref 4.0–10.5)
nRBC: 0 % (ref 0.0–0.2)

## 2019-04-18 LAB — URINALYSIS, ROUTINE W REFLEX MICROSCOPIC
Bilirubin Urine: NEGATIVE
Glucose, UA: NEGATIVE mg/dL
Hgb urine dipstick: NEGATIVE
Ketones, ur: NEGATIVE mg/dL
Nitrite: NEGATIVE
Protein, ur: NEGATIVE mg/dL
Specific Gravity, Urine: 1.019 (ref 1.005–1.030)
pH: 6 (ref 5.0–8.0)

## 2019-04-18 LAB — PROTIME-INR
INR: 1.1 (ref 0.8–1.2)
Prothrombin Time: 14 seconds (ref 11.4–15.2)

## 2019-04-18 LAB — CREATININE, SERUM
Creatinine, Ser: 1.08 mg/dL — ABNORMAL HIGH (ref 0.44–1.00)
GFR calc Af Amer: 60 mL/min — ABNORMAL LOW (ref >60)
GFR calc non Af Amer: 52 mL/min — ABNORMAL LOW (ref >60)

## 2019-04-18 LAB — APTT: aPTT: 24 seconds (ref 24–36)

## 2019-04-18 MED ORDER — SODIUM CHLORIDE 0.9% FLUSH: 3.0000 mL | INTRAVENOUS | Status: DC | PRN

## 2019-04-18 MED ORDER — OXYCODONE HCL 5 MG PO TABS
5.0000 mg | ORAL_TABLET | ORAL | Status: DC | PRN
  Administered 2019-04-18 – 2019-04-19 (×3): 5 mg via ORAL
  Filled 2019-04-18 (×3): qty 1

## 2019-04-18 MED ORDER — MOMETASONE FURO-FORMOTEROL FUM 100-5 MCG/ACT IN AERO
2.0000 | INHALATION_SPRAY | Freq: Two times a day (BID) | RESPIRATORY_TRACT | Status: DC
  Administered 2019-04-18 – 2019-04-22 (×6): 2 via RESPIRATORY_TRACT
  Filled 2019-04-18: qty 8.8

## 2019-04-18 MED ORDER — ADULT MULTIVITAMIN W/MINERALS CH
1.0000 | ORAL_TABLET | Freq: Every day | ORAL | Status: DC
  Administered 2019-04-18 – 2019-04-22 (×5): 1 via ORAL
  Filled 2019-04-18 (×5): qty 1

## 2019-04-18 MED ORDER — ZOLPIDEM TARTRATE 5 MG PO TABS
5.0000 mg | ORAL_TABLET | Freq: Every evening | ORAL | Status: DC | PRN
  Administered 2019-04-18: 5 mg via ORAL
  Filled 2019-04-18: qty 1

## 2019-04-18 MED ORDER — NITROGLYCERIN 0.4 MG SL SUBL: 0.4000 mg | SUBLINGUAL_TABLET | SUBLINGUAL | Status: DC | PRN

## 2019-04-18 MED ORDER — LOSARTAN POTASSIUM 50 MG PO TABS
100.0000 mg | ORAL_TABLET | Freq: Every day | ORAL | Status: DC
  Administered 2019-04-18 – 2019-04-22 (×5): 100 mg via ORAL
  Filled 2019-04-18 (×6): qty 2

## 2019-04-18 MED ORDER — LATANOPROST 0.005 % OP SOLN
1.0000 [drp] | Freq: Every day | OPHTHALMIC | Status: DC
  Administered 2019-04-18 – 2019-04-21 (×4): 1 [drp] via OPHTHALMIC
  Filled 2019-04-18 (×2): qty 2.5

## 2019-04-18 MED ORDER — ACETAMINOPHEN 325 MG PO TABS
650.0000 mg | ORAL_TABLET | Freq: Once | ORAL | Status: DC
  Filled 2019-04-18: qty 2

## 2019-04-18 MED ORDER — SODIUM CHLORIDE 0.9% FLUSH
3.0000 mL | Freq: Two times a day (BID) | INTRAVENOUS | Status: DC
  Administered 2019-04-18 – 2019-04-19 (×3): 3 mL via INTRAVENOUS

## 2019-04-18 MED ORDER — PRAVASTATIN SODIUM 10 MG PO TABS
10.0000 mg | ORAL_TABLET | Freq: Every day | ORAL | Status: DC
  Administered 2019-04-18 – 2019-04-21 (×5): 10 mg via ORAL
  Filled 2019-04-18 (×5): qty 1

## 2019-04-18 MED ORDER — MAGNESIUM CITRATE PO SOLN: 1.0000 | Freq: Once | ORAL | Status: DC | PRN

## 2019-04-18 MED ORDER — ONDANSETRON HCL 4 MG/2ML IJ SOLN: 4.0000 mg | Freq: Four times a day (QID) | INTRAMUSCULAR | Status: DC | PRN

## 2019-04-18 MED ORDER — POTASSIUM CHLORIDE CRYS ER 20 MEQ PO TBCR
20.0000 meq | EXTENDED_RELEASE_TABLET | Freq: Every day | ORAL | Status: DC
  Administered 2019-04-18 – 2019-04-22 (×5): 20 meq via ORAL
  Filled 2019-04-18 (×5): qty 1

## 2019-04-18 MED ORDER — HEPARIN SODIUM (PORCINE) 5000 UNIT/ML IJ SOLN
5000.0000 [IU] | Freq: Three times a day (TID) | INTRAMUSCULAR | Status: DC
  Administered 2019-04-18 – 2019-04-22 (×11): 5000 [IU] via SUBCUTANEOUS
  Filled 2019-04-18 (×11): qty 1

## 2019-04-18 MED ORDER — ACETAMINOPHEN 325 MG PO TABS
650.0000 mg | ORAL_TABLET | Freq: Four times a day (QID) | ORAL | Status: DC | PRN
  Administered 2019-04-18 (×2): 650 mg via ORAL
  Filled 2019-04-18: qty 2

## 2019-04-18 MED ORDER — HYDROCHLOROTHIAZIDE 12.5 MG PO CAPS
12.5000 mg | ORAL_CAPSULE | Freq: Every day | ORAL | Status: DC
  Administered 2019-04-18 – 2019-04-22 (×5): 12.5 mg via ORAL
  Filled 2019-04-18 (×5): qty 1

## 2019-04-18 MED ORDER — FLUTICASONE PROPIONATE 50 MCG/ACT NA SUSP
2.0000 | Freq: Every day | NASAL | Status: DC
  Administered 2019-04-20 – 2019-04-22 (×3): 2 via NASAL
  Filled 2019-04-18: qty 16

## 2019-04-18 MED ORDER — ONDANSETRON HCL 4 MG PO TABS: 4.0000 mg | ORAL_TABLET | Freq: Four times a day (QID) | ORAL | Status: DC | PRN

## 2019-04-18 MED ORDER — DOCUSATE SODIUM 100 MG PO CAPS
100.0000 mg | ORAL_CAPSULE | Freq: Two times a day (BID) | ORAL | Status: DC
  Administered 2019-04-18 – 2019-04-22 (×8): 100 mg via ORAL
  Filled 2019-04-18 (×8): qty 1

## 2019-04-18 MED ORDER — ASPIRIN EC 81 MG PO TBEC
81.0000 mg | DELAYED_RELEASE_TABLET | Freq: Every day | ORAL | Status: DC
  Administered 2019-04-18 – 2019-04-22 (×5): 81 mg via ORAL
  Filled 2019-04-18 (×5): qty 1

## 2019-04-18 MED ORDER — ACETAMINOPHEN 650 MG RE SUPP: 650.0000 mg | Freq: Four times a day (QID) | RECTAL | Status: DC | PRN

## 2019-04-18 MED ORDER — POTASSIUM CHLORIDE IN NACL 20-0.9 MEQ/L-% IV SOLN
INTRAVENOUS | Status: DC
  Administered 2019-04-18: 02:00:00 via INTRAVENOUS
  Filled 2019-04-18 (×2): qty 1000

## 2019-04-18 MED ORDER — LORATADINE 10 MG PO TABS: 10.0000 mg | ORAL_TABLET | Freq: Every day | ORAL | Status: DC | PRN

## 2019-04-18 MED ORDER — OXYCODONE HCL ER 10 MG PO T12A
10.0000 mg | EXTENDED_RELEASE_TABLET | Freq: Two times a day (BID) | ORAL | Status: DC
  Administered 2019-04-18 – 2019-04-22 (×8): 10 mg via ORAL
  Filled 2019-04-18 (×8): qty 1

## 2019-04-18 MED ORDER — SODIUM CHLORIDE 0.9 % IV SOLN: 250.0000 mL | INTRAVENOUS | Status: DC | PRN

## 2019-04-18 NOTE — ED Notes (Signed)
Pt daughter at bedside

## 2019-04-18 NOTE — Progress Notes (Signed)
Patient ID: Rachel Crane, female   DOB: Jun 20, 1946, 73 y.o.   MRN: [id number] Alert and oriented x 4, speech is clear and fluent Moving lower extremities well Pain is much better. For now will pursue physical therapy next week

## 2019-04-18 NOTE — ED Notes (Signed)
This nurse called 3W at [facility] and informed them pt was on the way

## 2019-04-18 NOTE — ED Notes (Signed)
[facility] here to transfer pt to [facility] 3[facility] .

## 2019-04-18 NOTE — ED Notes (Signed)
[facility] called for transport 

## 2019-04-18 NOTE — ED Notes (Signed)
Pt moved to a hospital bed for comfort due to holding pt in ED.

## 2019-04-19 ENCOUNTER — Encounter (HOSPITAL_COMMUNITY): Payer: Self-pay | Admitting: *Deleted

## 2019-04-19 ENCOUNTER — Other Ambulatory Visit: Payer: Self-pay

## 2019-04-19 MED ORDER — GABAPENTIN 300 MG PO CAPS
300.0000 mg | ORAL_CAPSULE | Freq: Three times a day (TID) | ORAL | Status: DC
  Administered 2019-04-19 – 2019-04-22 (×8): 300 mg via ORAL
  Filled 2019-04-19 (×8): qty 1

## 2019-04-19 MED ORDER — OXYCODONE HCL 5 MG PO TABS
5.0000 mg | ORAL_TABLET | ORAL | Status: DC | PRN
  Administered 2019-04-19 – 2019-04-22 (×9): 10 mg via ORAL
  Filled 2019-04-19 (×10): qty 2

## 2019-04-19 MED ORDER — TIZANIDINE HCL 4 MG PO TABS
4.0000 mg | ORAL_TABLET | Freq: Three times a day (TID) | ORAL | Status: DC | PRN
  Administered 2019-04-19 – 2019-04-21 (×7): 4 mg via ORAL
  Filled 2019-04-19 (×7): qty 1

## 2019-04-19 NOTE — Evaluation (Signed)
Physical Therapy Evaluation Patient Details Name: Rachel Crane MRN: [id number] DOB: 04-26-1946 Today's Date: 04/19/2019   History of Present Illness  Pt is 73yo female with back & L thigh pain x1 week. PMH: aortic valve sclerosis, HTN, OSA on CPAP. PSH: R TKA on 09/2017.  Clinical Impression  Pt admitted with above. Pt with significant pain in L LE limiting transfer and ambulation ability. Pt ws indep and working at [employer] Pt with known T10 cord compression however N/S not leaning towards surgery. Pt with some inconsistent presentation in ability to perform transfers and amb amongst different staff members and daughter assisting her. Pt does live alone and at this time requires assistx2 for all mobility, therefore would recommend ST-SNF to achieve safe mod I level of function prior to returning home alone.    Follow Up Recommendations SNF;Supervision/Assistance - 24 hour    Equipment Recommendations  Rolling walker with 5" wheels;3in1 (PT)    Recommendations for Other Services       Precautions / Restrictions Precautions Precautions: Fall Precaution Comments: L LEpain Restrictions Weight Bearing Restrictions: No      Mobility  Bed Mobility Overal bed mobility: Needs Assistance Bed Mobility: Rolling;Sidelying to Sit Rolling: Supervision Sidelying to sit: Min assist       General bed mobility comments: increased time, minA to bring hips EOB,   Transfers Overall transfer level: Needs assistance Equipment used: Rolling walker (2 wheeled) Transfers: Sit to/from Stand Sit to Stand: Min assist;Mod assist;+2 physical assistance         General transfer comment: increased time, min/modA to power up and steady during transition of hands, decreased L LE weight shift  Ambulation/Gait Ambulation/Gait assistance: Min assist;+2 safety/equipment(2nd person for chair follow) Gait Distance (Feet): 10 Feet Assistive device: Rolling walker (2 wheeled) Gait Pattern/deviations:  Decreased stride length;Decreased step length - left;Decreased dorsiflexion - left;Decreased weight shift to left;Step-to pattern Gait velocity: slow Gait velocity interpretation: <1.8 ft/sec, indicate of risk for recurrent falls General Gait Details: pt with significant limp on the L LE, decreased L LE WBing tolerance, max encouragement, c/o L LE mm cramping  Stairs            Wheelchair Mobility    Modified Rankin (Stroke Patients Only)       Balance Overall balance assessment: Needs assistance Sitting-balance support: Feet supported;No upper extremity supported Sitting balance-Leahy Scale: Fair     Standing balance support: Bilateral upper extremity supported Standing balance-Leahy Scale: Zero Standing balance comment: dependent on RW                             Pertinent Vitals/Pain Pain Assessment: 0-10 Pain Score: 10-Worst pain ever Pain Location: L hip down L LE, especially posterior Pain Descriptors / Indicators: Burning;Constant;Cramping Pain Intervention(s): Heat applied    Home Living Family/patient expects to be discharged to:: Private residence Living Arrangements: Alone Available Help at Discharge: Family;Available PRN/intermittently Type of Home: House Home Access: Stairs to enter Entrance Stairs-Rails: None Entrance Stairs-Number of Steps: 1 Home Layout: One level(said she lives in 2 story but doesn't go upstairs) Home Equipment: None      Prior Function Level of Independence: Independent         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, 3x/wk     Journalist, newspaper   Dominant Hand: Right    Extremity/Trunk Assessment   Upper Extremity Assessment Upper Extremity Assessment: Overall WFL for tasks assessed    Lower  Extremity Assessment Lower Extremity Assessment: LLE deficits/detail LLE Deficits / Details: full ROM  however unable to resist MMT due to onset of pain    Cervical / Trunk Assessment Cervical / Trunk  Assessment: Normal  Communication   Communication: No difficulties  Cognition Arousal/Alertness: Awake/alert Behavior During Therapy: WFL for tasks assessed/performed Overall Cognitive Status: No family/caregiver present to determine baseline cognitive functioning                                 General Comments: pt in tears over pain, pt able to follow commands and such, per RN pt inconsistent with functional ability. Pt got up and put self back to bed but then wont roll or do anything in the bed to help herself      General Comments General comments (skin integrity, edema, etc.): VSS    Exercises     Assessment/Plan    PT Assessment Patient needs continued PT services  PT Problem List Decreased strength;Decreased activity tolerance;Decreased balance;Decreased mobility;Decreased knowledge of use of DME;Decreased safety awareness;Decreased cognition;Decreased coordination;Pain       PT Treatment Interventions DME instruction;Gait training;Stair training;Functional mobility training;Therapeutic activities;Therapeutic exercise;Balance training;Neuromuscular re-education;Cognitive remediation    PT Goals (Current goals can be found in the Care Plan section)  Acute Rehab PT Goals Patient Stated Goal: stop the pain,  PT Goal Formulation: With patient Time For Goal Achievement: 05/03/19 Potential to Achieve Goals: Fair    Frequency Min 4X/week   Barriers to discharge Decreased caregiver support lives alone    Co-evaluation               AM-PAC PT "6 Clicks" Mobility  Outcome Measure Help needed turning from your back to your side while in a flat bed without using bedrails?: A Little Help needed moving from lying on your back to sitting on the side of a flat bed without using bedrails?: A Little Help needed moving to and from a bed to a chair (including a wheelchair)?: A Lot Help needed standing up from a chair using your arms (e.g., wheelchair or bedside  chair)?: A Lot Help needed to walk in hospital room?: A Lot Help needed climbing 3-5 steps with a railing? : Total 6 Click Score: 13    End of Session Equipment Utilized During Treatment: Gait belt Activity Tolerance: Patient tolerated treatment well Patient left: in bed;with call bell/phone within reach;with bed alarm set Nurse Communication: Mobility status PT Visit Diagnosis: Unsteadiness on feet (R26.81);Muscle weakness (generalized) (M62.81);Difficulty in walking, not elsewhere classified (R26.2);Pain Pain - Right/Left: Left Pain - part of body: Leg;Hip    Time: [id number] PT Time Calculation (min) (ACUTE ONLY): 22 min   Charges:   PT Evaluation $PT Eval Moderate Complexity: 1 Mod          Kittie Plater, PT, DPT Acute Rehabilitation Services Pager #: [phone] Office #: [phone]   Berline Lopes 04/19/2019, 10:50 AM

## 2019-04-19 NOTE — Progress Notes (Signed)
Patient ID: Rachel Crane, female   DOB: 1945-09-28, 72 y.o.   MRN: [id number] BP 124/67 (BP Location: Right Arm)   Pulse 71   Temp 98.1 F (36.7 C) (Oral)   Resp 20   Ht 5\' 6"  (1.676 m)   Wt 93.4 kg   SpO2 98%   BMI 33.25 kg/m  Cramping in the left lower extremity Moving all extremities Will start tizanidine for her cramping Has been up with PT and daughter Continue conservAtive management

## 2019-04-20 NOTE — Plan of Care (Signed)
  Problem: Coping: Goal: Level of anxiety will decrease Outcome: Not Progressing   

## 2019-04-20 NOTE — Progress Notes (Signed)
Occupational Therapy Evaluation Patient Details Name: Rachel Crane MRN: [id number] DOB: 06/21/1946 Today's Date: 04/20/2019    History of Present Illness Pt is 73yo female with back & L thigh pain x1 week. PMH: aortic valve sclerosis, HTN, OSA on CPAP. PSH: R TKA on 09/2017.   Clinical Impression   PTA, pt was living at home alone, was independent with ADL/IADL and functional mobility and was working at [employer]. Pt currently requires modA for LB dressing and minA for functional mobility at RW level. Per chart and family report, family would prefer to work with therapy for conservative approach for MRI results of cord compression at T10. Feel pt would benefit from CIR level therapy to achieve modified independent level of function prior to returning home alone. Pt is aware of intensity of CIR level therapy and is agreeable. If pt is not appropriate for CIR, feel she would benefit from [facility] with her daughter and son-in-law. Daughter reports her husband, pt's son-in-law is home 24/7 and can assist pt. Pt will continue to benefit from skilled OT services to maximize safety and independence with ADL/IADL and functional mobility. Will continue to follow acutely and progress as tolerated.       Follow Up Recommendations  CIR;Home health OT;Supervision - Intermittent(with mobiltiy)    Equipment Recommendations  3 in 1 bedside commode    Recommendations for Other Services       Precautions / Restrictions Precautions Precautions: Fall Precaution Comments: L LEpain Restrictions Weight Bearing Restrictions: No      Mobility Bed Mobility Overal bed mobility: Needs Assistance Bed Mobility: Supine to Sit;Sit to Supine     Supine to sit: Min guard;HOB elevated Sit to supine: Min guard   General bed mobility comments: minguard for safety, increased time and effort and use of guard rails to progress trunk to upright position  Transfers Overall transfer level: Needs assistance Equipment  used: Rolling walker (2 wheeled) Transfers: Sit to/from Stand Sit to Stand: Min assist;Min guard         General transfer comment: minA for initial transfer from EOB, minguard for second, minA for transfer from commode    Balance Overall balance assessment: Needs assistance Sitting-balance support: Feet supported;No upper extremity supported Sitting balance-Leahy Scale: Fair     Standing balance support: Bilateral upper extremity supported Standing balance-Leahy Scale: Fair Standing balance comment: heavy reliance on BUE during mobility;can tolerate single UE support for short duration                           ADL either performed or assessed with clinical judgement   ADL Overall ADL's : Needs assistance/impaired Eating/Feeding: Set up;Sitting   Grooming: Minimal assistance;Standing   Upper Body Bathing: Set up;Sitting   Lower Body Bathing: Minimal assistance;Sit to/from stand   Upper Body Dressing : Set up;Sitting   Lower Body Dressing: Minimal assistance;Sit to/from stand   Toilet Transfer: Minimal assistance;Ambulation;RW Toilet Transfer Details (indicate cue type and reason): transferred from EOB to toilet Toileting- Clothing Manipulation and Hygiene: Minimal assistance;Sit to/from stand Toileting - Clothing Manipulation Details (indicate cue type and reason): minA to powerup      Functional mobility during ADLs: Minimal assistance;Rolling walker General ADL Comments: began education on use of adaptive equipment to assist with dressing     Vision Patient Visual Report: No change from baseline       Perception     Praxis      Pertinent Vitals/Pain Pain Assessment: 0-10  Pain Score: 3  Pain Location: L hip down L LE, especially posterior Pain Descriptors / Indicators: Burning;Constant;Cramping Pain Intervention(s): Limited activity within patient's tolerance;Monitored during session;Repositioned     Hand Dominance Right   Extremity/Trunk  Assessment Upper Extremity Assessment Upper Extremity Assessment: Overall WFL for tasks assessed   Lower Extremity Assessment Lower Extremity Assessment: LLE deficits/detail LLE Deficits / Details: limited by pain   Cervical / Trunk Assessment Cervical / Trunk Assessment: Normal   Communication Communication Communication: No difficulties   Cognition Arousal/Alertness: Awake/alert Behavior During Therapy: WFL for tasks assessed/performed Overall Cognitive Status: Within Functional Limits for tasks assessed                                     General Comments  VSS;educated pt on importance of working with therapy, educated her on level of participation required at [employer], pt verbalized she "wants to go there"    Exercises     Shoulder Instructions      Home Living Family/patient expects to be discharged to:: Private residence Living Arrangements: Alone Available Help at Discharge: Family;Available PRN/intermittently Type of Home: House Home Access: Stairs to enter Entrance Stairs-Number of Steps: 1 Entrance Stairs-Rails: None Home Layout: One level(said she lives in 2 story but doesn't go upstairs)     Bathroom Shower/Tub: Occupational psychologist: Handicapped height     Home Equipment: None   Additional Comments: Pt's daughter present and states pt could return home with her and her husband (who is home 24/7) they have 4 steps with railings and pt would live on the main level;      Prior Functioning/Environment Level of Independence: Independent        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, 3x/wk        OT Problem List: Decreased activity tolerance;Impaired balance (sitting and/or standing);Decreased safety awareness;Decreased knowledge of use of DME or AE;Decreased knowledge of precautions;Pain      OT Treatment/Interventions: Self-care/ADL training;Therapeutic exercise;DME and/or AE instruction;Therapeutic  activities;Patient/family education;Balance training    OT Goals(Current goals can be found in the care plan section) Acute Rehab OT Goals Patient Stated Goal: stop the pain,  OT Goal Formulation: With patient Time For Goal Achievement: 05/04/19 Potential to Achieve Goals: Good ADL Goals Pt Will Perform Grooming: with modified independence;standing Pt Will Perform Lower Body Dressing: with modified independence;sit to/from stand Pt Will Transfer to Toilet: with modified independence;ambulating Pt Will Perform Toileting - Clothing Manipulation and hygiene: with modified independence;sit to/from stand Pt Will Perform Tub/Shower Transfer: with modified independence  OT Frequency: Min 2X/week   Barriers to D/C:            Co-evaluation              AM-PAC OT "6 Clicks" Daily Activity     Outcome Measure Help from another person eating meals?: None Help from another person taking care of personal grooming?: A Little Help from another person toileting, which includes using toliet, bedpan, or urinal?: A Little Help from another person bathing (including washing, rinsing, drying)?: A Little Help from another person to put on and taking off regular upper body clothing?: A Little Help from another person to put on and taking off regular lower body clothing?: A Little 6 Click Score: 19   End of Session Equipment Utilized During Treatment: Gait belt;Rolling walker Nurse Communication: Mobility status  Activity Tolerance: Patient tolerated  treatment well Patient left: in bed;with call bell/phone within reach;with bed alarm set;with family/visitor present  OT Visit Diagnosis: Unsteadiness on feet (R26.81);Other abnormalities of gait and mobility (R26.89);Muscle weakness (generalized) (M62.81);Pain Pain - Right/Left: Left Pain - part of body: Leg                Time: 1410-1456 OT Time Calculation (min): 46 min Charges:  OT General Charges $OT Visit: 1 Visit OT Evaluation $OT Eval  Moderate Complexity: 1 Mod OT Treatments $Self Care/Home Management : 23-37 mins  Dorinda Hill OTR/L Acute Rehabilitation Services Office: [phone]   Wyn Forster 04/20/2019, 3:19 PM

## 2019-04-20 NOTE — Progress Notes (Signed)
Patient ID: Rachel Crane, female   DOB: [birth year], 73 y.o.   MRN: [id number] BP (!) 166/72 (BP Location: Left Arm)   Pulse 76   Temp 98.4 F (36.9 C) (Oral)   Resp 18   Ht 5\' 6"  (1.676 m)   Wt 93.4 kg   SpO2 97%   BMI 33.25 kg/m  Alert and oriented x 4, speech is clear and fluent Moving all extremities Fells better today, did work with the therapists Continue, discharge to home might be possible

## 2019-04-20 NOTE — TOC Initial Note (Signed)
Transition of Care [facility]) - Initial/Assessment Note    Patient Details  Name: Rachel Crane MRN: [id number] Date of Birth: 06-14-1946  Transition of [facility]) CM/SW Contact:    Gelene Mink, [place] Phone Number: 04/20/2019, 3:38 PM  Clinical Narrative:                  CSW met with the patient at bedside. The patient's daughter, and OT was finishing up with her, OT therapist Tera shared that the patient had much improvement today and they are recommending CIR. CSW shared the CIR process and stated that they would have to evaluate her and determine if she was able to handle the therapy. If the patient could not get into CIR then the patient's daughter would like her to come to her home. She works from home part of the time and her husband works from home full time. CSW provided a home health list for [place], [place].   The daughter is Veterinary surgeon. Her address is [place], [place]. She stated that she would look over the home health list and chose 2-3 agencies.   CSW will continue to follow.   Expected Discharge Plan: IP Rehab Facility(or [facility]) Barriers to Discharge: Continued Medical Work up, Other (comment)(CIR Placement)   Patient Goals and CMS Choice   CMS Medicare.gov Compare Post Acute Care list provided to:: Patient Choice offered to / list presented to : Patient  Expected Discharge Plan and Services Expected Discharge Plan: IP Rehab Facility(or [facility]) In-house Referral: Clinical Social Work Discharge Planning Services: NA Post Acute Care Choice: IP Rehab Living arrangements for the past 2 months: Single Family Home                                      Prior Living Arrangements/Services Living arrangements for the past 2 months: Single Family Home Lives with:: Self Patient language and need for interpreter reviewed:: No Do you feel safe going back to the place where you live?: Yes      Need for Family Participation in Patient Care: No (Comment) Care  giver support system in place?: Yes (comment)   Criminal Activity/Legal Involvement Pertinent to Current Situation/Hospitalization: No - Comment as needed  Activities of Daily Living   ADL Screening (condition at time of admission) Patient's cognitive ability adequate to safely complete daily activities?: Yes Is the patient deaf or have difficulty hearing?: No Does the patient have difficulty seeing, even when wearing glasses/contacts?: No Does the patient have difficulty concentrating, remembering, or making decisions?: No Patient able to express need for assistance with ADLs?: Yes Does the patient have difficulty dressing or bathing?: No Independently performs ADLs?: Yes (appropriate for developmental age) Does the patient have difficulty walking or climbing stairs?: Yes Weakness of Legs: Both Weakness of Arms/Hands: Right  Permission Sought/Granted Permission sought to share information with : Case Manager Permission granted to share information with : Yes, Verbal Permission Granted  Share Information with NAME: Sophia  Permission granted to share info w AGENCY: [facility] granted to share info w Relationship: Daughter     Emotional Assessment Appearance:: Appears stated age Attitude/Demeanor/Rapport: Engaged Affect (typically observed): Calm Orientation: : Oriented to Self, Oriented to Place, Oriented to  Time, Oriented to Situation Alcohol / Substance Use: Not Applicable Psych Involvement: No (comment)  Admission diagnosis:  Preoperative clearance [Z01.818] Acute midline low back pain without  sciatica [M54.5] Patient Active Problem List   Diagnosis Date Noted  . Thoracic spinal stenosis 04/18/2019  . Seasonal allergies 12/04/2018  . Allergic rhinoconjunctivitis 10/10/2018  . Moderate persistent asthma with acute exacerbation 10/10/2018  . Pruritus 10/10/2018  . Aortic valve sclerosis 10/06/2018  . Bilateral carotid bruits 10/06/2018  . Abnormal  stress ECG 10/06/2018  . Laboratory examination 10/06/2018  . Palpitations 10/06/2018  . Total knee replacement status 09/19/2017  . Sprain of anterior talofibular ligament of right ankle 12/13/2016   PCP:  [name], NP Pharmacy:   [facility] [place] [place] [address] Phone: [phone] Fax: [phone]     Social Determinants of Health (SDOH) Interventions    Readmission Risk Interventions No flowsheet data found.

## 2019-04-20 NOTE — Progress Notes (Signed)
Physical Therapy Treatment Patient Details Name: Ramanda Tuttle MRN: [id number] DOB: Apr 06, 1946 Today's Date: 04/20/2019    History of Present Illness Pt is 73yo female with back & L thigh pain x1 week. PMH: aortic valve sclerosis, HTN, OSA on CPAP. PSH: R TKA on 09/2017.    PT Comments    Pt performed gt training and functional mobility with improved ability and decreased assistance.  She si functioning at a supervision level with minimal instances of min guard when gt first started.  She was able to pick up object off floor from a standing position with no LOB.  Pt has support from her daughter and would benefit from HHPT to address balance and strength deficits in her LLE.  Will inform supervising PT of need for change in recommedations from snf at this time.     Follow Up Recommendations  Home health PT;Supervision/Assistance - 24 hour(I feel like she is too high level for CIR.)     Equipment Recommendations  Rolling walker with 5" wheels;3in1 (PT)    Recommendations for Other Services       Precautions / Restrictions Precautions Precautions: Fall Precaution Comments: L LEpain Restrictions Weight Bearing Restrictions: No    Mobility  Bed Mobility Overal bed mobility: Needs Assistance Bed Mobility: Rolling;Sidelying to Sit Rolling: Supervision Sidelying to sit: Supervision Supine to sit: Min guard;HOB elevated Sit to supine: Min guard   General bed mobility comments: No assistance needed to mobilize to edge of bed.  Transfers Overall transfer level: Needs assistance Equipment used: Rolling walker (2 wheeled) Transfers: Sit to/from Stand Sit to Stand: Supervision         General transfer comment: Cues for hand placement and supervision for safety.  Ambulation/Gait Ambulation/Gait assistance: Min guard Gait Distance (Feet): 170 Feet Assistive device: Rolling walker (2 wheeled) Gait Pattern/deviations: Decreased dorsiflexion - left;Narrow base of support;Trunk  flexed Gait velocity: slow   General Gait Details: Cues for L heel strike and knee extension, pt with slow buckling but able to correct.   Stairs             Wheelchair Mobility    Modified Rankin (Stroke Patients Only)       Balance Overall balance assessment: Needs assistance Sitting-balance support: Feet supported;No upper extremity supported Sitting balance-Leahy Scale: Fair     Standing balance support: Bilateral upper extremity supported Standing balance-Leahy Scale: Fair Standing balance comment: able to pick object off floor in bathroom from standing position with no LOB.                            Cognition Arousal/Alertness: Awake/alert Behavior During Therapy: WFL for tasks assessed/performed Overall Cognitive Status: Within Functional Limits for tasks assessed                                        Exercises      General Comments General comments (skin integrity, edema, etc.): VSS;educated pt on importance of working with therapy, educated her on level of participation required at [facility], pt verbalized she "wants to go there"      Pertinent Vitals/Pain Pain Assessment: 0-10 Pain Score: 3  Pain Location: L hip down L LE, especially posterior Pain Descriptors / Indicators: Burning;Constant;Cramping Pain Intervention(s): Monitored during session;Repositioned    Home Living Family/patient expects to be discharged to:: Private residence Living Arrangements: Alone Available Help at  Discharge: Family;Available PRN/intermittently Type of Home: House Home Access: Stairs to enter Entrance Stairs-Rails: None Home Layout: One level(said she lives in 2 story but doesn't go upstairs) Home Equipment: None Additional Comments: Pt's daughter present and states pt could return home with her and her husband (who is home 24/7) they have 4 steps with railings and pt would live on the main level;    Prior Function Level of Independence:  Independent      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, 3x/wk   PT Goals (current goals can now be found in the care plan section) Acute Rehab PT Goals Patient Stated Goal: stop the pain,  PT Goal Formulation: With patient Potential to Achieve Goals: Fair Progress towards PT goals: Progressing toward goals    Frequency    Min 4X/week      PT Plan Current plan remains appropriate    Co-evaluation              AM-PAC PT "6 Clicks" Mobility   Outcome Measure  Help needed turning from your back to your side while in a flat bed without using bedrails?: A Little Help needed moving from lying on your back to sitting on the side of a flat bed without using bedrails?: A Little Help needed moving to and from a bed to a chair (including a wheelchair)?: A Little Help needed standing up from a chair using your arms (e.g., wheelchair or bedside chair)?: A Little Help needed to walk in hospital room?: A Little Help needed climbing 3-5 steps with a railing? : A Little 6 Click Score: 18    End of Session Equipment Utilized During Treatment: Gait belt Activity Tolerance: Patient tolerated treatment well Patient left: in bed;with call bell/phone within reach;with bed alarm set Nurse Communication: Mobility status PT Visit Diagnosis: Unsteadiness on feet (R26.81);Muscle weakness (generalized) (M62.81);Difficulty in walking, not elsewhere classified (R26.2);Pain Pain - Right/Left: Left Pain - part of body: Leg;Hip     Time: [id number] PT Time Calculation (min) (ACUTE ONLY): 21 min  Charges:  $Gait Training: 8-22 mins                     Governor Rooks, PTA Acute Rehabilitation Services Pager [phone] Office [phone]     Beverlyann Broxterman Eli Hose 04/20/2019, 5:49 PM

## 2019-04-21 MED ORDER — POLYETHYLENE GLYCOL 3350 17 G PO PACK
17.0000 g | PACK | Freq: Once | ORAL | Status: DC
  Filled 2019-04-21: qty 1

## 2019-04-21 NOTE — Discharge Summary (Signed)
Physician Discharge Summary  Patient ID: Rachel Crane MRN: [id number] DOB/AGE: 08/09/1945 73 y.o.  Admit date: 04/17/2019 Discharge date: 04/21/2019  Admission Diagnoses:thoracic spinal stenosis  Discharge Diagnoses:  Active Problems:   Thoracic spinal stenosis   Discharged Condition: good  Hospital Course: Rachel Crane was admitted secondary to severe back pain from [facility] ED. After discussion with her children we decided to opt for conservative management at this time. She is ambulating at discharge, voiding, and tolerating a regular diet. She has normal strength in the lower extremities.   Treatments: therapies: PT and OT  Discharge Exam: Blood pressure 133/60, pulse 78, temperature 98.7 F (37.1 C), temperature source Oral, resp. rate 20, height 5\' 6"  (1.676 m), weight 93.4 kg, SpO2 99 %. as above  Disposition: Discharge disposition: 01-Home or Self Care      * No surgery found *  Allergies as of 04/21/2019   No Known Allergies     Medication List    STOP taking these medications   amitriptyline 10 MG tablet Commonly known as: ELAVIL   azelastine 0.1 % nasal spray Commonly known as: ASTELIN   chlorthalidone 25 MG tablet Commonly known as: HYGROTON   gabapentin 300 MG capsule Commonly known as: Neurontin   guaiFENesin 600 MG 12 hr tablet Commonly known as: Mucinex   lidocaine 5 % Commonly known as: Lidoderm   omeprazole 20 MG capsule Commonly known as: PRILOSEC   triamcinolone cream 0.1 % Commonly known as: KENALOG     TAKE these medications   aspirin EC 81 MG tablet Take 1 tablet (81 mg total) by mouth daily.   budesonide-formoterol 80-4.5 MCG/ACT inhaler Commonly known as: SYMBICORT 2 Puffs 2 times daily What changed:   how much to take  how to take this  when to take this  reasons to take this  additional instructions   fluticasone 50 MCG/ACT nasal spray Commonly known as: Flonase Place 2 sprays into both nostrils daily.    HYDROCHLOROTHIAZIDE PO Take by mouth.   latanoprost 0.005 % ophthalmic solution Commonly known as: XALATAN Place 1 drop into both eyes at bedtime.   loratadine 10 MG tablet Commonly known as: CLARITIN Take 1 tablet (10 mg total) by mouth daily. What changed:   when to take this  reasons to take this   losartan 100 MG tablet Commonly known as: COZAAR Take 1 tablet (100 mg total) by mouth daily.   nitroGLYCERIN 0.4 MG SL tablet Commonly known as: NITROSTAT Place 1 tablet (0.4 mg total) under the tongue every 5 (five) minutes as needed for chest pain.   potassium chloride SA 20 MEQ tablet Commonly known as: KLOR-CON Take 20 mEq by mouth daily.   pravastatin 10 MG tablet Commonly known as: PRAVACHOL Take 10 mg by mouth at bedtime.   Vitamin D3 25 MCG (1000 UT) Caps Take by mouth.   WOMENS 50+ MULTI VITAMIN/MIN PO Take by mouth daily.            Durable Medical Equipment  (From admission, onward)         Start     Ordered   04/19/19 1110  For home use only DME 3 n 1  Once     04/19/19 1110   04/19/19 1110  For home use only DME Walker rolling  Once    Question:  Patient needs a walker to treat with the following condition  Answer:  Thoracic stenosis   04/19/19 1110  Signed: Jacqueline Spofford L 04/21/2019, 7:29 PM

## 2019-04-21 NOTE — Discharge Instructions (Signed)
You may walk as much as you like. Try not to be sedentary.  Return to a regular diet.  Call if your temperature is greater than 101.5, have trouble breathing

## 2019-04-21 NOTE — Progress Notes (Signed)
Patient ID: Rachel Crane, female   DOB: [birth year], 73 y.o.   MRN: [id number] BP 133/60 (BP Location: Left Arm)   Pulse 78   Temp 98.7 F (37.1 C) (Oral)   Resp 20   Ht 5\' 6"  (1.676 m)   Wt 93.4 kg   SpO2 99%   BMI 33.25 kg/m  Will discharge tomorrow Doing better Home health will be engaged

## 2019-04-21 NOTE — Progress Notes (Signed)
Physical Therapy Treatment Patient Details Name: Rachel Crane MRN: [id number] DOB: August 28, 1945 Today's Date: 04/21/2019    History of Present Illness Pt is 73yo female with back & L thigh pain x1 week. PMH: aortic valve sclerosis, HTN, OSA on CPAP. PSH: R TKA on 09/2017.    PT Comments    Pt performed gt training and functional mobility during session.  Pt is slow and guarded during session due to pain.  Pt with the most pain during L hip extension.  Based on progression continue to recommend HHPT and possible OP PT for pain relief.  Informed case management to discuss with patient.     Follow Up Recommendations  Home health PT;Supervision/Assistance - 24 hour     Equipment Recommendations  Rolling walker with 5" wheels;3in1 (PT)    Recommendations for Other Services       Precautions / Restrictions Precautions Precautions: Fall Precaution Comments: L LEpain Restrictions Weight Bearing Restrictions: No    Mobility  Bed Mobility Overal bed mobility: Needs Assistance Bed Mobility: Rolling;Sidelying to Sit Rolling: Supervision Sidelying to sit: Supervision       General bed mobility comments: No assistance needed to mobilize to bed but patient is slow and guarded due to pain.  Transfers Overall transfer level: Needs assistance Equipment used: Rolling walker (2 wheeled) Transfers: Sit to/from Stand Sit to Stand: Supervision         General transfer comment: Cues for hand placement and supervision for safety.  Ambulation/Gait Ambulation/Gait assistance: Supervision;Min guard Gait Distance (Feet): 150 Feet Assistive device: Rolling walker (2 wheeled) Gait Pattern/deviations: Decreased dorsiflexion - left;Narrow base of support;Trunk flexed;Antalgic Gait velocity: slow   General Gait Details: Pt with more antalgic pattern but continues to move with in functional limits.  Pt required cues for upper trunk control and to utilize LEs more to avoid pressure on B  shoulders.   Stairs             Wheelchair Mobility    Modified Rankin (Stroke Patients Only)       Balance Overall balance assessment: Needs assistance Sitting-balance support: Feet supported;No upper extremity supported Sitting balance-Leahy Scale: Fair     Standing balance support: Bilateral upper extremity supported Standing balance-Leahy Scale: Fair Standing balance comment: able to pick object off floor in bathroom from standing position with no LOB.                            Cognition Arousal/Alertness: Awake/alert Behavior During Therapy: WFL for tasks assessed/performed Overall Cognitive Status: Within Functional Limits for tasks assessed                                 General Comments: Pt with grimmacing during session.      Exercises Total Joint Exercises Ankle Circles/Pumps: AROM;Both;10 reps;Supine Quad Sets: AROM;Both;10 reps;Supine Heel Slides: AROM;Left;10 reps;Supine Other Exercises Other Exercises: neural glides on R sidelying to L LE.  more pain with hip extension noted.    General Comments        Pertinent Vitals/Pain Pain Assessment: 0-10 Pain Score: 8  Pain Location: L thigh/hip and glut-worse with hip extension Pain Descriptors / Indicators: Burning;Constant;Cramping Pain Intervention(s): Monitored during session;Repositioned    Home Living                      Prior Function  PT Goals (current goals can now be found in the care plan section) Acute Rehab PT Goals Patient Stated Goal: to get better Potential to Achieve Goals: Fair Progress towards PT goals: Progressing toward goals    Frequency    Min 4X/week      PT Plan Current plan remains appropriate    Co-evaluation              AM-PAC PT "6 Clicks" Mobility   Outcome Measure  Help needed turning from your back to your side while in a flat bed without using bedrails?: A Little Help needed moving from  lying on your back to sitting on the side of a flat bed without using bedrails?: A Little Help needed moving to and from a bed to a chair (including a wheelchair)?: A Little Help needed standing up from a chair using your arms (e.g., wheelchair or bedside chair)?: A Little Help needed to walk in hospital room?: A Little Help needed climbing 3-5 steps with a railing? : A Little 6 Click Score: 18    End of Session Equipment Utilized During Treatment: Gait belt Activity Tolerance: Patient tolerated treatment well Patient left: in bed;with call bell/phone within reach;with bed alarm set Nurse Communication: Mobility status PT Visit Diagnosis: Unsteadiness on feet (R26.81);Muscle weakness (generalized) (M62.81);Difficulty in walking, not elsewhere classified (R26.2);Pain Pain - Right/Left: Left Pain - part of body: Leg;Hip     Time: 1227-1250 PT Time Calculation (min) (ACUTE ONLY): 23 min  Charges:  $Gait Training: 8-22 mins $Therapeutic Exercise: 8-22 mins                     Governor Rooks, PTA Acute Rehabilitation Services Pager [phone] Office [phone]     Natahlia Hoggard Eli Hose 04/21/2019, 3:17 PM

## 2019-04-21 NOTE — Plan of Care (Signed)
  Problem: Nutrition: Goal: Adequate nutrition will be maintained Outcome: Progressing   

## 2019-04-21 NOTE — Care Management Important Message (Signed)
Important Message  Patient Details  Name: Rachel Crane MRN: [id number] Date of Birth: 1945/08/28   Medicare Important Message Given:  Yes     Orbie Pyo 04/21/2019, 3:36 PM

## 2019-04-21 NOTE — TOC Progression Note (Signed)
Transition of Care [facility]) - Progression Note    Patient Details  Name: Rachel Crane MRN: [id number] Date of Birth: March 31, 1946  Transition of Care [facility]) CM/SW Contact  Pollie Friar, RN Phone Number: 04/21/2019, 1:54 PM  Clinical Narrative:    Daughter left a note that they would like to use [facility] for patients [facility] needs. CM spoke to Hall with [facility] and she accepted the referral.  Ordered DME to be delivered to the room. Daughter going to take the patient to her home at: [address] in [place], [place] Daughter contact: [phone] Son in law: [phone] Family to provide needed transport when d/ced.    Expected Discharge Plan: [place] Barriers to Discharge: Continued Medical Work up  Expected Discharge Plan and Services Expected Discharge Plan: [place] In-house Referral: Clinical Social Work Discharge Planning Services: CM Consult Post Acute Care Choice: Durable Medical Equipment, [place] arrangements for the past 2 months: Single Family Home                 DME Arranged: 3-N-1, Walker rolling DME Agency: [employer] Date DME Agency Contacted: 04/21/19   Representative spoke with at DME Agency: [facility] HH Arranged: PT, OT [place] Agency: [facility] [employer] Date [place]: 04/21/19   Representative spoke with at [place]: [place] ([place]) Interventions    Readmission Risk Interventions No flowsheet data found.

## 2019-04-22 NOTE — Progress Notes (Signed)
Patient discharged home, daughter providing transportation. Educated patient and daughter on discharge packet information, questions answered. Belongings returned to patient. Patient requesting pain medication to leave with. Explained to patient and daughter that they could wait and see if Dr. Christella Noa responds and gives a prescription or call [facility] once discharged. Daughter and patient decided to leave. Patient crying as I wheeled her to her daughter's car, stating that she was in pain.  [place] Gelin A Renita Brocks

## 2019-04-22 NOTE — Progress Notes (Signed)
Occupational Therapy Treatment Patient Details Name: Rachel Crane MRN: [id number] DOB: 10/10/1945 Today's Date: 04/22/2019    History of present illness Pt is 73yo female with back & L thigh pain x1 week. PMH: aortic valve sclerosis, HTN, OSA on CPAP. PSH: R TKA on 09/2017.   OT comments  Pt up in recliner upon OT arrival reporting 10/10 pain declining functional mobility or ADLs. Pt agreeable to LB AE education. Education/ demonstration on all LB AE for bathing/ dressing. Pt reports she wants to get her own reacher at [employer] and was able to don socks from recliner with MOD A for sequencing of task. Education/ demonstration on uses of 3n1 with pt verbalizing understanding. Pt likely to DC home today with her daughter. Will continue to follow acutely for OT needs.   Follow Up Recommendations  CIR;Home health OT;Supervision - Intermittent    Equipment Recommendations  3 in 1 bedside commode    Recommendations for Other Services      Precautions / Restrictions Precautions Precautions: Fall Precaution Comments: L LEpain Restrictions Weight Bearing Restrictions: No       Mobility Bed Mobility Overal bed mobility: Needs Assistance Bed Mobility: Rolling;Sidelying to Sit Rolling: Supervision Sidelying to sit: Min assist       General bed mobility comments: up in recliner upon arrival  Transfers Overall transfer level: Needs assistance Equipment used: Rolling walker (2 wheeled) Transfers: Sit to/from Stand Sit to Stand: Min guard         General transfer comment: declined transfer d/t 10/10 pain    Balance Overall balance assessment: Needs assistance Sitting-balance support: Feet supported;No upper extremity supported Sitting balance-Leahy Scale: Fair Sitting balance - Comments: able to static sit with no LOB     Standing balance-Leahy Scale: Fair Standing balance comment: able to pick object off floor in bathroom from standing position with no LOB.                            ADL either performed or assessed with clinical judgement   ADL Overall ADL's : Needs assistance/impaired       Grooming Details (indicate cue type and reason): education on using 3n1 as seat in front of sink for EC strategy during seated grooming       Lower Body Bathing Details (indicate cue type and reason): education on LB AE for bathing; pt verbalized understanding     Lower Body Dressing: Moderate assistance;Sit to/from stand;With adaptive equipment Lower Body Dressing Details (indicate cue type and reason): education on all LB dressing AE; pt able to don socks with MOD A with cues for sequencing of task sitting in recliner   Toilet Transfer Details (indicate cue type and reason): declined transfer           General ADL Comments: session focus on LB AE d/t persistent back pain limiting pt from reaching feet; education/ demonstration on all LB AE with pt verbalizing understanding     Vision Patient Visual Report: No change from baseline     Perception     Praxis      Cognition Arousal/Alertness: Awake/alert Behavior During Therapy: WFL for tasks assessed/performed Overall Cognitive Status: Within Functional Limits for tasks assessed                                 General Comments: Pt grimmacing/ moaning  in just sitting recliner with no  movment during session. pt perseverating on pain and worried about not going home with correct muscle relaxer        Exercises Total Joint Exercises Heel Slides: AROM;Left;10 reps;Supine   Shoulder Instructions       General Comments      Pertinent Vitals/ Pain       Pain Assessment: 0-10 Pain Score: 10-Worst pain ever Pain Location: from L knee to L hip Pain Descriptors / Indicators: Burning;Constant;Cramping;Discomfort;Moaning Pain Intervention(s): Limited activity within patient's tolerance;Monitored during session;Repositioned  Home Living                                           Prior Functioning/Environment              Frequency  Min 2X/week        Progress Toward Goals  OT Goals(current goals can now be found in the care plan section)  Progress towards OT goals: Progressing toward goals  Acute Rehab OT Goals Patient Stated Goal: to get better OT Goal Formulation: With patient Time For Goal Achievement: 05/04/19 Potential to Achieve Goals: Good  Plan Discharge plan remains appropriate    Co-evaluation                 AM-PAC OT "6 Clicks" Daily Activity     Outcome Measure   Help from another person eating meals?: None Help from another person taking care of personal grooming?: A Little Help from another person toileting, which includes using toliet, bedpan, or urinal?: A Little Help from another person bathing (including washing, rinsing, drying)?: A Little Help from another person to put on and taking off regular upper body clothing?: A Little Help from another person to put on and taking off regular lower body clothing?: A Little 6 Click Score: 19    End of Session    OT Visit Diagnosis: Unsteadiness on feet (R26.81);Other abnormalities of gait and mobility (R26.89);Muscle weakness (generalized) (M62.81);Pain   Activity Tolerance Patient limited by pain   Patient Left with call bell/phone within reach;in chair   Nurse Communication Mobility status        Time: [id number] OT Time Calculation (min): 19 min  Charges: OT General Charges $OT Visit: 1 Visit OT Treatments $Self Care/Home Management : 8-22 mins [place], [place] [phone] [place] 04/22/2019, 12:06 PM

## 2019-04-22 NOTE — Progress Notes (Signed)
Patient has concerns about discharging home without pain medications and wants to speak with a orthopedic surgeon with concerns of her right knee. Dr. Christella Noa notified, awaiting a call back. Daughter has been called for transport home. Nurse will continue to monitor. [place]

## 2019-04-22 NOTE — TOC Transition Note (Signed)
Transition of Care [facility]) - CM/SW Discharge Note   Patient Details  Name: Sharell Fetherolf MRN: [id number] Date of Birth: [birth year]  Transition of Care [facility]) CM/SW Contact:  Pollie Friar, RN Phone Number: 04/22/2019, 11:04 AM   Clinical Narrative:    Pt discharging home today with [facility] services through [facility]. Tiffany with [facility] aware of d/c.  DME for home is at the bedside.  Patient called daughter and she is aware of d/c and will provide transportation to her home.  Pt with concerns about her d/c medications. Bedside RN to f/u with MD.    Final next level of care: Home w Home Health Services Barriers to Discharge: No Barriers Identified   Patient Goals and CMS Choice   CMS Medicare.gov Compare Post Acute Care list provided to:: Patient Choice offered to / list presented to : Patient, Adult Children  Discharge Placement                       Discharge Plan and Services In-house Referral: Clinical Social Work Discharge Planning Services: CM Consult Post Acute Care Choice: Durable Medical Equipment, Home Health          DME Arranged: 3-N-1, Walker rolling DME Agency: [employer] Date DME Agency Contacted: 04/21/19   Representative spoke with at DME Agency: [place]: PT, OT [place] Agency: [facility] [employer] Date [place]: 04/21/19   Representative spoke with at [place]: Tiffany aware of d/c home today  Social Determinants of Health (SDOH) Interventions     Readmission Risk Interventions No flowsheet data found.

## 2019-04-22 NOTE — Progress Notes (Signed)
Physical Therapy Treatment Patient Details Name: Rachel Crane MRN: [id number] DOB: 1946/01/03 Today's Date: 04/22/2019    History of Present Illness Pt is 73yo female with back & L thigh pain x1 week. PMH: aortic valve sclerosis, HTN, OSA on CPAP. PSH: R TKA on 09/2017.    PT Comments    Pt performed gt training and functional mobility with grimmacing and guarding throughout.  She appears to present with malingering behavior as she has been given d/c instructions today and she reports she is not ready to d/c home.  Pt is functioning between min-min guard assistance today and will be able to d/c at the level with assistance of her daughter.  She performed stair training for safe entry into her daughters home.  Plan remains appropriate for d/c today.    Follow Up Recommendations  Home health PT;Supervision/Assistance - 24 hour     Equipment Recommendations  Rolling walker with 5" wheels;3in1 (PT)    Recommendations for Other Services       Precautions / Restrictions Precautions Precautions: Fall Precaution Comments: L LEpain Restrictions Weight Bearing Restrictions: No    Mobility  Bed Mobility Overal bed mobility: Needs Assistance Bed Mobility: Rolling;Sidelying to Sit Rolling: Supervision Sidelying to sit: Min assist       General bed mobility comments: Pt utilized PTA as a railing to pull up into sitting edge of bed.  She is more guarded due to pain.  Transfers Overall transfer level: Needs assistance Equipment used: Rolling walker (2 wheeled) Transfers: Sit to/from Stand Sit to Stand: Min guard         General transfer comment: Multiple attempts to come to standing due to pain but no physical assistance required.  Ambulation/Gait Ambulation/Gait assistance: Supervision;Min guard Gait Distance (Feet): 170 Feet Assistive device: Rolling walker (2 wheeled) Gait Pattern/deviations: Decreased dorsiflexion - left;Narrow base of support;Trunk flexed;Antalgic Gait  velocity: slow   General Gait Details: Pt required cues for upper trunk control and forward gaze.  Pt required cues to use B UE in L stance phase to decrease pain with weight shifting.  x1 buckle of L knee noted in stance phase but able to correct without assistance.   Stairs Stairs: Yes Stairs assistance: Min guard Stair Management: Two rails;Step to pattern;Forwards Number of Stairs: 2 General stair comments: Cues for sequencing and to maintain step to pattern.   Wheelchair Mobility    Modified Rankin (Stroke Patients Only)       Balance Overall balance assessment: Needs assistance Sitting-balance support: Feet supported;No upper extremity supported Sitting balance-Leahy Scale: Fair       Standing balance-Leahy Scale: Fair Standing balance comment: able to pick object off floor in bathroom from standing position with no LOB.                            Cognition Arousal/Alertness: Awake/alert Behavior During Therapy: WFL for tasks assessed/performed Overall Cognitive Status: Within Functional Limits for tasks assessed                                 General Comments: Pt with grimmacing during session.  pt emotional about d/c home without her muscler relaxer and her pain meds.  Nurse to check on this.      Exercises Total Joint Exercises Heel Slides: AROM;Left;10 reps;Supine    General Comments        Pertinent Vitals/Pain Pain Assessment: 0-10  Pain Score: 8  Pain Location: L thigh/hip and glut-worse with hip extension Pain Descriptors / Indicators: Burning;Constant;Cramping Pain Intervention(s): Monitored during session;Repositioned    Home Living                      Prior Function            PT Goals (current goals can now be found in the care plan section) Acute Rehab PT Goals Patient Stated Goal: to get better Potential to Achieve Goals: Fair Progress towards PT goals: Progressing toward goals     Frequency    Min 4X/week      PT Plan Current plan remains appropriate    Co-evaluation              AM-PAC PT "6 Clicks" Mobility   Outcome Measure  Help needed turning from your back to your side while in a flat bed without using bedrails?: A Little Help needed moving from lying on your back to sitting on the side of a flat bed without using bedrails?: A Little Help needed moving to and from a bed to a chair (including a wheelchair)?: A Little Help needed standing up from a chair using your arms (e.g., wheelchair or bedside chair)?: A Little Help needed to walk in hospital room?: A Little Help needed climbing 3-5 steps with a railing? : A Little 6 Click Score: 18    End of Session Equipment Utilized During Treatment: Gait belt Activity Tolerance: Patient tolerated treatment well Patient left: in bed;with call bell/phone within reach;with bed alarm set Nurse Communication: Mobility status PT Visit Diagnosis: Unsteadiness on feet (R26.81);Muscle weakness (generalized) (M62.81);Difficulty in walking, not elsewhere classified (R26.2);Pain Pain - Right/Left: Left Pain - part of body: Leg;Hip     Time: [id number] PT Time Calculation (min) (ACUTE ONLY): 34 min  Charges:  $Gait Training: 8-22 mins $Therapeutic Activity: 8-22 mins                     Governor Rooks, PTA Acute Rehabilitation Services Pager [phone] Office [phone]     Rachel Crane 04/22/2019, 11:33 AM

## 2019-04-22 NOTE — Progress Notes (Signed)
Left Rachel message for Dr. Christella Noa stating that daughter is here to take [facility] home and would like her to be sent home with Rachel medication for pain.  Rachel Crane Rachel Crane

## 2019-04-22 NOTE — Progress Notes (Deleted)
Patient discharged home, transportation provided by daughter. Educated patient/daughter on discharge packet, questions answered. Belongings returned to patient. Arris Meyn A Jannae Fagerstrom

## 2019-04-23 DIAGNOSIS — M4804 Spinal stenosis, thoracic region: Secondary | ICD-10-CM | POA: Diagnosis not present

## 2019-04-23 DIAGNOSIS — G4733 Obstructive sleep apnea (adult) (pediatric): Secondary | ICD-10-CM | POA: Diagnosis not present

## 2019-04-23 DIAGNOSIS — I069 Rheumatic aortic valve disease, unspecified: Secondary | ICD-10-CM | POA: Diagnosis not present

## 2019-04-23 DIAGNOSIS — J45909 Unspecified asthma, uncomplicated: Secondary | ICD-10-CM | POA: Diagnosis not present

## 2019-04-23 DIAGNOSIS — E785 Hyperlipidemia, unspecified: Secondary | ICD-10-CM | POA: Diagnosis not present

## 2019-04-23 DIAGNOSIS — I10 Essential (primary) hypertension: Secondary | ICD-10-CM | POA: Diagnosis not present

## 2019-04-23 DIAGNOSIS — Z96651 Presence of right artificial knee joint: Secondary | ICD-10-CM | POA: Diagnosis not present

## 2019-04-27 DIAGNOSIS — E785 Hyperlipidemia, unspecified: Secondary | ICD-10-CM | POA: Diagnosis not present

## 2019-04-27 DIAGNOSIS — G4733 Obstructive sleep apnea (adult) (pediatric): Secondary | ICD-10-CM | POA: Diagnosis not present

## 2019-04-27 DIAGNOSIS — J45909 Unspecified asthma, uncomplicated: Secondary | ICD-10-CM | POA: Diagnosis not present

## 2019-04-27 DIAGNOSIS — I10 Essential (primary) hypertension: Secondary | ICD-10-CM | POA: Diagnosis not present

## 2019-04-27 DIAGNOSIS — I069 Rheumatic aortic valve disease, unspecified: Secondary | ICD-10-CM | POA: Diagnosis not present

## 2019-04-27 DIAGNOSIS — M4804 Spinal stenosis, thoracic region: Secondary | ICD-10-CM | POA: Diagnosis not present

## 2019-04-28 DIAGNOSIS — M4804 Spinal stenosis, thoracic region: Secondary | ICD-10-CM | POA: Diagnosis not present

## 2019-04-28 DIAGNOSIS — I10 Essential (primary) hypertension: Secondary | ICD-10-CM | POA: Diagnosis not present

## 2019-04-28 DIAGNOSIS — J45909 Unspecified asthma, uncomplicated: Secondary | ICD-10-CM | POA: Diagnosis not present

## 2019-04-28 DIAGNOSIS — I069 Rheumatic aortic valve disease, unspecified: Secondary | ICD-10-CM | POA: Diagnosis not present

## 2019-04-28 DIAGNOSIS — E785 Hyperlipidemia, unspecified: Secondary | ICD-10-CM | POA: Diagnosis not present

## 2019-04-28 DIAGNOSIS — G4733 Obstructive sleep apnea (adult) (pediatric): Secondary | ICD-10-CM | POA: Diagnosis not present

## 2019-04-30 ENCOUNTER — Ambulatory Visit
Admission: RE | Admit: 2019-04-30 | Discharge: 2019-04-30 | Disposition: A | Discharge status: Home / Self Care | Payer: Medicare Other | Source: Ambulatory Visit

## 2019-04-30 ENCOUNTER — Other Ambulatory Visit: Payer: Self-pay

## 2019-04-30 DIAGNOSIS — M4804 Spinal stenosis, thoracic region: Secondary | ICD-10-CM | POA: Diagnosis not present

## 2019-04-30 DIAGNOSIS — G4733 Obstructive sleep apnea (adult) (pediatric): Secondary | ICD-10-CM | POA: Diagnosis not present

## 2019-04-30 DIAGNOSIS — E785 Hyperlipidemia, unspecified: Secondary | ICD-10-CM | POA: Diagnosis not present

## 2019-04-30 DIAGNOSIS — Z1231 Encounter for screening mammogram for malignant neoplasm of breast: Secondary | ICD-10-CM

## 2019-04-30 DIAGNOSIS — I10 Essential (primary) hypertension: Secondary | ICD-10-CM | POA: Diagnosis not present

## 2019-04-30 DIAGNOSIS — Z23 Encounter for immunization: Secondary | ICD-10-CM | POA: Diagnosis not present

## 2019-04-30 DIAGNOSIS — I069 Rheumatic aortic valve disease, unspecified: Secondary | ICD-10-CM | POA: Diagnosis not present

## 2019-04-30 DIAGNOSIS — J45909 Unspecified asthma, uncomplicated: Secondary | ICD-10-CM | POA: Diagnosis not present

## 2019-05-05 DIAGNOSIS — I10 Essential (primary) hypertension: Secondary | ICD-10-CM | POA: Diagnosis not present

## 2019-05-05 DIAGNOSIS — M4804 Spinal stenosis, thoracic region: Secondary | ICD-10-CM | POA: Diagnosis not present

## 2019-05-05 DIAGNOSIS — I069 Rheumatic aortic valve disease, unspecified: Secondary | ICD-10-CM | POA: Diagnosis not present

## 2019-05-05 DIAGNOSIS — J45909 Unspecified asthma, uncomplicated: Secondary | ICD-10-CM | POA: Diagnosis not present

## 2019-05-05 DIAGNOSIS — G4733 Obstructive sleep apnea (adult) (pediatric): Secondary | ICD-10-CM | POA: Diagnosis not present

## 2019-05-05 DIAGNOSIS — E785 Hyperlipidemia, unspecified: Secondary | ICD-10-CM | POA: Diagnosis not present

## 2019-05-06 DIAGNOSIS — E785 Hyperlipidemia, unspecified: Secondary | ICD-10-CM | POA: Diagnosis not present

## 2019-05-06 DIAGNOSIS — J45909 Unspecified asthma, uncomplicated: Secondary | ICD-10-CM | POA: Diagnosis not present

## 2019-05-06 DIAGNOSIS — G4733 Obstructive sleep apnea (adult) (pediatric): Secondary | ICD-10-CM | POA: Diagnosis not present

## 2019-05-06 DIAGNOSIS — I069 Rheumatic aortic valve disease, unspecified: Secondary | ICD-10-CM | POA: Diagnosis not present

## 2019-05-06 DIAGNOSIS — I10 Essential (primary) hypertension: Secondary | ICD-10-CM | POA: Diagnosis not present

## 2019-05-06 DIAGNOSIS — M4804 Spinal stenosis, thoracic region: Secondary | ICD-10-CM | POA: Diagnosis not present

## 2019-05-07 DIAGNOSIS — I069 Rheumatic aortic valve disease, unspecified: Secondary | ICD-10-CM | POA: Diagnosis not present

## 2019-05-07 DIAGNOSIS — I10 Essential (primary) hypertension: Secondary | ICD-10-CM | POA: Diagnosis not present

## 2019-05-07 DIAGNOSIS — E785 Hyperlipidemia, unspecified: Secondary | ICD-10-CM | POA: Diagnosis not present

## 2019-05-07 DIAGNOSIS — J45909 Unspecified asthma, uncomplicated: Secondary | ICD-10-CM | POA: Diagnosis not present

## 2019-05-07 DIAGNOSIS — M4804 Spinal stenosis, thoracic region: Secondary | ICD-10-CM | POA: Diagnosis not present

## 2019-05-07 DIAGNOSIS — G4733 Obstructive sleep apnea (adult) (pediatric): Secondary | ICD-10-CM | POA: Diagnosis not present

## 2019-05-11 DIAGNOSIS — M4804 Spinal stenosis, thoracic region: Secondary | ICD-10-CM | POA: Diagnosis not present

## 2019-05-11 DIAGNOSIS — G4733 Obstructive sleep apnea (adult) (pediatric): Secondary | ICD-10-CM | POA: Diagnosis not present

## 2019-05-11 DIAGNOSIS — I10 Essential (primary) hypertension: Secondary | ICD-10-CM | POA: Diagnosis not present

## 2019-05-11 DIAGNOSIS — E785 Hyperlipidemia, unspecified: Secondary | ICD-10-CM | POA: Diagnosis not present

## 2019-05-11 DIAGNOSIS — I069 Rheumatic aortic valve disease, unspecified: Secondary | ICD-10-CM | POA: Diagnosis not present

## 2019-05-11 DIAGNOSIS — J45909 Unspecified asthma, uncomplicated: Secondary | ICD-10-CM | POA: Diagnosis not present

## 2019-05-13 DIAGNOSIS — B0229 Other postherpetic nervous system involvement: Secondary | ICD-10-CM | POA: Diagnosis not present

## 2019-05-13 DIAGNOSIS — I069 Rheumatic aortic valve disease, unspecified: Secondary | ICD-10-CM | POA: Diagnosis not present

## 2019-05-13 DIAGNOSIS — I10 Essential (primary) hypertension: Secondary | ICD-10-CM | POA: Diagnosis not present

## 2019-05-13 DIAGNOSIS — E785 Hyperlipidemia, unspecified: Secondary | ICD-10-CM | POA: Diagnosis not present

## 2019-05-13 DIAGNOSIS — M4804 Spinal stenosis, thoracic region: Secondary | ICD-10-CM | POA: Diagnosis not present

## 2019-05-13 DIAGNOSIS — J45909 Unspecified asthma, uncomplicated: Secondary | ICD-10-CM | POA: Diagnosis not present

## 2019-05-13 DIAGNOSIS — G4733 Obstructive sleep apnea (adult) (pediatric): Secondary | ICD-10-CM | POA: Diagnosis not present

## 2019-05-14 DIAGNOSIS — I069 Rheumatic aortic valve disease, unspecified: Secondary | ICD-10-CM | POA: Diagnosis not present

## 2019-05-14 DIAGNOSIS — J45909 Unspecified asthma, uncomplicated: Secondary | ICD-10-CM | POA: Diagnosis not present

## 2019-05-14 DIAGNOSIS — I10 Essential (primary) hypertension: Secondary | ICD-10-CM | POA: Diagnosis not present

## 2019-05-14 DIAGNOSIS — M4804 Spinal stenosis, thoracic region: Secondary | ICD-10-CM | POA: Diagnosis not present

## 2019-05-14 DIAGNOSIS — G4733 Obstructive sleep apnea (adult) (pediatric): Secondary | ICD-10-CM | POA: Diagnosis not present

## 2019-05-14 DIAGNOSIS — E785 Hyperlipidemia, unspecified: Secondary | ICD-10-CM | POA: Diagnosis not present

## 2019-05-19 ENCOUNTER — Other Ambulatory Visit: Payer: Self-pay

## 2019-05-19 ENCOUNTER — Ambulatory Visit (INDEPENDENT_AMBULATORY_CARE_PROVIDER_SITE_OTHER): Payer: Medicare Other | Admitting: Orthopaedic Surgery

## 2019-05-19 ENCOUNTER — Encounter: Payer: Self-pay | Admitting: Orthopaedic Surgery

## 2019-05-19 DIAGNOSIS — M5416 Radiculopathy, lumbar region: Secondary | ICD-10-CM

## 2019-05-19 NOTE — Progress Notes (Signed)
Office Visit Note   Patient: Rachel Crane           Date of Birth: 1945-08-11           MRN: [id number] Visit Date: 05/19/2019              Requested by: Donald Prose, MD [address] [place],  [place] [address] PCP: Donald Prose, MD   Assessment & Plan: Visit Diagnoses:  1. Lumbar radiculopathy     Plan: Impression is lumbar radiculopathy.  She has significant stenosis at L2-3 and L3-4 on her lumbar spine MRI.  Based on discussion today she has agreed to proceed with lumbar spine ESI.  We will arrange this for her with Dr. Ernestina Patches.  Questions encouraged and answered.  Follow-up as needed. Total face to face encounter time was greater than 25 minutes and over half of this time was spent in counseling and/or coordination of care.  Follow-Up Instructions: Return if symptoms worsen or fail to improve.   Orders:  No orders of the defined types were placed in this encounter.  No orders of the defined types were placed in this encounter.     Procedures: No procedures performed   Clinical Data: No additional findings.   Subjective: Chief Complaint  Patient presents with  . Lower Back - Pain  . Left Knee - Pain    Rachel Crane is a 73 year old female who is accompanied by her daughter today.  She comes in for evaluation of lumbar radiculopathy.  She was hospitalized in early October for acute onset of severe back pain and left leg radiculopathy.  Thoracic and lumbar MRI shows significant degenerative changes as well as a disc herniation at thoracic level.  Fortunately she improved fairly rapidly during her 4-day hospital course and was then subsequently discharged.  She has been doing home health physical therapy while she has been living with her daughter in [place].  She continues to have low back pain that radiates into her thigh and knee.  She denies any groin pain.   Review of Systems  Constitutional: Negative.   HENT: Negative.   Eyes: Negative.   Respiratory:  Negative.   Cardiovascular: Negative.   Endocrine: Negative.   Musculoskeletal: Negative.   Neurological: Negative.   Hematological: Negative.   Psychiatric/Behavioral: Negative.   All other systems reviewed and are negative.    Objective: Vital Signs: There were no vitals taken for this visit.  Physical Exam Vitals signs and nursing note reviewed.  Constitutional:      Appearance: She is well-developed.  Pulmonary:     Effort: Pulmonary effort is normal.  Skin:    General: Skin is warm.     Capillary Refill: Capillary refill takes less than 2 seconds.  Neurological:     Mental Status: She is alert and oriented to person, place, and time.  Psychiatric:        Behavior: Behavior normal.        Thought Content: Thought content normal.        Judgment: Judgment normal.     Ortho Exam Left lower extremity shows no focal motor or sensory deficits.  She has subjective burning paresthesias in her lumbar spine and left thigh. Specialty Comments:  No specialty comments available.  Imaging: No results found.   PMFS History: Patient Active Problem List   Diagnosis Date Noted  . Thoracic spinal stenosis 04/18/2019  . Seasonal allergies 12/04/2018  . Allergic rhinoconjunctivitis 10/10/2018  . Moderate persistent asthma  with acute exacerbation 10/10/2018  . Pruritus 10/10/2018  . Aortic valve sclerosis 10/06/2018  . Bilateral carotid bruits 10/06/2018  . Abnormal stress ECG 10/06/2018  . Laboratory examination 10/06/2018  . Palpitations 10/06/2018  . Total knee replacement status 09/19/2017  . Sprain of anterior talofibular ligament of right ankle 12/13/2016   Past Medical History:  Diagnosis Date  . Abnormal stress ECG 10/06/2018  . Aortic valve sclerosis 10/06/2018  . Arthritis   . Asthma   . Bilateral carotid bruits 10/06/2018  . Headache    H/O bad HA, since using CPAP- she has had improvement with that issue   . Hypertension   . Laboratory examination 10/06/2018   . OSA on CPAP    settting - 8, uses CPAP q night   . Shingles     Family History  Problem Relation Age of Onset  . Breast cancer Sister     Past Surgical History:  Procedure Laterality Date  . CHOLECYSTECTOMY  1998  . TOTAL KNEE ARTHROPLASTY Right 09/19/2017   Procedure: RIGHT TOTAL KNEE ARTHROPLASTY;  Surgeon: Leandrew Koyanagi, MD;  Location: [facility];  Service: Orthopedics;  Laterality: Right;  . TUBAL LIGATION     Social History   Occupational History  . Not on file  Tobacco Use  . Smoking status: Never Smoker  . Smokeless tobacco: Never Used  Substance and Sexual Activity  . Alcohol use: Yes    Comment: sometimes wine  . Drug use: No  . Sexual activity: Not on file

## 2019-05-20 DIAGNOSIS — J45909 Unspecified asthma, uncomplicated: Secondary | ICD-10-CM | POA: Diagnosis not present

## 2019-05-20 DIAGNOSIS — I10 Essential (primary) hypertension: Secondary | ICD-10-CM | POA: Diagnosis not present

## 2019-05-20 DIAGNOSIS — I069 Rheumatic aortic valve disease, unspecified: Secondary | ICD-10-CM | POA: Diagnosis not present

## 2019-05-20 DIAGNOSIS — G4733 Obstructive sleep apnea (adult) (pediatric): Secondary | ICD-10-CM | POA: Diagnosis not present

## 2019-05-20 DIAGNOSIS — E785 Hyperlipidemia, unspecified: Secondary | ICD-10-CM | POA: Diagnosis not present

## 2019-05-20 DIAGNOSIS — M4804 Spinal stenosis, thoracic region: Secondary | ICD-10-CM | POA: Diagnosis not present

## 2019-05-20 NOTE — Addendum Note (Signed)
Addended by: Precious Bard on: 05/20/2019 09:20 AM   Modules accepted: Orders

## 2019-05-26 ENCOUNTER — Ambulatory Visit: Payer: Self-pay

## 2019-05-26 ENCOUNTER — Other Ambulatory Visit: Payer: Self-pay

## 2019-05-26 ENCOUNTER — Ambulatory Visit (INDEPENDENT_AMBULATORY_CARE_PROVIDER_SITE_OTHER): Payer: Medicare Other | Admitting: Physical Medicine and Rehabilitation

## 2019-05-26 ENCOUNTER — Encounter: Payer: Self-pay | Admitting: Physical Medicine and Rehabilitation

## 2019-05-26 VITALS — BP 167/87 | HR 102

## 2019-05-26 DIAGNOSIS — M5416 Radiculopathy, lumbar region: Secondary | ICD-10-CM

## 2019-05-26 DIAGNOSIS — M48062 Spinal stenosis, lumbar region with neurogenic claudication: Secondary | ICD-10-CM | POA: Diagnosis not present

## 2019-05-26 MED ORDER — BETAMETHASONE SOD PHOS & ACET 6 (3-3) MG/ML IJ SUSP
12.0000 mg | Freq: Once | INTRAMUSCULAR | Status: AC
  Administered 2019-05-26: 12 mg

## 2019-05-26 NOTE — Progress Notes (Signed)
 .  Numeric Pain Rating Scale and Functional Assessment Average Pain 6   In the last MONTH (on 0-10 scale) has pain interfered with the following?  1. General activity like being  able to carry out your everyday physical activities such as walking, climbing stairs, carrying groceries, or moving a chair?  Rating(6)   +Driver, -BT, -Dye Allergies.  

## 2019-05-27 ENCOUNTER — Ambulatory Visit: Payer: Medicare Other | Admitting: Allergy

## 2019-06-02 ENCOUNTER — Telehealth: Payer: Self-pay | Admitting: Orthopaedic Surgery

## 2019-06-02 ENCOUNTER — Other Ambulatory Visit: Payer: Self-pay | Admitting: Physical Medicine and Rehabilitation

## 2019-06-02 ENCOUNTER — Telehealth: Payer: Self-pay | Admitting: Physical Medicine and Rehabilitation

## 2019-06-02 DIAGNOSIS — M5416 Radiculopathy, lumbar region: Secondary | ICD-10-CM

## 2019-06-02 DIAGNOSIS — M48062 Spinal stenosis, lumbar region with neurogenic claudication: Secondary | ICD-10-CM

## 2019-06-02 DIAGNOSIS — M47816 Spondylosis without myelopathy or radiculopathy, lumbar region: Secondary | ICD-10-CM

## 2019-06-02 NOTE — Telephone Encounter (Signed)
Patient called and wanted referral to [facility] PT. Please call patient to advise  Please call @ [phone]

## 2019-06-02 NOTE — Telephone Encounter (Signed)
We can write Rx for PT on generic script and she can pick up or have mailed. She can call [facility] and make appointment and take script.

## 2019-06-03 ENCOUNTER — Telehealth: Payer: Self-pay

## 2019-06-03 ENCOUNTER — Ambulatory Visit: Payer: Medicare Other | Admitting: Neurology

## 2019-06-03 ENCOUNTER — Other Ambulatory Visit: Payer: Self-pay

## 2019-06-03 DIAGNOSIS — M5416 Radiculopathy, lumbar region: Secondary | ICD-10-CM

## 2019-06-03 NOTE — Telephone Encounter (Signed)
Same message x3.

## 2019-06-03 NOTE — Telephone Encounter (Signed)
Order made. They will call her when ready to schedule.

## 2019-06-03 NOTE — Telephone Encounter (Signed)
Ok to do

## 2019-06-03 NOTE — Telephone Encounter (Signed)
Pt did not show for their appt with Dr. Athar today.  

## 2019-06-03 NOTE — Telephone Encounter (Signed)
Tried to call patient again. Same recording that call cannot be completed at this time.

## 2019-06-03 NOTE — Telephone Encounter (Signed)
Okay for this

## 2019-06-03 NOTE — Telephone Encounter (Signed)
Rx for PT done. Called patient to advise, but call could not be completed at this time.

## 2019-06-04 NOTE — Telephone Encounter (Signed)
Checked chart. Dr. Vonzella Nipple PT.

## 2019-06-05 ENCOUNTER — Telehealth: Payer: Self-pay | Admitting: Physical Medicine and Rehabilitation

## 2019-06-05 NOTE — Telephone Encounter (Signed)
Would try L4 tf - if insurance ok might change to facet if different when I see her.

## 2019-06-05 NOTE — Telephone Encounter (Signed)
Patient is scheduled for 12/15 for injection with a driver. Patient advised to arrive 15 minutes early for her appointment. Patient is very upset that we are scheduled that far out. I offered to see if she could be referred to [place], but patient accepted appointment on 12/15.

## 2019-06-09 ENCOUNTER — Telehealth: Payer: Self-pay | Admitting: Orthopaedic Surgery

## 2019-06-09 ENCOUNTER — Other Ambulatory Visit: Payer: Self-pay

## 2019-06-09 DIAGNOSIS — M5416 Radiculopathy, lumbar region: Secondary | ICD-10-CM

## 2019-06-09 NOTE — Telephone Encounter (Signed)
Yeah referral to [facility] please.  Thanks.

## 2019-06-09 NOTE — Telephone Encounter (Signed)
Order made

## 2019-06-09 NOTE — Telephone Encounter (Signed)
See message. Please advise.

## 2019-06-16 DIAGNOSIS — H401131 Primary open-angle glaucoma, bilateral, mild stage: Secondary | ICD-10-CM | POA: Diagnosis not present

## 2019-06-16 DIAGNOSIS — H2513 Age-related nuclear cataract, bilateral: Secondary | ICD-10-CM | POA: Diagnosis not present

## 2019-06-21 ENCOUNTER — Encounter: Payer: Self-pay | Admitting: Neurology

## 2019-06-23 ENCOUNTER — Ambulatory Visit (INDEPENDENT_AMBULATORY_CARE_PROVIDER_SITE_OTHER): Payer: Medicare Other | Admitting: Neurology

## 2019-06-23 ENCOUNTER — Ambulatory Visit: Payer: Medicare Other | Admitting: Orthopaedic Surgery

## 2019-06-23 ENCOUNTER — Other Ambulatory Visit: Payer: Self-pay

## 2019-06-23 ENCOUNTER — Encounter: Payer: Self-pay | Admitting: Neurology

## 2019-06-23 VITALS — BP 132/73 | HR 78 | Ht 66.0 in | Wt 200.0 lb

## 2019-06-23 DIAGNOSIS — Z9989 Dependence on other enabling machines and devices: Secondary | ICD-10-CM

## 2019-06-23 DIAGNOSIS — G4733 Obstructive sleep apnea (adult) (pediatric): Secondary | ICD-10-CM | POA: Diagnosis not present

## 2019-06-23 NOTE — Progress Notes (Signed)
Subjective:    Patient ID: Rachel Crane is a 73 y.o. female.  HPI     Interim history:   Rachel Crane is a 73 year old right-handed woman with an underlying medical history of hypertension, arthritis, and obesity, who presents for follow-up of her recurrent headaches and sleep apnea, on CPAP therapy. The patient is unaccompanied today.  She missed an appointment on 06/03/2019.  I last saw her on 12/12/2017, at which time she was compliant with her CPAP.  She had no significant headaches.   She saw Vaughan Browner, nurse practitioner in the interim on 05/28/2018, at which time she was compliant with her CPAP and advised to follow-up routinely in 1 year.  She had an interim phone call visit with nurse practitioner Amy Lomax, on 10/29/2018, at which time she had additional concerns including fatigue and difficulty falling asleep.  She had recently been seen in the emergency room.  She had blood work done.  She was advised to use melatonin at night and follow-up closely with her primary care physician and continue to be fully compliant with her CPAP.  Today, 06/23/2019: I reviewed her CPAP compliance data from 05/23/2019 through 06/21/2019 which is a total of 30 days, during which time she used her machine every night with percent use days greater than 4 hours and 97%, indicating excellent compliance with an average usage of 8 hours and 0 minutes, residual AHI at goal at 2.7/h, leak on the low side with a 95th percentile at 2.2 L/min on a pressure of 8 cm with EPR of 2.  She reports doing well with her CPAP.  She has had back pain, it flared up recently in the past couple of months, she went to the hospital.  She had a lumbar spine MRI which showed moderate to severe foraminal narrowing at L5-S1, she had degenerative changes, she has seen Dr. Ernestina Patches.  Surgery was discussed but she decided against it.  She had an injection into the lower back about a month ago which did not help very much.  She wants to avoid  surgery, she has been taking oxycodone sparingly and takes Tylenol every 8 hours.  She has an appointment with Dr. Ernestina Patches next week.  She had been using a walker, now she uses a single-point cane.  She had radiating pain to the left.  She denies any recurrent headaches.  The patient's allergies, current medications, family history, past medical history, past social history, past surgical history and problem list were reviewed and updated as appropriate.    Previously (copied from previous notes for reference):     I saw her on 12/11/2016, at which time she was compliant with her CPAP. She reported going to bed late, around midnight. She did endorse improvement of her sleep quality and headaches were much improved. She was supposed to have a stress test due to symptoms of shortness of breath. Very good   She was seen in the interim on 06/12/2017 by Ward Givens, nurse practitioner, at which time she was compliant with treatment but her residual AHI was borderline at 5.9, her treatment pressure was therefore increased to 8 cm.    I reviewed her CPAP compliance data from 11/11/2017 through 12/10/2017 which is a total of 30 days, during which time she used her CPAP 29 days with percent used days greater than 4 hours at 97%, indicating excellent compliance with an average usage of 7 hours and 9 minutes, residual AHI at goal at 1.9 per hour, leak acceptable  with the 95th percentile at 14.5 L/m on a pressure of 8 cm with EPR of 2.    I first met her on 09/06/2016 is a referral from the emergency room, at which time she reported bilateral recurrent headaches, also nocturnal headaches. Given her history of snoring, I suggested she proceed with a sleep study. She had a baseline sleep study, followed by a CPAP titration study. I went over her test results with her in detail today. Her baseline sleep study from 09/18/2016 showed a sleep efficiency of 81%, an increased percentage of stage II sleep, absence of  slow-wave sleep and REM sleep was 20.8% with a normal REM latency. Total AHI was elevated at 40.5 per hour, average oxygen saturation was 96%, nadir was 81%. She had noticed significant PLMS. Based on her test results as well as her sleep-related complaints and recurrent headaches I suggested she return for a CPAP titration study, which she had on 10/09/2016. She was fitted with nasal pillows. Sleep efficiency was 55.6%, sleep latency was 77.5 minutes, REM latency was 52.5 minutes. She had an increased percentage of slow-wave sleep and REM sleep was 18.3%. She was titrated from 5 cm of CPAP to 6 cm of CPAP, final AHI was 0 per hour, O2 nadir of 95%, supine REM sleep achieved. Based on her test results I prescribed CPAP therapy for home use.   I reviewed her CPAP compliance data from 10/29/2016 through 11/27/2016, which is a total of 30 days but technically she had only been on treatment for 28 days but was compliant with a compliance percentage of 70%, average usage of 5 hours and 36 minutes, residual AHI borderline at 6.6, leak acceptable with the 95th percentile at 16.8 L/m on a pressure of 6 cm with EPR of 2.      09/06/2016: She presented to the emergency room on 08/30/2016 with a two-week history of severe headache which was primarily bilateral headache, also on the top of her head. I reviewed the emergency room records. She reported a sharp intermittent headache. It was worse with lying down and became throbbing. She had taken some ibuprofen over-the-counter. She did not report much in the way of photophobia. She had no nausea or vomiting or other neurological accompaniment and no history of trauma. She had a head CT without contrast on 08/30/2016 which I reviewed: No acute intracranial abnormalities. In addition, I first only reviewed the images through the PACS system. I did not detect any significant atrophy or white matter changes. She was treated symptomatically with Tylenol, naproxen, and she  declined Reglan. She was discharged to home. She reports that she has not been able to sleep very well. She has intermittent, rare in fact episodes of sharp shooting pains that start in the right occipital area and lasts for only seconds at a time. In addition, she has woken up in the middle of the night with headaches that have been building up gradually and seemed to be worse when she does not sleep well. She reports significant issues with her sleep since she moved here from [place]. She moved in early November to be closer to her children. She has 1 child in [place], 2 in [place]. She lives in her own home. She has adjusted to her new living environment but still has some anxiety and has not been able to sleep very well. She has a known history of snoring. She has woken herself up with a sense of gasping for air and foreign body  sensation in her throat. She goes to bed sometimes as early as 8 PM, he watches TV in her bedroom but turns it off. She has a small dog that sometimes sleeps at the foot and but does not bother her. She lives alone. She is widowed for the past 7 years. Wakeup time varies. She is often up in the early morning hours and cannot go back to sleep. She has had more anxiety and even mild depression type symptoms lately. She is scheduled to see her new primary care provider on March 1. She does not drink caffeine daily, she drinks alcohol very occasionally and does not smoke. She tries to drink water and some juice. She has knee pain at times but denies frank restless leg type symptoms. She has significant nocturia about 3-4 times per average night. She tries to rest during the day but typically does not fall asleep. Epworth sleepiness score is 0 out of 24, fatigue score is 63 out of 63.  Her Past Medical History Is Significant For: Past Medical History:  Diagnosis Date  . Abnormal stress ECG 10/06/2018  . Aortic valve sclerosis 10/06/2018  . Arthritis   . Asthma   . Bilateral carotid bruits  10/06/2018  . Headache    H/O bad HA, since using CPAP- she has had improvement with that issue   . Hypertension   . Laboratory examination 10/06/2018  . OSA on CPAP    settting - 8, uses CPAP q night   . Shingles     Her Past Surgical History Is Significant For: Past Surgical History:  Procedure Laterality Date  . CHOLECYSTECTOMY  1998  . TOTAL KNEE ARTHROPLASTY Right 09/19/2017   Procedure: RIGHT TOTAL KNEE ARTHROPLASTY;  Surgeon: Leandrew Koyanagi, MD;  Location: [facility];  Service: Orthopedics;  Laterality: Right;  . TUBAL LIGATION      Her Family History Is Significant For: Family History  Problem Relation Age of Onset  . Breast cancer Sister     Her Social History Is Significant For: Social History   Socioeconomic History  . Marital status: Widowed    Spouse name: Not on file  . Number of children: 3  . Years of education: Not on file  . Highest education level: Not on file  Occupational History  . Not on file  Social Needs  . Financial resource strain: Not on file  . Food insecurity    Worry: Not on file    Inability: Not on file  . Transportation needs    Medical: Not on file    Non-medical: Not on file  Tobacco Use  . Smoking status: Never Smoker  . Smokeless tobacco: Never Used  Substance and Sexual Activity  . Alcohol use: Yes    Comment: sometimes wine  . Drug use: No  . Sexual activity: Not on file  Lifestyle  . Physical activity    Days per week: Not on file    Minutes per session: Not on file  . Stress: Not on file  Relationships  . Social Herbalist on phone: Not on file    Gets together: Not on file    Attends religious service: Not on file    Active member of club or organization: Not on file    Attends meetings of clubs or organizations: Not on file    Relationship status: Not on file  Other Topics Concern  . Not on file  Social History Narrative  . Not on file  Her Allergies Are:  No Known Allergies:   Her Current  Medications Are:  Outpatient Encounter Medications as of 06/23/2019  Medication Sig  . aspirin EC 81 MG tablet Take 1 tablet (81 mg total) by mouth daily.  . budesonide-formoterol (SYMBICORT) 80-4.5 MCG/ACT inhaler 2 Puffs 2 times daily (Patient taking differently: Inhale 2 puffs into the lungs daily as needed (shortness of breath). )  . Cholecalciferol (VITAMIN D3) 25 MCG (1000 UT) CAPS Take by mouth.  . fluticasone (FLONASE) 50 MCG/ACT nasal spray Place 2 sprays into both nostrils daily.  Marland Kitchen HYDROCHLOROTHIAZIDE PO Take by mouth.  . latanoprost (XALATAN) 0.005 % ophthalmic solution Place 1 drop into both eyes at bedtime.   Marland Kitchen loratadine (CLARITIN) 10 MG tablet Take 1 tablet (10 mg total) by mouth daily. (Patient taking differently: Take 10 mg by mouth daily as needed for allergies or rhinitis. )  . losartan (COZAAR) 100 MG tablet Take 1 tablet (100 mg total) by mouth daily.  . Multiple Vitamins-Minerals (WOMENS 50+ MULTI VITAMIN/MIN PO) Take by mouth daily.  . nitroGLYCERIN (NITROSTAT) 0.4 MG SL tablet Place 1 tablet (0.4 mg total) under the tongue every 5 (five) minutes as needed for chest pain.  . pravastatin (PRAVACHOL) 10 MG tablet Take 10 mg by mouth at bedtime.  . [DISCONTINUED] cetirizine HCl (ZYRTEC) 1 MG/ML solution Take 5 mLs (5 mg total) by mouth daily for 10 days.  . [DISCONTINUED] potassium chloride SA (K-DUR,KLOR-CON) 20 MEQ tablet Take 20 mEq by mouth daily.    No facility-administered encounter medications on file as of 06/23/2019.   :  Review of Systems:  Out of a complete 14 point review of systems, all are reviewed and negative with the exception of these symptoms as listed below: Review of Systems  Neurological:       Pt presents today to discuss her cpap. Pt reports that her cpap is going well.    Objective:  Neurological Exam  Physical Exam Physical Examination:   Vitals:   06/23/19 1444  BP: 132/73  Pulse: 78    General Examination: The patient is a very  pleasant 73 y.o. female in no acute distress. She appears well-developed and well-nourished and well groomed.   HEENT:Normocephalic, atraumatic, pupils are equal, round and reactive to light and accommodation.Bilateral cataracts. She has corrective eyeglasses, extraocular tracking is good without limitation to gaze excursion or nystagmus noted. Normal smooth pursuit is noted. Hearing is grossly intact. Face is symmetric with normal facial animation and normal facial sensation. Speech is clear with no dysarthria noted. There is no hypophonia. There is no lip, neck/head, jaw or voice tremor. Neck is supple with full range of passive and active motion. Oropharynx exam reveals: mildmouth dryness, adequatedental hygiene and markedairway crowding.Tongue protrudes centrally and palate elevates symmetrically.   Chest:Clear to auscultation without wheezing, rhonchi or crackles noted.  Heart:S1+S2+0, regular and normal without murmurs, rubs or gallops noted.   Abdomen:Soft, non-tender and non-distended with normal bowel sounds appreciated on auscultation.  Extremities:There is no edemain the legs.   Skin: Warm and dry without trophic changes noted.   Musculoskeletal: exam reveals no obvious joint deformities, tenderness or joint swelling or erythema, unremarkable scar from right total knee replacement recently.  Neurologically:  Mental status: The patient is awake, alert and oriented in all 4 spheres. Herimmediate and remote memory, attention, language skills and fund of knowledge are appropriate. There is no evidence of aphasia, agnosia, apraxia or anomia. Speech is clear with normal prosody and enunciation. Thought  process is linear. Mood is normaland affect is anxious. Cranial nerves II - XII are as described above under HEENT exam. In addition: shoulder shrug is normal with equal shoulder height noted. Motor exam: Normal bulk, strength and tone is noted. There is no tremor. Fine  motor skills and coordination:grosslyintact.  Cerebellar testing: No dysmetria or intention tremor. There is no truncal or gait ataxia.  Sensory exam: intact to light touch in the upper and lower extremities.  Gait, station and balance: Shestands easily. No veering to one side is noted. No leaning to one side is noted. Posture is age-appropriate and stance is narrow based. Gait shows normalstride length and normalpace. No problems turning are noted. No obvious limp.  Assessment and Plan:  In summary, Kerrington Sova a very pleasant 73 year old female with an underlying medical history of hypertension, arthritis, and obesity, who presents forFU consultation of her obstructive sleep apnea, well established on treatment with CPAP at 8 cm. She compliant with treatment. She has had no recent headaches. She has been having LBP, radiating to the L leg. She has been seeing Dr. Ernestina Patches and has appt pending for next week. She has received an epidural steroid injection.  Surgery was apparently discussed with her but she declined. She had a baseline sleep study on 09/18/16, followedby a CPAP titration study on 10/09/2016. We reviewed her most recent compliance data. She is essentially without headaches at this point. She had a prior head CT which was benign. Neurological exam continues to be nonfocal. She reports Ongoing good results with CPAP therapy.  She noted better sleep consolidation, feeling better rested, having more daytime energy and improvement of her nocturnal headaches.  She is advised to follow-up routinely in 1 year to see the nurse practitioner.  I renewed her CPAP supply order today.  I answered all her questions today and she was in agreement. I spent 20 minutes in total face-to-face time with the patient, more than 50% of which was spent in counseling and coordination of care, reviewing test results, reviewing medication and discussing or reviewing the diagnosis of OSA, its prognosis and treatment  options. Pertinent laboratory and imaging test results that were available during this visit with the patient were reviewed by me and considered in my medical decision making (see chart for details).

## 2019-06-23 NOTE — Progress Notes (Signed)
Order for cpap supplies sent to [facility] via community message. Confirmation received that the order transmitted was successful.  

## 2019-06-23 NOTE — Patient Instructions (Signed)
Please continue using your CPAP regularly. While your insurance requires that you use CPAP at least 4 hours each night on 70% of the nights, I recommend, that you not skip any nights and use it throughout the night if you can. Getting used to CPAP and staying with the treatment long term does take time and patience and discipline. Untreated obstructive sleep apnea when it is moderate to severe can have an adverse impact on cardiovascular health and raise her risk for heart disease, arrhythmias, hypertension, congestive heart failure, stroke and diabetes. Untreated obstructive sleep apnea causes sleep disruption, nonrestorative sleep, and sleep deprivation. This can have an impact on your day to day functioning and cause daytime sleepiness and impairment of cognitive function, memory loss, mood disturbance, and problems focussing. Using CPAP regularly can improve these symptoms.  Keep up the good work! We can see you in 1 year, you can see one of our nurse practitioners as you are stable. 

## 2019-06-26 ENCOUNTER — Other Ambulatory Visit: Payer: Self-pay | Admitting: Surgical

## 2019-06-26 ENCOUNTER — Encounter: Payer: Self-pay | Admitting: Orthopaedic Surgery

## 2019-06-26 ENCOUNTER — Other Ambulatory Visit: Payer: Self-pay

## 2019-06-26 ENCOUNTER — Ambulatory Visit (INDEPENDENT_AMBULATORY_CARE_PROVIDER_SITE_OTHER): Payer: Medicare Other | Admitting: Orthopaedic Surgery

## 2019-06-26 ENCOUNTER — Ambulatory Visit (INDEPENDENT_AMBULATORY_CARE_PROVIDER_SITE_OTHER): Payer: Medicare Other

## 2019-06-26 DIAGNOSIS — M25562 Pain in left knee: Secondary | ICD-10-CM

## 2019-06-26 DIAGNOSIS — G8929 Other chronic pain: Secondary | ICD-10-CM | POA: Diagnosis not present

## 2019-06-26 DIAGNOSIS — M5416 Radiculopathy, lumbar region: Secondary | ICD-10-CM | POA: Diagnosis not present

## 2019-06-26 DIAGNOSIS — M5442 Lumbago with sciatica, left side: Secondary | ICD-10-CM | POA: Diagnosis not present

## 2019-06-26 MED ORDER — TIZANIDINE HCL 4 MG PO TABS: 4.0000 mg | ORAL_TABLET | Freq: Three times a day (TID) | ORAL | 0 refills | Status: DC

## 2019-06-26 MED ORDER — CYCLOBENZAPRINE HCL 5 MG PO TABS: 5.0000 mg | ORAL_TABLET | Freq: Three times a day (TID) | ORAL | 3 refills | Status: DC | PRN

## 2019-06-26 MED ORDER — OXYCODONE HCL 5 MG PO TABS: 5.0000 mg | ORAL_TABLET | Freq: Every day | ORAL | 0 refills | Status: DC | PRN

## 2019-06-26 NOTE — Progress Notes (Signed)
Office Visit Note   Patient: Rachel Crane           Date of Birth: 11-Jan-1946           MRN: [id number] Visit Date: 06/26/2019              Requested by: Donald Prose, MD [place],  [place] [address] PCP: Donald Prose, MD   Assessment & Plan: Visit Diagnoses:  1. Chronic pain of left knee   2. Lumbar radiculopathy   3. Left-sided low back pain with left-sided sciatica, unspecified chronicity     Plan: We had a lengthy discussion today regarding her continued low back pain.  At this point based on her discussion I think it is best to refer her to one of the neurosurgeons for possible surgical treatment.  I refilled her Flexeril and oxycodone today.  Questions encouraged and answered.  Follow-up as needed.  This note is not being shared with the patient for the following reason: To respect privacy (The patient or proxy has requested that the information not be shared).   Follow-Up Instructions: Return if symptoms worsen or fail to improve.   Orders:  Orders Placed This Encounter  Procedures  . XR KNEE 3 VIEW LEFT   Meds ordered this encounter  Medications  . cyclobenzaprine (FLEXERIL) 5 MG tablet    Sig: Take 1 tablet (5 mg total) by mouth 3 (three) times daily as needed for muscle spasms.    Dispense:  30 tablet    Refill:  3  . oxyCODONE (OXY IR/ROXICODONE) 5 MG immediate release tablet    Sig: Take 1 tablet (5 mg total) by mouth daily as needed for severe pain.    Dispense:  20 tablet    Refill:  0      Procedures: No procedures performed   Clinical Data: No additional findings.   Subjective: Chief Complaint  Patient presents with  . Left Knee - Pain  . Right Knee - Pain    Rachel Crane comes in today for continued severe low back pain.  She is tearful.  She states that she cannot live like this anymore.  Her pain is radiating down both of her legs constantly.  The ESI with Dr. Ernestina Patches did not help.   Review of Systems  Constitutional:  Negative.   HENT: Negative.   Eyes: Negative.   Respiratory: Negative.   Cardiovascular: Negative.   Endocrine: Negative.   Musculoskeletal: Negative.   Neurological: Negative.   Hematological: Negative.   Psychiatric/Behavioral: Negative.   All other systems reviewed and are negative.    Objective: Vital Signs: There were no vitals taken for this visit.  Physical Exam Vitals and nursing note reviewed.  Constitutional:      Appearance: She is well-developed.  Pulmonary:     Effort: Pulmonary effort is normal.  Skin:    General: Skin is warm.     Capillary Refill: Capillary refill takes less than 2 seconds.  Neurological:     Mental Status: She is alert and oriented to person, place, and time.  Psychiatric:        Behavior: Behavior normal.        Thought Content: Thought content normal.        Judgment: Judgment normal.     Ortho Exam Low back exam bilateral lower extremity exams are unchanged. Specialty Comments:  No specialty comments available.  Imaging: XR KNEE 3 VIEW LEFT  Result Date: 06/26/2019 Stable degenerative changes  of the left knee.  Synovial chondromatosis.    PMFS History: Patient Active Problem List   Diagnosis Date Noted  . Chronic pain of left knee 06/26/2019  . Lumbar radiculopathy 06/26/2019  . Left-sided low back pain with left-sided sciatica 06/26/2019  . Thoracic spinal stenosis 04/18/2019  . Seasonal allergies 12/04/2018  . Allergic rhinoconjunctivitis 10/10/2018  . Moderate persistent asthma with acute exacerbation 10/10/2018  . Pruritus 10/10/2018  . Aortic valve sclerosis 10/06/2018  . Bilateral carotid bruits 10/06/2018  . Abnormal stress ECG 10/06/2018  . Laboratory examination 10/06/2018  . Palpitations 10/06/2018  . Total knee replacement status 09/19/2017  . Sprain of anterior talofibular ligament of right ankle 12/13/2016   Past Medical History:  Diagnosis Date  . Abnormal stress ECG 10/06/2018  . Aortic valve  sclerosis 10/06/2018  . Arthritis   . Asthma   . Bilateral carotid bruits 10/06/2018  . Headache    H/O bad HA, since using CPAP- she has had improvement with that issue   . Hypertension   . Laboratory examination 10/06/2018  . OSA on CPAP    settting - 8, uses CPAP q night   . Shingles     Family History  Problem Relation Age of Onset  . Breast cancer Sister     Past Surgical History:  Procedure Laterality Date  . CHOLECYSTECTOMY  1998  . TOTAL KNEE ARTHROPLASTY Right 09/19/2017   Procedure: RIGHT TOTAL KNEE ARTHROPLASTY;  Surgeon: Leandrew Koyanagi, MD;  Location: [facility];  Service: Orthopedics;  Laterality: Right;  . TUBAL LIGATION     Social History   Occupational History  . Not on file  Tobacco Use  . Smoking status: Never Smoker  . Smokeless tobacco: Never Used  Substance and Sexual Activity  . Alcohol use: Yes    Comment: sometimes wine  . Drug use: No  . Sexual activity: Not on file

## 2019-06-30 ENCOUNTER — Telehealth: Payer: Self-pay | Admitting: Orthopaedic Surgery

## 2019-06-30 ENCOUNTER — Encounter: Payer: Medicare Other | Admitting: Physical Medicine and Rehabilitation

## 2019-06-30 ENCOUNTER — Other Ambulatory Visit: Payer: Self-pay

## 2019-06-30 DIAGNOSIS — M5416 Radiculopathy, lumbar region: Secondary | ICD-10-CM

## 2019-06-30 NOTE — Telephone Encounter (Signed)
I called Rachel Crane with [facility] to advise her of the order, Rachel Crane called pt left vm to return call, Rachel Crane has openings for Thursday morning and Friday morning where she can fit pt in. Pending call back from pt to schedule appt with [facility]

## 2019-06-30 NOTE — Telephone Encounter (Signed)
Okay to do referral

## 2019-06-30 NOTE — Telephone Encounter (Signed)
yes

## 2019-06-30 NOTE — Telephone Encounter (Signed)
Order made. Sabrina aware.

## 2019-07-02 ENCOUNTER — Other Ambulatory Visit: Payer: Self-pay

## 2019-07-02 ENCOUNTER — Ambulatory Visit
Admission: RE | Admit: 2019-07-02 | Discharge: 2019-07-02 | Disposition: A | Discharge status: Home / Self Care | Payer: Medicare Other | Source: Ambulatory Visit | Attending: Orthopaedic Surgery | Admitting: Orthopaedic Surgery

## 2019-07-02 DIAGNOSIS — M5416 Radiculopathy, lumbar region: Secondary | ICD-10-CM

## 2019-07-02 MED ORDER — IOPAMIDOL (ISOVUE-M 200) INJECTION 41%
1.0000 mL | Freq: Once | INTRAMUSCULAR | Status: AC
  Administered 2019-07-02: 09:00:00 1 mL via EPIDURAL

## 2019-07-02 MED ORDER — METHYLPREDNISOLONE ACETATE 40 MG/ML INJ SUSP (RADIOLOG
120.0000 mg | Freq: Once | INTRAMUSCULAR | Status: AC
  Administered 2019-07-02: 120 mg via EPIDURAL

## 2019-07-02 NOTE — Discharge Instructions (Signed)

## 2019-07-28 ENCOUNTER — Other Ambulatory Visit: Payer: Self-pay | Admitting: Physical Medicine and Rehabilitation

## 2019-07-28 ENCOUNTER — Encounter: Payer: Self-pay | Admitting: Physical Medicine and Rehabilitation

## 2019-07-28 ENCOUNTER — Ambulatory Visit (INDEPENDENT_AMBULATORY_CARE_PROVIDER_SITE_OTHER): Payer: Medicare Other | Admitting: Physical Medicine and Rehabilitation

## 2019-07-28 ENCOUNTER — Other Ambulatory Visit: Payer: Self-pay

## 2019-07-28 VITALS — BP 145/82 | HR 88 | Ht 66.0 in | Wt 190.0 lb

## 2019-07-28 DIAGNOSIS — M5416 Radiculopathy, lumbar region: Secondary | ICD-10-CM

## 2019-07-28 DIAGNOSIS — M48062 Spinal stenosis, lumbar region with neurogenic claudication: Secondary | ICD-10-CM | POA: Diagnosis not present

## 2019-07-28 DIAGNOSIS — M4804 Spinal stenosis, thoracic region: Secondary | ICD-10-CM | POA: Diagnosis not present

## 2019-07-28 MED ORDER — MELOXICAM 15 MG PO TABS: 15.0000 mg | ORAL_TABLET | Freq: Every day | ORAL | 0 refills | Status: DC

## 2019-07-28 NOTE — Progress Notes (Signed)
 .  Numeric Pain Rating Scale and Functional Assessment Average Pain 6 Pain Right Now 6 My pain is intermittent, dull, aching and stinging Pain is worse with: walking, bending and some activites Pain improves with: medication   In the last MONTH (on 0-10 scale) has pain interfered with the following?  1. General activity like being  able to carry out your everyday physical activities such as walking, climbing stairs, carrying groceries, or moving a chair?  Rating(6)  2. Relation with others like being able to carry out your usual social activities and roles such as  activities at home, at work and in your community. Rating(6)  3. Enjoyment of life such that you have  been bothered by emotional problems such as feeling anxious, depressed or irritable?  Rating(4)

## 2019-07-28 NOTE — Telephone Encounter (Signed)
Please advise 

## 2019-07-29 ENCOUNTER — Encounter: Payer: Self-pay | Admitting: Physical Medicine and Rehabilitation

## 2019-07-29 NOTE — Progress Notes (Signed)
Rachel Crane - 74 y.o. female MRN [id number]  Date of birth: [birth year]  Office Visit Note: Visit Date: 07/28/2019 PCP: Donald Prose, MD Referred by: Donald Prose, MD  Subjective: Chief Complaint  Patient presents with  . Lower Back - Pain  . Left Leg - Pain   HPI: Rachel Crane is a 74 y.o. female who comes in today For reevaluation management of low back pain and left radicular leg pain and knee pain.  Patient followed by Dr. Colen Darling from an orthopedic standpoint.  Her history is well-documented but basically was in the emergency room and severe left radicular leg pain.  She saw Dr. Ashok Pall at the time and MRI of the lumbar spine showed multilevel stenosis particularly at L3-4 but also with foraminal stenosis on the right at L5.  MRI of the time was able to see the lower thoracic region and there was noted edema in the cord at T10.  Thoracic MRI showed significant stenosis at T10.  She was discharged at the time with some pain control.  We ended up completing a left L3 transforaminal injection at the request of Dr. Eduard Roux and this seemed to help a great deal but just for a few days.  The patient then had epidural injection at [facility] imaging which was a left L5-S1 interlaminar injection.  This again also helped the last for a couple of weeks.  She is very confused on the course of what should be done at this point.  Is very hard explaining to her at times but we do break things down to a logical step and unfortunately something the medicine just do not have a specific answer.  I think her leg pain really is from the narrowing and she does have a pretty severe issue at T10.  Neurosurgical evaluation was requested by Dr. Eduard Roux but the patient did not want to go there unless she had another injection to see how I would turn out.  She really does not want surgery.  We had a very long discussion today I met with her for over 30 minutes talking about stenosis and stenosis in the cervical  and thoracic spine versus lumbar spine.  Talked about the process of consultation with a neurosurgeon and what that would entail.  Shoulder images of the injections that were performed and how they are different.  Pretty much educated her on the entire lumbar spine.  One of the biggest problems that she wants everything completed instantaneously and it is hard to get her to realize that we do see a busy practice we do have certain wait times on things even though we try to do it as fast as we can.  Her left radicular leg pain is still worse with standing and ambulating.  It is more of an L5 distribution.  She does use Tylenol arthritis.  She is asking about meloxicam.  She has had stronger pain medication but does not like to take that at all.  She reports her pain is a 6 out of 10 and overall she says from the initial onset of pain she has been somewhat better but it still fairly significant.  No focal weakness.  No bowel or bladder changes.  Review of Systems  Constitutional: Negative for chills, fever, malaise/fatigue and weight loss.  HENT: Negative for hearing loss and sinus pain.   Eyes: Negative for blurred vision, double vision and photophobia.  Respiratory: Negative for cough and shortness of breath.   Cardiovascular:  Negative for chest pain, palpitations and leg swelling.  Gastrointestinal: Negative for abdominal pain, nausea and vomiting.  Genitourinary: Negative for flank pain.  Musculoskeletal: Positive for back pain and joint pain. Negative for myalgias.  Skin: Negative for itching and rash.  Neurological: Negative for tremors, focal weakness and weakness.  Endo/Heme/Allergies: Negative.   Psychiatric/Behavioral: Negative for depression.  All other systems reviewed and are negative.  Otherwise per HPI.  Assessment & Plan: Visit Diagnoses:  1. Spinal stenosis of thoracic region   2. Spinal stenosis of lumbar region with neurogenic claudication   3. Lumbar radiculopathy     Plan:  Findings:  Low back and left radicular leg pain chronic worsening and severe somewhat better overall than initial onset but still problematic and difficult.  2 epidural injections at 2 different locations have helped but just not have helped long lasting.  I do think that step is a left L4-5 interlaminar epidural steroid injection.  Hopefully this will help her for many months and it something she can continue to have that we discussed that.  I do think the lower lumbar stenosis is the cause of her issue.  She has some congenital narrowing throughout her lumbar and thoracic spine.  A separate more problematic issue is the T10-11 stenosis.  There could be a small amount of cord edema noted by the radiologist on the reports.  Reviewing the images myself on the computer is very hard to see.  I think for this fact along she does need to seek neurosurgical follow-up just to see if there is anything that should be done or to follow that in case it got worse.  Stenosis around the cord will be more problematic than lumbar stenosis.  I tried to explain that to her at length today.    Meds & Orders:  Meds ordered this encounter  Medications  . meloxicam (MOBIC) 15 MG tablet    Sig: Take 1 tablet (15 mg total) by mouth daily. Take with food    Dispense:  30 tablet    Refill:  0   No orders of the defined types were placed in this encounter.   Follow-up: Return for Left L4-5 interlaminar epidural steroid injection..   Procedures: No procedures performed  No notes on file   Clinical History: MRI LUMBAR SPINE WITHOUT CONTRAST  FINDINGS:   Alignment: Trace anterolisthesis L3 on L4 and 0.5 cm anterolisthesis L4 on L5 is due to facet arthropathy.  Vertebrae: No fracture, evidence of discitis, or bone lesion.  Conus medullaris and cauda equina: Conus extends to the L1 level. Conus and cauda equina appear normal. Although it is at the superior margin of the scan, there appears to be edema within  the distal cord at T10-11.  Paraspinal and other soft tissues: Small T2 hyperintense lesions in the right kidney are most consistent with cysts.  Disc levels:  T10-11 and T11-12 are imaged in the sagittal plane only. Disc bulge, facet degenerative disease and ligamentum flavum thickening are seen at T10-11. There is central canal stenosis and bilateral foraminal narrowing.  T12-L1: Mild facet degenerative disease. Otherwise negative.  L1-2: There is ligamentum flavum thickening and a shallow disc bulge. Mild central canal narrowing is present. Foramina open.  L2-3: Bulky ligamentum flavum thickening, moderate facet arthropathy and a shallow disc bulge. There is moderately severe to severe central canal stenosis and bilateral subarticular recess narrowing.  L4-5: Advanced bilateral facet degenerative change and ligamentum flavum thickening. The disc is uncovered with a shallow  bulge. Moderately severe to severe central canal stenosis and lateral recess narrowing. There is also mild to moderate bilateral foraminal narrowing.  L4-5: The disc is uncovered without bulging. Advanced facet degenerative changes present. There is mild central canal and bilateral subarticular recess narrowing. Foramina are open.  L5-S1: Facet degenerative change and a shallow right paracentral protrusion. The central canal is open. Moderate to moderately severe foraminal narrowing is worse on the right.  IMPRESSION: Possible signal abnormality in the distal cord at T10-11 where there appears to be marked central canal stenosis. This level is imaged in the sagittal plane only and is at the superior margin of the scan. Recommend thoracic spine MRI without contrast for further evaluation.  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  Moderately severe to severe central canal and bilateral lateral recess narrowing L2-3 and L3-4.  Mild central canal and bilateral  subarticular recess narrowing at L4-5.  Moderate to moderately severe foraminal narrowing at L5-S1 is worse on the right. The central canal is open.  Critical Value/emergent results were called by telephone at the time of interpretation on 04/17/2019 at 4:22 pm to providerDr. Francia Greaves, Who verbally acknowledged these results.   Electronically Signed By: Inge Rise M.D. On: 04/17/2019 16:25 ------ MRI THORACIC SPINE WITHOUT CONTRAST   Alignment: Exaggerated thoracic kyphosis. No significant spondylolisthesis.   Disc levels:  At T10-T11, there is a small disc bulge with superimposed small right center disc protrusion. Facet arthrosis. Prominent ligamentum flavum hypertrophy (particularly on the left). Resultant severe spinal canal stenosis with spinal cord flattening. Question subtle T2 hyperintensity within the spinal cord at this level which may reflect myelopathic change.  These results were called by telephone at the time of interpretation on 04/17/2019 at 8:09 pm to provider [facility] , who verbally acknowledged these results.  IMPRESSION: Significantly motion degraded examination.  At T10-T11, small disc bulge with superimposed small right center disc protrusion. Facet arthrosis. Prominent ligamentum flavum hypertrophy (particularly on the left). Resultant severe spinal canal stenosis with spinal cord flattening. Question subtle T2 hyperintensity within the spinal cord at this level, which may reflect myelopathic.  No significant spinal canal stenosis at the remaining levels. No more than mild neural foraminal narrowing within the thoracic spine.   Electronically Signed   By: Kellie Simmering   On: 04/17/2019 20:11   She reports that she has never smoked. She has never used smokeless tobacco. No results for input(s): HGBA1C, LABURIC in the last 8760 hours.  Objective:  VS:  HT:_0  (167.6 cm)   WT:190 lb (86.2 kg)  BMI:30.68    BP:(!)  145/82  HR:88bpm  TEMP: ( )  RESP:  Physical Exam Vitals and nursing note reviewed.  Constitutional:      General: She is not in acute distress.    Appearance: Normal appearance. She is well-developed. She is obese. She is not ill-appearing.  HENT:     Head: Normocephalic and atraumatic.  Eyes:     Conjunctiva/sclera: Conjunctivae normal.     Pupils: Pupils are equal, round, and reactive to light.  Cardiovascular:     Rate and Rhythm: Normal rate.     Pulses: Normal pulses.  Pulmonary:     Effort: Pulmonary effort is normal.  Musculoskeletal:     Right lower leg: No edema.     Left lower leg: No edema.     Comments: Patient has difficulty going from sit to stand.  She ambulates with antalgic gait to the left.  She  has a negative slump test.  She has good distal strength.  No pain with hip rotation.  Skin:    General: Skin is warm and dry.     Findings: No erythema or rash.  Neurological:     General: No focal deficit present.     Mental Status: She is alert and oriented to person, place, and time.     Sensory: No sensory deficit.     Motor: No abnormal muscle tone.     Coordination: Coordination normal.     Gait: Gait abnormal.  Psychiatric:        Mood and Affect: Mood normal.        Behavior: Behavior normal.     Ortho Exam Imaging: No results found.  Past Medical/Family/Surgical/Social History: Medications & Allergies reviewed per EMR, new medications updated. Patient Active Problem List   Diagnosis Date Noted  . Chronic pain of left knee 06/26/2019  . Lumbar radiculopathy 06/26/2019  . Left-sided low back pain with left-sided sciatica 06/26/2019  . Thoracic spinal stenosis 04/18/2019  . Seasonal allergies 12/04/2018  . Allergic rhinoconjunctivitis 10/10/2018  . Moderate persistent asthma with acute exacerbation 10/10/2018  . Pruritus 10/10/2018  . Aortic valve sclerosis 10/06/2018  . Bilateral carotid bruits 10/06/2018  . Abnormal stress ECG 10/06/2018  .  Laboratory examination 10/06/2018  . Palpitations 10/06/2018  . Total knee replacement status 09/19/2017  . Sprain of anterior talofibular ligament of right ankle 12/13/2016   Past Medical History:  Diagnosis Date  . Abnormal stress ECG 10/06/2018  . Aortic valve sclerosis 10/06/2018  . Arthritis   . Asthma   . Bilateral carotid bruits 10/06/2018  . Headache    H/O bad HA, since using CPAP- she has had improvement with that issue   . Hypertension   . Laboratory examination 10/06/2018  . OSA on CPAP    settting - 8, uses CPAP q night   . Shingles    Family History  Problem Relation Age of Onset  . Breast cancer Sister    Past Surgical History:  Procedure Laterality Date  . CHOLECYSTECTOMY  1998  . TOTAL KNEE ARTHROPLASTY Right 09/19/2017   Procedure: RIGHT TOTAL KNEE ARTHROPLASTY;  Surgeon: Leandrew Koyanagi, MD;  Location: [facility];  Service: Orthopedics;  Laterality: Right;  . TUBAL LIGATION     Social History   Occupational History  . Not on file  Tobacco Use  . Smoking status: Never Smoker  . Smokeless tobacco: Never Used  Substance and Sexual Activity  . Alcohol use: Yes    Comment: sometimes wine  . Drug use: No  . Sexual activity: Not on file

## 2019-07-29 NOTE — Telephone Encounter (Signed)
Please call pharmacy and or patient, I am only doing 30 day supply, if she wants 90 days she needs to ok with PCP. It is for health reasons not cost. I want her to take for 2 to 4 weeks only every day. After that the risk to benefit would need to be weighed.

## 2019-07-29 NOTE — Telephone Encounter (Signed)
Please advise 

## 2019-07-30 DIAGNOSIS — Z1211 Encounter for screening for malignant neoplasm of colon: Secondary | ICD-10-CM | POA: Diagnosis not present

## 2019-08-06 ENCOUNTER — Ambulatory Visit: Payer: Self-pay

## 2019-08-06 ENCOUNTER — Telehealth: Payer: Self-pay | Admitting: *Deleted

## 2019-08-06 ENCOUNTER — Ambulatory Visit (INDEPENDENT_AMBULATORY_CARE_PROVIDER_SITE_OTHER): Payer: Medicare Other | Admitting: Physical Medicine and Rehabilitation

## 2019-08-06 ENCOUNTER — Encounter: Payer: Self-pay | Admitting: Physical Medicine and Rehabilitation

## 2019-08-06 ENCOUNTER — Other Ambulatory Visit: Payer: Self-pay

## 2019-08-06 VITALS — BP 150/74 | HR 73

## 2019-08-06 DIAGNOSIS — M48062 Spinal stenosis, lumbar region with neurogenic claudication: Secondary | ICD-10-CM | POA: Diagnosis not present

## 2019-08-06 DIAGNOSIS — M5416 Radiculopathy, lumbar region: Secondary | ICD-10-CM

## 2019-08-06 MED ORDER — METHYLPREDNISOLONE ACETATE 80 MG/ML IJ SUSP
40.0000 mg | Freq: Once | INTRAMUSCULAR | Status: AC
  Administered 2019-08-06: 40 mg

## 2019-08-06 NOTE — Telephone Encounter (Signed)
Patient stopped by the [facility] office complaining of mucus and draining that she describes "starts from the top of her head and goes all the way down her throat and she wants something to dry it up". Patient was offered appointment by Mel Almond and patient only wanted a pill today. Discussed with patient in exam room after she was screened and temp taken that she was past due for follow up with Dr. Nelva Bush.  Patient stated something came up and she was not able to keep appointment in November.  Informed patient she did need to make follow up appointment.  Patient was given 1 sample box of Allegra to take one daily and 3 samples of Mucinex 600 mg to take one twice daily and instructed to increase fluids.  Patient had previously been taking Benadryl and saline spray for her congestion and draining.  Informed patient to call the office she worsened and to make a follow up appointment with Dr. Nelva Bush.  Patient voiced understanding.

## 2019-08-06 NOTE — Progress Notes (Signed)
 .  Numeric Pain Rating Scale and Functional Assessment Average Pain 8   In the last MONTH (on 0-10 scale) has pain interfered with the following?  1. General activity like being  able to carry out your everyday physical activities such as walking, climbing stairs, carrying groceries, or moving a chair?  Rating(8)   +Driver, -BT, -Dye Allergies.  

## 2019-08-10 ENCOUNTER — Telehealth: Payer: Self-pay | Admitting: Physical Medicine and Rehabilitation

## 2019-08-10 NOTE — Telephone Encounter (Signed)
Called patient to advise- call could not be completed at this time; unable to leave message.

## 2019-08-10 NOTE — Telephone Encounter (Signed)
She can stay hydrated and take any blood pressure medication as normal.  If this is related to the steroid injection at all it will be self-limiting.  She is only 5 days out from the injection.  If this were to keep fluctuating she should see her primary care physician.

## 2019-08-11 NOTE — Telephone Encounter (Signed)
Called patient again and got same message that call cannot be completed at this time.

## 2019-08-12 NOTE — Telephone Encounter (Signed)
Called a third time. Call cannot be completed at this time.

## 2019-08-17 NOTE — Progress Notes (Signed)
Pier Ramos - 74 y.o. female MRN [id number]  Date of birth: 1945-12-28  Office Visit Note: Visit Date: 08/06/2019 PCP: Donald Prose, MD Referred by: Donald Prose, MD  Subjective: Chief Complaint  Patient presents with  . Lower Back - Pain  . Left Leg - Pain   HPI:  Chelene Sferrazza is a 74 y.o. female who comes in today For planned left L4-5 interlaminar procedure injection.  Patient continues to have left radicular leg pain.  Some relief with prior L3 transforaminal injection.  MRI showing severe multifactorial stenosis at L2-3 and L3-4.  She is seeing Dr. Christella Noa in the past for evaluation to the emergency department.  This seems to be related more to the lower lumbar region than the lower thoracic region where she also has stenosis.  If she does not get much relief from that she really needs to seek surgical referral at that point.  The patient has failed conservative care including home exercise, medications, time and activity modification.  This injection will be diagnostic and hopefully therapeutic.  Please see requesting physician notes for further details and justification.   ROS Otherwise per HPI.  Assessment & Plan: Visit Diagnoses:  1. Spinal stenosis of lumbar region with neurogenic claudication   2. Lumbar radiculopathy     Plan: No additional findings.   Meds & Orders:  Meds ordered this encounter  Medications  . methylPREDNISolone acetate (DEPO-MEDROL) injection 40 mg    Orders Placed This Encounter  Procedures  . XR C-ARM NO REPORT  . Epidural Steroid injection    Follow-up: Return if symptoms worsen or fail to improve, for May need surgical referral for decompression.   Procedures: No procedures performed  Lumbar Epidural Steroid Injection - Interlaminar Approach with Fluoroscopic Guidance  Patient: Annaalicia Vielman      Date of Birth: June 08, 1946 MRN: [id number] PCP: Donald Prose, MD      Visit Date: 08/06/2019   Universal Protocol:     Consent Given By: the  patient  Position: PRONE  Additional Comments: Vital signs were monitored before and after the procedure. Patient was prepped and draped in the usual sterile fashion. The correct patient, procedure, and site was verified.   Injection Procedure Details:  Procedure Site One Meds Administered:  Meds ordered this encounter  Medications  . methylPREDNISolone acetate (DEPO-MEDROL) injection 40 mg     Laterality: Left  Location/Site:  L4-L5  Needle size: 20 G  Needle type: Tuohy  Needle Placement: Paramedian epidural  Findings:   -Comments: Excellent flow of contrast into the epidural space.  Procedure Details: Using a paramedian approach from the side mentioned above, the region overlying the inferior lamina was localized under fluoroscopic visualization and the soft tissues overlying this structure were infiltrated with 4 ml. of 1% Lidocaine without Epinephrine. The Tuohy needle was inserted into the epidural space using a paramedian approach.   The epidural space was localized using loss of resistance along with lateral and bi-planar fluoroscopic views.  After negative aspirate for air, blood, and CSF, a 2 ml. volume of Isovue-250 was injected into the epidural space and the flow of contrast was observed. Radiographs were obtained for documentation purposes.    The injectate was administered into the level noted above.   Additional Comments:  The patient tolerated the procedure well Dressing: 2 x 2 sterile gauze and Band-Aid    Post-procedure details: Patient was observed during the procedure. Post-procedure instructions were reviewed.  Patient left the clinic in stable condition.  Clinical History: MRI LUMBAR SPINE WITHOUT CONTRAST  FINDINGS:   Alignment: Trace anterolisthesis L3 on L4 and 0.5 cm anterolisthesis L4 on L5 is due to facet arthropathy.  Vertebrae: No fracture, evidence of discitis, or bone lesion.  Conus medullaris and cauda equina:  Conus extends to the L1 level. Conus and cauda equina appear normal. Although it is at the superior margin of the scan, there appears to be edema within the distal cord at T10-11.  Paraspinal and other soft tissues: Small T2 hyperintense lesions in the right kidney are most consistent with cysts.  Disc levels:  T10-11 and T11-12 are imaged in the sagittal plane only. Disc bulge, facet degenerative disease and ligamentum flavum thickening are seen at T10-11. There is central canal stenosis and bilateral foraminal narrowing.  T12-L1: Mild facet degenerative disease. Otherwise negative.  L1-2: There is ligamentum flavum thickening and a shallow disc bulge. Mild central canal narrowing is present. Foramina open.  L2-3: Bulky ligamentum flavum thickening, moderate facet arthropathy and a shallow disc bulge. There is moderately severe to severe central canal stenosis and bilateral subarticular recess narrowing.  L4-5: Advanced bilateral facet degenerative change and ligamentum flavum thickening. The disc is uncovered with a shallow bulge. Moderately severe to severe central canal stenosis and lateral recess narrowing. There is also mild to moderate bilateral foraminal narrowing.  L4-5: The disc is uncovered without bulging. Advanced facet degenerative changes present. There is mild central canal and bilateral subarticular recess narrowing. Foramina are open.  L5-S1: Facet degenerative change and a shallow right paracentral protrusion. The central canal is open. Moderate to moderately severe foraminal narrowing is worse on the right.  IMPRESSION: Possible signal abnormality in the distal cord at T10-11 where there appears to be marked central canal stenosis. This level is imaged in the sagittal plane only and is at the superior margin of the scan. Recommend thoracic spine MRI without contrast for further evaluation.  Congenitally narrow central canal in the mid lumbar  spine.  Moderately severe to severe central canal and bilateral lateral recess narrowing L2-3 and L3-4.  Mild central canal and bilateral subarticular recess narrowing at L4-5.  Moderate to moderately severe foraminal narrowing at L5-S1 is worse on the right. The central canal is open.  Critical Value/emergent results were called by telephone at the time of interpretation on 04/17/2019 at 4:22 pm to providerDr. Francia Greaves, Who verbally acknowledged these results.   Electronically Signed By: Inge Rise M.D. On: 04/17/2019 16:25 ------ MRI THORACIC SPINE WITHOUT CONTRAST   Alignment: Exaggerated thoracic kyphosis. No significant spondylolisthesis.   Disc levels:  At T10-T11, there is a small disc bulge with superimposed small right center disc protrusion. Facet arthrosis. Prominent ligamentum flavum hypertrophy (particularly on the left). Resultant severe spinal canal stenosis with spinal cord flattening. Question subtle T2 hyperintensity within the spinal cord at this level which may reflect myelopathic change.  These results were called by telephone at the time of interpretation on 04/17/2019 at 8:09 pm to provider [facility] , who verbally acknowledged these results.  IMPRESSION: Significantly motion degraded examination.  At T10-T11, small disc bulge with superimposed small right center disc protrusion. Facet arthrosis. Prominent ligamentum flavum hypertrophy (particularly on the left). Resultant severe spinal canal stenosis with spinal cord flattening. Question subtle T2 hyperintensity within the spinal cord at this level, which may reflect myelopathic.  No significant spinal canal stenosis at the remaining levels. No more than mild neural foraminal narrowing within the thoracic spine.   Electronically Signed   By:  Kellie Simmering   On: 04/17/2019 20:11     Objective:  VS:  HT:    WT:   BMI:     BP:(!) 150/74  HR:73bpm  TEMP: ( )   RESP:  Physical Exam  Ortho Exam Imaging: No results found.

## 2019-08-17 NOTE — Progress Notes (Signed)
Santia Hartell - 74 y.o. female MRN [id number]  Date of birth: 1946-07-09  Office Visit Note: Visit Date: 05/26/2019 PCP: Donald Prose, MD Referred by: Donald Prose, MD  Subjective: Chief Complaint  Patient presents with  . Lower Back - Pain  . Left Thigh - Pain   HPI:  Loice Godbee is a 74 y.o. female who comes in today At the request of Dr. Eduard Roux for lumbar interventional spine procedure.  She has an interesting history that she reports as well as what is in the chart.  I reviewed many notes from Dr. Christella Noa when she was admitted through the emergency department.  She is having left hip and thigh pain radicular nature.  MRI of the lumbar spine showed severe stenosis at L2-3 and L3-4.  This is multifactorial.  It also however did show an area of stenosis at T10-11 with possible signal cord changes.  This was thoroughly evaluated while she was there and she has seen Dr. Christella Noa about this.  We will complete diagnostic and hopefully therapeutic left L3 transforaminal injection.  Patient has a lot of anxiety about the injection and her condition which we went over at great length today.  We will see her back as needed depending on how she does with the injection.  She likely is a surgical candidate unfortunately.  ROS Otherwise per HPI.  Assessment & Plan: Visit Diagnoses:  1. Lumbar radiculopathy   2. Spinal stenosis of lumbar region with neurogenic claudication     Plan: No additional findings.   Meds & Orders:  Meds ordered this encounter  Medications  . betamethasone acetate-betamethasone sodium phosphate (CELESTONE) injection 12 mg    Orders Placed This Encounter  Procedures  . XR C-ARM NO REPORT  . Epidural Steroid injection    Follow-up: Return if symptoms worsen or fail to improve.   Procedures: No procedures performed  Lumbosacral Transforaminal Epidural Steroid Injection - Sub-Pedicular Approach with Fluoroscopic Guidance  Patient: Jahlani Domingo      Date of Birth:  October 04, 1945 MRN: [id number] PCP: Donald Prose, MD      Visit Date: 05/26/2019   Universal Protocol:    Date/Time: 05/26/2019  Consent Given By: the patient  Position: PRONE  Additional Comments: Vital signs were monitored before and after the procedure. Patient was prepped and draped in the usual sterile fashion. The correct patient, procedure, and site was verified.   Injection Procedure Details:  Procedure Site One Meds Administered:  Meds ordered this encounter  Medications  . betamethasone acetate-betamethasone sodium phosphate (CELESTONE) injection 12 mg    Laterality: Left  Location/Site:  L3-L4  Needle size: 22 G  Needle type: Spinal  Needle Placement: Transforaminal  Findings:    -Comments: Excellent flow of contrast along the nerve and into the epidural space.  Procedure Details: After squaring off the end-plates to get a true AP view, the C-arm was positioned so that an oblique view of the foramen as noted above was visualized. The target area is just inferior to the "nose of the scotty dog" or sub pedicular. The soft tissues overlying this structure were infiltrated with 2-3 ml. of 1% Lidocaine without Epinephrine.  The spinal needle was inserted toward the target using a "trajectory" view along the fluoroscope beam.  Under AP and lateral visualization, the needle was advanced so it did not puncture dura and was located close the 6 O'Clock position of the pedical in AP tracterory. Biplanar projections were used to confirm position. Aspiration was confirmed to  be negative for CSF and/or blood. A 1-2 ml. volume of Isovue-250 was injected and flow of contrast was noted at each level. Radiographs were obtained for documentation purposes.   After attaining the desired flow of contrast documented above, a 0.5 to 1.0 ml test dose of 0.25% Marcaine was injected into each respective transforaminal space.  The patient was observed for 90 seconds post injection.  After no  sensory deficits were reported, and normal lower extremity motor function was noted,   the above injectate was administered so that equal amounts of the injectate were placed at each foramen (level) into the transforaminal epidural space.   Additional Comments:  The patient tolerated the procedure well Dressing: 2 x 2 sterile gauze and Band-Aid    Post-procedure details: Patient was observed during the procedure. Post-procedure instructions were reviewed.  Patient left the clinic in stable condition.     Clinical History: MRI LUMBAR SPINE WITHOUT CONTRAST  FINDINGS:   Alignment: Trace anterolisthesis L3 on L4 and 0.5 cm anterolisthesis L4 on L5 is due to facet arthropathy.  Vertebrae: No fracture, evidence of discitis, or bone lesion.  Conus medullaris and cauda equina: Conus extends to the L1 level. Conus and cauda equina appear normal. Although it is at the superior margin of the scan, there appears to be edema within the distal cord at T10-11.  Paraspinal and other soft tissues: Small T2 hyperintense lesions in the right kidney are most consistent with cysts.  Disc levels:  T10-11 and T11-12 are imaged in the sagittal plane only. Disc bulge, facet degenerative disease and ligamentum flavum thickening are seen at T10-11. There is central canal stenosis and bilateral foraminal narrowing.  T12-L1: Mild facet degenerative disease. Otherwise negative.  L1-2: There is ligamentum flavum thickening and a shallow disc bulge. Mild central canal narrowing is present. Foramina open.  L2-3: Bulky ligamentum flavum thickening, moderate facet arthropathy and a shallow disc bulge. There is moderately severe to severe central canal stenosis and bilateral subarticular recess narrowing.  L4-5: Advanced bilateral facet degenerative change and ligamentum flavum thickening. The disc is uncovered with a shallow bulge. Moderately severe to severe central canal stenosis and  lateral recess narrowing. There is also mild to moderate bilateral foraminal narrowing.  L4-5: The disc is uncovered without bulging. Advanced facet degenerative changes present. There is mild central canal and bilateral subarticular recess narrowing. Foramina are open.  L5-S1: Facet degenerative change and a shallow right paracentral protrusion. The central canal is open. Moderate to moderately severe foraminal narrowing is worse on the right.  IMPRESSION: Possible signal abnormality in the distal cord at T10-11 where there appears to be marked central canal stenosis. This level is imaged in the sagittal plane only and is at the superior margin of the scan. Recommend thoracic spine MRI without contrast for further evaluation.  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  Moderately severe to severe central canal and bilateral lateral recess narrowing L2-3 and L3-4.  Mild central canal and bilateral subarticular recess narrowing at L4-5.  Moderate to moderately severe foraminal narrowing at L5-S1 is worse on the right. The central canal is open.  Critical Value/emergent results were called by telephone at the time of interpretation on 04/17/2019 at 4:22 pm to providerDr. Francia Greaves, Who verbally acknowledged these results.   Electronically Signed By: Inge Rise M.D. On: 04/17/2019 16:25 ------ MRI THORACIC SPINE WITHOUT CONTRAST   Alignment: Exaggerated thoracic kyphosis. No significant spondylolisthesis.   Disc levels:  At T10-T11, there is a  small disc bulge with superimposed small right center disc protrusion. Facet arthrosis. Prominent ligamentum flavum hypertrophy (particularly on the left). Resultant severe spinal canal stenosis with spinal cord flattening. Question subtle T2 hyperintensity within the spinal cord at this level which may reflect myelopathic change.  These results were called by telephone at the time of  interpretation on 04/17/2019 at 8:09 pm to provider [facility] , who verbally acknowledged these results.  IMPRESSION: Significantly motion degraded examination.  At T10-T11, small disc bulge with superimposed small right center disc protrusion. Facet arthrosis. Prominent ligamentum flavum hypertrophy (particularly on the left). Resultant severe spinal canal stenosis with spinal cord flattening. Question subtle T2 hyperintensity within the spinal cord at this level, which may reflect myelopathic.  No significant spinal canal stenosis at the remaining levels. No more than mild neural foraminal narrowing within the thoracic spine.   Electronically Signed   By: Kellie Simmering   On: 04/17/2019 20:11     Objective:  VS:  HT:    WT:   BMI:     BP:(!) 167/87  HR:(!) 102bpm  TEMP: ( )  RESP:  Physical Exam  Ortho Exam Imaging: No results found.

## 2019-08-17 NOTE — Procedures (Signed)
Lumbosacral Transforaminal Epidural Steroid Injection - Sub-Pedicular Approach with Fluoroscopic Guidance  Patient: Rachel Crane      Date of Birth: 20-Apr-1946 MRN: [id number] PCP: Donald Prose, MD      Visit Date: 05/26/2019   Universal Protocol:    Date/Time: 05/26/2019  Consent Given By: the patient  Position: PRONE  Additional Comments: Vital signs were monitored before and after the procedure. Patient was prepped and draped in the usual sterile fashion. The correct patient, procedure, and site was verified.   Injection Procedure Details:  Procedure Site One Meds Administered:  Meds ordered this encounter  Medications  . betamethasone acetate-betamethasone sodium phosphate (CELESTONE) injection 12 mg    Laterality: Left  Location/Site:  L3-L4  Needle size: 22 G  Needle type: Spinal  Needle Placement: Transforaminal  Findings:    -Comments: Excellent flow of contrast along the nerve and into the epidural space.  Procedure Details: After squaring off the end-plates to get a true AP view, the C-arm was positioned so that an oblique view of the foramen as noted above was visualized. The target area is just inferior to the "nose of the scotty dog" or sub pedicular. The soft tissues overlying this structure were infiltrated with 2-3 ml. of 1% Lidocaine without Epinephrine.  The spinal needle was inserted toward the target using a "trajectory" view along the fluoroscope beam.  Under AP and lateral visualization, the needle was advanced so it did not puncture dura and was located close the 6 O'Clock position of the pedical in AP tracterory. Biplanar projections were used to confirm position. Aspiration was confirmed to be negative for CSF and/or blood. A 1-2 ml. volume of Isovue-250 was injected and flow of contrast was noted at each level. Radiographs were obtained for documentation purposes.   After attaining the desired flow of contrast documented above, a 0.5 to 1.0 ml  test dose of 0.25% Marcaine was injected into each respective transforaminal space.  The patient was observed for 90 seconds post injection.  After no sensory deficits were reported, and normal lower extremity motor function was noted,   the above injectate was administered so that equal amounts of the injectate were placed at each foramen (level) into the transforaminal epidural space.   Additional Comments:  The patient tolerated the procedure well Dressing: 2 x 2 sterile gauze and Band-Aid    Post-procedure details: Patient was observed during the procedure. Post-procedure instructions were reviewed.  Patient left the clinic in stable condition.

## 2019-08-17 NOTE — Procedures (Signed)
Lumbar Epidural Steroid Injection - Interlaminar Approach with Fluoroscopic Guidance  Patient: Rachel Crane      Date of Birth: 1946-07-16 MRN: [id number] PCP: Donald Prose, MD      Visit Date: 08/06/2019   Universal Protocol:     Consent Given By: the patient  Position: PRONE  Additional Comments: Vital signs were monitored before and after the procedure. Patient was prepped and draped in the usual sterile fashion. The correct patient, procedure, and site was verified.   Injection Procedure Details:  Procedure Site One Meds Administered:  Meds ordered this encounter  Medications  . methylPREDNISolone acetate (DEPO-MEDROL) injection 40 mg     Laterality: Left  Location/Site:  L4-L5  Needle size: 20 G  Needle type: Tuohy  Needle Placement: Paramedian epidural  Findings:   -Comments: Excellent flow of contrast into the epidural space.  Procedure Details: Using a paramedian approach from the side mentioned above, the region overlying the inferior lamina was localized under fluoroscopic visualization and the soft tissues overlying this structure were infiltrated with 4 ml. of 1% Lidocaine without Epinephrine. The Tuohy needle was inserted into the epidural space using a paramedian approach.   The epidural space was localized using loss of resistance along with lateral and bi-planar fluoroscopic views.  After negative aspirate for air, blood, and CSF, a 2 ml. volume of Isovue-250 was injected into the epidural space and the flow of contrast was observed. Radiographs were obtained for documentation purposes.    The injectate was administered into the level noted above.   Additional Comments:  The patient tolerated the procedure well Dressing: 2 x 2 sterile gauze and Band-Aid    Post-procedure details: Patient was observed during the procedure. Post-procedure instructions were reviewed.  Patient left the clinic in stable condition.

## 2019-08-24 ENCOUNTER — Telehealth: Payer: Self-pay | Admitting: Physical Medicine and Rehabilitation

## 2019-08-24 NOTE — Telephone Encounter (Signed)
She really needs f/up with Neurosurgeon - She has severe stenosis T10 and also two level severe stenosis in Lumbar. Not much I can do.

## 2019-08-25 NOTE — Telephone Encounter (Signed)
Called patient to advise. "call cannot be completed at this time."

## 2019-08-26 DIAGNOSIS — R195 Other fecal abnormalities: Secondary | ICD-10-CM | POA: Diagnosis not present

## 2019-08-27 ENCOUNTER — Telehealth: Payer: Self-pay | Admitting: Orthopaedic Surgery

## 2019-08-27 NOTE — Telephone Encounter (Signed)
Patient called.   She was checking on the status of a referral that was supposed to be sent to a spine specialist on her behalf.   Call back number: [phone]

## 2019-08-28 DIAGNOSIS — G4733 Obstructive sleep apnea (adult) (pediatric): Secondary | ICD-10-CM | POA: Diagnosis not present

## 2019-08-28 DIAGNOSIS — M4804 Spinal stenosis, thoracic region: Secondary | ICD-10-CM | POA: Diagnosis not present

## 2019-08-28 DIAGNOSIS — R5383 Other fatigue: Secondary | ICD-10-CM | POA: Diagnosis not present

## 2019-08-28 DIAGNOSIS — E876 Hypokalemia: Secondary | ICD-10-CM | POA: Diagnosis not present

## 2019-08-28 DIAGNOSIS — I10 Essential (primary) hypertension: Secondary | ICD-10-CM | POA: Diagnosis not present

## 2019-08-28 NOTE — Telephone Encounter (Signed)
I tried calling pt back and automated voice came on saying we're sorry your call can not be completed at this time please try again later. I tried twice already, will try again later.

## 2019-09-07 ENCOUNTER — Telehealth: Payer: Self-pay | Admitting: Radiology

## 2019-09-07 NOTE — Telephone Encounter (Signed)
Pt came into office inquiring about her referral, I spoke with pt and told her the referral was placed on hold per her request d/t wanted to wait to see if injections work, pt stated they did not work so wants to proceed with neurosurgery.

## 2019-09-07 NOTE — Telephone Encounter (Signed)
The patients phone number was incorrect in the chart I have corrected it.  Correct number is [phone]

## 2019-09-15 DIAGNOSIS — M47816 Spondylosis without myelopathy or radiculopathy, lumbar region: Secondary | ICD-10-CM | POA: Diagnosis not present

## 2019-09-15 DIAGNOSIS — M48062 Spinal stenosis, lumbar region with neurogenic claudication: Secondary | ICD-10-CM | POA: Diagnosis not present

## 2019-09-15 DIAGNOSIS — M4316 Spondylolisthesis, lumbar region: Secondary | ICD-10-CM | POA: Diagnosis not present

## 2019-10-02 ENCOUNTER — Ambulatory Visit (HOSPITAL_COMMUNITY)
Admission: EM | Admit: 2019-10-02 | Discharge: 2019-10-02 | Disposition: A | Discharge status: Home / Self Care | Payer: Medicare Other | Attending: Emergency Medicine | Admitting: Emergency Medicine

## 2019-10-02 ENCOUNTER — Other Ambulatory Visit: Payer: Self-pay

## 2019-10-02 ENCOUNTER — Telehealth (HOSPITAL_COMMUNITY): Payer: Self-pay

## 2019-10-02 ENCOUNTER — Encounter (HOSPITAL_COMMUNITY): Payer: Self-pay

## 2019-10-02 DIAGNOSIS — M5442 Lumbago with sciatica, left side: Secondary | ICD-10-CM | POA: Diagnosis not present

## 2019-10-02 DIAGNOSIS — G8929 Other chronic pain: Secondary | ICD-10-CM

## 2019-10-02 DIAGNOSIS — R1310 Dysphagia, unspecified: Secondary | ICD-10-CM | POA: Diagnosis not present

## 2019-10-02 LAB — POCT RAPID STREP A: Streptococcus, Group A Screen (Direct): NEGATIVE

## 2019-10-02 MED ORDER — OMEPRAZOLE 20 MG PO CPDR: 20.0000 mg | DELAYED_RELEASE_CAPSULE | Freq: Two times a day (BID) | ORAL | 0 refills | Status: DC

## 2019-10-02 MED ORDER — TIZANIDINE HCL 4 MG PO TABS: 6.0000 mg | ORAL_TABLET | Freq: Four times a day (QID) | ORAL | 0 refills | Status: DC | PRN

## 2019-10-02 MED ORDER — KETOROLAC TROMETHAMINE 30 MG/ML IJ SOLN
INTRAMUSCULAR | Status: AC
  Filled 2019-10-02: qty 1

## 2019-10-02 MED ORDER — CELECOXIB 100 MG PO CAPS: 100.0000 mg | ORAL_CAPSULE | Freq: Two times a day (BID) | ORAL | 0 refills | Status: AC

## 2019-10-02 MED ORDER — LORATADINE 10 MG PO TABS: 10.0000 mg | ORAL_TABLET | Freq: Every day | ORAL | 0 refills | Status: DC

## 2019-10-02 MED ORDER — KETOROLAC TROMETHAMINE 30 MG/ML IJ SOLN
30.0000 mg | Freq: Once | INTRAMUSCULAR | Status: AC
  Administered 2019-10-02: 30 mg via INTRAMUSCULAR

## 2019-10-02 MED ORDER — CELECOXIB 100 MG PO CAPS: 100.0000 mg | ORAL_CAPSULE | Freq: Two times a day (BID) | ORAL | 0 refills | Status: DC

## 2019-10-02 NOTE — Discharge Instructions (Signed)
Back pain: We gave you an injection of Toradol today, this should begin working in 30 to 40 minutes I have refilled tizanidine, may take 1-1.5 tablets, may cause drowsiness, do not drive or work after taking You may try using Celebrex/celecoxib as needed twice daily for back pain-do not pair with additional ibuprofen, take with food on your stomach, do not begin until tomorrow Follow-up with pain management and neurosurgery as planned   Sore throat: Begin daily Claritin over the next 1 to 2 weeks Begin omeprazole/Prilosec twice daily with meals to help with any acid contributing to symptoms If you continue to have sensation of something in your throat please follow-up with primary care or gastroenterology for further evaluation at this  Please follow-up if any symptoms not improving or worsening

## 2019-10-02 NOTE — ED Triage Notes (Signed)
Pt presents with chronic back pain.  Pt also presents with irritated (not painful) throat that she believes is from post nasal drip X 2 weeks.

## 2019-10-02 NOTE — ED Provider Notes (Addendum)
[place]    CSN: [id number] Arrival date & time: 10/02/19  1201      History   Chief Complaint Chief Complaint  Patient presents with  . Back Pain  . Throat Irritation    HPI Rachel Crane is a 74 y.o. female history of hypertension, sleep apnea, asthma, presenting today for evaluation of throat irritation and back pain.  Patient notes over the past 2 weeks she has had a sensation of something stuck in her throat.  She denies associated pain or soreness.  Feels as if there is swelling present especially on the left side near her tonsils.  She denies any fevers.  Does report associated postnasal drainage for the past 2 weeks.  She is on Flonase.  Previously was taking Claritin, but is not currently taking.  She denies any cough, chest pain or shortness of breath.  She does report frequent acid symptoms and feels burning and indigestion.  This worsens when she is taking Tylenol for her back.  She has been using salt water gargles and warm compresses without relief.  Patient has known spinal stenosis and L4/L5 of her back.  She has plans to follow-up with neurosurgeon and pain management soon.  She has been using Tylenol 1000 mg every 6 hours, but does not fully control her pain.  Has previously tried tizanidine, initially stated helped some, but later on stated this is not helped her in the past for her back, but she is interested in trying again.  Denies history of kidney issues.  Reports borderline diabetes.  On chart review appears that she had spinal injection of Depo-Medrol on 1/21 without significant improvement.  Pain radiates into left side.  HPI  Past Medical History:  Diagnosis Date  . Abnormal stress ECG 10/06/2018  . Aortic valve sclerosis 10/06/2018  . Arthritis   . Asthma   . Bilateral carotid bruits 10/06/2018  . Headache    H/O bad HA, since using CPAP- she has had improvement with that issue   . Hypertension   . Laboratory examination 10/06/2018  . OSA on  CPAP    settting - 8, uses CPAP q night   . Shingles     Patient Active Problem List   Diagnosis Date Noted  . Chronic pain of left knee 06/26/2019  . Lumbar radiculopathy 06/26/2019  . Left-sided low back pain with left-sided sciatica 06/26/2019  . Thoracic spinal stenosis 04/18/2019  . Seasonal allergies 12/04/2018  . Allergic rhinoconjunctivitis 10/10/2018  . Moderate persistent asthma with acute exacerbation 10/10/2018  . Pruritus 10/10/2018  . Aortic valve sclerosis 10/06/2018  . Bilateral carotid bruits 10/06/2018  . Abnormal stress ECG 10/06/2018  . Laboratory examination 10/06/2018  . Palpitations 10/06/2018  . Total knee replacement status 09/19/2017  . Sprain of anterior talofibular ligament of right ankle 12/13/2016    Past Surgical History:  Procedure Laterality Date  . CHOLECYSTECTOMY  1998  . TOTAL KNEE ARTHROPLASTY Right 09/19/2017   Procedure: RIGHT TOTAL KNEE ARTHROPLASTY;  Surgeon: Leandrew Koyanagi, MD;  Location: [facility];  Service: Orthopedics;  Laterality: Right;  . TUBAL LIGATION      OB History   No obstetric history on file.      Home Medications    Prior to Admission medications   Medication Sig Start Date End Date Taking? Authorizing Provider  aspirin EC 81 MG tablet Take 1 tablet (81 mg total) by mouth daily. 05/12/18   Clent Demark, PA-C  budesonide-formoterol [facility]) 80-4.5 MCG/ACT  inhaler 2 Puffs 2 times daily Patient taking differently: Inhale 2 puffs into the lungs daily as needed (shortness of breath).  01/15/19   Kennith Gain, MD  celecoxib (CELEBREX) 100 MG capsule Take 1 capsule (100 mg total) by mouth 2 (two) times daily for 10 days. 10/02/19 10/12/19  Eliska Hamil C, PA-C  Cholecalciferol (VITAMIN D3) 25 MCG (1000 UT) CAPS Take by mouth.    [provider]  cyclobenzaprine (FLEXERIL) 5 MG tablet Take 1 tablet (5 mg total) by mouth 3 (three) times daily as needed for muscle spasms. 06/26/19   Leandrew Koyanagi, MD   fluticasone (FLONASE) 50 MCG/ACT nasal spray Place 2 sprays into both nostrils daily. 10/10/18   Dara Hoyer, FNP  HYDROCHLOROTHIAZIDE PO Take by mouth.    [provider]  latanoprost (XALATAN) 0.005 % ophthalmic solution Place 1 drop into both eyes at bedtime.  12/05/17   [provider]  loratadine (CLARITIN) 10 MG tablet Take 1 tablet (10 mg total) by mouth daily. 10/02/19   Suellyn Meenan C, PA-C  losartan (COZAAR) 100 MG tablet Take 1 tablet (100 mg total) by mouth daily. 12/04/18   [name], NP  meloxicam (MOBIC) 15 MG tablet Take 1 tablet (15 mg total) by mouth daily. Take with food 07/28/19   Magnus Sinning, MD  Multiple Vitamins-Minerals (WOMENS 50+ [place] VITAMIN/MIN PO) Take by mouth daily.    [provider]  nitroGLYCERIN (NITROSTAT) 0.4 MG SL tablet Place 1 tablet (0.4 mg total) under the tongue every 5 (five) minutes as needed for chest pain. 05/12/18   Clent Demark, PA-C  omeprazole (PRILOSEC) 20 MG capsule Take 1 capsule (20 mg total) by mouth 2 (two) times daily before a meal. 10/02/19   Subrina Vecchiarelli C, PA-C  oxyCODONE (OXY IR/ROXICODONE) 5 MG immediate release tablet Take 1 tablet (5 mg total) by mouth daily as needed for severe pain. 06/26/19   Leandrew Koyanagi, MD  pravastatin (PRAVACHOL) 10 MG tablet Take 10 mg by mouth at bedtime. 03/31/19   [provider]  tiZANidine (ZANAFLEX) 4 MG tablet Take 1.5 tablets (6 mg total) by mouth every 6 (six) hours as needed for muscle spasms. 10/02/19   Aahana Elza, Elesa Hacker, PA-C    Family History Family History  Problem Relation Age of Onset  . Breast cancer Sister     Social History Social History   Tobacco Use  . Smoking status: Never Smoker  . Smokeless tobacco: Never Used  Substance Use Topics  . Alcohol use: Yes    Comment: sometimes wine  . Drug use: No     Allergies   Patient has no known allergies.   Review of Systems Review of Systems  Constitutional: Negative for  activity change, appetite change, chills, fatigue and fever.  HENT: Positive for postnasal drip and trouble swallowing. Negative for congestion, ear pain, rhinorrhea, sinus pressure and sore throat.   Eyes: Negative for discharge and redness.  Respiratory: Negative for cough, chest tightness and shortness of breath.   Cardiovascular: Negative for chest pain.  Gastrointestinal: Negative for abdominal pain, diarrhea, nausea and vomiting.  Musculoskeletal: Positive for back pain. Negative for myalgias.  Skin: Negative for rash.  Neurological: Negative for dizziness, light-headedness and headaches.     Physical Exam Triage Vital Signs ED Triage Vitals  Enc Vitals Group     BP 10/02/19 1228 126/65     Pulse Rate 10/02/19 1228 76     Resp 10/02/19 1228 17  Temp 10/02/19 1228 98.6 F (37 C)     Temp Source 10/02/19 1228 Oral     SpO2 10/02/19 1228 100 %     Weight --      Height --      Head Circumference --      Peak Flow --      Pain Score 10/02/19 1229 5     Pain Loc --      Pain Edu? --      Excl. in [place]? --    No data found.  Updated Vital Signs BP 126/65 (BP Location: Left Arm)   Pulse 76   Temp 98.6 F (37 C) (Oral)   Resp 17   SpO2 100%   Visual Acuity Right Eye Distance:   Left Eye Distance:   Bilateral Distance:    Right Eye Near:   Left Eye Near:    Bilateral Near:     Physical Exam Vitals and nursing note reviewed.  Constitutional:      Appearance: She is well-developed.     Comments: No acute distress  HENT:     Head: Normocephalic and atraumatic.     Nose: Nose normal.     Mouth/Throat:     Comments: Bilateral tonsils moderately enlarged without erythema, isolated white spots to right anterior tonsil, questionable exudate versus stone Posterior pharynx patent without erythema, uvula midline, no soft palate swelling Eyes:     Conjunctiva/sclera: Conjunctivae normal.  Neck:     Comments: No overlying swelling or erythema, full active range of  motion, no lymphadenopathy present, similar size of tonsillar area noted to left side and right side, no unilateral swelling Cardiovascular:     Rate and Rhythm: Normal rate.  Pulmonary:     Effort: Pulmonary effort is normal. No respiratory distress.  Abdominal:     General: There is no distension.  Musculoskeletal:        General: Normal range of motion.     Cervical back: Neck supple.     Comments: Tenderness to palpation of lumbar spine midline  Patient able to ambulate from chair to exam table without assistance or significant abnormality  Skin:    General: Skin is warm and dry.  Neurological:     Mental Status: She is alert and oriented to person, place, and time.      UC Treatments / Results  Labs (all labs ordered are listed, but only abnormal results are displayed) Labs Reviewed  CULTURE, GROUP A STREP [facility])  POCT RAPID STREP A    EKG   Radiology No results found.  Procedures Procedures (including critical care time)  Medications Ordered in UC Medications  ketorolac (TORADOL) 30 MG/ML injection 30 mg (30 mg Intramuscular Given 10/02/19 1348)    Initial Impression / Assessment and Plan / UC Course  I have reviewed the triage vital signs and the nursing notes.  Pertinent labs & imaging results that were available during my care of the patient were reviewed by me and considered in my medical decision making (see chart for details).    Strep test negative, tonsils do appear slightly enlarged, but do not appear erythematous or suggestive of infection.  Patient denies pain with swallowing, main concern is sensation of something present.  Will have patient reinitiate Claritin to help with any postnasal drainage, initiating on omeprazole for GERD, recommended if continuing to have the sensation to follow-up with gastroenterology or possibly ENT.  Chronic back pain-has known spinal stenosis.  Similar nature.  Will  provide Toradol in clinic today.  Will refill  tizanidine and try at higher dose of 6 mg, Celebrex as alternative to Tylenol.  Continue to follow-up with neurosurgery/pain management as planned.  30-40 minutes of time spent directly with patient evaluating and discussing treatment options.   Discussed strict return precautions. Patient verbalized understanding and is agreeable with plan.   Final Clinical Impressions(s) / UC Diagnoses   Final diagnoses:  Chronic left-sided low back pain with left-sided sciatica  Dysphagia, unspecified type     Discharge Instructions     Back pain: We gave you an injection of Toradol today, this should begin working in 30 to 40 minutes I have refilled tizanidine, may take 1-1.5 tablets, may cause drowsiness, do not drive or work after taking You may try using Celebrex/celecoxib as needed twice daily for back pain-do not pair with additional ibuprofen, take with food on your stomach, do not begin until tomorrow Follow-up with pain management and neurosurgery as planned   Sore throat: Begin daily Claritin over the next 1 to 2 weeks Begin omeprazole/Prilosec twice daily with meals to help with any acid contributing to symptoms If you continue to have sensation of something in your throat please follow-up with primary care or gastroenterology for further evaluation at this  Please follow-up if any symptoms not improving or worsening    ED Prescriptions    Medication Sig Dispense Auth. Provider   loratadine (CLARITIN) 10 MG tablet Take 1 tablet (10 mg total) by mouth daily. 30 tablet Shaw Dobek C, PA-C   omeprazole (PRILOSEC) 20 MG capsule Take 1 capsule (20 mg total) by mouth 2 (two) times daily before a meal. 30 capsule Hughey Rittenberry C, PA-C   tiZANidine (ZANAFLEX) 4 MG tablet  (Status: Discontinued) Take 1.5 tablets (6 mg total) by mouth every 6 (six) hours as needed for muscle spasms. 30 tablet Mayia Megill C, PA-C   celecoxib (CELEBREX) 100 MG capsule Take 1 capsule (100 mg total) by  mouth 2 (two) times daily for 10 days. 30 capsule Remijio Holleran C, PA-C   tiZANidine (ZANAFLEX) 4 MG tablet Take 1.5 tablets (6 mg total) by mouth every 6 (six) hours as needed for muscle spasms. 45 tablet Izzac Rockett, [place] C, PA-C     I have reviewed the PDMP during this encounter.   Joneen Caraway, [place] C, PA-C 10/02/19 1349    Raivyn Kabler, [place] C, PA-C 10/02/19 1350

## 2019-10-04 LAB — CULTURE, GROUP A STREP (THRC)

## 2019-10-08 DIAGNOSIS — I10 Essential (primary) hypertension: Secondary | ICD-10-CM | POA: Diagnosis not present

## 2019-10-08 DIAGNOSIS — Z6833 Body mass index (BMI) 33.0-33.9, adult: Secondary | ICD-10-CM | POA: Diagnosis not present

## 2019-10-08 DIAGNOSIS — G4700 Insomnia, unspecified: Secondary | ICD-10-CM | POA: Diagnosis not present

## 2019-10-08 DIAGNOSIS — R030 Elevated blood-pressure reading, without diagnosis of hypertension: Secondary | ICD-10-CM | POA: Diagnosis not present

## 2019-10-08 DIAGNOSIS — M47816 Spondylosis without myelopathy or radiculopathy, lumbar region: Secondary | ICD-10-CM | POA: Diagnosis not present

## 2019-10-08 DIAGNOSIS — E7800 Pure hypercholesterolemia, unspecified: Secondary | ICD-10-CM | POA: Diagnosis not present

## 2019-10-08 DIAGNOSIS — E1169 Type 2 diabetes mellitus with other specified complication: Secondary | ICD-10-CM | POA: Diagnosis not present

## 2019-10-08 DIAGNOSIS — M48062 Spinal stenosis, lumbar region with neurogenic claudication: Secondary | ICD-10-CM | POA: Diagnosis not present

## 2019-10-21 DIAGNOSIS — M48062 Spinal stenosis, lumbar region with neurogenic claudication: Secondary | ICD-10-CM | POA: Diagnosis not present

## 2019-10-21 DIAGNOSIS — Z6832 Body mass index (BMI) 32.0-32.9, adult: Secondary | ICD-10-CM | POA: Diagnosis not present

## 2019-10-21 DIAGNOSIS — R030 Elevated blood-pressure reading, without diagnosis of hypertension: Secondary | ICD-10-CM | POA: Diagnosis not present

## 2019-11-04 DIAGNOSIS — G4700 Insomnia, unspecified: Secondary | ICD-10-CM | POA: Diagnosis not present

## 2019-11-04 DIAGNOSIS — E7800 Pure hypercholesterolemia, unspecified: Secondary | ICD-10-CM | POA: Diagnosis not present

## 2019-11-04 DIAGNOSIS — E1169 Type 2 diabetes mellitus with other specified complication: Secondary | ICD-10-CM | POA: Diagnosis not present

## 2019-11-04 DIAGNOSIS — J45909 Unspecified asthma, uncomplicated: Secondary | ICD-10-CM | POA: Diagnosis not present

## 2019-11-04 DIAGNOSIS — I10 Essential (primary) hypertension: Secondary | ICD-10-CM | POA: Diagnosis not present

## 2019-11-05 DIAGNOSIS — Z1159 Encounter for screening for other viral diseases: Secondary | ICD-10-CM | POA: Diagnosis not present

## 2019-11-06 DIAGNOSIS — R195 Other fecal abnormalities: Secondary | ICD-10-CM | POA: Diagnosis not present

## 2019-11-06 DIAGNOSIS — K5730 Diverticulosis of large intestine without perforation or abscess without bleeding: Secondary | ICD-10-CM | POA: Diagnosis not present

## 2019-11-10 DIAGNOSIS — K5730 Diverticulosis of large intestine without perforation or abscess without bleeding: Secondary | ICD-10-CM | POA: Diagnosis not present

## 2019-11-10 DIAGNOSIS — R195 Other fecal abnormalities: Secondary | ICD-10-CM | POA: Diagnosis not present

## 2019-11-18 DIAGNOSIS — M48062 Spinal stenosis, lumbar region with neurogenic claudication: Secondary | ICD-10-CM | POA: Diagnosis not present

## 2019-12-04 ENCOUNTER — Ambulatory Visit: Payer: Self-pay | Admitting: Allergy

## 2019-12-05 ENCOUNTER — Other Ambulatory Visit: Payer: Self-pay | Admitting: Family Medicine

## 2019-12-22 DIAGNOSIS — M47816 Spondylosis without myelopathy or radiculopathy, lumbar region: Secondary | ICD-10-CM | POA: Diagnosis not present

## 2019-12-22 DIAGNOSIS — M48062 Spinal stenosis, lumbar region with neurogenic claudication: Secondary | ICD-10-CM | POA: Diagnosis not present

## 2019-12-22 DIAGNOSIS — M4316 Spondylolisthesis, lumbar region: Secondary | ICD-10-CM | POA: Diagnosis not present

## 2020-01-07 ENCOUNTER — Other Ambulatory Visit: Payer: Self-pay

## 2020-01-07 ENCOUNTER — Ambulatory Visit (INDEPENDENT_AMBULATORY_CARE_PROVIDER_SITE_OTHER): Payer: Medicare Other | Admitting: Allergy

## 2020-01-07 ENCOUNTER — Encounter: Payer: Self-pay | Admitting: Allergy

## 2020-01-07 VITALS — BP 128/60 | HR 68 | Resp 18 | Ht 66.0 in | Wt 207.0 lb

## 2020-01-07 DIAGNOSIS — J3089 Other allergic rhinitis: Secondary | ICD-10-CM | POA: Diagnosis not present

## 2020-01-07 DIAGNOSIS — J4540 Moderate persistent asthma, uncomplicated: Secondary | ICD-10-CM

## 2020-01-07 DIAGNOSIS — K2100 Gastro-esophageal reflux disease with esophagitis, without bleeding: Secondary | ICD-10-CM

## 2020-01-07 DIAGNOSIS — L299 Pruritus, unspecified: Secondary | ICD-10-CM

## 2020-01-07 MED ORDER — FLUTICASONE PROPIONATE 50 MCG/ACT NA SUSP: 2.0000 | Freq: Every day | NASAL | 5 refills | Status: DC

## 2020-01-07 MED ORDER — LORATADINE 10 MG PO TABS: 10.0000 mg | ORAL_TABLET | Freq: Every day | ORAL | 1 refills | Status: DC

## 2020-01-07 MED ORDER — AZELASTINE HCL 0.1 % NA SOLN
2.0000 | Freq: Two times a day (BID) | NASAL | 5 refills | Status: DC
Start: 2020-01-07 — End: 2021-03-02

## 2020-01-07 MED ORDER — PROAIR DIGIHALER 108 MCG/ACT IN AEPB: 1.0000 | INHALATION_SPRAY | RESPIRATORY_TRACT | 0 refills | Status: DC | PRN

## 2020-01-07 MED ORDER — BUDESONIDE-FORMOTEROL FUMARATE 80-4.5 MCG/ACT IN AERO: INHALATION_SPRAY | RESPIRATORY_TRACT | 5 refills | Status: DC

## 2020-01-07 NOTE — Progress Notes (Signed)
Follow-up Note  RE: Rachel Crane MRN: [id number] DOB: 1945/12/06 Date of Office Visit: 01/07/2020   History of present illness: Rachel Crane is a 74 y.o. female presenting today for follow-up of allergic rhinitis, asthma, itching and reflux.  She was last seen in the office on 01/15/2019 by myself.  Since this visit she states she was sick in the hospital for about 4-5 days about 4 months ago due to back issues.  She has spinal stenosis.  She states she is seeing pain management and in therapy.  She does states she has been able to go back to work.   In regards to her allergies she states she needs refills of astelin and claritin.  Currently not reporting any signficant nasal congestion or drainage. With her asthma she states she uses her Symbicort when she needs to for shortness of breath and states this occurs about once a week on average.  She request refill of symbicort as well.   She states when she goes outside in the sun and get hot she gets itchier.  She mixes vaseline with triamcinolone and applies to itch areas and states does help.  She will take claritin as above as needed for itch.  She continues on prilosec for reflux control.   Review of systems: Review of Systems  Constitutional: Negative.   HENT: Negative.   Eyes: Negative.   Respiratory: Positive for shortness of breath. Negative for cough, sputum production and wheezing.   Cardiovascular: Negative.   Gastrointestinal: Negative.   Musculoskeletal: Positive for back pain.  Skin: Positive for itching. Negative for rash.  Neurological: Negative.     All other systems negative unless noted above in HPI  Past medical/social/surgical/family history have been reviewed and are unchanged unless specifically indicated below.  No changes  Medication List: Current Outpatient Medications  Medication Sig Dispense Refill  . aspirin EC 81 MG tablet Take 1 tablet (81 mg total) by mouth daily. 90 tablet 3  . budesonide-formoterol  (SYMBICORT) 80-4.5 MCG/ACT inhaler 2 Puffs 2 times daily 1 Inhaler 5  . celecoxib (CELEBREX) 100 MG capsule Take 100 mg by mouth 2 (two) times daily as needed.    . chlorthalidone (HYGROTON) 25 MG tablet Take 25 mg by mouth every morning.    . Cholecalciferol (VITAMIN D3) 25 MCG (1000 UT) CAPS Take by mouth.    . cyclobenzaprine (FLEXERIL) 5 MG tablet Take 1 tablet (5 mg total) by mouth 3 (three) times daily as needed for muscle spasms. 30 tablet 3  . fluticasone (FLONASE) 50 MCG/ACT nasal spray Place 2 sprays into both nostrils daily. 16 g 5  . gabapentin (NEURONTIN) 300 MG capsule Take 300 mg by mouth 3 (three) times daily.    [place] HYDROCHLOROTHIAZIDE PO Take by mouth.    . latanoprost (XALATAN) 0.005 % ophthalmic solution Place 1 drop into both eyes at bedtime.   10  . loratadine (CLARITIN) 10 MG tablet Take 1 tablet (10 mg total) by mouth daily. 90 tablet 1  . losartan (COZAAR) 100 MG tablet Take 1 tablet (100 mg total) by mouth daily. 30 tablet 3  . meloxicam (MOBIC) 15 MG tablet Take 1 tablet (15 mg total) by mouth daily. Take with food 30 tablet 0  . Multiple Vitamins-Minerals (WOMENS 50+ MULTI VITAMIN/MIN PO) Take by mouth daily.    . nitroGLYCERIN (NITROSTAT) 0.4 MG SL tablet Place 1 tablet (0.4 mg total) under the tongue every 5 (five) minutes as needed for chest pain. 50 tablet 3  .  omeprazole (PRILOSEC) 20 MG capsule Take 1 capsule (20 mg total) by mouth 2 (two) times daily before a meal. 30 capsule 0  . oxyCODONE (OXY IR/ROXICODONE) 5 MG immediate release tablet Take 1 tablet (5 mg total) by mouth daily as needed for severe pain. 20 tablet 0  . potassium chloride SA (KLOR-CON) 20 MEQ tablet Take 20 mEq by mouth daily.    . pravastatin (PRAVACHOL) 10 MG tablet Take 10 mg by mouth at bedtime.    [place] tiZANidine (ZANAFLEX) 4 MG tablet Take 1.5 tablets (6 mg total) by mouth every 6 (six) hours as needed for muscle spasms. 45 tablet 0  . Albuterol Sulfate, sensor, (PROAIR DIGIHALER) 108 (90  Base) MCG/ACT AEPB Inhale 1-2 puffs into the lungs every 4 (four) hours as needed. 3 each 0  . azelastine (ASTELIN) 0.1 % nasal spray Place 2 sprays into both nostrils 2 (two) times daily. 30 mL 5   No current facility-administered medications for this visit.     Known medication allergies: No Known Allergies   Physical examination: Blood pressure 128/60, pulse 68, resp. rate 18, height 5\' 6"  (1.676 m), weight 207 lb (93.9 kg), SpO2 97 %.  General: Alert, interactive, in no acute distress. HEENT: PERRLA, TMs pearly gray, turbinates minimally edematous without discharge, post-pharynx non erythematous. Neck: Supple without lymphadenopathy. Lungs: Clear to auscultation without wheezing, rhonchi or rales. {no increased work of breathing. CV: Normal S1, S2 without murmurs. Abdomen: Nondistended, nontender. Skin: Warm and dry, without lesions or rashes. Extremities:  No clubbing, cyanosis or edema. Neuro:   Grossly intact.  Diagnositics/Labs:  Spirometry: FEV1: 1.42L 72%, FVC: 2.47L 97%, ratio consistent with nonobstructive pattern for age  Assessment and plan:   Allergic Rhinitis: - Continue Flonase 2 sprays each nostril once daily for sinus congestion - Continue Azelastine 0.1% 2 sprays each nostril twice a day for runny nose/post-nasal drip/nasal itch. - Can use both Flonase and Azelastine together if needed - Continue Claritin 10mg  daily.   Asthma - ProAir 2 puffs every 4 hours as needed for cough or wheeze - Use Symbicort 80- 2 puffs twice a day everyday no matter if you feel well or sick  Control goals:   Full participation in all desired activities (may need albuterol before activity)  Albuterol use two time or less a week on average (not counting use with activity)  Cough interfering with sleep two time or less a month  Oral steroids no more than once a year  No hospitalizations  Pruritis - Continue daily moisturizer - Continue Loratidine daily - Continue  Triamcinolone 0.1% cream on red itchy areas below your face twice a day as needed.  Reflux  -Continue Prilosec daily  Continue the other medications as listed in the chart.  Follow up in 6 months or sooner if needed  I appreciate the opportunity to take part in Rachel Crane's care. Please do not hesitate to contact me with questions.  Sincerely,   Prudy Feeler, MD Allergy/Immunology [facility]

## 2020-01-07 NOTE — Patient Instructions (Addendum)
Allergic Rhinitis: - Continue Flonase 2 sprays each nostril once daily for sinus congestion - Continue Azelastine 0.1% 2 sprays each nostril twice a day for runny nose/post-nasal drip/nasal itch. - Can use both Flonase and Azelastine together if needed - Continue Claritin 10mg  daily.   Asthma - ProAir 2 puffs every 4 hours as needed for cough or wheeze - Use Symbicort 80- 2 puffs twice a day everyday no matter if you feel well or sick  Control goals:   Full participation in all desired activities (may need albuterol before activity)  Albuterol use two time or less a week on average (not counting use with activity)  Cough interfering with sleep two time or less a month  Oral steroids no more than once a year  No hospitalizations  Pruritis - Continue daily moisturizer - Continue Loratidine daily - Continue Triamcinolone 0.1% cream on red itchy areas below your face twice a day as needed.  Reflux  -Continue Prilosec daily  Continue the other medications as listed in the chart.  Follow up in 6 months or sooner if needed

## 2020-01-25 ENCOUNTER — Telehealth: Payer: Self-pay | Admitting: Allergy

## 2020-01-25 MED ORDER — TRIAMCINOLONE ACETONIDE 0.1 % EX CREA: TOPICAL_CREAM | CUTANEOUS | 5 refills | Status: DC

## 2020-01-25 NOTE — Telephone Encounter (Signed)
Patient was seen 01/07/20, by Dr. Nelva Bush. She said she called her pharmacy for a refill for Triamcinoloned, and had no refills. She would like this sent in to [facility] on [address].

## 2020-01-25 NOTE — Telephone Encounter (Signed)
Refill sent and patient aware.

## 2020-01-29 ENCOUNTER — Telehealth: Payer: Self-pay | Admitting: Physical Medicine and Rehabilitation

## 2020-01-29 MED ORDER — TRIAMCINOLONE ACETONIDE 0.1 % EX CREA: TOPICAL_CREAM | CUTANEOUS | 1 refills | Status: DC

## 2020-01-29 NOTE — Telephone Encounter (Signed)
Patient called.   She is requesting a refill on her gabapentin but also wanted to know if she is supposed to even still be taking it.   Call back: [phone]

## 2020-01-29 NOTE — Addendum Note (Signed)
Addended by: Valere Dross on: 01/29/2020 11:00 AM   Modules accepted: Orders

## 2020-01-29 NOTE — Telephone Encounter (Signed)
Patient called back stating she did not want the tube and wanted a jar of this, spoke with Dr. Nelva Bush and she gave approval to dispense the Jar of Triamcinolone 0.1%.

## 2020-02-01 ENCOUNTER — Other Ambulatory Visit: Payer: Self-pay | Admitting: Physical Medicine and Rehabilitation

## 2020-02-01 MED ORDER — GABAPENTIN 300 MG PO CAPS: 300.0000 mg | ORAL_CAPSULE | Freq: Three times a day (TID) | ORAL | 3 refills | Status: DC

## 2020-02-01 NOTE — Telephone Encounter (Signed)
Please advise 

## 2020-02-01 NOTE — Telephone Encounter (Signed)
She can take 2 at night for a week or so then 2 in the morning along with the two at night and over time to her tolerance workup to 600mg  TID, that is the midlevel dosing so there is still room to go up after that.

## 2020-02-01 NOTE — Telephone Encounter (Signed)
Patient states that she takes 300 mg TID, but she wants to take more because the current amount does not help as much as she would like. Please advise.

## 2020-02-01 NOTE — Telephone Encounter (Signed)
Yes I would take, TID if possible, I refilled. Looks like she was supposed to follow with neurosurgeon?

## 2020-02-02 NOTE — Telephone Encounter (Signed)
Called patient and left a message to advise.

## 2020-02-03 DIAGNOSIS — I10 Essential (primary) hypertension: Secondary | ICD-10-CM | POA: Diagnosis not present

## 2020-02-03 DIAGNOSIS — E1169 Type 2 diabetes mellitus with other specified complication: Secondary | ICD-10-CM | POA: Diagnosis not present

## 2020-02-03 DIAGNOSIS — E876 Hypokalemia: Secondary | ICD-10-CM | POA: Diagnosis not present

## 2020-02-03 DIAGNOSIS — E7800 Pure hypercholesterolemia, unspecified: Secondary | ICD-10-CM | POA: Diagnosis not present

## 2020-02-05 DIAGNOSIS — H2513 Age-related nuclear cataract, bilateral: Secondary | ICD-10-CM | POA: Diagnosis not present

## 2020-02-05 DIAGNOSIS — H401131 Primary open-angle glaucoma, bilateral, mild stage: Secondary | ICD-10-CM | POA: Diagnosis not present

## 2020-02-12 ENCOUNTER — Ambulatory Visit (INDEPENDENT_AMBULATORY_CARE_PROVIDER_SITE_OTHER): Payer: Medicare Other | Admitting: Orthopaedic Surgery

## 2020-02-12 ENCOUNTER — Ambulatory Visit (INDEPENDENT_AMBULATORY_CARE_PROVIDER_SITE_OTHER): Payer: Medicare Other

## 2020-02-12 ENCOUNTER — Ambulatory Visit: Payer: Self-pay

## 2020-02-12 ENCOUNTER — Encounter: Payer: Self-pay | Admitting: Orthopaedic Surgery

## 2020-02-12 DIAGNOSIS — M25511 Pain in right shoulder: Secondary | ICD-10-CM

## 2020-02-12 DIAGNOSIS — M79641 Pain in right hand: Secondary | ICD-10-CM | POA: Diagnosis not present

## 2020-02-12 DIAGNOSIS — M25512 Pain in left shoulder: Secondary | ICD-10-CM

## 2020-02-12 DIAGNOSIS — R634 Abnormal weight loss: Secondary | ICD-10-CM

## 2020-02-12 DIAGNOSIS — M542 Cervicalgia: Secondary | ICD-10-CM

## 2020-02-12 DIAGNOSIS — M79642 Pain in left hand: Secondary | ICD-10-CM

## 2020-02-12 NOTE — Progress Notes (Signed)
Subjective: Patient is here for ultrasound-guided intra-articular bilateral glenohumeral injection.    Objective: Pain with active overhead reach bilaterally.  Procedure: Ultrasound-guided bilateral glenohumeral injection: After sterile prep with Betadine, injected 8 cc 1% lidocaine without epinephrine and 40 mg methylprednisolone using a 22-gauge spinal needle, passing the needle from posterior approach into the glenohumeral joint.  Injectate seen filling both joint capsules.  She will follow-up as directed.

## 2020-02-12 NOTE — Progress Notes (Addendum)
Office Visit Note   Patient: Rachel Crane           Date of Birth: 06-22-1946           MRN: [id number] Visit Date: 02/12/2020              Requested by: Rachel Prose, MD [address] [place],  [place] [address] PCP: Rachel Prose, MD   Assessment & Plan: Visit Diagnoses:  1. Neck pain   2. Acute pain of both shoulders   3. Pain in both hands   4. Weight loss     Plan: Impression is multiple joint pain, bilateral shoulder pain consistent with rotator cuff syndrome more so on the right.  Overall I think that she has degenerative changes in her shoulder with questionable rotator cuff pathology.  I would also like to evaluate her for carpal tunnel syndrome.  Arthritis panel obtained today.  Dr. Junius Roads performed ultrasound-guided shoulder injections.  Follow-up after the nerve conduction studies.  Follow-Up Instructions: Return if symptoms worsen or fail to improve.   Orders:  Orders Placed This Encounter  Procedures  . XR Cervical Spine 2 or 3 views  . XR Shoulder Right  . XR Shoulder Left  . US Guided Needle Placement - No Linked Charges  . Rheumatoid Factor  . Uric acid  . Antinuclear Antib (ANA)  . Sed Rate (ESR)  . Ambulatory referral to Physical Medicine Rehab  . Amb Ref to Medical Weight Management   No orders of the defined types were placed in this encounter.     Procedures: No procedures performed   Clinical Data: No additional findings.   Subjective: Chief Complaint  Patient presents with  . Right Shoulder - Pain  . Left Shoulder - Pain  . Neck - Pain    Rachel Crane is a 74 year old female comes in for evaluation of chronic bilateral shoulder pain is worse with any movement.  She is also complained of numbness in both hands.  She denies any injuries.  She also complains of chronic hand numbness and tingling it was well as left knee pain.   Review of Systems  Constitutional: Negative.   HENT: Negative.   Eyes: Negative.   Respiratory:  Negative.   Cardiovascular: Negative.   Endocrine: Negative.   Musculoskeletal: Negative.   Neurological: Negative.   Hematological: Negative.   Psychiatric/Behavioral: Negative.   All other systems reviewed and are negative.    Objective: Vital Signs: There were no vitals taken for this visit.  Physical Exam Vitals and nursing note reviewed.  Constitutional:      Appearance: She is well-developed.  Pulmonary:     Effort: Pulmonary effort is normal.  Skin:    General: Skin is warm.     Capillary Refill: Capillary refill takes less than 2 seconds.  Neurological:     Mental Status: She is alert and oriented to person, place, and time.  Psychiatric:        Behavior: Behavior normal.        Thought Content: Thought content normal.        Judgment: Judgment normal.     Ortho Exam Left knee shows no joint effusion.  Bilateral shoulders show painful range of motion.  Passive range of motion is normal.  Active range of motion slightly limited secondary to pain.  Manual muscle testing is slightly decreased on the right shoulder.  Equivocal carpal tunnel compressive signs. Specialty Comments:  No specialty comments available.  Imaging: No  results found.   PMFS History: Patient Active Problem List   Diagnosis Date Noted  . Chronic pain of left knee 06/26/2019  . Lumbar radiculopathy 06/26/2019  . Left-sided low back pain with left-sided sciatica 06/26/2019  . Thoracic spinal stenosis 04/18/2019  . Seasonal allergies 12/04/2018  . Allergic rhinoconjunctivitis 10/10/2018  . Moderate persistent asthma with acute exacerbation 10/10/2018  . Pruritus 10/10/2018  . Aortic valve sclerosis 10/06/2018  . Bilateral carotid bruits 10/06/2018  . Abnormal stress ECG 10/06/2018  . Laboratory examination 10/06/2018  . Palpitations 10/06/2018  . Total knee replacement status 09/19/2017  . Sprain of anterior talofibular ligament of right ankle 12/13/2016   Past Medical History:    Diagnosis Date  . Abnormal stress ECG 10/06/2018  . Aortic valve sclerosis 10/06/2018  . Arthritis   . Asthma   . Bilateral carotid bruits 10/06/2018  . Headache    H/O bad HA, since using CPAP- she has had improvement with that issue   . Hypertension   . Laboratory examination 10/06/2018  . OSA on CPAP    settting - 8, uses CPAP q night   . Shingles     Family History  Problem Relation Age of Onset  . Breast cancer Sister     Past Surgical History:  Procedure Laterality Date  . CHOLECYSTECTOMY  1998  . TOTAL KNEE ARTHROPLASTY Right 09/19/2017   Procedure: RIGHT TOTAL KNEE ARTHROPLASTY;  Surgeon: Leandrew Koyanagi, MD;  Location: [facility];  Service: Orthopedics;  Laterality: Right;  . TUBAL LIGATION     Social History   Occupational History  . Not on file  Tobacco Use  . Smoking status: Never Smoker  . Smokeless tobacco: Never Used  Vaping Use  . Vaping Use: Never used  Substance and Sexual Activity  . Alcohol use: Yes    Comment: sometimes wine  . Drug use: No  . Sexual activity: Not on file

## 2020-02-15 LAB — ANTI-NUCLEAR AB-TITER (ANA TITER)
ANA TITER: 1:40 {titer} — ABNORMAL HIGH
ANA Titer 1: 1:80 {titer} — ABNORMAL HIGH

## 2020-02-15 LAB — URIC ACID: Uric Acid, Serum: 7.8 mg/dL — ABNORMAL HIGH (ref 2.5–7.0)

## 2020-02-15 LAB — SEDIMENTATION RATE: Sed Rate: 25 mm/h (ref 0–30)

## 2020-02-15 LAB — ANA: Anti Nuclear Antibody (ANA): POSITIVE — AB

## 2020-02-15 LAB — RHEUMATOID FACTOR: Rheumatoid fact SerPl-aCnc: <14 IU/mL (ref <14)

## 2020-02-15 MED ORDER — PREDNISONE 10 MG (21) PO TBPK
ORAL_TABLET | ORAL | 0 refills | Status: DC
Start: 2020-02-15 — End: 2020-02-17

## 2020-02-15 NOTE — Addendum Note (Signed)
Addended by: Azucena Cecil on: 02/15/2020 08:44 PM   Modules accepted: Orders

## 2020-02-15 NOTE — Progress Notes (Signed)
Please let her know that the lab work shows that she's has gout and it's positive for other values.  We will need to send her to rheumatology please.  I'll send in some prednisone for her.

## 2020-02-16 ENCOUNTER — Telehealth: Payer: Self-pay | Admitting: Orthopaedic Surgery

## 2020-02-16 NOTE — Telephone Encounter (Signed)
Patient called.   She is requesting a call back for her lab results   Cal back:  [phone]

## 2020-02-16 NOTE — Telephone Encounter (Signed)
Pt called stating she still would like a CB; pt states she just wants to know about her blood work. Pt states she can see the lab work but can't open it.  [phone]

## 2020-02-17 ENCOUNTER — Other Ambulatory Visit: Payer: Self-pay

## 2020-02-17 DIAGNOSIS — R768 Other specified abnormal immunological findings in serum: Secondary | ICD-10-CM

## 2020-02-17 DIAGNOSIS — M1009 Idiopathic gout, multiple sites: Secondary | ICD-10-CM

## 2020-02-17 MED ORDER — PREDNISONE 10 MG (21) PO TBPK: ORAL_TABLET | ORAL | 0 refills | Status: DC

## 2020-02-17 NOTE — Telephone Encounter (Signed)
Discussed with patient. Someone will call her to schedule Rheumatology appt.

## 2020-03-01 ENCOUNTER — Encounter: Payer: Self-pay | Admitting: Orthopaedic Surgery

## 2020-03-01 ENCOUNTER — Ambulatory Visit (INDEPENDENT_AMBULATORY_CARE_PROVIDER_SITE_OTHER): Payer: Medicare Other | Admitting: Orthopaedic Surgery

## 2020-03-01 ENCOUNTER — Ambulatory Visit (INDEPENDENT_AMBULATORY_CARE_PROVIDER_SITE_OTHER): Payer: Medicare Other

## 2020-03-01 VITALS — BP 126/71 | HR 76 | Ht 65.5 in | Wt 211.0 lb

## 2020-03-01 DIAGNOSIS — M1712 Unilateral primary osteoarthritis, left knee: Secondary | ICD-10-CM

## 2020-03-01 MED ORDER — METHYLPREDNISOLONE ACETATE 40 MG/ML IJ SUSP
40.0000 mg | INTRAMUSCULAR | Status: AC | PRN
  Administered 2020-03-01: 40 mg via INTRA_ARTICULAR

## 2020-03-01 MED ORDER — LIDOCAINE HCL 1 % IJ SOLN
2.0000 mL | INTRAMUSCULAR | Status: AC | PRN
  Administered 2020-03-01: 2 mL

## 2020-03-01 MED ORDER — BUPIVACAINE HCL 0.25 % IJ SOLN
2.0000 mL | INTRAMUSCULAR | Status: AC | PRN
  Administered 2020-03-01: 2 mL via INTRA_ARTICULAR

## 2020-03-01 NOTE — Progress Notes (Signed)
Office Visit Note   Patient: Rachel Crane           Date of Birth: 1945-12-14           MRN: [id number] Visit Date: 03/01/2020              Requested by: Donald Prose, MD [address] [place],  [place] [address] PCP: Donald Prose, MD   Assessment & Plan: Visit Diagnoses:  1. Primary osteoarthritis of left knee     Plan: Impression is advanced degenerative joint disease left knee with underlying left lower extremity radiculopathy.  Today, we proceeded with diagnostic and hopefully therapeutic left knee cortisone injection.  She will follow up with [place] as needed.  Follow-Up Instructions: Return if symptoms worsen or fail to improve.   Orders:  Orders Placed This Encounter  Procedures  . Large Joint Inj: L knee  . XR KNEE 3 VIEW LEFT  . Ambulatory referral to Physical Therapy   No orders of the defined types were placed in this encounter.     Procedures: Large Joint Inj: L knee on 03/01/2020 1:31 PM Indications: pain Details: 22 G needle, anterolateral approach Medications: 2 mL lidocaine 1 %; 2 mL bupivacaine 0.25 %; 40 mg methylPREDNISolone acetate 40 MG/ML      Clinical Data: No additional findings.   Subjective: Chief Complaint  Patient presents with  . Left Knee - Pain    HPI patient is a very pleasant 74 year old female who comes in today with recurrent left knee pain.  History of advanced degenerative joint disease.  She has been dealing with this for the past several years and has recently worsened.  Rating her pain is to the medial aspect.  She also notes instability.  Walking seems to aggravate her symptoms.  She has been taking Tylenol without significant relief.  She has had a previous cortisone injection but cannot remember how well it helped.  She is also dealing with left lower extremity radiculopathy for which she has been seen by neurosurgery.  It was recommended that she undergo operative intervention but she is trying to avoid this for  now.  Review of Systems as detailed in HPI.  All others reviewed and are negative.   Objective: Vital Signs: BP 126/71   Pulse 76   Ht 5' 5.5" (1.664 m)   Wt 211 lb (95.7 kg)   BMI 34.58 kg/m   Physical Exam well-developed well-nourished female no acute distress.  Alert oriented x3.  Ortho Exam examination of her left knee shows a trace effusion.  Range of motion from 0 to 115 degrees.  Marked tenderness medial joint line.  Ligaments are stable.  She is neurovascular intact distally.  Specialty Comments:  No specialty comments available.  Imaging: XR KNEE 3 VIEW LEFT  Result Date: 03/01/2020 Advanced degenerative changes medial and patellofemoral compartments    PMFS History: Patient Active Problem List   Diagnosis Date Noted  . Chronic pain of left knee 06/26/2019  . Lumbar radiculopathy 06/26/2019  . Left-sided low back pain with left-sided sciatica 06/26/2019  . Thoracic spinal stenosis 04/18/2019  . Seasonal allergies 12/04/2018  . Allergic rhinoconjunctivitis 10/10/2018  . Moderate persistent asthma with acute exacerbation 10/10/2018  . Pruritus 10/10/2018  . Aortic valve sclerosis 10/06/2018  . Bilateral carotid bruits 10/06/2018  . Abnormal stress ECG 10/06/2018  . Laboratory examination 10/06/2018  . Palpitations 10/06/2018  . Total knee replacement status 09/19/2017  . Sprain of anterior talofibular ligament of right  ankle 12/13/2016   Past Medical History:  Diagnosis Date  . Abnormal stress ECG 10/06/2018  . Aortic valve sclerosis 10/06/2018  . Arthritis   . Asthma   . Bilateral carotid bruits 10/06/2018  . Headache    H/O bad HA, since using CPAP- she has had improvement with that issue   . Hypertension   . Laboratory examination 10/06/2018  . OSA on CPAP    settting - 8, uses CPAP q night   . Shingles     Family History  Problem Relation Age of Onset  . Breast cancer Sister     Past Surgical History:  Procedure Laterality Date  .  CHOLECYSTECTOMY  1998  . TOTAL KNEE ARTHROPLASTY Right 09/19/2017   Procedure: RIGHT TOTAL KNEE ARTHROPLASTY;  Surgeon: Leandrew Koyanagi, MD;  Location: [facility];  Service: Orthopedics;  Laterality: Right;  . TUBAL LIGATION     Social History   Occupational History  . Not on file  Tobacco Use  . Smoking status: Never Smoker  . Smokeless tobacco: Never Used  Vaping Use  . Vaping Use: Never used  Substance and Sexual Activity  . Alcohol use: Yes    Comment: sometimes wine  . Drug use: No  . Sexual activity: Not on file

## 2020-03-14 ENCOUNTER — Ambulatory Visit (INDEPENDENT_AMBULATORY_CARE_PROVIDER_SITE_OTHER): Payer: Medicare Other | Admitting: Physical Therapy

## 2020-03-14 ENCOUNTER — Other Ambulatory Visit: Payer: Self-pay

## 2020-03-14 ENCOUNTER — Telehealth: Payer: Self-pay

## 2020-03-14 ENCOUNTER — Encounter: Payer: Self-pay | Admitting: Physical Therapy

## 2020-03-14 DIAGNOSIS — R2689 Other abnormalities of gait and mobility: Secondary | ICD-10-CM | POA: Diagnosis not present

## 2020-03-14 DIAGNOSIS — R600 Localized edema: Secondary | ICD-10-CM | POA: Diagnosis not present

## 2020-03-14 DIAGNOSIS — M5442 Lumbago with sciatica, left side: Secondary | ICD-10-CM

## 2020-03-14 DIAGNOSIS — M6281 Muscle weakness (generalized): Secondary | ICD-10-CM | POA: Diagnosis not present

## 2020-03-14 DIAGNOSIS — M25662 Stiffness of left knee, not elsewhere classified: Secondary | ICD-10-CM

## 2020-03-14 DIAGNOSIS — G8929 Other chronic pain: Secondary | ICD-10-CM

## 2020-03-14 DIAGNOSIS — M25562 Pain in left knee: Secondary | ICD-10-CM | POA: Diagnosis not present

## 2020-03-14 NOTE — Patient Instructions (Signed)
Access Code: [email or web address] URL: [email or web address] Date: 03/14/2020 Prepared by: Elsie Ra  Exercises Hooklying Single Knee to Chest Stretch - 2 x daily - 6 x weekly - 1 sets - 2 reps - 30 hold Supine Bridge - 2 x daily - 6 x weekly - 10 reps - 1-2 sets - 5 hold Sitting Heel Slide with Towel - 2 x daily - 6 x weekly - 10 reps - 2 sets Supine Quadriceps Stretch with Strap on Table - 2 x daily - 6 x weekly - 2-3 reps - 30 hold Mini Squat with Counter Support - 2 x daily - 6 x weekly - 1-2 sets - 10 reps

## 2020-03-14 NOTE — Therapy (Addendum)
[facility] [address] [place], [place], [address] Phone: [phone]   Fax:  [phone]  Physical Therapy Evaluation/Discharge  Patient Details  Name: Rachel Crane MRN: [id number] Date of Birth: [birth year] Referring Provider (PT): Aundra Dubin, [place]   Encounter Date: 03/14/2020   PT End of Session - 03/14/20 1451    Visit Number 1    Number of Visits 12    Date for PT Re-Evaluation 04/25/20    Authorization Type MCR    PT Start Time 4166   arrrives late for 1345 apt   PT Stop Time 1432    PT Time Calculation (min) 27 min    Activity Tolerance Patient limited by pain    Behavior During Therapy [facility] for tasks assessed/performed           Past Medical History:  Diagnosis Date  . Abnormal stress ECG 10/06/2018  . Aortic valve sclerosis 10/06/2018  . Arthritis   . Asthma   . Bilateral carotid bruits 10/06/2018  . Headache    H/O bad HA, since using CPAP- she has had improvement with that issue   . Hypertension   . Laboratory examination 10/06/2018  . OSA on CPAP    settting - 8, uses CPAP q night   . Shingles     Past Surgical History:  Procedure Laterality Date  . CHOLECYSTECTOMY  1998  . TOTAL KNEE ARTHROPLASTY Right 09/19/2017   Procedure: RIGHT TOTAL KNEE ARTHROPLASTY;  Surgeon: Leandrew Koyanagi, MD;  Location: [facility];  Service: Orthopedics;  Laterality: Right;  . TUBAL LIGATION      There were no vitals filed for this visit.    Subjective Assessment - 03/14/20 1406    Subjective 74 year old female who comes in today with recurrent left knee pain.  History of advanced degenerative joint disease.  She has been dealing with this for the past several years and has recently worsened.   She also notes instability.  Walking seems to aggravate her symptoms.  She had recent injection,   She is also dealing with left lower extremity radiculopathy for which she has been seen by neurosurgery. MD recommending surgical intervention but she is  hoping ot avoid this for now.    Pertinent History PMH: lumbar radicupathy, advanced Lt knee OA, Rt TKA 2019,HTN,OSA on CPAP    Limitations Walking;House hold activities;Sitting;Lifting;Standing    How long can you stand comfortably? 30 min    How long can you walk comfortably? pain in left leg as soon as she starts walking.    Diagnostic tests Lt  Knee XR showing advanced OA    Patient Stated Goals reduce    Currently in Pain? Yes    Pain Score 5     Pain Location Knee    Pain Orientation Left    Pain Descriptors / Indicators Aching;Tingling;Numbness    Pain Type Chronic pain    Pain Radiating Towards lumbar radiculopathy into her Lt leg    Pain Onset More than a month ago    Pain Frequency Intermittent    Aggravating Factors  standing, walking    Pain Relieving Factors rest, ice              [facility] PT Assessment - 03/14/20 0001      Assessment   Medical Diagnosis Lt knee OA, lumbar radiculopathy    Referring Provider (PT) Aundra Dubin, PA-C    Onset Date/Surgical Date --   chronic pain >2 years   Next MD  Visit 04/08/20   sees Ernestina Patches MD, does not have F/U with referring provider     Precautions   Precautions None      Restrictions   Weight Bearing Restrictions No      Balance Screen   Has the patient fallen in the past 6 months No    Has the patient had a decrease in activity level because of a fear of falling?  Yes   due to pain   Is the patient reluctant to leave their home because of a fear of falling?  No      Home Ecologist residence      Prior Function   Level of Independence Independent      Cognition   Overall Cognitive Status Within Functional Limits for tasks assessed      Observation/Other Assessments-Edema    Edema --   moderate edema in Lt knee     Sensation   Light Touch Appears Intact      ROM / Strength   AROM / PROM / Strength AROM;Strength      AROM   AROM Assessment Site Lumbar;Knee    Right/Left Knee  Left    Left Knee Extension -8    Left Knee Flexion 110    Lumbar Flexion WFL   no increased radiculopathy   Lumbar Extension 50%   no radiculopathy   Lumbar - Right Rotation 50%    Lumbar - Left Rotation 50%      Strength   Overall Strength Comments Lt leg overall 4/5 MMT, Rt leg overall 4+/5 MMT      Flexibility   Soft Tissue Assessment /Muscle Length --   tight hamstrings and quads on Lt     Palpation   Palpation comment very TTP in medial knee and patella      Special Tests   Other special tests negative slump test, negative SLR test for lumbar radiculopathy      Transfers   Transfers Independent with all Transfers      Ambulation/Gait   Ambulation/Gait Yes    Ambulation/Gait Assistance 6: Modified independent (Device/Increase time)    Ambulation Distance (Feet) 100 Feet    Assistive device Straight cane    Gait Pattern Decreased step length - right;Decreased step length - left;Decreased stance time - left;Decreased stride length;Decreased hip/knee flexion - right;Decreased hip/knee flexion - left                      Objective measurements completed on examination: See above findings.       [facility] Adult PT Treatment/Exercise - 03/14/20 0001      Modalities   Modalities Vasopneumatic      Vasopneumatic   Number Minutes Vasopneumatic  10 minutes    Vasopnuematic Location  Knee    Vasopneumatic Pressure Medium    Vasopneumatic Temperature  34                  PT Education - 03/14/20 1451    Education Details HEP, POC    Person(s) Educated Patient    Methods Explanation;Demonstration;Verbal cues;Handout    Comprehension Verbalized understanding;Need further instruction               PT Long Term Goals - 03/14/20 1500      PT LONG TERM GOAL #1   Title Pt will be I and compliant with HEP.    Time 6    Period Weeks  Status New    Target Date 04/25/20      PT LONG TERM GOAL #2   Title Pt will improve Lt knee ROM 5-115 and  Lumbar ROM to [facility].    Baseline knee 8-110, 50% lumbar ROM    Status New      PT LONG TERM GOAL #3   Title Pt will improve Lt leg strength to overall 4+/5    Baseline 4    Status New      PT LONG TERM GOAL #4   Title Pt will reduce overall pain 50%    Time 6    Period Weeks    Status New                  Plan - 03/14/20 1453    Clinical Impression Statement Pt presents with Lt knee OA pain complicated by Lt lumbar radiculopathy. Today lumbar special tests mostly negative at provoking knee pain and knee pain aggravated by all weight bearing activity. She will benefit from skilled PT to address her deficits in leg strength, knee ROM, balance, and activity tolerance.    Personal Factors and Comorbidities Comorbidity 3+    Comorbidities PMH: advanced Lt knee OA, Rt TKA 2019,HTN,OSA on CPAP    Examination-Activity Limitations Bend;Carry;Dressing;Lift;Stand;Stairs;Squat;Locomotion Level;Transfers    Examination-Participation Restrictions Cleaning;Community Activity;Laundry;Shop    Stability/Clinical Decision Making Evolving/Moderate complexity    Clinical Decision Making Moderate    Rehab Potential Good    PT Frequency 2x / week   1-2   PT Duration 6 weeks    PT Treatment/Interventions ADLs/Self Care Home Management;Cryotherapy;Electrical Stimulation;Iontophoresis 41m/ml Dexamethasone;Moist Heat;Traction;Parrafin;Gait training;Stair training;Therapeutic activities;Therapeutic exercise;Balance training;Neuromuscular re-education;Manual techniques;Dry needling;Passive range of motion;Joint Manipulations;Spinal Manipulations;Taping;Vasopneumatic Device    PT Next Visit Plan review and update HEP PRN, try bike    PT Home Exercise Plan [facility]          Patient will benefit from skilled therapeutic intervention in order to improve the following deficits and impairments:  Abnormal gait, Decreased activity tolerance, Decreased balance, Decreased endurance, Decreased range  of motion, Decreased mobility, Decreased strength, Hypomobility, Increased edema, Difficulty walking, Impaired flexibility, Increased muscle spasms, Increased fascial restricitons, Pain, Improper body mechanics, Postural dysfunction  Visit Diagnosis: Chronic pain of left knee  Stiffness of left knee, not elsewhere classified  Chronic bilateral low back pain with left-sided sciatica  Muscle weakness (generalized)  Other abnormalities of gait and mobility  Localized edema     Problem List Patient Active Problem List   Diagnosis Date Noted  . Chronic pain of left knee 06/26/2019  . Lumbar radiculopathy 06/26/2019  . Left-sided low back pain with left-sided sciatica 06/26/2019  . Thoracic spinal stenosis 04/18/2019  . Seasonal allergies 12/04/2018  . Allergic rhinoconjunctivitis 10/10/2018  . Moderate persistent asthma with acute exacerbation 10/10/2018  . Pruritus 10/10/2018  . Aortic valve sclerosis 10/06/2018  . Bilateral carotid bruits 10/06/2018  . Abnormal stress ECG 10/06/2018  . Laboratory examination 10/06/2018  . Palpitations 10/06/2018  . Total knee replacement status 09/19/2017  . Sprain of anterior talofibular ligament of right ankle 12/13/2016    BSilvestre Mesi8/30/2021, 3:04 PM   PHYSICAL THERAPY DISCHARGE SUMMARY  Visits from Start of Care: 1  Current functional level related to goals / functional outcomes: See note  Remaining deficits: See note   Education / Equipment: HEP Plan: Patient agrees to discharge.  Patient goals were not met. Patient is being discharged due to not returning since the last visit.  ?????  Scot Jun, PT, DPT, OCS, ATC 06/16/20  3:52 PM     [facility] [address] [place], [place], [address] Phone: [phone]   Fax:  [phone]  Name: Rachel Crane MRN: [id number] Date of Birth: January 27, 1946

## 2020-03-14 NOTE — Telephone Encounter (Signed)
See message. Which braces?

## 2020-03-14 NOTE — Telephone Encounter (Signed)
Patient came in for PT  Patient is requesting a knee brace from Dr.Xu  Call [phone].

## 2020-03-14 NOTE — Telephone Encounter (Signed)
Hinged

## 2020-03-18 NOTE — Telephone Encounter (Signed)
Called patient no answer LMOM. She needs a hinged knee brace. Needs to get fitted for one. Can do just a nurse visit.

## 2020-04-02 DIAGNOSIS — M48061 Spinal stenosis, lumbar region without neurogenic claudication: Secondary | ICD-10-CM | POA: Diagnosis not present

## 2020-04-04 ENCOUNTER — Telehealth: Payer: Self-pay | Admitting: Physical Therapy

## 2020-04-04 ENCOUNTER — Encounter: Payer: Medicare Other | Admitting: Physical Therapy

## 2020-04-04 NOTE — Telephone Encounter (Signed)
Pt no show for PT appointment today. They were contacted and informed of this via voicemail. They were provided the date and time of their next appointment on voicemail. They were instructed to call us to let us know if they cannot make their appointment.  Elsie Ra, PT, DPT 04/04/20 3:40 PM

## 2020-04-06 ENCOUNTER — Encounter: Payer: Medicare Other | Admitting: Physical Therapy

## 2020-04-08 ENCOUNTER — Encounter: Payer: Self-pay | Admitting: Physical Medicine and Rehabilitation

## 2020-04-08 ENCOUNTER — Other Ambulatory Visit: Payer: Self-pay

## 2020-04-08 ENCOUNTER — Ambulatory Visit (INDEPENDENT_AMBULATORY_CARE_PROVIDER_SITE_OTHER): Payer: Medicare Other | Admitting: Physical Medicine and Rehabilitation

## 2020-04-08 DIAGNOSIS — R202 Paresthesia of skin: Secondary | ICD-10-CM | POA: Diagnosis not present

## 2020-04-08 NOTE — Progress Notes (Signed)
Patient reports that she is better. No pain and no numbness since having an injection. Was having bilateral shoulder and hand pain with tingling in right hand. Right hand dominant  Numeric Pain Rating Scale and Functional Assessment Average Pain 0   In the last MONTH (on 0-10 scale) has pain interfered with the following?  1. General activity like being  able to carry out your everyday physical activities such as walking, climbing stairs, carrying groceries, or moving a chair?  Rating(0)

## 2020-04-11 ENCOUNTER — Encounter: Payer: Medicare Other | Admitting: Physical Therapy

## 2020-04-11 NOTE — Procedures (Signed)
EMG & NCV Findings: Evaluation of the right ulnar motor nerve showed reduced amplitude (2.2 mV).  The right median (across palm) sensory nerve showed prolonged distal peak latency (Wrist, 4.2 ms) and prolonged distal peak latency (Palm, 3.9 ms).  All remaining nerves (as indicated in the following tables) were within normal limits.    All examined muscles (as indicated in the following table) showed no evidence of electrical instability.    Impression: The above electrodiagnostic study is ABNORMAL and reveals evidence of a mild right median nerve entrapment at the wrist (carpal tunnel syndrome) affecting sensory components.   There is no significant electrodiagnostic evidence of any other focal nerve entrapment, brachial plexopathy, cervical radiculopathy or generalized peripheral neuropathy.    **This electrodiagnostic study cannot rule out small fiber polyneuropathy and dysesthesias from central pain syndromes such as stroke or central pain sensitization syndromes such as fibromyalgia.  Myotomal referral pain from trigger points is also not excluded.  Recommendations: 1.  Follow-up with referring physician. 2.  Continue current management of symptoms. 3.  Suggest use of resting splint at night-time and as needed during the day.  ___________________________ Rachel Crane Board Certified, American Board of Physical Medicine and Rehabilitation    Nerve Conduction Studies Anti Sensory Summary Table   Stim Site NR Peak (ms) Norm Peak (ms) P-T Amp (V) Norm P-T Amp Site1 Site2 Delta-P (ms) Dist (cm) Vel (m/s) Norm Vel (m/s)  Right Median Acr Palm Anti Sensory (2nd Digit)  31.5C  Wrist    *4.2 <3.6 17.5 >10 Wrist Palm 0.3 0.0    Palm    *3.9 <2.0 2.3         Right Radial Anti Sensory (Base 1st Digit)  31.6C  Wrist    2.2 <3.1 22.9  Wrist Base 1st Digit 2.2 0.0    Right Ulnar Anti Sensory (5th Digit)  31.9C  Wrist    3.4 <3.7 18.2 >15.0 Wrist 5th Digit 3.4 14.0 41 >38   Motor  Summary Table   Stim Site NR Onset (ms) Norm Onset (ms) O-P Amp (mV) Norm O-P Amp Site1 Site2 Delta-0 (ms) Dist (cm) Vel (m/s) Norm Vel (m/s)  Right Median Motor (Abd Poll Brev)  31.7C  Wrist    4.2 <4.2 8.3 >5 Elbow Wrist 4.2 22.0 52 >50  Elbow    8.4  7.6         Right Ulnar Motor (Abd Dig Min)  31.9C  Wrist    2.8 <4.2 *2.2 >3 B Elbow Wrist 3.5 22.0 63 >53  B Elbow    6.3  7.7  A Elbow B Elbow 1.5 11.0 73 >53  A Elbow    7.8  7.7          EMG   Side Muscle Nerve Root Ins Act Fibs Psw Amp Dur Poly Recrt Int Fraser Din Comment  Right Abd Poll Brev Median C8-T1 Nml Nml Nml Nml Nml 0 Nml Nml   Right 1stDorInt Ulnar C8-T1 Nml Nml Nml Nml Nml 0 Nml Nml   Right PronatorTeres Median C6-7 Nml Nml Nml Nml Nml 0 Nml Nml   Right Biceps Musculocut C5-6 Nml Nml Nml Nml Nml 0 Nml Nml   Right Deltoid Axillary C5-6 Nml Nml Nml Nml Nml 0 Nml Nml     Nerve Conduction Studies Anti Sensory Left/Right Comparison   Stim Site L Lat (ms) R Lat (ms) L-R Lat (ms) L Amp (V) R Amp (V) L-R Amp (%) Site1 Site2 L Vel (m/s) R Vel (m/s)  L-R Vel (m/s)  Median Acr Palm Anti Sensory (2nd Digit)  31.5C  Wrist  *4.2   17.5  Wrist Palm     Palm  *3.9   2.3        Radial Anti Sensory (Base 1st Digit)  31.6C  Wrist  2.2   22.9  Wrist Base 1st Digit     Ulnar Anti Sensory (5th Digit)  31.9C  Wrist  3.4   18.2  Wrist 5th Digit  41    Motor Left/Right Comparison   Stim Site L Lat (ms) R Lat (ms) L-R Lat (ms) L Amp (mV) R Amp (mV) L-R Amp (%) Site1 Site2 L Vel (m/s) R Vel (m/s) L-R Vel (m/s)  Median Motor (Abd Poll Brev)  31.7C  Wrist  4.2   8.3  Elbow Wrist  52   Elbow  8.4   7.6        Ulnar Motor (Abd Dig Min)  31.9C  Wrist  2.8   *2.2  B Elbow Wrist  63   B Elbow  6.3   7.7  A Elbow B Elbow  73   A Elbow  7.8   7.7           Waveforms:

## 2020-04-12 ENCOUNTER — Encounter: Payer: Self-pay | Admitting: Physician Assistant

## 2020-04-12 ENCOUNTER — Ambulatory Visit (INDEPENDENT_AMBULATORY_CARE_PROVIDER_SITE_OTHER): Payer: Medicare Other | Admitting: Orthopaedic Surgery

## 2020-04-12 DIAGNOSIS — M1712 Unilateral primary osteoarthritis, left knee: Secondary | ICD-10-CM | POA: Diagnosis not present

## 2020-04-12 MED ORDER — METHOCARBAMOL 500 MG PO TABS: 500.0000 mg | ORAL_TABLET | Freq: Two times a day (BID) | ORAL | 0 refills | Status: DC | PRN

## 2020-04-12 MED ORDER — HYDROCODONE-ACETAMINOPHEN 5-325 MG PO TABS: 1.0000 | ORAL_TABLET | Freq: Every day | ORAL | 0 refills | Status: DC | PRN

## 2020-04-12 NOTE — Progress Notes (Signed)
Rachel Crane - 74 y.o. female MRN [id number]  Date of birth: Nov 22, 1945  Office Visit Note: Visit Date: 04/08/2020 PCP: Donald Prose, MD Referred by: Donald Prose, MD  Subjective: Chief Complaint  Patient presents with  . Right Shoulder - Pain  . Left Shoulder - Pain   HPI:  Rachel Crane is a 74 y.o. female who comes in today at the request of Dr. Eduard Roux for electrodiagnostic study of the Right upper extremities.  Patient is Right hand dominant.  Interestingly at the time of the request for electrodiagnostic studies she was having bilateral shoulder pain with tingling in the right more than left hand.  Since that time she has undergone injection of the left knee I believe with cortisone by Dr. Erlinda Hong and she reports since that time she is really not had any pain or numbness in the hands or shoulders.  She has been referred to rheumatology after rheumatologic lab test by Dr. Wyline Copas.  She has no previous electrodiagnostic studies.  The numbness and tingling that she had in the hand was somewhat global on the right may be somewhat more median nerve.  She denies any frank radicular symptoms down the arms.  She does have a history of lumbar spine issues with radiculopathy and we have seen her in the past for injection.   ROS Otherwise per HPI.  Assessment & Plan: Visit Diagnoses:  1. Paresthesia of skin     Plan: Impression: The above electrodiagnostic study is ABNORMAL and reveals evidence of a mild right median nerve entrapment at the wrist (carpal tunnel syndrome) affecting sensory components.   There is no significant electrodiagnostic evidence of any other focal nerve entrapment, brachial plexopathy, cervical radiculopathy or generalized peripheral neuropathy.    **This electrodiagnostic study cannot rule out small fiber polyneuropathy and dysesthesias from central pain syndromes such as stroke or central pain sensitization syndromes such as fibromyalgia.  Myotomal referral pain from trigger  points is also not excluded.  Recommendations: 1.  Follow-up with referring physician. 2.  Continue current management of symptoms. 3.  Suggest use of resting splint at night-time and as needed during the day.  Meds & Orders: No orders of the defined types were placed in this encounter.   Orders Placed This Encounter  Procedures  . NCV with EMG (electromyography)    Follow-up: Return for  Eduard Roux, M.D..   Procedures: No procedures performed  EMG & NCV Findings: Evaluation of the right ulnar motor nerve showed reduced amplitude (2.2 mV).  The right median (across palm) sensory nerve showed prolonged distal peak latency (Wrist, 4.2 ms) and prolonged distal peak latency (Palm, 3.9 ms).  All remaining nerves (as indicated in the following tables) were within normal limits.    All examined muscles (as indicated in the following table) showed no evidence of electrical instability.    Impression: The above electrodiagnostic study is ABNORMAL and reveals evidence of a mild right median nerve entrapment at the wrist (carpal tunnel syndrome) affecting sensory components.   There is no significant electrodiagnostic evidence of any other focal nerve entrapment, brachial plexopathy, cervical radiculopathy or generalized peripheral neuropathy.    **This electrodiagnostic study cannot rule out small fiber polyneuropathy and dysesthesias from central pain syndromes such as stroke or central pain sensitization syndromes such as fibromyalgia.  Myotomal referral pain from trigger points is also not excluded.  Recommendations: 1.  Follow-up with referring physician. 2.  Continue current management of symptoms. 3.  Suggest use of resting splint at  night-time and as needed during the day.  ___________________________ Wonda Olds Board Certified, American Board of Physical Medicine and Rehabilitation    Nerve Conduction Studies Anti Sensory Summary Table   Stim Site NR Peak (ms) Norm  Peak (ms) P-T Amp (V) Norm P-T Amp Site1 Site2 Delta-P (ms) Dist (cm) Vel (m/s) Norm Vel (m/s)  Right Median Acr Palm Anti Sensory (2nd Digit)  31.5C  Wrist    *4.2 <3.6 17.5 >10 Wrist Palm 0.3 0.0    Palm    *3.9 <2.0 2.3         Right Radial Anti Sensory (Base 1st Digit)  31.6C  Wrist    2.2 <3.1 22.9  Wrist Base 1st Digit 2.2 0.0    Right Ulnar Anti Sensory (5th Digit)  31.9C  Wrist    3.4 <3.7 18.2 >15.0 Wrist 5th Digit 3.4 14.0 41 >38   Motor Summary Table   Stim Site NR Onset (ms) Norm Onset (ms) O-P Amp (mV) Norm O-P Amp Site1 Site2 Delta-0 (ms) Dist (cm) Vel (m/s) Norm Vel (m/s)  Right Median Motor (Abd Poll Brev)  31.7C  Wrist    4.2 <4.2 8.3 >5 Elbow Wrist 4.2 22.0 52 >50  Elbow    8.4  7.6         Right Ulnar Motor (Abd Dig Min)  31.9C  Wrist    2.8 <4.2 *2.2 >3 B Elbow Wrist 3.5 22.0 63 >53  B Elbow    6.3  7.7  A Elbow B Elbow 1.5 11.0 73 >53  A Elbow    7.8  7.7          EMG   Side Muscle Nerve Root Ins Act Fibs Psw Amp Dur Poly Recrt Int Fraser Din Comment  Right Abd Poll Brev Median C8-T1 Nml Nml Nml Nml Nml 0 Nml Nml   Right 1stDorInt Ulnar C8-T1 Nml Nml Nml Nml Nml 0 Nml Nml   Right PronatorTeres Median C6-7 Nml Nml Nml Nml Nml 0 Nml Nml   Right Biceps Musculocut C5-6 Nml Nml Nml Nml Nml 0 Nml Nml   Right Deltoid Axillary C5-6 Nml Nml Nml Nml Nml 0 Nml Nml     Nerve Conduction Studies Anti Sensory Left/Right Comparison   Stim Site L Lat (ms) R Lat (ms) L-R Lat (ms) L Amp (V) R Amp (V) L-R Amp (%) Site1 Site2 L Vel (m/s) R Vel (m/s) L-R Vel (m/s)  Median Acr Palm Anti Sensory (2nd Digit)  31.5C  Wrist  *4.2   17.5  Wrist Palm     Palm  *3.9   2.3        Radial Anti Sensory (Base 1st Digit)  31.6C  Wrist  2.2   22.9  Wrist Base 1st Digit     Ulnar Anti Sensory (5th Digit)  31.9C  Wrist  3.4   18.2  Wrist 5th Digit  41    Motor Left/Right Comparison   Stim Site L Lat (ms) R Lat (ms) L-R Lat (ms) L Amp (mV) R Amp (mV) L-R Amp (%) Site1 Site2 L Vel (m/s) R  Vel (m/s) L-R Vel (m/s)  Median Motor (Abd Poll Brev)  31.7C  Wrist  4.2   8.3  Elbow Wrist  52   Elbow  8.4   7.6        Ulnar Motor (Abd Dig Min)  31.9C  Wrist  2.8   *2.2  B Elbow Wrist  63   B Elbow  6.3  7.7  A Elbow B Elbow  73   A Elbow  7.8   7.7           Waveforms:             Clinical History: MRI LUMBAR SPINE WITHOUT CONTRAST  FINDINGS:   Alignment: Trace anterolisthesis L3 on L4 and 0.5 cm anterolisthesis L4 on L5 is due to facet arthropathy.  Vertebrae: No fracture, evidence of discitis, or bone lesion.  Conus medullaris and cauda equina: Conus extends to the L1 level. Conus and cauda equina appear normal. Although it is at the superior margin of the scan, there appears to be edema within the distal cord at T10-11.  Paraspinal and other soft tissues: Small T2 hyperintense lesions in the right kidney are most consistent with cysts.  Disc levels:  T10-11 and T11-12 are imaged in the sagittal plane only. Disc bulge, facet degenerative disease and ligamentum flavum thickening are seen at T10-11. There is central canal stenosis and bilateral foraminal narrowing.  T12-L1: Mild facet degenerative disease. Otherwise negative.  L1-2: There is ligamentum flavum thickening and a shallow disc bulge. Mild central canal narrowing is present. Foramina open.  L2-3: Bulky ligamentum flavum thickening, moderate facet arthropathy and a shallow disc bulge. There is moderately severe to severe central canal stenosis and bilateral subarticular recess narrowing.  L4-5: Advanced bilateral facet degenerative change and ligamentum flavum thickening. The disc is uncovered with a shallow bulge. Moderately severe to severe central canal stenosis and lateral recess narrowing. There is also mild to moderate bilateral foraminal narrowing.  L4-5: The disc is uncovered without bulging. Advanced facet degenerative changes present. There is mild central canal  and bilateral subarticular recess narrowing. Foramina are open.  L5-S1: Facet degenerative change and a shallow right paracentral protrusion. The central canal is open. Moderate to moderately severe foraminal narrowing is worse on the right.  IMPRESSION: Possible signal abnormality in the distal cord at T10-11 where there appears to be marked central canal stenosis. This level is imaged in the sagittal plane only and is at the superior margin of the scan. Recommend thoracic spine MRI without contrast for further evaluation.  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  Moderately severe to severe central canal and bilateral lateral recess narrowing L2-3 and L3-4.  Mild central canal and bilateral subarticular recess narrowing at L4-5.  Moderate to moderately severe foraminal narrowing at L5-S1 is worse on the right. The central canal is open.  Critical Value/emergent results were called by telephone at the time of interpretation on 04/17/2019 at 4:22 pm to providerDr. Francia Greaves, Who verbally acknowledged these results.   Electronically Signed By: Inge Rise M.D. On: 04/17/2019 16:25 ------ MRI THORACIC SPINE WITHOUT CONTRAST   Alignment: Exaggerated thoracic kyphosis. No significant spondylolisthesis.   Disc levels:  At T10-T11, there is a small disc bulge with superimposed small right center disc protrusion. Facet arthrosis. Prominent ligamentum flavum hypertrophy (particularly on the left). Resultant severe spinal canal stenosis with spinal cord flattening. Question subtle T2 hyperintensity within the spinal cord at this level which may reflect myelopathic change.  These results were called by telephone at the time of interpretation on 04/17/2019 at 8:09 pm to provider [facility] , who verbally acknowledged these results.  IMPRESSION: Significantly motion degraded examination.  At T10-T11, small disc bulge with superimposed  small right center disc protrusion. Facet arthrosis. Prominent ligamentum flavum hypertrophy (particularly on the left). Resultant severe spinal canal stenosis with spinal cord flattening. Question subtle T2  hyperintensity within the spinal cord at this level, which may reflect myelopathic.  No significant spinal canal stenosis at the remaining levels. No more than mild neural foraminal narrowing within the thoracic spine.   Electronically Signed   By: Kellie Simmering   On: 04/17/2019 20:11     Objective:  VS:  HT:    WT:   BMI:     BP:   HR: bpm  TEMP: ( )  RESP:  Physical Exam Musculoskeletal:        General: No swelling, tenderness or deformity.     Comments: Inspection reveals no atrophy of the bilateral APB or FDI or hand intrinsics. There is no swelling, color changes, allodynia or dystrophic changes. There is 5 out of 5 strength in the bilateral wrist extension, finger abduction and long finger flexion. There is intact sensation to light touch in all dermatomal and peripheral nerve distributions. There is a negative Phalen's test bilaterally. There is a negative Hoffmann's test bilaterally.  Skin:    General: Skin is warm and dry.     Findings: No erythema or rash.  Neurological:     General: No focal deficit present.     Mental Status: She is alert and oriented to person, place, and time.     Motor: No weakness or abnormal muscle tone.     Coordination: Coordination normal.  Psychiatric:        Mood and Affect: Mood normal.        Behavior: Behavior normal.      Imaging: No results found.

## 2020-04-12 NOTE — Progress Notes (Signed)
Office Visit Note   Patient: Rachel Crane           Date of Birth: [birth year]           MRN: [id number] Visit Date: 04/12/2020              Requested by: Donald Prose, MD [address] [place],  [place] [address] PCP: Donald Prose, MD   Assessment & Plan: Visit Diagnoses:  1. Unilateral primary osteoarthritis, left knee     Plan: Impression is advanced generative joint disease left knee.  We discussed treatment options to include left total knee arthroplasty.  She would like to proceed with this and would like it done as soon as possible.  She will follow up with Dr. Erlinda Hong this coming Thursday morning for further discussion.  Follow-Up Instructions: Return for on thursday to discuss left TKR with Dr. Erlinda Hong.   Orders:  No orders of the defined types were placed in this encounter.  Meds ordered this encounter  Medications  . HYDROcodone-acetaminophen (NORCO) 5-325 MG tablet    Sig: Take 1-2 tablets by mouth daily as needed.    Dispense:  30 tablet    Refill:  0  . methocarbamol (ROBAXIN) 500 MG tablet    Sig: Take 1 tablet (500 mg total) by mouth 2 (two) times daily as needed.    Dispense:  20 tablet    Refill:  0      Procedures: No procedures performed   Clinical Data: No additional findings.   Subjective: Chief Complaint  Patient presents with  . Left Knee - Pain    HPI patient is a very pleasant 74 year old female who comes in today with continued left knee pain. History of advanced generative joint disease to the left knee. She has recently seen [place] where cortisone injection was performed. This provided only temporary relief of symptoms.  She comes in today with increasing pain to the entire aspect of the left knee.  Worse with activity.  She is unable to work due to her symptoms.  She has been taking extract Tylenol without any relief.  Review of Systems as detailed in HPI.  All others reviewed and are negative.   Objective: Vital Signs: There were no  vitals taken for this visit.  Physical Exam well-developed well-nourished female no acute distress.  Alert and oriented x3.  Ortho Exam examination of her left knee shows a trace effusion.  She has a slight varus deformity.  Medial and lateral joint line tenderness.  She is neurovascular intact distally.  Specialty Comments:  No specialty comments available.  Imaging: No new imaging   PMFS History: Patient Active Problem List   Diagnosis Date Noted  . Chronic pain of left knee 06/26/2019  . Lumbar radiculopathy 06/26/2019  . Left-sided low back pain with left-sided sciatica 06/26/2019  . Thoracic spinal stenosis 04/18/2019  . Seasonal allergies 12/04/2018  . Allergic rhinoconjunctivitis 10/10/2018  . Moderate persistent asthma with acute exacerbation 10/10/2018  . Pruritus 10/10/2018  . Aortic valve sclerosis 10/06/2018  . Bilateral carotid bruits 10/06/2018  . Abnormal stress ECG 10/06/2018  . Laboratory examination 10/06/2018  . Palpitations 10/06/2018  . Total knee replacement status 09/19/2017  . Sprain of anterior talofibular ligament of right ankle 12/13/2016   Past Medical History:  Diagnosis Date  . Abnormal stress ECG 10/06/2018  . Aortic valve sclerosis 10/06/2018  . Arthritis   . Asthma   . Bilateral carotid bruits 10/06/2018  . Headache  H/O bad HA, since using CPAP- she has had improvement with that issue   . Hypertension   . Laboratory examination 10/06/2018  . OSA on CPAP    settting - 8, uses CPAP q night   . Shingles     Family History  Problem Relation Age of Onset  . Breast cancer Sister     Past Surgical History:  Procedure Laterality Date  . CHOLECYSTECTOMY  1998  . TOTAL KNEE ARTHROPLASTY Right 09/19/2017   Procedure: RIGHT TOTAL KNEE ARTHROPLASTY;  Surgeon: Leandrew Koyanagi, MD;  Location: [facility];  Service: Orthopedics;  Laterality: Right;  . TUBAL LIGATION     Social History   Occupational History  . Not on file  Tobacco Use  . Smoking  status: Never Smoker  . Smokeless tobacco: Never Used  Vaping Use  . Vaping Use: Never used  Substance and Sexual Activity  . Alcohol use: Yes    Comment: sometimes wine  . Drug use: No  . Sexual activity: Not on file

## 2020-04-14 ENCOUNTER — Ambulatory Visit: Payer: Medicare Other | Admitting: Orthopaedic Surgery

## 2020-04-14 ENCOUNTER — Encounter: Payer: Medicare Other | Admitting: Physical Therapy

## 2020-04-14 ENCOUNTER — Ambulatory Visit (INDEPENDENT_AMBULATORY_CARE_PROVIDER_SITE_OTHER): Payer: Medicare Other | Admitting: Orthopaedic Surgery

## 2020-04-14 ENCOUNTER — Encounter: Payer: Self-pay | Admitting: Orthopaedic Surgery

## 2020-04-14 VITALS — Ht 66.0 in | Wt 213.0 lb

## 2020-04-14 DIAGNOSIS — M1712 Unilateral primary osteoarthritis, left knee: Secondary | ICD-10-CM

## 2020-04-14 NOTE — Progress Notes (Signed)
Office Visit Note   Patient: Rachel Crane           Date of Birth: [birth year]           MRN: [id number] Visit Date: 04/14/2020              Requested by: Donald Prose, MD [address],  [place] [address] PCP: Donald Prose, MD   Assessment & Plan: Visit Diagnoses:  1. Primary osteoarthritis of left knee     Plan: Impression is end-stage left knee DJD.  Based on discussion of treatment options she has elected to proceed with a left total knee replacement in the near future.  I will call her daughter later today to explain everything as well.  The patient understands that under current Covid restrictions in the hospital she would be encouraged to only stay overnight and then discharge home.  Sounds like her daughter can help her out with certain things she lives in [place]. Total face to face encounter time was greater than 25 minutes and over half of this time was spent in counseling and/or coordination of care.  Follow-Up Instructions: Return if symptoms worsen or fail to improve.   Orders:  Orders Placed This Encounter  Procedures  . Prealbumin   No orders of the defined types were placed in this encounter.     Procedures: No procedures performed   Clinical Data: No additional findings.   Subjective: Chief Complaint  Patient presents with  . Left Knee - Follow-up    Discuss surgery    [name] is here today to discuss chronic severe left knee pain due to DJD.  She recently saw Dr. Lynann Bologna for second pinon on her back and based on their discussion sounds like her left knee is troubling her more and she would like to have this addressed first.  She did well from a right total knee replacement years ago.  She has severe pain in the left knee that is functionally debilitating.  She has a lot of trouble with ADLs.  She cannot sleep at night due to the chronic pain.   Review of Systems  Constitutional: Negative.   HENT: Negative.   Eyes: Negative.     Respiratory: Negative.   Cardiovascular: Negative.   Endocrine: Negative.   Musculoskeletal: Negative.   Neurological: Negative.   Hematological: Negative.   Psychiatric/Behavioral: Negative.   All other systems reviewed and are negative.    Objective: Vital Signs: Ht 5\' 6"  (1.676 m)   Wt 213 lb (96.6 kg)   BMI 34.38 kg/m   Physical Exam Vitals and nursing note reviewed.  Constitutional:      Appearance: She is well-developed.  Pulmonary:     Effort: Pulmonary effort is normal.  Skin:    General: Skin is warm.     Capillary Refill: Capillary refill takes less than 2 seconds.  Neurological:     Mental Status: She is alert and oriented to person, place, and time.  Psychiatric:        Behavior: Behavior normal.        Thought Content: Thought content normal.        Judgment: Judgment normal.     Ortho Exam Exam stable  Specialty Comments:  No specialty comments available.  Imaging: No results found.   PMFS History: Patient Active Problem List   Diagnosis Date Noted  . Primary osteoarthritis of left knee 04/14/2020  . Chronic pain of left knee 06/26/2019  . Lumbar  radiculopathy 06/26/2019  . Left-sided low back pain with left-sided sciatica 06/26/2019  . Thoracic spinal stenosis 04/18/2019  . Seasonal allergies 12/04/2018  . Allergic rhinoconjunctivitis 10/10/2018  . Moderate persistent asthma with acute exacerbation 10/10/2018  . Pruritus 10/10/2018  . Aortic valve sclerosis 10/06/2018  . Bilateral carotid bruits 10/06/2018  . Abnormal stress ECG 10/06/2018  . Laboratory examination 10/06/2018  . Palpitations 10/06/2018  . Total knee replacement status 09/19/2017  . Sprain of anterior talofibular ligament of right ankle 12/13/2016   Past Medical History:  Diagnosis Date  . Abnormal stress ECG 10/06/2018  . Aortic valve sclerosis 10/06/2018  . Arthritis   . Asthma   . Bilateral carotid bruits 10/06/2018  . Headache    H/O bad HA, since using CPAP-  she has had improvement with that issue   . Hypertension   . Laboratory examination 10/06/2018  . OSA on CPAP    settting - 8, uses CPAP q night   . Shingles     Family History  Problem Relation Age of Onset  . Breast cancer Sister     Past Surgical History:  Procedure Laterality Date  . CHOLECYSTECTOMY  1998  . TOTAL KNEE ARTHROPLASTY Right 09/19/2017   Procedure: RIGHT TOTAL KNEE ARTHROPLASTY;  Surgeon: Leandrew Koyanagi, MD;  Location: [facility];  Service: Orthopedics;  Laterality: Right;  . TUBAL LIGATION     Social History   Occupational History  . Not on file  Tobacco Use  . Smoking status: Never Smoker  . Smokeless tobacco: Never Used  Vaping Use  . Vaping Use: Never used  Substance and Sexual Activity  . Alcohol use: Yes    Comment: sometimes wine  . Drug use: No  . Sexual activity: Not on file

## 2020-04-15 DIAGNOSIS — I10 Essential (primary) hypertension: Secondary | ICD-10-CM | POA: Diagnosis not present

## 2020-04-15 DIAGNOSIS — R0600 Dyspnea, unspecified: Secondary | ICD-10-CM | POA: Diagnosis not present

## 2020-04-15 DIAGNOSIS — E1169 Type 2 diabetes mellitus with other specified complication: Secondary | ICD-10-CM | POA: Diagnosis not present

## 2020-04-15 DIAGNOSIS — T40605A Adverse effect of unspecified narcotics, initial encounter: Secondary | ICD-10-CM | POA: Diagnosis not present

## 2020-04-15 DIAGNOSIS — Z23 Encounter for immunization: Secondary | ICD-10-CM | POA: Diagnosis not present

## 2020-04-15 DIAGNOSIS — G894 Chronic pain syndrome: Secondary | ICD-10-CM | POA: Diagnosis not present

## 2020-04-15 DIAGNOSIS — M1712 Unilateral primary osteoarthritis, left knee: Secondary | ICD-10-CM | POA: Diagnosis not present

## 2020-04-15 DIAGNOSIS — R110 Nausea: Secondary | ICD-10-CM | POA: Diagnosis not present

## 2020-04-15 LAB — PREALBUMIN: Prealbumin: 30 mg/dL (ref 17–34)

## 2020-04-18 ENCOUNTER — Encounter: Payer: Medicare Other | Admitting: Physical Therapy

## 2020-04-18 ENCOUNTER — Other Ambulatory Visit: Payer: Self-pay | Admitting: Internal Medicine

## 2020-04-18 DIAGNOSIS — Z1231 Encounter for screening mammogram for malignant neoplasm of breast: Secondary | ICD-10-CM

## 2020-04-21 ENCOUNTER — Encounter: Payer: Medicare Other | Admitting: Physical Therapy

## 2020-04-22 NOTE — Progress Notes (Signed)
Office Visit Note  Patient: Rachel Crane             Date of Birth: 09-Jun-1946           MRN: [id number]             PCP: Leeroy Cha, MD Referring: Leandrew Koyanagi, MD Visit Date: 04/25/2020   Subjective:  Chief complaint: left knee pain  History of Present Illness: Rachel Crane is a 74 y.o. female here for arthritis with hyperurecemia and positive ANA.  She reports a history of joint pain involving her shoulders, low back, and bilateral knees.  She has significant bilateral knee osteoarthritis and had a right knee replacement 2 years ago.  The back pain developed a few months ago with a sudden onset causing severe pain on the left side that initially prompted her to be evaluated the emergency department and subsequently followed up in outpatient clinic.  Imaging demonstrated some spondylosis and spinal stenosis. She had injections and use of muscle relaxants with good relief of the symptoms. She also experienced significant bilateral shoulder pain that was greatly improved by injections.  Now today her only major issue is still the left knee.  This is often associated with effusions and she wears a brace due to pain and instability walking on this knee.  She denies any history of very swollen, red, or painful joints elsewhere such as the feet and toes.  She does not know of any history of gout attacks in the past.  She denies generalized joint pain.  Previous lab values reviewed include the following  ANA 1:80 nuclear, speckled  1:40 homogenous RF negative ESR 25 Uric acid 7.8   Activities of Daily Living:  Patient reports morning stiffness for 10 minutes.   Patient Denies nocturnal pain.  Difficulty dressing/grooming: Denies Difficulty climbing stairs: Reports Difficulty getting out of chair: Reports Difficulty using hands for taps, buttons, cutlery, and/or writing: Denies  Review of Systems  Constitutional: Negative for fatigue.  HENT: Negative for mouth sores, mouth  dryness and nose dryness.   Eyes: Negative for pain, itching and dryness.  Respiratory: Positive for shortness of breath. Negative for cough, hemoptysis and difficulty breathing.   Cardiovascular: Negative for chest pain, palpitations and swelling in legs/feet.  Gastrointestinal: Negative for abdominal pain, blood in stool, constipation and diarrhea.  Endocrine: Negative for increased urination.  Genitourinary: Negative for painful urination.  Musculoskeletal: Positive for arthralgias, joint pain, myalgias, muscle weakness, morning stiffness, muscle tenderness and myalgias. Negative for joint swelling.  Skin: Negative for color change, rash and redness.  Allergic/Immunologic: Negative for susceptible to infections.  Neurological: Negative for dizziness, headaches, memory loss and weakness.  Hematological: Negative for swollen glands.  Psychiatric/Behavioral: Negative for confusion and sleep disturbance.    PMFS History:  Patient Active Problem List   Diagnosis Date Noted  . Positive ANA (antinuclear antibody) 04/25/2020  . Primary osteoarthritis of left knee 04/14/2020  . Chronic pain of left knee 06/26/2019  . Lumbar radiculopathy 06/26/2019  . Left-sided low back pain with left-sided sciatica 06/26/2019  . Thoracic spinal stenosis 04/18/2019  . Seasonal allergies 12/04/2018  . Allergic rhinoconjunctivitis 10/10/2018  . Moderate persistent asthma with acute exacerbation 10/10/2018  . Pruritus 10/10/2018  . Aortic valve sclerosis 10/06/2018  . Bilateral carotid bruits 10/06/2018  . Abnormal stress ECG 10/06/2018  . Laboratory examination 10/06/2018  . Palpitations 10/06/2018  . Total knee replacement status 09/19/2017  . Sprain of anterior talofibular ligament of right ankle 12/13/2016  Past Medical History:  Diagnosis Date  . Abnormal stress ECG 10/06/2018  . Aortic valve sclerosis 10/06/2018  . Arthritis   . Asthma   . Bilateral carotid bruits 10/06/2018  . Headache     H/O bad HA, since using CPAP- she has had improvement with that issue   . Hypertension   . Laboratory examination 10/06/2018  . OSA on CPAP    settting - 8, uses CPAP q night   . Shingles     Family History  Problem Relation Age of Onset  . Breast cancer Sister   . Hypertension Mother   . Arthritis Mother    Past Surgical History:  Procedure Laterality Date  . CHOLECYSTECTOMY  1998  . TOTAL KNEE ARTHROPLASTY Right 09/19/2017   Procedure: RIGHT TOTAL KNEE ARTHROPLASTY;  Surgeon: Leandrew Koyanagi, MD;  Location: [facility];  Service: Orthopedics;  Laterality: Right;  . TUBAL LIGATION     Social History   Social History Narrative  . Not on file   Immunization History  Administered Date(s) Administered  . Influenza,inj,Quad PF,6+ Mos 05/12/2018  . Moderna SARS-COVID-2 Vaccination 09/10/2019, 10/12/2019  . Pneumococcal Conjugate-13 05/12/2018  . Tdap 02/18/2017     Objective: Vital Signs: BP (!) 141/68 (BP Location: Left Arm, Patient Position: Sitting, Cuff Size: Small)   Pulse 71   Ht 5' 5"  (1.651 m)   Wt 212 lb (96.2 kg) Comment: Per patient - at home this morning  BMI 35.28 kg/m    Physical Exam HENT:     Right Ear: External ear normal.     Left Ear: External ear normal.  Cardiovascular:     Rate and Rhythm: Normal rate and regular rhythm.  Neurological:     General: No focal deficit present.     Mental Status: She is alert.     Comments: Left knee pain is limited by pain compared to right but both grossly intact      Musculoskeletal Exam:  Neck full range of motion no tenderness Shoulder, elbow, wrist, fingers full range of motion no tenderness or swelling No paraspinal tenderness to palpation over upper and lower back Normal hip internal and external rotation without pain, no tenderness to lateral hip palpation Right knee normal range of motion no tenderness or swelling, surgical scar well-healed Left knee tender and warm to touch with swelling over medial aspect,  patellofemoral crepitus present range of motion slightly restricted Ankles and feet normal range of motion no tenderness or swelling   Investigation: No additional findings.  Imaging: US Guided Needle Placement  Result Date: 04/25/2020 Ultrasound-guided needle placement of left knee aspiration First frame transverse view of medial suprapatellar pouch.  Second and third frame demonstrate needle placement at surface and inside suprapatellar pouch effusion. Impression Ultrasound confirmed successful needle placement  NCV with EMG (electromyography)  Result Date: 04/08/2020 Magnus Sinning, MD     04/12/2020  5:40 AM EMG & NCV Findings: Evaluation of the right ulnar motor nerve showed reduced amplitude (2.2 mV).  The right median (across palm) sensory nerve showed prolonged distal peak latency (Wrist, 4.2 ms) and prolonged distal peak latency (Palm, 3.9 ms).  All remaining nerves (as indicated in the following tables) were within normal limits.  All examined muscles (as indicated in the following table) showed no evidence of electrical instability.  Impression: The above electrodiagnostic study is ABNORMAL and reveals evidence of a mild right median nerve entrapment at the wrist (carpal tunnel syndrome) affecting sensory components. There is no significant electrodiagnostic  evidence of any other focal nerve entrapment, brachial plexopathy, cervical radiculopathy or generalized peripheral neuropathy.  **This electrodiagnostic study cannot rule out small fiber polyneuropathy and dysesthesias from central pain syndromes such as stroke or central pain sensitization syndromes such as fibromyalgia.  Myotomal referral pain from trigger points is also not excluded. Recommendations: 1.  Follow-up with referring physician. 2.  Continue current management of symptoms. 3.  Suggest use of resting splint at night-time and as needed during the day. ___________________________ [employer] Board Certified, American  Board of Physical Medicine and Rehabilitation Nerve Conduction Studies Anti Sensory Summary Table  Stim Site NR Peak (ms) Norm Peak (ms) P-T Amp (V) Norm P-T Amp Site1 Site2 Delta-P (ms) Dist (cm) Vel (m/s) Norm Vel (m/s) Right Median Acr Palm Anti Sensory (2nd Digit)  31.5C Wrist    *4.2 <3.6 17.5 >10 Wrist Palm 0.3 0.0   Palm    *3.9 <2.0 2.3        Right Radial Anti Sensory (Base 1st Digit)  31.6C Wrist    2.2 <3.1 22.9  Wrist Base 1st Digit 2.2 0.0   Right Ulnar Anti Sensory (5th Digit)  31.9C Wrist    3.4 <3.7 18.2 >15.0 Wrist 5th Digit 3.4 14.0 41 >38 Motor Summary Table  Stim Site NR Onset (ms) Norm Onset (ms) O-P Amp (mV) Norm O-P Amp Site1 Site2 Delta-0 (ms) Dist (cm) Vel (m/s) Norm Vel (m/s) Right Median Motor (Abd Poll Brev)  31.7C Wrist    4.2 <4.2 8.3 >5 Elbow Wrist 4.2 22.0 52 >50 Elbow    8.4  7.6        Right Ulnar Motor (Abd Dig Min)  31.9C Wrist    2.8 <4.2 *2.2 >3 B Elbow Wrist 3.5 22.0 63 >53 B Elbow    6.3  7.7  A Elbow B Elbow 1.5 11.0 73 >53 A Elbow    7.8  7.7        EMG  Side Muscle Nerve Root Ins Act Fibs Psw Amp Dur Poly Recrt Int Fraser Din Comment Right Abd Poll Brev Median C8-T1 Nml Nml Nml Nml Nml 0 Nml Nml  Right 1stDorInt Ulnar C8-T1 Nml Nml Nml Nml Nml 0 Nml Nml  Right PronatorTeres Median C6-7 Nml Nml Nml Nml Nml 0 Nml Nml  Right Biceps Musculocut C5-6 Nml Nml Nml Nml Nml 0 Nml Nml  Right Deltoid Axillary C5-6 Nml Nml Nml Nml Nml 0 Nml Nml  Nerve Conduction Studies Anti Sensory Left/Right Comparison  Stim Site L Lat (ms) R Lat (ms) L-R Lat (ms) L Amp (V) R Amp (V) L-R Amp (%) Site1 Site2 L Vel (m/s) R Vel (m/s) L-R Vel (m/s) Median Acr Palm Anti Sensory (2nd Digit)  31.5C Wrist  *4.2   17.5  Wrist Palm    Palm  *3.9   2.3       Radial Anti Sensory (Base 1st Digit)  31.6C Wrist  2.2   22.9  Wrist Base 1st Digit    Ulnar Anti Sensory (5th Digit)  31.9C Wrist  3.4   18.2  Wrist 5th Digit  41  Motor Left/Right Comparison  Stim Site L Lat (ms) R Lat (ms) L-R Lat (ms) L Amp (mV) R  Amp (mV) L-R Amp (%) Site1 Site2 L Vel (m/s) R Vel (m/s) L-R Vel (m/s) Median Motor (Abd Poll Brev)  31.7C Wrist  4.2   8.3  Elbow Wrist  52  Elbow  8.4   7.6  Ulnar Motor (Abd Dig Min)  31.9C Wrist  2.8   *2.2  B Elbow Wrist  63  B Elbow  6.3   7.7  A Elbow B Elbow  73  A Elbow  7.8   7.7       Waveforms:         Recent Labs: Lab Results  Component Value Date   WBC 7.7 04/18/2019   HGB 11.1 (L) 04/18/2019   PLT 220 04/18/2019   NA 137 04/17/2019   K 3.5 04/17/2019   CL 104 04/17/2019   CO2 23 04/17/2019   GLUCOSE 108 (H) 04/17/2019   BUN 19 04/17/2019   CREATININE 1.08 (H) 04/18/2019   BILITOT 0.8 04/17/2019   ALKPHOS 68 04/17/2019   AST 26 04/17/2019   ALT 29 04/17/2019   PROT 7.4 04/17/2019   ALBUMIN 4.0 04/17/2019   CALCIUM 9.3 04/17/2019   GFRAA 60 (L) 04/18/2019    Speciality Comments: No specialty comments available.  Procedures:  No procedures performed Allergies: Hydrocodone-acetaminophen   Assessment / Plan:     Visit Diagnoses: Chronic pain of left knee - Plan: US Guided Needle Placement, Gram stain, Anaerobic and Aerobic Culture, Synovial cell count + diff, w/ crystals  Rachel Crane has persistent left knee pain with degenerative changes seen from her previous x-rays including medial and patellofemoral compartment.  Joint effusion today could be secondary to osteoarthritis recommended aspiration to exclude inflammatory or infectious process. If fluid analysis is bland effusion can be present fairly commonly in severe OA disease which she has.  Positive ANA (antinuclear antibody) - Plan: Anti-DNA antibody, double-stranded, Anti-Smith antibody  Review of systems and patient's history does not support clinical criteria of systemic lupus.  She does have arthralgia multiple sites including 1 site with active swelling warmth and effusion today.  We will check antibody test more specific for lupus if these are negative would not suggest clinical monitoring for this  isolated positive lab test.  Hyperurecemia  Hyperuricemia was demonstrated but not very confident this clinically represents gout.  However intracellular MSU crystals would be a significant finding for this if present.   Procedure Note: Consent was obtained patient allergies were confirmed. The left knee was prepped with betadine and 2 mL 2% xylocaine was used as local anesthetic. The joint was entered with needle placement confirmed by ultrasound guidance and 13 ml's of yellow colored fluid was withdrawn and sent for cell count, gram stain, culture, and crystal analysis. The procedure was well tolerated.  It may be more painful for the first 1-2 days. Patient instructed to call or return to clinic if worsening pain redness or swelling after the procedure.   Orders: Orders Placed This Encounter  Procedures  . Gram stain  . Anaerobic and Aerobic Culture  . US Guided Needle Placement  . Anti-DNA antibody, double-stranded  . Anti-Smith antibody  . Synovial cell count + diff, w/ crystals   No orders of the defined types were placed in this encounter.    Follow-Up Instructions: Return in about 4 weeks (around 05/23/2020).   Collier Salina, MD  Note - This record has been created using [employer].  Chart creation errors have been sought, but may not always  have been located. Such creation errors do not reflect on  the standard of medical care.

## 2020-04-25 ENCOUNTER — Ambulatory Visit: Payer: Self-pay

## 2020-04-25 ENCOUNTER — Encounter: Payer: Self-pay | Admitting: Cardiology

## 2020-04-25 ENCOUNTER — Other Ambulatory Visit: Payer: Self-pay

## 2020-04-25 ENCOUNTER — Ambulatory Visit: Payer: Medicare Other | Admitting: Cardiology

## 2020-04-25 ENCOUNTER — Ambulatory Visit (INDEPENDENT_AMBULATORY_CARE_PROVIDER_SITE_OTHER): Payer: Medicare Other | Admitting: Internal Medicine

## 2020-04-25 ENCOUNTER — Encounter: Payer: Self-pay | Admitting: Internal Medicine

## 2020-04-25 VITALS — BP 135/68 | HR 80 | Resp 16 | Ht 65.0 in | Wt 215.0 lb

## 2020-04-25 VITALS — BP 141/68 | HR 71 | Ht 65.0 in | Wt 212.0 lb

## 2020-04-25 DIAGNOSIS — R768 Other specified abnormal immunological findings in serum: Secondary | ICD-10-CM | POA: Diagnosis not present

## 2020-04-25 DIAGNOSIS — G8929 Other chronic pain: Secondary | ICD-10-CM | POA: Diagnosis not present

## 2020-04-25 DIAGNOSIS — I10 Essential (primary) hypertension: Secondary | ICD-10-CM

## 2020-04-25 DIAGNOSIS — M25562 Pain in left knee: Secondary | ICD-10-CM

## 2020-04-25 DIAGNOSIS — E782 Mixed hyperlipidemia: Secondary | ICD-10-CM | POA: Diagnosis not present

## 2020-04-25 DIAGNOSIS — Z6835 Body mass index (BMI) 35.0-35.9, adult: Secondary | ICD-10-CM

## 2020-04-25 DIAGNOSIS — Z01810 Encounter for preprocedural cardiovascular examination: Secondary | ICD-10-CM

## 2020-04-25 DIAGNOSIS — E790 Hyperuricemia without signs of inflammatory arthritis and tophaceous disease: Secondary | ICD-10-CM

## 2020-04-25 NOTE — Progress Notes (Signed)
Rachel Crane Date of Birth: 1945/07/28 MRN: [id number] Primary Care Provider:Varadarajan, Ronie Spies, MD Former Cardiology Providers: Jeri Lager, APRN, FNP-C Primary Cardiologist: Rex Kras, DO, [facility] (established care 04/26/2020)  Date: 04/25/20 Last Visit: 11/21/2018  Chief Complaint  Patient presents with  . Abnormal ECG  . Follow-up    HPI  Rachel Crane is a 74 y.o.  female who presents to the office with a chief complaint of " abnormal EKG and preoperative risk stratification." Patient's past medical history and cardiovascular risk factors include: Hypertension, Hyperlipidemia, prediabetes, sleep apnea currently on CPAP, postmenopausal female, advanced age.  Patient was being followed by Miquel Dunn and last seen in the office in May 2020 and at that time was asked to follow-up on as needed basis.  She recently went to her primary care provider's office and was noted to have an abnormal EKG and prior to being considered for left knee replacement is referred to cardiology for further evaluation and management.  I do not have that EKG to review at today's office visit.  EKG in the office shows sinus rhythm at 73 bpm without underlying ischemia or injury pattern.  No known coronary artery disease or prior myocardial infarction.  No recent episode of acute coronary syndrome, no history of significant valvular heart disease, no history of congestive heart failure, patient is not diabetic.  In regards to functional status patient does not participate in any regular exercise program or daily routine due to knee and back pain.  However, patient states that she still works at [employer] and is able to garden in her free time.   Denies prior history of coronary artery disease, myocardial infarction, congestive heart failure, deep venous thrombosis, pulmonary embolism, stroke, transient ischemic attack.  ALLERGIES: Allergies  Allergen Reactions  . Hydrocodone-Acetaminophen     Hives and nausea    MEDICATION LIST PRIOR TO VISIT: Current Outpatient Medications on File Prior to Visit  Medication Sig Dispense Refill  . Albuterol Sulfate, sensor, (PROAIR DIGIHALER) 108 (90 Base) MCG/ACT AEPB Inhale 1-2 puffs into the lungs every 4 (four) hours as needed. 3 each 0  . aspirin EC 81 MG tablet Take 1 tablet (81 mg total) by mouth daily. 90 tablet 3  . azelastine (ASTELIN) 0.1 % nasal spray Place 2 sprays into both nostrils 2 (two) times daily. (Patient taking differently: Place 2 sprays into both nostrils as needed. ) 30 mL 5  . budesonide-formoterol (SYMBICORT) 80-4.5 MCG/ACT inhaler 2 Puffs 2 times daily (Patient taking differently: Inhale 2 puffs into the lungs as needed. ) 1 Inhaler 5  . chlorthalidone (HYGROTON) 25 MG tablet Take 25 mg by mouth every morning.    . fluticasone (FLONASE) 50 MCG/ACT nasal spray Place 2 sprays into both nostrils daily. (Patient taking differently: Place 2 sprays into both nostrils as needed. ) 16 g 5  . gabapentin (NEURONTIN) 300 MG capsule Take 1 capsule (300 mg total) by mouth 3 (three) times daily. 270 capsule 3  . latanoprost (XALATAN) 0.005 % ophthalmic solution Place 1 drop into both eyes at bedtime.   10  . loratadine (CLARITIN) 10 MG tablet Take 1 tablet (10 mg total) by mouth daily. 90 tablet 1  . losartan (COZAAR) 100 MG tablet Take 1 tablet (100 mg total) by mouth daily. 30 tablet 3  . Multiple Vitamins-Minerals (WOMENS 50+ MULTI VITAMIN/MIN PO) Take by mouth daily.     . pravastatin (PRAVACHOL) 10 MG tablet Take 40 mg by mouth daily.     Marland Kitchen Zinc  50 MG CAPS Take 1 capsule by mouth daily.     No current facility-administered medications on file prior to visit.    PAST MEDICAL HISTORY: Past Medical History:  Diagnosis Date  . Abnormal stress ECG 10/06/2018  . Aortic valve sclerosis 10/06/2018  . Arthritis   . Asthma   . Bilateral carotid bruits 10/06/2018  . Headache    H/O bad HA, since using CPAP- she has had improvement with that issue   .  Hypertension   . Laboratory examination 10/06/2018  . OSA on CPAP    settting - 8, uses CPAP q night   . Shingles     PAST SURGICAL HISTORY: Past Surgical History:  Procedure Laterality Date  . CHOLECYSTECTOMY  1998  . TOTAL KNEE ARTHROPLASTY Right 09/19/2017   Procedure: RIGHT TOTAL KNEE ARTHROPLASTY;  Surgeon: Leandrew Koyanagi, MD;  Location: [place];  Service: Orthopedics;  Laterality: Right;  . TUBAL LIGATION      FAMILY HISTORY: The patient's family history includes Arthritis in her mother; Breast cancer in her sister; Hypertension in her mother.   SOCIAL HISTORY:  The patient  reports that she has never smoked. She has never used smokeless tobacco. She reports previous alcohol use. She reports that she does not use drugs.  Review of Systems  Constitutional: Negative for chills and fever.  HENT: Negative for hoarse voice and nosebleeds.   Eyes: Negative for discharge, double vision and pain.  Cardiovascular: Negative for chest pain, claudication, dyspnea on exertion, leg swelling, near-syncope, orthopnea, palpitations, paroxysmal nocturnal dyspnea and syncope.  Respiratory: Negative for hemoptysis and shortness of breath.   Musculoskeletal: Negative for muscle cramps and myalgias.  Gastrointestinal: Negative for abdominal pain, constipation, diarrhea, hematemesis, hematochezia, melena, nausea and vomiting.  Neurological: Negative for dizziness and light-headedness.    PHYSICAL EXAM: Vitals with BMI 04/25/2020 04/25/2020 04/25/2020  Height 5\' 5"  - 5\' 5"   Weight 215 lbs - 212 lbs  BMI 46.56 - 81.27  Systolic 517 001 749  Diastolic 68 68 67  Pulse 80 71 76   CONSTITUTIONAL: Well-developed and well-nourished. No acute distress.  SKIN: Skin is warm and dry. No rash noted. No cyanosis. No pallor. No jaundice HEAD: Normocephalic and atraumatic.  EYES: No scleral icterus MOUTH/THROAT: Moist oral membranes.  NECK: No JVD present. No thyromegaly noted. No carotid bruits  LYMPHATIC:  No visible cervical adenopathy.  CHEST Normal respiratory effort. No intercostal retractions  LUNGS: Clear to auscultation bilaterally.  No stridor. No wheezes. No rales.  CARDIOVASCULAR: Regular rate and rhythm, positive S1-S2, no murmurs rubs or gallops appreciated. ABDOMINAL: Obese, soft, nontender, nondistended, positive bowel sounds in all 4 quadrants, no apparent ascites.  EXTREMITIES: No peripheral edema  HEMATOLOGIC: No significant bruising NEUROLOGIC: Oriented to person, place, and time. Nonfocal. Normal muscle tone.  PSYCHIATRIC: Normal mood and affect. Normal behavior. Cooperative  CARDIAC DATABASE: EKG: 04/25/2020: Normal sinus rhythm, 73 bpm, normal axis, without underlying ischemia or injury pattern.   Echocardiogram:  01/19/2017: LVEF 44%, grade 2 diastolic impairment, moderate LVH, trace MR, trace TR.   Stress Testing:  No results found for this or any previous visit from the past 1095 days.  Heart Catheterization: None  LABORATORY DATA: CBC Latest Ref Rng & Units 04/18/2019 04/17/2019 10/06/2018  WBC 4.0 - 10.5 K/uL 7.7 7.6 6.4  Hemoglobin 12.0 - 15.0 g/dL 11.1(L) 11.0(L) 11.2  Hematocrit 36 - 46 % 34.3(L) 34.4(L) 33.8(L)  Platelets 150 - 400 K/uL 220 219 -    CMP Latest  Ref Rng & Units 04/18/2019 04/17/2019 11/10/2018  Glucose 70 - 99 mg/dL - 108(H) 98  BUN 8 - 23 mg/dL - 19 15  Creatinine 0.44 - 1.00 mg/dL 1.08(H) 0.97 1.07(H)  Sodium 135 - 145 mmol/L - 137 144  Potassium 3.5 - 5.1 mmol/L - 3.5 3.6  Chloride 98 - 111 mmol/L - 104 102  CO2 22 - 32 mmol/L - 23 26  Calcium 8.9 - 10.3 mg/dL - 9.3 9.7  Total Protein 6.5 - 8.1 g/dL - 7.4 7.2  Total Bilirubin 0.3 - 1.2 mg/dL - 0.8 0.5  Alkaline Phos 38 - 126 U/L - 68 78  AST 15 - 41 U/L - 26 18  ALT 0 - 44 U/L - 29 18    Lipid Panel     Component Value Date/Time   CHOL 179 10/06/2018 1012   TRIG 76 10/06/2018 1012   HDL 50 10/06/2018 1012   CHOLHDL 3.6 10/06/2018 1012   LDLCALC 114 (H) 10/06/2018 1012    LABVLDL 15 10/06/2018 1012    No results found for: HGBA1C No components found for: NTPROBNP Lab Results  Component Value Date   TSH 0.980 10/23/2018   TSH 1.200 05/12/2018   TSH 1.140 02/22/2017    Cardiac Panel (last 3 results) No results for input(s): CKTOTAL, CKMB, TROPONINIHS, RELINDX in the last 72 hours.  IMPRESSION:    ICD-10-CM   1. Pre-operative cardiovascular examination  Z01.810 PCV ECHOCARDIOGRAM COMPLETE    PCV MYOCARDIAL PERFUSION WITH LEXISCAN  2. Essential hypertension  I10 EKG 12-Lead  3. Mixed hyperlipidemia  E78.2   4. Class 2 severe obesity due to excess calories with serious comorbidity and body mass index (BMI) of 35.0 to 35.9 in adult [facility])  E66.01    Z68.35      RECOMMENDATIONS: Rachel Crane is a 74 y.o. female whose past medical history and cardiovascular risk factors include: Hypertension, Hyperlipidemia, prediabetes, sleep apnea currently on CPAP, postmenopausal female, advanced age.  Preoperative risk stratification:  Patient is currently being worked up for left knee replacement and is referred to cardiology for further evaluation due to an abnormal EKG at her PCPs office.  As noted above I do not have the EKG to review.  EKG in the office shows normal sinus rhythm without underlying ischemia or injury pattern.  Unable to accurately assess her functional status as her activity level is limited due to knee pain and back pain.  Her last ischemic evaluation was an echocardiogram back in 2018.  Prior to upcoming noncardiac surgery would recommend echocardiogram to evaluate for LVEF and structural heart disease and stress test to rule out reversible ischemia given her multiple risk factors as noted above.  Risk stratification forthcoming.  Benign essential hypertension: Office blood pressure is very well controlled.  Medications reconciled.  Mixed hyperlipidemia: Continue statin therapy.  Currently managed by primary care provider.  FINAL  MEDICATION LIST END OF ENCOUNTER: No orders of the defined types were placed in this encounter.    Current Outpatient Medications:  .  Albuterol Sulfate, sensor, (PROAIR DIGIHALER) 108 (90 Base) MCG/ACT AEPB, Inhale 1-2 puffs into the lungs every 4 (four) hours as needed., Disp: 3 each, Rfl: 0 .  aspirin EC 81 MG tablet, Take 1 tablet (81 mg total) by mouth daily., Disp: 90 tablet, Rfl: 3 .  azelastine (ASTELIN) 0.1 % nasal spray, Place 2 sprays into both nostrils 2 (two) times daily. (Patient taking differently: Place 2 sprays into both nostrils as needed. ), Disp: 30  mL, Rfl: 5 .  budesonide-formoterol (SYMBICORT) 80-4.5 MCG/ACT inhaler, 2 Puffs 2 times daily (Patient taking differently: Inhale 2 puffs into the lungs as needed. ), Disp: 1 Inhaler, Rfl: 5 .  chlorthalidone (HYGROTON) 25 MG tablet, Take 25 mg by mouth every morning., Disp: , Rfl:  .  fluticasone (FLONASE) 50 MCG/ACT nasal spray, Place 2 sprays into both nostrils daily. (Patient taking differently: Place 2 sprays into both nostrils as needed. ), Disp: 16 g, Rfl: 5 .  gabapentin (NEURONTIN) 300 MG capsule, Take 1 capsule (300 mg total) by mouth 3 (three) times daily., Disp: 270 capsule, Rfl: 3 .  latanoprost (XALATAN) 0.005 % ophthalmic solution, Place 1 drop into both eyes at bedtime. , Disp: , Rfl: 10 .  loratadine (CLARITIN) 10 MG tablet, Take 1 tablet (10 mg total) by mouth daily., Disp: 90 tablet, Rfl: 1 .  losartan (COZAAR) 100 MG tablet, Take 1 tablet (100 mg total) by mouth daily., Disp: 30 tablet, Rfl: 3 .  Multiple Vitamins-Minerals (WOMENS 50+ MULTI VITAMIN/MIN PO), Take by mouth daily. , Disp: , Rfl:  .  pravastatin (PRAVACHOL) 10 MG tablet, Take 40 mg by mouth daily. , Disp: , Rfl:  .  Zinc 50 MG CAPS, Take 1 capsule by mouth daily., Disp: , Rfl:   Orders Placed This Encounter  Procedures  . PCV MYOCARDIAL PERFUSION WITH LEXISCAN  . EKG 12-Lead  . PCV ECHOCARDIOGRAM COMPLETE    --Continue cardiac medications as  reconciled in final medication list. --Return in about 6 months (around 10/24/2020) for Post-op or as needed. . Or sooner if needed. --Continue follow-up with your primary care physician regarding the management of your other chronic comorbid conditions.  Patient's questions and concerns were addressed to her satisfaction. She voices understanding of the instructions provided during this encounter.   This note was created using a voice recognition software as a result there may be grammatical errors inadvertently enclosed that do not reflect the nature of this encounter. Every attempt is made to correct such errors.  Rex Kras, [place], [facility]  Pager: [phone] Office: [phone]

## 2020-04-26 ENCOUNTER — Other Ambulatory Visit: Payer: Self-pay

## 2020-04-26 DIAGNOSIS — Z01810 Encounter for preprocedural cardiovascular examination: Secondary | ICD-10-CM

## 2020-04-26 LAB — CELL COUNT + DIFF, W/O CRYST-SYNVL FLD
Basophils, %: 0 %
Eosinophils-Synovial: 0 % (ref 0–2)
Lymphocytes-Synovial Fld: 94 % — ABNORMAL HIGH (ref 0–74)
Monocyte/Macrophage: 0 % (ref 0–69)
Neutrophil, Synovial: 3 % (ref 0–24)
Synoviocytes, %: 3 % (ref 0–15)
WBC, Synovial: 360 cells/uL — ABNORMAL HIGH (ref <150)

## 2020-04-26 LAB — GRAM STAIN
GRAM STAIN:: NONE SEEN
MICRO NUMBER:: 11060897
SPECIMEN QUALITY:: ADEQUATE

## 2020-04-26 LAB — SYNOVIAL FLUID, CRYSTAL

## 2020-04-26 LAB — ANTI-DNA ANTIBODY, DOUBLE-STRANDED: ds DNA Ab: <1 IU/mL

## 2020-04-26 LAB — ANTI-SMITH ANTIBODY: ENA SM Ab Ser-aCnc: <1 AI

## 2020-04-28 ENCOUNTER — Ambulatory Visit: Payer: Medicare Other

## 2020-04-28 ENCOUNTER — Other Ambulatory Visit: Payer: Self-pay

## 2020-04-28 DIAGNOSIS — Z01810 Encounter for preprocedural cardiovascular examination: Secondary | ICD-10-CM | POA: Diagnosis not present

## 2020-05-01 LAB — ANAEROBIC AND AEROBIC CULTURE
AER RESULT:: NO GROWTH
GRAM STAIN:: NONE SEEN
MICRO NUMBER:: 11058835
MICRO NUMBER:: 11058836
SPECIMEN QUALITY:: ADEQUATE
SPECIMEN QUALITY:: ADEQUATE

## 2020-05-02 ENCOUNTER — Ambulatory Visit: Payer: Medicare Other

## 2020-05-02 ENCOUNTER — Other Ambulatory Visit: Payer: Self-pay

## 2020-05-02 DIAGNOSIS — Z01810 Encounter for preprocedural cardiovascular examination: Secondary | ICD-10-CM | POA: Diagnosis not present

## 2020-05-03 ENCOUNTER — Ambulatory Visit
Admission: RE | Admit: 2020-05-03 | Discharge: 2020-05-03 | Disposition: A | Discharge status: Home / Self Care | Payer: Medicare Other | Source: Ambulatory Visit | Attending: Internal Medicine | Admitting: Internal Medicine

## 2020-05-03 DIAGNOSIS — Z1231 Encounter for screening mammogram for malignant neoplasm of breast: Secondary | ICD-10-CM

## 2020-05-09 NOTE — Progress Notes (Signed)
No answer left a vm will try again later

## 2020-05-10 NOTE — Progress Notes (Signed)
Left detailed msg regarding results.

## 2020-05-16 ENCOUNTER — Ambulatory Visit: Payer: Medicare Other | Admitting: Internal Medicine

## 2020-05-23 NOTE — Progress Notes (Signed)
Office Visit Note  Patient: Rachel Crane             Date of Birth: 1946/06/25           MRN: [id number]             PCP: Leeroy Cha, MD Referring: Leeroy Cha,* Visit Date: 05/24/2020  Subjective:  Pain and Edema of the Left Knee and Follow-up   History of Present Illness: Rachel Crane is a 74 y.o. female for follow up of joint pain. She had joint aspiration showing noninflammatory synovial fluid and ENA serology was negative. Knee effusion was negative for MSU crystals so while she does have elevated uric acid I do not think gout was causing the current effusion and pain episode. Based on this assessment her knee pain and effusion is consistent with osteoarthritis and can proceed for TKA without additional intervention.   Activities of Daily Living:  Patient reports morning stiffness for 2 hours.   Patient Reports nocturnal pain.  Difficulty dressing/grooming: Denies Difficulty climbing stairs: Reports Difficulty getting out of chair: Reports Difficulty using hands for taps, buttons, cutlery, and/or writing: Denies  Review of Systems  Constitutional: Negative for fatigue.  HENT: Negative for mouth dryness and nose dryness.   Eyes: Negative for dryness.  Respiratory: Negative for cough, hemoptysis, shortness of breath and difficulty breathing.   Cardiovascular: Negative for chest pain, palpitations and swelling in legs/feet.  Gastrointestinal: Negative for abdominal pain, blood in stool, constipation and diarrhea.  Endocrine: Negative for increased urination.  Genitourinary: Negative for painful urination.  Musculoskeletal: Positive for arthralgias, joint pain, joint swelling, myalgias, muscle weakness, morning stiffness, muscle tenderness and myalgias.  Skin: Negative for color change, rash and redness.  Allergic/Immunologic: Negative for susceptible to infections.  Neurological: Negative for dizziness, numbness, headaches, memory loss and weakness.    Hematological: Negative for swollen glands.  Psychiatric/Behavioral: Positive for sleep disturbance. Negative for confusion.    PMFS History:  Patient Active Problem List   Diagnosis Date Noted  . Positive ANA (antinuclear antibody) 04/25/2020  . Primary osteoarthritis of left knee 04/14/2020  . Lumbar radiculopathy 06/26/2019  . Left-sided low back pain with left-sided sciatica 06/26/2019  . Thoracic spinal stenosis 04/18/2019  . Seasonal allergies 12/04/2018  . Allergic rhinoconjunctivitis 10/10/2018  . Moderate persistent asthma with acute exacerbation 10/10/2018  . Pruritus 10/10/2018  . Aortic valve sclerosis 10/06/2018  . Bilateral carotid bruits 10/06/2018  . Abnormal stress ECG 10/06/2018  . Laboratory examination 10/06/2018  . Palpitations 10/06/2018  . Total knee replacement status 09/19/2017  . Sprain of anterior talofibular ligament of right ankle 12/13/2016    Past Medical History:  Diagnosis Date  . Abnormal stress ECG 10/06/2018  . Aortic valve sclerosis 10/06/2018  . Arthritis   . Asthma   . Bilateral carotid bruits 10/06/2018  . Headache    H/O bad HA, since using CPAP- she has had improvement with that issue   . Hypertension   . Laboratory examination 10/06/2018  . OSA on CPAP    settting - 8, uses CPAP q night   . Shingles     Family History  Problem Relation Age of Onset  . Breast cancer Sister   . Hypertension Mother   . Arthritis Mother    Past Surgical History:  Procedure Laterality Date  . CHOLECYSTECTOMY  1998  . TOTAL KNEE ARTHROPLASTY Right 09/19/2017   Procedure: RIGHT TOTAL KNEE ARTHROPLASTY;  Surgeon: Leandrew Koyanagi, MD;  Location: [place];  Service: Orthopedics;  Laterality: Right;  . TUBAL LIGATION     Social History   Social History Narrative  . Not on file   Immunization History  Administered Date(s) Administered  . Influenza,inj,Quad PF,6+ Mos 05/12/2018  . Moderna SARS-COVID-2 Vaccination 09/10/2019, 10/12/2019  . Pneumococcal  Conjugate-13 05/12/2018  . Tdap 02/18/2017     Objective: Vital Signs: BP 118/62 (BP Location: Right Arm, Patient Position: Sitting, Cuff Size: Small)   Pulse 71   Ht 5\' 6"  (1.676 m)   Wt 217 lb 6.4 oz (98.6 kg)   BMI 35.09 kg/m     Musculoskeletal Exam: Left knee patellofemoral crepitus present and range of motion is slightly restricted, with pain along medial side during flexion and with pressure Investigation: No additional findings.  Imaging: MM 3D SCREEN BREAST BILATERAL  Result Date: 05/05/2020 CLINICAL DATA:  Screening. EXAM: DIGITAL SCREENING BILATERAL MAMMOGRAM WITH TOMO AND CAD COMPARISON:  Previous exam(s). ACR Breast Density Category b: There are scattered areas of fibroglandular density. FINDINGS: There are no findings suspicious for malignancy. Images were processed with CAD. IMPRESSION: No mammographic evidence of malignancy. A result letter of this screening mammogram will be mailed directly to the patient. RECOMMENDATION: Screening mammogram in one year. (Code:SM-B-01Y) BI-RADS CATEGORY  1: Negative. Electronically Signed   By: Nolon Nations M.D.   On: 05/05/2020 16:36   PCV ECHOCARDIOGRAM COMPLETE  Result Date: 04/28/2020 Echocardiogram 04/28/2020: Left ventricle cavity is normal in size. Moderate concentric hypertrophy of the left ventricle. Normal global wall motion. Normal LV systolic function with EF 64%. Doppler evidence of grade I (impaired) diastolic dysfunction, normal LAP. Left atrial cavity is mildly dilated. Aneurysmal interatrial septum without 2D or color Doppler evidence of interatrial shunt. Mild tricuspid regurgitation. Peak RA-RV gradient 17 mmHg. The IVC is not well visualized. No significant change compared to previous study in 2018.   PCV MYOCARDIAL PERFUSION WITH LEXISCAN  Result Date: 05/02/2020 Lexiscan Tetrofosmin stress test 05/02/2020: Lexiscan nuclear stress test performed using 1-day protocol. SPECT images show large areas of breast  attenuation, with imaging performed in sitting position. There is small area of mild intensity decrease in tracer uptake, in basal inferolateral myocardium, more prominent on stress images. While attenuation artifact is likely, small area of ischemia in this region cannot be excluded.  Stress LVEF 76%. Low risk study.    Recent Labs: Lab Results  Component Value Date   WBC 7.7 04/18/2019   HGB 11.1 (L) 04/18/2019   PLT 220 04/18/2019   NA 137 04/17/2019   K 3.5 04/17/2019   CL 104 04/17/2019   CO2 23 04/17/2019   GLUCOSE 108 (H) 04/17/2019   BUN 19 04/17/2019   CREATININE 1.08 (H) 04/18/2019   BILITOT 0.8 04/17/2019   ALKPHOS 68 04/17/2019   AST 26 04/17/2019   ALT 29 04/17/2019   PROT 7.4 04/17/2019   ALBUMIN 4.0 04/17/2019   CALCIUM 9.3 04/17/2019   GFRAA 60 (L) 04/18/2019    Speciality Comments: No specialty comments available.  Procedures:  No procedures performed Allergies: Hydrocodone-acetaminophen   Assessment / Plan:     Visit Diagnoses: Primary osteoarthritis of left knee Positive ANA (antinuclear antibody)  During phone conversation we discussed no specific additional workup needed for underlying inflammatory disease. Knee effusion and hyperurecemia but noninflammatory fluid and negative for MSU. She is not a candidate for steroid injection at this time due to upcoming surgery in a few weeks. She will f/u with orthopedic surgery, can return PRN if new inflammatory joint pain suggestive for gout  attack.  Orders: No orders of the defined types were placed in this encounter.  No orders of the defined types were placed in this encounter.   Follow-Up Instructions: No follow-ups on file.   Collier Salina, MD  Note - This record has been created using [employer].  Chart creation errors have been sought, but may not always  have been located. Such creation errors do not reflect on  the standard of medical care.

## 2020-05-24 ENCOUNTER — Other Ambulatory Visit: Payer: Self-pay

## 2020-05-24 ENCOUNTER — Ambulatory Visit (INDEPENDENT_AMBULATORY_CARE_PROVIDER_SITE_OTHER): Payer: Medicare Other | Admitting: Internal Medicine

## 2020-05-24 ENCOUNTER — Encounter: Payer: Self-pay | Admitting: Internal Medicine

## 2020-05-24 VITALS — BP 118/62 | HR 71 | Ht 66.0 in | Wt 217.4 lb

## 2020-05-24 DIAGNOSIS — R768 Other specified abnormal immunological findings in serum: Secondary | ICD-10-CM

## 2020-05-24 DIAGNOSIS — M1712 Unilateral primary osteoarthritis, left knee: Secondary | ICD-10-CM

## 2020-05-31 ENCOUNTER — Other Ambulatory Visit: Payer: Self-pay

## 2020-06-01 NOTE — Progress Notes (Signed)
Your procedure is scheduled on Monday, November 22nd.  Report to [facility] Main Entrance "A" at 5:30 A.M., and check in at the Admitting office.  Call this number if you have problems the morning of surgery:  [phone]  Call [phone] if you have any questions prior to your surgery date Monday-Friday 8am-4pm   Remember:  Do not eat after midnight the night before your surgery  You may drink clear liquids until 4:15 A.M the morning of your surgery.   Clear liquids allowed are: Water, Non-Citrus Juices (without pulp), Carbonated Beverages, Clear Tea, Black Coffee Only, and Gatorade   Enhanced Recovery after Surgery for Orthopedics Enhanced Recovery after Surgery is a protocol used to improve the stress on your body and your recovery after surgery.  Patient Instructions  . The night before surgery:  o No food after midnight. ONLY clear liquids after midnight  .  Marland Kitchen The day of surgery (if you do NOT have diabetes):  o Drink ONE (1) Pre-Surgery Clear Ensure by 4:15 A.M. the morning of surgery. This drink was given to you during your hospital  pre-op appointment visit. o Nothing else to drink after completing the  Pre-Surgery Clear Ensure.         If you have questions, please contact your surgeon's office.   Take these medicines the morning of surgery with A SIP OF WATER  gabapentin (NEURONTIN) pravastatin (PRAVACHOL)   If needed: acetaminophen (TYLENOL), Albuterol Sulfate, sensor, (PROAIR DIGIHALER)/inhaler,  fluticasone (FLONASE)/nasal spray   Follow your surgeon's instructions on when to stop Aspirin.  If no instructions were given by your surgeon then you will need to call the office to get those instructions.    As of today, STOP taking any Aspirin (unless otherwise instructed by your surgeon) Aleve, Naproxen, Ibuprofen, Motrin, Advil, Goody's, BC's, all herbal medications, fish oil, and all vitamins.                     Do not wear jewelry, make up, or nail polish             Do not wear lotions, powders, perfumes, or deodorant.            Do not shave 48 hours prior to surgery.              Do not bring valuables to the hospital.            [facility] is not responsible for any belongings or valuables.  Do NOT Smoke (Tobacco/Vaping) or drink Alcohol 24 hours prior to your procedure If you use a CPAP at night, you may bring all equipment for your overnight stay.   Contacts, glasses, dentures or bridgework may not be worn into surgery.      For patients admitted to the hospital, discharge time will be determined by your treatment team.   Patients discharged the day of surgery will not be allowed to drive home, and someone needs to stay with them for 24 hours.  Special instructions:   [place]- Preparing For Surgery  Before surgery, you can play an important role. Because skin is not sterile, your skin needs to be as free of germs as possible. You can reduce the number of germs on your skin by washing with CHG (chlorahexidine gluconate) Soap before surgery.  CHG is an antiseptic cleaner which kills germs and bonds with the skin to continue killing germs even after washing.    Oral Hygiene is also important to  reduce your risk of infection.  Remember - BRUSH YOUR TEETH THE MORNING OF SURGERY WITH YOUR REGULAR TOOTHPASTE  Please do not use if you have an allergy to CHG or antibacterial soaps. If your skin becomes reddened/irritated stop using the CHG.  Do not shave (including legs and underarms) for at least 48 hours prior to first CHG shower. It is OK to shave your face.  Please follow these instructions carefully.   1. Shower the NIGHT BEFORE SURGERY and the MORNING OF SURGERY with CHG Soap.   2. If you chose to wash your hair, wash your hair first as usual with your normal shampoo.  3. After you shampoo, rinse your hair and body thoroughly to remove the shampoo.  4. Use CHG as you would any other liquid soap. You can apply CHG directly to the  skin and wash gently with a scrungie or a clean washcloth.   5. Apply the CHG Soap to your body ONLY FROM THE NECK DOWN.  Do not use on open wounds or open sores. Avoid contact with your eyes, ears, mouth and genitals (private parts). Wash Face and genitals (private parts)  with your normal soap.   6. Wash thoroughly, paying special attention to the area where your surgery will be performed.  7. Thoroughly rinse your body with warm water from the neck down.  8. DO NOT shower/wash with your normal soap after using and rinsing off the CHG Soap.  9. Pat yourself dry with a CLEAN TOWEL.  10. Wear CLEAN PAJAMAS to bed the night before surgery  11. Place CLEAN SHEETS on your bed the night of your first shower and DO NOT SLEEP WITH PETS.  Day of Surgery: Wear Clean/Comfortable clothing the morning of surgery Do not apply any deodorants/lotions.   Remember to brush your teeth WITH YOUR REGULAR TOOTHPASTE.   Please read over the following fact sheets that you were given.

## 2020-06-02 ENCOUNTER — Encounter (HOSPITAL_COMMUNITY): Payer: Self-pay

## 2020-06-02 ENCOUNTER — Ambulatory Visit (HOSPITAL_COMMUNITY)
Admission: RE | Admit: 2020-06-02 | Discharge: 2020-06-02 | Disposition: A | Discharge status: Home / Self Care | Payer: Medicare Other | Source: Ambulatory Visit | Attending: Physician Assistant | Admitting: Physician Assistant

## 2020-06-02 ENCOUNTER — Encounter (HOSPITAL_COMMUNITY)
Admission: RE | Admit: 2020-06-02 | Discharge: 2020-06-02 | Disposition: A | Discharge status: Home / Self Care | Payer: Medicare Other | Source: Ambulatory Visit | Attending: Orthopaedic Surgery | Admitting: Orthopaedic Surgery

## 2020-06-02 ENCOUNTER — Other Ambulatory Visit: Payer: Self-pay

## 2020-06-02 DIAGNOSIS — G4733 Obstructive sleep apnea (adult) (pediatric): Secondary | ICD-10-CM | POA: Insufficient documentation

## 2020-06-02 DIAGNOSIS — Z79899 Other long term (current) drug therapy: Secondary | ICD-10-CM | POA: Insufficient documentation

## 2020-06-02 DIAGNOSIS — E119 Type 2 diabetes mellitus without complications: Secondary | ICD-10-CM | POA: Insufficient documentation

## 2020-06-02 DIAGNOSIS — Z7982 Long term (current) use of aspirin: Secondary | ICD-10-CM | POA: Diagnosis not present

## 2020-06-02 DIAGNOSIS — Z01818 Encounter for other preprocedural examination: Secondary | ICD-10-CM | POA: Diagnosis not present

## 2020-06-02 DIAGNOSIS — J45909 Unspecified asthma, uncomplicated: Secondary | ICD-10-CM | POA: Insufficient documentation

## 2020-06-02 DIAGNOSIS — I10 Essential (primary) hypertension: Secondary | ICD-10-CM | POA: Diagnosis not present

## 2020-06-02 DIAGNOSIS — M1712 Unilateral primary osteoarthritis, left knee: Secondary | ICD-10-CM | POA: Insufficient documentation

## 2020-06-02 DIAGNOSIS — I358 Other nonrheumatic aortic valve disorders: Secondary | ICD-10-CM | POA: Insufficient documentation

## 2020-06-02 HISTORY — DX: Prediabetes: R73.03

## 2020-06-02 LAB — CBC WITH DIFFERENTIAL/PLATELET
Abs Immature Granulocytes: 0.01 10*3/uL (ref 0.00–0.07)
Basophils Absolute: 0 10*3/uL (ref 0.0–0.1)
Basophils Relative: 0 %
Eosinophils Absolute: 0.1 10*3/uL (ref 0.0–0.5)
Eosinophils Relative: 1 %
HCT: 35.8 % — ABNORMAL LOW (ref 36.0–46.0)
Hemoglobin: 11.2 g/dL — ABNORMAL LOW (ref 12.0–15.0)
Immature Granulocytes: 0 %
Lymphocytes Relative: 45 %
Lymphs Abs: 3.3 10*3/uL (ref 0.7–4.0)
MCH: 26.8 pg (ref 26.0–34.0)
MCHC: 31.3 g/dL (ref 30.0–36.0)
MCV: 85.6 fL (ref 80.0–100.0)
Monocytes Absolute: 0.6 10*3/uL (ref 0.1–1.0)
Monocytes Relative: 8 %
Neutro Abs: 3.4 10*3/uL (ref 1.7–7.7)
Neutrophils Relative %: 46 %
Platelets: 237 10*3/uL (ref 150–400)
RBC: 4.18 MIL/uL (ref 3.87–5.11)
RDW: 15 % (ref 11.5–15.5)
WBC: 7.4 10*3/uL (ref 4.0–10.5)
nRBC: 0 % (ref 0.0–0.2)

## 2020-06-02 LAB — COMPREHENSIVE METABOLIC PANEL
ALT: 26 U/L (ref 0–44)
AST: 23 U/L (ref 15–41)
Albumin: 3.9 g/dL (ref 3.5–5.0)
Alkaline Phosphatase: 85 U/L (ref 38–126)
Anion gap: 10 (ref 5–15)
BUN: 14 mg/dL (ref 8–23)
CO2: 26 mmol/L (ref 22–32)
Calcium: 9.1 mg/dL (ref 8.9–10.3)
Chloride: 102 mmol/L (ref 98–111)
Creatinine, Ser: 0.99 mg/dL (ref 0.44–1.00)
GFR, Estimated: >60 mL/min (ref >60)
Glucose, Bld: 156 mg/dL — ABNORMAL HIGH (ref 70–99)
Potassium: 3.3 mmol/L — ABNORMAL LOW (ref 3.5–5.1)
Sodium: 138 mmol/L (ref 135–145)
Total Bilirubin: 0.3 mg/dL (ref 0.3–1.2)
Total Protein: 7.2 g/dL (ref 6.5–8.1)

## 2020-06-02 LAB — URINALYSIS, ROUTINE W REFLEX MICROSCOPIC
Bilirubin Urine: NEGATIVE
Glucose, UA: NEGATIVE mg/dL
Hgb urine dipstick: NEGATIVE
Ketones, ur: NEGATIVE mg/dL
Nitrite: NEGATIVE
Protein, ur: NEGATIVE mg/dL
Specific Gravity, Urine: 1.019 (ref 1.005–1.030)
Squamous Epithelial / HPF: >50 — ABNORMAL HIGH (ref 0–5)
pH: 6 (ref 5.0–8.0)

## 2020-06-02 LAB — TYPE AND SCREEN
ABO/RH(D): B POS
Antibody Screen: NEGATIVE

## 2020-06-02 LAB — SURGICAL PCR SCREEN
MRSA, PCR: NEGATIVE
Staphylococcus aureus: NEGATIVE

## 2020-06-02 LAB — PROTIME-INR
INR: 1.1 (ref 0.8–1.2)
Prothrombin Time: 13.4 seconds (ref 11.4–15.2)

## 2020-06-02 LAB — APTT: aPTT: 27 seconds (ref 24–36)

## 2020-06-02 NOTE — Progress Notes (Signed)
Sherry at Dr. Erlinda Hong office notified of urinalysis result. Large amount of leukocytes and rare amount of bacteria.

## 2020-06-02 NOTE — Progress Notes (Signed)
PCP - Leeroy Cha Cardiologist - Rex Kras  PPM/ICD - denies Device Orders -  Rep Notified -   Chest x-ray - 06/02/2020 EKG - 04/25/2020 Stress Test - 05/02/2020 ECHO - 05/02/2020 Cardiac Cath -   Sleep Study - yes 09/18/2016 CPAP - wears every night, settings at 8    Patient instructed to hold all Aspirin, NSAID's, herbal medications, fish oil and vitamins 7 days prior to surgery.  ERAS Protcol -yes PRE-SURGERY Ensure or G2- ensure  COVID TEST- 06/03/2020 1450   Anesthesia review: yes, cardiac clearance is unknown. Medical clearance received via fax from PCP. Pt had an appt with Rex Kras (cardiology) on 04/25/2020 who requested an echocardiogram, repeat EKG and a stress test prior to surgery. Pt has completed those tests, but no clearance has been given.   Patient denies shortness of breath, fever, cough and chest pain at PAT appointment   All instructions explained to the patient, with a verbal understanding of the material. Patient agrees to go over the instructions while at home for a better understanding. Patient also instructed to self quarantine after being tested for COVID-19. The opportunity to ask questions was provided.

## 2020-06-03 ENCOUNTER — Other Ambulatory Visit: Payer: Self-pay | Admitting: Physician Assistant

## 2020-06-03 ENCOUNTER — Telehealth: Payer: Self-pay | Admitting: *Deleted

## 2020-06-03 ENCOUNTER — Other Ambulatory Visit (HOSPITAL_COMMUNITY)
Admission: RE | Admit: 2020-06-03 | Discharge: 2020-06-03 | Disposition: A | Discharge status: Home / Self Care | Payer: Medicare Other | Source: Ambulatory Visit | Attending: Orthopaedic Surgery | Admitting: Orthopaedic Surgery

## 2020-06-03 DIAGNOSIS — Z20822 Contact with and (suspected) exposure to covid-19: Secondary | ICD-10-CM | POA: Insufficient documentation

## 2020-06-03 DIAGNOSIS — Z01812 Encounter for preprocedural laboratory examination: Secondary | ICD-10-CM | POA: Insufficient documentation

## 2020-06-03 LAB — SARS CORONAVIRUS 2 (TAT 6-24 HRS): SARS Coronavirus 2: NEGATIVE

## 2020-06-03 MED ORDER — TRANEXAMIC ACID 1000 MG/10ML IV SOLN
2000.0000 mg | INTRAVENOUS | Status: AC
  Administered 2020-06-06: 2000 mg via TOPICAL
  Filled 2020-06-03: qty 20

## 2020-06-03 MED ORDER — ONDANSETRON HCL 4 MG PO TABS: 4.0000 mg | ORAL_TABLET | Freq: Three times a day (TID) | ORAL | 0 refills | Status: DC | PRN

## 2020-06-03 MED ORDER — DOCUSATE SODIUM 100 MG PO CAPS: 100.0000 mg | ORAL_CAPSULE | Freq: Every day | ORAL | 2 refills | Status: DC | PRN

## 2020-06-03 MED ORDER — ASPIRIN EC 81 MG PO TBEC: 81.0000 mg | DELAYED_RELEASE_TABLET | Freq: Two times a day (BID) | ORAL | 0 refills | Status: DC

## 2020-06-03 MED ORDER — CIPROFLOXACIN HCL 500 MG PO TABS: 500.0000 mg | ORAL_TABLET | Freq: Two times a day (BID) | ORAL | 0 refills | Status: AC

## 2020-06-03 MED ORDER — POTASSIUM CHLORIDE ER 10 MEQ PO TBCR: EXTENDED_RELEASE_TABLET | ORAL | 0 refills | Status: DC

## 2020-06-03 MED ORDER — BUPIVACAINE LIPOSOME 1.3 % IJ SUSP
20.0000 mL | Freq: Once | INTRAMUSCULAR | Status: DC
  Filled 2020-06-03: qty 20

## 2020-06-03 MED ORDER — OXYCODONE-ACETAMINOPHEN 5-325 MG PO TABS
1.0000 | ORAL_TABLET | Freq: Four times a day (QID) | ORAL | 0 refills | Status: DC | PRN
Start: 2020-06-03 — End: 2020-06-13

## 2020-06-03 MED ORDER — METHOCARBAMOL 500 MG PO TABS: 500.0000 mg | ORAL_TABLET | Freq: Two times a day (BID) | ORAL | 0 refills | Status: DC | PRN

## 2020-06-03 NOTE — Telephone Encounter (Signed)
Attempted Ortho bundle pre-op call to patient.  

## 2020-06-03 NOTE — Progress Notes (Signed)
Can you let patient know that she has a UTI and she also has low potassium.  I have called in meds for both that she needs to pick up and start asap

## 2020-06-03 NOTE — Anesthesia Preprocedure Evaluation (Addendum)
Anesthesia Evaluation  Patient identified by MRN, date of birth, ID band Patient awake    Reviewed: Allergy & Precautions, NPO status , Patient's Chart, lab work & pertinent test results  Airway Mallampati: II  TM Distance: >3 FB Neck ROM: Full    Dental no notable dental hx.    Pulmonary sleep apnea and Continuous Positive Airway Pressure Ventilation ,    Pulmonary exam normal breath sounds clear to auscultation       Cardiovascular hypertension, Normal cardiovascular exam Rhythm:Regular Rate:Normal     Neuro/Psych negative neurological ROS  negative psych ROS   GI/Hepatic negative GI ROS, Neg liver ROS,   Endo/Other  negative endocrine ROS  Renal/GU negative Renal ROS  negative genitourinary   Musculoskeletal negative musculoskeletal ROS (+)   Abdominal   Peds negative pediatric ROS (+)  Hematology negative hematology ROS (+)   Anesthesia Other Findings   Reproductive/Obstetrics negative OB ROS                            Anesthesia Physical Anesthesia Plan  ASA: III  Anesthesia Plan: Spinal   Post-op Pain Management:  Regional for Post-op pain   Induction: Intravenous  PONV Risk Score and Plan: 2 and Ondansetron and Dexamethasone  Airway Management Planned: Simple Face Mask  Additional Equipment:   Intra-op Plan:   Post-operative Plan: Extubation in OR  Informed Consent: I have reviewed the patients History and Physical, chart, labs and discussed the procedure including the risks, benefits and alternatives for the proposed anesthesia with the patient or authorized representative who has indicated his/her understanding and acceptance.     Dental advisory given  Plan Discussed with: CRNA and Surgeon  Anesthesia Plan Comments: (PAT note written 06/03/2020 by Myra Gianotti, PA-C. )       Anesthesia Quick Evaluation

## 2020-06-03 NOTE — Progress Notes (Addendum)
Anesthesia Chart Review:  Case: [id number] Date/Time: 06/06/20 0700   Procedure: LEFT TOTAL KNEE ARTHROPLASTY (Left Knee)   Anesthesia type: Spinal   Pre-op diagnosis: left knee degenerative joint disease   Location: [facility] OR ROOM 04 / [place] [facility]   Surgeons: Leandrew Koyanagi, MD      DISCUSSION: Patient is a 74 year old female scheduled for the above procedure.   History includes never smoker, HTN, OSA (CPAP), DM2 (A1c 7.1% 04/15/20), AV sclerosis (not noted on 01/17/17 echo), carotid bruits (mild plaque without stenosis 01/09/17 [place]), abnormal EKG (non-specific and/or anterior T wave inversion; low risk stress test 2018, 04/2020), asthma, right TKA (09/19/17). BMI is consistent with obesity.  Medical clearance by Leeroy Cha, MD. A1c 7.1% 04/15/20. Patient declined starting medications.  She had cardiology evaluation by Dr. Terri Skains on 04/25/20 and subsequently had an overall low risk stress test with stable echo (see CV).  Patient started on treatment for UTI and hypokalemia per ortho.  Preoperative COVID-19 test is scheduled for 06/03/20. Anesthesia team to evaluate on the day of surgery. 06/02/20 CXR report is still in process.   VS: BP 140/66   Pulse 90   Temp 37.1 C (Oral)   Resp 19   Ht 5\' 6"  (1.676 m)   Wt 98.8 kg   SpO2 100%   BMI 35.17 kg/m    PROVIDERS: Leeroy Cha, MD is PCP  - Rex Kras, DO is cardiologist [facility]). She has been seen there periodically (2018, 2020) for abnormal EKG and preoperative evaluations.  Vernelle Emerald, MD is rheumatologist. Evaluated 05/24/20 for positive ANA. He wrote, "She had joint aspiration showing noninflammatory synovial fluid and ENA serology was negative. Knee effusion was negative for MSU crystals so while she does have elevated uric acid I do not think gout was causing the current effusion and pain episode. Based on this assessment her knee pain and effusion is consistent with osteoarthritis and can proceed  for TKA without additional intervention." As needed follow-up. Prudy Feeler, MD is allergist/immunologist - Star Age, MD is neurologist ([facility])   LABS: Preoperative labs noted. See DISCUSSION. (all labs ordered are listed, but only abnormal results are displayed)  Labs Reviewed  CBC WITH DIFFERENTIAL/PLATELET - Abnormal; Notable for the following components:      Result Value   Hemoglobin 11.2 (*)    HCT 35.8 (*)    All other components within normal limits  COMPREHENSIVE METABOLIC PANEL - Abnormal; Notable for the following components:   Potassium 3.3 (*)    Glucose, Bld 156 (*)    All other components within normal limits  URINALYSIS, ROUTINE W REFLEX MICROSCOPIC - Abnormal; Notable for the following components:   APPearance CLOUDY (*)    Leukocytes,Ua LARGE (*)    Bacteria, UA RARE (*)    Squamous Epithelial / LPF >50 (*)    All other components within normal limits  SURGICAL PCR SCREEN  APTT  PROTIME-INR  TYPE AND SCREEN    Spirometry 01/07/20: "FEV1: 1.42L 72%, FVC: 2.47L 97%, ratio consistent with nonobstructive pattern for age"   IMAGES: CXR 06/02/20: In process.   EKG: 06/02/20: Normal sinus rhythm Nonspecific T wave abnormality Abnormal ECG Confirmed by Dixie Dials [phone]) on 06/02/2020 6:10:13 PM   CV: Lexiscan Tetrofosmin stress test 05/02/2020: Lexiscan nuclear stress test performed using 1-day protocol. SPECT images show large areas of breast attenuation, with imaging performed in sitting position. There is small area of mild intensity decrease in tracer uptake, in basal inferolateral  myocardium, more prominent on stress images. While attenuation artifact is likely, small area of ischemia in this region cannot be excluded.  Stress LVEF 76%. Low risk study.    Echocardiogram 04/28/2020:  Left ventricle cavity is normal in size. Moderate concentric hypertrophy  of the left ventricle. Normal global wall motion. Normal LV systolic  function with  EF 64%. Doppler evidence of grade I (impaired) diastolic  dysfunction, normal LAP.  Left atrial cavity is mildly dilated. Aneurysmal interatrial septum  without 2D or color Doppler evidence of interatrial shunt.  Mild tricuspid regurgitation. Peak RA-RV gradient 17 mmHg. The IVC is not  well visualized.  No significant change compared to previous study in 2018.    Event monitor 2 weeks placed 10/06/2018: No symptoms reported. Normal sinus rhythm. Minimum heart rate 54 bpm at 12:17 PM and maximum heart rate 97 bpm. no other arrhythmias. Monitor worn for 3 hours.   Carotid duplex 01/09/2017 [facility]; scanned under Media tab): 1.  There is mild soft plaque in bilateral carotid arteries without stenosis. 2.  Antegrade bilateral vertebral artery flow.   Past Medical History:  Diagnosis Date  . Abnormal stress ECG 10/06/2018  . Aortic valve sclerosis 10/06/2018  . Arthritis   . Asthma   . Bilateral carotid bruits 10/06/2018  . Headache    H/O bad HA, since using CPAP- she has had improvement with that issue   . Hypertension   . Laboratory examination 10/06/2018  . OSA on CPAP    settting - 8, uses CPAP q night   . Pre-diabetes   . Shingles     Past Surgical History:  Procedure Laterality Date  . CHOLECYSTECTOMY  1998  . TOTAL KNEE ARTHROPLASTY Right 09/19/2017   Procedure: RIGHT TOTAL KNEE ARTHROPLASTY;  Surgeon: Leandrew Koyanagi, MD;  Location: [place];  Service: Orthopedics;  Laterality: Right;  . TUBAL LIGATION      MEDICATIONS: . acetaminophen (TYLENOL) 500 MG tablet  . Albuterol Sulfate, sensor, (PROAIR DIGIHALER) 108 (90 Base) MCG/ACT AEPB  . aspirin EC 81 MG tablet  . azelastine (ASTELIN) 0.1 % nasal spray  . budesonide-formoterol (SYMBICORT) 80-4.5 MCG/ACT inhaler  . chlorthalidone (HYGROTON) 25 MG tablet  . cholecalciferol (VITAMIN D) 25 MCG (1000 UNIT) tablet  . ciprofloxacin (CIPRO) 500 MG tablet  . fluticasone (FLONASE) 50 MCG/ACT nasal spray  .  gabapentin (NEURONTIN) 300 MG capsule  . latanoprost (XALATAN) 0.005 % ophthalmic solution  . loratadine (CLARITIN) 10 MG tablet  . losartan (COZAAR) 100 MG tablet  . Polyethyl Glycol-Propyl Glycol (SYSTANE) 0.4-0.3 % SOLN  . potassium chloride (KLOR-CON) 10 MEQ tablet  . pravastatin (PRAVACHOL) 40 MG tablet  . Zinc 50 MG CAPS   No current facility-administered medications for this encounter.   Derrill Memo ON 06/06/2020] bupivacaine liposome (EXPAREL) 1.3 % injection 266 mg  . [START ON 06/06/2020] tranexamic acid (CYKLOKAPRON) 2,000 mg in sodium chloride 0.9 % 50 mL Topical Application   Myra Gianotti, PA-C Surgical Short Stay/Anesthesiology [facility] Phone [phone] [facility] Phone [phone] 06/03/2020 11:16 AM

## 2020-06-05 ENCOUNTER — Telehealth: Payer: Self-pay | Admitting: *Deleted

## 2020-06-05 NOTE — Care Plan (Signed)
RNCM call to patient to review her upcoming Left total knee replacement surgery with Dr. Erlinda Hong. Patient is an Ortho bundle through [facility] and is agreeable to case management. She has a FWW and 3in1. Will need home CPM. Will order through [place] to be delivered. She has family that will be assisting after surgery. Spoke with her and a daughter for post-op care instructions. Anticipate HHPT will be needed after short hospital stay. Referral made to [facility] after choice provided. She requested OPPT to be done at [facility]. Will assist with scheduling this appointment. Will continue to follow for needs.

## 2020-06-05 NOTE — Telephone Encounter (Signed)
Ortho bundle pre-op call completed. 

## 2020-06-06 ENCOUNTER — Other Ambulatory Visit: Payer: Self-pay

## 2020-06-06 ENCOUNTER — Observation Stay (HOSPITAL_COMMUNITY): Payer: Medicare Other

## 2020-06-06 ENCOUNTER — Ambulatory Visit (HOSPITAL_COMMUNITY): Payer: Medicare Other | Admitting: Vascular Surgery

## 2020-06-06 ENCOUNTER — Encounter (HOSPITAL_COMMUNITY)
Admission: RE | Disposition: A | Discharge status: Home / Self Care | Payer: Self-pay | Source: Home / Self Care | Attending: Orthopaedic Surgery

## 2020-06-06 ENCOUNTER — Inpatient Hospital Stay (HOSPITAL_COMMUNITY)
Admission: RE | Admit: 2020-06-06 | Discharge: 2020-06-08 | DRG: 470 | Disposition: A | Discharge status: Home / Self Care | Payer: Medicare Other | Attending: Orthopaedic Surgery | Admitting: Orthopaedic Surgery

## 2020-06-06 ENCOUNTER — Encounter (HOSPITAL_COMMUNITY): Payer: Self-pay | Admitting: Orthopaedic Surgery

## 2020-06-06 ENCOUNTER — Ambulatory Visit (HOSPITAL_COMMUNITY): Payer: Medicare Other | Admitting: Certified Registered Nurse Anesthetist

## 2020-06-06 DIAGNOSIS — M1712 Unilateral primary osteoarthritis, left knee: Secondary | ICD-10-CM | POA: Diagnosis not present

## 2020-06-06 DIAGNOSIS — Z9049 Acquired absence of other specified parts of digestive tract: Secondary | ICD-10-CM

## 2020-06-06 DIAGNOSIS — Z803 Family history of malignant neoplasm of breast: Secondary | ICD-10-CM | POA: Diagnosis not present

## 2020-06-06 DIAGNOSIS — G4733 Obstructive sleep apnea (adult) (pediatric): Secondary | ICD-10-CM | POA: Diagnosis not present

## 2020-06-06 DIAGNOSIS — J45909 Unspecified asthma, uncomplicated: Secondary | ICD-10-CM | POA: Diagnosis present

## 2020-06-06 DIAGNOSIS — Z96651 Presence of right artificial knee joint: Secondary | ICD-10-CM | POA: Diagnosis present

## 2020-06-06 DIAGNOSIS — G8918 Other acute postprocedural pain: Secondary | ICD-10-CM | POA: Diagnosis not present

## 2020-06-06 DIAGNOSIS — E669 Obesity, unspecified: Secondary | ICD-10-CM | POA: Diagnosis present

## 2020-06-06 DIAGNOSIS — Z8261 Family history of arthritis: Secondary | ICD-10-CM | POA: Diagnosis not present

## 2020-06-06 DIAGNOSIS — I10 Essential (primary) hypertension: Secondary | ICD-10-CM | POA: Diagnosis present

## 2020-06-06 DIAGNOSIS — Z96652 Presence of left artificial knee joint: Secondary | ICD-10-CM

## 2020-06-06 DIAGNOSIS — Z471 Aftercare following joint replacement surgery: Secondary | ICD-10-CM | POA: Diagnosis not present

## 2020-06-06 DIAGNOSIS — Z6835 Body mass index (BMI) 35.0-35.9, adult: Secondary | ICD-10-CM

## 2020-06-06 DIAGNOSIS — Z79899 Other long term (current) drug therapy: Secondary | ICD-10-CM

## 2020-06-06 DIAGNOSIS — Z7982 Long term (current) use of aspirin: Secondary | ICD-10-CM

## 2020-06-06 DIAGNOSIS — Z8249 Family history of ischemic heart disease and other diseases of the circulatory system: Secondary | ICD-10-CM | POA: Diagnosis not present

## 2020-06-06 DIAGNOSIS — Z885 Allergy status to narcotic agent status: Secondary | ICD-10-CM

## 2020-06-06 DIAGNOSIS — Z9989 Dependence on other enabling machines and devices: Secondary | ICD-10-CM | POA: Diagnosis not present

## 2020-06-06 HISTORY — PX: TOTAL KNEE ARTHROPLASTY: SHX125

## 2020-06-06 LAB — GLUCOSE, CAPILLARY: Glucose-Capillary: 137 mg/dL — ABNORMAL HIGH (ref 70–99)

## 2020-06-06 SURGERY — ARTHROPLASTY, KNEE, TOTAL
Anesthesia: Spinal | Site: Knee | Laterality: Left

## 2020-06-06 MED ORDER — OXYCODONE HCL 5 MG PO TABS
10.0000 mg | ORAL_TABLET | ORAL | Status: DC | PRN
  Administered 2020-06-06: 10 mg via ORAL
  Administered 2020-06-08: 15 mg via ORAL

## 2020-06-06 MED ORDER — PHENYLEPHRINE HCL-NACL 10-0.9 MG/250ML-% IV SOLN
INTRAVENOUS | Status: DC | PRN
  Administered 2020-06-06: 25 ug/min via INTRAVENOUS

## 2020-06-06 MED ORDER — LACTATED RINGERS IV SOLN: INTRAVENOUS | Status: DC

## 2020-06-06 MED ORDER — METOCLOPRAMIDE HCL 5 MG PO TABS
5.0000 mg | ORAL_TABLET | Freq: Three times a day (TID) | ORAL | Status: DC | PRN
  Administered 2020-06-07: 10 mg via ORAL
  Filled 2020-06-06: qty 2

## 2020-06-06 MED ORDER — OXYCODONE HCL ER 10 MG PO T12A
10.0000 mg | EXTENDED_RELEASE_TABLET | Freq: Two times a day (BID) | ORAL | Status: DC
  Administered 2020-06-06 – 2020-06-08 (×5): 10 mg via ORAL
  Filled 2020-06-06 (×5): qty 1

## 2020-06-06 MED ORDER — BUPIVACAINE LIPOSOME 1.3 % IJ SUSP
INTRAMUSCULAR | Status: DC | PRN
  Administered 2020-06-06: 20 mL

## 2020-06-06 MED ORDER — POLYETHYL GLYCOL-PROPYL GLYCOL 0.4-0.3 % OP SOLN: 1.0000 [drp] | OPHTHALMIC | Status: DC

## 2020-06-06 MED ORDER — CEFAZOLIN SODIUM-DEXTROSE 2-4 GM/100ML-% IV SOLN
2.0000 g | Freq: Four times a day (QID) | INTRAVENOUS | Status: AC
  Administered 2020-06-06 (×2): 2 g via INTRAVENOUS
  Filled 2020-06-06 (×2): qty 100

## 2020-06-06 MED ORDER — ONDANSETRON HCL 4 MG/2ML IJ SOLN: 4.0000 mg | Freq: Four times a day (QID) | INTRAMUSCULAR | Status: DC | PRN

## 2020-06-06 MED ORDER — BUPIVACAINE-EPINEPHRINE (PF) 0.25% -1:200000 IJ SOLN
INTRAMUSCULAR | Status: AC
  Filled 2020-06-06: qty 20

## 2020-06-06 MED ORDER — MAGNESIUM CITRATE PO SOLN
1.0000 | Freq: Once | ORAL | Status: AC | PRN
  Administered 2020-06-07: 1 via ORAL
  Filled 2020-06-06: qty 296

## 2020-06-06 MED ORDER — ONDANSETRON HCL 4 MG/2ML IJ SOLN: 4.0000 mg | Freq: Once | INTRAMUSCULAR | Status: DC | PRN

## 2020-06-06 MED ORDER — DIPHENHYDRAMINE HCL 12.5 MG/5ML PO ELIX
25.0000 mg | ORAL_SOLUTION | ORAL | Status: DC | PRN
  Administered 2020-06-07 – 2020-06-08 (×2): 25 mg via ORAL
  Filled 2020-06-06 (×3): qty 10

## 2020-06-06 MED ORDER — SORBITOL 70 % SOLN
30.0000 mL | Freq: Every day | Status: DC | PRN
  Filled 2020-06-06: qty 30

## 2020-06-06 MED ORDER — CHLORHEXIDINE GLUCONATE 0.12 % MT SOLN
OROMUCOSAL | Status: AC
  Administered 2020-06-06: 15 mL via OROMUCOSAL
  Filled 2020-06-06: qty 15

## 2020-06-06 MED ORDER — ORAL CARE MOUTH RINSE: 15.0000 mL | Freq: Once | OROMUCOSAL | Status: AC

## 2020-06-06 MED ORDER — DOCUSATE SODIUM 100 MG PO CAPS
100.0000 mg | ORAL_CAPSULE | Freq: Two times a day (BID) | ORAL | Status: DC
  Administered 2020-06-06 – 2020-06-08 (×4): 100 mg via ORAL
  Filled 2020-06-06 (×4): qty 1

## 2020-06-06 MED ORDER — VANCOMYCIN HCL 1000 MG IV SOLR
INTRAVENOUS | Status: AC
  Filled 2020-06-06: qty 1000

## 2020-06-06 MED ORDER — CEFAZOLIN SODIUM-DEXTROSE 2-4 GM/100ML-% IV SOLN
INTRAVENOUS | Status: AC
  Filled 2020-06-06: qty 100

## 2020-06-06 MED ORDER — HYDROMORPHONE HCL 1 MG/ML IJ SOLN: 0.5000 mg | INTRAMUSCULAR | Status: DC | PRN

## 2020-06-06 MED ORDER — CEFAZOLIN SODIUM-DEXTROSE 2-4 GM/100ML-% IV SOLN
2.0000 g | INTRAVENOUS | Status: AC
  Administered 2020-06-06: 2 g via INTRAVENOUS

## 2020-06-06 MED ORDER — ALUM & MAG HYDROXIDE-SIMETH 200-200-20 MG/5ML PO SUSP: 30.0000 mL | ORAL | Status: DC | PRN

## 2020-06-06 MED ORDER — SODIUM CHLORIDE 0.9% FLUSH
INTRAVENOUS | Status: DC | PRN
  Administered 2020-06-06: 40 mL via INTRAVENOUS
  Administered 2020-06-06: 20 mL via INTRAVENOUS

## 2020-06-06 MED ORDER — BUPIVACAINE IN DEXTROSE 0.75-8.25 % IT SOLN
INTRATHECAL | Status: DC | PRN
  Administered 2020-06-06: 1.8 mL via INTRATHECAL

## 2020-06-06 MED ORDER — POVIDONE-IODINE 10 % EX SWAB: 2.0000 "application " | Freq: Once | CUTANEOUS | Status: DC

## 2020-06-06 MED ORDER — CHLORTHALIDONE 25 MG PO TABS
25.0000 mg | ORAL_TABLET | Freq: Every day | ORAL | Status: DC
  Administered 2020-06-06 – 2020-06-08 (×3): 25 mg via ORAL
  Filled 2020-06-06 (×3): qty 1

## 2020-06-06 MED ORDER — PROPOFOL 500 MG/50ML IV EMUL
INTRAVENOUS | Status: DC | PRN
  Administered 2020-06-06: 75 ug/kg/min via INTRAVENOUS

## 2020-06-06 MED ORDER — OXYCODONE HCL 5 MG PO TABS
5.0000 mg | ORAL_TABLET | ORAL | Status: DC | PRN
  Administered 2020-06-06: 10 mg via ORAL
  Administered 2020-06-06 – 2020-06-07 (×2): 5 mg via ORAL
  Administered 2020-06-07 – 2020-06-08 (×5): 10 mg via ORAL
  Filled 2020-06-06 (×2): qty 1
  Filled 2020-06-06 (×6): qty 2
  Filled 2020-06-06: qty 1
  Filled 2020-06-06 (×2): qty 2

## 2020-06-06 MED ORDER — PRAVASTATIN SODIUM 40 MG PO TABS
40.0000 mg | ORAL_TABLET | Freq: Every day | ORAL | Status: DC
  Administered 2020-06-06 – 2020-06-08 (×3): 40 mg via ORAL
  Filled 2020-06-06 (×3): qty 1

## 2020-06-06 MED ORDER — GABAPENTIN 300 MG PO CAPS
300.0000 mg | ORAL_CAPSULE | Freq: Three times a day (TID) | ORAL | Status: DC
  Administered 2020-06-06 – 2020-06-08 (×6): 300 mg via ORAL
  Filled 2020-06-06 (×6): qty 1

## 2020-06-06 MED ORDER — LATANOPROST 0.005 % OP SOLN
1.0000 [drp] | Freq: Every day | OPHTHALMIC | Status: DC
  Administered 2020-06-06 – 2020-06-07 (×2): 1 [drp] via OPHTHALMIC
  Filled 2020-06-06: qty 2.5

## 2020-06-06 MED ORDER — FENTANYL CITRATE (PF) 100 MCG/2ML IJ SOLN
INTRAMUSCULAR | Status: DC | PRN
  Administered 2020-06-06: 50 ug via INTRAVENOUS

## 2020-06-06 MED ORDER — 0.9 % SODIUM CHLORIDE (POUR BTL) OPTIME
TOPICAL | Status: DC | PRN
  Administered 2020-06-06: 1000 mL

## 2020-06-06 MED ORDER — TRANEXAMIC ACID-NACL 1000-0.7 MG/100ML-% IV SOLN
INTRAVENOUS | Status: AC
  Filled 2020-06-06: qty 100

## 2020-06-06 MED ORDER — ACETAMINOPHEN 500 MG PO TABS
1000.0000 mg | ORAL_TABLET | Freq: Four times a day (QID) | ORAL | Status: AC
  Administered 2020-06-06 – 2020-06-07 (×4): 1000 mg via ORAL
  Filled 2020-06-06 (×4): qty 2

## 2020-06-06 MED ORDER — SODIUM CHLORIDE 0.9 % IR SOLN
Status: DC | PRN
  Administered 2020-06-06: 3000 mL

## 2020-06-06 MED ORDER — MIDAZOLAM HCL 5 MG/5ML IJ SOLN
INTRAMUSCULAR | Status: DC | PRN
  Administered 2020-06-06 (×2): 1 mg via INTRAVENOUS

## 2020-06-06 MED ORDER — TRANEXAMIC ACID-NACL 1000-0.7 MG/100ML-% IV SOLN
1000.0000 mg | INTRAVENOUS | Status: AC
  Administered 2020-06-06: 1000 mg via INTRAVENOUS

## 2020-06-06 MED ORDER — DEXAMETHASONE SODIUM PHOSPHATE 10 MG/ML IJ SOLN
10.0000 mg | Freq: Once | INTRAMUSCULAR | Status: AC
  Administered 2020-06-07: 10 mg via INTRAVENOUS
  Filled 2020-06-06: qty 1

## 2020-06-06 MED ORDER — SODIUM CHLORIDE 0.9 % IV SOLN: INTRAVENOUS | Status: DC

## 2020-06-06 MED ORDER — HYDROMORPHONE HCL 1 MG/ML IJ SOLN: 0.2500 mg | INTRAMUSCULAR | Status: DC | PRN

## 2020-06-06 MED ORDER — ONDANSETRON HCL 4 MG/2ML IJ SOLN
INTRAMUSCULAR | Status: AC
  Filled 2020-06-06: qty 2

## 2020-06-06 MED ORDER — MENTHOL 3 MG MT LOZG: 1.0000 | LOZENGE | OROMUCOSAL | Status: DC | PRN

## 2020-06-06 MED ORDER — BUPIVACAINE HCL (PF) 0.5 % IJ SOLN
INTRAMUSCULAR | Status: DC | PRN
  Administered 2020-06-06: 20 mL via PERINEURAL

## 2020-06-06 MED ORDER — PHENOL 1.4 % MT LIQD: 1.0000 | OROMUCOSAL | Status: DC | PRN

## 2020-06-06 MED ORDER — METHOCARBAMOL 500 MG PO TABS
500.0000 mg | ORAL_TABLET | Freq: Four times a day (QID) | ORAL | Status: DC | PRN
  Administered 2020-06-06 – 2020-06-08 (×6): 500 mg via ORAL
  Filled 2020-06-06 (×6): qty 1

## 2020-06-06 MED ORDER — METOCLOPRAMIDE HCL 5 MG/ML IJ SOLN: 5.0000 mg | Freq: Three times a day (TID) | INTRAMUSCULAR | Status: DC | PRN

## 2020-06-06 MED ORDER — LACTATED RINGERS IV SOLN: INTRAVENOUS | Status: DC | PRN

## 2020-06-06 MED ORDER — BUPIVACAINE-EPINEPHRINE 0.25% -1:200000 IJ SOLN
INTRAMUSCULAR | Status: DC | PRN
  Administered 2020-06-06: 20 mL

## 2020-06-06 MED ORDER — PROPOFOL 10 MG/ML IV BOLUS
INTRAVENOUS | Status: AC
  Filled 2020-06-06: qty 20

## 2020-06-06 MED ORDER — DEXAMETHASONE SODIUM PHOSPHATE 10 MG/ML IJ SOLN
INTRAMUSCULAR | Status: DC | PRN
  Administered 2020-06-06: 5 mg via INTRAVENOUS

## 2020-06-06 MED ORDER — MIDAZOLAM HCL 2 MG/2ML IJ SOLN
INTRAMUSCULAR | Status: AC
  Filled 2020-06-06: qty 2

## 2020-06-06 MED ORDER — CHLORHEXIDINE GLUCONATE 0.12 % MT SOLN: 15.0000 mL | Freq: Once | OROMUCOSAL | Status: AC

## 2020-06-06 MED ORDER — POLYETHYLENE GLYCOL 3350 17 G PO PACK: 17.0000 g | PACK | Freq: Every day | ORAL | Status: DC | PRN

## 2020-06-06 MED ORDER — ACETAMINOPHEN 325 MG PO TABS
325.0000 mg | ORAL_TABLET | Freq: Four times a day (QID) | ORAL | Status: DC | PRN
  Administered 2020-06-08 (×2): 650 mg via ORAL
  Filled 2020-06-06 (×2): qty 2

## 2020-06-06 MED ORDER — TRANEXAMIC ACID-NACL 1000-0.7 MG/100ML-% IV SOLN
1000.0000 mg | Freq: Once | INTRAVENOUS | Status: AC
  Administered 2020-06-06: 1000 mg via INTRAVENOUS
  Filled 2020-06-06: qty 100

## 2020-06-06 MED ORDER — FENTANYL CITRATE (PF) 250 MCG/5ML IJ SOLN
INTRAMUSCULAR | Status: AC
  Filled 2020-06-06: qty 5

## 2020-06-06 MED ORDER — LOSARTAN POTASSIUM 50 MG PO TABS
100.0000 mg | ORAL_TABLET | Freq: Every day | ORAL | Status: DC
  Administered 2020-06-06 – 2020-06-08 (×3): 100 mg via ORAL
  Filled 2020-06-06 (×3): qty 2

## 2020-06-06 MED ORDER — METHOCARBAMOL 1000 MG/10ML IJ SOLN
500.0000 mg | Freq: Four times a day (QID) | INTRAVENOUS | Status: DC | PRN
  Filled 2020-06-06: qty 5

## 2020-06-06 MED ORDER — VANCOMYCIN HCL 1000 MG IV SOLR
INTRAVENOUS | Status: DC | PRN
  Administered 2020-06-06: 1000 mg via TOPICAL

## 2020-06-06 MED ORDER — ASPIRIN 81 MG PO CHEW
81.0000 mg | CHEWABLE_TABLET | Freq: Two times a day (BID) | ORAL | Status: DC
  Administered 2020-06-06 – 2020-06-08 (×4): 81 mg via ORAL
  Filled 2020-06-06 (×4): qty 1

## 2020-06-06 MED ORDER — ONDANSETRON HCL 4 MG PO TABS: 4.0000 mg | ORAL_TABLET | Freq: Four times a day (QID) | ORAL | Status: DC | PRN

## 2020-06-06 MED ORDER — ONDANSETRON HCL 4 MG/2ML IJ SOLN
INTRAMUSCULAR | Status: DC | PRN
  Administered 2020-06-06: 4 mg via INTRAVENOUS

## 2020-06-06 MED ORDER — CIPROFLOXACIN HCL 500 MG PO TABS
500.0000 mg | ORAL_TABLET | Freq: Two times a day (BID) | ORAL | Status: DC
  Administered 2020-06-06 – 2020-06-08 (×5): 500 mg via ORAL
  Filled 2020-06-06 (×6): qty 1

## 2020-06-06 MED ORDER — KETOROLAC TROMETHAMINE 15 MG/ML IJ SOLN
15.0000 mg | Freq: Four times a day (QID) | INTRAMUSCULAR | Status: AC
  Administered 2020-06-06 – 2020-06-07 (×4): 15 mg via INTRAVENOUS
  Filled 2020-06-06 (×4): qty 1

## 2020-06-06 MED ORDER — DEXAMETHASONE SODIUM PHOSPHATE 10 MG/ML IJ SOLN
INTRAMUSCULAR | Status: AC
  Filled 2020-06-06: qty 1

## 2020-06-06 SURGICAL SUPPLY — 78 items
ALCOHOL 70% 16 OZ (MISCELLANEOUS) ×2 IMPLANT
BAG DECANTER FOR FLEXI CONT (MISCELLANEOUS) ×2 IMPLANT
BANDAGE ESMARK 6X9 LF (GAUZE/BANDAGES/DRESSINGS) ×1 IMPLANT
BASEPLATE TIBIAL LT SZ 5 (Knees) ×1 IMPLANT
BLADE SAG 18X100X1.27 (BLADE) ×2 IMPLANT
BNDG ESMARK 6X9 LF (GAUZE/BANDAGES/DRESSINGS) ×2
BOWL SMART MIX CTS (DISPOSABLE) ×2 IMPLANT
CEMENT BONE REFOBACIN R1X40 US (Cement) ×4 IMPLANT
CLSR STERI-STRIP ANTIMIC 1/2X4 (GAUZE/BANDAGES/DRESSINGS) ×2 IMPLANT
COMP FEM POST 6N LT (Joint) ×2 IMPLANT
COMPONENT FEM POST 6N LT (Joint) ×1 IMPLANT
COOLER ICEMAN CLASSIC (MISCELLANEOUS) ×2 IMPLANT
COVER SURGICAL LIGHT HANDLE (MISCELLANEOUS) ×2 IMPLANT
COVER WAND RF STERILE (DRAPES) IMPLANT
CUFF TOURN SGL QUICK 34 (TOURNIQUET CUFF) ×1
CUFF TOURN SGL QUICK 42 (TOURNIQUET CUFF) ×2 IMPLANT
CUFF TRNQT CYL 34X4.125X (TOURNIQUET CUFF) ×1 IMPLANT
DERMABOND ADVANCED (GAUZE/BANDAGES/DRESSINGS) ×1
DERMABOND ADVANCED .7 DNX12 (GAUZE/BANDAGES/DRESSINGS) ×1 IMPLANT
DRAPE EXTREMITY T 121X128X90 (DISPOSABLE) ×2 IMPLANT
DRAPE HALF SHEET 40X57 (DRAPES) ×2 IMPLANT
DRAPE INCISE IOBAN 66X45 STRL (DRAPES) ×2 IMPLANT
DRAPE ORTHO SPLIT 77X108 STRL (DRAPES) ×2
DRAPE POUCH INSTRU U-SHP 10X18 (DRAPES) ×2 IMPLANT
DRAPE SURG ORHT 6 SPLT 77X108 (DRAPES) ×2 IMPLANT
DRAPE U-SHAPE 47X51 STRL (DRAPES) ×4 IMPLANT
DRSG AQUACEL AG ADV 3.5X10 (GAUZE/BANDAGES/DRESSINGS) ×2 IMPLANT
DRSG AQUACEL AG ADV 3.5X14 (GAUZE/BANDAGES/DRESSINGS) ×2 IMPLANT
DURAPREP 26ML APPLICATOR (WOUND CARE) ×6 IMPLANT
ELECT CAUTERY BLADE 6.4 (BLADE) ×2 IMPLANT
ELECT REM PT RETURN 9FT ADLT (ELECTROSURGICAL) ×2
ELECTRODE REM PT RTRN 9FT ADLT (ELECTROSURGICAL) ×1 IMPLANT
GLOVE BIOGEL PI IND STRL 7.0 (GLOVE) ×1 IMPLANT
GLOVE BIOGEL PI INDICATOR 7.0 (GLOVE) ×1
GLOVE ECLIPSE 7.0 STRL STRAW (GLOVE) ×6 IMPLANT
GLOVE SKINSENSE NS SZ7.5 (GLOVE) ×3
GLOVE SKINSENSE STRL SZ7.5 (GLOVE) ×3 IMPLANT
GLOVE SURG SYN 7.5  E (GLOVE) ×4
GLOVE SURG SYN 7.5 E (GLOVE) ×4 IMPLANT
GOWN STRL REIN XL XLG (GOWN DISPOSABLE) ×2 IMPLANT
GOWN STRL REUS W/ TWL LRG LVL3 (GOWN DISPOSABLE) ×2 IMPLANT
GOWN STRL REUS W/TWL LRG LVL3 (GOWN DISPOSABLE) ×2
HANDPIECE INTERPULSE COAX TIP (DISPOSABLE) ×1
HOOD PEEL AWAY FLYTE STAYCOOL (MISCELLANEOUS) ×4 IMPLANT
INSERT SPEED PIN RIMMED 45MM (PIN) ×6 IMPLANT
INSERT SZ 5-6 KNEE (Knees) ×2 IMPLANT
JET LAVAGE IRRISEPT WOUND (IRRIGATION / IRRIGATOR) ×2
KIT BASIN OR (CUSTOM PROCEDURE TRAY) ×2 IMPLANT
KIT TURNOVER KIT B (KITS) ×2 IMPLANT
LAVAGE JET IRRISEPT WOUND (IRRIGATION / IRRIGATOR) ×1 IMPLANT
MANIFOLD NEPTUNE II (INSTRUMENTS) ×2 IMPLANT
MARKER SKIN DUAL TIP RULER LAB (MISCELLANEOUS) ×4 IMPLANT
NEEDLE SPNL 18GX3.5 QUINCKE PK (NEEDLE) ×2 IMPLANT
NS IRRIG 1000ML POUR BTL (IV SOLUTION) ×2 IMPLANT
PACK TOTAL JOINT (CUSTOM PROCEDURE TRAY) ×2 IMPLANT
PAD ARMBOARD 7.5X6 YLW CONV (MISCELLANEOUS) ×4 IMPLANT
PAD COLD SHLDR WRAP-ON (PAD) ×2 IMPLANT
PATELLA RESURF GEN II 32MM (Orthopedic Implant) ×2 IMPLANT
PIN TROCAR 3 INCH (PIN) ×16 IMPLANT
SAW OSC TIP CART 19.5X105X1.3 (SAW) ×2 IMPLANT
SET HNDPC FAN SPRY TIP SCT (DISPOSABLE) ×1 IMPLANT
STAPLER VISISTAT 35W (STAPLE) IMPLANT
SUCTION FRAZIER HANDLE 10FR (MISCELLANEOUS) ×1
SUCTION TUBE FRAZIER 10FR DISP (MISCELLANEOUS) ×1 IMPLANT
SUT ETHILON 2 0 FS 18 (SUTURE) ×8 IMPLANT
SUT MNCRL AB 4-0 PS2 18 (SUTURE) IMPLANT
SUT VIC AB 0 CT1 27 (SUTURE) ×2
SUT VIC AB 0 CT1 27XBRD ANBCTR (SUTURE) ×2 IMPLANT
SUT VIC AB 1 CTX 27 (SUTURE) ×6 IMPLANT
SUT VIC AB 2-0 CT1 27 (SUTURE) ×4
SUT VIC AB 2-0 CT1 TAPERPNT 27 (SUTURE) ×4 IMPLANT
SYR 50ML LL SCALE MARK (SYRINGE) ×4 IMPLANT
TIBIAL BASEPLATE SZ 5 (Knees) ×2 IMPLANT
TOWEL GREEN STERILE (TOWEL DISPOSABLE) ×2 IMPLANT
TOWEL GREEN STERILE FF (TOWEL DISPOSABLE) ×2 IMPLANT
TRAY CATH 16FR W/PLASTIC CATH (SET/KITS/TRAYS/PACK) IMPLANT
UNDERPAD 30X36 HEAVY ABSORB (UNDERPADS AND DIAPERS) ×2 IMPLANT
WRAP KNEE MAXI GEL POST OP (GAUZE/BANDAGES/DRESSINGS) IMPLANT

## 2020-06-06 NOTE — H&P (Signed)
PREOPERATIVE H&P  Chief Complaint: left knee degenerative joint disease  HPI: Rachel Crane is a 74 y.o. female who presents for surgical treatment of left knee degenerative joint disease.  She denies any changes in medical history.  Past Medical History:  Diagnosis Date  . Abnormal stress ECG 10/06/2018  . Aortic valve sclerosis 10/06/2018  . Arthritis   . Asthma   . Bilateral carotid bruits 10/06/2018  . Headache    H/O bad HA, since using CPAP- she has had improvement with that issue   . Hypertension   . Laboratory examination 10/06/2018  . OSA on CPAP    settting - 8, uses CPAP q night   . Pre-diabetes   . Shingles    Past Surgical History:  Procedure Laterality Date  . CHOLECYSTECTOMY  1998  . TOTAL KNEE ARTHROPLASTY Right 09/19/2017   Procedure: RIGHT TOTAL KNEE ARTHROPLASTY;  Surgeon: Leandrew Koyanagi, MD;  Location: [facility];  Service: Orthopedics;  Laterality: Right;  . TUBAL LIGATION     Social History   Socioeconomic History  . Marital status: Widowed    Spouse name: Not on file  . Number of children: 3  . Years of education: Not on file  . Highest education level: Not on file  Occupational History  . Not on file  Tobacco Use  . Smoking status: Never Smoker  . Smokeless tobacco: Never Used  Vaping Use  . Vaping Use: Never used  Substance and Sexual Activity  . Alcohol use: Not Currently  . Drug use: No  . Sexual activity: Not on file  Other Topics Concern  . Not on file  Social History Narrative  . Not on file   Social Determinants of Health   Financial Resource Strain:   . Difficulty of Paying Living Expenses: Not on file  Food Insecurity:   . Worried About Charity fundraiser in the Last Year: Not on file  . Ran Out of Food in the Last Year: Not on file  Transportation Needs:   . Lack of Transportation (Medical): Not on file  . Lack of Transportation (Non-Medical): Not on file  Physical Activity:   . Days of Exercise per Week: Not on file  .  Minutes of Exercise per Session: Not on file  Stress:   . Feeling of Stress : Not on file  Social Connections:   . Frequency of Communication with Friends and Family: Not on file  . Frequency of Social Gatherings with Friends and Family: Not on file  . Attends Religious Services: Not on file  . Active Member of Clubs or Organizations: Not on file  . Attends Archivist Meetings: Not on file  . Marital Status: Not on file   Family History  Problem Relation Age of Onset  . Breast cancer Sister   . Hypertension Mother   . Arthritis Mother    Allergies  Allergen Reactions  . Hydrocodone-Acetaminophen     Hives and nausea   Prior to Admission medications   Medication Sig Start Date End Date Taking? Authorizing Provider  acetaminophen (TYLENOL) 500 MG tablet Take 1,000 mg by mouth every 8 (eight) hours as needed for mild pain or moderate pain (Knee pain).    Yes [provider]  Albuterol Sulfate, sensor, (PROAIR DIGIHALER) 108 (90 Base) MCG/ACT AEPB Inhale 1-2 puffs into the lungs every 4 (four) hours as needed. Patient taking differently: Inhale 1-2 puffs into the lungs every 4 (four) hours as needed (  shortness of breath).  01/07/20  Yes Kennith Gain, MD  aspirin EC 81 MG tablet Take 1 tablet (81 mg total) by mouth daily. 05/12/18  Yes Clent Demark, PA-C  chlorthalidone (HYGROTON) 25 MG tablet Take 25 mg by mouth daily.  11/03/19  Yes [provider]  cholecalciferol (VITAMIN D) 25 MCG (1000 UNIT) tablet Take 1,000 Units by mouth daily.   Yes [provider]  ciprofloxacin (CIPRO) 500 MG tablet Take 1 tablet (500 mg total) by mouth 2 (two) times daily for 10 days. 06/03/20 06/13/20 Yes Aundra Dubin, PA-C  fluticasone (FLONASE) 50 MCG/ACT nasal spray Place 2 sprays into both nostrils daily. Patient taking differently: Place 2 sprays into both nostrils daily as needed for rhinitis.  01/07/20  Yes Padgett, Rae Halsted, MD    gabapentin (NEURONTIN) 300 MG capsule Take 1 capsule (300 mg total) by mouth 3 (three) times daily. 02/01/20  Yes Magnus Sinning, MD  latanoprost (XALATAN) 0.005 % ophthalmic solution Place 1 drop into both eyes at bedtime.  12/05/17  Yes [provider]  losartan (COZAAR) 100 MG tablet Take 1 tablet (100 mg total) by mouth daily. 12/04/18  Yes [name], NP  Polyethyl Glycol-Propyl Glycol (SYSTANE) 0.4-0.3 % SOLN Place 1 drop into both eyes every morning.   Yes [provider]  potassium chloride (KLOR-CON) 10 MEQ tablet Take 4 pills twice daily x 4 days 06/03/20  Yes Aundra Dubin, PA-C  pravastatin (PRAVACHOL) 40 MG tablet Take 40 mg by mouth daily.  03/31/19  Yes [provider]  Zinc 50 MG CAPS Take 50 mg by mouth daily.    Yes [provider]  aspirin EC 81 MG tablet Take 1 tablet (81 mg total) by mouth 2 (two) times daily. To be taken twice daily x 6 weeks after surgery to prevent blood clots 06/03/20   Aundra Dubin, PA-C  azelastine (ASTELIN) 0.1 % nasal spray Place 2 sprays into both nostrils 2 (two) times daily. Patient not taking: Reported on 06/01/2020 01/07/20   Kennith Gain, MD  budesonide-formoterol [facility]) 80-4.5 MCG/ACT inhaler 2 Puffs 2 times daily Patient not taking: Reported on 06/01/2020 01/07/20   Kennith Gain, MD  docusate sodium (COLACE) 100 MG capsule Take 1 capsule (100 mg total) by mouth daily as needed. 06/03/20 06/03/21  Aundra Dubin, PA-C  loratadine (CLARITIN) 10 MG tablet Take 1 tablet (10 mg total) by mouth daily. Patient not taking: Reported on 06/01/2020 01/07/20   Kennith Gain, MD  methocarbamol (ROBAXIN) 500 MG tablet Take 1 tablet (500 mg total) by mouth 2 (two) times daily as needed. 06/03/20   Aundra Dubin, PA-C  ondansetron (ZOFRAN) 4 MG tablet Take 1 tablet (4 mg total) by mouth every 8 (eight) hours as needed for nausea or vomiting. 06/03/20   Aundra Dubin, PA-C  oxyCODONE-acetaminophen (PERCOCET) 5-325 MG tablet Take 1-2 tablets by mouth every 6 (six) hours as needed. 06/03/20   Aundra Dubin, PA-C     Positive ROS: All other systems have been reviewed and were otherwise negative with the exception of those mentioned in the HPI and as above.  Physical Exam: General: Alert, no acute distress Cardiovascular: No pedal edema Respiratory: No cyanosis, no use of accessory musculature GI: abdomen soft Skin: No lesions in the area of chief complaint Neurologic: Sensation intact distally Psychiatric: Patient is competent for consent with normal mood and affect Lymphatic: no lymphedema  MUSCULOSKELETAL: exam stable  Assessment: left knee degenerative joint disease  Plan: Plan for Procedure(s): LEFT TOTAL KNEE ARTHROPLASTY  The risks benefits and alternatives were discussed with the patient including but not limited to the risks of nonoperative treatment, versus surgical intervention including infection, bleeding, nerve injury,  blood clots, cardiopulmonary complications, morbidity, mortality, among others, and they were willing to proceed.   Preoperative templating of the joint replacement has been completed, documented, and submitted to the Operating Room personnel in order to optimize intra-operative equipment management.   Eduard Roux, MD 06/06/2020 6:13 AM

## 2020-06-06 NOTE — Anesthesia Procedure Notes (Signed)
Spinal  Patient location during procedure: OR Start time: 06/06/2020 7:15 AM End time: 06/06/2020 7:20 AM Staffing Anesthesiologist: Myrtie Soman, MD Preanesthetic Checklist Completed: patient identified, IV checked, site marked, risks and benefits discussed, surgical consent, monitors and equipment checked, pre-op evaluation and timeout performed Spinal Block Patient position: sitting Prep: DuraPrep Patient monitoring: heart rate, cardiac monitor, continuous pulse ox and blood pressure Approach: midline Location: L3-4 Injection technique: single-shot Needle Needle type: Sprotte  Needle gauge: 24 G Needle length: 9 cm Assessment Sensory level: T6

## 2020-06-06 NOTE — Anesthesia Procedure Notes (Signed)
Procedure Name: MAC Date/Time: 06/06/2020 7:23 AM Performed by: Candis Shine, CRNA Pre-anesthesia Checklist: Patient identified, Emergency Drugs available, Suction available, Patient being monitored and Timeout performed Patient Re-evaluated:Patient Re-evaluated prior to induction Oxygen Delivery Method: Simple face mask Dental Injury: Teeth and Oropharynx as per pre-operative assessment

## 2020-06-06 NOTE — Evaluation (Signed)
Physical Therapy Evaluation Patient Details Name: Rachel Crane MRN: [id number] DOB: 1946/05/23 Today's Date: 06/06/2020   History of Present Illness  Pt is a 74 y/o female s/p L TKA. PMH includes R TKA, OSA on CPAP, HTN, asthma, and pre-diabetes.   Clinical Impression  Pt is s/p surgery above with deficits below. Pt requiring min A to stand and transfer to chair using RW. Pt with increased pain, so further mobility limited. Educated about knee precautions. Will continue to follow acutely.     Follow Up Recommendations Follow surgeon's recommendation for DC plan and follow-up therapies;Supervision for mobility/OOB    Equipment Recommendations  None recommended by PT    Recommendations for Other Services OT consult     Precautions / Restrictions Precautions Precautions: Fall;Knee Precaution Booklet Issued: No Precaution Comments: Verbally reviewed knee precautions  Restrictions Weight Bearing Restrictions: Yes LLE Weight Bearing: Weight bearing as tolerated      Mobility  Bed Mobility Overal bed mobility: Needs Assistance Bed Mobility: Supine to Sit     Supine to sit: Min assist     General bed mobility comments: Min A for LLE assist.     Transfers Overall transfer level: Needs assistance Equipment used: Rolling walker (2 wheeled) Transfers: Sit to/from [facility] Sit to Stand: Min assist Stand pivot transfers: Min assist       General transfer comment: Min A for lift assist and steadying to stand. Cues for safe hand placement and correct use of RW. Min A for steadying assist to transfer to chair. Further mobility limited secondary to pain.   Ambulation/Gait                Stairs            Wheelchair Mobility    Modified Rankin (Stroke Patients Only)       Balance Overall balance assessment: Needs assistance Sitting-balance support: No upper extremity supported;Feet supported Sitting balance-Leahy Scale: Fair      Standing balance support: Bilateral upper extremity supported;During functional activity Standing balance-Leahy Scale: Poor Standing balance comment: Reliant on BUE support                              Pertinent Vitals/Pain Pain Assessment: Faces Faces Pain Scale: Hurts whole lot Pain Location: L knee  Pain Descriptors / Indicators: Aching;Moaning;Operative site guarding;Grimacing Pain Intervention(s): Limited activity within patient's tolerance;Monitored during session;Repositioned    Home Living Family/patient expects to be discharged to:: Private residence Living Arrangements: Alone Available Help at Discharge: Family Type of Home: House Home Access: Stairs to enter Entrance Stairs-Rails: None Entrance Stairs-Number of Steps: 1 Home Layout: One level Home Equipment: Environmental consultant - 2 wheels;Bedside commode      Prior Function Level of Independence: Independent with assistive device(s)         Comments: Uses RW for mobility      Hand Dominance        Extremity/Trunk Assessment   Upper Extremity Assessment Upper Extremity Assessment: Defer to OT evaluation    Lower Extremity Assessment Lower Extremity Assessment: LLE deficits/detail LLE Deficits / Details: Deficits consistent with post op pain and weakness.     Cervical / Trunk Assessment Cervical / Trunk Assessment: Normal  Communication   Communication: No difficulties  Cognition Arousal/Alertness: Awake/alert Behavior During Therapy: WFL for tasks assessed/performed Overall Cognitive Status: Within Functional Limits for tasks assessed  General Comments      Exercises     Assessment/Plan    PT Assessment Patient needs continued PT services  PT Problem List Decreased strength;Decreased range of motion;Decreased activity tolerance;Decreased balance;Decreased mobility;Decreased knowledge of use of DME;Decreased knowledge of  precautions;Pain       PT Treatment Interventions DME instruction;Gait training;Functional mobility training;Stair training;Therapeutic activities;Therapeutic exercise;Balance training;Patient/family education    PT Goals (Current goals can be found in the Care Plan section)  Acute Rehab PT Goals Patient Stated Goal: to decrease pain  PT Goal Formulation: With patient Time For Goal Achievement: 06/20/20 Potential to Achieve Goals: Good    Frequency 7X/week   Barriers to discharge        Co-evaluation               AM-PAC PT "6 Clicks" Mobility  Outcome Measure Help needed turning from your back to your side while in a flat bed without using bedrails?: A Little Help needed moving from lying on your back to sitting on the side of a flat bed without using bedrails?: A Little Help needed moving to and from a bed to a chair (including a wheelchair)?: A Little Help needed standing up from a chair using your arms (e.g., wheelchair or bedside chair)?: A Little Help needed to walk in hospital room?: A Lot Help needed climbing 3-5 steps with a railing? : A Lot 6 Click Score: 16    End of Session Equipment Utilized During Treatment: Gait belt Activity Tolerance: Patient limited by pain Patient left: in chair;with call bell/phone within reach Nurse Communication: Mobility status PT Visit Diagnosis: Other abnormalities of gait and mobility (R26.89);Pain Pain - Right/Left: Left Pain - part of body: Knee    Time: 4580-9983 PT Time Calculation (min) (ACUTE ONLY): 22 min   Charges:   PT Evaluation $PT Eval Low Complexity: 1 Low          Lou Miner, DPT  Acute Rehabilitation Services  Pager: [phone] Office: [phone]   Rudean Hitt 06/06/2020, 3:02 PM

## 2020-06-06 NOTE — Anesthesia Procedure Notes (Signed)
Anesthesia Procedure Image    

## 2020-06-06 NOTE — Progress Notes (Signed)
Orthopedic Tech Progress Note Patient Details:  Rachel Crane Nov 01, 1945 [id number] Applied an [place] with [facility] for patient Patient ID: Rachel Crane, female   DOB: July 04, 1946, 74 y.o.   MRN: [id number]   Janit Pagan 06/06/2020, 11:58 AM

## 2020-06-06 NOTE — Progress Notes (Signed)
Orthopedic Tech Progress Note Patient Details:  Rachel Crane May 22, 1946 [id number]  CPM Left Knee CPM Left Knee: On Left Knee Flexion (Degrees): 90 Left Knee Extension (Degrees): 0  Post Interventions Patient Tolerated: Well Instructions Provided: Care of device, Poper ambulation with device  [name] P [name] 06/06/2020, 10:36 AM

## 2020-06-06 NOTE — Op Note (Signed)
Total Knee Arthroplasty Procedure Note  Preoperative diagnosis: Left knee osteoarthritis  Postoperative diagnosis:same  Operative procedure: Left total knee arthroplasty. CPT [phone]  Surgeon: N. Eduard Roux, MD  Assist: Madalyn Rob, PA-C; necessary for the timely completion of procedure and due to complexity of procedure.  Anesthesia: Spinal, regional, local  Tourniquet time: see anesthesia record  Implants used: Smith and Nephew  Femur: Legion PS 6 Tibia: Genesis II 5 Patella: 32 mm Polyethylene: 11 mm  Indication: Rachel Crane is a 74 y.o. year old female with a history of knee pain. Having failed conservative management, the patient elected to proceed with a total knee arthroplasty.  We have reviewed the risk and benefits of the surgery and they elected to proceed after voicing understanding.  Procedure:  After informed consent was obtained and understanding of the risk were voiced including but not limited to bleeding, infection, damage to surrounding structures including nerves and vessels, blood clots, leg length inequality and the failure to achieve desired results, the operative extremity was marked with verbal confirmation of the patient in the holding area.   The patient was then brought to the operating room and transported to the operating room table in the supine position.  A tourniquet was applied to the operative extremity around the upper thigh. The operative limb was then prepped and draped in the usual sterile fashion and preoperative antibiotics were administered.  A time out was performed prior to the start of surgery confirming the correct extremity, preoperative antibiotic administration, as well as team members, implants and instruments available for the case. Correct surgical site was also confirmed with preoperative radiographs. The limb was then elevated for exsanguination and the tourniquet was inflated. A midline incision was made and a standard  medial parapatellar approach was performed.  The infrapatellar fat pad was removed.  Suprapatellar synovium was removed to reveal the anterior distal femoral cortex.  A medial peel was performed to release the capsule of the medial tibial plateau.  The patella was then everted and was prepared and sized to a 32 mm.  A cover was placed on the patella for protection from retractors.  The knee was then brought into full flexion and we then turned our attention to the femur.  The cruciates were sacrificed.  Start site was drilled in the femur and the intramedullary distal femoral cutting guide was placed, set at 5 degrees valgus, taking 9 mm of distal resection. The distal cut was made. Osteophytes were then removed.  Next, the proximal tibial cutting guide was placed with appropriate slope, varus/valgus alignment and depth of resection. The proximal tibial cut was made. Gap blocks were then used to assess the extension gap and alignment, and appropriate soft tissue releases were performed. Attention was turned back to the femur, which was sized using the sizing guide to a size 6. Appropriate rotation of the femoral component was determined using epicondylar axis, Whiteside's line, and assessing the flexion gap under ligament tension. The appropriate size 4-in-1 cutting block was placed and checked with an angel wing and cuts were made. Posterior femoral osteophytes and uncapped bone were then removed with the curved osteotome.  The PS box cut was then made.  Trial components were placed, and stability was checked in full extension, mid-flexion, and deep flexion. Proper tibial rotation was determined and marked.  The patella tracked well without a lateral release.  The femoral lugs were then drilled. Trial components were then removed and tibial preparation performed.  The  tibia was sized for a size 5 component.  A posterior capsular injection comprising of 20 cc of 1.3% exparel, 20 cc of 0.25% bupivicaine with epi  and 20 cc of normal saline was performed for postoperative pain control. The bony surfaces were irrigated with a pulse lavage and then dried. Bone cement was vacuum mixed on the back table, and the final components sized above were cemented into place.  Antibiotic irrigation was placed in the knee joint and soft tissues while the cement cured.  After cement had finished curing, excess cement was removed. The stability of the construct was re-evaluated throughout a range of motion and found to be acceptable. The trial liner was removed, the knee was copiously irrigated, and the knee was re-evaluated for any excess bone debris. The real polyethylene liner, 11 mm thick, was inserted and checked to ensure the locking mechanism had engaged appropriately. The tourniquet was deflated and hemostasis was achieved. The wound was irrigated with normal saline.  One gram of vancomycin powder was placed in the surgical bed.  Capsular closure was performed with a #1 vicryl, subcutaneous fat closed with a 0 vicryl suture, then subcutaneous tissue closed with interrupted 2.0 vicryl suture. The skin was then closed with a 2.0 nylon and dermabond. A sterile dressing was applied.  The patient was awakened in the operating room and taken to recovery in stable condition. All sponge, needle, and instrument counts were correct at the end of the case.  Position: supine  Complications: none.  Time Out: performed   Drains/Packing: none  Estimated blood loss: minimal  Returned to Recovery Room: in good condition.   Antibiotics: yes   Mechanical VTE (DVT) Prophylaxis: sequential compression devices, TED thigh-high  Chemical VTE (DVT) Prophylaxis: aspirin  Fluid Replacement  Crystalloid: see anesthesia record Blood: none  FFP: none   Specimens Removed: 1 to pathology   Sponge and Instrument Count Correct? yes   PACU: portable radiograph - knee AP and Lateral   Plan/RTC: Return in 2 weeks for wound check.   Weight  Bearing/Load Lower Extremity: full   N. Eduard Roux, MD [facility] 8:55 AM

## 2020-06-06 NOTE — Anesthesia Postprocedure Evaluation (Signed)
Anesthesia Post Note  Patient: Rachel Crane  Procedure(s) Performed: LEFT TOTAL KNEE ARTHROPLASTY (Left Knee)     Patient location during evaluation: PACU Anesthesia Type: Spinal Level of consciousness: oriented and awake and alert Pain management: pain level controlled Vital Signs Assessment: post-procedure vital signs reviewed and stable Respiratory status: spontaneous breathing, respiratory function stable and patient connected to nasal cannula oxygen Cardiovascular status: blood pressure returned to baseline and stable Postop Assessment: no headache, no backache and no apparent nausea or vomiting Anesthetic complications: no   No complications documented.  Last Vitals:  Vitals:   06/06/20 1037 06/06/20 1052  BP: 118/74 116/69  Pulse: 63 (!) 56  Resp: 17 20  Temp: 36.7 C 36.6 C  SpO2: 99% 100%    Last Pain:  Vitals:   06/06/20 1052  TempSrc: Oral  PainSc:                  Osceola Holian S

## 2020-06-06 NOTE — Anesthesia Procedure Notes (Signed)
Anesthesia Regional Block: Adductor canal block   Pre-Anesthetic Checklist: ,, timeout performed, Correct Patient, Correct Site, Correct Laterality, Correct Procedure, Correct Position, site marked, Risks and benefits discussed,  Surgical consent,  Pre-op evaluation,  At surgeon's request and post-op pain management  Laterality: Left  Prep: chloraprep       Needles:  Injection technique: Single-shot  Needle Type: Echogenic Needle     Needle Length: 9cm      Additional Needles:   Procedures:,,,, ultrasound used (permanent image in chart),,,,  Narrative:  Start time: 06/06/2020 6:53 AM End time: 06/06/2020 6:58 AM Injection made incrementally with aspirations every 5 mL.  Performed by: Personally  Anesthesiologist: Myrtie Soman, MD  Additional Notes: Patient tolerated the procedure well without complications

## 2020-06-06 NOTE — Transfer of Care (Signed)
Immediate Anesthesia Transfer of Care Note  Patient: Rachel Crane  Procedure(s) Performed: LEFT TOTAL KNEE ARTHROPLASTY (Left Knee)  Patient Location: PACU  Anesthesia Type:Spinal and MAC combined with regional for post-op pain  Level of Consciousness: awake, alert  and oriented  Airway & Oxygen Therapy: Patient Spontanous Breathing  Post-op Assessment: Report given to RN and Post -op Vital signs reviewed and stable  Post vital signs: Reviewed and stable  Last Vitals:  Vitals Value Taken Time  BP 121/65 06/06/20 0937  Temp    Pulse 72 06/06/20 0937  Resp 15 06/06/20 0937  SpO2 99 % 06/06/20 0937  Vitals shown include unvalidated device data.  Last Pain:  Vitals:   06/06/20 0615  TempSrc:   PainSc: 0-No pain      Patients Stated Pain Goal: 3 (94/07/68 0881)  Complications: No complications documented.

## 2020-06-06 NOTE — Care Plan (Signed)
Ortho Bundle Case Management Note  Patient Details  Name: Rachel Crane MRN: [id number] Date of Birth: 1945/12/23    RNCM call to patient to review her upcoming Left total knee replacement surgery with Dr. Erlinda Hong. Patient is an Ortho bundle through [facility] and is agreeable to case management. She has a FWW and 3in1. Will need home CPM. Will order through [facility] to be delivered. She has family that will be assisting after surgery. Spoke with her and a daughter for post-op care instructions. Anticipate HHPT will be needed after short hospital stay. Referral made to [facility] after choice provided. She requested OPPT to be done at [facility]. Will assist with scheduling this appointment. Will continue to follow for needs.                       DME Arranged:  CPM (Patient reports having FWW and BSC/3in1 at home already) DME Agency:  [employer]  HH Arranged:  PT [place] Agency:  [facility] (now [facility])  Additional Comments: Please contact me with any questions of if this plan should need to change.  Jamse Arn, RN, BSN, [employer]  [phone] 06/06/2020, 11:20 AM

## 2020-06-07 LAB — CBC
HCT: 29.5 % — ABNORMAL LOW (ref 36.0–46.0)
Hemoglobin: 9.5 g/dL — ABNORMAL LOW (ref 12.0–15.0)
MCH: 27.4 pg (ref 26.0–34.0)
MCHC: 32.2 g/dL (ref 30.0–36.0)
MCV: 85 fL (ref 80.0–100.0)
Platelets: 214 10*3/uL (ref 150–400)
RBC: 3.47 MIL/uL — ABNORMAL LOW (ref 3.87–5.11)
RDW: 14.8 % (ref 11.5–15.5)
WBC: 9.5 10*3/uL (ref 4.0–10.5)
nRBC: 0 % (ref 0.0–0.2)

## 2020-06-07 LAB — BASIC METABOLIC PANEL
Anion gap: 9 (ref 5–15)
BUN: 25 mg/dL — ABNORMAL HIGH (ref 8–23)
CO2: 27 mmol/L (ref 22–32)
Calcium: 8.9 mg/dL (ref 8.9–10.3)
Chloride: 101 mmol/L (ref 98–111)
Creatinine, Ser: 1.29 mg/dL — ABNORMAL HIGH (ref 0.44–1.00)
GFR, Estimated: 44 mL/min — ABNORMAL LOW (ref >60)
Glucose, Bld: 131 mg/dL — ABNORMAL HIGH (ref 70–99)
Potassium: 3.6 mmol/L (ref 3.5–5.1)
Sodium: 137 mmol/L (ref 135–145)

## 2020-06-07 NOTE — Discharge Instructions (Signed)

## 2020-06-07 NOTE — Evaluation (Signed)
Occupational Therapy Evaluation Patient Details Name: Rachel Crane MRN: [id number] DOB: Jun 16, 1946 Today's Date: 06/07/2020    History of Present Illness Pt is a 74 y.o. female s/p L TKA on 06/06/20. PMH includes R TKA, OSA on CPAP, HTN, asthma, pre-diabetes.   Clinical Impression   Patient is s/p L TKA surgery resulting in functional limitations due to the deficits listed below (see OT problem list). Pt limited by internal distraction of continued hospitalization this session. Pt limited to chair level adl education. Pt reports having broken 3n1 and does not want to have to pay for new one. Pt states "my daughter will fix it if I have to buy new one" pt noted to have plastic under the ted hose L LE and removed this session. Pt with ice applied and was able to accurately verbalize pain management with medications this session.  Patient will benefit from skilled OT acutely to increase independence and safety with ADLS to allow discharge [place].     Follow Up Recommendations  Home health OT    Equipment Recommendations  Other (comment) (reports 3n1 is broken but does not want to pay for new one)    Recommendations for Other Services       Precautions / Restrictions Precautions Precautions: Fall;Knee Precaution Comments: Verbally reviewed knee precautions  Restrictions Weight Bearing Restrictions: No LLE Weight Bearing: Weight bearing as tolerated      Mobility Bed Mobility Overal bed mobility: Needs Assistance Bed Mobility: Supine to Sit     Supine to sit: HOB elevated;Min assist     General bed mobility comments: oob on arrival. declined back to bed or practice    Transfers Overall transfer level: Needs assistance Equipment used: Rolling walker (2 wheeled) Transfers: Sit to/from Stand Sit to Stand: Min guard         General transfer comment: Multiple sit<>stands from EOB and recliner to RW, reliant on momentum to power into standing; min guard for balance; poor  eccentric control secondary to knee pain    Balance Overall balance assessment: Needs assistance Sitting-balance support: No upper extremity supported;Feet supported Sitting balance-Leahy Scale: Fair       Standing balance-Leahy Scale: Fair Standing balance comment: Can static stand without UE support but unable to accept challenge                           ADL either performed or assessed with clinical judgement   ADL Overall ADL's : Needs assistance/impaired Eating/Feeding: Independent   Grooming: Modified independent;Sitting   Upper Body Bathing: Supervision/ safety;Sitting   Lower Body Bathing: Moderate assistance;Sit to/from stand       Lower Body Dressing: Moderate assistance;Sit to/from stand Lower Body Dressing Details (indicate cue type and reason): unable to figure 4 cross               General ADL Comments: pt in chair on arrival and very fixated on staying another night. Pt perseverating on the need to remain acutely one more night. Pt able to don brace for ice machine with min (A) help. Pt reports previous R TKA required SNF placement.   Educated on clean linen every shower and washing around the dressing. Pt educated that bandage is water proof dressing and at void water under the bandage.      Vision Baseline Vision/History: No visual deficits       Perception     Praxis      Pertinent Vitals/Pain Pain Assessment:  0-10 Pain Score: 7  Faces Pain Scale: Hurts even more Pain Location: L knee  Pain Descriptors / Indicators: Aching;Moaning;Operative site guarding;Grimacing Pain Intervention(s): Monitored during session;Premedicated before session;Repositioned;Ice applied     Hand Dominance Right   Extremity/Trunk Assessment Upper Extremity Assessment Upper Extremity Assessment: Overall WFL for tasks assessed   Lower Extremity Assessment Lower Extremity Assessment: Defer to PT evaluation   Cervical / Trunk Assessment Cervical /  Trunk Assessment: Normal   Communication Communication Communication: No difficulties   Cognition Arousal/Alertness: Awake/alert Behavior During Therapy: WFL for tasks assessed/performed Overall Cognitive Status: Within Functional Limits for tasks assessed                                     General Comments  dressing intact with two sites of dried blood noted. pt educated on washing around dressing     Exercises General Exercises - Lower Extremity Ankle Circles/Pumps: AROM;Both;Seated Other Exercises Other Exercises: gravity-assisted knee flexion sitting edge of recliner   Shoulder Instructions      Home Living Family/patient expects to be discharged to:: Private residence Living Arrangements: Alone Available Help at Discharge: Family Type of Home: House Home Access: Stairs to enter Technical brewer of Steps: 1 Entrance Stairs-Rails: None Home Layout: One level         Biochemist, clinical: Standard     Home Equipment: Environmental consultant - 2 wheels;Bedside commode   Additional Comments: plan to sponge bath/ has a toy poodle named COCO and neighbor in addition to daughter to (A) upon d/c. pt reports neighbor will even help with bathing      Prior Functioning/Environment Level of Independence: Independent with assistive device(s)        Comments: Uses RW for mobility         OT Problem List: Decreased activity tolerance;Impaired balance (sitting and/or standing);Decreased safety awareness;Decreased knowledge of use of DME or AE;Decreased knowledge of precautions;Obesity;Pain      OT Treatment/Interventions: Self-care/ADL training;Therapeutic exercise;Energy conservation;DME and/or AE instruction;Manual therapy;Modalities;Therapeutic activities;Patient/family education;Balance training    OT Goals(Current goals can be found in the care plan section) Acute Rehab OT Goals Patient Stated Goal: to decrease pain  OT Goal Formulation: With patient Time For Goal  Achievement: 06/21/20 Potential to Achieve Goals: Good  OT Frequency: Min 2X/week   Barriers to D/C:            Co-evaluation              AM-PAC OT "6 Clicks" Daily Activity     Outcome Measure Help from another person eating meals?: None Help from another person taking care of personal grooming?: None Help from another person toileting, which includes using toliet, bedpan, or urinal?: A Little Help from another person bathing (including washing, rinsing, drying)?: A Little Help from another person to put on and taking off regular upper body clothing?: None Help from another person to put on and taking off regular lower body clothing?: A Little 6 Click Score: 21   End of Session CPM Left Knee CPM Left Knee: Off Additional Comments: seated in recliner Nurse Communication: Mobility status;Precautions  Activity Tolerance: Other (comment) (pain noted and very distracted) Patient left: in chair;with call bell/phone within reach  OT Visit Diagnosis: Unsteadiness on feet (R26.81);Muscle weakness (generalized) (M62.81);Pain Pain - Right/Left: Left Pain - part of body: Knee                Time:  7949-9718 OT Time Calculation (min): 18 min Charges:  OT General Charges $OT Visit: 1 Visit OT Evaluation $OT Eval Moderate Complexity: 1 Mod   Rachel Crane, OTR/L  Acute Rehabilitation Services Pager: [phone] Office: [phone] .   Jeri Modena 06/07/2020, 10:55 AM

## 2020-06-07 NOTE — Care Plan (Signed)
RNCM in patient's room to check status today. She states she really would like to stay another night to work with therapy in order to get home tomorrow. Updated PA for Dr. Erlinda Hong. Also, she states that she does have the RW and BSC/3in1 that she informed CM about before surgery, but RW has a wheel that is not stable and the rubber is worn out on the other legs. She requested if [facility] would cover these items. Explained that they will not if they have purchased within the last 5 years and they have. She would have to pay out of pocket to get new DME. She stated that she would make her family aware and would borrow a walker from a friend if needed. Also, CPM to be delivered to her hospital room today by [employer] rep.

## 2020-06-07 NOTE — Discharge Summary (Addendum)
Patient ID: Rachel Crane MRN: [id number] DOB/AGE: [birth year] 74 y.o.  Admit date: 06/06/2020 Discharge date: 06/08/2020  Admission Diagnoses:  Principal Problem:   Primary osteoarthritis of left knee Active Problems:   Status post total left knee replacement   Discharge Diagnoses:  Same  Past Medical History:  Diagnosis Date  . Abnormal stress ECG 10/06/2018  . Aortic valve sclerosis 10/06/2018  . Arthritis   . Asthma   . Bilateral carotid bruits 10/06/2018  . Headache    H/O bad HA, since using CPAP- she has had improvement with that issue   . Hypertension   . Laboratory examination 10/06/2018  . OSA on CPAP    settting - 8, uses CPAP q night   . Pre-diabetes   . Shingles     Surgeries: Procedure(s): LEFT TOTAL KNEE ARTHROPLASTY on 06/06/2020   Consultants:   Discharged Condition: Improved  Hospital Course: Rachel Crane is an 74 y.o. female who was admitted 06/06/2020 for operative treatment ofPrimary osteoarthritis of left knee. Patient has severe unremitting pain that affects sleep, daily activities, and work/hobbies. After pre-op clearance the patient was taken to the operating room on 06/06/2020 and underwent  Procedure(s): LEFT TOTAL KNEE ARTHROPLASTY.    Patient was given perioperative antibiotics:  Anti-infectives (From admission, onward)   Start     Dose/Rate Route Frequency Ordered Stop   06/06/20 1300  ceFAZolin (ANCEF) IVPB 2g/100 mL premix        2 g 200 mL/hr over 30 Minutes Intravenous Every 6 hours 06/06/20 1107 06/06/20 1920   06/06/20 1200  ciprofloxacin (CIPRO) tablet 500 mg        500 mg Oral 2 times daily 06/06/20 1107     06/06/20 0759  vancomycin (VANCOCIN) powder  Status:  Discontinued          As needed 06/06/20 0800 06/06/20 0932   06/06/20 0600  ceFAZolin (ANCEF) IVPB 2g/100 mL premix        2 g 200 mL/hr over 30 Minutes Intravenous On call to O.R. 06/06/20 0546 06/06/20 0725   06/06/20 0554  ceFAZolin (ANCEF) 2-4 GM/100ML-% IVPB        Note to Pharmacy: Nyoka Cowden   : cabinet override      06/06/20 0554 06/06/20 0736       Patient was given sequential compression devices, early ambulation, and chemoprophylaxis to prevent DVT.  Patient benefited maximally from hospital stay and there were no complications.    Recent vital signs:  Patient Vitals for the past 24 hrs:  BP Temp Temp src Pulse Resp SpO2  06/08/20 0747 (!) 151/74 99 F (37.2 C) Oral 63 17 98 %  06/08/20 0550 (!) 168/71 98.2 F (36.8 C) Oral 68 18 100 %  06/08/20 0035 (!) 184/72 98.3 F (36.8 C) Axillary 78 18 99 %  06/07/20 2041 (!) 150/77 98.3 F (36.8 C) Oral 66 18 97 %  06/07/20 1602 (!) 154/76 98.3 F (36.8 C) Oral 65 16 96 %  06/07/20 1224 (!) 153/64 98.5 F (36.9 C) Oral 74 16 96 %     Recent laboratory studies:  Recent Labs    06/07/20 0531  WBC 9.5  HGB 9.5*  HCT 29.5*  PLT 214  NA 137  K 3.6  CL 101  CO2 27  BUN 25*  CREATININE 1.29*  GLUCOSE 131*  CALCIUM 8.9     Discharge Medications:   Allergies as of 06/08/2020      Reactions   Hydrocodone-acetaminophen  Hives and nausea      Medication List    STOP taking these medications   acetaminophen 500 MG tablet Commonly known as: TYLENOL   potassium chloride 10 MEQ tablet Commonly known as: KLOR-CON     TAKE these medications   aspirin EC 81 MG tablet Take 1 tablet (81 mg total) by mouth 2 (two) times daily. To be taken twice daily x 6 weeks after surgery to prevent blood clots What changed: Another medication with the same name was removed. Continue taking this medication, and follow the directions you see here.   azelastine 0.1 % nasal spray Commonly known as: ASTELIN Place 2 sprays into both nostrils 2 (two) times daily.   budesonide-formoterol 80-4.5 MCG/ACT inhaler Commonly known as: SYMBICORT 2 Puffs 2 times daily   chlorthalidone 25 MG tablet Commonly known as: HYGROTON Take 25 mg by mouth daily.   cholecalciferol 25 MCG (1000 UNIT)  tablet Commonly known as: VITAMIN D Take 1,000 Units by mouth daily.   ciprofloxacin 500 MG tablet Commonly known as: Cipro Take 1 tablet (500 mg total) by mouth 2 (two) times daily for 10 days.   docusate sodium 100 MG capsule Commonly known as: Colace Take 1 capsule (100 mg total) by mouth daily as needed.   fluticasone 50 MCG/ACT nasal spray Commonly known as: Flonase Place 2 sprays into both nostrils daily. What changed:   when to take this  reasons to take this   gabapentin 300 MG capsule Commonly known as: NEURONTIN Take 1 capsule (300 mg total) by mouth 3 (three) times daily.   latanoprost 0.005 % ophthalmic solution Commonly known as: XALATAN Place 1 drop into both eyes at bedtime.   loratadine 10 MG tablet Commonly known as: CLARITIN Take 1 tablet (10 mg total) by mouth daily.   losartan 100 MG tablet Commonly known as: COZAAR Take 1 tablet (100 mg total) by mouth daily.   methocarbamol 500 MG tablet Commonly known as: Robaxin Take 1 tablet (500 mg total) by mouth 2 (two) times daily as needed.   ondansetron 4 MG tablet Commonly known as: Zofran Take 1 tablet (4 mg total) by mouth every 8 (eight) hours as needed for nausea or vomiting.   oxyCODONE-acetaminophen 5-325 MG tablet Commonly known as: Percocet Take 1-2 tablets by mouth every 6 (six) hours as needed.   pravastatin 40 MG tablet Commonly known as: PRAVACHOL Take 40 mg by mouth daily.   ProAir Digihaler 108 (90 Base) MCG/ACT Aepb Generic drug: Albuterol Sulfate (sensor) Inhale 1-2 puffs into the lungs every 4 (four) hours as needed. What changed: reasons to take this   Systane 0.4-0.3 % Soln Generic drug: Polyethyl Glycol-Propyl Glycol Place 1 drop into both eyes every morning.   Zinc 50 MG Caps Take 50 mg by mouth daily.            Durable Medical Equipment  (From admission, onward)         Start     Ordered   06/06/20 1108  DME Walker rolling  Once       Question:  Patient  needs a walker to treat with the following condition  Answer:  Total knee replacement status   06/06/20 1107   06/06/20 1108  DME 3 n 1  Once        06/06/20 1107   06/06/20 1108  DME Bedside commode  Once       Question:  Patient needs a bedside commode to treat with the following  condition  Answer:  Total knee replacement status   06/06/20 1107          Diagnostic Studies: DG Chest 2 View  Result Date: 06/03/2020 CLINICAL DATA:  74 year old female with left knee osteoarthritis. EXAM: CHEST - 2 VIEW COMPARISON:  Chest radiographs 04/18/2019 and earlier. FINDINGS: Lung volumes and mediastinal contours are stable and within normal limits. Asymmetric right 1st rib costochondral calcification again noted. Visualized tracheal air column is within normal limits. Both lungs appear stable and clear. No pneumothorax or pleural effusion. No acute osseous abnormality identified. Stable upper abdominal surgical clips on the lateral. Negative visible bowel gas pattern. IMPRESSION: No acute cardiopulmonary abnormality. Electronically Signed   By: Genevie Ann M.D.   On: 06/03/2020 16:19   DG Knee Left Port  Result Date: 06/06/2020 CLINICAL DATA:  Status post left total knee arthroplasty. EXAM: PORTABLE LEFT KNEE - 1-2 VIEW COMPARISON:  March 01, 2020. FINDINGS: The left femoral and tibial components appear to be well situated. Expected postoperative changes are noted in the soft tissues anteriorly. IMPRESSION: Status post left total knee arthroplasty. Electronically Signed   By: Marijo Conception M.D.   On: 06/06/2020 10:13    Disposition: Discharge disposition: 01-Home or Self Care          Follow-up Information    Leandrew Koyanagi, MD. Go on 06/21/2020.   Specialty: Orthopedic Surgery Why: at 10:15 am for your 2 week in office appointment with Dr. Sherilyn Cooter information: [place] [address] [phone]        [facility] Follow up.   Specialty: [place] Why: Someone from the home health agency will be in contact with you after your discharge home to arrange your firs in home physical therapy appointment Contact information: [address] [place] [address] [phone]        [facility] Follow up.   Specialty: Rehabilitation Why: this appointment is pending; your nurse case manager will assist in scheduling Contact information: [address] [address] [phone]               Signed: Aundra Dubin 06/08/2020, 8:09 AM

## 2020-06-07 NOTE — Progress Notes (Signed)
Physical Therapy Treatment Patient Details Name: Rachel Crane MRN: [id number] DOB: [birth year] Today's Date: 06/07/2020    History of Present Illness Pt is a 74 y.o. female s/p L TKA on 06/06/20. PMH includes R TKA, OSA on CPAP, HTN, asthma, pre-diabetes.   PT Comments    Pt progressing with mobility, although still limited by c/o significant L knee pain. Session focused on transfer and gait training with RW. Will plan for stair training during second session. Reviewed educ re: precautions, postioning, activity recommendations and importance of mobility/ROM. Discussed pt will likely be moving well enough to d/c home after second session today, but pt wanting to stay another night. Will continue to follow acutely.    Follow Up Recommendations  Home health PT;Supervision for mobility/OOB;Follow surgeon's recommendation for DC plan and follow-up therapies     Equipment Recommendations  None recommended by PT    Recommendations for Other Services       Precautions / Restrictions Precautions Precautions: Fall;Knee Precaution Comments: Verbally reviewed knee precautions  Restrictions Weight Bearing Restrictions: Yes LLE Weight Bearing: Weight bearing as tolerated    Mobility  Bed Mobility Overal bed mobility: Needs Assistance Bed Mobility: Supine to Sit     Supine to sit: HOB elevated;Min assist     General bed mobility comments: Increased time and effort, minA for HHA to elevate trunk; required encouragement to perform as independently as possible  Transfers Overall transfer level: Needs assistance Equipment used: Rolling walker (2 wheeled) Transfers: Sit to/from Stand Sit to Stand: Min guard         General transfer comment: Multiple sit<>stands from EOB and recliner to RW, reliant on momentum to power into standing; min guard for balance; poor eccentric control secondary to knee pain  Ambulation/Gait Ambulation/Gait assistance: Min guard Gait Distance (Feet): 160  Feet Assistive device: Rolling walker (2 wheeled) Gait Pattern/deviations: Step-through pattern;Decreased stride length;Decreased dorsiflexion - left;Decreased weight shift to left Gait velocity: Decreased   General Gait Details: Slow, antalgic gait with RW and intermittent min guard for balance   Stairs             Wheelchair Mobility    Modified Rankin (Stroke Patients Only)       Balance Overall balance assessment: Needs assistance Sitting-balance support: No upper extremity supported;Feet supported Sitting balance-Leahy Scale: Fair       Standing balance-Leahy Scale: Fair Standing balance comment: Can static stand without UE support but unable to accept challenge                            Cognition Arousal/Alertness: Awake/alert Behavior During Therapy: WFL for tasks assessed/performed Overall Cognitive Status: Within Functional Limits for tasks assessed                                        Exercises General Exercises - Lower Extremity Ankle Circles/Pumps: AROM;Both;Seated Other Exercises Other Exercises: gravity-assisted knee flexion sitting edge of recliner    General Comments General comments (skin integrity, edema, etc.): Reviewed importance of knee precautions (i.e. resting in extension) and ROM. Discussed pt likely moving well enough to d/c home after second session, but pt wanting to stay another night      Pertinent Vitals/Pain Pain Assessment: Faces Faces Pain Scale: Hurts even more Pain Location: L knee  Pain Descriptors / Indicators: Aching;Moaning;Operative site guarding;Grimacing Pain Intervention(s): Limited activity  within patient's tolerance;Repositioned    Home Living                      Prior Function            PT Goals (current goals can now be found in the care plan section) Progress towards PT goals: Progressing toward goals    Frequency    7X/week      PT Plan Current plan  remains appropriate    Co-evaluation              AM-PAC PT "6 Clicks" Mobility   Outcome Measure  Help needed turning from your back to your side while in a flat bed without using bedrails?: A Little Help needed moving from lying on your back to sitting on the side of a flat bed without using bedrails?: A Little Help needed moving to and from a bed to a chair (including a wheelchair)?: A Little Help needed standing up from a chair using your arms (e.g., wheelchair or bedside chair)?: A Little Help needed to walk in hospital room?: A Little Help needed climbing 3-5 steps with a railing? : A Lot 6 Click Score: 17    End of Session Equipment Utilized During Treatment: Gait belt Activity Tolerance: Patient tolerated treatment well;Patient limited by pain Patient left: in chair;with call bell/phone within reach Nurse Communication: Mobility status PT Visit Diagnosis: Other abnormalities of gait and mobility (R26.89);Pain Pain - Right/Left: Left Pain - part of body: Knee     Time: 1025-8527 PT Time Calculation (min) (ACUTE ONLY): 28 min  Charges:  $Gait Training: 8-22 mins $Therapeutic Activity: 8-22 mins                     Mabeline Caras, PT, DPT Acute Rehabilitation Services  Pager [phone] Office [place] 06/07/2020, 9:21 AM

## 2020-06-07 NOTE — Progress Notes (Signed)
Subjective: 1 Day Post-Op Procedure(s) (LRB): LEFT TOTAL KNEE ARTHROPLASTY (Left) Patient reports pain as moderate.  Walked the halls with nurse last night  Objective: Vital signs in last 24 hours: Temp:  [97.7 F (36.5 C)-98.5 F (36.9 C)] 98.5 F (36.9 C) (11/23 0732) Pulse Rate:  [56-73] 62 (11/23 0732) Resp:  [12-20] 16 (11/23 0732) BP: (116-143)/(54-76) 138/72 (11/23 0732) SpO2:  [96 %-100 %] 99 % (11/23 0732)  Intake/Output from previous day: 11/22 0701 - 11/23 0700 In: 800 [P.O.:100; I.V.:700] Out: 8638 [Urine:1550; Blood:25] Intake/Output this shift: No intake/output data recorded.  Recent Labs    06/07/20 0531  HGB 9.5*   Recent Labs    06/07/20 0531  WBC 9.5  RBC 3.47*  HCT 29.5*  PLT 214   Recent Labs    06/07/20 0531  NA 137  K 3.6  CL 101  CO2 27  BUN 25*  CREATININE 1.29*  GLUCOSE 131*  CALCIUM 8.9   No results for input(s): LABPT, INR in the last 72 hours.  Neurologically intact Neurovascular intact Sensation intact distally Intact pulses distally Dorsiflexion/Plantar flexion intact Incision: scant drainage No cellulitis present Compartment soft   Assessment/Plan: 1 Day Post-Op Procedure(s) (LRB): LEFT TOTAL KNEE ARTHROPLASTY (Left) Advance diet Up with therapy D/C IV fluids Discharge home with home health after second PT session ONLY if she continues to progress and feels comfortable leaving.   D/c foley Continue IV/PO fluids for decreased renal function  Anticipated LOS equal to or greater than 2 midnights due to - Age 43 and older with one or more of the following:  - Obesity  - Expected need for hospital services (PT, OT, Nursing) required for safe  discharge  - Anticipated need for postoperative skilled nursing care or inpatient rehab  - Active co-morbidities: None OR   - Unanticipated findings during/Post Surgery: Slow post-op progression: GI, pain control, mobility  - Patient is a high risk of re-admission due to:  None    Rachel Crane 06/07/2020, 7:46 AM

## 2020-06-07 NOTE — Progress Notes (Signed)
Physical Therapy Treatment Patient Details Name: Rachel Crane MRN: [id number] DOB: Nov 09, 1945 Today's Date: 06/07/2020    History of Present Illness Pt is a 74 y.o. female s/p L TKA on 06/06/20. PMH includes R TKA, OSA on CPAP, HTN, asthma, pre-diabetes.   PT Comments    Pt progressing with mobility. Improved tolerance to gait training with RW and LE therex this session. Noted pt's plan to stay another day. Will continue to follow acutely.   Follow Up Recommendations  Home health PT;Supervision for mobility/OOB;Follow surgeon's recommendation for DC plan and follow-up therapies     Equipment Recommendations  None recommended by PT    Recommendations for Other Services       Precautions / Restrictions Precautions Precautions: Fall;Knee Restrictions Weight Bearing Restrictions: Yes LLE Weight Bearing: Weight bearing as tolerated    Mobility  Bed Mobility Overal bed mobility: Needs Assistance Bed Mobility: Supine to Sit     Supine to sit: Supervision;HOB elevated     General bed mobility comments: Use of bed rail, but no physical assist required this session  Transfers Overall transfer level: Needs assistance Equipment used: Rolling walker (2 wheeled) Transfers: Sit to/from Stand Sit to Stand: Min guard;From elevated surface         General transfer comment: Increased time and effort, reliant on momentum to power into standing, min guard for safety  Ambulation/Gait Ambulation/Gait assistance: Min guard Gait Distance (Feet): 180 Feet Assistive device: Rolling walker (2 wheeled) Gait Pattern/deviations: Step-through pattern;Decreased stride length;Decreased dorsiflexion - left;Decreased weight shift to left Gait velocity: Decreased   General Gait Details: Slow, antalgic gait with RW and intermittent min guard for balance; multiple standing breaks to flex/ext L knee and perform toe raises in attempt to alleviate pain/tightness   Stairs              Wheelchair Mobility    Modified Rankin (Stroke Patients Only)       Balance Overall balance assessment: Needs assistance Sitting-balance support: No upper extremity supported;Feet supported Sitting balance-Leahy Scale: Fair     Standing balance support: Bilateral upper extremity supported;During functional activity Standing balance-Leahy Scale: Fair Standing balance comment: Can static stand without UE support but unable to accept challenge                            Cognition Arousal/Alertness: Awake/alert Behavior During Therapy: WFL for tasks assessed/performed Overall Cognitive Status: Within Functional Limits for tasks assessed                                        Exercises General Exercises - Lower Extremity Ankle Circles/Pumps: AROM;Both;Seated Other Exercises Other Exercises: [employer] HEP handout (Access Code [id number]) provided and practiced - heel slides, SLR, LAQ, heel/toe raises    General Comments General comments (skin integrity, edema, etc.): dressing intact with two sites of dried blood noted. pt educated on washing around dressing       Pertinent Vitals/Pain Pain Assessment: Faces Pain Score: 7  Faces Pain Scale: Hurts even more Pain Location: L knee  Pain Descriptors / Indicators: Aching;Moaning;Operative site guarding;Grimacing Pain Intervention(s): Monitored during session;Limited activity within patient's tolerance;Repositioned    Home Living Family/patient expects to be discharged to:: Private residence Living Arrangements: Alone Available Help at Discharge: Family Type of Home: House Home Access: Stairs to enter Entrance Stairs-Rails: None Home Layout: One level Home  Equipment: Gilford Rile - 2 wheels;Bedside commode Additional Comments: plan to sponge bath/ has a toy poodle named COCO and neighbor in addition to daughter to (A) upon d/c. pt reports neighbor will even help with bathing    Prior Function Level of  Independence: Independent with assistive device(s)      Comments: Uses RW for mobility    PT Goals (current goals can now be found in the care plan section) Acute Rehab PT Goals Patient Stated Goal: to decrease pain  Progress towards PT goals: Progressing toward goals    Frequency    7X/week      PT Plan Current plan remains appropriate    Co-evaluation              AM-PAC PT "6 Clicks" Mobility   Outcome Measure  Help needed turning from your back to your side while in a flat bed without using bedrails?: None Help needed moving from lying on your back to sitting on the side of a flat bed without using bedrails?: None Help needed moving to and from a bed to a chair (including a wheelchair)?: A Little Help needed standing up from a chair using your arms (e.g., wheelchair or bedside chair)?: A Little Help needed to walk in hospital room?: A Little Help needed climbing 3-5 steps with a railing? : A Lot 6 Click Score: 19    End of Session   Activity Tolerance: Patient tolerated treatment well;Patient limited by pain Patient left: in chair;with call bell/phone within reach Nurse Communication: Mobility status PT Visit Diagnosis: Other abnormalities of gait and mobility (R26.89);Pain Pain - Right/Left: Left Pain - part of body: Knee     Time: 0315-9458 PT Time Calculation (min) (ACUTE ONLY): 27 min  Charges:  $Gait Training: 8-22 mins $Therapeutic Exercise: 8-22 mins                     Mabeline Caras, PT, DPT Acute Rehabilitation Services  Pager [phone] Office [place] 06/07/2020, 1:31 PM

## 2020-06-08 DIAGNOSIS — Z9049 Acquired absence of other specified parts of digestive tract: Secondary | ICD-10-CM | POA: Diagnosis not present

## 2020-06-08 DIAGNOSIS — Z803 Family history of malignant neoplasm of breast: Secondary | ICD-10-CM | POA: Diagnosis not present

## 2020-06-08 DIAGNOSIS — Z7982 Long term (current) use of aspirin: Secondary | ICD-10-CM | POA: Diagnosis not present

## 2020-06-08 DIAGNOSIS — Z96651 Presence of right artificial knee joint: Secondary | ICD-10-CM | POA: Diagnosis present

## 2020-06-08 DIAGNOSIS — I10 Essential (primary) hypertension: Secondary | ICD-10-CM | POA: Diagnosis present

## 2020-06-08 DIAGNOSIS — Z8261 Family history of arthritis: Secondary | ICD-10-CM | POA: Diagnosis not present

## 2020-06-08 DIAGNOSIS — J45909 Unspecified asthma, uncomplicated: Secondary | ICD-10-CM | POA: Diagnosis present

## 2020-06-08 DIAGNOSIS — M1712 Unilateral primary osteoarthritis, left knee: Secondary | ICD-10-CM | POA: Diagnosis present

## 2020-06-08 DIAGNOSIS — G4733 Obstructive sleep apnea (adult) (pediatric): Secondary | ICD-10-CM | POA: Diagnosis present

## 2020-06-08 DIAGNOSIS — Z8249 Family history of ischemic heart disease and other diseases of the circulatory system: Secondary | ICD-10-CM | POA: Diagnosis not present

## 2020-06-08 DIAGNOSIS — Z79899 Other long term (current) drug therapy: Secondary | ICD-10-CM | POA: Diagnosis not present

## 2020-06-08 DIAGNOSIS — Z6835 Body mass index (BMI) 35.0-35.9, adult: Secondary | ICD-10-CM | POA: Diagnosis not present

## 2020-06-08 DIAGNOSIS — Z885 Allergy status to narcotic agent status: Secondary | ICD-10-CM | POA: Diagnosis not present

## 2020-06-08 DIAGNOSIS — E669 Obesity, unspecified: Secondary | ICD-10-CM | POA: Diagnosis present

## 2020-06-08 NOTE — Progress Notes (Signed)
Subjective: 2 Days Post-Op Procedure(s) (LRB): LEFT TOTAL KNEE ARTHROPLASTY (Left) Patient reports pain as mild.  She has been itching since surgery.    Objective: Vital signs in last 24 hours: Temp:  [98.2 F (36.8 C)-99 F (37.2 C)] 99 F (37.2 C) (11/24 0747) Pulse Rate:  [63-78] 63 (11/24 0747) Resp:  [16-18] 17 (11/24 0747) BP: (150-184)/(64-77) 151/74 (11/24 0747) SpO2:  [96 %-100 %] 98 % (11/24 0747)  Intake/Output from previous day: 11/23 0701 - 11/24 0700 In: 240 [P.O.:240] Out: -  Intake/Output this shift: No intake/output data recorded.  Recent Labs    06/07/20 0531  HGB 9.5*   Recent Labs    06/07/20 0531  WBC 9.5  RBC 3.47*  HCT 29.5*  PLT 214   Recent Labs    06/07/20 0531  NA 137  K 3.6  CL 101  CO2 27  BUN 25*  CREATININE 1.29*  GLUCOSE 131*  CALCIUM 8.9   No results for input(s): LABPT, INR in the last 72 hours.  Neurologically intact Neurovascular intact Sensation intact distally Intact pulses distally Dorsiflexion/Plantar flexion intact Incision: scant drainage No cellulitis present Compartment soft   Assessment/Plan: 2 Days Post-Op Procedure(s) (LRB): LEFT TOTAL KNEE ARTHROPLASTY (Left) Advance diet Up with therapy Discharge home with home health WBAT LLE Discussed meds, etc with patient and nurse and it appears that she is likely itching from chg wipes F/u with Dr. Erlinda Hong 2 weeks po  Anticipated LOS equal to or greater than 2 midnights due to - Age 72 and older with one or more of the following:  - Obesity  - Expected need for hospital services (PT, OT, Nursing) required for safe  discharge  - Anticipated need for postoperative skilled nursing care or inpatient rehab  - Active co-morbidities: None OR   - Unanticipated findings during/Post Surgery: Slow post-op progression: GI, pain control, mobility  - Patient is a high risk of re-admission due to: None    Aundra Dubin 06/08/2020, 8:02 AM

## 2020-06-08 NOTE — Progress Notes (Signed)
Occupational Therapy Treatment Patient Details Name: Rachel Crane MRN: [id number] DOB: May 22, 1946 Today's Date: 06/08/2020    History of present illness Pt is a 74 y.o. female s/p L TKA on 06/06/20. PMH includes R TKA, OSA on CPAP, HTN, asthma, pre-diabetes.   OT comments  All education is complete and patient indicates understanding.Pt progressed to sponge bath in bathroom mod I (A) and dressing at EOB. Pt will d/c home today at adequate level.    Follow Up Recommendations  Home health OT    Equipment Recommendations  3 in 1 bedside commode;Other (comment) (reports doesnt want if insurance does not cover)    Recommendations for Other Services      Precautions / Restrictions Precautions Precautions: Fall;Knee Precaution Comments: Verbally reviewed knee precautions  Restrictions Weight Bearing Restrictions: Yes LLE Weight Bearing: Weight bearing as tolerated       Mobility Bed Mobility               General bed mobility comments: oob on arrival and in bathroom  Transfers Overall transfer level: Modified independent Equipment used: Rolling walker (2 wheeled) Transfers: Sit to/from Stand           General transfer comment: Mod indep with RW; supervision without DME due to fall risk; encouraged continued use of RW    Balance Overall balance assessment: Needs assistance Sitting-balance support: No upper extremity supported;Feet supported Sitting balance-Leahy Scale: Good     Standing balance support: Single extremity supported;During functional activity Standing balance-Leahy Scale: Fair Standing balance comment: pt abandons RW at times during session                           ADL either performed or assessed with clinical judgement   ADL Overall ADL's : Modified independent                                       General ADL Comments: pt walking around with ice pack on knee and educated on not leaving it on with ambulation. pt  reports she likes the support and the exercise for her leg. pt advised again that this is not advised yet at this stage of recovery. pt able to complete full sponge bath mod I and dressing with education to dress operative leg first. pt eager to d/c today     Vision       Perception     Praxis      Cognition Arousal/Alertness: Awake/alert Behavior During Therapy: WFL for tasks assessed/performed Overall Cognitive Status: Within Functional Limits for tasks assessed                                 General Comments: WFL for simple tasks, although contradicting self at times (i.e. wanting to walk far without brace, but educ on importance of not "overdoing it" and fall risk reduction, pt stating, "I won't fall or overdo it," but later stating, "Oh no I shouldn't do that, I need to recover")        Exercises     Shoulder Instructions       General Comments educated on avoiding cryotherapy wrap with movement and ice in resting positoin    Pertinent Vitals/ Pain       Pain Assessment: Faces Faces Pain Scale: Hurts a little bit  Pain Location: L knee  Pain Descriptors / Indicators: Guarding;Grimacing;Sore Pain Intervention(s): Monitored during session;Repositioned  Home Living                                          Prior Functioning/Environment              Frequency  Min 2X/week        Progress Toward Goals  OT Goals(current goals can now be found in the care plan section)  Progress towards OT goals: Progressing toward goals  Acute Rehab OT Goals Patient Stated Goal: to go home today OT Goal Formulation: With patient Time For Goal Achievement: 06/21/20 Potential to Achieve Goals: Good ADL Goals Pt Will Perform Lower Body Dressing: with modified independence;sit to/from stand Pt Will Transfer to Toilet: with modified independence;ambulating;bedside commode Additional ADL Goal #1: Pt will complete bed mobility MOD I as precursor  to adls.  Plan Discharge plan remains appropriate    Co-evaluation                 AM-PAC OT "6 Clicks" Daily Activity     Outcome Measure   Help from another person eating meals?: None Help from another person taking care of personal grooming?: None Help from another person toileting, which includes using toliet, bedpan, or urinal?: None Help from another person bathing (including washing, rinsing, drying)?: None Help from another person to put on and taking off regular upper body clothing?: None Help from another person to put on and taking off regular lower body clothing?: None 6 Click Score: 24    End of Session Equipment Utilized During Treatment: Rolling walker CPM Left Knee CPM Left Knee: Off  OT Visit Diagnosis: Unsteadiness on feet (R26.81);Muscle weakness (generalized) (M62.81);Pain Pain - Right/Left: Left Pain - part of body: Knee   Activity Tolerance Patient tolerated treatment well   Patient Left Other (comment) (sitting EOB )   Nurse Communication Mobility status;Precautions        Time: 0177-9390 OT Time Calculation (min): 13 min  Charges: OT General Charges $OT Visit: 1 Visit OT Treatments $Self Care/Home Management : 8-22 mins   Rachel Crane, OTR/L  Acute Rehabilitation Services Pager: [phone] Office: [phone] .    Jeri Modena 06/08/2020, 9:49 AM

## 2020-06-08 NOTE — Progress Notes (Signed)
Patient alert and oriented, mae's well, voiding adequate amount of urine, swallowing without difficulty, no c/o pain at time of discharge. Patient discharged home with family. Script and discharged instructions given to patient. Patient and family stated understanding of instructions given. Patient has an appointment with Dr. Xu 

## 2020-06-08 NOTE — Progress Notes (Signed)
Physical Therapy Treatment Patient Details Name: Rachel Crane MRN: [id number] DOB: March 06, 1946 Today's Date: 06/08/2020    History of Present Illness Pt is a 74 y.o. female s/p L TKA on 06/06/20. PMH includes R TKA, OSA on CPAP, HTN, asthma, pre-diabetes.   PT Comments    Pt progressing well with mobility. Able to perform continued gait and stair training with and without RW. Pt requires intermittent cues for safety and fall risk reduction, as pt requiring to do significant standing activities and walking without DME; at risk for falls. Reviewed educ re: fall risk reduction, precautions, positioning, activity recommendations, therex/ROM and importance of mobility. Pt preparing for d/c home this morning. Recommend follow-up with HHPT services.    Follow Up Recommendations  Home health PT;Supervision for mobility/OOB;Follow surgeon's recommendation for DC plan and follow-up therapies     Equipment Recommendations  None recommended by PT    Recommendations for Other Services       Precautions / Restrictions Precautions Precautions: Fall;Knee Precaution Comments: Verbally reviewed knee precautions  Restrictions Weight Bearing Restrictions: Yes LLE Weight Bearing: Weight bearing as tolerated    Mobility  Bed Mobility               General bed mobility comments: Received standing in room  Transfers Overall transfer level: Modified independent Equipment used: Rolling walker (2 wheeled) Transfers: Sit to/from Stand           General transfer comment: Mod indep with RW; supervision without DME due to fall risk; encouraged continued use of RW  Ambulation/Gait Ambulation/Gait assistance: Min guard;Supervision Gait Distance (Feet): 400 Feet Assistive device: Rolling walker (2 wheeled);None Gait Pattern/deviations: Step-through pattern;Decreased stride length;Antalgic;Decreased weight shift to left Gait velocity: Decreased   General Gait Details: Improved gait speed and  stability ambulating with RW at supervision-level; pt request to trial gait training without DME, min guard for balance, incraesed antalgic gait, encouraged continued RW use to increase WBAT through LLE and decrease fall risk   Stairs Stairs: Yes Stairs assistance: Min guard Stair Management: One rail Right;Step to pattern;Sideways Number of Stairs: 12 General stair comments: Ascend/descend steps with BUE support on single rail; pt moving fast, requiring repeated cues for correct technique/sequencing, eventually performing well with min guard for balance   Wheelchair Mobility    Modified Rankin (Stroke Patients Only)       Balance Overall balance assessment: Needs assistance Sitting-balance support: No upper extremity supported;Feet supported Sitting balance-Leahy Scale: Good     Standing balance support: Bilateral upper extremity supported;During functional activity;No upper extremity supported Standing balance-Leahy Scale: Fair                              Cognition Arousal/Alertness: Awake/alert Behavior During Therapy: WFL for tasks assessed/performed Overall Cognitive Status: Within Functional Limits for tasks assessed                                 General Comments: WFL for simple tasks, although contradicting self at times (i.e. wanting to walk far without brace, but educ on importance of not "overdoing it" and fall risk reduction, pt stating, "I won't fall or overdo it," but later stating, "Oh no I shouldn't do that, I need to recover")      Exercises      General Comments General comments (skin integrity, edema, etc.): Educ on importance of not wearing cryotherapy wrap  or getting a different brace for LLE because this discourages adequate quad activation post-op      Pertinent Vitals/Pain Pain Assessment: Faces Faces Pain Scale: Hurts a little bit Pain Location: L knee  Pain Descriptors / Indicators: Guarding;Grimacing;Sore Pain  Intervention(s): Monitored during session;Limited activity within patient's tolerance    Home Living                      Prior Function            PT Goals (current goals can now be found in the care plan section) Progress towards PT goals: Progressing toward goals    Frequency    7X/week      PT Plan Current plan remains appropriate    Co-evaluation              AM-PAC PT "6 Clicks" Mobility   Outcome Measure  Help needed turning from your back to your side while in a flat bed without using bedrails?: None Help needed moving from lying on your back to sitting on the side of a flat bed without using bedrails?: None Help needed moving to and from a bed to a chair (including a wheelchair)?: None Help needed standing up from a chair using your arms (e.g., wheelchair or bedside chair)?: None Help needed to walk in hospital room?: None Help needed climbing 3-5 steps with a railing? : A Little 6 Click Score: 23    End of Session Equipment Utilized During Treatment: Gait belt Activity Tolerance: Patient tolerated treatment well Patient left: with call bell/phone within reach (seated EOB) Nurse Communication: Mobility status PT Visit Diagnosis: Other abnormalities of gait and mobility (R26.89);Pain Pain - Right/Left: Left Pain - part of body: Knee     Time: 1829-9371 PT Time Calculation (min) (ACUTE ONLY): 18 min  Charges:  $Gait Training: 8-22 mins                     Mabeline Caras, PT, DPT Acute Rehabilitation Services  Pager [phone] Office [place] 06/08/2020, 9:17 AM

## 2020-06-10 ENCOUNTER — Telehealth: Payer: Self-pay | Admitting: *Deleted

## 2020-06-10 DIAGNOSIS — J302 Other seasonal allergic rhinitis: Secondary | ICD-10-CM | POA: Diagnosis not present

## 2020-06-10 DIAGNOSIS — M5416 Radiculopathy, lumbar region: Secondary | ICD-10-CM | POA: Diagnosis not present

## 2020-06-10 DIAGNOSIS — I10 Essential (primary) hypertension: Secondary | ICD-10-CM | POA: Diagnosis not present

## 2020-06-10 DIAGNOSIS — Z471 Aftercare following joint replacement surgery: Secondary | ICD-10-CM | POA: Diagnosis not present

## 2020-06-10 DIAGNOSIS — M4804 Spinal stenosis, thoracic region: Secondary | ICD-10-CM | POA: Diagnosis not present

## 2020-06-10 DIAGNOSIS — J4541 Moderate persistent asthma with (acute) exacerbation: Secondary | ICD-10-CM | POA: Diagnosis not present

## 2020-06-10 DIAGNOSIS — Z7982 Long term (current) use of aspirin: Secondary | ICD-10-CM | POA: Diagnosis not present

## 2020-06-10 DIAGNOSIS — I358 Other nonrheumatic aortic valve disorders: Secondary | ICD-10-CM | POA: Diagnosis not present

## 2020-06-10 DIAGNOSIS — G4733 Obstructive sleep apnea (adult) (pediatric): Secondary | ICD-10-CM | POA: Diagnosis not present

## 2020-06-10 DIAGNOSIS — E785 Hyperlipidemia, unspecified: Secondary | ICD-10-CM | POA: Diagnosis not present

## 2020-06-10 DIAGNOSIS — Z96653 Presence of artificial knee joint, bilateral: Secondary | ICD-10-CM | POA: Diagnosis not present

## 2020-06-10 NOTE — Telephone Encounter (Signed)
Ortho bundle D/C call completed. 

## 2020-06-13 ENCOUNTER — Encounter (HOSPITAL_COMMUNITY): Payer: Self-pay | Admitting: Orthopaedic Surgery

## 2020-06-13 ENCOUNTER — Other Ambulatory Visit: Payer: Self-pay | Admitting: Physician Assistant

## 2020-06-13 ENCOUNTER — Other Ambulatory Visit: Payer: Self-pay | Admitting: *Deleted

## 2020-06-13 ENCOUNTER — Telehealth: Payer: Self-pay

## 2020-06-13 ENCOUNTER — Telehealth: Payer: Self-pay | Admitting: *Deleted

## 2020-06-13 DIAGNOSIS — I10 Essential (primary) hypertension: Secondary | ICD-10-CM | POA: Diagnosis not present

## 2020-06-13 DIAGNOSIS — Z471 Aftercare following joint replacement surgery: Secondary | ICD-10-CM | POA: Diagnosis not present

## 2020-06-13 DIAGNOSIS — M1712 Unilateral primary osteoarthritis, left knee: Secondary | ICD-10-CM

## 2020-06-13 DIAGNOSIS — Z96652 Presence of left artificial knee joint: Secondary | ICD-10-CM

## 2020-06-13 DIAGNOSIS — E785 Hyperlipidemia, unspecified: Secondary | ICD-10-CM | POA: Diagnosis not present

## 2020-06-13 DIAGNOSIS — J302 Other seasonal allergic rhinitis: Secondary | ICD-10-CM | POA: Diagnosis not present

## 2020-06-13 DIAGNOSIS — J4541 Moderate persistent asthma with (acute) exacerbation: Secondary | ICD-10-CM | POA: Diagnosis not present

## 2020-06-13 DIAGNOSIS — I358 Other nonrheumatic aortic valve disorders: Secondary | ICD-10-CM | POA: Diagnosis not present

## 2020-06-13 MED ORDER — OXYCODONE-ACETAMINOPHEN 5-325 MG PO TABS
1.0000 | ORAL_TABLET | Freq: Four times a day (QID) | ORAL | 0 refills | Status: DC | PRN
Start: 2020-06-13 — End: 2020-06-20

## 2020-06-13 NOTE — Telephone Encounter (Signed)
I just sent in

## 2020-06-13 NOTE — Telephone Encounter (Signed)
Please advise 

## 2020-06-13 NOTE — Telephone Encounter (Signed)
7 day Ortho bundle call completed. OPPT scheduled and attempted to re-call patient to notify. Will call again tomorrow if don't get her today.

## 2020-06-13 NOTE — Telephone Encounter (Signed)
Patient would like a Rx refill on Oxycodone?  Cb# [phone].  Please advise.  Thank you.

## 2020-06-13 NOTE — Telephone Encounter (Signed)
I called patient and advised. 

## 2020-06-14 ENCOUNTER — Telehealth: Payer: Self-pay | Admitting: *Deleted

## 2020-06-14 ENCOUNTER — Telehealth: Payer: Self-pay | Admitting: Internal Medicine

## 2020-06-14 NOTE — Care Plan (Signed)
Call received from [facility] liaison last night stating his therapist did end up seeing patient, but gathered some conflicting information about her wishes with therapy and starting OPPT. CM contacted patient today to verify that she would like to start OPPT earlier than previously discussed. She agrees this would be best for her. Explained her appointment is scheduled for Thursday, 06/16/20 at [facility] at 1:45 pm. She requested transportation services. Call to [facility] services and set up appointment. Staff there Suezanne Jacquet) was going to reach out and explain process to patient for pick up on this date.

## 2020-06-14 NOTE — Telephone Encounter (Signed)
Ortho bundle call completed. 

## 2020-06-14 NOTE — Telephone Encounter (Signed)
   Rachel Crane DOB: 04-10-1946 MRN: [id number]   RIDER WAIVER AND RELEASE OF LIABILITY  For purposes of improving physical access to our facilities, [place] is pleased to partner with third parties to provide [facility] patients or other authorized individuals the option of convenient, on-demand ground transportation services (the [employer]") through use of the technology service that enables users to request on-demand ground transportation from independent third-party providers.  By opting to use and accept these [employer], I, the undersigned, hereby agree on behalf of myself, and on behalf of any minor child using the [employer] for whom I am the parent or legal guardian, as follows:  1. Government social research officer provided to me are provided by independent third-party transportation providers who are not [employer] or employees and who are unaffiliated with [employer]. 2. [facility] is neither a transportation carrier nor a common or public carrier. 3. [facility] has no control over the quality or safety of the transportation that occurs as a result of the [employer]. 4. [facility] cannot guarantee that any third-party transportation provider will complete any arranged transportation service. 5. [facility] makes no representation, warranty, or guarantee regarding the reliability, timeliness, quality, safety, suitability, or availability of any of the Transport Services or that they will be error free. 6. I fully understand that traveling by vehicle involves risks and dangers of serious bodily injury, including permanent disability, paralysis, and death. I agree, on behalf of myself and on behalf of any minor child using the Transport Services for whom I am the parent or legal guardian, that the entire risk arising out of my use of the [employer] remains solely with me, to the maximum extent permitted under applicable law. 7. The [employer]  are provided "as is" and "as available." [facility] disclaims all representations and warranties, express, implied or statutory, not expressly set out in these terms, including the implied warranties of merchantability and fitness for a particular purpose. 8. I hereby waive and release [facility], its agents, employees, officers, directors, representatives, insurers, attorneys, assigns, successors, subsidiaries, and affiliates from any and all past, present, or future claims, demands, liabilities, actions, causes of action, or suits of any kind directly or indirectly arising from acceptance and use of the [employer]. 9. I further waive and release [facility] and its affiliates from all present and future liability and responsibility for any injury or death to persons or damages to property caused by or related to the use of the [employer]. 10. I have read this Waiver and Release of Liability, and I understand the terms used in it and their legal significance. This Waiver is freely and voluntarily given with the understanding that my right (as well as the right of any minor child for whom I am the parent or legal guardian using the [employer]) to legal recourse against [facility] in connection with the [employer] is knowingly surrendered in return for use of these services.   I attest that I read the consent document to [name], gave Ms. Walthers the opportunity to ask questions and answered the questions asked (if any). I affirm that [name] then provided consent for she's participation in this program.     [name]

## 2020-06-15 DIAGNOSIS — Z471 Aftercare following joint replacement surgery: Secondary | ICD-10-CM | POA: Diagnosis not present

## 2020-06-15 DIAGNOSIS — I10 Essential (primary) hypertension: Secondary | ICD-10-CM | POA: Diagnosis not present

## 2020-06-15 DIAGNOSIS — J4541 Moderate persistent asthma with (acute) exacerbation: Secondary | ICD-10-CM | POA: Diagnosis not present

## 2020-06-15 DIAGNOSIS — J302 Other seasonal allergic rhinitis: Secondary | ICD-10-CM | POA: Diagnosis not present

## 2020-06-15 DIAGNOSIS — E785 Hyperlipidemia, unspecified: Secondary | ICD-10-CM | POA: Diagnosis not present

## 2020-06-15 DIAGNOSIS — I358 Other nonrheumatic aortic valve disorders: Secondary | ICD-10-CM | POA: Diagnosis not present

## 2020-06-16 ENCOUNTER — Encounter: Payer: Self-pay | Admitting: Rehabilitative and Restorative Service Providers"

## 2020-06-16 ENCOUNTER — Other Ambulatory Visit: Payer: Self-pay

## 2020-06-16 ENCOUNTER — Ambulatory Visit: Payer: Medicare Other | Admitting: Allergy

## 2020-06-16 ENCOUNTER — Telehealth: Payer: Self-pay | Admitting: Orthopaedic Surgery

## 2020-06-16 ENCOUNTER — Ambulatory Visit (INDEPENDENT_AMBULATORY_CARE_PROVIDER_SITE_OTHER): Payer: Medicare Other | Admitting: Rehabilitative and Restorative Service Providers"

## 2020-06-16 DIAGNOSIS — M6281 Muscle weakness (generalized): Secondary | ICD-10-CM | POA: Diagnosis not present

## 2020-06-16 DIAGNOSIS — M25662 Stiffness of left knee, not elsewhere classified: Secondary | ICD-10-CM

## 2020-06-16 DIAGNOSIS — G8929 Other chronic pain: Secondary | ICD-10-CM | POA: Diagnosis not present

## 2020-06-16 DIAGNOSIS — R600 Localized edema: Secondary | ICD-10-CM

## 2020-06-16 DIAGNOSIS — M25562 Pain in left knee: Secondary | ICD-10-CM | POA: Diagnosis not present

## 2020-06-16 DIAGNOSIS — R262 Difficulty in walking, not elsewhere classified: Secondary | ICD-10-CM

## 2020-06-16 NOTE — Telephone Encounter (Signed)
Received disability/FLMA paperwork and $25.00 check for [facility]. Forwarding to [employer]

## 2020-06-16 NOTE — Therapy (Signed)
[facility] [address] [place], [place], [address] Phone: [phone]   Fax:  [phone]  Physical Therapy Evaluation  Patient Details  Name: Rachel Crane MRN: [id number] Date of Birth: [birth year] Referring Provider (PT): Dr. Erlinda Hong   Encounter Date: 06/16/2020   PT End of Session - 06/16/20 1530    Visit Number 1    Number of Visits 12    Date for PT Re-Evaluation 08/11/20    Authorization Type Medicare    Progress Note Due on Visit 10    PT Start Time 1400    PT Stop Time 1440    PT Time Calculation (min) 40 min    Equipment Utilized During Treatment Gait belt    Activity Tolerance Patient tolerated treatment well    Behavior During Therapy [facility] for tasks assessed/performed           Past Medical History:  Diagnosis Date  . Abnormal stress ECG 10/06/2018  . Aortic valve sclerosis 10/06/2018  . Arthritis   . Asthma   . Bilateral carotid bruits 10/06/2018  . Headache    H/O bad HA, since using CPAP- she has had improvement with that issue   . Hypertension   . Laboratory examination 10/06/2018  . OSA on CPAP    settting - 8, uses CPAP q night   . Pre-diabetes   . Shingles     Past Surgical History:  Procedure Laterality Date  . CHOLECYSTECTOMY  1998  . TOTAL KNEE ARTHROPLASTY Right 09/19/2017   Procedure: RIGHT TOTAL KNEE ARTHROPLASTY;  Surgeon: Leandrew Koyanagi, MD;  Location: [place];  Service: Orthopedics;  Laterality: Right;  . TOTAL KNEE ARTHROPLASTY Left 06/06/2020   Procedure: LEFT TOTAL KNEE ARTHROPLASTY;  Surgeon: Leandrew Koyanagi, MD;  Location: [place];  Service: Orthopedics;  Laterality: Left;  . TUBAL LIGATION      There were no vitals filed for this visit.    Subjective Assessment - 06/16/20 1404    Subjective Pt. comes to clinic s/p Lt TKA.  Pt. stated pain occurs and somtimes pain medicine doesn't cover for the whole 6 hours.    Pertinent History PMH: lumbar radicupathy, advanced Lt knee OA, Rt TKA 2019,HTN,OSA on CPAP      Limitations Walking;House hold activities;Sitting;Lifting;Standing    Currently in Pain? Yes    Pain Score 5    pain at worst 10 /10   Pain Location Knee    Pain Orientation Left    Pain Descriptors / Indicators Aching;Throbbing;Tightness    Pain Type Surgical pain    Pain Onset 1 to 4 weeks ago    Pain Frequency Constant    Aggravating Factors  consistent pain during day that varies, bending, sitting prolonged, walking    Pain Relieving Factors Rest, ice, medicine at times    Effect of Pain on Daily Activities Limited in walking              [facility] PT Assessment - 06/16/20 0001      Assessment   Medical Diagnosis Lt TKA    Referring Provider (PT) Dr. Erlinda Hong    Onset Date/Surgical Date 06/06/20      Precautions   Precautions None      Restrictions   Weight Bearing Restrictions No      Balance Screen   Has the patient fallen in the past 6 months No      [place] residence    Additional Comments one step to  enter house, has second floor but doesn't have to use      Prior Function   Vocation Requirements 4 hours part time work, standing/sitting at [employer]      Observation/Other Assessments   Focus on Therapeutic Outcomes (FOTO)  intake 34 %, expected outcome 62 %      Functional Tests   Functional tests Sit to Stand;Single leg stance      Single Leg Stance   Comments unable on Lt LE, 5 seconds on Rt LE      Sit to Stand   Comments able to perform c noted deviation to Rt LE from 18 inch chair height on 2 attempts      ROM / Strength   AROM / PROM / Strength PROM      AROM   Overall AROM Comments LAQ in sitting    Right/Left Knee Left;Right    Left Knee Extension -19   pain   Left Knee Flexion 100   pain     PROM   Overall PROM Comments measured supine    PROM Assessment Site Knee    Right/Left Knee Left;Right    Left Knee Extension -10   pain   Left Knee Flexion 105   pain     Strength   Strength Assessment Site  Ankle;Knee;Hip    Right/Left Hip Left;Right    Right Hip Flexion 5/5    Left Hip Flexion 4+/5    Right/Left Knee Left;Right    Right Knee Flexion 5/5    Right Knee Extension 5/5    Left Knee Flexion 4/5    Left Knee Extension 2+/5    Right/Left Ankle Left;Right    Right Ankle Dorsiflexion 5/5    Left Ankle Dorsiflexion 5/5      Ambulation/Gait   Gait Comments FWW c reduced step length c Rt LE, reduced stance on Lt                      Objective measurements completed on examination: See above findings.       [facility] Adult PT Treatment/Exercise - 06/16/20 0001      Exercises   Exercises Other Exercises;Knee/Hip    Other Exercises  HEP instruction/performance per cues for techniques.  Additional time spent in education of techniques.        Knee/Hip Exercises: Seated   Other Seated Knee/Hip Exercises LAQ c contralateral opposite movement x 15       Knee/Hip Exercises: Supine   Other Supine Knee/Hip Exercises supine TKE heel prop 3 mins, heel slide x 10 Lt LE, SLR x 10                  PT Education - 06/16/20 1531    Education Details HEP, POC    Person(s) Educated Patient    Methods Explanation;Demonstration;Verbal cues;Handout    Comprehension Returned demonstration;Verbalized understanding            PT Short Term Goals - 06/16/20 1530      PT SHORT TERM GOAL #1   Title Patient will demonstrate independent use of home exercise program to maintain progress from in clinic treatments.    Time 3    Period Weeks    Status New    Target Date 07/07/20             PT Long Term Goals - 06/16/20 1527      PT LONG TERM GOAL #1   Title Patient will demonstrate/report  pain at worst less than or equal to 2/10 to facilitate minimal limitation in daily activity secondary to pain symptoms.    Time 8    Period Weeks    Status New    Target Date 08/11/20      PT LONG TERM GOAL #2   Title Patient will demonstrate independent use of home exercise  program to facilitate ability to maintain/progress functional gains from skilled physical therapy services.    Time 8    Period Weeks    Status New    Target Date 08/11/20      PT LONG TERM GOAL #3   Title Patient will demonstrate Lt knee AROM 0-110 degrees to facilitate ability to perform transfers, sitting, ambulation, stair navigation s restriction due to mobility.    Time 8    Period Weeks    Status New    Target Date 08/11/20      PT LONG TERM GOAL #4   Title Patient will demonstrate Lt LE MMT 5/5 throughout to facilitate ability to perform usual standing, walking, stairs at PLOF s limitation due to symptoms.    Time 8    Period Weeks    Status New    Target Date 08/11/20      PT LONG TERM GOAL #5   Title Pt. will demonstrate independent ambulation community distances > 300 ft for usual ambulation mobility.    Time 8    Period Weeks    Status New    Target Date 08/11/20                  Plan - 06/16/20 1459    Clinical Impression Statement Patient is a  74 y.o. female who comes to clinic with complaints of Lt knee pain with mobility, strength and movement coordination deficits s/p recent TKA that impair their ability to perform usual daily and recreational functional activities without increase difficulty/symptoms at this time.  Patient to benefit from skilled PT services to address impairments and limitations to improve to previous level of function without restriction secondary to condition.    Personal Factors and Comorbidities Comorbidity 3+    Comorbidities PMH: advanced Lt knee OA, Rt TKA 2019,HTN,OSA on CPAP    Examination-Activity Limitations Bend;Carry;Dressing;Lift;Stand;Stairs;Squat;Locomotion Level;Transfers;Sit;Sleep;Bed Mobility;Bathing;Hygiene/Grooming    Examination-Participation Restrictions Cleaning;Community Activity;Laundry;Shop;Meal Prep    Stability/Clinical Decision Making Evolving/Moderate complexity    Clinical Decision Making Moderate     Rehab Potential Good    PT Frequency 2x / week    PT Duration 8 weeks    PT Treatment/Interventions ADLs/Self Care Home Management;Cryotherapy;Electrical Stimulation;Iontophoresis 4mg /ml Dexamethasone;Moist Heat;Traction;Parrafin;Gait training;Stair training;Therapeutic activities;Therapeutic exercise;Balance training;Neuromuscular re-education;Manual techniques;Dry needling;Passive range of motion;Joint Manipulations;Spinal Manipulations;Taping;Vasopneumatic Device;Patient/family education    PT Next Visit Plan Review HEP, progress manual mobility for extension/flexion, balance as appropriate.    PT Home Exercise Plan [email or web address]    Consulted and Agree with Plan of Care Patient           Patient will benefit from skilled therapeutic intervention in order to improve the following deficits and impairments:  Abnormal gait, Decreased activity tolerance, Decreased balance, Decreased endurance, Decreased range of motion, Decreased mobility, Decreased strength, Hypomobility, Increased edema, Difficulty walking, Impaired flexibility, Increased muscle spasms, Increased fascial restricitons, Pain, Improper body mechanics, Postural dysfunction  Visit Diagnosis: Chronic pain of left knee  Muscle weakness (generalized)  Difficulty in walking, not elsewhere classified  Stiffness of left knee, not elsewhere classified  Localized edema     Problem List Patient Active Problem List  Diagnosis Date Noted  . Status post total left knee replacement 06/06/2020  . Positive ANA (antinuclear antibody) 04/25/2020  . Primary osteoarthritis of left knee 04/14/2020  . Lumbar radiculopathy 06/26/2019  . Left-sided low back pain with left-sided sciatica 06/26/2019  . Thoracic spinal stenosis 04/18/2019  . Seasonal allergies 12/04/2018  . Allergic rhinoconjunctivitis 10/10/2018  . Moderate persistent asthma with acute exacerbation 10/10/2018  . Pruritus 10/10/2018  . Aortic valve sclerosis 10/06/2018  .  Bilateral carotid bruits 10/06/2018  . Abnormal stress ECG 10/06/2018  . Laboratory examination 10/06/2018  . Palpitations 10/06/2018  . Total knee replacement status 09/19/2017  . Sprain of anterior talofibular ligament of right ankle 12/13/2016    Scot Jun, PT, DPT, OCS, ATC 06/16/20  3:33 PM    [facility] [address] [place], [place], [address] Phone: [phone]   Fax:  [phone]  Name: MARCINA KINNISON MRN: [id number] Date of Birth: Feb 15, 1946

## 2020-06-16 NOTE — Patient Instructions (Signed)
Access Code: [email or web address] URL: [email or web address] Date: 06/16/2020 Prepared by: Scot Jun  Exercises Small Range Straight Leg Raise - 2 x daily - 7 x weekly - 3 sets - 10 reps Supine Heel Slide - 3 x daily - 7 x weekly - 3 sets - 10 reps - 2 hold Seated Long Arc Quad - 2 x daily - 7 x weekly - 3 sets - 10 reps - 2 hold Supine Knee Extension Mobilization with Weight - 4 x daily - 7 x weekly - 1 sets - 1 reps - up to 15 mins hold

## 2020-06-20 ENCOUNTER — Telehealth: Payer: Self-pay

## 2020-06-20 ENCOUNTER — Other Ambulatory Visit: Payer: Self-pay | Admitting: Physician Assistant

## 2020-06-20 MED ORDER — METHOCARBAMOL 500 MG PO TABS
500.0000 mg | ORAL_TABLET | Freq: Two times a day (BID) | ORAL | 0 refills | Status: DC | PRN
Start: 2020-06-20 — End: 2020-06-23

## 2020-06-20 MED ORDER — OXYCODONE-ACETAMINOPHEN 5-325 MG PO TABS
1.0000 | ORAL_TABLET | Freq: Three times a day (TID) | ORAL | 0 refills | Status: DC | PRN
Start: 2020-06-20 — End: 2020-06-23

## 2020-06-20 NOTE — Telephone Encounter (Signed)
Patient called she is requesting rx refill for oxycodone she is also requesting rx for muscle relaxers. XO:[phone]

## 2020-06-20 NOTE — Telephone Encounter (Signed)
Sent in.  Make sure she is aware we are weaning percocet to tid

## 2020-06-20 NOTE — Telephone Encounter (Signed)
Called patient to let her know no answer. LMOM.

## 2020-06-21 ENCOUNTER — Ambulatory Visit (INDEPENDENT_AMBULATORY_CARE_PROVIDER_SITE_OTHER): Payer: Medicare Other | Admitting: Physical Therapy

## 2020-06-21 ENCOUNTER — Other Ambulatory Visit: Payer: Self-pay

## 2020-06-21 ENCOUNTER — Encounter: Payer: Self-pay | Admitting: Physical Therapy

## 2020-06-21 ENCOUNTER — Telehealth: Payer: Self-pay | Admitting: *Deleted

## 2020-06-21 ENCOUNTER — Ambulatory Visit (INDEPENDENT_AMBULATORY_CARE_PROVIDER_SITE_OTHER): Payer: Medicare Other | Admitting: Physician Assistant

## 2020-06-21 ENCOUNTER — Encounter: Payer: Self-pay | Admitting: Orthopaedic Surgery

## 2020-06-21 VITALS — Ht 66.0 in | Wt 217.0 lb

## 2020-06-21 DIAGNOSIS — G8929 Other chronic pain: Secondary | ICD-10-CM

## 2020-06-21 DIAGNOSIS — R262 Difficulty in walking, not elsewhere classified: Secondary | ICD-10-CM

## 2020-06-21 DIAGNOSIS — R600 Localized edema: Secondary | ICD-10-CM

## 2020-06-21 DIAGNOSIS — M5442 Lumbago with sciatica, left side: Secondary | ICD-10-CM | POA: Diagnosis not present

## 2020-06-21 DIAGNOSIS — M25562 Pain in left knee: Secondary | ICD-10-CM

## 2020-06-21 DIAGNOSIS — M6281 Muscle weakness (generalized): Secondary | ICD-10-CM

## 2020-06-21 DIAGNOSIS — M25662 Stiffness of left knee, not elsewhere classified: Secondary | ICD-10-CM | POA: Diagnosis not present

## 2020-06-21 DIAGNOSIS — Z96652 Presence of left artificial knee joint: Secondary | ICD-10-CM

## 2020-06-21 DIAGNOSIS — R2689 Other abnormalities of gait and mobility: Secondary | ICD-10-CM

## 2020-06-21 NOTE — Progress Notes (Signed)
Post-Op Visit Note   Patient: Rachel Crane           Date of Birth: 10-Oct-1945           MRN: [id number] Visit Date: 06/21/2020 PCP: Leeroy Cha, MD   Assessment & Plan:  Chief Complaint:  Chief Complaint  Patient presents with  . Left Knee - Follow-up, Routine Post Op    Left total knee arthroplasty 06/06/2020   Visit Diagnoses:  1. Hx of total knee replacement, left     Plan: Patient is a pleasant 74 year old female who comes in today 2 weeks out left total knee replacement 06/06/2020.  She has been doing well.  She has been in outpatient physical progress.  She is ambulating with a walker today.  Examination of her left knee reveals a well-healing surgical incision with nylon sutures in place.  No evidence of infection or cellulitis.  Calf is soft nontender.  She is neurovascular intact distally.  At this point, sutures were removed and Steri-Strips applied.  She will continue with physical therapy.  She will follow up with [place] in 4 weeks time or 2 view x-rays of the left knee.  Dental prophylaxis reinforced.  Call with concerns or questions in the meantime.  Follow-Up Instructions: Return in about 4 weeks (around 07/19/2020).   Orders:  No orders of the defined types were placed in this encounter.  No orders of the defined types were placed in this encounter.   Imaging: No new imaging  PMFS History: Patient Active Problem List   Diagnosis Date Noted  . Status post total left knee replacement 06/06/2020  . Positive ANA (antinuclear antibody) 04/25/2020  . Primary osteoarthritis of left knee 04/14/2020  . Lumbar radiculopathy 06/26/2019  . Left-sided low back pain with left-sided sciatica 06/26/2019  . Thoracic spinal stenosis 04/18/2019  . Seasonal allergies 12/04/2018  . Allergic rhinoconjunctivitis 10/10/2018  . Moderate persistent asthma with acute exacerbation 10/10/2018  . Pruritus 10/10/2018  . Aortic valve sclerosis 10/06/2018  . Bilateral carotid  bruits 10/06/2018  . Abnormal stress ECG 10/06/2018  . Laboratory examination 10/06/2018  . Palpitations 10/06/2018  . Total knee replacement status 09/19/2017  . Sprain of anterior talofibular ligament of right ankle 12/13/2016   Past Medical History:  Diagnosis Date  . Abnormal stress ECG 10/06/2018  . Aortic valve sclerosis 10/06/2018  . Arthritis   . Asthma   . Bilateral carotid bruits 10/06/2018  . Headache    H/O bad HA, since using CPAP- she has had improvement with that issue   . Hypertension   . Laboratory examination 10/06/2018  . OSA on CPAP    settting - 8, uses CPAP q night   . Pre-diabetes   . Shingles     Family History  Problem Relation Age of Onset  . Breast cancer Sister   . Hypertension Mother   . Arthritis Mother     Past Surgical History:  Procedure Laterality Date  . CHOLECYSTECTOMY  1998  . TOTAL KNEE ARTHROPLASTY Right 09/19/2017   Procedure: RIGHT TOTAL KNEE ARTHROPLASTY;  Surgeon: Leandrew Koyanagi, MD;  Location: [place];  Service: Orthopedics;  Laterality: Right;  . TOTAL KNEE ARTHROPLASTY Left 06/06/2020   Procedure: LEFT TOTAL KNEE ARTHROPLASTY;  Surgeon: Leandrew Koyanagi, MD;  Location: [place];  Service: Orthopedics;  Laterality: Left;  . TUBAL LIGATION     Social History   Occupational History  . Not on file  Tobacco Use  . Smoking status: Never  Smoker  . Smokeless tobacco: Never Used  Vaping Use  . Vaping Use: Never used  Substance and Sexual Activity  . Alcohol use: Not Currently  . Drug use: No  . Sexual activity: Not on file

## 2020-06-21 NOTE — Patient Instructions (Signed)
Access Code: [email or web address] URL: [email or web address] Date: 06/21/2020 Prepared by: Jamey Reas  Exercises Small Range Straight Leg Raise - 2 x daily - 7 x weekly - 3 sets - 10 reps Supine Quad Set - 2 x daily - 7 x weekly - 3 sets - 10 reps - 5 seconds hold Seated Long Arc Quad - 2 x daily - 7 x weekly - 3 sets - 10 reps - 2 hold Supine Heel Slide - 3 x daily - 7 x weekly - 3 sets - 10 reps - 2 hold Supine Knee Extension Mobilization with Weight - 4 x daily - 7 x weekly - 1 sets - 1 reps - up to 15 mins hold

## 2020-06-21 NOTE — Telephone Encounter (Signed)
Ortho bundle 14 day call completed. 

## 2020-06-21 NOTE — Therapy (Signed)
[facility] [place], [place], [address] Phone: [phone]   Fax:  [phone]  Physical Therapy Treatment  Patient Details  Name: Rachel Crane MRN: [id number] Date of Birth: 1945-12-02 Referring Provider (PT): Dr. Erlinda Hong   Encounter Date: 06/21/2020   PT End of Session - 06/21/20 1115    Visit Number 2    Number of Visits 12    Date for PT Re-Evaluation 08/11/20    Authorization Type Medicare    Progress Note Due on Visit 10    PT Start Time 1107    PT Stop Time 1155    PT Time Calculation (min) 48 min    Equipment Utilized During Treatment Gait belt    Activity Tolerance Patient tolerated treatment well    Behavior During Therapy [facility] for tasks assessed/performed           Past Medical History:  Diagnosis Date  . Abnormal stress ECG 10/06/2018  . Aortic valve sclerosis 10/06/2018  . Arthritis   . Asthma   . Bilateral carotid bruits 10/06/2018  . Headache    H/O bad HA, since using CPAP- she has had improvement with that issue   . Hypertension   . Laboratory examination 10/06/2018  . OSA on CPAP    settting - 8, uses CPAP q night   . Pre-diabetes   . Shingles     Past Surgical History:  Procedure Laterality Date  . CHOLECYSTECTOMY  1998  . TOTAL KNEE ARTHROPLASTY Right 09/19/2017   Procedure: RIGHT TOTAL KNEE ARTHROPLASTY;  Surgeon: Leandrew Koyanagi, MD;  Location: [place];  Service: Orthopedics;  Laterality: Right;  . TOTAL KNEE ARTHROPLASTY Left 06/06/2020   Procedure: LEFT TOTAL KNEE ARTHROPLASTY;  Surgeon: Leandrew Koyanagi, MD;  Location: [place];  Service: Orthopedics;  Laterality: Left;  . TUBAL LIGATION      There were no vitals filed for this visit.   Subjective Assessment - 06/21/20 1112    Subjective She has been doing her exercises.    Pertinent History PMH: lumbar radicupathy, advanced Lt knee OA, Rt TKA 2019,HTN,OSA on CPAP    Limitations Walking;House hold activities;Sitting;Lifting;Standing    Currently in  Pain? Yes    Pain Score 6    since PT eval, worst 9/10 & best 3/10   Pain Location Knee    Pain Orientation Left    Pain Descriptors / Indicators Cramping;Sharp;Stabbing;Tightness    Pain Type Acute pain;Surgical pain    Pain Onset 1 to 4 weeks ago    Pain Frequency Constant    Aggravating Factors  standing, walking & bending    Pain Relieving Factors ice & medication                             [facility] Adult PT Treatment/Exercise - 06/21/20 1107      Knee/Hip Exercises: Stretches   Knee: Self-Stretch to increase Flexion Left;60 seconds;3 reps    Knee: Self-Stretch Limitations sitting with low tension stretch knee flexion      Knee/Hip Exercises: Aerobic   Nustep Seat 11 level 3 for 8 min      Knee/Hip Exercises: Seated   Long Arc Quad AROM;Strengthening;Left;1 set;10 reps    Long Arc Quad Limitations towel roll as fulcrum for improved knee motion    Other Seated Knee/Hip Exercises reclined SLR small range 10 reps 2 sets  PT instructed seated option to supine.      Other  Seated Knee/Hip Exercises reclined quad set with foot on towel 5 sec hold 10 reps 2 sets.  Seated or supine option.        Knee/Hip Exercises: Supine   Heel Slides AAROM;Left;2 sets;10 reps    Heel Slides Limitations foot on ball using strap for flexion.       Vasopneumatic   Number Minutes Vasopneumatic  10 minutes    Vasopnuematic Location  Knee    Vasopneumatic Pressure Medium    Vasopneumatic Temperature  34                    PT Short Term Goals - 06/16/20 1530      PT SHORT TERM GOAL #1   Title Patient will demonstrate independent use of home exercise program to maintain progress from in clinic treatments.    Time 3    Period Weeks    Status New    Target Date 07/07/20             PT Long Term Goals - 06/16/20 1527      PT LONG TERM GOAL #1   Title Patient will demonstrate/report pain at worst less than or equal to 2/10 to facilitate minimal limitation in daily  activity secondary to pain symptoms.    Time 8    Period Weeks    Status New    Target Date 08/11/20      PT LONG TERM GOAL #2   Title Patient will demonstrate independent use of home exercise program to facilitate ability to maintain/progress functional gains from skilled physical therapy services.    Time 8    Period Weeks    Status New    Target Date 08/11/20      PT LONG TERM GOAL #3   Title Patient will demonstrate Lt knee AROM 0-110 degrees to facilitate ability to perform transfers, sitting, ambulation, stair navigation s restriction due to mobility.    Time 8    Period Weeks    Status New    Target Date 08/11/20      PT LONG TERM GOAL #4   Title Patient will demonstrate Lt LE MMT 5/5 throughout to facilitate ability to perform usual standing, walking, stairs at PLOF s limitation due to symptoms.    Time 8    Period Weeks    Status New    Target Date 08/11/20      PT LONG TERM GOAL #5   Title Pt. will demonstrate independent ambulation community distances > 300 ft for usual ambulation mobility.    Time 8    Period Weeks    Status New    Target Date 08/11/20                 Plan - 06/21/20 1115    Clinical Impression Statement Patient is cautious with movements with anxiety & pain.  She was able to move left knee after she determines that it won't hurt as much as she anticipates.    Personal Factors and Comorbidities Comorbidity 3+    Comorbidities PMH: advanced Lt knee OA, Rt TKA 2019,HTN,OSA on CPAP    Examination-Activity Limitations Bend;Carry;Dressing;Lift;Stand;Stairs;Squat;Locomotion Level;Transfers;Sit;Sleep;Bed Mobility;Bathing;Hygiene/Grooming    Examination-Participation Restrictions Cleaning;Community Activity;Laundry;Shop;Meal Prep    Stability/Clinical Decision Making Evolving/Moderate complexity    Rehab Potential Good    PT Frequency 2x / week    PT Duration 8 weeks    PT Treatment/Interventions ADLs/Self Care Home  Management;Cryotherapy;Electrical Stimulation;Iontophoresis 4mg /ml Dexamethasone;Moist Heat;Traction;Parrafin;Gait training;Stair training;Therapeutic activities;Therapeutic exercise;Balance training;Neuromuscular  re-education;Manual techniques;Dry needling;Passive range of motion;Joint Manipulations;Spinal Manipulations;Taping;Vasopneumatic Device;Patient/family education    PT Next Visit Plan Review HEP, progress manual mobility for extension/flexion, balance as appropriate.    PT Home Exercise Plan [email or web address]    Consulted and Agree with Plan of Care Patient           Patient will benefit from skilled therapeutic intervention in order to improve the following deficits and impairments:  Abnormal gait, Decreased activity tolerance, Decreased balance, Decreased endurance, Decreased range of motion, Decreased mobility, Decreased strength, Hypomobility, Increased edema, Difficulty walking, Impaired flexibility, Increased muscle spasms, Increased fascial restricitons, Pain, Improper body mechanics, Postural dysfunction  Visit Diagnosis: Chronic pain of left knee  Muscle weakness (generalized)  Difficulty in walking, not elsewhere classified  Stiffness of left knee, not elsewhere classified  Localized edema  Chronic bilateral low back pain with left-sided sciatica  Other abnormalities of gait and mobility     Problem List Patient Active Problem List   Diagnosis Date Noted  . Status post total left knee replacement 06/06/2020  . Positive ANA (antinuclear antibody) 04/25/2020  . Primary osteoarthritis of left knee 04/14/2020  . Lumbar radiculopathy 06/26/2019  . Left-sided low back pain with left-sided sciatica 06/26/2019  . Thoracic spinal stenosis 04/18/2019  . Seasonal allergies 12/04/2018  . Allergic rhinoconjunctivitis 10/10/2018  . Moderate persistent asthma with acute exacerbation 10/10/2018  . Pruritus 10/10/2018  . Aortic valve sclerosis 10/06/2018  . Bilateral carotid  bruits 10/06/2018  . Abnormal stress ECG 10/06/2018  . Laboratory examination 10/06/2018  . Palpitations 10/06/2018  . Total knee replacement status 09/19/2017  . Sprain of anterior talofibular ligament of right ankle 12/13/2016    Jamey Reas PT, DPT 06/21/2020, 12:59 PM  [facility] [address] [place], [place], [address] Phone: [phone]   Fax:  [phone]  Name: DYNESHA WOOLEN MRN: [id number] Date of Birth: 12/03/1945

## 2020-06-23 ENCOUNTER — Telehealth: Payer: Self-pay

## 2020-06-23 ENCOUNTER — Other Ambulatory Visit: Payer: Self-pay

## 2020-06-23 ENCOUNTER — Encounter: Payer: Self-pay | Admitting: Adult Health

## 2020-06-23 ENCOUNTER — Ambulatory Visit (INDEPENDENT_AMBULATORY_CARE_PROVIDER_SITE_OTHER): Payer: Medicare Other | Admitting: Physical Therapy

## 2020-06-23 ENCOUNTER — Encounter: Payer: Self-pay | Admitting: Physical Therapy

## 2020-06-23 ENCOUNTER — Ambulatory Visit (INDEPENDENT_AMBULATORY_CARE_PROVIDER_SITE_OTHER): Payer: Medicare Other | Admitting: Adult Health

## 2020-06-23 VITALS — BP 122/70 | Ht 66.0 in | Wt 216.0 lb

## 2020-06-23 DIAGNOSIS — R2689 Other abnormalities of gait and mobility: Secondary | ICD-10-CM | POA: Diagnosis not present

## 2020-06-23 DIAGNOSIS — Z9989 Dependence on other enabling machines and devices: Secondary | ICD-10-CM

## 2020-06-23 DIAGNOSIS — M25662 Stiffness of left knee, not elsewhere classified: Secondary | ICD-10-CM | POA: Diagnosis not present

## 2020-06-23 DIAGNOSIS — G4733 Obstructive sleep apnea (adult) (pediatric): Secondary | ICD-10-CM | POA: Diagnosis not present

## 2020-06-23 DIAGNOSIS — G8929 Other chronic pain: Secondary | ICD-10-CM | POA: Diagnosis not present

## 2020-06-23 DIAGNOSIS — R600 Localized edema: Secondary | ICD-10-CM | POA: Diagnosis not present

## 2020-06-23 DIAGNOSIS — R262 Difficulty in walking, not elsewhere classified: Secondary | ICD-10-CM | POA: Diagnosis not present

## 2020-06-23 DIAGNOSIS — M5442 Lumbago with sciatica, left side: Secondary | ICD-10-CM | POA: Diagnosis not present

## 2020-06-23 DIAGNOSIS — M25562 Pain in left knee: Secondary | ICD-10-CM | POA: Diagnosis not present

## 2020-06-23 DIAGNOSIS — M6281 Muscle weakness (generalized): Secondary | ICD-10-CM | POA: Diagnosis not present

## 2020-06-23 MED ORDER — OXYCODONE-ACETAMINOPHEN 5-325 MG PO TABS
1.0000 | ORAL_TABLET | Freq: Two times a day (BID) | ORAL | 0 refills | Status: DC | PRN
Start: 2020-06-23 — End: 2020-06-30

## 2020-06-23 NOTE — Telephone Encounter (Signed)
Patient came in she is requesting rx refill [facility] oxycodone [phone]

## 2020-06-23 NOTE — Addendum Note (Signed)
Addended by: Azucena Cecil on: 06/23/2020 04:17 PM   Modules accepted: Orders

## 2020-06-23 NOTE — Telephone Encounter (Signed)
done

## 2020-06-23 NOTE — Progress Notes (Signed)
Order for pressure change [id number]- call pt about the straps- States she does not have equipment. May need new supplies. Order sent to [facility] via community msg. Confirmation received that the order transmitted was successful.

## 2020-06-23 NOTE — Patient Instructions (Signed)
Continue using CPAP nightly and greater than 4 hours each night Will increase pressure 9  If your symptoms worsen or you develop new symptoms please let us know.

## 2020-06-23 NOTE — Therapy (Signed)
[place], [place], [address] Phone: [phone]   Fax:  [phone]  Physical Therapy Treatment  Patient Details  Name: Rachel Crane MRN: [id number] Date of Birth: 13-Feb-1946 Referring Provider (PT): Dr. Erlinda Hong   Encounter Date: 06/23/2020   PT End of Session - 06/23/20 1008    Visit Number 3    Number of Visits 12    Date for PT Re-Evaluation 08/11/20    Authorization Type Medicare    Progress Note Due on Visit 10    PT Start Time 0928    PT Stop Time 1018    PT Time Calculation (min) 50 min    Equipment Utilized During Treatment Gait belt    Activity Tolerance Patient tolerated treatment well    Behavior During Therapy [facility] for tasks assessed/performed           Past Medical History:  Diagnosis Date  . Abnormal stress ECG 10/06/2018  . Aortic valve sclerosis 10/06/2018  . Arthritis   . Asthma   . Bilateral carotid bruits 10/06/2018  . Headache    H/O bad HA, since using CPAP- she has had improvement with that issue   . Hypertension   . Laboratory examination 10/06/2018  . OSA on CPAP    settting - 8, uses CPAP q night   . Pre-diabetes   . Shingles     Past Surgical History:  Procedure Laterality Date  . CHOLECYSTECTOMY  1998  . TOTAL KNEE ARTHROPLASTY Right 09/19/2017   Procedure: RIGHT TOTAL KNEE ARTHROPLASTY;  Surgeon: Leandrew Koyanagi, MD;  Location: [place];  Service: Orthopedics;  Laterality: Right;  . TOTAL KNEE ARTHROPLASTY Left 06/06/2020   Procedure: LEFT TOTAL KNEE ARTHROPLASTY;  Surgeon: Leandrew Koyanagi, MD;  Location: [place];  Service: Orthopedics;  Laterality: Left;  . TUBAL LIGATION      There were no vitals filed for this visit.   Subjective Assessment - 06/23/20 0932    Subjective doing well, can't get her tennis shoes on right now    Pertinent History PMH: lumbar radicupathy, advanced Lt knee OA, Rt TKA 2019,HTN,OSA on CPAP    Limitations Walking;House hold activities;Sitting;Lifting;Standing     Currently in Pain? Yes    Pain Score 3     Pain Location Knee    Pain Orientation Left    Pain Descriptors / Indicators Sore;Aching;Tightness    Pain Type Acute pain;Surgical pain    Pain Onset 1 to 4 weeks ago    Pain Frequency Constant    Aggravating Factors  standing, walking/bending    Pain Relieving Factors ice, meds              [facility] PT Assessment - 06/23/20 1002      Assessment   Medical Diagnosis Lt TKA    Referring Provider (PT) Dr. Erlinda Hong    Onset Date/Surgical Date 06/06/20      AROM   Left Knee Extension -2    Left Knee Flexion 110                         [facility] Adult PT Treatment/Exercise - 06/23/20 0938      Knee/Hip Exercises: Stretches   Knee: Self-Stretch to increase Flexion Left;5 reps;30 seconds    Knee: Self-Stretch Limitations sitting with low tension stretch knee flexion      Knee/Hip Exercises: Aerobic   Nustep Seat 9 level 5 for 8 min      Knee/Hip  Exercises: Seated   Long Arc Quad Left;3 sets;10 reps;Weights    Long Arc Quad Weight 3 lbs.      Vasopneumatic   Number Minutes Vasopneumatic  10 minutes    Vasopnuematic Location  Knee    Vasopneumatic Pressure Medium    Vasopneumatic Temperature  34      Manual Therapy   Manual therapy comments seated knee flexion, supine knee extension                    PT Short Term Goals - 06/16/20 1530      PT SHORT TERM GOAL #1   Title Patient will demonstrate independent use of home exercise program to maintain progress from in clinic treatments.    Time 3    Period Weeks    Status New    Target Date 07/07/20             PT Long Term Goals - 06/16/20 1527      PT LONG TERM GOAL #1   Title Patient will demonstrate/report pain at worst less than or equal to 2/10 to facilitate minimal limitation in daily activity secondary to pain symptoms.    Time 8    Period Weeks    Status New    Target Date 08/11/20      PT LONG TERM GOAL #2   Title Patient will  demonstrate independent use of home exercise program to facilitate ability to maintain/progress functional gains from skilled physical therapy services.    Time 8    Period Weeks    Status New    Target Date 08/11/20      PT LONG TERM GOAL #3   Title Patient will demonstrate Lt knee AROM 0-110 degrees to facilitate ability to perform transfers, sitting, ambulation, stair navigation s restriction due to mobility.    Time 8    Period Weeks    Status New    Target Date 08/11/20      PT LONG TERM GOAL #4   Title Patient will demonstrate Lt LE MMT 5/5 throughout to facilitate ability to perform usual standing, walking, stairs at PLOF s limitation due to symptoms.    Time 8    Period Weeks    Status New    Target Date 08/11/20      PT LONG TERM GOAL #5   Title Pt. will demonstrate independent ambulation community distances > 300 ft for usual ambulation mobility.    Time 8    Period Weeks    Status New    Target Date 08/11/20                 Plan - 06/23/20 1008    Clinical Impression Statement Pt demonstrating great improvements in AROM today and progressing well with PT.  Anticipate she will be ready to transition to [facility] in 1-2 weeks.    Personal Factors and Comorbidities Comorbidity 3+    Comorbidities PMH: advanced Lt knee OA, Rt TKA 2019,HTN,OSA on CPAP    Examination-Activity Limitations Bend;Carry;Dressing;Lift;Stand;Stairs;Squat;Locomotion Level;Transfers;Sit;Sleep;Bed Mobility;Bathing;Hygiene/Grooming    Examination-Participation Restrictions Cleaning;Community Activity;Laundry;Shop;Meal Prep    Stability/Clinical Decision Making Evolving/Moderate complexity    Rehab Potential Good    PT Frequency 2x / week    PT Duration 8 weeks    PT Treatment/Interventions ADLs/Self Care Home Management;Cryotherapy;Electrical Stimulation;Iontophoresis 4mg /ml Dexamethasone;Moist Heat;Traction;Parrafin;Gait training;Stair training;Therapeutic activities;Therapeutic exercise;Balance  training;Neuromuscular re-education;Manual techniques;Dry needling;Passive range of motion;Joint Manipulations;Spinal Manipulations;Taping;Vasopneumatic Device;Patient/family education    PT Next Visit Plan Review HEP, progress  manual mobility for extension/flexion, balance as appropriate; gait with SPC    PT Home Exercise Plan [id number]    Consulted and Agree with Plan of Care Patient           Patient will benefit from skilled therapeutic intervention in order to improve the following deficits and impairments:  Abnormal gait,Decreased activity tolerance,Decreased balance,Decreased endurance,Decreased range of motion,Decreased mobility,Decreased strength,Hypomobility,Increased edema,Difficulty walking,Impaired flexibility,Increased muscle spasms,Increased fascial restricitons,Pain,Improper body mechanics,Postural dysfunction  Visit Diagnosis: Chronic pain of left knee  Muscle weakness (generalized)  Difficulty in walking, not elsewhere classified  Stiffness of left knee, not elsewhere classified  Localized edema  Chronic bilateral low back pain with left-sided sciatica  Other abnormalities of gait and mobility     Problem List Patient Active Problem List   Diagnosis Date Noted  . Status post total left knee replacement 06/06/2020  . Positive ANA (antinuclear antibody) 04/25/2020  . Primary osteoarthritis of left knee 04/14/2020  . Lumbar radiculopathy 06/26/2019  . Left-sided low back pain with left-sided sciatica 06/26/2019  . Thoracic spinal stenosis 04/18/2019  . Seasonal allergies 12/04/2018  . Allergic rhinoconjunctivitis 10/10/2018  . Moderate persistent asthma with acute exacerbation 10/10/2018  . Pruritus 10/10/2018  . Aortic valve sclerosis 10/06/2018  . Bilateral carotid bruits 10/06/2018  . Abnormal stress ECG 10/06/2018  . Laboratory examination 10/06/2018  . Palpitations 10/06/2018  . Total knee replacement status 09/19/2017  . Sprain of anterior  talofibular ligament of right ankle 12/13/2016      Laureen Abrahams, PT, DPT 06/23/20 10:10 AM    [facility] Physical Therapy [address] [place], [place], [address] Phone: [phone]   Fax:  [phone]  Name: Rachel Crane MRN: [id number] Date of Birth: Feb 29, 1946

## 2020-06-23 NOTE — Progress Notes (Addendum)
PATIENT: Rachel Crane DOB: 1946/03/21  REASON FOR VISIT: follow up HISTORY FROM: patient  HISTORY OF PRESENT ILLNESS: Today 06/23/20:  Rachel Crane is a 74 year old female with a history of obstructive sleep apnea on CPAP.  Her download indicates that she uses her machine nightly for compliance of 100%.  She used her machine greater than 4 hours 26 days for compliance of 87%.  Her average usage is 6 hours and 26 minutes.  Residual AHI is 5.1 on 8 cm of water with EPR 2.  She does not have a significant leak.  She returns today for evaluation.  HISTORY 06/23/2019: I reviewed her CPAP compliance data from 05/23/2019 through 06/21/2019 which is a total of 30 days, during which time she used her machine every night with percent use days greater than 4 hours and 97%, indicating excellent compliance with an average usage of 8 hours and 0 minutes, residual AHI at goal at 2.7/h, leak on the low side with a 95th percentile at 2.2 L/min on a pressure of 8 cm with EPR of 2.  She reports doing well with her CPAP.  She has had back pain, it flared up recently in the past couple of months, she went to the hospital.  She had a lumbar spine MRI which showed moderate to severe foraminal narrowing at L5-S1, she had degenerative changes, she has seen Dr. Ernestina Patches.  Surgery was discussed but she decided against it.  She had an injection into the lower back about a month ago which did not help very much.  She wants to avoid surgery, she has been taking oxycodone sparingly and takes Tylenol every 8 hours.  She has an appointment with Dr. Ernestina Patches next week.  She had been using a walker, now she uses a single-point cane.  She had radiating pain to the left.  She denies any recurrent headaches.  The patient's allergies, current medications, family history, past medical history, past social history, past surgical history and problem list were reviewed and updated as appropriate.  REVIEW OF SYSTEMS: Out of a complete 14 system  review of symptoms, the patient complains only of the following symptoms, and all other reviewed systems are negative.  ESS 1  ALLERGIES: Allergies  Allergen Reactions  . Hydrocodone-Acetaminophen     Hives and nausea    HOME MEDICATIONS: Outpatient Medications Prior to Visit  Medication Sig Dispense Refill  . Albuterol Sulfate, sensor, (PROAIR DIGIHALER) 108 (90 Base) MCG/ACT AEPB Inhale 1-2 puffs into the lungs every 4 (four) hours as needed. (Patient taking differently: Inhale 1-2 puffs into the lungs every 4 (four) hours as needed (shortness of breath).) 3 each 0  . aspirin EC 81 MG tablet Take 1 tablet (81 mg total) by mouth 2 (two) times daily. To be taken twice daily x 6 weeks after surgery to prevent blood clots 84 tablet 0  . azelastine (ASTELIN) 0.1 % nasal spray Place 2 sprays into both nostrils 2 (two) times daily. 30 mL 5  . budesonide-formoterol (SYMBICORT) 80-4.5 MCG/ACT inhaler 2 Puffs 2 times daily 1 Inhaler 5  . chlorthalidone (HYGROTON) 25 MG tablet Take 25 mg by mouth daily.     . cholecalciferol (VITAMIN D) 25 MCG (1000 UNIT) tablet Take 1,000 Units by mouth daily.    . fluticasone (FLONASE) 50 MCG/ACT nasal spray Place 2 sprays into both nostrils daily. (Patient taking differently: Place 2 sprays into both nostrils daily as needed for rhinitis.) 16 g 5  . gabapentin (NEURONTIN) 300  MG capsule Take 1 capsule (300 mg total) by mouth 3 (three) times daily. 270 capsule 3  . loratadine (CLARITIN) 10 MG tablet Take 1 tablet (10 mg total) by mouth daily. 90 tablet 1  . losartan (COZAAR) 100 MG tablet Take 1 tablet (100 mg total) by mouth daily. 30 tablet 3  . ondansetron (ZOFRAN) 4 MG tablet Take 1 tablet (4 mg total) by mouth every 8 (eight) hours as needed for nausea or vomiting. 40 tablet 0  . oxyCODONE-acetaminophen (PERCOCET) 5-325 MG tablet Take 1-2 tablets by mouth every 8 (eight) hours as needed. 40 tablet 0  . Polyethyl Glycol-Propyl Glycol (SYSTANE) 0.4-0.3 % SOLN  Place 1 drop into both eyes every morning.    . pravastatin (PRAVACHOL) 40 MG tablet Take 40 mg by mouth daily.     . Zinc 50 MG CAPS Take 50 mg by mouth daily.     Marland Kitchen docusate sodium (COLACE) 100 MG capsule Take 1 capsule (100 mg total) by mouth daily as needed. 30 capsule 2  . latanoprost (XALATAN) 0.005 % ophthalmic solution Place 1 drop into both eyes at bedtime.   10  . methocarbamol (ROBAXIN) 500 MG tablet Take 1 tablet (500 mg total) by mouth 2 (two) times daily as needed. 20 tablet 0   No facility-administered medications prior to visit.    PAST MEDICAL HISTORY: Past Medical History:  Diagnosis Date  . Abnormal stress ECG 10/06/2018  . Aortic valve sclerosis 10/06/2018  . Arthritis   . Asthma   . Bilateral carotid bruits 10/06/2018  . Headache    H/O bad HA, since using CPAP- she has had improvement with that issue   . Hypertension   . Laboratory examination 10/06/2018  . OSA on CPAP    settting - 8, uses CPAP q night   . Pre-diabetes   . Shingles     PAST SURGICAL HISTORY: Past Surgical History:  Procedure Laterality Date  . CHOLECYSTECTOMY  1998  . TOTAL KNEE ARTHROPLASTY Right 09/19/2017   Procedure: RIGHT TOTAL KNEE ARTHROPLASTY;  Surgeon: Leandrew Koyanagi, MD;  Location: [place];  Service: Orthopedics;  Laterality: Right;  . TOTAL KNEE ARTHROPLASTY Left 06/06/2020   Procedure: LEFT TOTAL KNEE ARTHROPLASTY;  Surgeon: Leandrew Koyanagi, MD;  Location: [place];  Service: Orthopedics;  Laterality: Left;  . TUBAL LIGATION      FAMILY HISTORY: Family History  Problem Relation Age of Onset  . Breast cancer Sister   . Hypertension Mother   . Arthritis Mother     SOCIAL HISTORY: Social History   Socioeconomic History  . Marital status: Widowed    Spouse name: Not on file  . Number of children: 3  . Years of education: Not on file  . Highest education level: Not on file  Occupational History  . Not on file  Tobacco Use  . Smoking status: Never Smoker  . Smokeless tobacco:  Never Used  Vaping Use  . Vaping Use: Never used  Substance and Sexual Activity  . Alcohol use: Not Currently  . Drug use: No  . Sexual activity: Not on file  Other Topics Concern  . Not on file  Social History Narrative  . Not on file   Social Determinants of Health   Financial Resource Strain: Not on file  Food Insecurity: Not on file  Transportation Needs: Not on file  Physical Activity: Not on file  Stress: Not on file  Social Connections: Not on file  Intimate Partner Violence: Not  on file      PHYSICAL EXAM  Vitals:   06/23/20 1457  BP: 122/70  Weight: 216 lb (98 kg)  Height: 5\' 6"  (1.676 m)   Body mass index is 34.86 kg/m.  Generalized: Well developed, in no acute distress  Chest: Lungs clear to auscultation bilaterally  Neurological examination  Mentation: Alert oriented to time, place, history taking. Follows all commands speech and language fluent Cranial nerve II-XII: Extraocular movements were full, visual field were full on confrontational test Head turning and shoulder shrug  were normal and symmetric. Motor: The motor testing reveals 5 over 5 strength of all 4 extremities. Good symmetric motor tone is noted throughout.  Sensory: Sensory testing is intact to soft touch on all 4 extremities. No evidence of extinction is noted.  Gait and station: Gait is normal.    DIAGNOSTIC DATA (LABS, IMAGING, TESTING) - I reviewed patient records, labs, notes, testing and imaging myself where available.  Lab Results  Component Value Date   WBC 9.5 06/07/2020   HGB 9.5 (L) 06/07/2020   HCT 29.5 (L) 06/07/2020   MCV 85.0 06/07/2020   PLT 214 06/07/2020      Component Value Date/Time   NA 137 06/07/2020 0531   NA 144 11/10/2018 0850   K 3.6 06/07/2020 0531   CL 101 06/07/2020 0531   CO2 27 06/07/2020 0531   GLUCOSE 131 (H) 06/07/2020 0531   BUN 25 (H) 06/07/2020 0531   BUN 15 11/10/2018 0850   CREATININE 1.29 (H) 06/07/2020 0531   CALCIUM 8.9  06/07/2020 0531   PROT 7.2 06/02/2020 1508   PROT 7.2 11/10/2018 0850   ALBUMIN 3.9 06/02/2020 1508   ALBUMIN 4.6 11/10/2018 0850   AST 23 06/02/2020 1508   ALT 26 06/02/2020 1508   ALKPHOS 85 06/02/2020 1508   BILITOT 0.3 06/02/2020 1508   BILITOT 0.5 11/10/2018 0850   GFRNONAA 44 (L) 06/07/2020 0531   GFRAA 60 (L) 04/18/2019 0031   Lab Results  Component Value Date   CHOL 179 10/06/2018   HDL 50 10/06/2018   LDLCALC 114 (H) 10/06/2018   TRIG 76 10/06/2018   CHOLHDL 3.6 10/06/2018   No results found for: HGBA1C Lab Results  Component Value Date   VITAMINB12 721 06/23/2018   Lab Results  Component Value Date   TSH 0.980 10/23/2018      ASSESSMENT AND PLAN 74 y.o. year old female  has a past medical history of Abnormal stress ECG (10/06/2018), Aortic valve sclerosis (10/06/2018), Arthritis, Asthma, Bilateral carotid bruits (10/06/2018), Headache, Hypertension, Laboratory examination (10/06/2018), OSA on CPAP, Pre-diabetes, and Shingles. here with:  1. OSA on CPAP  - CPAP compliance excellent -Residual AHI is slightly elevated.  We will increase pressure to 9 cm of water - Encourage patient to use CPAP nightly and > 4 hours each night - F/U in 1 year or sooner if needed   I spent 25 minutes of face-to-face and non-face-to-face time with patient.  This included previsit chart review, lab review, study review, order entry, electronic health record documentation, patient education.  Ward Givens, MSN, NP-C 06/23/2020, 3:15 PM [facility] [address], [place], [place] [address] [phone]  I reviewed the above note and documentation by the Nurse Practitioner and agree with the history, exam, assessment and plan as outlined above. I was available for consultation. Star Age, MD, PhD [facility])

## 2020-06-24 ENCOUNTER — Telehealth: Payer: Self-pay | Admitting: Orthopaedic Surgery

## 2020-06-28 ENCOUNTER — Other Ambulatory Visit: Payer: Self-pay

## 2020-06-28 ENCOUNTER — Encounter: Payer: Self-pay | Admitting: Physical Therapy

## 2020-06-28 ENCOUNTER — Ambulatory Visit (INDEPENDENT_AMBULATORY_CARE_PROVIDER_SITE_OTHER): Payer: Medicare Other | Admitting: Physical Therapy

## 2020-06-28 DIAGNOSIS — R600 Localized edema: Secondary | ICD-10-CM | POA: Diagnosis not present

## 2020-06-28 DIAGNOSIS — G8929 Other chronic pain: Secondary | ICD-10-CM

## 2020-06-28 DIAGNOSIS — M25562 Pain in left knee: Secondary | ICD-10-CM

## 2020-06-28 DIAGNOSIS — R262 Difficulty in walking, not elsewhere classified: Secondary | ICD-10-CM

## 2020-06-28 DIAGNOSIS — M6281 Muscle weakness (generalized): Secondary | ICD-10-CM | POA: Diagnosis not present

## 2020-06-28 DIAGNOSIS — M25662 Stiffness of left knee, not elsewhere classified: Secondary | ICD-10-CM | POA: Diagnosis not present

## 2020-06-28 NOTE — Therapy (Signed)
[facility] [place], [place], [address] Phone: [phone]   Fax:  [phone]  Physical Therapy Treatment  Patient Details  Name: Rachel Crane MRN: [id number] Date of Birth: [birth year] Referring Provider (PT): Dr. Erlinda Hong   Encounter Date: 06/28/2020   PT End of Session - 06/28/20 1227    Visit Number 4    Number of Visits 12    Date for PT Re-Evaluation 08/11/20    Authorization Type Medicare    Progress Note Due on Visit 10    PT Start Time 1150    PT Stop Time 1232    PT Time Calculation (min) 42 min    Equipment Utilized During Treatment Gait belt    Activity Tolerance Patient tolerated treatment well    Behavior During Therapy [facility] for tasks assessed/performed           Past Medical History:  Diagnosis Date  . Abnormal stress ECG 10/06/2018  . Aortic valve sclerosis 10/06/2018  . Arthritis   . Asthma   . Bilateral carotid bruits 10/06/2018  . Headache    H/O bad HA, since using CPAP- she has had improvement with that issue   . Hypertension   . Laboratory examination 10/06/2018  . OSA on CPAP    settting - 8, uses CPAP q night   . Pre-diabetes   . Shingles     Past Surgical History:  Procedure Laterality Date  . CHOLECYSTECTOMY  1998  . TOTAL KNEE ARTHROPLASTY Right 09/19/2017   Procedure: RIGHT TOTAL KNEE ARTHROPLASTY;  Surgeon: Leandrew Koyanagi, MD;  Location: [place];  Service: Orthopedics;  Laterality: Right;  . TOTAL KNEE ARTHROPLASTY Left 06/06/2020   Procedure: LEFT TOTAL KNEE ARTHROPLASTY;  Surgeon: Leandrew Koyanagi, MD;  Location: [place];  Service: Orthopedics;  Laterality: Left;  . TUBAL LIGATION      There were no vitals filed for this visit.   Subjective Assessment - 06/28/20 1151    Subjective Lt knee is very stiff today, pain seems to mostly be resolved    Pertinent History PMH: lumbar radicupathy, advanced Lt knee OA, Rt TKA 2019,HTN,OSA on CPAP    Limitations Walking;House hold  activities;Sitting;Lifting;Standing    Currently in Pain? No/denies    Pain Onset 1 to 4 weeks ago              [facility] PT Assessment - 06/28/20 1210      Assessment   Medical Diagnosis Lt TKA    Referring Provider (PT) Dr. Erlinda Hong    Onset Date/Surgical Date 06/06/20      AROM   Left Knee Extension -2    Left Knee Flexion 120                         [facility] Adult PT Treatment/Exercise - 06/28/20 1153      Knee/Hip Exercises: Stretches   Knee: Self-Stretch to increase Flexion Left   10 x 20 sec hold   Knee: Self-Stretch Limitations sitting with low tension stretch knee flexion      Knee/Hip Exercises: Aerobic   Nustep Seat 9; L6 x 8 min      Knee/Hip Exercises: Standing   Heel Raises Both;20 reps    Hip Abduction Both;10 reps;Knee straight      Knee/Hip Exercises: Supine   Quad Sets Left;20 reps    Quad Sets Limitations Rt side activation needed for cues - difficulty isolating quad    Heel Slides AAROM;Left;2  sets;10 reps      Vasopneumatic   Number Minutes Vasopneumatic  10 minutes    Vasopnuematic Location  Knee    Vasopneumatic Pressure Medium    Vasopneumatic Temperature  34                    PT Short Term Goals - 06/16/20 1530      PT SHORT TERM GOAL #1   Title Patient will demonstrate independent use of home exercise program to maintain progress from in clinic treatments.    Time 3    Period Weeks    Status New    Target Date 07/07/20             PT Long Term Goals - 06/16/20 1527      PT LONG TERM GOAL #1   Title Patient will demonstrate/report pain at worst less than or equal to 2/10 to facilitate minimal limitation in daily activity secondary to pain symptoms.    Time 8    Period Weeks    Status New    Target Date 08/11/20      PT LONG TERM GOAL #2   Title Patient will demonstrate independent use of home exercise program to facilitate ability to maintain/progress functional gains from skilled physical therapy services.     Time 8    Period Weeks    Status New    Target Date 08/11/20      PT LONG TERM GOAL #3   Title Patient will demonstrate Lt knee AROM 0-110 degrees to facilitate ability to perform transfers, sitting, ambulation, stair navigation s restriction due to mobility.    Time 8    Period Weeks    Status New    Target Date 08/11/20      PT LONG TERM GOAL #4   Title Patient will demonstrate Lt LE MMT 5/5 throughout to facilitate ability to perform usual standing, walking, stairs at PLOF s limitation due to symptoms.    Time 8    Period Weeks    Status New    Target Date 08/11/20      PT LONG TERM GOAL #5   Title Pt. will demonstrate independent ambulation community distances > 300 ft for usual ambulation mobility.    Time 8    Period Weeks    Status New    Target Date 08/11/20                 Plan - 06/28/20 1227    Clinical Impression Statement Continues to progress with active flexion measured at 120 deg today, and only 2 deg away from 0 with extension.  Great progress at this time, with plan to progress strengtheing activities.    Personal Factors and Comorbidities Comorbidity 3+    Comorbidities PMH: advanced Lt knee OA, Rt TKA 2019,HTN,OSA on CPAP    Examination-Activity Limitations Bend;Carry;Dressing;Lift;Stand;Stairs;Squat;Locomotion Level;Transfers;Sit;Sleep;Bed Mobility;Bathing;Hygiene/Grooming    Examination-Participation Restrictions Cleaning;Community Activity;Laundry;Shop;Meal Prep    Stability/Clinical Decision Making Evolving/Moderate complexity    Rehab Potential Good    PT Frequency 2x / week    PT Duration 8 weeks    PT Treatment/Interventions ADLs/Self Care Home Management;Cryotherapy;Electrical Stimulation;Iontophoresis 4mg /ml Dexamethasone;Moist Heat;Traction;Parrafin;Gait training;Stair training;Therapeutic activities;Therapeutic exercise;Balance training;Neuromuscular re-education;Manual techniques;Dry needling;Passive range of motion;Joint  Manipulations;Spinal Manipulations;Taping;Vasopneumatic Device;Patient/family education    PT Next Visit Plan gait with SPC/no AD, work on last few deg ext, maintain flexion, strengthening focus    PT [place] and Agree with Plan of  Care Patient           Patient will benefit from skilled therapeutic intervention in order to improve the following deficits and impairments:  Abnormal gait,Decreased activity tolerance,Decreased balance,Decreased endurance,Decreased range of motion,Decreased mobility,Decreased strength,Hypomobility,Increased edema,Difficulty walking,Impaired flexibility,Increased muscle spasms,Increased fascial restricitons,Pain,Improper body mechanics,Postural dysfunction  Visit Diagnosis: Chronic pain of left knee  Muscle weakness (generalized)  Difficulty in walking, not elsewhere classified  Stiffness of left knee, not elsewhere classified  Localized edema     Problem List Patient Active Problem List   Diagnosis Date Noted  . Status post total left knee replacement 06/06/2020  . Positive ANA (antinuclear antibody) 04/25/2020  . Primary osteoarthritis of left knee 04/14/2020  . Lumbar radiculopathy 06/26/2019  . Left-sided low back pain with left-sided sciatica 06/26/2019  . Thoracic spinal stenosis 04/18/2019  . Seasonal allergies 12/04/2018  . Allergic rhinoconjunctivitis 10/10/2018  . Moderate persistent asthma with acute exacerbation 10/10/2018  . Pruritus 10/10/2018  . Aortic valve sclerosis 10/06/2018  . Bilateral carotid bruits 10/06/2018  . Abnormal stress ECG 10/06/2018  . Laboratory examination 10/06/2018  . Palpitations 10/06/2018  . Total knee replacement status 09/19/2017  . Sprain of anterior talofibular ligament of right ankle 12/13/2016      Laureen Abrahams, PT, DPT 06/28/20 12:29 PM    [facility] [address] [place], [place], [address] Phone: [phone]    Fax:  [phone]  Name: Rachel Crane MRN: [id number] Date of Birth: June 14, 1946

## 2020-06-30 ENCOUNTER — Telehealth: Payer: Self-pay | Admitting: Orthopaedic Surgery

## 2020-06-30 ENCOUNTER — Other Ambulatory Visit: Payer: Self-pay

## 2020-06-30 ENCOUNTER — Ambulatory Visit (INDEPENDENT_AMBULATORY_CARE_PROVIDER_SITE_OTHER): Payer: Medicare Other | Admitting: Rehabilitative and Restorative Service Providers"

## 2020-06-30 ENCOUNTER — Other Ambulatory Visit: Payer: Self-pay | Admitting: Physician Assistant

## 2020-06-30 DIAGNOSIS — M25662 Stiffness of left knee, not elsewhere classified: Secondary | ICD-10-CM | POA: Diagnosis not present

## 2020-06-30 DIAGNOSIS — M6281 Muscle weakness (generalized): Secondary | ICD-10-CM | POA: Diagnosis not present

## 2020-06-30 DIAGNOSIS — M25562 Pain in left knee: Secondary | ICD-10-CM

## 2020-06-30 DIAGNOSIS — G8929 Other chronic pain: Secondary | ICD-10-CM

## 2020-06-30 DIAGNOSIS — R262 Difficulty in walking, not elsewhere classified: Secondary | ICD-10-CM | POA: Diagnosis not present

## 2020-06-30 DIAGNOSIS — R600 Localized edema: Secondary | ICD-10-CM

## 2020-06-30 MED ORDER — OXYCODONE-ACETAMINOPHEN 5-325 MG PO TABS
1.0000 | ORAL_TABLET | Freq: Two times a day (BID) | ORAL | 0 refills | Status: DC | PRN
Start: 2020-06-30 — End: 2020-07-16

## 2020-06-30 MED ORDER — METHOCARBAMOL 500 MG PO TABS: 500.0000 mg | ORAL_TABLET | Freq: Two times a day (BID) | ORAL | 0 refills | Status: DC | PRN

## 2020-06-30 NOTE — Telephone Encounter (Signed)
Pt came into the office requesting something to help her sleep due to the pain. Pt is stating it doesn't have to be oxy she just needed something to help her get some rest

## 2020-06-30 NOTE — Therapy (Signed)
[facility] [address] [place], [place], [address] Phone: [phone]   Fax:  [phone]  Physical Therapy Treatment  Patient Details  Name: Rachel Crane MRN: [id number] Date of Birth: 28-Jan-1946 Referring Provider (PT): Dr. Erlinda Hong   Encounter Date: 06/30/2020   PT End of Session - 06/30/20 1152    Visit Number 5    Number of Visits 12    Date for PT Re-Evaluation 08/11/20    Authorization Type Medicare    Progress Note Due on Visit 10    PT Start Time 1151    PT Stop Time 1230    PT Time Calculation (min) 39 min    Equipment Utilized During Treatment Gait belt    Activity Tolerance Patient tolerated treatment well    Behavior During Therapy [facility] for tasks assessed/performed           Past Medical History:  Diagnosis Date  . Abnormal stress ECG 10/06/2018  . Aortic valve sclerosis 10/06/2018  . Arthritis   . Asthma   . Bilateral carotid bruits 10/06/2018  . Headache    H/O bad HA, since using CPAP- she has had improvement with that issue   . Hypertension   . Laboratory examination 10/06/2018  . OSA on CPAP    settting - 8, uses CPAP q night   . Pre-diabetes   . Shingles     Past Surgical History:  Procedure Laterality Date  . CHOLECYSTECTOMY  1998  . TOTAL KNEE ARTHROPLASTY Right 09/19/2017   Procedure: RIGHT TOTAL KNEE ARTHROPLASTY;  Surgeon: Leandrew Koyanagi, MD;  Location: [place];  Service: Orthopedics;  Laterality: Right;  . TOTAL KNEE ARTHROPLASTY Left 06/06/2020   Procedure: LEFT TOTAL KNEE ARTHROPLASTY;  Surgeon: Leandrew Koyanagi, MD;  Location: [place];  Service: Orthopedics;  Laterality: Left;  . TUBAL LIGATION      There were no vitals filed for this visit.   Subjective Assessment - 06/30/20 1200    Subjective Pt. indicated stiffness and reported wanting to talk to MD about pain medications.    Pertinent History PMH: lumbar radicupathy, advanced Lt knee OA, Rt TKA 2019,HTN,OSA on CPAP    Limitations Walking;House hold  activities;Sitting;Lifting;Standing    Currently in Pain? Yes    Pain Score --   a little bit   Pain Location Knee    Pain Orientation Left    Pain Onset 1 to 4 weeks ago    Pain Frequency Constant    Aggravating Factors  constant insidious, difficulty sleeping    Pain Relieving Factors medicine                             [facility] Adult PT Treatment/Exercise - 06/30/20 0001      Therapeutic Activites    Therapeutic Activities Other Therapeutic Activities    Other Therapeutic Activities SPC ambulation training for distance for household ambulation 150 ft, sit to stand to sit transfers from 20 inch chair for functional progressive mobility x 10, x 5      Neuro Re-ed    Neuro Re-ed Details  retro step Lt LE posterior 20x,heel raises/toe lifts 20 x      Knee/Hip Exercises: Stretches   Gastroc Stretch 3 reps;30 seconds   incline board     Knee/Hip Exercises: Aerobic   Recumbent Bike Lvl 2 6 mins full rev, seat      Knee/Hip Exercises: Seated   Long Arc Quad Left;3 sets;10  reps    Long Arc Quad Weight 5 lbs.                    PT Short Term Goals - 06/30/20 1202      PT SHORT TERM GOAL #1   Title Patient will demonstrate independent use of home exercise program to maintain progress from in clinic treatments.    Baseline cues still required    Time 3    Period Weeks    Status Partially Met    Target Date 07/07/20             PT Long Term Goals - 06/16/20 1527      PT LONG TERM GOAL #1   Title Patient will demonstrate/report pain at worst less than or equal to 2/10 to facilitate minimal limitation in daily activity secondary to pain symptoms.    Time 8    Period Weeks    Status New    Target Date 08/11/20      PT LONG TERM GOAL #2   Title Patient will demonstrate independent use of home exercise program to facilitate ability to maintain/progress functional gains from skilled physical therapy services.    Time 8    Period Weeks    Status  New    Target Date 08/11/20      PT LONG TERM GOAL #3   Title Patient will demonstrate Lt knee AROM 0-110 degrees to facilitate ability to perform transfers, sitting, ambulation, stair navigation s restriction due to mobility.    Time 8    Period Weeks    Status New    Target Date 08/11/20      PT LONG TERM GOAL #4   Title Patient will demonstrate Lt LE MMT 5/5 throughout to facilitate ability to perform usual standing, walking, stairs at PLOF s limitation due to symptoms.    Time 8    Period Weeks    Status New    Target Date 08/11/20      PT LONG TERM GOAL #5   Title Pt. will demonstrate independent ambulation community distances > 300 ft for usual ambulation mobility.    Time 8    Period Weeks    Status New    Target Date 08/11/20                 Plan - 06/30/20 1208    Clinical Impression Statement Some intevention today limited by skirt to prevent loss of personal privacy.  Pt. may benefit from increased focus on static and dynamic balance control to facilitate transitioning from [facility].  Extension tightness noted both active and passively while flexion continued to improve.  Pt. to get [facility] for home use, will continue to improve use in clinc.    Personal Factors and Comorbidities Comorbidity 3+    Comorbidities PMH: advanced Lt knee OA, Rt TKA 2019,HTN,OSA on CPAP    Examination-Activity Limitations Bend;Carry;Dressing;Lift;Stand;Stairs;Squat;Locomotion Level;Transfers;Sit;Sleep;Bed Mobility;Bathing;Hygiene/Grooming    Examination-Participation Restrictions Cleaning;Community Activity;Laundry;Shop;Meal Prep    Stability/Clinical Decision Making Evolving/Moderate complexity    Rehab Potential Good    PT Frequency 2x / week    PT Duration 8 weeks    PT Treatment/Interventions ADLs/Self Care Home Management;Cryotherapy;Electrical Stimulation;Iontophoresis 14m/ml Dexamethasone;Moist Heat;Traction;Parrafin;Gait training;Stair training;Therapeutic activities;Therapeutic  exercise;Balance training;Neuromuscular re-education;Manual techniques;Dry needling;Passive range of motion;Joint Manipulations;Spinal Manipulations;Taping;Vasopneumatic Device;Patient/family education    PT Next Visit Plan Continued strengthening, static/dynamic balance c progression to compliant surfaces as appropriate.    PT [place]  and Agree with Plan of Care Patient           Patient will benefit from skilled therapeutic intervention in order to improve the following deficits and impairments:  Abnormal gait,Decreased activity tolerance,Decreased balance,Decreased endurance,Decreased range of motion,Decreased mobility,Decreased strength,Hypomobility,Increased edema,Difficulty walking,Impaired flexibility,Increased muscle spasms,Increased fascial restricitons,Pain,Improper body mechanics,Postural dysfunction  Visit Diagnosis: Chronic pain of left knee  Muscle weakness (generalized)  Difficulty in walking, not elsewhere classified  Stiffness of left knee, not elsewhere classified  Localized edema     Problem List Patient Active Problem List   Diagnosis Date Noted  . Status post total left knee replacement 06/06/2020  . Positive ANA (antinuclear antibody) 04/25/2020  . Primary osteoarthritis of left knee 04/14/2020  . Lumbar radiculopathy 06/26/2019  . Left-sided low back pain with left-sided sciatica 06/26/2019  . Thoracic spinal stenosis 04/18/2019  . Seasonal allergies 12/04/2018  . Allergic rhinoconjunctivitis 10/10/2018  . Moderate persistent asthma with acute exacerbation 10/10/2018  . Pruritus 10/10/2018  . Aortic valve sclerosis 10/06/2018  . Bilateral carotid bruits 10/06/2018  . Abnormal stress ECG 10/06/2018  . Laboratory examination 10/06/2018  . Palpitations 10/06/2018  . Total knee replacement status 09/19/2017  . Sprain of anterior talofibular ligament of right ankle 12/13/2016    Scot Jun, PT, DPT, OCS,  ATC 06/30/20  12:29 PM    [facility] [address] [place], [place], [address] Phone: [phone]   Fax:  [phone]  Name: Rachel Crane MRN: [id number] Date of Birth: [birth year]

## 2020-06-30 NOTE — Telephone Encounter (Signed)
Pt is s/p a left total knee 06/06/20 please see message below and advise.

## 2020-06-30 NOTE — Telephone Encounter (Signed)
I called and lm on vm to advise of message below. To call with any questions.  

## 2020-06-30 NOTE — Telephone Encounter (Signed)
Called [facility] spoke with Ivin Booty verified that the patient's disability information was received and sent for processing. Gave patient a copy of the printed information.

## 2020-06-30 NOTE — Telephone Encounter (Signed)
E 

## 2020-06-30 NOTE — Telephone Encounter (Signed)
I refilled percocet for if she cannot sleep due to pain and I also sent in robaxin.  Have her try melatonin otc.  If does not work, she can take a benadryl.

## 2020-07-01 ENCOUNTER — Telehealth: Payer: Self-pay | Admitting: Orthopaedic Surgery

## 2020-07-01 ENCOUNTER — Telehealth: Payer: Self-pay

## 2020-07-01 NOTE — Telephone Encounter (Signed)
You just called in medication for this pt yesterday. S/p total knee 06/06/20 pt states that pain medication and muscle relaxer are not touching her pain. Please advise.

## 2020-07-01 NOTE — Telephone Encounter (Signed)
Abby with [facility] called stating that patient had asked them to call our office to let us know that her medications are not helping, especially the muscle relaxer.  Patient would like to have medication changed?  Please advise.  Thank you.

## 2020-07-01 NOTE — Telephone Encounter (Signed)
Message already sent to PA to address. This is duplicate

## 2020-07-01 NOTE — Telephone Encounter (Signed)
Patient called requesting to up the dosage or give her different medication. Patient state muscle relaxer and oxycodone 5 is not touching her pains. Patient states she is in severe pain. Please call patient at [phone]. Please send medication to pharmacy on file.

## 2020-07-04 ENCOUNTER — Other Ambulatory Visit: Payer: Self-pay | Admitting: Physician Assistant

## 2020-07-04 ENCOUNTER — Encounter: Payer: Self-pay | Admitting: Physical Therapy

## 2020-07-04 ENCOUNTER — Other Ambulatory Visit: Payer: Self-pay

## 2020-07-04 ENCOUNTER — Ambulatory Visit (INDEPENDENT_AMBULATORY_CARE_PROVIDER_SITE_OTHER): Payer: Medicare Other | Admitting: Physical Therapy

## 2020-07-04 DIAGNOSIS — G8929 Other chronic pain: Secondary | ICD-10-CM | POA: Diagnosis not present

## 2020-07-04 DIAGNOSIS — M25562 Pain in left knee: Secondary | ICD-10-CM

## 2020-07-04 DIAGNOSIS — M6281 Muscle weakness (generalized): Secondary | ICD-10-CM

## 2020-07-04 DIAGNOSIS — R262 Difficulty in walking, not elsewhere classified: Secondary | ICD-10-CM | POA: Diagnosis not present

## 2020-07-04 DIAGNOSIS — R600 Localized edema: Secondary | ICD-10-CM

## 2020-07-04 DIAGNOSIS — M25662 Stiffness of left knee, not elsewhere classified: Secondary | ICD-10-CM | POA: Diagnosis not present

## 2020-07-04 MED ORDER — HYDROMORPHONE HCL 2 MG PO TABS: 2.0000 mg | ORAL_TABLET | Freq: Three times a day (TID) | ORAL | 0 refills | Status: DC | PRN

## 2020-07-04 MED ORDER — HYDROMORPHONE HCL 2 MG PO TABS
2.0000 mg | ORAL_TABLET | Freq: Three times a day (TID) | ORAL | 0 refills | Status: DC | PRN
Start: 2020-07-04 — End: 2020-07-04

## 2020-07-04 NOTE — Telephone Encounter (Signed)
I called and lm on vm to advise pt of your recommendation for 800 mg Ibuprofen TID PRN ice and rest. Pt walked into office today please see message below and advise.

## 2020-07-04 NOTE — Therapy (Signed)
[facility] [address] [place], [place], [address] Phone: [phone]   Fax:  [phone]  Physical Therapy Treatment  Patient Details  Name: Rachel Crane MRN: [id number] Date of Birth: Oct 18, 1945 Referring Provider (PT): Dr. Erlinda Hong   Encounter Date: 07/04/2020   PT End of Session - 07/04/20 1104    Visit Number 6    Number of Visits 12    Date for PT Re-Evaluation 08/11/20    Authorization Type Medicare    Progress Note Due on Visit 10    PT Start Time 1016    PT Stop Time 1104    PT Time Calculation (min) 48 min    Equipment Utilized During Treatment Gait belt    Activity Tolerance Patient tolerated treatment well    Behavior During Therapy [facility] for tasks assessed/performed           Past Medical History:  Diagnosis Date  . Abnormal stress ECG 10/06/2018  . Aortic valve sclerosis 10/06/2018  . Arthritis   . Asthma   . Bilateral carotid bruits 10/06/2018  . Headache    H/O bad HA, since using CPAP- she has had improvement with that issue   . Hypertension   . Laboratory examination 10/06/2018  . OSA on CPAP    settting - 8, uses CPAP q night   . Pre-diabetes   . Shingles     Past Surgical History:  Procedure Laterality Date  . CHOLECYSTECTOMY  1998  . TOTAL KNEE ARTHROPLASTY Right 09/19/2017   Procedure: RIGHT TOTAL KNEE ARTHROPLASTY;  Surgeon: Leandrew Koyanagi, MD;  Location: [place];  Service: Orthopedics;  Laterality: Right;  . TOTAL KNEE ARTHROPLASTY Left 06/06/2020   Procedure: LEFT TOTAL KNEE ARTHROPLASTY;  Surgeon: Leandrew Koyanagi, MD;  Location: [place];  Service: Orthopedics;  Laterality: Left;  . TUBAL LIGATION      There were no vitals filed for this visit.   Subjective Assessment - 07/04/20 1103    Subjective still having pain at night; "I don't want you to take it easy on me."    Pertinent History PMH: lumbar radicupathy, advanced Lt knee OA, Rt TKA 2019,HTN,OSA on CPAP    Limitations Walking;House hold  activities;Sitting;Lifting;Standing    Currently in Pain? Yes    Pain Score --   did not rate   Pain Location Knee    Pain Orientation Left    Pain Descriptors / Indicators Aching;Tightness;Sore    Pain Type Acute pain;Surgical pain    Pain Onset 1 to 4 weeks ago    Pain Frequency Constant    Aggravating Factors  constant, worse at night    Pain Relieving Factors meds                             [facility] Adult PT Treatment/Exercise - 07/04/20 1016      Knee/Hip Exercises: Aerobic   Recumbent Bike 8 min; L3 full rev, seat 5      Knee/Hip Exercises: Machines for Strengthening   Cybex Knee Extension 5# 3x10; LLE only - occasional min A from RLE    Cybex Knee Flexion 15# 3x10; LLE only - occasional min A from RLE      Knee/Hip Exercises: Seated   Sit to Sand 10 reps;without UE support;2 sets      Vasopneumatic   Number Minutes Vasopneumatic  10 minutes    Vasopnuematic Location  Knee    Vasopneumatic Pressure Low  Vasopneumatic Temperature  34                    PT Short Term Goals - 06/30/20 1202      PT SHORT TERM GOAL #1   Title Patient will demonstrate independent use of home exercise program to maintain progress from in clinic treatments.    Baseline cues still required    Time 3    Period Weeks    Status Partially Met    Target Date 07/07/20             PT Long Term Goals - 07/04/20 1105      PT LONG TERM GOAL #1   Title Patient will demonstrate/report pain at worst less than or equal to 2/10 to facilitate minimal limitation in daily activity secondary to pain symptoms.    Time 8    Period Weeks    Status On-going    Target Date 08/12/19      PT LONG TERM GOAL #2   Title Patient will demonstrate independent use of home exercise program to facilitate ability to maintain/progress functional gains from skilled physical therapy services.    Time 8    Period Weeks    Status On-going      PT LONG TERM GOAL #3   Title Patient will  demonstrate Lt knee AROM 0-110 degrees to facilitate ability to perform transfers, sitting, ambulation, stair navigation s restriction due to mobility.    Time 8    Period Weeks    Status On-going      PT LONG TERM GOAL #4   Title Patient will demonstrate Lt LE MMT 5/5 throughout to facilitate ability to perform usual standing, walking, stairs at PLOF s limitation due to symptoms.    Time 8    Period Weeks    Status On-going      PT LONG TERM GOAL #5   Title Pt. will demonstrate independent ambulation community distances > 300 ft for usual ambulation mobility.    Time 8    Period Weeks    Status On-going                 Plan - 07/04/20 1105    Clinical Impression Statement Pt tolerated session well today with focus on strengthening exercises as ROM looks excellent.  Amb without device in clinic safely without LOB or concerns for falling.  Will continue to benefit from PT to maximize function.    Personal Factors and Comorbidities Comorbidity 3+    Comorbidities PMH: advanced Lt knee OA, Rt TKA 2019,HTN,OSA on CPAP    Examination-Activity Limitations Bend;Carry;Dressing;Lift;Stand;Stairs;Squat;Locomotion Level;Transfers;Sit;Sleep;Bed Mobility;Bathing;Hygiene/Grooming    Examination-Participation Restrictions Cleaning;Community Activity;Laundry;Shop;Meal Prep    Stability/Clinical Decision Making Evolving/Moderate complexity    Rehab Potential Good    PT Frequency 2x / week    PT Duration 8 weeks    PT Treatment/Interventions ADLs/Self Care Home Management;Cryotherapy;Electrical Stimulation;Iontophoresis 65m/ml Dexamethasone;Moist Heat;Traction;Parrafin;Gait training;Stair training;Therapeutic activities;Therapeutic exercise;Balance training;Neuromuscular re-education;Manual techniques;Dry needling;Passive range of motion;Joint Manipulations;Spinal Manipulations;Taping;Vasopneumatic Device;Patient/family education    PT Next Visit Plan Continued strengthening, static/dynamic  balance c progression to compliant surfaces as appropriate.; get ROM measurement    PT Home Exercise Plan [id number]    Consulted and Agree with Plan of Care Patient           Patient will benefit from skilled therapeutic intervention in order to improve the following deficits and impairments:  Abnormal gait,Decreased activity tolerance,Decreased balance,Decreased endurance,Decreased range of motion,Decreased mobility,Decreased strength,Hypomobility,Increased edema,Difficulty walking,Impaired flexibility,Increased  muscle spasms,Increased fascial restricitons,Pain,Improper body mechanics,Postural dysfunction  Visit Diagnosis: Chronic pain of left knee  Muscle weakness (generalized)  Difficulty in walking, not elsewhere classified  Stiffness of left knee, not elsewhere classified  Localized edema     Problem List Patient Active Problem List   Diagnosis Date Noted  . Status post total left knee replacement 06/06/2020  . Positive ANA (antinuclear antibody) 04/25/2020  . Primary osteoarthritis of left knee 04/14/2020  . Lumbar radiculopathy 06/26/2019  . Left-sided low back pain with left-sided sciatica 06/26/2019  . Thoracic spinal stenosis 04/18/2019  . Seasonal allergies 12/04/2018  . Allergic rhinoconjunctivitis 10/10/2018  . Moderate persistent asthma with acute exacerbation 10/10/2018  . Pruritus 10/10/2018  . Aortic valve sclerosis 10/06/2018  . Bilateral carotid bruits 10/06/2018  . Abnormal stress ECG 10/06/2018  . Laboratory examination 10/06/2018  . Palpitations 10/06/2018  . Total knee replacement status 09/19/2017  . Sprain of anterior talofibular ligament of right ankle 12/13/2016     Laureen Abrahams, PT, DPT 07/04/20 11:07 AM    [facility] [address] [place], [place], [address] Phone: [phone]   Fax:  [phone]  Name: Rachel Crane MRN: [id number] Date of Birth: 1945-08-05

## 2020-07-04 NOTE — Telephone Encounter (Signed)
Have her add 800mg  ibuprofen tid prn.  I am afraid to start her on toradol for a week due to slighly decreased renal function on previous labs.  Continue ice and elevation.  Back off activity as much as possible

## 2020-07-04 NOTE — Telephone Encounter (Signed)
I called and lm on vm for pt. To call with any questions.

## 2020-07-04 NOTE — Telephone Encounter (Signed)
I called and left very detailed message on vm to advise the pt of order below. To call with any questions.

## 2020-07-04 NOTE — Telephone Encounter (Signed)
Pt came into the office stating Dr. Erlinda Hong told her he was going to change her medication. Pt is saying that she has iced and limited activity but the medication she has still isn't working

## 2020-07-04 NOTE — Telephone Encounter (Signed)
I sent in dilaudid.  Make sure she stops the other pain meds while she is taking this

## 2020-07-06 ENCOUNTER — Encounter: Payer: Medicare Other | Admitting: Rehabilitative and Restorative Service Providers"

## 2020-07-07 ENCOUNTER — Telehealth: Payer: Self-pay | Admitting: *Deleted

## 2020-07-07 NOTE — Telephone Encounter (Signed)
Ortho bundle 30 day call completed. °

## 2020-07-10 DIAGNOSIS — Z471 Aftercare following joint replacement surgery: Secondary | ICD-10-CM | POA: Diagnosis not present

## 2020-07-11 ENCOUNTER — Other Ambulatory Visit: Payer: Self-pay | Admitting: Physician Assistant

## 2020-07-11 ENCOUNTER — Telehealth: Payer: Self-pay

## 2020-07-11 MED ORDER — HYDROMORPHONE HCL 2 MG PO TABS: 2.0000 mg | ORAL_TABLET | Freq: Three times a day (TID) | ORAL | 0 refills | Status: DC | PRN

## 2020-07-11 NOTE — Telephone Encounter (Signed)
Patient called she is requesting rx refill for hydromorphone CB:[phone]

## 2020-07-11 NOTE — Telephone Encounter (Signed)
Please advise 

## 2020-07-11 NOTE — Telephone Encounter (Signed)
I called patient and advised. 

## 2020-07-11 NOTE — Telephone Encounter (Signed)
Sent in.  Needs to be last rx of dilaudid so she will need back down to oxycodone after this rx

## 2020-07-12 ENCOUNTER — Encounter: Payer: Medicare Other | Admitting: Rehabilitative and Restorative Service Providers"

## 2020-07-14 ENCOUNTER — Other Ambulatory Visit: Payer: Self-pay

## 2020-07-14 ENCOUNTER — Encounter: Payer: Self-pay | Admitting: Rehabilitative and Restorative Service Providers"

## 2020-07-14 ENCOUNTER — Telehealth: Payer: Self-pay | Admitting: Orthopaedic Surgery

## 2020-07-14 ENCOUNTER — Ambulatory Visit (INDEPENDENT_AMBULATORY_CARE_PROVIDER_SITE_OTHER): Payer: Medicare Other | Admitting: Rehabilitative and Restorative Service Providers"

## 2020-07-14 DIAGNOSIS — G8929 Other chronic pain: Secondary | ICD-10-CM

## 2020-07-14 DIAGNOSIS — M25562 Pain in left knee: Secondary | ICD-10-CM

## 2020-07-14 DIAGNOSIS — M6281 Muscle weakness (generalized): Secondary | ICD-10-CM | POA: Diagnosis not present

## 2020-07-14 DIAGNOSIS — R600 Localized edema: Secondary | ICD-10-CM | POA: Diagnosis not present

## 2020-07-14 DIAGNOSIS — R262 Difficulty in walking, not elsewhere classified: Secondary | ICD-10-CM | POA: Diagnosis not present

## 2020-07-14 DIAGNOSIS — M25662 Stiffness of left knee, not elsewhere classified: Secondary | ICD-10-CM | POA: Diagnosis not present

## 2020-07-14 NOTE — Therapy (Signed)
[facility] [address] [place], [place], [address] Phone: [phone]   Fax:  [phone]  Physical Therapy Treatment  Patient Details  Name: Rachel Crane MRN: [id number] Date of Birth: 21-Feb-1946 Referring Provider (PT): Dr. Erlinda Hong   Encounter Date: 07/14/2020   PT End of Session - 07/14/20 1436    Visit Number 7    Number of Visits 12    Date for PT Re-Evaluation 08/11/20    Authorization Type Medicare    Progress Note Due on Visit 10    PT Start Time 1430    PT Stop Time 1515    PT Time Calculation (min) 45 min    Equipment Utilized During Treatment --    Activity Tolerance Patient tolerated treatment well    Behavior During Therapy [facility] for tasks assessed/performed           Past Medical History:  Diagnosis Date  . Abnormal stress ECG 10/06/2018  . Aortic valve sclerosis 10/06/2018  . Arthritis   . Asthma   . Bilateral carotid bruits 10/06/2018  . Headache    H/O bad HA, since using CPAP- she has had improvement with that issue   . Hypertension   . Laboratory examination 10/06/2018  . OSA on CPAP    settting - 8, uses CPAP q night   . Pre-diabetes   . Shingles     Past Surgical History:  Procedure Laterality Date  . CHOLECYSTECTOMY  1998  . TOTAL KNEE ARTHROPLASTY Right 09/19/2017   Procedure: RIGHT TOTAL KNEE ARTHROPLASTY;  Surgeon: Leandrew Koyanagi, MD;  Location: [place];  Service: Orthopedics;  Laterality: Right;  . TOTAL KNEE ARTHROPLASTY Left 06/06/2020   Procedure: LEFT TOTAL KNEE ARTHROPLASTY;  Surgeon: Leandrew Koyanagi, MD;  Location: [place];  Service: Orthopedics;  Laterality: Left;  . TUBAL LIGATION      There were no vitals filed for this visit.   Subjective Assessment - 07/14/20 1434    Subjective Pt. indicated feeling stiff, "thinking the weather".  Pt. indicated she didn't have the energy to come earlier this week.    Pertinent History PMH: lumbar radicupathy, advanced Lt knee OA, Rt TKA 2019,HTN,OSA on CPAP     Limitations Walking;House hold activities;Sitting;Lifting;Standing    Currently in Pain? Yes    Pain Score --   stiffness/tightness, didn't report a number   Pain Location Knee    Pain Orientation Left    Pain Descriptors / Indicators Tightness;Aching    Pain Onset 1 to 4 weeks ago    Aggravating Factors  "weather", constant at times              [facility] PT Assessment - 07/14/20 0001      Assessment   Medical Diagnosis Lt TKA    Referring Provider (PT) Dr. Erlinda Hong    Onset Date/Surgical Date 06/06/20      AROM   Left Knee Extension -6   observed in SLR attempt   Left Knee Flexion 120                         [facility] Adult PT Treatment/Exercise - 07/14/20 0001      Neuro Re-ed    Neuro Re-ed Details  heel/toe raises 20x, tandem stance 1 min x 1 bilateral      Knee/Hip Exercises: Stretches   Gastroc Stretch 30 seconds;3 reps;Both   incilne board   Other Knee/Hip Stretches TKE stretch supine 10 mins c vaso  Knee/Hip Exercises: Aerobic   Recumbent Bike Lvl 2 full rev seat 7 for extension movement: 10 mins      Knee/Hip Exercises: Standing   Forward Step Up 2 sets;10 reps;Step Height: 4";Left      Knee/Hip Exercises: Supine   Straight Leg Raises 2 sets;10 reps;Left      Vasopneumatic   Number Minutes Vasopneumatic  10 minutes    Vasopnuematic Location  Knee    Vasopneumatic Pressure Low    Vasopneumatic Temperature  34                    PT Short Term Goals - 06/30/20 1202      PT SHORT TERM GOAL #1   Title Patient will demonstrate independent use of home exercise program to maintain progress from in clinic treatments.    Baseline cues still required    Time 3    Period Weeks    Status Partially Met    Target Date 07/07/20             PT Long Term Goals - 07/04/20 1105      PT LONG TERM GOAL #1   Title Patient will demonstrate/report pain at worst less than or equal to 2/10 to facilitate minimal limitation in daily activity secondary  to pain symptoms.    Time 8    Period Weeks    Status On-going    Target Date 08/12/19      PT LONG TERM GOAL #2   Title Patient will demonstrate independent use of home exercise program to facilitate ability to maintain/progress functional gains from skilled physical therapy services.    Time 8    Period Weeks    Status On-going      PT LONG TERM GOAL #3   Title Patient will demonstrate Lt knee AROM 0-110 degrees to facilitate ability to perform transfers, sitting, ambulation, stair navigation s restriction due to mobility.    Time 8    Period Weeks    Status On-going      PT LONG TERM GOAL #4   Title Patient will demonstrate Lt LE MMT 5/5 throughout to facilitate ability to perform usual standing, walking, stairs at PLOF s limitation due to symptoms.    Time 8    Period Weeks    Status On-going      PT LONG TERM GOAL #5   Title Pt. will demonstrate independent ambulation community distances > 300 ft for usual ambulation mobility.    Time 8    Period Weeks    Status On-going                 Plan - 07/14/20 1508    Clinical Impression Statement Extension strength and mobility continue to show more difficulty than flexion at ths time. Continued education for long duration stretching at home in supine c heel prop as well as quad strengthening to improve TKE ability.  Able to demonstrate ambulation in clinic c SPC and independent c antalgic gait noted (lacking TKE, maintaining knee flexion in stance) but able to perform safely.    Personal Factors and Comorbidities Comorbidity 3+    Comorbidities PMH: advanced Lt knee OA, Rt TKA 2019,HTN,OSA on CPAP    Examination-Activity Limitations Bend;Carry;Dressing;Lift;Stand;Stairs;Squat;Locomotion Level;Transfers;Sit;Sleep;Bed Mobility;Bathing;Hygiene/Grooming    Examination-Participation Restrictions Cleaning;Community Activity;Laundry;Shop;Meal Prep    Stability/Clinical Decision Making Evolving/Moderate complexity    Rehab  Potential Good    PT Frequency 2x / week    PT Duration 8 weeks  PT Treatment/Interventions ADLs/Self Care Home Management;Cryotherapy;Electrical Stimulation;Iontophoresis 68m/ml Dexamethasone;Moist Heat;Traction;Parrafin;Gait training;Stair training;Therapeutic activities;Therapeutic exercise;Balance training;Neuromuscular re-education;Manual techniques;Dry needling;Passive range of motion;Joint Manipulations;Spinal Manipulations;Taping;Vasopneumatic Device;Patient/family education    PT Next Visit Plan Compliant surface balance, extension based strengthening, mobimlity    PT Home Exercise Plan [id number]    Consulted and Agree with Plan of Care Patient           Patient will benefit from skilled therapeutic intervention in order to improve the following deficits and impairments:  Abnormal gait,Decreased activity tolerance,Decreased balance,Decreased endurance,Decreased range of motion,Decreased mobility,Decreased strength,Hypomobility,Increased edema,Difficulty walking,Impaired flexibility,Increased muscle spasms,Increased fascial restricitons,Pain,Improper body mechanics,Postural dysfunction  Visit Diagnosis: Chronic pain of left knee  Muscle weakness (generalized)  Difficulty in walking, not elsewhere classified  Stiffness of left knee, not elsewhere classified  Localized edema     Problem List Patient Active Problem List   Diagnosis Date Noted  . Status post total left knee replacement 06/06/2020  . Positive ANA (antinuclear antibody) 04/25/2020  . Primary osteoarthritis of left knee 04/14/2020  . Lumbar radiculopathy 06/26/2019  . Left-sided low back pain with left-sided sciatica 06/26/2019  . Thoracic spinal stenosis 04/18/2019  . Seasonal allergies 12/04/2018  . Allergic rhinoconjunctivitis 10/10/2018  . Moderate persistent asthma with acute exacerbation 10/10/2018  . Pruritus 10/10/2018  . Aortic valve sclerosis 10/06/2018  . Bilateral carotid bruits 10/06/2018  .  Abnormal stress ECG 10/06/2018  . Laboratory examination 10/06/2018  . Palpitations 10/06/2018  . Total knee replacement status 09/19/2017  . Sprain of anterior talofibular ligament of right ankle 12/13/2016    MScot Jun PT, DPT, OCS, ATC 07/14/20  3:13 PM    [facility] [address] [address]Phone: [phone]  Fax:  [phone] Name: Rachel SACHDEVAMRN: [id number] of Birth: 314-Aug-1947

## 2020-07-14 NOTE — Telephone Encounter (Signed)
Pt came into the office stating she needs a refill on her oxycodone.

## 2020-07-15 ENCOUNTER — Telehealth: Payer: Self-pay | Admitting: Physician Assistant

## 2020-07-15 NOTE — Telephone Encounter (Signed)
I received a call from the patient requesting a refill of hydromorphone.  I discussed that this prescription was refilled by Jari Sportsman, PA-C on 07/11/20, and I will be unable to refill this medication.  I provided the telephone number from [facility] for her to call to request the refill.     Sherron Ales, PA-C

## 2020-07-16 ENCOUNTER — Other Ambulatory Visit: Payer: Self-pay | Admitting: Physician Assistant

## 2020-07-16 MED ORDER — OXYCODONE-ACETAMINOPHEN 5-325 MG PO TABS
1.0000 | ORAL_TABLET | Freq: Two times a day (BID) | ORAL | 0 refills | Status: DC | PRN
Start: 2020-07-16 — End: 2020-07-25

## 2020-07-16 NOTE — Telephone Encounter (Signed)
Sent in

## 2020-07-19 ENCOUNTER — Ambulatory Visit (INDEPENDENT_AMBULATORY_CARE_PROVIDER_SITE_OTHER): Payer: Medicare Other | Admitting: Rehabilitative and Restorative Service Providers"

## 2020-07-19 ENCOUNTER — Other Ambulatory Visit: Payer: Self-pay | Admitting: Physician Assistant

## 2020-07-19 ENCOUNTER — Other Ambulatory Visit: Payer: Self-pay

## 2020-07-19 DIAGNOSIS — M25662 Stiffness of left knee, not elsewhere classified: Secondary | ICD-10-CM | POA: Diagnosis not present

## 2020-07-19 DIAGNOSIS — R600 Localized edema: Secondary | ICD-10-CM | POA: Diagnosis not present

## 2020-07-19 DIAGNOSIS — R262 Difficulty in walking, not elsewhere classified: Secondary | ICD-10-CM | POA: Diagnosis not present

## 2020-07-19 DIAGNOSIS — M6281 Muscle weakness (generalized): Secondary | ICD-10-CM

## 2020-07-19 DIAGNOSIS — M25562 Pain in left knee: Secondary | ICD-10-CM | POA: Diagnosis not present

## 2020-07-19 DIAGNOSIS — G8929 Other chronic pain: Secondary | ICD-10-CM | POA: Diagnosis not present

## 2020-07-19 NOTE — Therapy (Signed)
[place], [place], [address] Phone: [phone]   Fax:  [phone]  Physical Therapy Treatment/Progress Note  Patient Details  Name: Rachel Crane MRN: [id number] Date of Birth: 1946/02/06 Referring Provider (PT): Dr. Erlinda Hong   Encounter Date: 07/19/2020   Progress Note Reporting Period 06/16/2020 to 07/19/2020  See note below for Objective Data and Assessment of Progress/Goals.        PT End of Session - 07/19/20 1415    Visit Number 8    Number of Visits 12    Date for PT Re-Evaluation 08/11/20    Authorization Type Medicare    Progress Note Due on Visit 12    PT Start Time 1346    PT Stop Time 1425    PT Time Calculation (min) 39 min    Activity Tolerance Patient tolerated treatment well    Behavior During Therapy WFL for tasks assessed/performed           Past Medical History:  Diagnosis Date  . Abnormal stress ECG 10/06/2018  . Aortic valve sclerosis 10/06/2018  . Arthritis   . Asthma   . Bilateral carotid bruits 10/06/2018  . Headache    H/O bad HA, since using CPAP- she has had improvement with that issue   . Hypertension   . Laboratory examination 10/06/2018  . OSA on CPAP    settting - 8, uses CPAP q night   . Pre-diabetes   . Shingles     Past Surgical History:  Procedure Laterality Date  . CHOLECYSTECTOMY  1998  . TOTAL KNEE ARTHROPLASTY Right 09/19/2017   Procedure: RIGHT TOTAL KNEE ARTHROPLASTY;  Surgeon: Leandrew Koyanagi, MD;  Location: [facility];  Service: Orthopedics;  Laterality: Right;  . TOTAL KNEE ARTHROPLASTY Left 06/06/2020   Procedure: LEFT TOTAL KNEE ARTHROPLASTY;  Surgeon: Leandrew Koyanagi, MD;  Location: [facility];  Service: Orthopedics;  Laterality: Left;  . TUBAL LIGATION      There were no vitals filed for this visit.   Subjective Assessment - 07/19/20 1352    Subjective Pt. indicated feeling stiffness today. GROC +6 a great deal better at this time.    Pertinent History PMH: lumbar  radicupathy, advanced Lt knee OA, Rt TKA 2019,HTN,OSA on CPAP    Limitations Walking;House hold activities;Sitting;Lifting;Standing    Currently in Pain? Yes    Pain Score 7    pain at worst 7/10 in last week   Pain Location Knee    Pain Orientation Left    Pain Descriptors / Indicators Tightness;Aching    Pain Onset 1 to 4 weeks ago    Aggravating Factors  walking, getting up off low seats, bed mobility.    Pain Relieving Factors medicine, avoiding those movements.              [facility] PT Assessment - 07/19/20 0001      Assessment   Medical Diagnosis Lt TKA    Referring Provider (PT) Dr. Erlinda Hong    Onset Date/Surgical Date 06/06/20      Observation/Other Assessments   Focus on Therapeutic Outcomes (FOTO)  update 42% (eval 34%, expected 62% at d/c)      Single Leg Stance   Comments Lt SLS < 3 seconds, Rt 8 seconds      AROM   Left Knee Extension 0    Left Knee Flexion 122      Strength   Left Knee Flexion 4+/5    Left Knee Extension 4/5  Ambulation/Gait   Gait Comments SPC use into and out of clinic but able to perform independent gait in clinic                         [facility] Adult PT Treatment/Exercise - 07/19/20 0001      Neuro Re-ed    Neuro Re-ed Details  SLS Lt c anterior cone touch c Rt LE x20      Knee/Hip Exercises: Aerobic   Recumbent Bike Lvl 2 full rev seat 7 for extension movement: 10 mins      Knee/Hip Exercises: Machines for Strengthening   Cybex Knee Extension 2 x10 Lt LE eccentric 10 lbs    Cybex Knee Flexion 15 lbs Lt LE SL                    PT Short Term Goals - 06/30/20 1202      PT SHORT TERM GOAL #1   Title Patient will demonstrate independent use of home exercise program to maintain progress from in clinic treatments.    Baseline cues still required    Time 3    Period Weeks    Status Partially Met    Target Date 07/07/20             PT Long Term Goals - 07/19/20 1415      PT LONG TERM GOAL #1   Title  Patient will demonstrate/report pain at worst less than or equal to 2/10 to facilitate minimal limitation in daily activity secondary to pain symptoms.    Time 8    Period Weeks    Status On-going      PT LONG TERM GOAL #2   Title Patient will demonstrate independent use of home exercise program to facilitate ability to maintain/progress functional gains from skilled physical therapy services.    Time 8    Period Weeks    Status On-going      PT LONG TERM GOAL #3   Title Patient will demonstrate Lt knee AROM 0-110 degrees to facilitate ability to perform transfers, sitting, ambulation, stair navigation s restriction due to mobility.    Time 8    Period Weeks    Status Achieved      PT LONG TERM GOAL #4   Title Patient will demonstrate Lt LE MMT 5/5 throughout to facilitate ability to perform usual standing, walking, stairs at PLOF s limitation due to symptoms.    Time 8    Period Weeks    Status On-going      PT LONG TERM GOAL #5   Title Pt. will demonstrate independent ambulation community distances > 300 ft for usual ambulation mobility.    Time 8    Period Weeks    Status Achieved                 Plan - 07/19/20 1357    Clinical Impression Statement Pt. has attended 8 visits overall during course of treatment until now.  GROC rated at +6 at this time but still having pain up to 7/10 at times.  See objective data for updated information.  Pt. has made progress in overall mobility, strength and movement coordination but does continue to present c Lt knee complaints c strength and movement coordination deficits that may continue to benefit from skilled PT services.    Personal Factors and Comorbidities Comorbidity 3+    Comorbidities PMH: advanced Lt knee OA, Rt TKA 2019,HTN,OSA on  CPAP    Examination-Activity Limitations Bend;Carry;Dressing;Lift;Stand;Stairs;Squat;Locomotion Level;Transfers;Sit;Sleep;Bed Mobility;Bathing;Hygiene/Grooming    Examination-Participation  Restrictions Cleaning;Community Activity;Laundry;Shop;Meal Prep    Stability/Clinical Decision Making Evolving/Moderate complexity    Rehab Potential Good    PT Frequency 2x / week    PT Duration 8 weeks    PT Treatment/Interventions ADLs/Self Care Home Management;Cryotherapy;Electrical Stimulation;Iontophoresis 77m/ml Dexamethasone;Moist Heat;Traction;Parrafin;Gait training;Stair training;Therapeutic activities;Therapeutic exercise;Balance training;Neuromuscular re-education;Manual techniques;Dry needling;Passive range of motion;Joint Manipulations;Spinal Manipulations;Taping;Vasopneumatic Device;Patient/family education    PT Next Visit Plan Compliant surface balance, extension based strengthening, mobility    PT Home Exercise Plan [id number]    Consulted and Agree with Plan of Care Patient           Patient will benefit from skilled therapeutic intervention in order to improve the following deficits and impairments:  Abnormal gait,Decreased activity tolerance,Decreased balance,Decreased endurance,Decreased range of motion,Decreased mobility,Decreased strength,Hypomobility,Increased edema,Difficulty walking,Impaired flexibility,Increased muscle spasms,Increased fascial restricitons,Pain,Improper body mechanics,Postural dysfunction  Visit Diagnosis: Chronic pain of left knee  Muscle weakness (generalized)  Difficulty in walking, not elsewhere classified  Stiffness of left knee, not elsewhere classified  Localized edema     Problem List Patient Active Problem List   Diagnosis Date Noted  . Status post total left knee replacement 06/06/2020  . Positive ANA (antinuclear antibody) 04/25/2020  . Primary osteoarthritis of left knee 04/14/2020  . Lumbar radiculopathy 06/26/2019  . Left-sided low back pain with left-sided sciatica 06/26/2019  . Thoracic spinal stenosis 04/18/2019  . Seasonal allergies 12/04/2018  . Allergic rhinoconjunctivitis 10/10/2018  . Moderate persistent asthma  with acute exacerbation 10/10/2018  . Pruritus 10/10/2018  . Aortic valve sclerosis 10/06/2018  . Bilateral carotid bruits 10/06/2018  . Abnormal stress ECG 10/06/2018  . Laboratory examination 10/06/2018  . Palpitations 10/06/2018  . Total knee replacement status 09/19/2017  . Sprain of anterior talofibular ligament of right ankle 12/13/2016    MScot Jun PT, DPT, OCS, ATC 07/19/20  2:27 PM    [facility] [address] [address]Phone: [phone]  Fax:  [phone] Name: Rachel KOSCHMRN: [id number] of Birth: 304-04-1946

## 2020-07-21 ENCOUNTER — Ambulatory Visit (INDEPENDENT_AMBULATORY_CARE_PROVIDER_SITE_OTHER): Payer: Medicare Other | Admitting: Rehabilitative and Restorative Service Providers"

## 2020-07-21 ENCOUNTER — Encounter: Payer: Self-pay | Admitting: Rehabilitative and Restorative Service Providers"

## 2020-07-21 ENCOUNTER — Other Ambulatory Visit: Payer: Self-pay

## 2020-07-21 ENCOUNTER — Telehealth: Payer: Self-pay | Admitting: Orthopaedic Surgery

## 2020-07-21 DIAGNOSIS — G8929 Other chronic pain: Secondary | ICD-10-CM | POA: Diagnosis not present

## 2020-07-21 DIAGNOSIS — R600 Localized edema: Secondary | ICD-10-CM | POA: Diagnosis not present

## 2020-07-21 DIAGNOSIS — R262 Difficulty in walking, not elsewhere classified: Secondary | ICD-10-CM | POA: Diagnosis not present

## 2020-07-21 DIAGNOSIS — M25562 Pain in left knee: Secondary | ICD-10-CM

## 2020-07-21 DIAGNOSIS — M6281 Muscle weakness (generalized): Secondary | ICD-10-CM | POA: Diagnosis not present

## 2020-07-21 DIAGNOSIS — M25662 Stiffness of left knee, not elsewhere classified: Secondary | ICD-10-CM | POA: Diagnosis not present

## 2020-07-21 NOTE — Therapy (Signed)
[place], [place], [address] Phone: [phone]   Fax:  [phone]  Physical Therapy Treatment  Patient Details  Name: Rachel Crane MRN: [id number] Date of Birth: 08-04-1945 Referring Provider (PT): Dr. Erlinda Hong   Encounter Date: 07/21/2020   PT End of Session - 07/21/20 1353    Visit Number 9    Number of Visits 12    Date for PT Re-Evaluation 08/11/20    Authorization Type Medicare    Progress Note Due on Visit 12    PT Start Time 1347    PT Stop Time 1427    PT Time Calculation (min) 40 min    Activity Tolerance Patient tolerated treatment well    Behavior During Therapy [facility] for tasks assessed/performed           Past Medical History:  Diagnosis Date  . Abnormal stress ECG 10/06/2018  . Aortic valve sclerosis 10/06/2018  . Arthritis   . Asthma   . Bilateral carotid bruits 10/06/2018  . Headache    H/O bad HA, since using CPAP- she has had improvement with that issue   . Hypertension   . Laboratory examination 10/06/2018  . OSA on CPAP    settting - 8, uses CPAP q night   . Pre-diabetes   . Shingles     Past Surgical History:  Procedure Laterality Date  . CHOLECYSTECTOMY  1998  . TOTAL KNEE ARTHROPLASTY Right 09/19/2017   Procedure: RIGHT TOTAL KNEE ARTHROPLASTY;  Surgeon: Leandrew Koyanagi, MD;  Location: [place];  Service: Orthopedics;  Laterality: Right;  . TOTAL KNEE ARTHROPLASTY Left 06/06/2020   Procedure: LEFT TOTAL KNEE ARTHROPLASTY;  Surgeon: Leandrew Koyanagi, MD;  Location: [place];  Service: Orthopedics;  Laterality: Left;  . TUBAL LIGATION      There were no vitals filed for this visit.   Subjective Assessment - 07/21/20 1352    Subjective Pt. stated stiffness is almost constant, "can't" sit still."    Pertinent History PMH: lumbar radicupathy, advanced Lt knee OA, Rt TKA 2019,HTN,OSA on CPAP    Limitations Walking;House hold activities;Sitting;Lifting;Standing    Currently in Pain? Yes    Pain Score --    moderate   Pain Location Knee    Pain Orientation Left    Pain Descriptors / Indicators Tightness;Aching    Pain Onset 1 to 4 weeks ago    Pain Frequency Constant                             [facility] Adult PT Treatment/Exercise - 07/21/20 0001      Neuro Re-ed    Neuro Re-ed Details  tandem ambulation on foam in // bars HHA touch down 8 ft x 6 fwd, SLS c 6 inch cone touch contralateral LE 20x      Knee/Hip Exercises: Aerobic   Recumbent Bike Seat 7 for full extension full rev 10 mins      Knee/Hip Exercises: Machines for Strengthening   Total Gym Leg Press Lt SLE 50 lbs 2 x 10      Knee/Hip Exercises: Seated   Other Seated Knee/Hip Exercises seated SLR Lt 2 x 10                    PT Short Term Goals - 06/30/20 1202      PT SHORT TERM GOAL #1   Title Patient will demonstrate independent use of home exercise  program to maintain progress from in clinic treatments.    Baseline cues still required    Time 3    Period Weeks    Status Partially Met    Target Date 07/07/20             PT Long Term Goals - 07/19/20 1415      PT LONG TERM GOAL #1   Title Patient will demonstrate/report pain at worst less than or equal to 2/10 to facilitate minimal limitation in daily activity secondary to pain symptoms.    Time 8    Period Weeks    Status On-going      PT LONG TERM GOAL #2   Title Patient will demonstrate independent use of home exercise program to facilitate ability to maintain/progress functional gains from skilled physical therapy services.    Time 8    Period Weeks    Status On-going      PT LONG TERM GOAL #3   Title Patient will demonstrate Lt knee AROM 0-110 degrees to facilitate ability to perform transfers, sitting, ambulation, stair navigation s restriction due to mobility.    Time 8    Period Weeks    Status Achieved      PT LONG TERM GOAL #4   Title Patient will demonstrate Lt LE MMT 5/5 throughout to facilitate ability to  perform usual standing, walking, stairs at PLOF s limitation due to symptoms.    Time 8    Period Weeks    Status On-going      PT LONG TERM GOAL #5   Title Pt. will demonstrate independent ambulation community distances > 300 ft for usual ambulation mobility.    Time 8    Period Weeks    Status Achieved                 Plan - 07/21/20 1406    Clinical Impression Statement Continued reported stiffness consistent through day with short term changes c HEP.  Observed functional Lt knee mobility continued to show WFL at this time c improved quality c repeated movement.  Continued strengthening/load increase tolerance in WB indicated for functional ambulation/stairs as well as balance improvements warrented.    Personal Factors and Comorbidities Comorbidity 3+    Comorbidities PMH: advanced Lt knee OA, Rt TKA 2019,HTN,OSA on CPAP    Examination-Activity Limitations Bend;Carry;Dressing;Lift;Stand;Stairs;Squat;Locomotion Level;Transfers;Sit;Sleep;Bed Mobility;Bathing;Hygiene/Grooming    Examination-Participation Restrictions Cleaning;Community Activity;Laundry;Shop;Meal Prep    Stability/Clinical Decision Making Evolving/Moderate complexity    Rehab Potential Good    PT Frequency 2x / week    PT Duration 8 weeks    PT Treatment/Interventions ADLs/Self Care Home Management;Cryotherapy;Electrical Stimulation;Iontophoresis 50m/ml Dexamethasone;Moist Heat;Traction;Parrafin;Gait training;Stair training;Therapeutic activities;Therapeutic exercise;Balance training;Neuromuscular re-education;Manual techniques;Dry needling;Passive range of motion;Joint Manipulations;Spinal Manipulations;Taping;Vasopneumatic Device;Patient/family education    PT Next Visit Plan Compliant surface balance, extension based strengthening, mobility    PT Home Exercise Plan [email or web address]    Consulted and Agree with Plan of Care Patient           Patient will benefit from skilled therapeutic intervention in order to  improve the following deficits and impairments:  Abnormal gait,Decreased activity tolerance,Decreased balance,Decreased endurance,Decreased range of motion,Decreased mobility,Decreased strength,Hypomobility,Increased edema,Difficulty walking,Impaired flexibility,Increased muscle spasms,Increased fascial restricitons,Pain,Improper body mechanics,Postural dysfunction  Visit Diagnosis: Chronic pain of left knee  Muscle weakness (generalized)  Difficulty in walking, not elsewhere classified  Stiffness of left knee, not elsewhere classified  Localized edema     Problem List Patient Active Problem List   Diagnosis Date Noted  .  Status post total left knee replacement 06/06/2020  . Positive ANA (antinuclear antibody) 04/25/2020  . Primary osteoarthritis of left knee 04/14/2020  . Lumbar radiculopathy 06/26/2019  . Left-sided low back pain with left-sided sciatica 06/26/2019  . Thoracic spinal stenosis 04/18/2019  . Seasonal allergies 12/04/2018  . Allergic rhinoconjunctivitis 10/10/2018  . Moderate persistent asthma with acute exacerbation 10/10/2018  . Pruritus 10/10/2018  . Aortic valve sclerosis 10/06/2018  . Bilateral carotid bruits 10/06/2018  . Abnormal stress ECG 10/06/2018  . Laboratory examination 10/06/2018  . Palpitations 10/06/2018  . Total knee replacement status 09/19/2017  . Sprain of anterior talofibular ligament of right ankle 12/13/2016    Scot Jun, PT, DPT, OCS, ATC 07/21/20  2:25 PM    [facility] [address] [place], [place], [address] Phone: [phone]   Fax:  [phone]  Name: ASEES MANFREDI MRN: [id number] Date of Birth: 01-29-1946

## 2020-07-21 NOTE — Telephone Encounter (Signed)
Please advise. Thank you

## 2020-07-21 NOTE — Telephone Encounter (Signed)
Patient called advised the disability company said she will need to return to work next week. Patient said she can not bend to get dressed properly. Patient said she can  hardly walk and have not finished therapy. Patient said she has 6 more weeks of therapy. Patient asked if her disability can be extended until she can feel much better and get around better? Patient asked if she can get a letter stating why she was out of work . Patient said she will  have her HR department fax the letter to the [employer] main office stating she is disabled and can not work. Patient said she can pick up the letter tomorrow.  The number to contact patient is [phone]

## 2020-07-22 ENCOUNTER — Encounter: Payer: Self-pay | Admitting: Physician Assistant

## 2020-07-22 ENCOUNTER — Ambulatory Visit (INDEPENDENT_AMBULATORY_CARE_PROVIDER_SITE_OTHER): Payer: Medicare Other | Admitting: Physician Assistant

## 2020-07-22 ENCOUNTER — Ambulatory Visit (INDEPENDENT_AMBULATORY_CARE_PROVIDER_SITE_OTHER): Payer: Medicare Other

## 2020-07-22 DIAGNOSIS — Z96652 Presence of left artificial knee joint: Secondary | ICD-10-CM

## 2020-07-22 NOTE — Telephone Encounter (Signed)
Yes.  She is coming in to see me today as well

## 2020-07-22 NOTE — Progress Notes (Signed)
Post-Op Visit Note   Patient: Rachel Crane           Date of Birth: 15-Mar-1946           MRN: [id number] Visit Date: 07/22/2020 PCP: Leeroy Cha, MD   Assessment & Plan:  Chief Complaint:  Chief Complaint  Patient presents with  . Left Knee - Routine Post Op, Follow-up   Visit Diagnoses:  1. Hx of total knee replacement, left     Plan: Patient is a pleasant 75 year old female comes in today 6 weeks out left total knee replacement.  She has been doing well.  She notes a soreness for more painful than her right total knee but she is noticing slow but steady progress.  She is in physical therapy and ambulating without assistance.  She is taking oxycodone 1 every 5 hours as needed.  Examination of her left knee shows a fully healed surgical scar without complication.  Range of motion 5 to 115 degrees.  Stable to valgus varus stress.  She is neurovascular intact distally.  At this point, she will continue with physical therapy.  We will keep her out of work for another 6 weeks and she is not ready to return.  Follow-up with [place] in 6 weeks time for repeat evaluation.  Dental prophylaxis reinforced.  Follow-Up Instructions: Return in about 6 weeks (around 09/02/2020).   Orders:  Orders Placed This Encounter  Procedures  . XR Knee 1-2 Views Left   No orders of the defined types were placed in this encounter.   Imaging: XR Knee 1-2 Views Left  Result Date: 07/22/2020 Well-seated prosthesis without complication   PMFS History: Patient Active Problem List   Diagnosis Date Noted  . Status post total left knee replacement 06/06/2020  . Positive ANA (antinuclear antibody) 04/25/2020  . Primary osteoarthritis of left knee 04/14/2020  . Lumbar radiculopathy 06/26/2019  . Left-sided low back pain with left-sided sciatica 06/26/2019  . Thoracic spinal stenosis 04/18/2019  . Seasonal allergies 12/04/2018  . Allergic rhinoconjunctivitis 10/10/2018  . Moderate persistent asthma  with acute exacerbation 10/10/2018  . Pruritus 10/10/2018  . Aortic valve sclerosis 10/06/2018  . Bilateral carotid bruits 10/06/2018  . Abnormal stress ECG 10/06/2018  . Laboratory examination 10/06/2018  . Palpitations 10/06/2018  . Total knee replacement status 09/19/2017  . Sprain of anterior talofibular ligament of right ankle 12/13/2016   Past Medical History:  Diagnosis Date  . Abnormal stress ECG 10/06/2018  . Aortic valve sclerosis 10/06/2018  . Arthritis   . Asthma   . Bilateral carotid bruits 10/06/2018  . Headache    H/O bad HA, since using CPAP- she has had improvement with that issue   . Hypertension   . Laboratory examination 10/06/2018  . OSA on CPAP    settting - 8, uses CPAP q night   . Pre-diabetes   . Shingles     Family History  Problem Relation Age of Onset  . Breast cancer Sister   . Hypertension Mother   . Arthritis Mother     Past Surgical History:  Procedure Laterality Date  . CHOLECYSTECTOMY  1998  . TOTAL KNEE ARTHROPLASTY Right 09/19/2017   Procedure: RIGHT TOTAL KNEE ARTHROPLASTY;  Surgeon: Leandrew Koyanagi, MD;  Location: [place];  Service: Orthopedics;  Laterality: Right;  . TOTAL KNEE ARTHROPLASTY Left 06/06/2020   Procedure: LEFT TOTAL KNEE ARTHROPLASTY;  Surgeon: Leandrew Koyanagi, MD;  Location: [place];  Service: Orthopedics;  Laterality: Left;  .  TUBAL LIGATION     Social History   Occupational History  . Not on file  Tobacco Use  . Smoking status: Never Smoker  . Smokeless tobacco: Never Used  Vaping Use  . Vaping Use: Never used  Substance and Sexual Activity  . Alcohol use: Not Currently  . Drug use: No  . Sexual activity: Not on file

## 2020-07-22 NOTE — Telephone Encounter (Signed)
Can you make sure patient gets her letter?  See message below.  Thank you.

## 2020-07-25 ENCOUNTER — Telehealth: Payer: Self-pay | Admitting: Orthopaedic Surgery

## 2020-07-25 ENCOUNTER — Other Ambulatory Visit: Payer: Self-pay | Admitting: Physician Assistant

## 2020-07-25 MED ORDER — OXYCODONE-ACETAMINOPHEN 5-325 MG PO TABS: 1.0000 | ORAL_TABLET | Freq: Four times a day (QID) | ORAL | 0 refills | Status: DC | PRN

## 2020-07-25 NOTE — Telephone Encounter (Signed)
Please advise 

## 2020-07-25 NOTE — Telephone Encounter (Signed)
I called and advised. 

## 2020-07-25 NOTE — Telephone Encounter (Signed)
Pt called stating she needs a refill of her oxycodone and she would like to have it called in today as she is on her last two tablets.   [phone]

## 2020-07-25 NOTE — Telephone Encounter (Signed)
Sent in

## 2020-07-26 ENCOUNTER — Telehealth: Payer: Self-pay

## 2020-07-26 NOTE — Telephone Encounter (Signed)
Faxed work note to [employer] per patients request

## 2020-07-27 ENCOUNTER — Other Ambulatory Visit: Payer: Self-pay

## 2020-07-27 ENCOUNTER — Ambulatory Visit (INDEPENDENT_AMBULATORY_CARE_PROVIDER_SITE_OTHER): Payer: Medicare Other | Admitting: Physical Therapy

## 2020-07-27 ENCOUNTER — Encounter: Payer: Self-pay | Admitting: Physical Therapy

## 2020-07-27 DIAGNOSIS — M25662 Stiffness of left knee, not elsewhere classified: Secondary | ICD-10-CM | POA: Diagnosis not present

## 2020-07-27 DIAGNOSIS — M25562 Pain in left knee: Secondary | ICD-10-CM

## 2020-07-27 DIAGNOSIS — M5442 Lumbago with sciatica, left side: Secondary | ICD-10-CM

## 2020-07-27 DIAGNOSIS — G8929 Other chronic pain: Secondary | ICD-10-CM

## 2020-07-27 DIAGNOSIS — R600 Localized edema: Secondary | ICD-10-CM | POA: Diagnosis not present

## 2020-07-27 DIAGNOSIS — M6281 Muscle weakness (generalized): Secondary | ICD-10-CM

## 2020-07-27 DIAGNOSIS — R2689 Other abnormalities of gait and mobility: Secondary | ICD-10-CM | POA: Diagnosis not present

## 2020-07-27 DIAGNOSIS — R262 Difficulty in walking, not elsewhere classified: Secondary | ICD-10-CM | POA: Diagnosis not present

## 2020-07-27 NOTE — Therapy (Addendum)
[facility] Physical Therapy [address] [place], [place], [address] Phone: [phone]   Fax:  [phone]  Physical Therapy Treatment  Patient Details  Name: Rachel Crane MRN: [id number] Date of Birth: Jun 29, 1946 Referring Provider (PT): Dr. Erlinda Hong   Encounter Date: 07/27/2020   PT End of Session - 07/27/20 0943    Visit Number 10    Number of Visits 12    Date for PT Re-Evaluation 08/11/20    Authorization Type Medicare    Progress Note Due on Visit 12    PT Start Time 0934    PT Stop Time 1022    PT Time Calculation (min) 48 min    Equipment Utilized During Treatment Gait belt    Activity Tolerance Patient tolerated treatment well    Behavior During Therapy [facility] for tasks assessed/performed           Past Medical History:  Diagnosis Date  . Abnormal stress ECG 10/06/2018  . Aortic valve sclerosis 10/06/2018  . Arthritis   . Asthma   . Bilateral carotid bruits 10/06/2018  . Headache    H/O bad HA, since using CPAP- she has had improvement with that issue   . Hypertension   . Laboratory examination 10/06/2018  . OSA on CPAP    settting - 8, uses CPAP q night   . Pre-diabetes   . Shingles     Past Surgical History:  Procedure Laterality Date  . CHOLECYSTECTOMY  1998  . TOTAL KNEE ARTHROPLASTY Right 09/19/2017   Procedure: RIGHT TOTAL KNEE ARTHROPLASTY;  Surgeon: Leandrew Koyanagi, MD;  Location: [place];  Service: Orthopedics;  Laterality: Right;  . TOTAL KNEE ARTHROPLASTY Left 06/06/2020   Procedure: LEFT TOTAL KNEE ARTHROPLASTY;  Surgeon: Leandrew Koyanagi, MD;  Location: [place];  Service: Orthopedics;  Laterality: Left;  . TUBAL LIGATION      There were no vitals filed for this visit.   Subjective Assessment - 07/27/20 0940    Subjective Pt stating I'm having to take my pain meds every 5 hours or "I"m in trouble".    Pertinent History PMH: lumbar radicupathy, advanced Lt knee OA, Rt TKA 2019,HTN,OSA on CPAP    Limitations Walking;House hold  activities;Sitting;Lifting;Standing    Currently in Pain? Yes    Pain Score 4     Pain Location Knee    Pain Orientation Left    Pain Descriptors / Indicators Aching;Tightness    Pain Type Surgical pain;Acute pain    Pain Onset 1 to 4 weeks ago              [facility] PT Assessment - 07/27/20 0001      Assessment   Medical Diagnosis Lt TKA    Referring Provider (PT) Dr. Erlinda Hong    Onset Date/Surgical Date 06/06/20      AROM   Left Knee Extension 0    Left Knee Flexion 122                         [facility] Adult PT Treatment/Exercise - 07/27/20 0001      Knee/Hip Exercises: Stretches   Gastroc Stretch 2 reps;30 seconds      Knee/Hip Exercises: Aerobic   Recumbent Bike Seat 7 for full extension full rev 10 mins      Knee/Hip Exercises: Machines for Strengthening   Cybex Knee Extension 2 x 10 L LE only 10#    Cybex Knee Flexion 2x10 L LE only 15#  Total Gym Leg Press Lt SLE 50 lbs 2 x 10      Knee/Hip Exercises: Standing   Lateral Step Up 15 reps;Step Height: 6";Hand Hold: 1    Forward Step Up 15 reps;Hand Hold: 0;Step Height: 6"    Other Standing Knee Exercises Airex: 1/2 tandum stance x 1 minute each LE, tandum walking 10 feet x 4 with intermittent UE support      Knee/Hip Exercises: Seated   Sit to Sand without UE support;10 reps   20 inch table     Vasopneumatic   Number Minutes Vasopneumatic  10 minutes    Vasopnuematic Location  Knee    Vasopneumatic Pressure Medium    Vasopneumatic Temperature  34                    PT Short Term Goals - 07/27/20 0949      PT SHORT TERM GOAL #1   Title Patient will demonstrate independent use of home exercise program to maintain progress from in clinic treatments.    Status Partially Met             PT Long Term Goals - 07/27/20 0949      PT LONG TERM GOAL #1   Title Patient will demonstrate/report pain at worst less than or equal to 2/10 to facilitate minimal limitation in daily activity secondary  to pain symptoms.    Status On-going      PT LONG TERM GOAL #2   Title Patient will demonstrate independent use of home exercise program to facilitate ability to maintain/progress functional gains from skilled physical therapy services.    Status On-going      PT LONG TERM GOAL #3   Title Patient will demonstrate Lt knee AROM 0-110 degrees to facilitate ability to perform transfers, sitting, ambulation, stair navigation s restriction due to mobility.    Status On-going      PT LONG TERM GOAL #4   Title Patient will demonstrate Lt LE MMT 5/5 throughout to facilitate ability to perform usual standing, walking, stairs at PLOF s limitation due to symptoms.    Status On-going      PT LONG TERM GOAL #5   Title Pt. will demonstrate independent ambulation community distances > 300 ft for usual ambulation mobility.    Status Achieved                 Plan - 07/27/20 0944    Clinical Impression Statement Pt tolerating exercises well. Pt arriving with stiffness and reprorting having to take pain meds every 5 hours thorughout the day. Pt has 2 visits left and may need to discuss extending therapy to progress toward more functional status. Pt's goal is to get to [place] to visit her brother in the hospital.    Personal Factors and Comorbidities Comorbidity 3+    Comorbidities PMH: advanced Lt knee OA, Rt TKA 2019,HTN,OSA on CPAP    Examination-Activity Limitations Bend;Carry;Dressing;Lift;Stand;Stairs;Squat;Locomotion Level;Transfers;Sit;Sleep;Bed Mobility;Bathing;Hygiene/Grooming    Examination-Participation Restrictions Cleaning;Community Activity;Laundry;Shop;Meal Prep    Stability/Clinical Decision Making Evolving/Moderate complexity    Rehab Potential Good    PT Frequency 2x / week    PT Duration 8 weeks    PT Treatment/Interventions ADLs/Self Care Home Management;Cryotherapy;Electrical Stimulation;Iontophoresis 1m/ml Dexamethasone;Moist Heat;Traction;Parrafin;Gait training;Stair  training;Therapeutic activities;Therapeutic exercise;Balance training;Neuromuscular re-education;Manual techniques;Dry needling;Passive range of motion;Joint Manipulations;Spinal Manipulations;Taping;Vasopneumatic Device;Patient/family education    PT Next Visit Plan Compliant surface balance, extension based strengthening, mobility    PT HNelson  Consulted and Agree with Plan of Care Patient           Patient will benefit from skilled therapeutic intervention in order to improve the following deficits and impairments:  Abnormal gait,Decreased activity tolerance,Decreased balance,Decreased endurance,Decreased range of motion,Decreased mobility,Decreased strength,Hypomobility,Increased edema,Difficulty walking,Impaired flexibility,Increased muscle spasms,Increased fascial restricitons,Pain,Improper body mechanics,Postural dysfunction  Visit Diagnosis: Chronic pain of left knee  Muscle weakness (generalized)  Difficulty in walking, not elsewhere classified  Stiffness of left knee, not elsewhere classified  Localized edema  Chronic bilateral low back pain with left-sided sciatica  Other abnormalities of gait and mobility     Problem List Patient Active Problem List   Diagnosis Date Noted  . Status post total left knee replacement 06/06/2020  . Positive ANA (antinuclear antibody) 04/25/2020  . Primary osteoarthritis of left knee 04/14/2020  . Lumbar radiculopathy 06/26/2019  . Left-sided low back pain with left-sided sciatica 06/26/2019  . Thoracic spinal stenosis 04/18/2019  . Seasonal allergies 12/04/2018  . Allergic rhinoconjunctivitis 10/10/2018  . Moderate persistent asthma with acute exacerbation 10/10/2018  . Pruritus 10/10/2018  . Aortic valve sclerosis 10/06/2018  . Bilateral carotid bruits 10/06/2018  . Abnormal stress ECG 10/06/2018  . Laboratory examination 10/06/2018  . Palpitations 10/06/2018  . Total knee replacement status 09/19/2017   . Sprain of anterior talofibular ligament of right ankle 12/13/2016    Oretha Caprice, PT, MPT 07/27/2020, 10:20 AM  [facility] Physical Therapy [address] [place], [place], [address] Phone: [phone]   Fax:  [phone]  Name: SHAMEL GALYEAN MRN: [id number] Date of Birth: 18-Jun-1946

## 2020-07-29 ENCOUNTER — Encounter: Payer: Self-pay | Admitting: Rehabilitative and Restorative Service Providers"

## 2020-07-29 ENCOUNTER — Ambulatory Visit (INDEPENDENT_AMBULATORY_CARE_PROVIDER_SITE_OTHER): Payer: Medicare Other | Admitting: Physical Therapy

## 2020-07-29 ENCOUNTER — Other Ambulatory Visit: Payer: Self-pay

## 2020-07-29 DIAGNOSIS — I10 Essential (primary) hypertension: Secondary | ICD-10-CM | POA: Diagnosis not present

## 2020-07-29 DIAGNOSIS — G894 Chronic pain syndrome: Secondary | ICD-10-CM | POA: Diagnosis not present

## 2020-07-29 DIAGNOSIS — M25562 Pain in left knee: Secondary | ICD-10-CM | POA: Diagnosis not present

## 2020-07-29 DIAGNOSIS — M6281 Muscle weakness (generalized): Secondary | ICD-10-CM

## 2020-07-29 DIAGNOSIS — R600 Localized edema: Secondary | ICD-10-CM | POA: Diagnosis not present

## 2020-07-29 DIAGNOSIS — M1712 Unilateral primary osteoarthritis, left knee: Secondary | ICD-10-CM | POA: Diagnosis not present

## 2020-07-29 DIAGNOSIS — Z7984 Long term (current) use of oral hypoglycemic drugs: Secondary | ICD-10-CM | POA: Diagnosis not present

## 2020-07-29 DIAGNOSIS — E1169 Type 2 diabetes mellitus with other specified complication: Secondary | ICD-10-CM | POA: Diagnosis not present

## 2020-07-29 DIAGNOSIS — M25662 Stiffness of left knee, not elsewhere classified: Secondary | ICD-10-CM

## 2020-07-29 DIAGNOSIS — R262 Difficulty in walking, not elsewhere classified: Secondary | ICD-10-CM

## 2020-07-29 DIAGNOSIS — G8929 Other chronic pain: Secondary | ICD-10-CM | POA: Diagnosis not present

## 2020-07-29 NOTE — Therapy (Signed)
[facility] Physical Therapy [address] [place], [place], [address] Phone: [phone]   Fax:  [phone]  Physical Therapy Treatment  Patient Details  Name: Rachel Crane MRN: [id number] Date of Birth: 1946-01-14 Referring Provider (PT): Dr. Erlinda Hong   Encounter Date: 07/29/2020   PT End of Session - 07/29/20 1129    Visit Number 11    Number of Visits 12    Date for PT Re-Evaluation 08/11/20    Authorization Type Medicare    Progress Note Due on Visit 12    PT Start Time 1117    PT Stop Time 1205    PT Time Calculation (min) 48 min    Equipment Utilized During Treatment Gait belt    Activity Tolerance Patient tolerated treatment well    Behavior During Therapy [facility] for tasks assessed/performed           Past Medical History:  Diagnosis Date  . Abnormal stress ECG 10/06/2018  . Aortic valve sclerosis 10/06/2018  . Arthritis   . Asthma   . Bilateral carotid bruits 10/06/2018  . Headache    H/O bad HA, since using CPAP- she has had improvement with that issue   . Hypertension   . Laboratory examination 10/06/2018  . OSA on CPAP    settting - 8, uses CPAP q night   . Pre-diabetes   . Shingles     Past Surgical History:  Procedure Laterality Date  . CHOLECYSTECTOMY  1998  . TOTAL KNEE ARTHROPLASTY Right 09/19/2017   Procedure: RIGHT TOTAL KNEE ARTHROPLASTY;  Surgeon: Leandrew Koyanagi, MD;  Location: [place];  Service: Orthopedics;  Laterality: Right;  . TOTAL KNEE ARTHROPLASTY Left 06/06/2020   Procedure: LEFT TOTAL KNEE ARTHROPLASTY;  Surgeon: Leandrew Koyanagi, MD;  Location: [place];  Service: Orthopedics;  Laterality: Left;  . TUBAL LIGATION      There were no vitals filed for this visit.   Subjective Assessment - 07/29/20 1127    Subjective She relays 5/10 Lt knee pain overall today.    Pertinent History PMH: lumbar radicupathy, advanced Lt knee OA, Rt TKA 2019,HTN,OSA on CPAP    Limitations Walking;House hold activities;Sitting;Lifting;Standing    Pain  Onset 1 to 4 weeks ago            [facility] Adult PT Treatment/Exercise - 07/29/20 0001      Knee/Hip Exercises: Aerobic   Recumbent Bike Seat 6 for full extension full rev 10 mins      Knee/Hip Exercises: Machines for Strengthening   Cybex Knee Extension 3 x 10 L LE only 5#    Cybex Knee Flexion 3x10 L LE only 20#    Total Gym Leg Press Lt SL 50 lbs 2 x 10, then 100 lbs bilat push 2 sets of 10      Knee/Hip Exercises: Standing   Lateral Step Up 15 reps;Step Height: 6";Hand Hold: 1    Forward Step Up 15 reps;Hand Hold: 0;Step Height: 6"      Knee/Hip Exercises: Seated   Sit to Sand without UE support;10 reps      Vasopneumatic   Number Minutes Vasopneumatic  10 minutes    Vasopnuematic Location  Knee    Vasopneumatic Pressure Medium    Vasopneumatic Temperature  34      Manual Therapy   Manual therapy comments Lt knee supine PROM                    PT Short Term Goals -  07/27/20 0949      PT SHORT TERM GOAL #1   Title Patient will demonstrate independent use of home exercise program to maintain progress from in clinic treatments.    Status Partially Met             PT Long Term Goals - 07/27/20 0949      PT LONG TERM GOAL #1   Title Patient will demonstrate/report pain at worst less than or equal to 2/10 to facilitate minimal limitation in daily activity secondary to pain symptoms.    Status On-going      PT LONG TERM GOAL #2   Title Patient will demonstrate independent use of home exercise program to facilitate ability to maintain/progress functional gains from skilled physical therapy services.    Status On-going      PT LONG TERM GOAL #3   Title Patient will demonstrate Lt knee AROM 0-110 degrees to facilitate ability to perform transfers, sitting, ambulation, stair navigation s restriction due to mobility.    Status On-going      PT LONG TERM GOAL #4   Title Patient will demonstrate Lt LE MMT 5/5 throughout to facilitate ability to perform usual  standing, walking, stairs at PLOF s limitation due to symptoms.    Status On-going      PT LONG TERM GOAL #5   Title Pt. will demonstrate independent ambulation community distances > 300 ft for usual ambulation mobility.    Status Achieved                 Plan - 07/29/20 1200    Clinical Impression Statement She had overall good tolerance to strength progression except for knee extension machine (she requested to drop weight with this today). She will need progress note/recert next visit.    Personal Factors and Comorbidities Comorbidity 3+    Comorbidities PMH: advanced Lt knee OA, Rt TKA 2019,HTN,OSA on CPAP    Examination-Activity Limitations Bend;Carry;Dressing;Lift;Stand;Stairs;Squat;Locomotion Level;Transfers;Sit;Sleep;Bed Mobility;Bathing;Hygiene/Grooming    Examination-Participation Restrictions Cleaning;Community Activity;Laundry;Shop;Meal Prep    Stability/Clinical Decision Making Evolving/Moderate complexity    Rehab Potential Good    PT Frequency 2x / week    PT Duration 8 weeks    PT Treatment/Interventions ADLs/Self Care Home Management;Cryotherapy;Electrical Stimulation;Iontophoresis 34m/ml Dexamethasone;Moist Heat;Traction;Parrafin;Gait training;Stair training;Therapeutic activities;Therapeutic exercise;Balance training;Neuromuscular re-education;Manual techniques;Dry needling;Passive range of motion;Joint Manipulations;Spinal Manipulations;Taping;Vasopneumatic Device;Patient/family education    PT Next Visit Plan Compliant surface balance, extension based strengthening, mobility    PT Home Exercise Plan [email or web address]    Consulted and Agree with Plan of Care Patient           Patient will benefit from skilled therapeutic intervention in order to improve the following deficits and impairments:  Abnormal gait,Decreased activity tolerance,Decreased balance,Decreased endurance,Decreased range of motion,Decreased mobility,Decreased strength,Hypomobility,Increased  edema,Difficulty walking,Impaired flexibility,Increased muscle spasms,Increased fascial restricitons,Pain,Improper body mechanics,Postural dysfunction  Visit Diagnosis: Chronic pain of left knee  Muscle weakness (generalized)  Difficulty in walking, not elsewhere classified  Stiffness of left knee, not elsewhere classified  Localized edema     Problem List Patient Active Problem List   Diagnosis Date Noted  . Status post total left knee replacement 06/06/2020  . Positive ANA (antinuclear antibody) 04/25/2020  . Primary osteoarthritis of left knee 04/14/2020  . Lumbar radiculopathy 06/26/2019  . Left-sided low back pain with left-sided sciatica 06/26/2019  . Thoracic spinal stenosis 04/18/2019  . Seasonal allergies 12/04/2018  . Allergic rhinoconjunctivitis 10/10/2018  . Moderate persistent asthma with acute exacerbation 10/10/2018  . Pruritus 10/10/2018  . Aortic  valve sclerosis 10/06/2018  . Bilateral carotid bruits 10/06/2018  . Abnormal stress ECG 10/06/2018  . Laboratory examination 10/06/2018  . Palpitations 10/06/2018  . Total knee replacement status 09/19/2017  . Sprain of anterior talofibular ligament of right ankle 12/13/2016    Silvestre Mesi 07/29/2020, 12:02 PM  [facility] Physical Therapy [address] [place], [place], [address] Phone: [phone]   Fax:  [phone]  Name: Rachel Crane MRN: [id number] Date of Birth: 27-Nov-1945

## 2020-08-02 ENCOUNTER — Other Ambulatory Visit: Payer: Self-pay

## 2020-08-02 ENCOUNTER — Telehealth: Payer: Self-pay

## 2020-08-02 ENCOUNTER — Ambulatory Visit (INDEPENDENT_AMBULATORY_CARE_PROVIDER_SITE_OTHER): Payer: Medicare Other | Admitting: Rehabilitative and Restorative Service Providers"

## 2020-08-02 ENCOUNTER — Other Ambulatory Visit: Payer: Self-pay | Admitting: Physician Assistant

## 2020-08-02 DIAGNOSIS — M25662 Stiffness of left knee, not elsewhere classified: Secondary | ICD-10-CM

## 2020-08-02 DIAGNOSIS — R262 Difficulty in walking, not elsewhere classified: Secondary | ICD-10-CM | POA: Diagnosis not present

## 2020-08-02 DIAGNOSIS — M25562 Pain in left knee: Secondary | ICD-10-CM

## 2020-08-02 DIAGNOSIS — R600 Localized edema: Secondary | ICD-10-CM | POA: Diagnosis not present

## 2020-08-02 DIAGNOSIS — G8929 Other chronic pain: Secondary | ICD-10-CM | POA: Diagnosis not present

## 2020-08-02 DIAGNOSIS — M6281 Muscle weakness (generalized): Secondary | ICD-10-CM | POA: Diagnosis not present

## 2020-08-02 MED ORDER — OXYCODONE-ACETAMINOPHEN 5-325 MG PO TABS: 1.0000 | ORAL_TABLET | Freq: Four times a day (QID) | ORAL | 0 refills | Status: DC | PRN

## 2020-08-02 MED ORDER — PROAIR DIGIHALER 108 (90 BASE) MCG/ACT IN AEPB: 2.0000 | INHALATION_SPRAY | RESPIRATORY_TRACT | 0 refills | Status: DC | PRN

## 2020-08-02 MED ORDER — BUDESONIDE-FORMOTEROL FUMARATE 80-4.5 MCG/ACT IN AERO: INHALATION_SPRAY | RESPIRATORY_TRACT | 0 refills | Status: DC

## 2020-08-02 NOTE — Telephone Encounter (Signed)
Patient is scheduled for a follow up appointment tomorrow. I did go ahead and send in a courtesy refill for both the Symbicort and ProAir as she was unsure of which she needed. Dee, patient care coordinator, did inform her that the refills were courtesy only.

## 2020-08-02 NOTE — Telephone Encounter (Signed)
Pt called and would like a refill on her oxycodone

## 2020-08-02 NOTE — Therapy (Signed)
[place], [place], [address] Phone: [phone]   Fax:  [phone]  Physical Therapy Treatment/Recertification  Patient Details  Name: Rachel Crane MRN: [id number] Date of Birth: [birth year] Referring Provider (PT): Dr. Erlinda Hong   Encounter Date: 08/02/2020   Progress Note Reporting Period 07/19/2020 to 08/02/2020  See note below for Objective Data and Assessment of Progress/Goals.        PT End of Session - 08/02/20 1305    Visit Number 12    Number of Visits 22    Date for PT Re-Evaluation 09/13/20    Authorization Type Medicare    Progress Note Due on Visit 22    PT Start Time 1300    PT Stop Time 1340    PT Time Calculation (min) 40 min    Equipment Utilized During Treatment Gait belt    Activity Tolerance Patient tolerated treatment well    Behavior During Therapy WFL for tasks assessed/performed           Past Medical History:  Diagnosis Date  . Abnormal stress ECG 10/06/2018  . Aortic valve sclerosis 10/06/2018  . Arthritis   . Asthma   . Bilateral carotid bruits 10/06/2018  . Headache    H/O bad HA, since using CPAP- she has had improvement with that issue   . Hypertension   . Laboratory examination 10/06/2018  . OSA on CPAP    settting - 8, uses CPAP q night   . Pre-diabetes   . Shingles     Past Surgical History:  Procedure Laterality Date  . CHOLECYSTECTOMY  1998  . TOTAL KNEE ARTHROPLASTY Right 09/19/2017   Procedure: RIGHT TOTAL KNEE ARTHROPLASTY;  Surgeon: Leandrew Koyanagi, MD;  Location: [place];  Service: Orthopedics;  Laterality: Right;  . TOTAL KNEE ARTHROPLASTY Left 06/06/2020   Procedure: LEFT TOTAL KNEE ARTHROPLASTY;  Surgeon: Leandrew Koyanagi, MD;  Location: [place];  Service: Orthopedics;  Laterality: Left;  . TUBAL LIGATION      There were no vitals filed for this visit.   Subjective Assessment - 08/02/20 1310    Subjective Pt. reported feeling Lt knee pain around joint and proximal tibia  c swelling and heat noted at times.  Pt. indicated walking and standing can create pain increases.  Trying to take pain medicine less but still having to take it due to pain levels.  Reported 50% overall improvement.    Pertinent History PMH: lumbar radicupathy, advanced Lt knee OA, Rt TKA 2019,HTN,OSA on CPAP    Limitations Walking;House hold activities;Sitting;Lifting;Standing    Currently in Pain? Yes    Pain Score 3    mild, just took pain medicine   Pain Location Knee    Pain Orientation Left    Pain Descriptors / Indicators Aching;Tightness    Pain Type Surgical pain    Pain Onset More than a month ago    Pain Frequency Constant    Aggravating Factors  walking, standing, ache at insidious onset    Pain Relieving Factors medicine    Effect of Pain on Daily Activities Limited in standing, WB walking, sleeping              [facility] PT Assessment - 08/02/20 0001      Assessment   Medical Diagnosis Lt TKA    Referring Provider (PT) Dr. Erlinda Hong    Onset Date/Surgical Date 06/06/20      Observation/Other Assessments   Focus on Therapeutic Outcomes (FOTO)  07/19/2020 update  42%      AROM   Left Knee Extension 0    Left Knee Flexion 122      Strength   Left Knee Flexion 4+/5    Left Knee Extension 4+/5      Ambulation/Gait   Gait Comments SPC use into and out of clinic but able to perform independent gait in clinic                         [facility] Adult PT Treatment/Exercise - 08/02/20 0001      Neuro Re-ed    Neuro Re-ed Details  tandem stance 1 min x 1 bilateral, alt toe tapping 6 inch step x 20 each LE      Knee/Hip Exercises: Aerobic   Nustep Lvl 6 10 mins LE only seat 8      Knee/Hip Exercises: Machines for Strengthening   Cybex Knee Extension 3 x 10 eccentric lowering 5 lbs    Cybex Knee Flexion 3x10 L LE only 20#      Knee/Hip Exercises: Standing   Forward Step Up Step Height: 6";Left;10 reps   x10 fwd only, x10 fwd/down on Lt LE                  PT Education - 08/02/20 1312    Education Details Continued review of HEP at times    Person(s) Educated Patient    Methods Explanation    Comprehension Verbalized understanding;Returned demonstration            PT Short Term Goals - 07/27/20 0949      PT SHORT TERM GOAL #1   Title Patient will demonstrate independent use of home exercise program to maintain progress from in clinic treatments.    Status Partially Met             PT Long Term Goals - 08/02/20 1322      PT LONG TERM GOAL #1   Title Patient will demonstrate/report pain at worst less than or equal to 2/10 to facilitate minimal limitation in daily activity secondary to pain symptoms.    Time 6    Period Weeks    Status Revised    Target Date 09/13/20      PT LONG TERM GOAL #2   Title Patient will demonstrate independent use of home exercise program to facilitate ability to maintain/progress functional gains from skilled physical therapy services.    Time 6    Period Weeks    Status Revised    Target Date 09/13/20      PT LONG TERM GOAL #3   Title Patient will demonstrate Lt knee AROM 0-110 degrees to facilitate ability to perform transfers, sitting, ambulation, stair navigation s restriction due to mobility.    Time --    Period Weeks    Status Achieved    Target Date --      PT LONG TERM GOAL #4   Title Patient will demonstrate Lt LE MMT 5/5 throughout to facilitate ability to perform usual standing, walking, stairs at PLOF s limitation due to symptoms.    Time 6    Period Weeks    Status Revised    Target Date 09/13/20      PT LONG TERM GOAL #5   Title Pt. will demonstrate independent ambulation community distances > 300 ft for usual ambulation mobility.    Time 6    Period Weeks    Status Revised  Target Date 09/13/20                 Plan - 08/02/20 1307    Clinical Impression Statement Pt. has attended 12 visits overall during course of treatment, reporting  continued Lt knee pain symptoms along the way, reporting overall improvement 50% at this time.  See objective data for updated information showing improvements as documented.  Demonstration of SPC use to and from clinic, sometimes independent at home as well as household distances in clinic < 150 ft.  Pt. may continue to benefit from skilled PT services to reduce pain, continue gains towards goals.  Extension of original plan of care required at this time.    Personal Factors and Comorbidities Comorbidity 3+    Comorbidities PMH: advanced Lt knee OA, Rt TKA 2019,HTN,OSA on CPAP    Examination-Activity Limitations Bend;Carry;Dressing;Lift;Stand;Stairs;Squat;Locomotion Level;Transfers;Sit;Sleep;Bed Mobility;Bathing;Hygiene/Grooming    Examination-Participation Restrictions Cleaning;Community Activity;Laundry;Shop;Meal Prep    Stability/Clinical Decision Making Evolving/Moderate complexity    Rehab Potential Good    PT Frequency 2x / week    PT Duration 6 weeks    PT Treatment/Interventions ADLs/Self Care Home Management;Cryotherapy;Electrical Stimulation;Iontophoresis 22m/ml Dexamethasone;Moist Heat;Traction;Parrafin;Gait training;Stair training;Therapeutic activities;Therapeutic exercise;Balance training;Neuromuscular re-education;Manual techniques;Dry needling;Passive range of motion;Joint Manipulations;Spinal Manipulations;Taping;Vasopneumatic Device;Patient/family education    PT Next Visit Plan Compliant surface balance, extension based strengthening, mobility    PT Home Exercise Plan [id number]    Consulted and Agree with Plan of Care Patient           Patient will benefit from skilled therapeutic intervention in order to improve the following deficits and impairments:  Abnormal gait,Decreased activity tolerance,Decreased balance,Decreased endurance,Decreased range of motion,Decreased mobility,Decreased strength,Hypomobility,Increased edema,Difficulty walking,Impaired flexibility,Increased muscle  spasms,Increased fascial restricitons,Pain,Improper body mechanics,Postural dysfunction  Visit Diagnosis: Chronic pain of left knee  Muscle weakness (generalized)  Difficulty in walking, not elsewhere classified  Stiffness of left knee, not elsewhere classified  Localized edema     Problem List Patient Active Problem List   Diagnosis Date Noted  . Status post total left knee replacement 06/06/2020  . Positive ANA (antinuclear antibody) 04/25/2020  . Primary osteoarthritis of left knee 04/14/2020  . Lumbar radiculopathy 06/26/2019  . Left-sided low back pain with left-sided sciatica 06/26/2019  . Thoracic spinal stenosis 04/18/2019  . Seasonal allergies 12/04/2018  . Allergic rhinoconjunctivitis 10/10/2018  . Moderate persistent asthma with acute exacerbation 10/10/2018  . Pruritus 10/10/2018  . Aortic valve sclerosis 10/06/2018  . Bilateral carotid bruits 10/06/2018  . Abnormal stress ECG 10/06/2018  . Laboratory examination 10/06/2018  . Palpitations 10/06/2018  . Total knee replacement status 09/19/2017  . Sprain of anterior talofibular ligament of right ankle 12/13/2016   MScot Jun PT, DPT, OCS, ATC 08/02/20  1:42 PM    [facility] [address] [place] [address]Phone: [phone]  Fax:  [phone] Name: Rachel GULLICKSONMRN: [id number] of Birth: [birth year]

## 2020-08-02 NOTE — Telephone Encounter (Signed)
Sent in

## 2020-08-03 ENCOUNTER — Ambulatory Visit: Payer: Medicare Other | Admitting: Allergy

## 2020-08-09 ENCOUNTER — Other Ambulatory Visit: Payer: Self-pay | Admitting: Physician Assistant

## 2020-08-09 ENCOUNTER — Telehealth: Payer: Self-pay

## 2020-08-09 ENCOUNTER — Other Ambulatory Visit: Payer: Self-pay

## 2020-08-09 ENCOUNTER — Ambulatory Visit (INDEPENDENT_AMBULATORY_CARE_PROVIDER_SITE_OTHER): Payer: Medicare Other | Admitting: Physical Therapy

## 2020-08-09 DIAGNOSIS — M25662 Stiffness of left knee, not elsewhere classified: Secondary | ICD-10-CM | POA: Diagnosis not present

## 2020-08-09 DIAGNOSIS — R2689 Other abnormalities of gait and mobility: Secondary | ICD-10-CM

## 2020-08-09 DIAGNOSIS — R262 Difficulty in walking, not elsewhere classified: Secondary | ICD-10-CM

## 2020-08-09 DIAGNOSIS — G8929 Other chronic pain: Secondary | ICD-10-CM | POA: Diagnosis not present

## 2020-08-09 DIAGNOSIS — M25562 Pain in left knee: Secondary | ICD-10-CM

## 2020-08-09 DIAGNOSIS — M5442 Lumbago with sciatica, left side: Secondary | ICD-10-CM | POA: Diagnosis not present

## 2020-08-09 DIAGNOSIS — R600 Localized edema: Secondary | ICD-10-CM

## 2020-08-09 DIAGNOSIS — M6281 Muscle weakness (generalized): Secondary | ICD-10-CM | POA: Diagnosis not present

## 2020-08-09 MED ORDER — OXYCODONE-ACETAMINOPHEN 5-325 MG PO TABS: 1.0000 | ORAL_TABLET | Freq: Three times a day (TID) | ORAL | 0 refills | Status: DC | PRN

## 2020-08-09 NOTE — Telephone Encounter (Signed)
Just sent in.  Lets try and wean as much as possible

## 2020-08-09 NOTE — Therapy (Signed)
[facility] [address] [place], [place], [address] Phone: [phone]   Fax:  [phone]  Physical Therapy Treatment  Patient Details  Name: Rachel Crane MRN: [id number] Date of Birth: 1946/01/14 Referring Provider (PT): Dr. Erlinda Hong   Encounter Date: 08/09/2020   PT End of Session - 08/09/20 1028    Visit Number 13    Number of Visits 22    Date for PT Re-Evaluation 09/13/20    Authorization Type Medicare    Progress Note Due on Visit 22    PT Start Time 1021    PT Stop Time 1107    PT Time Calculation (min) 46 min    Equipment Utilized During Treatment Gait belt    Activity Tolerance Patient tolerated treatment well    Behavior During Therapy [facility] for tasks assessed/performed           Past Medical History:  Diagnosis Date  . Abnormal stress ECG 10/06/2018  . Aortic valve sclerosis 10/06/2018  . Arthritis   . Asthma   . Bilateral carotid bruits 10/06/2018  . Headache    H/O bad HA, since using CPAP- she has had improvement with that issue   . Hypertension   . Laboratory examination 10/06/2018  . OSA on CPAP    settting - 8, uses CPAP q night   . Pre-diabetes   . Shingles     Past Surgical History:  Procedure Laterality Date  . CHOLECYSTECTOMY  1998  . TOTAL KNEE ARTHROPLASTY Right 09/19/2017   Procedure: RIGHT TOTAL KNEE ARTHROPLASTY;  Surgeon: Leandrew Koyanagi, MD;  Location: [place];  Service: Orthopedics;  Laterality: Right;  . TOTAL KNEE ARTHROPLASTY Left 06/06/2020   Procedure: LEFT TOTAL KNEE ARTHROPLASTY;  Surgeon: Leandrew Koyanagi, MD;  Location: [place];  Service: Orthopedics;  Laterality: Left;  . TUBAL LIGATION      There were no vitals filed for this visit.   Subjective Assessment - 08/09/20 1032    Subjective Pt arriving to therapy reporting 3/10 pain in her L knee.Pt feels she is limited by her lateral knee pain. Pt reporting she has trouble  Pt feels she has made progress since starting therapy.    Pertinent History PMH:  lumbar radicupathy, advanced Lt knee OA, Rt TKA 2019,HTN,OSA on CPAP    Limitations Walking;House hold activities;Sitting;Lifting;Standing    Currently in Pain? Yes    Pain Score 3     Pain Location Knee    Pain Orientation Left    Pain Descriptors / Indicators Aching;Tightness    Pain Type Surgical pain    Pain Onset More than a month ago                             [facility] Adult PT Treatment/Exercise - 08/09/20 0001      Neuro Re-ed    Neuro Re-ed Details  --      Knee/Hip Exercises: Aerobic   Recumbent Bike seat 7 for full revolution      Knee/Hip Exercises: Machines for Strengthening   Cybex Knee Extension 3 x 10 eccentric lowering 5 lbs    Cybex Knee Flexion 3x10 L LE only 20#      Knee/Hip Exercises: Standing   Lateral Step Up 10 reps;Hand Hold: 0;Step Height: 6"    Forward Step Up Step Height: 6";Left;10 reps;Hand Hold: 0   x10 fwd only, x10 fwd/down on Lt LE   Other Standing Knee Exercises taping 6  inche step with heels alternating x 20 reps      Vasopneumatic   Number Minutes Vasopneumatic  10 minutes    Vasopnuematic Location  Knee    Vasopneumatic Pressure Medium    Vasopneumatic Temperature  34                    PT Short Term Goals - 08/09/20 1031      PT SHORT TERM GOAL #1   Title Patient will demonstrate independent use of home exercise program to maintain progress from in clinic treatments.    Status Partially Met             PT Long Term Goals - 08/09/20 1031      PT LONG TERM GOAL #1   Title Patient will demonstrate/report pain at worst less than or equal to 2/10 to facilitate minimal limitation in daily activity secondary to pain symptoms.    Status On-going      PT LONG TERM GOAL #2   Title Patient will demonstrate independent use of home exercise program to facilitate ability to maintain/progress functional gains from skilled physical therapy services.    Status On-going      PT LONG TERM GOAL #3   Title Patient  will demonstrate Lt knee AROM 0-110 degrees to facilitate ability to perform transfers, sitting, ambulation, stair navigation s restriction due to mobility.    Status Achieved      PT LONG TERM GOAL #4   Title Patient will demonstrate Lt LE MMT 5/5 throughout to facilitate ability to perform usual standing, walking, stairs at PLOF s limitation due to symptoms.    Status On-going      PT LONG TERM GOAL #5   Title Pt. will demonstrate independent ambulation community distances > 300 ft for usual ambulation mobility.    Status On-going                 Plan - 08/09/20 1029    Clinical Impression Statement Pt arriving reporting 3/10 pain in her L knee. Pt tolerating all exercises well progressing with more standing activities and strengthening. Amb to clinic with single point cane with more equalized step length since I saw her last almost 1 week ago. Continue skilled PT to progress toward pt's LTG's not met.    Personal Factors and Comorbidities Comorbidity 3+    Comorbidities PMH: advanced Lt knee OA, Rt TKA 2019,HTN,OSA on CPAP    Examination-Activity Limitations Bend;Carry;Dressing;Lift;Stand;Stairs;Squat;Locomotion Level;Transfers;Sit;Sleep;Bed Mobility;Bathing;Hygiene/Grooming    Examination-Participation Restrictions Cleaning;Community Activity;Laundry;Shop;Meal Prep    Stability/Clinical Decision Making Evolving/Moderate complexity    Rehab Potential Good    PT Frequency 2x / week    PT Duration 6 weeks    PT Treatment/Interventions ADLs/Self Care Home Management;Cryotherapy;Electrical Stimulation;Iontophoresis 52m/ml Dexamethasone;Moist Heat;Traction;Parrafin;Gait training;Stair training;Therapeutic activities;Therapeutic exercise;Balance training;Neuromuscular re-education;Manual techniques;Dry needling;Passive range of motion;Joint Manipulations;Spinal Manipulations;Taping;Vasopneumatic Device;Patient/family education    PT Next Visit Plan Compliant surface balance, extension  based strengthening, mobility    PT Home Exercise Plan [email or web address]    Consulted and Agree with Plan of Care Patient           Patient will benefit from skilled therapeutic intervention in order to improve the following deficits and impairments:  Abnormal gait,Decreased activity tolerance,Decreased balance,Decreased endurance,Decreased range of motion,Decreased mobility,Decreased strength,Hypomobility,Increased edema,Difficulty walking,Impaired flexibility,Increased muscle spasms,Increased fascial restricitons,Pain,Improper body mechanics,Postural dysfunction  Visit Diagnosis: Chronic pain of left knee  Muscle weakness (generalized)  Difficulty in walking, not elsewhere classified  Stiffness of left knee, not elsewhere  classified  Localized edema  Chronic bilateral low back pain with left-sided sciatica  Other abnormalities of gait and mobility     Problem List Patient Active Problem List   Diagnosis Date Noted  . Status post total left knee replacement 06/06/2020  . Positive ANA (antinuclear antibody) 04/25/2020  . Primary osteoarthritis of left knee 04/14/2020  . Lumbar radiculopathy 06/26/2019  . Left-sided low back pain with left-sided sciatica 06/26/2019  . Thoracic spinal stenosis 04/18/2019  . Seasonal allergies 12/04/2018  . Allergic rhinoconjunctivitis 10/10/2018  . Moderate persistent asthma with acute exacerbation 10/10/2018  . Pruritus 10/10/2018  . Aortic valve sclerosis 10/06/2018  . Bilateral carotid bruits 10/06/2018  . Abnormal stress ECG 10/06/2018  . Laboratory examination 10/06/2018  . Palpitations 10/06/2018  . Total knee replacement status 09/19/2017  . Sprain of anterior talofibular ligament of right ankle 12/13/2016    Oretha Caprice, PT , MPT 08/09/2020, 10:57 AM  [facility] [address] [place], [place], [address] Phone: [phone]   Fax:  [phone]  Name: Rachel Crane MRN: [id number] Date of  Birth: 1945-11-27

## 2020-08-09 NOTE — Telephone Encounter (Signed)
Patient called she is requesting rx refill for oxycodone CB:[phone]

## 2020-08-09 NOTE — Telephone Encounter (Signed)
Pt called again stating she's at the pharmacy; I let her know as soon as Ria Comment or Dr.Xu get a moment they will send it in. She would like a CB when this has been done.   [phone]

## 2020-08-11 ENCOUNTER — Encounter: Payer: Medicare Other | Admitting: Rehabilitative and Restorative Service Providers"

## 2020-08-11 ENCOUNTER — Telehealth: Payer: Self-pay | Admitting: Rehabilitative and Restorative Service Providers"

## 2020-08-11 NOTE — Telephone Encounter (Signed)
Called and left voice message about missed appointment today at 1pm.  Reminded of next MD visit and next therapy visit.   Scot Jun, PT, DPT, OCS, ATC 08/11/20  1:26 PM

## 2020-08-12 ENCOUNTER — Other Ambulatory Visit: Payer: Self-pay

## 2020-08-12 ENCOUNTER — Ambulatory Visit (INDEPENDENT_AMBULATORY_CARE_PROVIDER_SITE_OTHER): Payer: Medicare Other | Admitting: Orthopaedic Surgery

## 2020-08-12 ENCOUNTER — Ambulatory Visit: Payer: Self-pay

## 2020-08-12 DIAGNOSIS — Z96652 Presence of left artificial knee joint: Secondary | ICD-10-CM | POA: Diagnosis not present

## 2020-08-12 MED ORDER — DICLOFENAC SODIUM 75 MG PO TBEC: 75.0000 mg | DELAYED_RELEASE_TABLET | Freq: Two times a day (BID) | ORAL | 2 refills | Status: DC

## 2020-08-12 MED ORDER — TRAMADOL HCL 50 MG PO TABS: 50.0000 mg | ORAL_TABLET | Freq: Every day | ORAL | 0 refills | Status: DC | PRN

## 2020-08-12 NOTE — Progress Notes (Signed)
Post-Op Visit Note   Patient: Rachel Crane           Date of Birth: 26-Apr-1946           MRN: [id number] Visit Date: 08/12/2020 PCP: Leeroy Cha, MD   Assessment & Plan:  Chief Complaint:  Chief Complaint  Patient presents with  . Left Knee - Follow-up   Visit Diagnoses:  1. Status post total left knee replacement     Plan:   Giulliana is approximately 2 months status post left total knee replacement.  She is complaining of some pain below the knee.  Ambulating with a cane.  She feels some discomfort but overall feeling much better.  She continues to take oxycodone on a regular basis.  Left knee shows a fully healed surgical scar.  Her range of motion is excellent.  Minimal swelling.  Stable to varus valgus.  She has slight tenderness throughout the knee.  From my standpoint she is improving well.  We will need to discontinue the oxycodone at this point.  I sent in a prescription for tramadol and ibuprofen to alternate.  She will also use ice aggressively.  Overall she is recovering well.  She will need to remain out of work until we see her back in a month.  Follow-Up Instructions: Return in about 1 month (around 09/12/2020).   Orders:  Orders Placed This Encounter  Procedures  . XR Knee 1-2 Views Left   Meds ordered this encounter  Medications  . traMADol (ULTRAM) 50 MG tablet    Sig: Take 1-2 tablets (50-100 mg total) by mouth daily as needed.    Dispense:  60 tablet    Refill:  0  . diclofenac (VOLTAREN) 75 MG EC tablet    Sig: Take 1 tablet (75 mg total) by mouth 2 (two) times daily.    Dispense:  60 tablet    Refill:  2    Imaging: XR Knee 1-2 Views Left  Result Date: 08/12/2020 Stable total knee replacement in good alignment.    PMFS History: Patient Active Problem List   Diagnosis Date Noted  . Status post total left knee replacement 06/06/2020  . Positive ANA (antinuclear antibody) 04/25/2020  . Primary osteoarthritis of left knee 04/14/2020   . Lumbar radiculopathy 06/26/2019  . Left-sided low back pain with left-sided sciatica 06/26/2019  . Thoracic spinal stenosis 04/18/2019  . Seasonal allergies 12/04/2018  . Allergic rhinoconjunctivitis 10/10/2018  . Moderate persistent asthma with acute exacerbation 10/10/2018  . Pruritus 10/10/2018  . Aortic valve sclerosis 10/06/2018  . Bilateral carotid bruits 10/06/2018  . Abnormal stress ECG 10/06/2018  . Laboratory examination 10/06/2018  . Palpitations 10/06/2018  . Total knee replacement status 09/19/2017  . Sprain of anterior talofibular ligament of right ankle 12/13/2016   Past Medical History:  Diagnosis Date  . Abnormal stress ECG 10/06/2018  . Aortic valve sclerosis 10/06/2018  . Arthritis   . Asthma   . Bilateral carotid bruits 10/06/2018  . Headache    H/O bad HA, since using CPAP- she has had improvement with that issue   . Hypertension   . Laboratory examination 10/06/2018  . OSA on CPAP    settting - 8, uses CPAP q night   . Pre-diabetes   . Shingles     Family History  Problem Relation Age of Onset  . Breast cancer Sister   . Hypertension Mother   . Arthritis Mother     Past Surgical History:  Procedure Laterality  Date  . CHOLECYSTECTOMY  1998  . TOTAL KNEE ARTHROPLASTY Right 09/19/2017   Procedure: RIGHT TOTAL KNEE ARTHROPLASTY;  Surgeon: Leandrew Koyanagi, MD;  Location: [place];  Service: Orthopedics;  Laterality: Right;  . TOTAL KNEE ARTHROPLASTY Left 06/06/2020   Procedure: LEFT TOTAL KNEE ARTHROPLASTY;  Surgeon: Leandrew Koyanagi, MD;  Location: [place];  Service: Orthopedics;  Laterality: Left;  . TUBAL LIGATION     Social History   Occupational History  . Not on file  Tobacco Use  . Smoking status: Never Smoker  . Smokeless tobacco: Never Used  Vaping Use  . Vaping Use: Never used  Substance and Sexual Activity  . Alcohol use: Not Currently  . Drug use: No  . Sexual activity: Not on file

## 2020-08-16 ENCOUNTER — Other Ambulatory Visit: Payer: Self-pay

## 2020-08-16 ENCOUNTER — Ambulatory Visit (INDEPENDENT_AMBULATORY_CARE_PROVIDER_SITE_OTHER): Payer: Medicare Other | Admitting: Physical Therapy

## 2020-08-16 ENCOUNTER — Encounter: Payer: Self-pay | Admitting: Physical Therapy

## 2020-08-16 DIAGNOSIS — M25662 Stiffness of left knee, not elsewhere classified: Secondary | ICD-10-CM | POA: Diagnosis not present

## 2020-08-16 DIAGNOSIS — M25562 Pain in left knee: Secondary | ICD-10-CM | POA: Diagnosis not present

## 2020-08-16 DIAGNOSIS — M6281 Muscle weakness (generalized): Secondary | ICD-10-CM

## 2020-08-16 DIAGNOSIS — R2689 Other abnormalities of gait and mobility: Secondary | ICD-10-CM | POA: Diagnosis not present

## 2020-08-16 DIAGNOSIS — R262 Difficulty in walking, not elsewhere classified: Secondary | ICD-10-CM

## 2020-08-16 DIAGNOSIS — G8929 Other chronic pain: Secondary | ICD-10-CM | POA: Diagnosis not present

## 2020-08-16 DIAGNOSIS — R600 Localized edema: Secondary | ICD-10-CM

## 2020-08-16 NOTE — Therapy (Signed)
[facility] [address] [place], [place], [address] Phone: [phone]   Fax:  [phone]  Physical Therapy Treatment  Patient Details  Name: Rachel Crane MRN: [id number] Date of Birth: June 29, 1946 Referring Provider (PT): Dr. Erlinda Hong   Encounter Date: 08/16/2020   PT End of Session - 08/16/20 1107    Visit Number 14    Number of Visits 22    Date for PT Re-Evaluation 09/13/20    Authorization Type Medicare    Progress Note Due on Visit 22    PT Start Time 1100    PT Stop Time 1157    PT Time Calculation (min) 57 min    Equipment Utilized During Treatment Gait belt    Activity Tolerance Patient tolerated treatment well    Behavior During Therapy [facility] for tasks assessed/performed           Past Medical History:  Diagnosis Date  . Abnormal stress ECG 10/06/2018  . Aortic valve sclerosis 10/06/2018  . Arthritis   . Asthma   . Bilateral carotid bruits 10/06/2018  . Headache    H/O bad HA, since using CPAP- she has had improvement with that issue   . Hypertension   . Laboratory examination 10/06/2018  . OSA on CPAP    settting - 8, uses CPAP q night   . Pre-diabetes   . Shingles     Past Surgical History:  Procedure Laterality Date  . CHOLECYSTECTOMY  1998  . TOTAL KNEE ARTHROPLASTY Right 09/19/2017   Procedure: RIGHT TOTAL KNEE ARTHROPLASTY;  Surgeon: Leandrew Koyanagi, MD;  Location: [place];  Service: Orthopedics;  Laterality: Right;  . TOTAL KNEE ARTHROPLASTY Left 06/06/2020   Procedure: LEFT TOTAL KNEE ARTHROPLASTY;  Surgeon: Leandrew Koyanagi, MD;  Location: [place];  Service: Orthopedics;  Laterality: Left;  . TUBAL LIGATION      There were no vitals filed for this visit.   Subjective Assessment - 08/16/20 1100    Subjective She has been doing her exercises. Standing up from chair is still difficult.    Pertinent History PMH: lumbar radicupathy, advanced Lt knee OA, Rt TKA 2019,HTN,OSA on CPAP    Limitations Walking;House hold  activities;Sitting;Lifting;Standing    Currently in Pain? Yes    Pain Score 5    in last week, worst 8/10, best 4/10   Pain Location Knee    Pain Orientation Left    Pain Descriptors / Indicators Aching;Cramping    Pain Onset More than a month ago    Pain Frequency Constant    Aggravating Factors  walking, standing ADLs    Pain Relieving Factors medication, rest, heat                             [facility] Adult PT Treatment/Exercise - 08/16/20 1100      Knee/Hip Exercises: Aerobic   Recumbent Bike seat 7 for full revolution level 2 8 min      Knee/Hip Exercises: Machines for Strengthening   Cybex Knee Extension 3 x 10 eccentric lowering 5 lbs    Cybex Knee Flexion 3x10 L LE only 20#    Total Gym Leg Press Lt SL 50 lbs 2 x 10, then 100 lbs bilat push 2 sets of 10      Knee/Hip Exercises: Standing   Terminal Knee Extension AROM;Strengthening;Left;4 sets;10 reps;Theraband    Theraband Level (Terminal Knee Extension) Level 3 (Green)    Terminal Knee Extension Limitations  2 sets terminal knee ext & 2 sets holding terminal knee ext while tapping RLE on cone    Lateral Step Up --    Forward Step Up --    Other Standing Knee Exercises taping 6 inche step with heels alternating x 20 reps      Knee/Hip Exercises: Seated   Sit to Sand 10 reps;without UE support   from 24" bar stool     Vasopneumatic   Number Minutes Vasopneumatic  10 minutes    Vasopnuematic Location  Knee    Vasopneumatic Pressure Medium    Vasopneumatic Temperature  34                    PT Short Term Goals - 08/09/20 1031      PT SHORT TERM GOAL #1   Title Patient will demonstrate independent use of home exercise program to maintain progress from in clinic treatments.    Status Partially Met             PT Long Term Goals - 08/09/20 1031      PT LONG TERM GOAL #1   Title Patient will demonstrate/report pain at worst less than or equal to 2/10 to facilitate minimal limitation in  daily activity secondary to pain symptoms.    Status On-going      PT LONG TERM GOAL #2   Title Patient will demonstrate independent use of home exercise program to facilitate ability to maintain/progress functional gains from skilled physical therapy services.    Status On-going      PT LONG TERM GOAL #3   Title Patient will demonstrate Lt knee AROM 0-110 degrees to facilitate ability to perform transfers, sitting, ambulation, stair navigation s restriction due to mobility.    Status Achieved      PT LONG TERM GOAL #4   Title Patient will demonstrate Lt LE MMT 5/5 throughout to facilitate ability to perform usual standing, walking, stairs at PLOF s limitation due to symptoms.    Status On-going      PT LONG TERM GOAL #5   Title Pt. will demonstrate independent ambulation community distances > 300 ft for usual ambulation mobility.    Status On-going                 Plan - 08/16/20 1108    Clinical Impression Statement patient's gait appeared less antalgic exiting PT today than entering.  PT added exercises to clinic program for standing terminal extension and sit to/from stand for 24" surface without UE support.    Personal Factors and Comorbidities Comorbidity 3+    Comorbidities PMH: advanced Lt knee OA, Rt TKA 2019,HTN,OSA on CPAP    Examination-Activity Limitations Bend;Carry;Dressing;Lift;Stand;Stairs;Squat;Locomotion Level;Transfers;Sit;Sleep;Bed Mobility;Bathing;Hygiene/Grooming    Examination-Participation Restrictions Cleaning;Community Activity;Laundry;Shop;Meal Prep    Stability/Clinical Decision Making Evolving/Moderate complexity    Rehab Potential Good    PT Frequency 2x / week    PT Duration 6 weeks    PT Treatment/Interventions ADLs/Self Care Home Management;Cryotherapy;Electrical Stimulation;Iontophoresis 44m/ml Dexamethasone;Moist Heat;Traction;Parrafin;Gait training;Stair training;Therapeutic activities;Therapeutic exercise;Balance training;Neuromuscular  re-education;Manual techniques;Dry needling;Passive range of motion;Joint Manipulations;Spinal Manipulations;Taping;Vasopneumatic Device;Patient/family education    PT Next Visit Plan Compliant surface balance, extension based strengthening, mobility    PT Home Exercise Plan [id number]    Consulted and Agree with Plan of Care Patient           Patient will benefit from skilled therapeutic intervention in order to improve the following deficits and impairments:  Abnormal gait,Decreased activity tolerance,Decreased balance,Decreased endurance,Decreased  range of motion,Decreased mobility,Decreased strength,Hypomobility,Increased edema,Difficulty walking,Impaired flexibility,Increased muscle spasms,Increased fascial restricitons,Pain,Improper body mechanics,Postural dysfunction  Visit Diagnosis: Chronic pain of left knee  Muscle weakness (generalized)  Difficulty in walking, not elsewhere classified  Stiffness of left knee, not elsewhere classified  Localized edema  Other abnormalities of gait and mobility     Problem List Patient Active Problem List   Diagnosis Date Noted  . Status post total left knee replacement 06/06/2020  . Positive ANA (antinuclear antibody) 04/25/2020  . Primary osteoarthritis of left knee 04/14/2020  . Lumbar radiculopathy 06/26/2019  . Left-sided low back pain with left-sided sciatica 06/26/2019  . Thoracic spinal stenosis 04/18/2019  . Seasonal allergies 12/04/2018  . Allergic rhinoconjunctivitis 10/10/2018  . Moderate persistent asthma with acute exacerbation 10/10/2018  . Pruritus 10/10/2018  . Aortic valve sclerosis 10/06/2018  . Bilateral carotid bruits 10/06/2018  . Abnormal stress ECG 10/06/2018  . Laboratory examination 10/06/2018  . Palpitations 10/06/2018  . Total knee replacement status 09/19/2017  . Sprain of anterior talofibular ligament of right ankle 12/13/2016    Jamey Reas, PT, DPT 08/16/2020, 2:14 PM  [facility]  Physical Therapy [address] [place], [place], [address] Phone: [phone]   Fax:  [phone]  Name: Rachel Crane MRN: [id number] Date of Birth: April 28, 1946

## 2020-08-17 ENCOUNTER — Ambulatory Visit (HOSPITAL_COMMUNITY)
Admission: EM | Admit: 2020-08-17 | Discharge: 2020-08-17 | Disposition: A | Discharge status: Home / Self Care | Payer: Medicare Other | Attending: Internal Medicine | Admitting: Internal Medicine

## 2020-08-17 DIAGNOSIS — Z20822 Contact with and (suspected) exposure to covid-19: Secondary | ICD-10-CM | POA: Insufficient documentation

## 2020-08-17 DIAGNOSIS — Z0189 Encounter for other specified special examinations: Secondary | ICD-10-CM | POA: Diagnosis not present

## 2020-08-17 LAB — SARS CORONAVIRUS 2 (TAT 6-24 HRS): SARS Coronavirus 2: NEGATIVE

## 2020-08-17 NOTE — ED Triage Notes (Signed)
Pt presents today for Covid test only. She will be travelling on Sat. 08/20/2020. Denies symptoms.

## 2020-08-18 ENCOUNTER — Ambulatory Visit (INDEPENDENT_AMBULATORY_CARE_PROVIDER_SITE_OTHER): Payer: Medicare Other | Admitting: Rehabilitative and Restorative Service Providers"

## 2020-08-18 ENCOUNTER — Other Ambulatory Visit: Payer: Self-pay

## 2020-08-18 ENCOUNTER — Ambulatory Visit: Payer: Medicare Other | Admitting: Cardiology

## 2020-08-18 ENCOUNTER — Encounter: Payer: Self-pay | Admitting: Rehabilitative and Restorative Service Providers"

## 2020-08-18 ENCOUNTER — Telehealth: Payer: Self-pay | Admitting: Orthopaedic Surgery

## 2020-08-18 ENCOUNTER — Encounter: Payer: Self-pay | Admitting: Cardiology

## 2020-08-18 VITALS — BP 143/49 | HR 72 | Temp 97.7°F | Resp 16 | Ht 66.0 in | Wt 211.8 lb

## 2020-08-18 DIAGNOSIS — M6281 Muscle weakness (generalized): Secondary | ICD-10-CM | POA: Diagnosis not present

## 2020-08-18 DIAGNOSIS — E782 Mixed hyperlipidemia: Secondary | ICD-10-CM | POA: Diagnosis not present

## 2020-08-18 DIAGNOSIS — I10 Essential (primary) hypertension: Secondary | ICD-10-CM | POA: Diagnosis not present

## 2020-08-18 DIAGNOSIS — M25562 Pain in left knee: Secondary | ICD-10-CM | POA: Diagnosis not present

## 2020-08-18 DIAGNOSIS — G8929 Other chronic pain: Secondary | ICD-10-CM | POA: Diagnosis not present

## 2020-08-18 DIAGNOSIS — Z6834 Body mass index (BMI) 34.0-34.9, adult: Secondary | ICD-10-CM | POA: Diagnosis not present

## 2020-08-18 DIAGNOSIS — R2689 Other abnormalities of gait and mobility: Secondary | ICD-10-CM | POA: Diagnosis not present

## 2020-08-18 DIAGNOSIS — M5442 Lumbago with sciatica, left side: Secondary | ICD-10-CM

## 2020-08-18 DIAGNOSIS — R262 Difficulty in walking, not elsewhere classified: Secondary | ICD-10-CM

## 2020-08-18 DIAGNOSIS — E6609 Other obesity due to excess calories: Secondary | ICD-10-CM | POA: Diagnosis not present

## 2020-08-18 DIAGNOSIS — M25662 Stiffness of left knee, not elsewhere classified: Secondary | ICD-10-CM | POA: Diagnosis not present

## 2020-08-18 DIAGNOSIS — R600 Localized edema: Secondary | ICD-10-CM

## 2020-08-18 NOTE — Telephone Encounter (Signed)
Patient request a medical card (that states what type of equipment that's in her knee) to [place] airports. She is upstairs for physical therapy and will come back down about 1:30/2:00pm to pick up today because she will be leaving on Saturday 08/20/20.

## 2020-08-18 NOTE — Progress Notes (Signed)
Rachel Crane Date of Birth: 1946/04/01 MRN: [id number] Primary Care Provider:Varadarajan, Ronie Spies, MD Former Cardiology Providers: Jeri Lager, APRN, FNP-C Primary Cardiologist: Rex Kras, DO, [facility] (established care 04/26/2020)  Date: 08/18/20 Last Office Visit: 04/25/2020  Chief Complaint  Patient presents with  . Follow-up    Post-surgery  . Results    Echo and stress test     HPI  Rachel Crane is a 75 y.o.  female who presents to the office with a chief complaint of "postop follow-up and to review test results" Patient's past medical history and cardiovascular risk factors include: Hypertension, Hyperlipidemia, prediabetes, sleep apnea currently on CPAP, postmenopausal female, advanced age.  Patient was recently referred in October 2021 to the office for preoperative risk stratification for upcoming left knee replacement.  The shared decision was to proceed with an echocardiogram and stress test prior to providing routine preoperative or stratification.  Patient successfully underwent left knee replacement and is progressing well.  She denies any chest pain or heart failure symptoms.  She has lost approximately 5 pounds since last office visit.  Patient states that her home blood pressures are very well controlled with SBP ranging between 113-130 mmHg.  Results of the echocardiogram and stress test were reviewed with her in great detail at today's visit and noted below for further reference.  She was supposed to come back for 37-month follow-up but chooses to come in today as she plans to go to [place] to help her brother who is ill.  ALLERGIES: Allergies  Allergen Reactions  . Hydrocodone-Acetaminophen     Hives and nausea   MEDICATION LIST PRIOR TO VISIT: Current Outpatient Medications on File Prior to Visit  Medication Sig Dispense Refill  . Albuterol Sulfate, sensor, (PROAIR DIGIHALER) 108 (90 Base) MCG/ACT AEPB Inhale 2 puffs into the lungs every 4 (four) hours  as needed (shortness of breath, wheezing, cough). 1 each 0  . aspirin EC 81 MG tablet Take 1 tablet (81 mg total) by mouth 2 (two) times daily. To be taken twice daily x 6 weeks after surgery to prevent blood clots 84 tablet 0  . azelastine (ASTELIN) 0.1 % nasal spray Place 2 sprays into both nostrils 2 (two) times daily. 30 mL 5  . budesonide-formoterol (SYMBICORT) 80-4.5 MCG/ACT inhaler 2 Puffs 2 times daily 1 each 0  . chlorthalidone (HYGROTON) 25 MG tablet Take 25 mg by mouth daily.     . cholecalciferol (VITAMIN D) 25 MCG (1000 UNIT) tablet Take 1,000 Units by mouth daily.    . diclofenac (VOLTAREN) 75 MG EC tablet Take 1 tablet (75 mg total) by mouth 2 (two) times daily. 60 tablet 2  . fluticasone (FLONASE) 50 MCG/ACT nasal spray Place 2 sprays into both nostrils daily. (Patient taking differently: Place 2 sprays into both nostrils daily as needed for rhinitis.) 16 g 5  . gabapentin (NEURONTIN) 300 MG capsule Take 1 capsule (300 mg total) by mouth 3 (three) times daily. 270 capsule 3  . HYDROmorphone (DILAUDID) 2 MG tablet Take 1 tablet (2 mg total) by mouth 3 (three) times daily as needed for severe pain. 14 tablet 0  . loratadine (CLARITIN) 10 MG tablet Take 1 tablet (10 mg total) by mouth daily. 90 tablet 1  . losartan (COZAAR) 100 MG tablet Take 1 tablet (100 mg total) by mouth daily. 30 tablet 3  . Polyethyl Glycol-Propyl Glycol (SYSTANE) 0.4-0.3 % SOLN Place 1 drop into both eyes every morning.    . pravastatin (PRAVACHOL) 40  MG tablet Take 40 mg by mouth daily.     . traMADol (ULTRAM) 50 MG tablet Take 1-2 tablets (50-100 mg total) by mouth daily as needed. 60 tablet 0  . Zinc 50 MG CAPS Take 50 mg by mouth daily.     . Potassium Chloride ER 20 MEQ TBCR Take 1 tablet by mouth daily.     No current facility-administered medications on file prior to visit.    PAST MEDICAL HISTORY: Past Medical History:  Diagnosis Date  . Abnormal stress ECG 10/06/2018  . Aortic valve sclerosis  10/06/2018  . Arthritis   . Asthma   . Bilateral carotid bruits 10/06/2018  . Headache    H/O bad HA, since using CPAP- she has had improvement with that issue   . Hypertension   . Laboratory examination 10/06/2018  . OSA on CPAP    settting - 8, uses CPAP q night   . Pre-diabetes   . Shingles     PAST SURGICAL HISTORY: Past Surgical History:  Procedure Laterality Date  . CHOLECYSTECTOMY  1998  . TOTAL KNEE ARTHROPLASTY Right 09/19/2017   Procedure: RIGHT TOTAL KNEE ARTHROPLASTY;  Surgeon: Leandrew Koyanagi, MD;  Location: [place];  Service: Orthopedics;  Laterality: Right;  . TOTAL KNEE ARTHROPLASTY Left 06/06/2020   Procedure: LEFT TOTAL KNEE ARTHROPLASTY;  Surgeon: Leandrew Koyanagi, MD;  Location: [place];  Service: Orthopedics;  Laterality: Left;  . TUBAL LIGATION      FAMILY HISTORY: The patient's family history includes Arthritis in her mother; Breast cancer in her sister; Hypertension in her mother.   SOCIAL HISTORY:  The patient  reports that she has never smoked. She has never used smokeless tobacco. She reports previous alcohol use. She reports that she does not use drugs.  Review of Systems  Constitutional: Negative for chills and fever.  HENT: Negative for hoarse voice and nosebleeds.   Eyes: Negative for discharge, double vision and pain.  Cardiovascular: Negative for chest pain, claudication, dyspnea on exertion, leg swelling, near-syncope, orthopnea, palpitations, paroxysmal nocturnal dyspnea and syncope.  Respiratory: Negative for hemoptysis and shortness of breath.   Musculoskeletal: Positive for back pain and joint pain. Negative for muscle cramps and myalgias.  Gastrointestinal: Negative for abdominal pain, constipation, diarrhea, hematemesis, hematochezia, melena, nausea and vomiting.  Neurological: Negative for dizziness and light-headedness.    PHYSICAL EXAM: Vitals with BMI 08/18/2020 06/23/2020 06/21/2020  Height 5\' 6"  5\' 6"  5\' 6"   Weight 211 lbs 13 oz 216 lbs 217 lbs   BMI 34.2 0000000 123456  Systolic [id number] -  Diastolic 49 70 -  Pulse 72 - -   CONSTITUTIONAL: Well-developed and well-nourished. No acute distress.  SKIN: Skin is warm and dry. No rash noted. No cyanosis. No pallor. No jaundice HEAD: Normocephalic and atraumatic.  EYES: No scleral icterus MOUTH/THROAT: Moist oral membranes.  NECK: No JVD present. No thyromegaly noted. No carotid bruits  LYMPHATIC: No visible cervical adenopathy.  CHEST Normal respiratory effort. No intercostal retractions  LUNGS: Clear to auscultation bilaterally.  No stridor. No wheezes. No rales.  CARDIOVASCULAR: Regular rate and rhythm, positive S1-S2, no murmurs rubs or gallops appreciated. ABDOMINAL: Obese, soft, nontender, nondistended, positive bowel sounds in all 4 quadrants, no apparent ascites.  EXTREMITIES: No peripheral edema  HEMATOLOGIC: No significant bruising NEUROLOGIC: Oriented to person, place, and time. Nonfocal. Normal muscle tone.  PSYCHIATRIC: Normal mood and affect. Normal behavior. Cooperative  CARDIAC DATABASE: EKG: 04/25/2020: Normal sinus rhythm, 73 bpm, normal axis, without underlying  ischemia or injury pattern.   Echocardiogram:  01/19/2017: LVEF 57%, grade 2 diastolic impairment, moderate LVH, trace MR, trace TR.  04/28/2020: Left ventricle cavity is normal in size. Moderate concentric hypertrophy of the left ventricle. Normal global wall motion. Normal LV systolic function with EF 64%. Doppler evidence of grade I (impaired) diastolic dysfunction, normal LAP.  Left atrial cavity is mildly dilated. Aneurysmal interatrial septum without 2D or color Doppler evidence of interatrial shunt. Mild tricuspid regurgitation. Peak RA-RV gradient 17 mmHg. The IVC is not well visualized. No significant change compared to previous study in 2018.    Stress Testing:  Lexiscan Tetrofosmin stress test 05/02/2020: Lexiscan nuclear stress test performed using 1-day protocol. SPECT images show large areas  of breast attenuation, with imaging performed in sitting position. There is small area of mild intensity decrease in tracer uptake, in basal inferolateral myocardium, more prominent on stress images. While attenuation artifact is likely, small area of ischemia in this region cannot be excluded.  Stress LVEF 76%. Low risk study.   Heart Catheterization: None  LABORATORY DATA: CBC Latest Ref Rng & Units 06/07/2020 06/02/2020 04/18/2019  WBC 4.0 - 10.5 K/uL 9.5 7.4 7.7  Hemoglobin 12.0 - 15.0 g/dL 9.5(L) 11.2(L) 11.1(L)  Hematocrit 36.0 - 46.0 % 29.5(L) 35.8(L) 34.3(L)  Platelets 150 - 400 K/uL 214 237 220    CMP Latest Ref Rng & Units 06/07/2020 06/02/2020 04/18/2019  Glucose 70 - 99 mg/dL 131(H) 156(H) -  BUN 8 - 23 mg/dL 25(H) 14 -  Creatinine 0.44 - 1.00 mg/dL 1.29(H) 0.99 1.08(H)  Sodium 135 - 145 mmol/L 137 138 -  Potassium 3.5 - 5.1 mmol/L 3.6 3.3(L) -  Chloride 98 - 111 mmol/L 101 102 -  CO2 22 - 32 mmol/L 27 26 -  Calcium 8.9 - 10.3 mg/dL 8.9 9.1 -  Total Protein 6.5 - 8.1 g/dL - 7.2 -  Total Bilirubin 0.3 - 1.2 mg/dL - 0.3 -  Alkaline Phos 38 - 126 U/L - 85 -  AST 15 - 41 U/L - 23 -  ALT 0 - 44 U/L - 26 -    Lipid Panel     Component Value Date/Time   CHOL 179 10/06/2018 1012   TRIG 76 10/06/2018 1012   HDL 50 10/06/2018 1012   CHOLHDL 3.6 10/06/2018 1012   LDLCALC 114 (H) 10/06/2018 1012   LABVLDL 15 10/06/2018 1012    No results found for: HGBA1C No components found for: NTPROBNP Lab Results  Component Value Date   TSH 0.980 10/23/2018   TSH 1.200 05/12/2018   TSH 1.140 02/22/2017    Cardiac Panel (last 3 results) No results for input(s): CKTOTAL, CKMB, TROPONINIHS, RELINDX in the last 72 hours.  IMPRESSION:    ICD-10-CM   1. Essential hypertension  I10   2. Mixed hyperlipidemia  E78.2   3. Class 1 obesity due to excess calories without serious comorbidity with body mass index (BMI) of 34.0 to 34.9 in adult  E66.09    Z68.34       RECOMMENDATIONS: Rachel Crane is a 75 y.o. female whose past medical history and cardiovascular risk factors include: Hypertension, Hyperlipidemia, prediabetes, sleep apnea currently on CPAP, postmenopausal female, advanced age.  From a cardiovascular standpoint patient is relatively stable.  She does not have any active chest pain or anginal equivalent.  Office blood pressures are not well controlled.  However her home blood pressures are within acceptable range.  Continue current antihypertensive medications.  Her blood pressures are  currently being managed by her PCP.  She is also currently on statin therapy given her underlying diagnosis of hyperlipidemia.  She does not endorse any myalgias.  I do not have the most recent lipid profile to review.  Patient states that it is currently being managed by PCP.  Patient has multiple cardiovascular risk factors that are currently being managed by her PCP.  She does not need any additional testing at this time.  I will see her on as needed basis or if she chooses on a yearly follow-up.  Her questions and concerns were addressed to her satisfaction and she was very thankful for the workup done prior to her surgery.   Total time spent: 21 minutes.  FINAL MEDICATION LIST END OF ENCOUNTER: No orders of the defined types were placed in this encounter.    Current Outpatient Medications:  .  Albuterol Sulfate, sensor, (PROAIR DIGIHALER) 108 (90 Base) MCG/ACT AEPB, Inhale 2 puffs into the lungs every 4 (four) hours as needed (shortness of breath, wheezing, cough)., Disp: 1 each, Rfl: 0 .  aspirin EC 81 MG tablet, Take 1 tablet (81 mg total) by mouth 2 (two) times daily. To be taken twice daily x 6 weeks after surgery to prevent blood clots, Disp: 84 tablet, Rfl: 0 .  azelastine (ASTELIN) 0.1 % nasal spray, Place 2 sprays into both nostrils 2 (two) times daily., Disp: 30 mL, Rfl: 5 .  budesonide-formoterol (SYMBICORT) 80-4.5 MCG/ACT inhaler, 2 Puffs 2  times daily, Disp: 1 each, Rfl: 0 .  chlorthalidone (HYGROTON) 25 MG tablet, Take 25 mg by mouth daily. , Disp: , Rfl:  .  cholecalciferol (VITAMIN D) 25 MCG (1000 UNIT) tablet, Take 1,000 Units by mouth daily., Disp: , Rfl:  .  diclofenac (VOLTAREN) 75 MG EC tablet, Take 1 tablet (75 mg total) by mouth 2 (two) times daily., Disp: 60 tablet, Rfl: 2 .  fluticasone (FLONASE) 50 MCG/ACT nasal spray, Place 2 sprays into both nostrils daily. (Patient taking differently: Place 2 sprays into both nostrils daily as needed for rhinitis.), Disp: 16 g, Rfl: 5 .  gabapentin (NEURONTIN) 300 MG capsule, Take 1 capsule (300 mg total) by mouth 3 (three) times daily., Disp: 270 capsule, Rfl: 3 .  HYDROmorphone (DILAUDID) 2 MG tablet, Take 1 tablet (2 mg total) by mouth 3 (three) times daily as needed for severe pain., Disp: 14 tablet, Rfl: 0 .  loratadine (CLARITIN) 10 MG tablet, Take 1 tablet (10 mg total) by mouth daily., Disp: 90 tablet, Rfl: 1 .  losartan (COZAAR) 100 MG tablet, Take 1 tablet (100 mg total) by mouth daily., Disp: 30 tablet, Rfl: 3 .  Polyethyl Glycol-Propyl Glycol (SYSTANE) 0.4-0.3 % SOLN, Place 1 drop into both eyes every morning., Disp: , Rfl:  .  pravastatin (PRAVACHOL) 40 MG tablet, Take 40 mg by mouth daily. , Disp: , Rfl:  .  traMADol (ULTRAM) 50 MG tablet, Take 1-2 tablets (50-100 mg total) by mouth daily as needed., Disp: 60 tablet, Rfl: 0 .  Zinc 50 MG CAPS, Take 50 mg by mouth daily. , Disp: , Rfl:  .  Potassium Chloride ER 20 MEQ TBCR, Take 1 tablet by mouth daily., Disp: , Rfl:   No orders of the defined types were placed in this encounter.   --Continue cardiac medications as reconciled in final medication list. --Return in about 1 year (around 08/18/2021) for Follow up . Or sooner if needed. --Continue follow-up with your primary care physician regarding the management of your  other chronic comorbid conditions.  Patient's questions and concerns were addressed to her satisfaction.  She voices understanding of the instructions provided during this encounter.   This note was created using a voice recognition software as a result there may be grammatical errors inadvertently enclosed that do not reflect the nature of this encounter. Every attempt is made to correct such errors.  Rex Kras, [place], [facility]  Pager: [phone] Office: [phone]

## 2020-08-18 NOTE — Telephone Encounter (Signed)
See message.

## 2020-08-18 NOTE — Therapy (Signed)
[facility] [address] [place], [place], [address] Phone: [phone]   Fax:  [phone]  Physical Therapy Treatment  Patient Details  Name: Rachel Crane MRN: [id number] Date of Birth: 05/09/1946 Referring Provider (PT): Dr. Erlinda Hong   Encounter Date: 08/18/2020   PT End of Session - 08/18/20 1322    Visit Number 15    Number of Visits 22    Date for PT Re-Evaluation 09/13/20    Authorization Type Medicare    Progress Note Due on Visit 53    PT Start Time 1258    PT Stop Time 1348    PT Time Calculation (min) 50 min    Equipment Utilized During Treatment Gait belt    Activity Tolerance Patient tolerated treatment well    Behavior During Therapy [facility] for tasks assessed/performed           Past Medical History:  Diagnosis Date  . Abnormal stress ECG 10/06/2018  . Aortic valve sclerosis 10/06/2018  . Arthritis   . Asthma   . Bilateral carotid bruits 10/06/2018  . Headache    H/O bad HA, since using CPAP- she has had improvement with that issue   . Hypertension   . Laboratory examination 10/06/2018  . OSA on CPAP    settting - 8, uses CPAP q night   . Pre-diabetes   . Shingles     Past Surgical History:  Procedure Laterality Date  . CHOLECYSTECTOMY  1998  . TOTAL KNEE ARTHROPLASTY Right 09/19/2017   Procedure: RIGHT TOTAL KNEE ARTHROPLASTY;  Surgeon: Leandrew Koyanagi, MD;  Location: [place];  Service: Orthopedics;  Laterality: Right;  . TOTAL KNEE ARTHROPLASTY Left 06/06/2020   Procedure: LEFT TOTAL KNEE ARTHROPLASTY;  Surgeon: Leandrew Koyanagi, MD;  Location: [place];  Service: Orthopedics;  Laterality: Left;  . TUBAL LIGATION      There were no vitals filed for this visit.       [facility] PT Assessment - 08/18/20 0001      Assessment   Medical Diagnosis Lt TKA    Referring Provider (PT) Dr. Erlinda Hong    Onset Date/Surgical Date 06/06/20      AROM   Left Knee Extension 0    Left Knee Flexion 120      Strength   Left Knee Flexion 5/5    Left  Knee Extension 4+/5    Left Ankle Dorsiflexion 5/5                         [facility] Adult PT Treatment/Exercise - 08/18/20 0001      Self-Care   Self-Care Other Self-Care Comments    Other Self-Care Comments  HEP review for time out of country c cues given, handout provided again.  Cues for frequency, mangement of stiffness c use of HEP, interruption for prolonged sitting/inactivity.      Exercises   Other Exercises  --      Knee/Hip Exercises: Stretches   Gastroc Stretch 30 seconds;3 reps;Both   incline board     Knee/Hip Exercises: Aerobic   Recumbent Bike Seat 6 10 mins lvl 3 full rev      Knee/Hip Exercises: Machines for Strengthening   Cybex Knee Extension 2 x 10 10 lbs SL eccentric lowering Lt      Knee/Hip Exercises: Seated   Other Seated Knee/Hip Exercises seated SLR x 10 Lt LE, LAQ x 10    Sit to Sand without UE support;20 reps  Knee/Hip Exercises: Supine   Other Supine Knee/Hip Exercises supine heel slide x 10 Lt LE, SLR x 10      Vasopneumatic   Number Minutes Vasopneumatic  10 minutes    Vasopnuematic Location  Knee    Vasopneumatic Pressure Medium    Vasopneumatic Temperature  34                    PT Short Term Goals - 08/09/20 1031      PT SHORT TERM GOAL #1   Title Patient will demonstrate independent use of home exercise program to maintain progress from in clinic treatments.    Status Partially Met             PT Long Term Goals - 08/09/20 1031      PT LONG TERM GOAL #1   Title Patient will demonstrate/report pain at worst less than or equal to 2/10 to facilitate minimal limitation in daily activity secondary to pain symptoms.    Status On-going      PT LONG TERM GOAL #2   Title Patient will demonstrate independent use of home exercise program to facilitate ability to maintain/progress functional gains from skilled physical therapy services.    Status On-going      PT LONG TERM GOAL #3   Title Patient will  demonstrate Lt knee AROM 0-110 degrees to facilitate ability to perform transfers, sitting, ambulation, stair navigation s restriction due to mobility.    Status Achieved      PT LONG TERM GOAL #4   Title Patient will demonstrate Lt LE MMT 5/5 throughout to facilitate ability to perform usual standing, walking, stairs at PLOF s limitation due to symptoms.    Status On-going      PT LONG TERM GOAL #5   Title Pt. will demonstrate independent ambulation community distances > 300 ft for usual ambulation mobility.    Status On-going                 Plan - 08/18/20 1317    Clinical Impression Statement See objective data for updated impairment list. Pt. has to leave country for next few weeks due to family emergency.  Reviewed HEP for time away and plan for reassessment upon return to country prior to MD visit.    Personal Factors and Comorbidities Comorbidity 3+    Comorbidities PMH: advanced Lt knee OA, Rt TKA 2019,HTN,OSA on CPAP    Examination-Activity Limitations Bend;Carry;Dressing;Lift;Stand;Stairs;Squat;Locomotion Level;Transfers;Sit;Sleep;Bed Mobility;Bathing;Hygiene/Grooming    Examination-Participation Restrictions Cleaning;Community Activity;Laundry;Shop;Meal Prep    Stability/Clinical Decision Making Evolving/Moderate complexity    Rehab Potential Good    PT Frequency 2x / week    PT Duration 6 weeks    PT Treatment/Interventions ADLs/Self Care Home Management;Cryotherapy;Electrical Stimulation;Iontophoresis 87m/ml Dexamethasone;Moist Heat;Traction;Parrafin;Gait training;Stair training;Therapeutic activities;Therapeutic exercise;Balance training;Neuromuscular re-education;Manual techniques;Dry needling;Passive range of motion;Joint Manipulations;Spinal Manipulations;Taping;Vasopneumatic Device;Patient/family education    PT Next Visit Plan Return March 1st for reassessment, MD visit (end of cert date)    PT [facility]   Consulted and Agree with Plan of Care  Patient           Patient will benefit from skilled therapeutic intervention in order to improve the following deficits and impairments:  Abnormal gait,Decreased activity tolerance,Decreased balance,Decreased endurance,Decreased range of motion,Decreased mobility,Decreased strength,Hypomobility,Increased edema,Difficulty walking,Impaired flexibility,Increased muscle spasms,Increased fascial restricitons,Pain,Improper body mechanics,Postural dysfunction  Visit Diagnosis: Chronic pain of left knee  Muscle weakness (generalized)  Difficulty in walking, not elsewhere classified  Stiffness of left knee, not  elsewhere classified  Localized edema  Other abnormalities of gait and mobility  Chronic bilateral low back pain with left-sided sciatica     Problem List Patient Active Problem List   Diagnosis Date Noted  . Status post total left knee replacement 06/06/2020  . Positive ANA (antinuclear antibody) 04/25/2020  . Primary osteoarthritis of left knee 04/14/2020  . Lumbar radiculopathy 06/26/2019  . Left-sided low back pain with left-sided sciatica 06/26/2019  . Thoracic spinal stenosis 04/18/2019  . Seasonal allergies 12/04/2018  . Allergic rhinoconjunctivitis 10/10/2018  . Moderate persistent asthma with acute exacerbation 10/10/2018  . Pruritus 10/10/2018  . Aortic valve sclerosis 10/06/2018  . Bilateral carotid bruits 10/06/2018  . Abnormal stress ECG 10/06/2018  . Laboratory examination 10/06/2018  . Palpitations 10/06/2018  . Total knee replacement status 09/19/2017  . Sprain of anterior talofibular ligament of right ankle 12/13/2016    Scot Jun, PT, DPT, OCS, ATC 08/18/20  1:40 PM    [facility] [address] [place], [place], [address] Phone: [phone]   Fax:  [phone]  Name: Rachel Crane MRN: [id number] Date of Birth: 05/29/1946

## 2020-08-18 NOTE — Telephone Encounter (Signed)
Ok, thanks for giving her the implant card

## 2020-08-20 DIAGNOSIS — Z20822 Contact with and (suspected) exposure to covid-19: Secondary | ICD-10-CM | POA: Diagnosis not present

## 2020-08-26 ENCOUNTER — Ambulatory Visit: Payer: Medicare Other | Admitting: Allergy

## 2020-09-02 ENCOUNTER — Ambulatory Visit: Payer: Medicare Other | Admitting: Orthopaedic Surgery

## 2020-09-13 ENCOUNTER — Encounter: Payer: Medicare Other | Admitting: Rehabilitative and Restorative Service Providers"

## 2020-09-13 ENCOUNTER — Telehealth: Payer: Self-pay | Admitting: *Deleted

## 2020-09-13 ENCOUNTER — Ambulatory Visit: Payer: Medicare Other | Admitting: Orthopaedic Surgery

## 2020-09-13 NOTE — Telephone Encounter (Signed)
90 day Ortho bundle call completed. 

## 2020-09-15 ENCOUNTER — Encounter: Payer: Self-pay | Admitting: Rehabilitative and Restorative Service Providers"

## 2020-09-15 ENCOUNTER — Other Ambulatory Visit: Payer: Self-pay

## 2020-09-15 ENCOUNTER — Ambulatory Visit (INDEPENDENT_AMBULATORY_CARE_PROVIDER_SITE_OTHER): Payer: Medicare Other | Admitting: Rehabilitative and Restorative Service Providers"

## 2020-09-15 DIAGNOSIS — R262 Difficulty in walking, not elsewhere classified: Secondary | ICD-10-CM | POA: Diagnosis not present

## 2020-09-15 DIAGNOSIS — M25562 Pain in left knee: Secondary | ICD-10-CM

## 2020-09-15 DIAGNOSIS — M6281 Muscle weakness (generalized): Secondary | ICD-10-CM

## 2020-09-15 DIAGNOSIS — G8929 Other chronic pain: Secondary | ICD-10-CM | POA: Diagnosis not present

## 2020-09-15 DIAGNOSIS — M25662 Stiffness of left knee, not elsewhere classified: Secondary | ICD-10-CM | POA: Diagnosis not present

## 2020-09-15 DIAGNOSIS — R600 Localized edema: Secondary | ICD-10-CM

## 2020-09-15 NOTE — Addendum Note (Signed)
Addended by: Scot Jun B on: 09/15/2020 03:21 PM   Modules accepted: Orders

## 2020-09-15 NOTE — Therapy (Signed)
[facility] [address] [place], [place], [address] Phone: [phone]   Fax:  [phone]  Physical Therapy Treatment/Recertification/Discharge  Patient Details  Name: Rachel Crane MRN: [id number] Date of Birth: [birth year] Referring Provider (PT): Dr. Erlinda Hong   Encounter Date: 09/15/2020   PHYSICAL THERAPY DISCHARGE SUMMARY  Visits from Start of Care: 16  Current functional level related to goals / functional outcomes: See note Remaining deficits: See note   Education / Equipment: HEP  Plan: Patient agrees to discharge.  Patient goals were met. Patient is being discharged due to meeting the stated rehab goals.  ?????    Recertification today for today's visit.       PT End of Session - 09/15/20 1442    Visit Number 16    Number of Visits 22    Date for PT Re-Evaluation 09/15/20    Authorization Type Medicare    Progress Note Due on Visit 26    PT Start Time 1437    PT Stop Time 1515    PT Time Calculation (min) 38 min    Equipment Utilized During Treatment --    Activity Tolerance Patient tolerated treatment well    Behavior During Therapy WFL for tasks assessed/performed           Past Medical History:  Diagnosis Date  . Abnormal stress ECG 10/06/2018  . Aortic valve sclerosis 10/06/2018  . Arthritis   . Asthma   . Bilateral carotid bruits 10/06/2018  . Headache    H/O bad HA, since using CPAP- she has had improvement with that issue   . Hypertension   . Laboratory examination 10/06/2018  . OSA on CPAP    settting - 8, uses CPAP q night   . Pre-diabetes   . Shingles     Past Surgical History:  Procedure Laterality Date  . CHOLECYSTECTOMY  1998  . TOTAL KNEE ARTHROPLASTY Right 09/19/2017   Procedure: RIGHT TOTAL KNEE ARTHROPLASTY;  Surgeon: Leandrew Koyanagi, MD;  Location: [place];  Service: Orthopedics;  Laterality: Right;  . TOTAL KNEE ARTHROPLASTY Left 06/06/2020   Procedure: LEFT TOTAL KNEE ARTHROPLASTY;  Surgeon: Leandrew Koyanagi, MD;  Location: [place];  Service: Orthopedics;  Laterality: Left;  . TUBAL LIGATION      There were no vitals filed for this visit.   Subjective Assessment - 09/15/20 1441    Subjective Pt. stated feeling pain at most around 4/10 in last 2 weeks.  Still taking medicine at times.  Pt. stated having stiffness and some weakness at times.  Reported walking independent at this time.    Pertinent History PMH: lumbar radicupathy, advanced Lt knee OA, Rt TKA 2019,HTN,OSA on CPAP    Limitations Walking;House hold activities;Sitting;Lifting;Standing    Currently in Pain? No/denies    Pain Onset More than a month ago              [facility] PT Assessment - 09/15/20 0001      Assessment   Medical Diagnosis Lt TKA    Referring Provider (PT) Dr. Erlinda Hong    Onset Date/Surgical Date 06/06/20      Observation/Other Assessments   Focus on Therapeutic Outcomes (FOTO)  update 67%      Single Leg Stance   Comments Lt SLS 7 seconds, Rt SLS 9 seconds      AROM   Left Knee Extension 0    Left Knee Flexion 126      Strength   Left Knee Flexion 5/5  Left Knee Extension 5/5      Ambulation/Gait   Gait Comments Independent ambulation                         [facility] Adult PT Treatment/Exercise - 09/15/20 0001      Neuro Re-ed    Neuro Re-ed Details  tandem stance 1 min each LE post, SLS at bar c occasional HHA 30 sec x 4 bilateral      Knee/Hip Exercises: Aerobic   Recumbent Bike Lvl 3 11 mins seat 6      Knee/Hip Exercises: Machines for Strengthening   Total Gym Leg Press SL 62 lbs 3 x 10, performed bilateral      Knee/Hip Exercises: Seated   Sit to Sand without UE support;10 reps                  PT Education - 09/15/20 1441    Education Details Review of HEP, discussion about readiness for D/C based off clinical observations.    Person(s) Educated Patient    Methods Explanation;Demonstration;Verbal cues;Handout    Comprehension Returned  demonstration;Verbalized understanding            PT Short Term Goals - 08/09/20 1031      PT SHORT TERM GOAL #1   Title Patient will demonstrate independent use of home exercise program to maintain progress from in clinic treatments.    Status Partially Met             PT Long Term Goals - 09/15/20 1455      PT LONG TERM GOAL #1   Title Patient will demonstrate/report pain at worst less than or equal to 2/10 to facilitate minimal limitation in daily activity secondary to pain symptoms.    Status On-going      PT LONG TERM GOAL #2   Title Patient will demonstrate independent use of home exercise program to facilitate ability to maintain/progress functional gains from skilled physical therapy services.    Status Achieved      PT LONG TERM GOAL #3   Title Patient will demonstrate Lt knee AROM 0-110 degrees to facilitate ability to perform transfers, sitting, ambulation, stair navigation s restriction due to mobility.    Status Achieved      PT LONG TERM GOAL #4   Title Patient will demonstrate Lt LE MMT 5/5 throughout to facilitate ability to perform usual standing, walking, stairs at PLOF s limitation due to symptoms.    Status Achieved      PT LONG TERM GOAL #5   Title Pt. will demonstrate independent ambulation community distances > 300 ft for usual ambulation mobility.    Status Achieved                 Plan - 09/15/20 1455    Clinical Impression Statement Pt. has attended 16 visits overall during course of treatment.  See objective data for updated information.  Current presentation shows reaching goals for strength and mobility, as well as independent ambulation at this time.  Pt. may continue to benefit from HEP use going forward to continue to improve and manage symptoms and daily tightness in morning.  Pt. is appropriate for D/C to HEP at this time due to progress and current presentation.    Personal Factors and Comorbidities Comorbidity 3+    Comorbidities  PMH: advanced Lt knee OA, Rt TKA 2019,HTN,OSA on CPAP    Examination-Activity Limitations Bend;Carry;Dressing;Lift;Stand;Stairs;Squat;Locomotion Level;Transfers;Sit;Sleep;Bed Mobility;Bathing;Hygiene/Grooming  Examination-Participation Restrictions Cleaning;Community Activity;Laundry;Shop;Meal Prep    Stability/Clinical Decision Making Evolving/Moderate complexity    Rehab Potential Good    PT Frequency 2x / week    PT Duration 6 weeks    PT Treatment/Interventions ADLs/Self Care Home Management;Cryotherapy;Electrical Stimulation;Iontophoresis 53m/ml Dexamethasone;Moist Heat;Traction;Parrafin;Gait training;Stair training;Therapeutic activities;Therapeutic exercise;Balance training;Neuromuscular re-education;Manual techniques;Dry needling;Passive range of motion;Joint Manipulations;Spinal Manipulations;Taping;Vasopneumatic Device;Patient/family education    PT Next Visit Plan Recommend D/C to HEP.    PT Home Exercise Plan [email or web address]    Consulted and Agree with Plan of Care Patient           Patient will benefit from skilled therapeutic intervention in order to improve the following deficits and impairments:  Abnormal gait,Decreased activity tolerance,Decreased balance,Decreased endurance,Decreased range of motion,Decreased mobility,Decreased strength,Hypomobility,Increased edema,Difficulty walking,Impaired flexibility,Increased muscle spasms,Increased fascial restricitons,Pain,Improper body mechanics,Postural dysfunction  Visit Diagnosis: Chronic pain of left knee  Muscle weakness (generalized)  Difficulty in walking, not elsewhere classified  Stiffness of left knee, not elsewhere classified  Localized edema     Problem List Patient Active Problem List   Diagnosis Date Noted  . Status post total left knee replacement 06/06/2020  . Positive ANA (antinuclear antibody) 04/25/2020  . Primary osteoarthritis of left knee 04/14/2020  . Lumbar radiculopathy 06/26/2019  . Left-sided low  back pain with left-sided sciatica 06/26/2019  . Thoracic spinal stenosis 04/18/2019  . Seasonal allergies 12/04/2018  . Allergic rhinoconjunctivitis 10/10/2018  . Moderate persistent asthma with acute exacerbation 10/10/2018  . Pruritus 10/10/2018  . Aortic valve sclerosis 10/06/2018  . Bilateral carotid bruits 10/06/2018  . Abnormal stress ECG 10/06/2018  . Laboratory examination 10/06/2018  . Palpitations 10/06/2018  . Total knee replacement status 09/19/2017  . Sprain of anterior talofibular ligament of right ankle 12/13/2016    MScot Jun PT, DPT, OCS, ATC 09/15/20  3:13 PM    [facility] [address] [address]Phone: [phone]  Fax:  [phone] Name: Rachel Crane: [id number] of Birth: 3March 12, 1947

## 2020-09-15 NOTE — Patient Instructions (Signed)
Access Code: [email or web address] URL: [email or web address] Date: 09/15/2020 Prepared by: Scot Jun  Exercises Small Range Straight Leg Raise - 1 x daily - 7 x weekly - 3 sets - 10 reps Seated Long Arc Quad - 2 x daily - 7 x weekly - 3 sets - 10 reps - 2 hold Supine Heel Slide - 2 x daily - 7 x weekly - 3 sets - 10 reps - 2 hold Supine Knee Extension Mobilization with Weight - 4 x daily - 7 x weekly - 1 sets - 1 reps - up to 15 mins hold Sit to Stand - 1 x daily - 7 x weekly - 3 sets - 10 reps Seated Straight Leg Heel Taps - 1 x daily - 7 x weekly - 3 sets - 10 reps Single Leg Stance - 1 x daily - 7 x weekly - 1 sets - 5 reps - 20-30 hold Tandem Stance - 1 x daily - 7 x weekly - 1 sets - 5 reps - 20-30 hold

## 2020-09-19 DIAGNOSIS — H401131 Primary open-angle glaucoma, bilateral, mild stage: Secondary | ICD-10-CM | POA: Diagnosis not present

## 2020-09-20 ENCOUNTER — Telehealth: Payer: Self-pay | Admitting: Orthopaedic Surgery

## 2020-09-20 ENCOUNTER — Encounter: Payer: Self-pay | Admitting: Orthopaedic Surgery

## 2020-09-20 ENCOUNTER — Ambulatory Visit (INDEPENDENT_AMBULATORY_CARE_PROVIDER_SITE_OTHER): Payer: Medicare Other | Admitting: Orthopaedic Surgery

## 2020-09-20 VITALS — BP 124/61 | Ht 66.0 in | Wt 211.0 lb

## 2020-09-20 DIAGNOSIS — Z96652 Presence of left artificial knee joint: Secondary | ICD-10-CM

## 2020-09-20 MED ORDER — IBUPROFEN 800 MG PO TABS: 800.0000 mg | ORAL_TABLET | Freq: Three times a day (TID) | ORAL | 2 refills | Status: DC | PRN

## 2020-09-20 MED ORDER — TRAMADOL HCL 50 MG PO TABS: 50.0000 mg | ORAL_TABLET | Freq: Every day | ORAL | 0 refills | Status: DC | PRN

## 2020-09-20 NOTE — Progress Notes (Signed)
Post-Op Visit Note   Patient: Rachel Crane           Date of Birth: Mar 20, 1946           MRN: [id number] Visit Date: 09/20/2020 PCP: Leeroy Cha, MD   Assessment & Plan:  Chief Complaint:  Chief Complaint  Patient presents with   Left Knee - Follow-up    Left total knee arthroplasty 06/06/20   Visit Diagnoses:  1. Status post total left knee replacement     Plan:   [name] is 61-month status post left total knee replacement.  She has completed physical therapy and doing just home exercises.  Takes tramadol ibuprofen as needed.  She is able to do household chores.  Left knee shows a fully healed surgical scar.  Range of motion 0 to 126 degrees.  No signs of infection.  Mild swelling around the knee.  At this point she can return back to work with restrictions for no lifting or prolonged standing.  She would like to make an appointment with Dr. Junius Roads for repeat right shoulder injection.  We will see her back in 3 months with two-view x-rays of the left knee.  Dental prophylaxis reinforced.  Follow-Up Instructions: Return in about 3 months (around 12/21/2020).   Orders:  No orders of the defined types were placed in this encounter.  Meds ordered this encounter  Medications   traMADol (ULTRAM) 50 MG tablet    Sig: Take 1 tablet (50 mg total) by mouth daily as needed.    Dispense:  30 tablet    Refill:  0   ibuprofen (ADVIL) 800 MG tablet    Sig: Take 1 tablet (800 mg total) by mouth every 8 (eight) hours as needed.    Dispense:  30 tablet    Refill:  2    Imaging: No results found.  PMFS History: Patient Active Problem List   Diagnosis Date Noted   Status post total left knee replacement 06/06/2020   Positive ANA (antinuclear antibody) 04/25/2020   Primary osteoarthritis of left knee 04/14/2020   Lumbar radiculopathy 06/26/2019   Left-sided low back pain with left-sided sciatica 06/26/2019   Thoracic spinal stenosis 04/18/2019   Seasonal  allergies 12/04/2018   Allergic rhinoconjunctivitis 10/10/2018   Moderate persistent asthma with acute exacerbation 10/10/2018   Pruritus 10/10/2018   Aortic valve sclerosis 10/06/2018   Bilateral carotid bruits 10/06/2018   Abnormal stress ECG 10/06/2018   Laboratory examination 10/06/2018   Palpitations 10/06/2018   Total knee replacement status 09/19/2017   Sprain of anterior talofibular ligament of right ankle 12/13/2016   Past Medical History:  Diagnosis Date   Abnormal stress ECG 10/06/2018   Aortic valve sclerosis 10/06/2018   Arthritis    Asthma    Bilateral carotid bruits 10/06/2018   Headache    H/O bad HA, since using CPAP- she has had improvement with that issue    Hypertension    Laboratory examination 10/06/2018   OSA on CPAP    settting - 8, uses CPAP q night    Pre-diabetes    Shingles     Family History  Problem Relation Age of Onset   Breast cancer Sister    Hypertension Mother    Arthritis Mother     Past Surgical History:  Procedure Laterality Date   CHOLECYSTECTOMY  1998   TOTAL KNEE ARTHROPLASTY Right 09/19/2017   Procedure: RIGHT TOTAL KNEE ARTHROPLASTY;  Surgeon: Leandrew Koyanagi, MD;  Location: [place];  Service:  Orthopedics;  Laterality: Right;   TOTAL KNEE ARTHROPLASTY Left 06/06/2020   Procedure: LEFT TOTAL KNEE ARTHROPLASTY;  Surgeon: Leandrew Koyanagi, MD;  Location: [place];  Service: Orthopedics;  Laterality: Left;   TUBAL LIGATION     Social History   Occupational History   Not on file  Tobacco Use   Smoking status: Never Smoker   Smokeless tobacco: Never Used  Vaping Use   Vaping Use: Never used  Substance and Sexual Activity   Alcohol use: Not Currently   Drug use: No   Sexual activity: Not on file

## 2020-09-20 NOTE — Telephone Encounter (Signed)
Patient came in requesting to refax for FMLA. Pt received denial due to missing documents for doctor's name, address, phone number, and signature. Spoke with Alyse Low from [employer] refaxed to [employer] number [phone] on date 09/20/20

## 2020-10-04 ENCOUNTER — Ambulatory Visit: Payer: Medicare Other | Admitting: Family Medicine

## 2020-10-14 ENCOUNTER — Ambulatory Visit (HOSPITAL_COMMUNITY)
Admission: EM | Admit: 2020-10-14 | Discharge: 2020-10-14 | Disposition: A | Discharge status: Home / Self Care | Payer: Medicare Other | Attending: Internal Medicine | Admitting: Internal Medicine

## 2020-10-14 ENCOUNTER — Telehealth: Payer: Self-pay

## 2020-10-14 ENCOUNTER — Other Ambulatory Visit: Payer: Self-pay | Admitting: Physician Assistant

## 2020-10-14 ENCOUNTER — Other Ambulatory Visit: Payer: Self-pay

## 2020-10-14 ENCOUNTER — Encounter (HOSPITAL_COMMUNITY): Payer: Self-pay | Admitting: Emergency Medicine

## 2020-10-14 DIAGNOSIS — Z20822 Contact with and (suspected) exposure to covid-19: Secondary | ICD-10-CM | POA: Diagnosis not present

## 2020-10-14 LAB — SARS CORONAVIRUS 2 (TAT 6-24 HRS): SARS Coronavirus 2: NEGATIVE

## 2020-10-14 MED ORDER — TRAMADOL HCL 50 MG PO TABS: 50.0000 mg | ORAL_TABLET | Freq: Every day | ORAL | 0 refills | Status: DC | PRN

## 2020-10-14 NOTE — Telephone Encounter (Signed)
Pt came in and would like a refill on her medication and her pain medication  Pt is going out of town Sunday and would like to pick it up before then

## 2020-10-14 NOTE — Discharge Instructions (Signed)

## 2020-10-14 NOTE — ED Triage Notes (Signed)
Pt presents for COVID test for travel. Denies any symptoms at this time.

## 2020-10-14 NOTE — Telephone Encounter (Signed)
Sent in tramadol

## 2020-10-31 ENCOUNTER — Ambulatory Visit: Payer: Medicare Other | Admitting: Cardiology

## 2020-11-01 ENCOUNTER — Telehealth: Payer: Self-pay | Admitting: Orthopaedic Surgery

## 2020-11-01 MED ORDER — TRAMADOL HCL 50 MG PO TABS: 50.0000 mg | ORAL_TABLET | Freq: Every day | ORAL | 0 refills | Status: DC | PRN

## 2020-11-01 MED ORDER — IBUPROFEN 800 MG PO TABS: 800.0000 mg | ORAL_TABLET | Freq: Two times a day (BID) | ORAL | 6 refills | Status: DC | PRN

## 2020-11-01 NOTE — Telephone Encounter (Signed)
Pt called stating she needs her tramadol 50 mg and ibuprofen 800 mg called in; pt would like to be notified as soon as this is sent in as she doesn't have anymore left.   [phone]

## 2020-11-01 NOTE — Telephone Encounter (Signed)
done

## 2020-11-08 ENCOUNTER — Ambulatory Visit: Payer: Medicare Other | Admitting: Orthopaedic Surgery

## 2020-11-18 ENCOUNTER — Other Ambulatory Visit: Payer: Self-pay | Admitting: Physician Assistant

## 2020-11-22 ENCOUNTER — Ambulatory Visit (INDEPENDENT_AMBULATORY_CARE_PROVIDER_SITE_OTHER): Payer: Medicare Other | Admitting: Orthopaedic Surgery

## 2020-11-22 DIAGNOSIS — M5442 Lumbago with sciatica, left side: Secondary | ICD-10-CM

## 2020-11-22 DIAGNOSIS — Z96652 Presence of left artificial knee joint: Secondary | ICD-10-CM | POA: Diagnosis not present

## 2020-11-22 DIAGNOSIS — G8929 Other chronic pain: Secondary | ICD-10-CM | POA: Diagnosis not present

## 2020-11-22 DIAGNOSIS — M5441 Lumbago with sciatica, right side: Secondary | ICD-10-CM | POA: Diagnosis not present

## 2020-11-22 MED ORDER — ACETAMINOPHEN-CODEINE #3 300-30 MG PO TABS: 1.0000 | ORAL_TABLET | Freq: Every day | ORAL | 0 refills | Status: AC | PRN

## 2020-11-22 MED ORDER — BUPIVACAINE HCL 0.5 % IJ SOLN
3.0000 mL | INTRAMUSCULAR | Status: AC | PRN
  Administered 2020-11-22: 3 mL via INTRA_ARTICULAR

## 2020-11-22 MED ORDER — METHYLPREDNISOLONE ACETATE 40 MG/ML IJ SUSP
40.0000 mg | INTRAMUSCULAR | Status: AC | PRN
  Administered 2020-11-22: 40 mg via INTRA_ARTICULAR

## 2020-11-22 MED ORDER — LIDOCAINE HCL 1 % IJ SOLN
3.0000 mL | INTRAMUSCULAR | Status: AC | PRN
Start: 2020-11-22 — End: 2020-11-22
  Administered 2020-11-22: 3 mL

## 2020-11-22 MED ORDER — LIDOCAINE HCL 1 % IJ SOLN
3.0000 mL | INTRAMUSCULAR | Status: AC | PRN
  Administered 2020-11-22: 3 mL

## 2020-11-22 NOTE — Progress Notes (Signed)
Office Visit Note   Patient: Rachel Crane           Date of Birth: Feb 26, 1946           MRN: [id number] Visit Date: 11/22/2020              Requested by: Leeroy Cha, MD [address] [place],  [place] [address] PCP: Leeroy Cha, MD   Assessment & Plan: Visit Diagnoses:  1. Status post total left knee replacement   2. Chronic left-sided low back pain with left-sided sciatica     Plan: Bilateral subacromial injections performed today.  She has mild rotator cuff arthropathy.  She tolerated the injections well.  We made a referral to Dr. Assunta Curtis for repeat lumbar spine ESI as she has had good relief from his injections in the past.  Prescription sent in for Tylenol with codeine.  Follow-Up Instructions: Return if symptoms worsen or fail to improve.   Orders:  No orders of the defined types were placed in this encounter.  Meds ordered this encounter  Medications  . acetaminophen-codeine (TYLENOL #3) 300-30 MG tablet    Sig: Take 1-2 tablets by mouth daily as needed for up to 7 days for moderate pain.    Dispense:  30 tablet    Refill:  0      Procedures: Large Joint Inj: bilateral subacromial bursa on 11/22/2020 4:40 PM Indications: pain Details: 22 G needle Medications (Right): 3 mL lidocaine 1 %; 3 mL bupivacaine 0.5 %; 40 mg methylPREDNISolone acetate 40 MG/ML Medications (Left): 3 mL lidocaine 1 %; 3 mL bupivacaine 0.5 %; 40 mg methylPREDNISolone acetate 40 MG/ML Outcome: tolerated well, no immediate complications Patient was prepped and draped in the usual sterile fashion.       Clinical Data: No additional findings.   Subjective: Chief Complaint  Patient presents with  . Other    "I hurt all over"    Rachel Crane is a 75 year old female who follows up for bilateral shoulder pain.  Also complaining of low back pain as well.  She is requesting referral to Dr. Assunta Curtis for another [facility].  She has chronic bilateral shoulder pain.  She has  rotator cuff syndrome bilaterally.   Review of Systems  Constitutional: Negative.   HENT: Negative.   Eyes: Negative.   Respiratory: Negative.   Cardiovascular: Negative.   Endocrine: Negative.   Musculoskeletal: Negative.   Neurological: Negative.   Hematological: Negative.   Psychiatric/Behavioral: Negative.   All other systems reviewed and are negative.    Objective: Vital Signs: There were no vitals taken for this visit.  Physical Exam Vitals and nursing note reviewed.  Constitutional:      Appearance: She is well-developed.  Pulmonary:     Effort: Pulmonary effort is normal.  Skin:    General: Skin is warm.     Capillary Refill: Capillary refill takes less than 2 seconds.  Neurological:     Mental Status: She is alert and oriented to person, place, and time.  Psychiatric:        Behavior: Behavior normal.        Thought Content: Thought content normal.        Judgment: Judgment normal.     Ortho Exam Bilateral shoulder exams are unchanged. Specialty Comments:  No specialty comments available.  Imaging: No results found.   PMFS History: Patient Active Problem List   Diagnosis Date Noted  . Status post total left knee replacement 06/06/2020  . Positive  ANA (antinuclear antibody) 04/25/2020  . Primary osteoarthritis of left knee 04/14/2020  . Lumbar radiculopathy 06/26/2019  . Left-sided low back pain with left-sided sciatica 06/26/2019  . Thoracic spinal stenosis 04/18/2019  . Seasonal allergies 12/04/2018  . Allergic rhinoconjunctivitis 10/10/2018  . Moderate persistent asthma with acute exacerbation 10/10/2018  . Pruritus 10/10/2018  . Aortic valve sclerosis 10/06/2018  . Bilateral carotid bruits 10/06/2018  . Abnormal stress ECG 10/06/2018  . Laboratory examination 10/06/2018  . Palpitations 10/06/2018  . Total knee replacement status 09/19/2017  . Sprain of anterior talofibular ligament of right ankle 12/13/2016   Past Medical History:   Diagnosis Date  . Abnormal stress ECG 10/06/2018  . Aortic valve sclerosis 10/06/2018  . Arthritis   . Asthma   . Bilateral carotid bruits 10/06/2018  . Headache    H/O bad HA, since using CPAP- she has had improvement with that issue   . Hypertension   . Laboratory examination 10/06/2018  . OSA on CPAP    settting - 8, uses CPAP q night   . Pre-diabetes   . Shingles     Family History  Problem Relation Age of Onset  . Breast cancer Sister   . Hypertension Mother   . Arthritis Mother     Past Surgical History:  Procedure Laterality Date  . CHOLECYSTECTOMY  1998  . TOTAL KNEE ARTHROPLASTY Right 09/19/2017   Procedure: RIGHT TOTAL KNEE ARTHROPLASTY;  Surgeon: Leandrew Koyanagi, MD;  Location: [facility];  Service: Orthopedics;  Laterality: Right;  . TOTAL KNEE ARTHROPLASTY Left 06/06/2020   Procedure: LEFT TOTAL KNEE ARTHROPLASTY;  Surgeon: Leandrew Koyanagi, MD;  Location: [facility];  Service: Orthopedics;  Laterality: Left;  . TUBAL LIGATION     Social History   Occupational History  . Not on file  Tobacco Use  . Smoking status: Never Smoker  . Smokeless tobacco: Never Used  Vaping Use  . Vaping Use: Never used  Substance and Sexual Activity  . Alcohol use: Not Currently  . Drug use: No  . Sexual activity: Not on file

## 2020-11-25 ENCOUNTER — Other Ambulatory Visit: Payer: Self-pay

## 2020-11-25 DIAGNOSIS — G8929 Other chronic pain: Secondary | ICD-10-CM

## 2020-12-01 ENCOUNTER — Telehealth: Payer: Self-pay

## 2020-12-01 ENCOUNTER — Other Ambulatory Visit: Payer: Self-pay

## 2020-12-01 DIAGNOSIS — G8929 Other chronic pain: Secondary | ICD-10-CM

## 2020-12-01 DIAGNOSIS — Z96652 Presence of left artificial knee joint: Secondary | ICD-10-CM

## 2020-12-01 NOTE — Telephone Encounter (Signed)
Referral made 

## 2020-12-01 NOTE — Telephone Encounter (Signed)
Patient called concerning a referral for pain management.  Stated that she went by the office and was advised that they had not received a referral.  Would like a call back?  Stated that she is in a lot of pain.  CB# [phone].  Please advise.

## 2020-12-01 NOTE — Telephone Encounter (Signed)
yes

## 2020-12-01 NOTE — Telephone Encounter (Signed)
Okay on pain clinic referral?

## 2020-12-20 ENCOUNTER — Ambulatory Visit: Payer: Medicare Other | Admitting: Orthopaedic Surgery

## 2020-12-30 DIAGNOSIS — I10 Essential (primary) hypertension: Secondary | ICD-10-CM | POA: Diagnosis not present

## 2020-12-30 DIAGNOSIS — M48062 Spinal stenosis, lumbar region with neurogenic claudication: Secondary | ICD-10-CM | POA: Diagnosis not present

## 2020-12-30 DIAGNOSIS — Z6831 Body mass index (BMI) 31.0-31.9, adult: Secondary | ICD-10-CM | POA: Diagnosis not present

## 2021-01-03 ENCOUNTER — Ambulatory Visit: Payer: Medicare Other | Admitting: Orthopaedic Surgery

## 2021-01-10 ENCOUNTER — Ambulatory Visit (INDEPENDENT_AMBULATORY_CARE_PROVIDER_SITE_OTHER): Payer: Medicare Other | Admitting: Orthopaedic Surgery

## 2021-01-10 ENCOUNTER — Telehealth: Payer: Self-pay

## 2021-01-10 ENCOUNTER — Encounter: Payer: Self-pay | Admitting: Orthopaedic Surgery

## 2021-01-10 ENCOUNTER — Ambulatory Visit (INDEPENDENT_AMBULATORY_CARE_PROVIDER_SITE_OTHER): Payer: Medicare Other

## 2021-01-10 DIAGNOSIS — Z96652 Presence of left artificial knee joint: Secondary | ICD-10-CM

## 2021-01-10 DIAGNOSIS — M25512 Pain in left shoulder: Secondary | ICD-10-CM

## 2021-01-10 DIAGNOSIS — G8929 Other chronic pain: Secondary | ICD-10-CM

## 2021-01-10 NOTE — Progress Notes (Signed)
Office Visit Note   Patient: Rachel Crane           Date of Birth: [birth year]           MRN: [id number] Visit Date: 01/10/2021              Requested by: Leeroy Cha, MD [address] [place],  [place] [address] PCP: Leeroy Cha, MD   Assessment & Plan: Visit Diagnoses:  1. Status post total left knee replacement   2. Chronic left shoulder pain     Plan: In terms of the left shoulder she continues to have pain despite conservative treatment therefore we will order an MRI to assess for structural abnormalities.  In terms of the left knee the x-rays are reassuring.  My sense is that it is related to hypersensitivity of her sensory nerves that could be caused from residual inflammation.  I do not feel that the implant is compromised in any way.  I recommended giving this more time to let her body heal from the surgery.  We will see her back after the MRI of the left shoulder.  Follow-Up Instructions: Return in about 6 months (around 07/12/2021) for follow up after left shoulder MRI.   Orders:  Orders Placed This Encounter  Procedures   XR KNEE 3 VIEW LEFT   MR Shoulder Left w/o contrast   No orders of the defined types were placed in this encounter.     Procedures: No procedures performed   Clinical Data: No additional findings.   Subjective: Chief Complaint  Patient presents with   Left Knee - Routine Post Op   Lower Back - Pain, Follow-up    HPI  Review of Systems  Constitutional: Negative.   HENT: Negative.    Eyes: Negative.   Respiratory: Negative.    Cardiovascular: Negative.   Endocrine: Negative.   Musculoskeletal: Negative.   Neurological: Negative.   Hematological: Negative.   Psychiatric/Behavioral: Negative.    All other systems reviewed and are negative.   Objective: Vital Signs: There were no vitals taken for this visit.  Physical Exam Vitals and nursing note reviewed.  Constitutional:      Appearance:  She is well-developed.  Pulmonary:     Effort: Pulmonary effort is normal.  Skin:    General: Skin is warm.     Capillary Refill: Capillary refill takes less than 2 seconds.  Neurological:     Mental Status: She is alert and oriented to person, place, and time.  Psychiatric:        Behavior: Behavior normal.        Thought Content: Thought content normal.        Judgment: Judgment normal.    Ortho Exam Left shoulder exam is unchanged. Left knee shows full healed surgical scar.  She is quite sensitive to gentle touch throughout the skin.  There is no evidence of infection.  Implant feels stable.  Trace effusion.  Range of motion is normal. Specialty Comments:  No specialty comments available.  Imaging: XR KNEE 3 VIEW LEFT  Result Date: 01/10/2021 Stable total knee replacement without complication    PMFS History: Patient Active Problem List   Diagnosis Date Noted   Status post total left knee replacement 06/06/2020   Positive ANA (antinuclear antibody) 04/25/2020   Primary osteoarthritis of left knee 04/14/2020   Lumbar radiculopathy 06/26/2019   Left-sided low back pain with left-sided sciatica 06/26/2019   Thoracic spinal stenosis 04/18/2019   Seasonal  allergies 12/04/2018   Allergic rhinoconjunctivitis 10/10/2018   Moderate persistent asthma with acute exacerbation 10/10/2018   Pruritus 10/10/2018   Aortic valve sclerosis 10/06/2018   Bilateral carotid bruits 10/06/2018   Abnormal stress ECG 10/06/2018   Laboratory examination 10/06/2018   Palpitations 10/06/2018   Total knee replacement status 09/19/2017   Sprain of anterior talofibular ligament of right ankle 12/13/2016   Past Medical History:  Diagnosis Date   Abnormal stress ECG 10/06/2018   Aortic valve sclerosis 10/06/2018   Arthritis    Asthma    Bilateral carotid bruits 10/06/2018   Headache    H/O bad HA, since using CPAP- she has had improvement with that issue    Hypertension    Laboratory  examination 10/06/2018   OSA on CPAP    settting - 8, uses CPAP q night    Pre-diabetes    Shingles     Family History  Problem Relation Age of Onset   Breast cancer Sister    Hypertension Mother    Arthritis Mother     Past Surgical History:  Procedure Laterality Date   CHOLECYSTECTOMY  1998   TOTAL KNEE ARTHROPLASTY Right 09/19/2017   Procedure: RIGHT TOTAL KNEE ARTHROPLASTY;  Surgeon: Leandrew Koyanagi, MD;  Location: [facility];  Service: Orthopedics;  Laterality: Right;   TOTAL KNEE ARTHROPLASTY Left 06/06/2020   Procedure: LEFT TOTAL KNEE ARTHROPLASTY;  Surgeon: Leandrew Koyanagi, MD;  Location: [facility];  Service: Orthopedics;  Laterality: Left;   TUBAL LIGATION     Social History   Occupational History   Not on file  Tobacco Use   Smoking status: Never   Smokeless tobacco: Never  Vaping Use   Vaping Use: Never used  Substance and Sexual Activity   Alcohol use: Not Currently   Drug use: No   Sexual activity: Not on file

## 2021-01-10 NOTE — Telephone Encounter (Signed)
Patient was seen in our office today she is requesting a back brace call [phone]

## 2021-01-11 DIAGNOSIS — M1712 Unilateral primary osteoarthritis, left knee: Secondary | ICD-10-CM | POA: Diagnosis not present

## 2021-01-11 DIAGNOSIS — E1169 Type 2 diabetes mellitus with other specified complication: Secondary | ICD-10-CM | POA: Diagnosis not present

## 2021-01-11 DIAGNOSIS — Z7984 Long term (current) use of oral hypoglycemic drugs: Secondary | ICD-10-CM | POA: Diagnosis not present

## 2021-01-11 DIAGNOSIS — I10 Essential (primary) hypertension: Secondary | ICD-10-CM | POA: Diagnosis not present

## 2021-01-11 NOTE — Telephone Encounter (Signed)
yes

## 2021-01-12 NOTE — Telephone Encounter (Signed)
Called patient no answer. LMOM.  Per Dr. Erlinda Hong she is able to get back brace. She can get this over the counter at any pharm or medical supply store or she can come get one in our office. She will let us know what she would like to do.

## 2021-01-17 DIAGNOSIS — Z20822 Contact with and (suspected) exposure to covid-19: Secondary | ICD-10-CM | POA: Diagnosis not present

## 2021-01-20 ENCOUNTER — Other Ambulatory Visit: Payer: Self-pay

## 2021-01-20 ENCOUNTER — Ambulatory Visit
Admission: RE | Admit: 2021-01-20 | Discharge: 2021-01-20 | Disposition: A | Discharge status: Home / Self Care | Payer: Medicare Other | Source: Ambulatory Visit | Attending: Orthopaedic Surgery | Admitting: Orthopaedic Surgery

## 2021-01-20 DIAGNOSIS — M25512 Pain in left shoulder: Secondary | ICD-10-CM

## 2021-01-20 DIAGNOSIS — M75122 Complete rotator cuff tear or rupture of left shoulder, not specified as traumatic: Secondary | ICD-10-CM | POA: Diagnosis not present

## 2021-01-20 DIAGNOSIS — G8929 Other chronic pain: Secondary | ICD-10-CM

## 2021-01-20 DIAGNOSIS — M25412 Effusion, left shoulder: Secondary | ICD-10-CM | POA: Diagnosis not present

## 2021-01-30 DIAGNOSIS — E1169 Type 2 diabetes mellitus with other specified complication: Secondary | ICD-10-CM | POA: Diagnosis not present

## 2021-01-30 DIAGNOSIS — I10 Essential (primary) hypertension: Secondary | ICD-10-CM | POA: Diagnosis not present

## 2021-01-31 ENCOUNTER — Other Ambulatory Visit: Payer: Self-pay

## 2021-01-31 ENCOUNTER — Ambulatory Visit (INDEPENDENT_AMBULATORY_CARE_PROVIDER_SITE_OTHER): Payer: Medicare Other | Admitting: Orthopaedic Surgery

## 2021-01-31 ENCOUNTER — Encounter: Payer: Self-pay | Admitting: Orthopaedic Surgery

## 2021-01-31 DIAGNOSIS — M545 Low back pain, unspecified: Secondary | ICD-10-CM

## 2021-01-31 DIAGNOSIS — Z96652 Presence of left artificial knee joint: Secondary | ICD-10-CM

## 2021-01-31 DIAGNOSIS — G8929 Other chronic pain: Secondary | ICD-10-CM | POA: Diagnosis not present

## 2021-01-31 DIAGNOSIS — M25512 Pain in left shoulder: Secondary | ICD-10-CM

## 2021-01-31 NOTE — Progress Notes (Signed)
Patient: Rachel Crane           Date of Birth: 10/15/1945           MRN: [id number] Visit Date: 01/31/2021 PCP: Leeroy Cha, MD   Assessment & Plan:  Chief Complaint:  Chief Complaint  Patient presents with   Left Shoulder - Pain   Visit Diagnoses:  1. Status post total left knee replacement   2. Chronic left shoulder pain   3. Low back pain, unspecified back pain laterality, unspecified chronicity, unspecified whether sciatica present     Plan: Rachel Crane returns today for follow-up of recent left shoulder MRI.  Also continues to complain of low back pain and left knee pain.  Examination of the left shoulder shows positive empty can with weakness as well as pain with resisted external rotation and weakness.  Abduction with pain and weakness to about 90 degrees.  MRI shows chronically retracted tears of the supraspinatus and infraspinatus with muscle atrophy and high riding humeral head.  This is consistent with rotator cuff arthropathy.  This was all reviewed with the patient in detail and treatment options were discussed to include physical therapy and cortisone injection versus shoulder replacement surgery.  She is currently not interested in any surgeries right now would like to do a course of formal outpatient physical therapy for her back and the shoulder.  She also requested referral back to Dr. Lynann Bologna for her back.  We will see her back as needed.  Total face to face encounter time was greater than 25 minutes and over half of this time was spent in counseling and/or coordination of care.  Follow-Up Instructions: Return if symptoms worsen or fail to improve.   Orders:  Orders Placed This Encounter  Procedures   Ambulatory referral to Physical Therapy   Ambulatory referral to Orthopedic Surgery   No orders of the defined types were placed in this encounter.   Imaging: No results found.  PMFS History: Patient Active Problem List   Diagnosis Date Noted    Status post total left knee replacement 06/06/2020   Positive ANA (antinuclear antibody) 04/25/2020   Primary osteoarthritis of left knee 04/14/2020   Lumbar radiculopathy 06/26/2019   Left-sided low back pain with left-sided sciatica 06/26/2019   Thoracic spinal stenosis 04/18/2019   Seasonal allergies 12/04/2018   Allergic rhinoconjunctivitis 10/10/2018   Moderate persistent asthma with acute exacerbation 10/10/2018   Pruritus 10/10/2018   Aortic valve sclerosis 10/06/2018   Bilateral carotid bruits 10/06/2018   Abnormal stress ECG 10/06/2018   Laboratory examination 10/06/2018   Palpitations 10/06/2018   Total knee replacement status 09/19/2017   Sprain of anterior talofibular ligament of right ankle 12/13/2016   Past Medical History:  Diagnosis Date   Abnormal stress ECG 10/06/2018   Aortic valve sclerosis 10/06/2018   Arthritis    Asthma    Bilateral carotid bruits 10/06/2018   Headache    H/O bad HA, since using CPAP- she has had improvement with that issue    Hypertension    Laboratory examination 10/06/2018   OSA on CPAP    settting - 8, uses CPAP q night    Pre-diabetes    Shingles     Family History  Problem Relation Age of Onset   Breast cancer Sister    Hypertension Mother    Arthritis Mother     Past Surgical History:  Procedure Laterality Date   CHOLECYSTECTOMY  1998   TOTAL KNEE ARTHROPLASTY Right 09/19/2017  Procedure: RIGHT TOTAL KNEE ARTHROPLASTY;  Surgeon: Leandrew Koyanagi, MD;  Location: [facility];  Service: Orthopedics;  Laterality: Right;   TOTAL KNEE ARTHROPLASTY Left 06/06/2020   Procedure: LEFT TOTAL KNEE ARTHROPLASTY;  Surgeon: Leandrew Koyanagi, MD;  Location: [facility];  Service: Orthopedics;  Laterality: Left;   TUBAL LIGATION     Social History   Occupational History   Not on file  Tobacco Use   Smoking status: Never   Smokeless tobacco: Never  Vaping Use   Vaping Use: Never used  Substance and Sexual Activity   Alcohol use: Not Currently    Drug use: No   Sexual activity: Not on file

## 2021-02-15 ENCOUNTER — Other Ambulatory Visit: Payer: Self-pay

## 2021-02-15 ENCOUNTER — Ambulatory Visit (INDEPENDENT_AMBULATORY_CARE_PROVIDER_SITE_OTHER): Payer: Medicare Other | Admitting: Rehabilitative and Restorative Service Providers"

## 2021-02-15 ENCOUNTER — Ambulatory Visit: Payer: Medicare Other | Admitting: Rehabilitative and Restorative Service Providers"

## 2021-02-15 ENCOUNTER — Encounter: Payer: Self-pay | Admitting: Rehabilitative and Restorative Service Providers"

## 2021-02-15 DIAGNOSIS — G8929 Other chronic pain: Secondary | ICD-10-CM

## 2021-02-15 DIAGNOSIS — M6281 Muscle weakness (generalized): Secondary | ICD-10-CM | POA: Diagnosis not present

## 2021-02-15 DIAGNOSIS — R600 Localized edema: Secondary | ICD-10-CM

## 2021-02-15 DIAGNOSIS — M25512 Pain in left shoulder: Secondary | ICD-10-CM | POA: Diagnosis not present

## 2021-02-15 DIAGNOSIS — M25612 Stiffness of left shoulder, not elsewhere classified: Secondary | ICD-10-CM | POA: Diagnosis not present

## 2021-02-15 DIAGNOSIS — M25562 Pain in left knee: Secondary | ICD-10-CM

## 2021-02-15 DIAGNOSIS — R262 Difficulty in walking, not elsewhere classified: Secondary | ICD-10-CM

## 2021-02-15 NOTE — Therapy (Addendum)
[facility] Therapy [address] [place], [place], [address] Phone: [phone]   Fax:  [phone]  Physical Therapy Evaluation  Patient Details  Name: Rachel Crane MRN: [id number] Date of Birth: 73/01/1946 Referring Provider (PT): Dr. Erlinda Hong   Encounter Date: 73/09/2020   PT End of Session - 02/15/21 1426     Visit Number 1    Number of Visits 20    Date for PT Re-Evaluation 04/26/21    Authorization Type Medicare[place]UHC [place]  - has had 9 visits in 2022 prior to current treatment cycle.  KX at visit 6    Progress Note Due on Visit 10    PT Start Time 1430    PT Stop Time 1510    PT Time Calculation (min) 40 min    Activity Tolerance Patient limited by pain    Behavior During Therapy [facility] for tasks assessed/performed             Past Medical History:  Diagnosis Date   Abnormal stress ECG 10/06/2018   Aortic valve sclerosis 10/06/2018   Arthritis    Asthma    Bilateral carotid bruits 10/06/2018   Headache    H/O bad HA, since using CPAP- she has had improvement with that issue    Hypertension    Laboratory examination 10/06/2018   OSA on CPAP    settting - 8, uses CPAP q night    Pre-diabetes    Shingles     Past Surgical History:  Procedure Laterality Date   CHOLECYSTECTOMY  1998   TOTAL KNEE ARTHROPLASTY Right 09/19/2017   Procedure: RIGHT TOTAL KNEE ARTHROPLASTY;  Surgeon: Leandrew Koyanagi, MD;  Location: [facility];  Service: Orthopedics;  Laterality: Right;   TOTAL KNEE ARTHROPLASTY Left 06/06/2020   Procedure: LEFT TOTAL KNEE ARTHROPLASTY;  Surgeon: Leandrew Koyanagi, MD;  Location: [place] [facility];  Service: Orthopedics;  Laterality: Left;   TUBAL LIGATION      There were no vitals filed for this visit.    Subjective Assessment - 02/15/21 1437     Subjective Pt. indicated she was here today for Lt knee complaints.   Had seen MD about shoulder and MRI was performed recently and Pt. indicated she was told that she would have to do surgery.   Pt. seemed to focus on knee complaints only and was confused about whether therapy would help shoulder or not.  Pt. indicated knee stiffness up and swollen at times.  History of Lt TKA c therapy care her at clinic 2021/2022.   Pt. stated Lt shoulder hurts to reach behind back, reaching/lifting is troublesome.  Pt. indicated progressive trouble over time.  Difficulty sleeping on that side.  Arm pain for 2-3 months and getting worse.    Limitations Standing;Lifting;House hold activities;Walking    Patient Stated Goals Reduce pain    Currently in Pain? Yes    Pain Score 6     Pain Location Knee    Pain Orientation Left    Pain Descriptors / Indicators Sore;Tightness;Cramping    Pain Type Chronic pain    Pain Onset More than a month ago    Pain Frequency Intermittent    Aggravating Factors  standing, walking prolonged    Pain Relieving Factors rest, medicine helps some    Multiple Pain Sites Yes    Pain Score 9    Pain Location Shoulder    Pain Orientation Left    Pain Descriptors / Indicators Aching;Sore;Sharp    Pain Type Chronic  pain    Pain Onset More than a month ago    Pain Frequency Intermittent    Aggravating Factors  reaching, lifting, behind bac.    Pain Relieving Factors ice/heat, rubbing ointment                [facility] PT Assessment - 02/15/21 0001       Assessment   Medical Diagnosis Lt shoulder pain/Lt knee pain    Referring Provider (PT) Dr. Erlinda Hong    Onset Date/Surgical Date 11/13/20    Hand Dominance Right    Prior Therapy Lt knee in fall 2021, sping 2022      Precautions   Precautions None      Restrictions   Weight Bearing Restrictions No      Balance Screen   Has the patient fallen in the past 6 months No    Has the patient had a decrease in activity level because of a fear of falling?  No    Is the patient reluctant to leave their home because of a fear of falling?  No      Home Environment   Living Environment Private residence    Additional Comments  one step to enter house, has second floor but doesn't have to use      Prior Function   Level of Independence Independent      Observation/Other Assessments   Observations Localized edema Lt knee    Focus on Therapeutic Outcomes (FOTO)  intake 51%, predicted 55%      Functional Tests   Functional tests Single leg stance;Sit to Stand      Single Leg Stance   Comments Lt 5 seconds, Rt 6 seconds      Sit to Stand   Comments diffculty c movement but able without hands from 18 inch chair      Posture/Postural Control   Posture/Postural Control Postural limitations    Postural Limitations Rounded Shoulders      ROM / Strength   AROM / PROM / Strength AROM;PROM;Strength      AROM   Overall AROM Comments Rt hand behind back to L3, Lt hand behind back L3 (pain with both sides).  Pain noted in Lt shoulder AROM in all directions.  Rt shoulder WFL at this time    AROM Assessment Site Shoulder;Knee    Right/Left Shoulder Right;Left    Left Shoulder Flexion 110 Degrees   in supine, painful arc and at end range   Left Shoulder Internal Rotation 40 Degrees   in supine 45 deg abduction c pain   Left Shoulder External Rotation 70 Degrees   in supine 45 deg abduction   Right/Left Knee Right;Left    Left Knee Extension -3   in LAQ   Left Knee Flexion 120   in supine heel slide c pain noted     PROM   Overall PROM Comments Lt shoulder pain at end range, guarding noted    PROM Assessment Site Shoulder    Right/Left Shoulder Left;Right    Left Shoulder Flexion 145 Degrees   in supine, guarding and end range pain noted   Left Shoulder ABduction 120 Degrees   c pain at end range   Left Shoulder Internal Rotation 50 Degrees   in supine 45 deg abduction c pain   Left Shoulder External Rotation 70 Degrees   in supine 45 deg abduction     Strength   Overall Strength Comments Pain noted throughout Lt and Rt MMT listed below.  Lt pain greater than Rt    Strength Assessment Site Shoulder;Elbow;Knee     Right/Left Shoulder Left;Right    Right Shoulder Flexion 4/5    Right Shoulder ABduction 4/5    Right Shoulder Internal Rotation 5/5    Right Shoulder External Rotation 4/5    Left Shoulder Flexion 2-/5    Left Shoulder ABduction 2-/5    Left Shoulder Internal Rotation 2/5    Left Shoulder External Rotation 2/5    Right/Left Elbow Left;Right    Right Elbow Flexion 5/5    Right Elbow Extension 5/5    Left Elbow Flexion 4/5    Left Elbow Extension 4/5    Right/Left Knee Left;Right    Right Knee Flexion 5/5    Right Knee Extension 5/5    Left Knee Flexion 4/5    Left Knee Extension 4/5   pain     Special Tests   Other special tests (+) shrug on Lt, painful arc      Ambulation/Gait   Gait Comments Reduced stance on Lt leg, reduced weight shift on Lt, knee flexion in stance noted.  Independent ambulation                        Objective measurements completed on examination: See above findings.       [facility] Adult PT Treatment/Exercise - 02/15/21 0001       Exercises   Exercises Other Exercises;Shoulder    Other Exercises  HEP instruction/performance c cues for techniques, handout provided.  Trial set performed of each for comprehension and symptom assessment. See patient instructions.                    PT Education - 02/15/21 1426     Education Details HEP, POC. Education on goals.    Person(s) Educated Patient    Methods Explanation;Demonstration;Verbal cues;Handout    Comprehension Returned demonstration;Verbalized understanding              PT Short Term Goals - 02/15/21 1427       PT SHORT TERM GOAL #1   Title Patient will demonstrate independent use of home exercise program to maintain progress from in clinic treatments.    Time 3    Period Weeks    Status New    Target Date 03/08/21               PT Long Term Goals - 02/15/21 1427       PT LONG TERM GOAL #1   Title Patient will demonstrate/report pain at worst  less than or equal to 2/10 to facilitate minimal limitation in daily activity secondary to pain symptoms.    Time 10    Period Weeks    Status New    Target Date 04/26/21      PT LONG TERM GOAL #2   Title Patient will demonstrate independent use of home exercise program to facilitate ability to maintain/progress functional gains from skilled physical therapy services.    Time 10    Period Weeks    Status New    Target Date 04/26/21      PT LONG TERM GOAL #3   Title Pt. will demonstrate FOTO outcome > or = 55% to indicated reduced disability due to condition.    Time 10    Period Weeks    Status New    Target Date 04/26/21      PT LONG TERM GOAL #4  Title Pt. will demonstrate Lt shoulder AROM within 75% of Rt to facilitate usual daily activity at PLOF.    Time 10    Period Weeks    Status New    Target Date 04/26/21      PT LONG TERM GOAL #5   Title Pt. will demonstrate Lt knee MMT 5/5 throughout to facilitate transfers, ambulation at PLOF s limitation    Time 10    Period Weeks    Status New    Target Date 04/26/21      Additional Long Term Goals   Additional Long Term Goals Yes      PT LONG TERM GOAL #6   Title Pt. will demonstrate SLS bilateral > 15 seconds to facilitate improved stance, ambulation on even and uneven surfaces.    Time 10    Period Weeks    Status New    Target Date 04/26/21                    Plan - 02/15/21 1428     Clinical Impression Statement Patient is a 75 y.o. who comes to clinic with complaints of Lt knee pain c mobility, strength and movement coordination deficits and Lt shoulder pain with mobility, strength and movement coordination deficits that impair their ability to perform usual daily and recreational functional activities without increase difficulty/symptoms at this time.  Patient to benefit from skilled PT services to address impairments and limitations to improve to previous level of function without restriction secondary  to condition.    Examination-Activity Limitations Bathing;Sleep;Carry;Lift;Reach Overhead;Bend;Bed Mobility;Squat;Stairs;Stand;Locomotion Level;Transfers    Examination-Participation Restrictions Community Activity;Shop;Laundry;Cleaning    Stability/Clinical Decision Making Evolving/Moderate complexity    Clinical Decision Making Moderate    Rehab Potential --   fair to good   PT Frequency 2x / week    PT Duration Other (comment)   10 weeks   PT Treatment/Interventions ADLs/Self Care Home Management;Cryotherapy;Electrical Stimulation;Iontophoresis '4mg'$ /ml Dexamethasone;Moist Heat;Traction;Balance training;Manual techniques;Therapeutic activities;Therapeutic exercise;Functional mobility training;Stair training;Gait training;DME Instruction;Ultrasound;Neuromuscular re-education;Patient/family education;Passive range of motion;Spinal Manipulations;Joint Manipulations;Dry needling;Taping;Vasopneumatic Device    PT Next Visit Plan Strengthening and balance for leg, Lt shoulder easy AAROM/isometrics,    PT Home Exercise Plan [email or web address]    Consulted and Agree with Plan of Care Patient             Patient will benefit from skilled therapeutic intervention in order to improve the following deficits and impairments:  Decreased endurance, Hypomobility, Decreased activity tolerance, Decreased strength, Impaired UE functional use, Pain, Increased fascial restricitons, Decreased mobility, Decreased range of motion, Impaired perceived functional ability, Improper body mechanics, Postural dysfunction, Impaired flexibility, Decreased coordination, Abnormal gait, Increased edema, Decreased balance, Difficulty walking  Visit Diagnosis: Chronic left shoulder pain - Plan: CANCELED: PT plan of care cert/re-cert  Chronic pain of left knee  Muscle weakness (generalized) - Plan: CANCELED: PT plan of care cert/re-cert  Difficulty in walking, not elsewhere classified  Localized edema     Problem List Patient  Active Problem List   Diagnosis Date Noted   Status post total left knee replacement 06/06/2020   Positive ANA (antinuclear antibody) 04/25/2020   Primary osteoarthritis of left knee 04/14/2020   Lumbar radiculopathy 06/26/2019   Left-sided low back pain with left-sided sciatica 06/26/2019   Thoracic spinal stenosis 04/18/2019   Seasonal allergies 12/04/2018   Allergic rhinoconjunctivitis 10/10/2018   Moderate persistent asthma with acute exacerbation 10/10/2018   Pruritus 10/10/2018   Aortic valve sclerosis 10/06/2018   Bilateral carotid bruits 10/06/2018  Abnormal stress ECG 10/06/2018   Laboratory examination 10/06/2018   Palpitations 10/06/2018   Total knee replacement status 09/19/2017   Sprain of anterior talofibular ligament of right ankle 12/13/2016   Scot Jun, PT, DPT, OCS, ATC 02/15/21  7:14 PM   [facility] [address] [place], [place], [address] Phone: [phone]   Fax:  [phone]  Name: MIKYLA SALATINO MRN: [id number] Date of Birth: 1946-05-08

## 2021-02-15 NOTE — Addendum Note (Signed)
Addended by: Scot Jun B on: 02/15/2021 07:15 PM   Modules accepted: Orders

## 2021-02-15 NOTE — Patient Instructions (Signed)
Access Code: [email or web address] URL: [email or web address] Date: 02/15/2021 Prepared by: Scot Jun  Exercises Supine Heel Slide (Mirrored) - 2 x daily - 7 x weekly - 3 sets - 10 reps - 2 hold Seated Straight Leg Heel Taps - 2 x daily - 7 x weekly - 3 sets - 10 reps Sit to Stand - 1 x daily - 7 x weekly - 3 sets - 10 reps Seated Long Arc Quad - 2 x daily - 7 x weekly - 3 sets - 10 reps - 2 hold Seated Scapular Retraction - 2 x daily - 7 x weekly - 2 sets - 10 reps - 5 hold

## 2021-02-20 ENCOUNTER — Ambulatory Visit (HOSPITAL_COMMUNITY)
Admission: EM | Admit: 2021-02-20 | Discharge: 2021-02-20 | Disposition: A | Discharge status: Home / Self Care | Payer: Medicare Other

## 2021-02-20 ENCOUNTER — Other Ambulatory Visit: Payer: Self-pay

## 2021-02-20 ENCOUNTER — Encounter (HOSPITAL_COMMUNITY): Payer: Self-pay

## 2021-02-20 DIAGNOSIS — S29019A Strain of muscle and tendon of unspecified wall of thorax, initial encounter: Secondary | ICD-10-CM

## 2021-02-20 MED ORDER — CYCLOBENZAPRINE HCL 5 MG PO TABS
5.0000 mg | ORAL_TABLET | Freq: Three times a day (TID) | ORAL | 0 refills | Status: DC | PRN
Start: 2021-02-20 — End: 2021-03-31

## 2021-02-20 MED ORDER — PREDNISONE 20 MG PO TABS: 40.0000 mg | ORAL_TABLET | Freq: Every day | ORAL | 0 refills | Status: DC

## 2021-02-20 NOTE — ED Triage Notes (Signed)
Pt reports right sided lower back pain x 3 days.

## 2021-02-20 NOTE — ED Provider Notes (Signed)
[facility]    CSN: [id number] Arrival date & time: 02/20/21  1638      History   Chief Complaint Chief Complaint  Patient presents with   Back Pain    HPI Rachel Crane is a 75 y.o. female.   Patient presenting today with 3-day history of right-sided mid to upper back pain that started when she was packing some boxes to travel with.  She states that movement makes it worse and she occasionally has pain, numbness running down her right arm.  She denies swelling, bruising, skin changes, right arm weakness, bowel or bladder incontinence, saddle paresthesias, leg weakness or numbness.  Does have known thoracic spinal stenosis, lumbar radiculopathy with stenosis.  Took 1 dose of Tylenol and 1 dose of ibuprofen so far with minimal relief.  Past Medical History:  Diagnosis Date   Abnormal stress ECG 10/06/2018   Aortic valve sclerosis 10/06/2018   Arthritis    Asthma    Bilateral carotid bruits 10/06/2018   Headache    H/O bad HA, since using CPAP- she has had improvement with that issue    Hypertension    Laboratory examination 10/06/2018   OSA on CPAP    settting - 8, uses CPAP q night    Pre-diabetes    Shingles     Patient Active Problem List   Diagnosis Date Noted   Status post total left knee replacement 06/06/2020   Positive ANA (antinuclear antibody) 04/25/2020   Primary osteoarthritis of left knee 04/14/2020   Lumbar radiculopathy 06/26/2019   Left-sided low back pain with left-sided sciatica 06/26/2019   Thoracic spinal stenosis 04/18/2019   Seasonal allergies 12/04/2018   Allergic rhinoconjunctivitis 10/10/2018   Moderate persistent asthma with acute exacerbation 10/10/2018   Pruritus 10/10/2018   Aortic valve sclerosis 10/06/2018   Bilateral carotid bruits 10/06/2018   Abnormal stress ECG 10/06/2018   Laboratory examination 10/06/2018   Palpitations 10/06/2018   Total knee replacement status 09/19/2017   Sprain of anterior talofibular ligament of  right ankle 12/13/2016    Past Surgical History:  Procedure Laterality Date   CHOLECYSTECTOMY  1998   TOTAL KNEE ARTHROPLASTY Right 09/19/2017   Procedure: RIGHT TOTAL KNEE ARTHROPLASTY;  Surgeon: Leandrew Koyanagi, MD;  Location: [facility];  Service: Orthopedics;  Laterality: Right;   TOTAL KNEE ARTHROPLASTY Left 06/06/2020   Procedure: LEFT TOTAL KNEE ARTHROPLASTY;  Surgeon: Leandrew Koyanagi, MD;  Location: [facility];  Service: Orthopedics;  Laterality: Left;   TUBAL LIGATION      OB History   No obstetric history on file.      Home Medications    Prior to Admission medications   Medication Sig Start Date End Date Taking? Authorizing Provider  cyclobenzaprine (FLEXERIL) 5 MG tablet Take 1 tablet (5 mg total) by mouth 3 (three) times daily as needed for muscle spasms. Do not drink alcohol or drive while taking this medication.  May cause drowsiness 02/20/21  Yes Volney American, PA-C  predniSONE (DELTASONE) 20 MG tablet Take 2 tablets (40 mg total) by mouth daily with breakfast. 02/20/21  Yes Volney American, PA-C  Albuterol Sulfate, sensor, (PROAIR DIGIHALER) 108 (90 Base) MCG/ACT AEPB Inhale 2 puffs into the lungs every 4 (four) hours as needed (shortness of breath, wheezing, cough). 08/02/20   Kennith Gain, MD  aspirin EC 81 MG tablet Take 1 tablet (81 mg total) by mouth 2 (two) times daily. To be taken twice daily x 6 weeks after surgery  to prevent blood clots 06/03/20   Aundra Dubin, PA-C  azelastine (ASTELIN) 0.1 % nasal spray Place 2 sprays into both nostrils 2 (two) times daily. 01/07/20   Kennith Gain, MD  budesonide-formoterol [facility] 80-4.5 MCG/ACT inhaler 2 Puffs 2 times daily 08/02/20   Kennith Gain, MD  chlorthalidone (HYGROTON) 25 MG tablet Take 25 mg by mouth daily.  11/03/19   [provider]  cholecalciferol (VITAMIN D) 25 MCG (1000 UNIT) tablet Take 1,000 Units by mouth daily.    [provider]  diclofenac  (VOLTAREN) 75 MG EC tablet Take 1 tablet (75 mg total) by mouth 2 (two) times daily. 08/12/20   Leandrew Koyanagi, MD  fluticasone (FLONASE) 50 MCG/ACT nasal spray Place 2 sprays into both nostrils daily. Patient taking differently: Place 2 sprays into both nostrils daily as needed for rhinitis. 01/07/20   Kennith Gain, MD  gabapentin (NEURONTIN) 300 MG capsule Take 1 capsule (300 mg total) by mouth 3 (three) times daily. 02/01/20   Magnus Sinning, MD  HYDROmorphone (DILAUDID) 2 MG tablet Take 1 tablet (2 mg total) by mouth 3 (three) times daily as needed for severe pain. 07/11/20   Aundra Dubin, PA-C  ibuprofen (ADVIL) 800 MG tablet Take 1 tablet (800 mg total) by mouth 2 (two) times daily as needed. 11/01/20   Leandrew Koyanagi, MD  latanoprost (XALATAN) 0.005 % ophthalmic solution SMARTSIG:1 Drop(s) In Eye(s) Every Evening 02/05/21   [provider]  loratadine (CLARITIN) 10 MG tablet Take 1 tablet (10 mg total) by mouth daily. 01/07/20   Kennith Gain, MD  losartan (COZAAR) 100 MG tablet Take 1 tablet (100 mg total) by mouth daily. 12/04/18   [name], NP  metFORMIN (GLUCOPHAGE-XR) 500 MG 24 hr tablet SMARTSIG:1 Tablet(s) By Mouth Every Evening 10/15/20   [provider]  Polyethyl Glycol-Propyl Glycol (SYSTANE) 0.4-0.3 % SOLN Place 1 drop into both eyes every morning.    [provider]  Potassium Chloride ER 20 MEQ TBCR Take 1 tablet by mouth daily. 08/09/20   [provider]  pravastatin (PRAVACHOL) 40 MG tablet Take 40 mg by mouth daily.  03/31/19   [provider]  traMADol (ULTRAM) 50 MG tablet Take 1 tablet (50 mg total) by mouth daily as needed. 11/01/20   Leandrew Koyanagi, MD  Zinc 50 MG CAPS Take 50 mg by mouth daily.     [provider]    Family History Family History  Problem Relation Age of Onset   Breast cancer Sister    Hypertension Mother    Arthritis Mother     Social History Social History    Tobacco Use   Smoking status: Never   Smokeless tobacco: Never  Vaping Use   Vaping Use: Never used  Substance Use Topics   Alcohol use: Not Currently   Drug use: No     Allergies   Hydrocodone-acetaminophen   Review of Systems Review of Systems Per HPI  Physical Exam Triage Vital Signs ED Triage Vitals  Enc Vitals Group     BP 02/20/21 1759 139/63     Pulse Rate 02/20/21 1759 (!) 57     Resp 02/20/21 1759 18     Temp 02/20/21 1759 98.6 F (37 C)     Temp Source 02/20/21 1759 Oral     SpO2 02/20/21 1759 99 %     Weight --      Height --  Head Circumference --      Peak Flow --      Pain Score 02/20/21 1755 10     Pain Loc --      Pain Edu? --      Excl. in [place]? --    No data found.  Updated Vital Signs BP 139/63 (BP Location: Right Arm)   Pulse (!) 57   Temp 98.6 F (37 C) (Oral)   Resp 18   SpO2 99%   Visual Acuity Right Eye Distance:   Left Eye Distance:   Bilateral Distance:    Right Eye Near:   Left Eye Near:    Bilateral Near:     Physical Exam Vitals and nursing note reviewed.  Constitutional:      Appearance: Normal appearance. She is not ill-appearing.  HENT:     Head: Atraumatic.     Mouth/Throat:     Mouth: Mucous membranes are moist.  Eyes:     Extraocular Movements: Extraocular movements intact.     Conjunctiva/sclera: Conjunctivae normal.  Cardiovascular:     Rate and Rhythm: Normal rate and regular rhythm.     Pulses: Normal pulses.     Heart sounds: Normal heart sounds.  Pulmonary:     Effort: Pulmonary effort is normal. No respiratory distress.     Breath sounds: Normal breath sounds. No wheezing or rales.  Abdominal:     General: Bowel sounds are normal. There is no distension.     Palpations: Abdomen is soft.     Tenderness: There is no abdominal tenderness. There is no guarding.  Musculoskeletal:        General: Tenderness present. No swelling, deformity or signs of injury. Normal range of motion.     Cervical  back: Normal range of motion and neck supple.     Comments: No midline spinal tenderness palpation diffusely.  Moderate right thoracic lateral musculature tender to palpation and in spasm.  Range of motion intact but obviously painful for patient  Skin:    General: Skin is warm and dry.     Findings: No bruising, erythema, lesion or rash.  Neurological:     Mental Status: She is alert and oriented to person, place, and time.     Sensory: No sensory deficit.     Motor: No weakness.     Gait: Gait normal.  Psychiatric:        Mood and Affect: Mood normal.        Thought Content: Thought content normal.        Judgment: Judgment normal.     UC Treatments / Results  Labs (all labs ordered are listed, but only abnormal results are displayed) Labs Reviewed - No data to display  EKG   Radiology No results found.  Procedures Procedures (including critical care time)  Medications Ordered in UC Medications - No data to display  Initial Impression / Assessment and Plan / UC Course  I have reviewed the triage vital signs and the nursing notes.  Pertinent labs & imaging results that were available during my care of the patient were reviewed by me and considered in my medical decision making (see chart for details).     Consistent with thoracic strain, treat with short burst of prednisone, Flexeril, heat, stretches, rest.  Work note given.  Follow-up if worsening or not resolving.  Final Clinical Impressions(s) / UC Diagnoses   Final diagnoses:  Thoracic myofascial strain, initial encounter   Discharge Instructions   None  ED Prescriptions     Medication Sig Dispense Auth. Provider   predniSONE (DELTASONE) 20 MG tablet Take 2 tablets (40 mg total) by mouth daily with breakfast. 10 tablet Volney American, PA-C   cyclobenzaprine (FLEXERIL) 5 MG tablet Take 1 tablet (5 mg total) by mouth 3 (three) times daily as needed for muscle spasms. Do not drink alcohol or drive  while taking this medication.  May cause drowsiness 15 tablet Volney American, [place]      PDMP not reviewed this encounter.   Volney American, [place] 02/20/21 1843

## 2021-02-23 ENCOUNTER — Ambulatory Visit: Payer: Medicare Other | Admitting: Orthopaedic Surgery

## 2021-03-01 ENCOUNTER — Encounter: Payer: Medicare Other | Admitting: Physical Therapy

## 2021-03-02 ENCOUNTER — Ambulatory Visit (HOSPITAL_COMMUNITY)
Admission: EM | Admit: 2021-03-02 | Discharge: 2021-03-02 | Disposition: A | Discharge status: Home / Self Care | Payer: Medicare Other | Attending: Physician Assistant | Admitting: Physician Assistant

## 2021-03-02 ENCOUNTER — Other Ambulatory Visit: Payer: Self-pay

## 2021-03-02 ENCOUNTER — Encounter (HOSPITAL_COMMUNITY): Payer: Self-pay

## 2021-03-02 ENCOUNTER — Ambulatory Visit (INDEPENDENT_AMBULATORY_CARE_PROVIDER_SITE_OTHER): Payer: Medicare Other

## 2021-03-02 DIAGNOSIS — R059 Cough, unspecified: Secondary | ICD-10-CM

## 2021-03-02 DIAGNOSIS — J40 Bronchitis, not specified as acute or chronic: Secondary | ICD-10-CM | POA: Diagnosis not present

## 2021-03-02 DIAGNOSIS — J329 Chronic sinusitis, unspecified: Secondary | ICD-10-CM | POA: Diagnosis not present

## 2021-03-02 MED ORDER — PROAIR DIGIHALER 108 (90 BASE) MCG/ACT IN AEPB: 2.0000 | INHALATION_SPRAY | RESPIRATORY_TRACT | 0 refills | Status: DC | PRN

## 2021-03-02 MED ORDER — DOXYCYCLINE HYCLATE 100 MG PO CAPS: 100.0000 mg | ORAL_CAPSULE | Freq: Two times a day (BID) | ORAL | 0 refills | Status: DC

## 2021-03-02 MED ORDER — PREDNISONE 20 MG PO TABS: 40.0000 mg | ORAL_TABLET | Freq: Every day | ORAL | 0 refills | Status: DC

## 2021-03-02 NOTE — ED Provider Notes (Signed)
[place]    CSN: [id number] Arrival date & time: 03/02/21  [id number]      History   Chief Complaint Chief Complaint  Patient presents with   Cough   Wheezing    HPI Rachel Crane is a 75 y.o. female.   Patient presents today with a 4 to 5-day history of persistent cough.  Reports cough is productive with thick blood-tinged sputum.  Reports a discomfort of her right ribs with coughing.  She denies any pleuritic pain or ongoing chest pain states symptoms are only present with coughing.  She also reports increased wheezing and increased reliance on her albuterol inhaler.  She reports some nasal congestion and sinus pressure as well as drainage on the back of her throat.  She denies any fever, nausea, vomiting, chest pain.  She denies any recent antibiotic use.  Denies any known sick contacts.  She does have a history of asthma but states current symptoms are more extreme than previous episodes of this condition.  She denies history of smoking or COPD.  She has tried DayQuil and NyQuil over-the-counter without improvement of symptoms.  She has had COVID-19 vaccinations including booster.   Past Medical History:  Diagnosis Date   Abnormal stress ECG 10/06/2018   Aortic valve sclerosis 10/06/2018   Arthritis    Asthma    Bilateral carotid bruits 10/06/2018   Headache    H/O bad HA, since using CPAP- she has had improvement with that issue    Hypertension    Laboratory examination 10/06/2018   OSA on CPAP    settting - 8, uses CPAP q night    Pre-diabetes    Shingles     Patient Active Problem List   Diagnosis Date Noted   Status post total left knee replacement 06/06/2020   Positive ANA (antinuclear antibody) 04/25/2020   Primary osteoarthritis of left knee 04/14/2020   Lumbar radiculopathy 06/26/2019   Left-sided low back pain with left-sided sciatica 06/26/2019   Thoracic spinal stenosis 04/18/2019   Seasonal allergies 12/04/2018   Allergic rhinoconjunctivitis  10/10/2018   Moderate persistent asthma with acute exacerbation 10/10/2018   Pruritus 10/10/2018   Aortic valve sclerosis 10/06/2018   Bilateral carotid bruits 10/06/2018   Abnormal stress ECG 10/06/2018   Laboratory examination 10/06/2018   Palpitations 10/06/2018   Total knee replacement status 09/19/2017   Sprain of anterior talofibular ligament of right ankle 12/13/2016    Past Surgical History:  Procedure Laterality Date   CHOLECYSTECTOMY  1998   TOTAL KNEE ARTHROPLASTY Right 09/19/2017   Procedure: RIGHT TOTAL KNEE ARTHROPLASTY;  Surgeon: Leandrew Koyanagi, MD;  Location: [facility] [place];  Service: Orthopedics;  Laterality: Right;   TOTAL KNEE ARTHROPLASTY Left 06/06/2020   Procedure: LEFT TOTAL KNEE ARTHROPLASTY;  Surgeon: Leandrew Koyanagi, MD;  Location: [facility];  Service: Orthopedics;  Laterality: Left;   TUBAL LIGATION      OB History   No obstetric history on file.      Home Medications    Prior to Admission medications   Medication Sig Start Date End Date Taking? Authorizing Provider  doxycycline (VIBRAMYCIN) 100 MG capsule Take 1 capsule (100 mg total) by mouth 2 (two) times daily. 03/02/21  Yes Shann Merrick K, PA-C  Albuterol Sulfate, sensor, (PROAIR DIGIHALER) 108 (90 Base) MCG/ACT AEPB Inhale 2 puffs into the lungs every 4 (four) hours as needed (shortness of breath, wheezing, cough). 08/02/20   Kennith Gain, MD  aspirin EC 81 MG tablet Take  1 tablet (81 mg total) by mouth 2 (two) times daily. To be taken twice daily x 6 weeks after surgery to prevent blood clots 06/03/20   Aundra Dubin, PA-C  budesonide-formoterol [facility]) 80-4.5 MCG/ACT inhaler 2 Puffs 2 times daily 08/02/20   Kennith Gain, MD  chlorthalidone (HYGROTON) 25 MG tablet Take 25 mg by mouth daily.  11/03/19   [provider]  cholecalciferol (VITAMIN D) 25 MCG (1000 UNIT) tablet Take 1,000 Units by mouth daily.    [provider]  cyclobenzaprine (FLEXERIL) 5 MG tablet Take  1 tablet (5 mg total) by mouth 3 (three) times daily as needed for muscle spasms. Do not drink alcohol or drive while taking this medication.  May cause drowsiness 02/20/21   Volney American, PA-C  gabapentin (NEURONTIN) 300 MG capsule Take 1 capsule (300 mg total) by mouth 3 (three) times daily. 02/01/20   Magnus Sinning, MD  ibuprofen (ADVIL) 800 MG tablet Take 1 tablet (800 mg total) by mouth 2 (two) times daily as needed. 11/01/20   Leandrew Koyanagi, MD  latanoprost (XALATAN) 0.005 % ophthalmic solution SMARTSIG:1 Drop(s) In Eye(s) Every Evening 02/05/21   [provider]  loratadine (CLARITIN) 10 MG tablet Take 1 tablet (10 mg total) by mouth daily. 01/07/20   Kennith Gain, MD  losartan (COZAAR) 100 MG tablet Take 1 tablet (100 mg total) by mouth daily. 12/04/18   [name], NP  metFORMIN (GLUCOPHAGE-XR) 500 MG 24 hr tablet SMARTSIG:1 Tablet(s) By Mouth Every Evening 10/15/20   [provider]  Potassium Chloride ER 20 MEQ TBCR Take 1 tablet by mouth daily. 08/09/20   [provider]  pravastatin (PRAVACHOL) 40 MG tablet Take 40 mg by mouth daily.  03/31/19   [provider]  predniSONE (DELTASONE) 20 MG tablet Take 2 tablets (40 mg total) by mouth daily with breakfast. 03/02/21   Zanayah Shadowens, Derry Skill, PA-C  Zinc 50 MG CAPS Take 50 mg by mouth daily.     [provider]    Family History Family History  Problem Relation Age of Onset   Breast cancer Sister    Hypertension Mother    Arthritis Mother     Social History Social History   Tobacco Use   Smoking status: Never   Smokeless tobacco: Never  Vaping Use   Vaping Use: Never used  Substance Use Topics   Alcohol use: Not Currently   Drug use: No     Allergies   Hydrocodone-acetaminophen   Review of Systems Review of Systems  Constitutional:  Positive for activity change. Negative for appetite change, fatigue and fever.  HENT:  Positive for congestion, postnasal  drip and sinus pressure. Negative for sneezing and sore throat.   Respiratory:  Positive for cough and wheezing. Negative for chest tightness and shortness of breath.   Cardiovascular:  Negative for chest pain.  Gastrointestinal:  Negative for abdominal pain, diarrhea, nausea and vomiting.  Musculoskeletal:  Negative for arthralgias and myalgias.  Neurological:  Negative for dizziness, light-headedness and headaches.    Physical Exam Triage Vital Signs ED Triage Vitals  Enc Vitals Group     BP 03/02/21 1133 (!) 156/69     Pulse Rate 03/02/21 1133 81     Resp 03/02/21 1133 18     Temp 03/02/21 1133 98.3 F (36.8 C)     Temp Source 03/02/21 1133 Oral     SpO2 03/02/21 1133 95 %     Weight --  Height --      Head Circumference --      Peak Flow --      Pain Score 03/02/21 1130 0     Pain Loc --      Pain Edu? --      Excl. in [place]? --    No data found.  Updated Vital Signs BP (!) 156/69 (BP Location: Right Arm)   Pulse 81   Temp 98.3 F (36.8 C) (Oral)   Resp 18   SpO2 95%   Visual Acuity Right Eye Distance:   Left Eye Distance:   Bilateral Distance:    Right Eye Near:   Left Eye Near:    Bilateral Near:     Physical Exam Vitals reviewed.  Constitutional:      General: She is awake. She is not in acute distress.    Appearance: Normal appearance. She is normal weight. She is not ill-appearing.     Comments: Very pleasant female appears stated age no acute distress sitting comfortably in exam room  HENT:     Head: Normocephalic and atraumatic.     Right Ear: Tympanic membrane, ear canal and external ear normal. There is impacted cerumen. Tympanic membrane is not erythematous or bulging.     Left Ear: Tympanic membrane, ear canal and external ear normal. There is impacted cerumen. Tympanic membrane is not erythematous or bulging.     Ears:     Comments: Cerumen noted bilateral ear canal; able to visualize approximately 30% of TM that appears normal bilaterally     Nose:     Right Sinus: No maxillary sinus tenderness or frontal sinus tenderness.     Left Sinus: No maxillary sinus tenderness or frontal sinus tenderness.     Mouth/Throat:     Pharynx: Uvula midline. No oropharyngeal exudate or posterior oropharyngeal erythema.     Comments: Mild drainage present posterior oropharynx Cardiovascular:     Rate and Rhythm: Normal rate and regular rhythm.     Heart sounds: Normal heart sounds, S1 normal and S2 normal. No murmur heard. Pulmonary:     Effort: Pulmonary effort is normal.     Breath sounds: Normal breath sounds. No wheezing, rhonchi or rales.     Comments: Clear to auscultation bilaterally Lymphadenopathy:     Head:     Right side of head: No submental, submandibular or tonsillar adenopathy.     Left side of head: No submental, submandibular or tonsillar adenopathy.     Cervical: No cervical adenopathy.  Psychiatric:        Behavior: Behavior is cooperative.     UC Treatments / Results  Labs (all labs ordered are listed, but only abnormal results are displayed) Labs Reviewed - No data to display  EKG   Radiology DG Chest 2 View  Result Date: 03/02/2021 CLINICAL DATA:  Cough EXAM: CHEST - 2 VIEW COMPARISON:  06/02/2020 FINDINGS: The heart size and mediastinal contours are within normal limits. Both lungs are clear. Curvature of the thoracic spine is convex towards the left. Mild multilevel degenerative disc disease identified. IMPRESSION: No active cardiopulmonary abnormalities. Electronically Signed   By: Kerby Moors M.D.   On: 03/02/2021 12:42    Procedures Procedures (including critical care time)  Medications Ordered in UC Medications - No data to display  Initial Impression / Assessment and Plan / UC Course  I have reviewed the triage vital signs and the nursing notes.  Pertinent labs & imaging results that were available during  my care of the patient were reviewed by me and considered in my medical decision making  (see chart for details).      No indication for viral testing given patient has been symptomatic for 5 days and this would not change management.  X-ray obtained showed no acute findings.  Given prolonged and worsening symptoms we will treat for sinobronchitis with doxycycline 100 mg twice daily for 10 days.  She was instructed to stay out of the sun while on this medication due to photosensitivity stated with this medicine.  Prednisone was prescribed to help manage symptoms with instruction to take NSAIDs due to risk of GI bleeding while taking this medication.  She can use Tylenol, Mucinex, Flonase for symptom relief.  Patient was instructed to drink plenty fluid and rest.  Discussed alarm symptoms that warrant emergent evaluation.  Strict return precautions given to which patient expressed understanding.  Final Clinical Impressions(s) / UC Diagnoses   Final diagnoses:  Sinobronchitis  Cough     Discharge Instructions      Your x-ray was normal.  Please take prednisone 40 mg for 5 days.  Do not take NSAIDs including aspirin, ibuprofen/Advil, naproxen/Aleve while taking this medication as it can cause stomach bleeding.  Take doxycycline to cover for infection.  This will make you sensitive to the sun so stay out of the sun while on this medication.  Use Mucinex and Flonase for additional symptom relief.  Continue your inhalers as prescribed.  If you have any worsening symptoms including shortness of breath, regular use of albuterol, chest discomfort, fever you need to be seen immediately.  Follow-up with either our clinic or your primary care provider within 1 week to ensure symptom improvement.     ED Prescriptions     Medication Sig Dispense Auth. Provider   predniSONE (DELTASONE) 20 MG tablet Take 2 tablets (40 mg total) by mouth daily with breakfast. 10 tablet Khalib Fendley K, PA-C   doxycycline (VIBRAMYCIN) 100 MG capsule Take 1 capsule (100 mg total) by mouth 2 (two) times daily. 20  capsule Brienne Liguori, Derry Skill, PA-C      PDMP not reviewed this encounter.   Terrilee Croak, PA-C 03/02/21 1258

## 2021-03-02 NOTE — ED Triage Notes (Signed)
Pt reports cough and wheezing x 3-4 days. Albuterol inhaler gives relief.   Reports she cough and saw some blood spots and she is concern for pneumonia.

## 2021-03-02 NOTE — Discharge Instructions (Addendum)
Your x-ray was normal.  Please take prednisone 40 mg for 5 days.  Do not take NSAIDs including aspirin, ibuprofen/Advil, naproxen/Aleve while taking this medication as it can cause stomach bleeding.  Take doxycycline to cover for infection.  This will make you sensitive to the sun so stay out of the sun while on this medication.  Use Mucinex and Flonase for additional symptom relief.  Continue your inhalers as prescribed.  If you have any worsening symptoms including shortness of breath, regular use of albuterol, chest discomfort, fever you need to be seen immediately.  Follow-up with either our clinic or your primary care provider within 1 week to ensure symptom improvement.

## 2021-03-03 ENCOUNTER — Telehealth: Payer: Self-pay | Admitting: Rehabilitative and Restorative Service Providers"

## 2021-03-03 ENCOUNTER — Encounter: Payer: Medicare Other | Admitting: Rehabilitative and Restorative Service Providers"

## 2021-03-03 NOTE — Telephone Encounter (Signed)
Pt. Indicated she didn't feel well when called after no show for appointment.  Scot Jun, PT, DPT, OCS, ATC 03/03/21  9:47 AM

## 2021-03-06 ENCOUNTER — Other Ambulatory Visit: Payer: Self-pay

## 2021-03-06 ENCOUNTER — Encounter: Payer: Medicare Other | Admitting: Rehabilitative and Restorative Service Providers"

## 2021-03-06 ENCOUNTER — Ambulatory Visit (HOSPITAL_COMMUNITY)
Admission: EM | Admit: 2021-03-06 | Discharge: 2021-03-06 | Disposition: A | Discharge status: Home / Self Care | Payer: Medicare Other | Attending: Physician Assistant | Admitting: Physician Assistant

## 2021-03-06 ENCOUNTER — Encounter (HOSPITAL_COMMUNITY): Payer: Self-pay

## 2021-03-06 ENCOUNTER — Encounter: Payer: Self-pay | Admitting: Rehabilitative and Restorative Service Providers"

## 2021-03-06 DIAGNOSIS — R61 Generalized hyperhidrosis: Secondary | ICD-10-CM | POA: Diagnosis not present

## 2021-03-06 DIAGNOSIS — J40 Bronchitis, not specified as acute or chronic: Secondary | ICD-10-CM

## 2021-03-06 DIAGNOSIS — J329 Chronic sinusitis, unspecified: Secondary | ICD-10-CM | POA: Diagnosis not present

## 2021-03-06 DIAGNOSIS — R059 Cough, unspecified: Secondary | ICD-10-CM | POA: Insufficient documentation

## 2021-03-06 DIAGNOSIS — U071 COVID-19: Secondary | ICD-10-CM | POA: Diagnosis not present

## 2021-03-06 LAB — CBC WITH DIFFERENTIAL/PLATELET
Abs Immature Granulocytes: 0.12 10*3/uL — ABNORMAL HIGH (ref 0.00–0.07)
Basophils Absolute: 0 10*3/uL (ref 0.0–0.1)
Basophils Relative: 0 %
Eosinophils Absolute: 0 10*3/uL (ref 0.0–0.5)
Eosinophils Relative: 0 %
HCT: 39.2 % (ref 36.0–46.0)
Hemoglobin: 13 g/dL (ref 12.0–15.0)
Immature Granulocytes: 1 %
Lymphocytes Relative: 11 %
Lymphs Abs: 1.8 10*3/uL (ref 0.7–4.0)
MCH: 27.7 pg (ref 26.0–34.0)
MCHC: 33.2 g/dL (ref 30.0–36.0)
MCV: 83.6 fL (ref 80.0–100.0)
Monocytes Absolute: 0.6 10*3/uL (ref 0.1–1.0)
Monocytes Relative: 4 %
Neutro Abs: 13 10*3/uL — ABNORMAL HIGH (ref 1.7–7.7)
Neutrophils Relative %: 84 %
Platelets: 252 10*3/uL (ref 150–400)
RBC: 4.69 MIL/uL (ref 3.87–5.11)
RDW: 14 % (ref 11.5–15.5)
WBC: 15.6 10*3/uL — ABNORMAL HIGH (ref 4.0–10.5)
nRBC: 0 % (ref 0.0–0.2)

## 2021-03-06 LAB — SARS CORONAVIRUS 2 (TAT 6-24 HRS): SARS Coronavirus 2: POSITIVE — AB

## 2021-03-06 LAB — COMPREHENSIVE METABOLIC PANEL
ALT: 26 U/L (ref 0–44)
AST: 20 U/L (ref 15–41)
Albumin: 3.8 g/dL (ref 3.5–5.0)
Alkaline Phosphatase: 75 U/L (ref 38–126)
Anion gap: 11 (ref 5–15)
BUN: 26 mg/dL — ABNORMAL HIGH (ref 8–23)
CO2: 27 mmol/L (ref 22–32)
Calcium: 9.6 mg/dL (ref 8.9–10.3)
Chloride: 97 mmol/L — ABNORMAL LOW (ref 98–111)
Creatinine, Ser: 1.22 mg/dL — ABNORMAL HIGH (ref 0.44–1.00)
GFR, Estimated: 47 mL/min — ABNORMAL LOW (ref >60)
Glucose, Bld: 244 mg/dL — ABNORMAL HIGH (ref 70–99)
Potassium: 3.1 mmol/L — ABNORMAL LOW (ref 3.5–5.1)
Sodium: 135 mmol/L (ref 135–145)
Total Bilirubin: 0.6 mg/dL (ref 0.3–1.2)
Total Protein: 7.4 g/dL (ref 6.5–8.1)

## 2021-03-06 MED ORDER — ALBUTEROL SULFATE HFA 108 (90 BASE) MCG/ACT IN AERS: 1.0000 | INHALATION_SPRAY | RESPIRATORY_TRACT | 0 refills | Status: DC | PRN

## 2021-03-06 MED ORDER — LEVOCETIRIZINE DIHYDROCHLORIDE 5 MG PO TABS: 5.0000 mg | ORAL_TABLET | Freq: Every evening | ORAL | 0 refills | Status: DC

## 2021-03-06 NOTE — ED Provider Notes (Signed)
[place]    CSN: [id number] Arrival date & time: 03/06/21  1207      History   Chief Complaint Chief Complaint  Patient presents with   F/U visit   Cough    HPI Rachel Crane is a 75 y.o. female.   Patient presents today with a persistent history of cough.  She was seen 03/02/2021 at which point she was started on doxycycline and prednisone.  She reports the symptoms have improved she is no longer experiencing pain but continues to have a mild cough prompting reevaluation.  She did contact her primary care provider who recommended that she have COVID-19 test despite symptoms being present for more than 10 days.  X-ray obtained at last visit was normal patient was understanding repeat today.  She denies any shortness of breath or chest pain but does report ongoing cough with thick mucus.  She denies any fever but has had several episodes of diaphoresis that she believes this is related to prednisone.  She is unable to pick up maintenance medication and so is requesting a refill of albuterol to have on hand if appropriate today.  Not currently taking any over-the-counter antihistamines for symptom management.  She is requesting COVID testing as well as basic blood work today since she was unable to be seen by her primary care provider.   Past Medical History:  Diagnosis Date   Abnormal stress ECG 10/06/2018   Aortic valve sclerosis 10/06/2018   Arthritis    Asthma    Bilateral carotid bruits 10/06/2018   Headache    H/O bad HA, since using CPAP- she has had improvement with that issue    Hypertension    Laboratory examination 10/06/2018   OSA on CPAP    settting - 8, uses CPAP q night    Pre-diabetes    Shingles     Patient Active Problem List   Diagnosis Date Noted   Status post total left knee replacement 06/06/2020   Positive ANA (antinuclear antibody) 04/25/2020   Primary osteoarthritis of left knee 04/14/2020   Lumbar radiculopathy 06/26/2019   Left-sided low  back pain with left-sided sciatica 06/26/2019   Thoracic spinal stenosis 04/18/2019   Seasonal allergies 12/04/2018   Allergic rhinoconjunctivitis 10/10/2018   Moderate persistent asthma with acute exacerbation 10/10/2018   Pruritus 10/10/2018   Aortic valve sclerosis 10/06/2018   Bilateral carotid bruits 10/06/2018   Abnormal stress ECG 10/06/2018   Laboratory examination 10/06/2018   Palpitations 10/06/2018   Total knee replacement status 09/19/2017   Sprain of anterior talofibular ligament of right ankle 12/13/2016    Past Surgical History:  Procedure Laterality Date   CHOLECYSTECTOMY  1998   TOTAL KNEE ARTHROPLASTY Right 09/19/2017   Procedure: RIGHT TOTAL KNEE ARTHROPLASTY;  Surgeon: Leandrew Koyanagi, MD;  Location: [place];  Service: Orthopedics;  Laterality: Right;   TOTAL KNEE ARTHROPLASTY Left 06/06/2020   Procedure: LEFT TOTAL KNEE ARTHROPLASTY;  Surgeon: Leandrew Koyanagi, MD;  Location: [place];  Service: Orthopedics;  Laterality: Left;   TUBAL LIGATION      OB History   No obstetric history on file.      Home Medications    Prior to Admission medications   Medication Sig Start Date End Date Taking? Authorizing Provider  albuterol (VENTOLIN HFA) 108 (90 Base) MCG/ACT inhaler Inhale 1-2 puffs into the lungs every 4 (four) hours as needed for wheezing or shortness of breath. 03/06/21  Yes Bertram Haddix K, PA-C  levocetirizine (XYZAL  ALLERGY 24HR) 5 MG tablet Take 1 tablet (5 mg total) by mouth every evening. 03/06/21  Yes Rasa Degrazia K, PA-C  aspirin EC 81 MG tablet Take 1 tablet (81 mg total) by mouth 2 (two) times daily. To be taken twice daily x 6 weeks after surgery to prevent blood clots 06/03/20   Aundra Dubin, PA-C  budesonide-formoterol [facility]) 80-4.5 MCG/ACT inhaler 2 Puffs 2 times daily 08/02/20   Kennith Gain, MD  chlorthalidone (HYGROTON) 25 MG tablet Take 25 mg by mouth daily.  11/03/19   [provider]  cholecalciferol (VITAMIN D) 25 MCG  (1000 UNIT) tablet Take 1,000 Units by mouth daily.    [provider]  cyclobenzaprine (FLEXERIL) 5 MG tablet Take 1 tablet (5 mg total) by mouth 3 (three) times daily as needed for muscle spasms. Do not drink alcohol or drive while taking this medication.  May cause drowsiness 02/20/21   Volney American, PA-C  doxycycline (VIBRAMYCIN) 100 MG capsule Take 1 capsule (100 mg total) by mouth 2 (two) times daily. 03/02/21   Jeromey Kruer, Derry Skill, PA-C  gabapentin (NEURONTIN) 300 MG capsule Take 1 capsule (300 mg total) by mouth 3 (three) times daily. 02/01/20   Magnus Sinning, MD  ibuprofen (ADVIL) 800 MG tablet Take 1 tablet (800 mg total) by mouth 2 (two) times daily as needed. 11/01/20   Leandrew Koyanagi, MD  latanoprost (XALATAN) 0.005 % ophthalmic solution SMARTSIG:1 Drop(s) In Eye(s) Every Evening 02/05/21   [provider]  losartan (COZAAR) 100 MG tablet Take 1 tablet (100 mg total) by mouth daily. 12/04/18   [name], NP  metFORMIN (GLUCOPHAGE-XR) 500 MG 24 hr tablet SMARTSIG:1 Tablet(s) By Mouth Every Evening 10/15/20   [provider]  Potassium Chloride ER 20 MEQ TBCR Take 1 tablet by mouth daily. 08/09/20   [provider]  pravastatin (PRAVACHOL) 40 MG tablet Take 40 mg by mouth daily.  03/31/19   [provider]  predniSONE (DELTASONE) 20 MG tablet Take 2 tablets (40 mg total) by mouth daily with breakfast. 03/02/21   Tomeko Scoville, Derry Skill, PA-C  Zinc 50 MG CAPS Take 50 mg by mouth daily.     [provider]    Family History Family History  Problem Relation Age of Onset   Breast cancer Sister    Hypertension Mother    Arthritis Mother     Social History Social History   Tobacco Use   Smoking status: Never   Smokeless tobacco: Never  Vaping Use   Vaping Use: Never used  Substance Use Topics   Alcohol use: Not Currently   Drug use: No     Allergies   Hydrocodone-acetaminophen   Review of Systems Review of Systems   Constitutional:  Negative for activity change, appetite change, fatigue and fever.  HENT:  Positive for congestion and sinus pressure. Negative for sneezing and sore throat.   Respiratory:  Positive for cough. Negative for shortness of breath.   Cardiovascular:  Negative for chest pain.  Gastrointestinal:  Negative for abdominal pain, diarrhea, nausea and vomiting.  Musculoskeletal:  Negative for arthralgias and myalgias.  Neurological:  Negative for dizziness, light-headedness and headaches.    Physical Exam Triage Vital Signs ED Triage Vitals  Enc Vitals Group     BP 03/06/21 1259 (!) 141/64     Pulse Rate 03/06/21 1259 84     Resp 03/06/21 1259 18     Temp 03/06/21 1259 98.8 F (37.1 C)  Temp Source 03/06/21 1259 Oral     SpO2 03/06/21 1259 98 %     Weight --      Height --      Head Circumference --      Peak Flow --      Pain Score 03/06/21 1257 0     Pain Loc --      Pain Edu? --      Excl. in [place]? --    No data found.  Updated Vital Signs BP (!) 141/64 (BP Location: Right Arm)   Pulse 84   Temp 98.8 F (37.1 C) (Oral)   Resp 18   SpO2 98%   Visual Acuity Right Eye Distance:   Left Eye Distance:   Bilateral Distance:    Right Eye Near:   Left Eye Near:    Bilateral Near:     Physical Exam Vitals reviewed.  Constitutional:      General: She is awake. She is not in acute distress.    Appearance: Normal appearance. She is normal weight. She is not ill-appearing.     Comments: Very pleasant female appears stated age in no acute distress  HENT:     Head: Normocephalic and atraumatic.     Right Ear: Ear canal and external ear normal. There is impacted cerumen.     Left Ear: Tympanic membrane, ear canal and external ear normal. Tympanic membrane is not erythematous or bulging.     Nose:     Right Sinus: No maxillary sinus tenderness or frontal sinus tenderness.     Left Sinus: No maxillary sinus tenderness or frontal sinus tenderness.     Mouth/Throat:      Pharynx: Uvula midline. No oropharyngeal exudate or posterior oropharyngeal erythema.     Comments: Mild drainage posterior pharynx Cardiovascular:     Rate and Rhythm: Normal rate and regular rhythm.     Heart sounds: Normal heart sounds, S1 normal and S2 normal. No murmur heard. Pulmonary:     Effort: Pulmonary effort is normal.     Breath sounds: Normal breath sounds. No wheezing, rhonchi or rales.     Comments: Clear to auscultation bilaterally Lymphadenopathy:     Head:     Right side of head: No submental, submandibular or tonsillar adenopathy.     Left side of head: No submental, submandibular or tonsillar adenopathy.     Cervical: No cervical adenopathy.  Psychiatric:        Behavior: Behavior is cooperative.     UC Treatments / Results  Labs (all labs ordered are listed, but only abnormal results are displayed) Labs Reviewed  SARS CORONAVIRUS 2 (TAT 6-24 HRS)  CBC WITH DIFFERENTIAL/PLATELET  COMPREHENSIVE METABOLIC PANEL    EKG   Radiology No results found.  Procedures Procedures (including critical care time)  Medications Ordered in UC Medications - No data to display  Initial Impression / Assessment and Plan / UC Course  I have reviewed the triage vital signs and the nursing notes.  Pertinent labs & imaging results that were available during my care of the patient were reviewed by me and considered in my medical decision making (see chart for details).     COVID testing was obtained at patient's request despite discussion that this is unlikely to be helpful since she has been symptomatic for several weeks.  We will obtain COVID testing so that she has proof of negative test per her request.  She has completed course of prednisone with improvement of  oxygenation and symptoms so will not extend course of steroids at this time.  No indication for repeat chest x-ray given no adventitious sounds on exam and improvement of oxygenation compared to previous  visit.  Basic labs obtained today given episode of diaphoresis that we discussed this is likely a side effect of prednisone.  Patient requested we go ahead and draw lab so CBC and CMP were obtained today-results pending.  She was given refill of albuterol to be used as needed with shortness of breath and cough.  We will switch from loratadine to Xyzal to help manage congestion symptoms.  Recommended she rest and drink plenty of fluid.  Encouraged her to follow-up with primary care provider if symptoms persist despite treatment measures.  Discussed alarm symptoms that warrant emergent evaluation.  Strict return precautions given to which patient expressed understanding.   Final Clinical Impressions(s) / UC Diagnoses   Final diagnoses:  Sinobronchitis  Cough     Discharge Instructions      We will contact you if your COVID test is positive but I do not expect it will be since you have been symptomatic for over a week.  Please complete course of antibiotics as we discussed.  I have sent in a refill of your albuterol inhaler to have on hand for shortness of breath and cough symptoms.  I would also like you to change your antihistamine and stop taking loratadine (Claritin) and start levocetirizine (Xyzal) to help with congestion and mucus production.  Continue with Mucinex and Flonase as we discussed.  We will contact you if your lab work is abnormal.  Make sure you are drinking plenty of fluid.  If you have any worsening symptoms please return for reevaluation.     ED Prescriptions     Medication Sig Dispense Auth. Provider   albuterol (VENTOLIN HFA) 108 (90 Base) MCG/ACT inhaler Inhale 1-2 puffs into the lungs every 4 (four) hours as needed for wheezing or shortness of breath. 8 g Cristyn Crossno K, PA-C   levocetirizine (XYZAL ALLERGY 24HR) 5 MG tablet Take 1 tablet (5 mg total) by mouth every evening. 30 tablet Kamiah Fite, Derry Skill, PA-C      PDMP not reviewed this encounter.   Terrilee Croak,  PA-C 03/06/21 1349

## 2021-03-06 NOTE — ED Triage Notes (Signed)
Pt in for follow up visit. States she feels better but would like a decongestant to clear up her mucus and an albuterol inhaler

## 2021-03-06 NOTE — Discharge Instructions (Addendum)
We will contact you if your COVID test is positive but I do not expect it will be since you have been symptomatic for over a week.  Please complete course of antibiotics as we discussed.  I have sent in a refill of your albuterol inhaler to have on hand for shortness of breath and cough symptoms.  I would also like you to change your antihistamine and stop taking loratadine (Claritin) and start levocetirizine (Xyzal) to help with congestion and mucus production.  Continue with Mucinex and Flonase as we discussed.  We will contact you if your lab work is abnormal.  Make sure you are drinking plenty of fluid.  If you have any worsening symptoms please return for reevaluation.

## 2021-03-07 DIAGNOSIS — I10 Essential (primary) hypertension: Secondary | ICD-10-CM | POA: Diagnosis not present

## 2021-03-07 DIAGNOSIS — R051 Acute cough: Secondary | ICD-10-CM | POA: Diagnosis not present

## 2021-03-07 DIAGNOSIS — U071 COVID-19: Secondary | ICD-10-CM | POA: Diagnosis not present

## 2021-03-07 DIAGNOSIS — E876 Hypokalemia: Secondary | ICD-10-CM | POA: Diagnosis not present

## 2021-03-07 NOTE — Progress Notes (Signed)
This encounter was created in error - please disregard.

## 2021-03-08 ENCOUNTER — Encounter: Payer: Medicare Other | Admitting: Rehabilitative and Restorative Service Providers"

## 2021-03-13 ENCOUNTER — Encounter: Payer: Medicare Other | Admitting: Physical Therapy

## 2021-03-15 ENCOUNTER — Encounter: Payer: Medicare Other | Admitting: Rehabilitative and Restorative Service Providers"

## 2021-03-16 ENCOUNTER — Ambulatory Visit: Payer: Medicare Other | Admitting: Allergy

## 2021-03-22 ENCOUNTER — Other Ambulatory Visit: Payer: Self-pay

## 2021-03-22 ENCOUNTER — Encounter: Payer: Self-pay | Admitting: Rehabilitative and Restorative Service Providers"

## 2021-03-22 ENCOUNTER — Ambulatory Visit (INDEPENDENT_AMBULATORY_CARE_PROVIDER_SITE_OTHER): Payer: Medicare Other | Admitting: Rehabilitative and Restorative Service Providers"

## 2021-03-22 DIAGNOSIS — M6281 Muscle weakness (generalized): Secondary | ICD-10-CM

## 2021-03-22 DIAGNOSIS — R600 Localized edema: Secondary | ICD-10-CM

## 2021-03-22 DIAGNOSIS — R262 Difficulty in walking, not elsewhere classified: Secondary | ICD-10-CM | POA: Diagnosis not present

## 2021-03-22 DIAGNOSIS — M25512 Pain in left shoulder: Secondary | ICD-10-CM

## 2021-03-22 DIAGNOSIS — G8929 Other chronic pain: Secondary | ICD-10-CM

## 2021-03-22 DIAGNOSIS — M25562 Pain in left knee: Secondary | ICD-10-CM

## 2021-03-22 NOTE — Therapy (Signed)
[facility] Physical Therapy [address] [place], [place], [address] Phone: [phone]   Fax:  [phone]  Physical Therapy Treatment  Patient Details  Name: Rachel Crane MRN: [id number] Date of Birth: November 02, 1945 Referring Provider (PT): Dr. Erlinda Hong   Encounter Date: 03/22/2021   PT End of Session - 03/22/21 1507     Visit Number 2    Number of Visits 20    Date for PT Re-Evaluation 04/26/21    Authorization Type Medicare/[employer]HC [place]  - has had 9 visits in 2022 prior to current treatment cycle.  KX at visit 6    Progress Note Due on Visit 10    PT Start Time 1508    PT Stop Time 1547    PT Time Calculation (min) 39 min    Activity Tolerance Patient limited by pain    Behavior During Therapy [facility] for tasks assessed/performed             Past Medical History:  Diagnosis Date   Abnormal stress ECG 10/06/2018   Aortic valve sclerosis 10/06/2018   Arthritis    Asthma    Bilateral carotid bruits 10/06/2018   Headache    H/O bad HA, since using CPAP- she has had improvement with that issue    Hypertension    Laboratory examination 10/06/2018   OSA on CPAP    settting - 8, uses CPAP q night    Pre-diabetes    Shingles     Past Surgical History:  Procedure Laterality Date   CHOLECYSTECTOMY  1998   TOTAL KNEE ARTHROPLASTY Right 09/19/2017   Procedure: RIGHT TOTAL KNEE ARTHROPLASTY;  Surgeon: Leandrew Koyanagi, MD;  Location: [place];  Service: Orthopedics;  Laterality: Right;   TOTAL KNEE ARTHROPLASTY Left 06/06/2020   Procedure: LEFT TOTAL KNEE ARTHROPLASTY;  Surgeon: Leandrew Koyanagi, MD;  Location: [place];  Service: Orthopedics;  Laterality: Left;   TUBAL LIGATION      There were no vitals filed for this visit.   Subjective Assessment - 03/22/21 1511     Subjective Pt. indicated having 4-5/10 pain in Lt knee today, noted in back of knee specifically.  Indicated swelling continued.  Pt. stated she is feeling recovered from recent illness. Pt. stated  Lt shoulder "not too bad."  Pt. stated trouble sleeping due to symptoms.    Limitations Standing;Lifting;House hold activities;Walking    Patient Stated Goals Reduce pain    Currently in Pain? Yes    Pain Score 5     Pain Location Knee    Pain Orientation Left    Pain Descriptors / Indicators Tightness;Throbbing;Sore;Aching    Pain Type Chronic pain    Pain Onset More than a month ago    Pain Frequency Constant    Aggravating Factors  nighttime pain, swollen pain    Pain Relieving Factors nothing much, medicine at times    Pain Score 0   no pain indicated upon arrival   Pain Location Shoulder    Pain Orientation Left    Pain Onset More than a month ago                [facility] PT Assessment - 03/22/21 0001       Assessment   Medical Diagnosis Lt shoulder pain/Lt knee pain    Referring Provider (PT) Dr. Erlinda Hong    Onset Date/Surgical Date 11/13/20    Hand Dominance Right      Sit to Stand   Comments 18 inch s UE  on 1st try                           [facility] Adult PT Treatment/Exercise - 03/22/21 0001       Exercises   Exercises Knee/Hip    Other Exercises  Additional time required for cues for intervention      Knee/Hip Exercises: Stretches   Gastroc Stretch 30 seconds;5 reps;Both   incline board     Knee/Hip Exercises: Aerobic   Nustep Lvl 6 7 mins, lvl 5 3 mins UE/LE      Knee/Hip Exercises: Machines for Strengthening   Cybex Knee Extension eccentric lowering Lt 10 lbs 3 x 10      Knee/Hip Exercises: Seated   Other Seated Knee/Hip Exercises seated Lt SLR 3 x 10    Sit to Sand without UE support;15 reps   slow eccentric lowering focus 18 inch chair     Shoulder Exercises: Seated   Retraction Both   5 sec hold x 10                 Upper Extremity Functional Index Score :   /80     PT Short Term Goals - 03/22/21 1527       PT SHORT TERM GOAL #1   Title Patient will demonstrate independent use of home exercise program to maintain  progress from in clinic treatments.    Baseline 03/22/2021: cues required    Time 3    Period Weeks    Status Partially Met    Target Date 03/08/21               PT Long Term Goals - 03/22/21 1544       PT LONG TERM GOAL #1   Title Patient will demonstrate/report pain at worst less than or equal to 2/10 to facilitate minimal limitation in daily activity secondary to pain symptoms.    Time 10    Period Weeks    Status On-going    Target Date 04/26/21      PT LONG TERM GOAL #2   Title Patient will demonstrate independent use of home exercise program to facilitate ability to maintain/progress functional gains from skilled physical therapy services.    Time 10    Period Weeks    Status On-going    Target Date 04/26/21      PT LONG TERM GOAL #3   Title Pt. will demonstrate FOTO outcome > or = 55% to indicated reduced disability due to condition.    Time 10    Period Weeks    Status On-going    Target Date 04/26/21      PT LONG TERM GOAL #4   Title Pt. will demonstrate Lt shoulder AROM within 75% of Rt to facilitate usual daily activity at PLOF.    Time 10    Period Weeks    Status On-going    Target Date 04/26/21      PT LONG TERM GOAL #5   Title Pt. will demonstrate Lt knee MMT 5/5 throughout to facilitate transfers, ambulation at PLOF s limitation    Time 10    Period Weeks    Status On-going    Target Date 04/26/21      PT LONG TERM GOAL #6   Title Pt. will demonstrate SLS bilateral > 15 seconds to facilitate improved stance, ambulation on even and uneven surfaces.    Time 10    Period Weeks  Status On-going    Target Date 04/26/21                   Plan - 03/22/21 1527     Clinical Impression Statement Continued persistent cues throughout visit for encouragement of strengthening program participation.  Education continued on importance of LE strength for improving functional mobility and activity tolerance.  Presentation limited in part today due  to time since evaluation due to illness.    Examination-Activity Limitations Bathing;Sleep;Carry;Lift;Reach Overhead;Bend;Bed Mobility;Squat;Stairs;Stand;Locomotion Level;Transfers    Examination-Participation Restrictions Community Activity;Shop;Laundry;Cleaning    Stability/Clinical Decision Making Evolving/Moderate complexity    Rehab Potential --   fair to good   PT Frequency 2x / week    PT Duration Other (comment)   10 weeks   PT Treatment/Interventions ADLs/Self Care Home Management;Cryotherapy;Electrical Stimulation;Iontophoresis 40m/ml Dexamethasone;Moist Heat;Traction;Balance training;Manual techniques;Therapeutic activities;Therapeutic exercise;Functional mobility training;Stair training;Gait training;DME Instruction;Ultrasound;Neuromuscular re-education;Patient/family education;Passive range of motion;Spinal Manipulations;Joint Manipulations;Dry needling;Taping;Vasopneumatic Device    PT Next Visit Plan Continue to progress strengthening and balance for leg, Lt shoulder easy AAROM/isometrics as needed    PT Home Exercise Plan [email or web address]    Consulted and Agree with Plan of Care Patient             Patient will benefit from skilled therapeutic intervention in order to improve the following deficits and impairments:  Decreased endurance, Hypomobility, Decreased activity tolerance, Decreased strength, Impaired UE functional use, Pain, Increased fascial restricitons, Decreased mobility, Decreased range of motion, Impaired perceived functional ability, Improper body mechanics, Postural dysfunction, Impaired flexibility, Decreased coordination, Abnormal gait, Increased edema, Decreased balance, Difficulty walking  Visit Diagnosis: Chronic left shoulder pain  Chronic pain of left knee  Muscle weakness (generalized)  Difficulty in walking, not elsewhere classified  Localized edema     Problem List Patient Active Problem List   Diagnosis Date Noted   Status post total left knee  replacement 06/06/2020   Positive ANA (antinuclear antibody) 04/25/2020   Primary osteoarthritis of left knee 04/14/2020   Lumbar radiculopathy 06/26/2019   Left-sided low back pain with left-sided sciatica 06/26/2019   Thoracic spinal stenosis 04/18/2019   Seasonal allergies 12/04/2018   Allergic rhinoconjunctivitis 10/10/2018   Moderate persistent asthma with acute exacerbation 10/10/2018   Pruritus 10/10/2018   Aortic valve sclerosis 10/06/2018   Bilateral carotid bruits 10/06/2018   Abnormal stress ECG 10/06/2018   Laboratory examination 10/06/2018   Palpitations 10/06/2018   Total knee replacement status 09/19/2017   Sprain of anterior talofibular ligament of right ankle 12/13/2016   MScot Jun PT, DPT, OCS, ATC 03/22/21  3:46 PM    [facility] [address] [address]Phone: [phone]  Fax:  [phone] Name: Rachel ESTYMRN: [id number] of Birth: 302/04/1946

## 2021-03-27 ENCOUNTER — Encounter: Payer: Medicare Other | Admitting: Rehabilitative and Restorative Service Providers"

## 2021-03-28 DIAGNOSIS — H401131 Primary open-angle glaucoma, bilateral, mild stage: Secondary | ICD-10-CM | POA: Diagnosis not present

## 2021-03-28 DIAGNOSIS — H25813 Combined forms of age-related cataract, bilateral: Secondary | ICD-10-CM | POA: Diagnosis not present

## 2021-03-29 ENCOUNTER — Other Ambulatory Visit: Payer: Self-pay

## 2021-03-29 ENCOUNTER — Encounter: Payer: Self-pay | Admitting: Rehabilitative and Restorative Service Providers"

## 2021-03-29 ENCOUNTER — Ambulatory Visit (INDEPENDENT_AMBULATORY_CARE_PROVIDER_SITE_OTHER): Payer: Medicare Other | Admitting: Rehabilitative and Restorative Service Providers"

## 2021-03-29 DIAGNOSIS — M25562 Pain in left knee: Secondary | ICD-10-CM | POA: Diagnosis not present

## 2021-03-29 DIAGNOSIS — M25512 Pain in left shoulder: Secondary | ICD-10-CM

## 2021-03-29 DIAGNOSIS — M6281 Muscle weakness (generalized): Secondary | ICD-10-CM

## 2021-03-29 DIAGNOSIS — R600 Localized edema: Secondary | ICD-10-CM | POA: Diagnosis not present

## 2021-03-29 DIAGNOSIS — R262 Difficulty in walking, not elsewhere classified: Secondary | ICD-10-CM

## 2021-03-29 DIAGNOSIS — G8929 Other chronic pain: Secondary | ICD-10-CM

## 2021-03-29 NOTE — Therapy (Addendum)
[facility] [address] [place], [place], [address] Phone: [phone]   Fax:  [phone]  Physical Therapy Treatment  Patient Details  Name: Rachel Crane MRN: [id number] Date of Birth: 1945-12-13 Referring Provider (PT): Dr. Erlinda Hong   Encounter Date: 03/29/2021   PT End of Session - 03/29/21 1516     Visit Number 3    Number of Visits 20    Date for PT Re-Evaluation 04/26/21    Authorization Type Medicare[place]HC [place]  - has had 9 visits in 2022 prior to current treatment cycle.  KX at visit 6    Progress Note Due on Visit 10    PT Start Time 1514    PT Stop Time 1553    PT Time Calculation (min) 39 min    Activity Tolerance Patient tolerated treatment well    Behavior During Therapy [facility] for tasks assessed/performed             Past Medical History:  Diagnosis Date   Abnormal stress ECG 10/06/2018   Aortic valve sclerosis 10/06/2018   Arthritis    Asthma    Bilateral carotid bruits 10/06/2018   Headache    H/O bad HA, since using CPAP- she has had improvement with that issue    Hypertension    Laboratory examination 10/06/2018   OSA on CPAP    settting - 8, uses CPAP q night    Pre-diabetes    Shingles     Past Surgical History:  Procedure Laterality Date   CHOLECYSTECTOMY  1998   TOTAL KNEE ARTHROPLASTY Right 09/19/2017   Procedure: RIGHT TOTAL KNEE ARTHROPLASTY;  Surgeon: Leandrew Koyanagi, MD;  Location: [place];  Service: Orthopedics;  Laterality: Right;   TOTAL KNEE ARTHROPLASTY Left 06/06/2020   Procedure: LEFT TOTAL KNEE ARTHROPLASTY;  Surgeon: Leandrew Koyanagi, MD;  Location: [place];  Service: Orthopedics;  Laterality: Left;   TUBAL LIGATION      There were no vitals filed for this visit.   Subjective Assessment - 03/29/21 1519     Subjective Pt. indicated no pain in knee upon arrival.  Reported 5/10 at worst in time since last visit.  Reported "the same."  Pt. stated Lt shoulder still bothers her at night with  sleeping on side.    Limitations Standing;Lifting;House hold activities;Walking    Patient Stated Goals Reduce pain    Currently in Pain? No/denies    Pain Score 0-No pain    Pain Location Knee    Pain Orientation Left    Pain Onset More than a month ago    Pain Onset More than a month ago                [facility] PT Assessment - 03/29/21 0001       Assessment   Medical Diagnosis Lt shoulder pain/Lt knee pain    Referring Provider (PT) Dr. Erlinda Hong    Onset Date/Surgical Date 11/13/20    Hand Dominance Right      Strength   Left Knee Flexion 4+/5    Left Knee Extension 4/5                           [facility] Adult PT Treatment/Exercise - 03/29/21 0001       Knee/Hip Exercises: Aerobic   Nustep Lvl 6 10 mins UE/LE      Knee/Hip Exercises: Machines for Strengthening   Cybex Knee Extension eccentric lowering Lt 10 lbs  3 x 10    Cybex Knee Flexion Lt only 20 lbs 2 x 10      Knee/Hip Exercises: Standing   Lateral Step Up 2 sets;10 reps;Step Height: 4";Left   eccentric lowering     Knee/Hip Exercises: Seated   Other Seated Knee/Hip Exercises seated slr Lt 2 x 10    Sit to Sand without UE support;20 reps   18 inch chair                      PT Short Term Goals - 03/22/21 1527       PT SHORT TERM GOAL #1   Title Patient will demonstrate independent use of home exercise program to maintain progress from in clinic treatments.    Baseline 03/22/2021: cues required    Time 3    Period Weeks    Status Partially Met    Target Date 03/08/21               PT Long Term Goals - 03/22/21 1544       PT LONG TERM GOAL #1   Title Patient will demonstrate/report pain at worst less than or equal to 2/10 to facilitate minimal limitation in daily activity secondary to pain symptoms.    Time 10    Period Weeks    Status On-going    Target Date 04/26/21      PT LONG TERM GOAL #2   Title Patient will demonstrate independent use of home exercise program  to facilitate ability to maintain/progress functional gains from skilled physical therapy services.    Time 10    Period Weeks    Status On-going    Target Date 04/26/21      PT LONG TERM GOAL #3   Title Pt. will demonstrate FOTO outcome > or = 55% to indicated reduced disability due to condition.    Time 10    Period Weeks    Status On-going    Target Date 04/26/21      PT LONG TERM GOAL #4   Title Pt. will demonstrate Lt shoulder AROM within 75% of Rt to facilitate usual daily activity at PLOF.    Time 10    Period Weeks    Status On-going    Target Date 04/26/21      PT LONG TERM GOAL #5   Title Pt. will demonstrate Lt knee MMT 5/5 throughout to facilitate transfers, ambulation at PLOF s limitation    Time 10    Period Weeks    Status On-going    Target Date 04/26/21      PT LONG TERM GOAL #6   Title Pt. will demonstrate SLS bilateral > 15 seconds to facilitate improved stance, ambulation on even and uneven surfaces.    Time 10    Period Weeks    Status On-going    Target Date 04/26/21                   Plan - 03/29/21 1534     Clinical Impression Statement Quality of ambulation improved today compared to previous (improved stance phase noted c WB acceptance).  Reported muscle fatigue/soreness after intervention last time noted, kept similar resistance today to conitnue to improve strength and monitor DOMS.    Examination-Activity Limitations Bathing;Sleep;Carry;Lift;Reach Overhead;Bend;Bed Mobility;Squat;Stairs;Stand;Locomotion Level;Transfers    Examination-Participation Restrictions Community Activity;Shop;Laundry;Cleaning    Stability/Clinical Decision Making Evolving/Moderate complexity    Rehab Potential --   fair to good   PT  Frequency 2x / week    PT Duration Other (comment)   10 weeks   PT Treatment/Interventions ADLs/Self Care Home Management;Cryotherapy;Electrical Stimulation;Iontophoresis 41m/ml Dexamethasone;Moist Heat;Traction;Balance  training;Manual techniques;Therapeutic activities;Therapeutic exercise;Functional mobility training;Stair training;Gait training;DME Instruction;Ultrasound;Neuromuscular re-education;Patient/family education;Passive range of motion;Spinal Manipulations;Joint Manipulations;Dry needling;Taping;Vasopneumatic Device    PT Next Visit Plan Continue to progress strengthening and balance for leg, Lt shoulder strengthening as indicated (Pt. has indicated reduced complaints from shoulder overall)    PT Home Exercise Plan [id number]    Consulted and Agree with Plan of Care Patient             Patient will benefit from skilled therapeutic intervention in order to improve the following deficits and impairments:  Decreased endurance, Hypomobility, Decreased activity tolerance, Decreased strength, Impaired UE functional use, Pain, Increased fascial restricitons, Decreased mobility, Decreased range of motion, Impaired perceived functional ability, Improper body mechanics, Postural dysfunction, Impaired flexibility, Decreased coordination, Abnormal gait, Increased edema, Decreased balance, Difficulty walking  Visit Diagnosis: Chronic left shoulder pain  Chronic pain of left knee  Muscle weakness (generalized)  Difficulty in walking, not elsewhere classified  Localized edema     Problem List Patient Active Problem List   Diagnosis Date Noted   Status post total left knee replacement 06/06/2020   Positive ANA (antinuclear antibody) 04/25/2020   Primary osteoarthritis of left knee 04/14/2020   Lumbar radiculopathy 06/26/2019   Left-sided low back pain with left-sided sciatica 06/26/2019   Thoracic spinal stenosis 04/18/2019   Seasonal allergies 12/04/2018   Allergic rhinoconjunctivitis 10/10/2018   Moderate persistent asthma with acute exacerbation 10/10/2018   Pruritus 10/10/2018   Aortic valve sclerosis 10/06/2018   Bilateral carotid bruits 10/06/2018   Abnormal stress ECG 10/06/2018    Laboratory examination 10/06/2018   Palpitations 10/06/2018   Total knee replacement status 09/19/2017   Sprain of anterior talofibular ligament of right ankle 12/13/2016    MGirtha Rm PT 03/29/2021, 3:51 PM  [facility] [address] [place] [address]Phone: [phone]  Fax:  [phone] Name: Rachel STICKLEMRN: [id number] of Birth: 313-Mar-1947

## 2021-03-30 ENCOUNTER — Other Ambulatory Visit: Payer: Self-pay

## 2021-03-30 ENCOUNTER — Ambulatory Visit (HOSPITAL_COMMUNITY)
Admission: EM | Admit: 2021-03-30 | Discharge: 2021-03-30 | Disposition: A | Discharge status: Home / Self Care | Payer: Medicare Other | Attending: Family Medicine | Admitting: Family Medicine

## 2021-03-30 ENCOUNTER — Encounter (HOSPITAL_COMMUNITY): Payer: Self-pay

## 2021-03-30 DIAGNOSIS — R739 Hyperglycemia, unspecified: Secondary | ICD-10-CM | POA: Diagnosis not present

## 2021-03-30 DIAGNOSIS — R5383 Other fatigue: Secondary | ICD-10-CM | POA: Diagnosis not present

## 2021-03-30 LAB — BASIC METABOLIC PANEL
Anion gap: 10 (ref 5–15)
BUN: 15 mg/dL (ref 8–23)
CO2: 27 mmol/L (ref 22–32)
Calcium: 9.7 mg/dL (ref 8.9–10.3)
Chloride: 101 mmol/L (ref 98–111)
Creatinine, Ser: 0.99 mg/dL (ref 0.44–1.00)
GFR, Estimated: >60 mL/min (ref >60)
Glucose, Bld: 121 mg/dL — ABNORMAL HIGH (ref 70–99)
Potassium: 3.7 mmol/L (ref 3.5–5.1)
Sodium: 138 mmol/L (ref 135–145)

## 2021-03-30 LAB — CBC
HCT: 35.4 % — ABNORMAL LOW (ref 36.0–46.0)
Hemoglobin: 11.6 g/dL — ABNORMAL LOW (ref 12.0–15.0)
MCH: 28.4 pg (ref 26.0–34.0)
MCHC: 32.8 g/dL (ref 30.0–36.0)
MCV: 86.8 fL (ref 80.0–100.0)
Platelets: 260 10*3/uL (ref 150–400)
RBC: 4.08 MIL/uL (ref 3.87–5.11)
RDW: 14.8 % (ref 11.5–15.5)
WBC: 6.5 10*3/uL (ref 4.0–10.5)
nRBC: 0 % (ref 0.0–0.2)

## 2021-03-30 LAB — HEMOGLOBIN A1C
Hgb A1c MFr Bld: 7.4 % — ABNORMAL HIGH (ref 4.8–5.6)
Mean Plasma Glucose: 165.68 mg/dL

## 2021-03-30 LAB — TSH: TSH: 1.325 u[IU]/mL (ref 0.350–4.500)

## 2021-03-30 LAB — CBG MONITORING, ED: Glucose-Capillary: 121 mg/dL — ABNORMAL HIGH (ref 70–99)

## 2021-03-30 NOTE — ED Triage Notes (Signed)
Pt presents with c/o low energy. States she is concerned of low potassium and iron. Pt states it has been going on for several days and is requesting blood work.

## 2021-03-30 NOTE — Discharge Instructions (Addendum)
Keep your appointment with your primary provider to discuss your elevated blood sugar.  You have had labs (blood work) drawn today. We will call you with any significant abnormalities or if there is need to begin or change treatment or pursue further follow up.  You may also review your test results online through [facility]. If you do not have a MyChart account, instructions to sign up should be on your discharge paperwork.

## 2021-03-31 ENCOUNTER — Encounter: Payer: Self-pay | Admitting: Allergy

## 2021-03-31 ENCOUNTER — Ambulatory Visit (INDEPENDENT_AMBULATORY_CARE_PROVIDER_SITE_OTHER): Payer: Medicare Other | Admitting: Allergy

## 2021-03-31 VITALS — BP 128/68 | HR 73 | Temp 97.7°F | Resp 18 | Ht 66.0 in | Wt 208.6 lb

## 2021-03-31 DIAGNOSIS — J3089 Other allergic rhinitis: Secondary | ICD-10-CM

## 2021-03-31 DIAGNOSIS — J4540 Moderate persistent asthma, uncomplicated: Secondary | ICD-10-CM

## 2021-03-31 DIAGNOSIS — K2100 Gastro-esophageal reflux disease with esophagitis, without bleeding: Secondary | ICD-10-CM

## 2021-03-31 DIAGNOSIS — L299 Pruritus, unspecified: Secondary | ICD-10-CM

## 2021-03-31 MED ORDER — AZELASTINE HCL 0.1 % NA SOLN: 2.0000 | Freq: Two times a day (BID) | NASAL | 5 refills | Status: DC | PRN

## 2021-03-31 MED ORDER — BUDESONIDE-FORMOTEROL FUMARATE 80-4.5 MCG/ACT IN AERO: 2.0000 | INHALATION_SPRAY | Freq: Two times a day (BID) | RESPIRATORY_TRACT | 5 refills | Status: DC

## 2021-03-31 NOTE — Patient Instructions (Addendum)
Allergic Rhinitis: - Continue Flonase 2 sprays each nostril once daily for sinus congestion or stuffy nose - Start Azelastine 0.1% 2 sprays each nostril twice a day for runny nose/post-nasal drip/nasal itch. - Can use both Flonase and Azelastine together if needed - Continue Claritin '10mg'$  daily.   Asthma - ProAir 2 puffs every 4 hours as needed for cough or wheeze - Use Alvesco inhaler 2 puffs twice a day for now (sample provided) while we look to see what is covered by your insurance plan  Control goals:  Full participation in all desired activities (may need albuterol before activity) Albuterol use two time or less a week on average (not counting use with activity) Cough interfering with sleep two time or less a month Oral steroids no more than once a year No hospitalizations  Pruritis - Continue daily moisturizer - Continue Loratidine daily - Continue Triamcinolone 0.1% cream on red itchy areas below your face twice a day as needed.  Reflux  -Continue Prilosec daily  Continue the other medications as listed in the chart.  Follow up in early December 2022 or sooner if needed

## 2021-03-31 NOTE — Progress Notes (Signed)
Follow-up Note  RE: Rachel Crane MRN: [id number] DOB: 03/17/1946 Date of Office Visit: 03/31/2021   History of present illness: Rachel Crane is a 75 y.o. female presenting today for follow-up of allergic rhinitis, asthma, reports some pruritus.  She was last seen in the office on 01/07/2020 by myself.  She had knee surgery in 05/2020.   She reports having a lot of nasal drainage and is leading to a cough.  She states her throat tickles.  She feels like it can choke her but not.   She states she is using an OTC nasal spray at this time.  She does not think she has the astelin as recommended at least visit.  She does take a Claritin daily and states it does help. She uses albuterol almost everyday at this time.  She states one of her inhalers was going to cost about $160 which was not doable for her.  At last visit it was advised for her to take Symbicort.  Am not sure if the Symbicort is the inhaler that was going to be too expensive or not.  However upon review of her formulary it is on her insurance's formulary plan alongside other comparable inhalers. At this visit she does not report continued itch. She does states she has been feeling very fatigued and she did go to the ED recently and had lab work done.  She does have an elevated hemoglobin A1c.  She states she does have metformin that her PCP prescribed to her.  She had not started taking it until yesterday.  With the lab work.  She also reports taking a potassium supplement and potassium levels were normal.  Review of systems: Review of Systems  Constitutional:  Positive for malaise/fatigue.  HENT:         See HPI  Eyes: Negative.   Respiratory:  Positive for cough.   Cardiovascular: Negative.   Gastrointestinal: Negative.   Musculoskeletal: Negative.   Skin: Negative.   Neurological: Negative.    All other systems negative unless noted above in HPI  Past medical/social/surgical/family history have been reviewed and are  unchanged unless specifically indicated below.  No changes  Medication List: Current Outpatient Medications  Medication Sig Dispense Refill   albuterol (VENTOLIN HFA) 108 (90 Base) MCG/ACT inhaler Inhale 1-2 puffs into the lungs every 4 (four) hours as needed for wheezing or shortness of breath. 8 g 0   aspirin EC 81 MG tablet Take 1 tablet (81 mg total) by mouth 2 (two) times daily. To be taken twice daily x 6 weeks after surgery to prevent blood clots 84 tablet 0   azelastine (ASTELIN) 0.1 % nasal spray Place 2 sprays into both nostrils 2 (two) times daily as needed (Nasal drainage). Use in each nostril as directed 30 mL 5   budesonide-formoterol (SYMBICORT) 80-4.5 MCG/ACT inhaler 2 Puffs 2 times daily 1 each 0   budesonide-formoterol (SYMBICORT) 80-4.5 MCG/ACT inhaler Inhale 2 puffs into the lungs in the morning and at bedtime. 1 each 5   chlorthalidone (HYGROTON) 25 MG tablet Take 25 mg by mouth daily.      cholecalciferol (VITAMIN D) 25 MCG (1000 UNIT) tablet Take 1,000 Units by mouth daily.     gabapentin (NEURONTIN) 300 MG capsule Take 1 capsule (300 mg total) by mouth 3 (three) times daily. 270 capsule 3   ibuprofen (ADVIL) 800 MG tablet Take 1 tablet (800 mg total) by mouth 2 (two) times daily as needed. 60 tablet 6  losartan (COZAAR) 100 MG tablet Take 1 tablet (100 mg total) by mouth daily. 30 tablet 3   metFORMIN (GLUCOPHAGE-XR) 500 MG 24 hr tablet SMARTSIG:1 Tablet(s) By Mouth Every Evening     Potassium Chloride ER 20 MEQ TBCR Take 1 tablet by mouth daily.     pravastatin (PRAVACHOL) 40 MG tablet Take 40 mg by mouth daily.      Zinc 50 MG CAPS Take 50 mg by mouth daily.      latanoprost (XALATAN) 0.005 % ophthalmic solution SMARTSIG:1 Drop(s) In Eye(s) Every Evening (Patient not taking: Reported on 03/31/2021)     No current facility-administered medications for this visit.     Known medication allergies: Allergies  Allergen Reactions   Hydrocodone-Acetaminophen     Hives  and nausea     Physical examination: Blood pressure 128/68, pulse 73, temperature 97.7 F (36.5 C), temperature source Temporal, resp. rate 18, height '5\' 6"'$  (1.676 m), weight 208 lb 9.6 oz (94.6 kg), SpO2 96 %.  General: Alert, interactive, in no acute distress. HEENT: PERRLA, TMs pearly gray, turbinates minimally edematous with clear discharge, post-pharynx non erythematous. Neck: Supple without lymphadenopathy. Lungs: Clear to auscultation without wheezing, rhonchi or rales. {no increased work of breathing. CV: Normal S1, S2 without murmurs. Abdomen: Nondistended, nontender. Skin: Warm and dry, without lesions or rashes. Extremities:  No clubbing, cyanosis or edema. Neuro:   Grossly intact.  Diagnositics/Labs: None today  Assessment and plan:   Allergic Rhinitis: - Continue Flonase 2 sprays each nostril once daily for sinus congestion or stuffy nose - Start Azelastine 0.1% 2 sprays each nostril twice a day for runny nose/post-nasal drip/nasal itch. - Can use both Flonase and Azelastine together if needed - Continue Claritin '10mg'$  daily.   Asthma - ProAir 2 puffs every 4 hours as needed for cough or wheeze - Use Alvesco inhaler 2 puffs twice a day for now (sample provided) while we look to see what is covered by your insurance plan.  I have submitted Symbicort as a prescription as it does appear to be on her formulary for the insurance she has provided  Control goals:  Full participation in all desired activities (may need albuterol before activity) Albuterol use two time or less a week on average (not counting use with activity) Cough interfering with sleep two time or less a month Oral steroids no more than once a year No hospitalizations  Pruritis - Continue daily moisturizer - Continue Loratidine daily - Continue Triamcinolone 0.1% cream on red itchy areas below your face twice a day as needed.  Reflux  -Continue Prilosec daily  Continue the other medications as  listed in the chart.  Follow up in early December 2022 or sooner if needed  I appreciate the opportunity to take part in Rachel Crane's care. Please do not hesitate to contact me with questions.  Sincerely,   Prudy Feeler, MD Allergy/Immunology [facility]

## 2021-04-01 ENCOUNTER — Other Ambulatory Visit: Payer: Self-pay | Admitting: Allergy

## 2021-04-03 ENCOUNTER — Ambulatory Visit (INDEPENDENT_AMBULATORY_CARE_PROVIDER_SITE_OTHER): Payer: Medicare Other | Admitting: Rehabilitative and Restorative Service Providers"

## 2021-04-03 ENCOUNTER — Other Ambulatory Visit: Payer: Self-pay

## 2021-04-03 ENCOUNTER — Encounter: Payer: Self-pay | Admitting: Rehabilitative and Restorative Service Providers"

## 2021-04-03 DIAGNOSIS — G8929 Other chronic pain: Secondary | ICD-10-CM | POA: Diagnosis not present

## 2021-04-03 DIAGNOSIS — M6281 Muscle weakness (generalized): Secondary | ICD-10-CM | POA: Diagnosis not present

## 2021-04-03 DIAGNOSIS — M25512 Pain in left shoulder: Secondary | ICD-10-CM | POA: Diagnosis not present

## 2021-04-03 DIAGNOSIS — R262 Difficulty in walking, not elsewhere classified: Secondary | ICD-10-CM

## 2021-04-03 DIAGNOSIS — R5383 Other fatigue: Secondary | ICD-10-CM | POA: Diagnosis not present

## 2021-04-03 DIAGNOSIS — E1169 Type 2 diabetes mellitus with other specified complication: Secondary | ICD-10-CM | POA: Diagnosis not present

## 2021-04-03 DIAGNOSIS — M25562 Pain in left knee: Secondary | ICD-10-CM

## 2021-04-03 DIAGNOSIS — Z7984 Long term (current) use of oral hypoglycemic drugs: Secondary | ICD-10-CM | POA: Diagnosis not present

## 2021-04-03 DIAGNOSIS — I10 Essential (primary) hypertension: Secondary | ICD-10-CM | POA: Diagnosis not present

## 2021-04-03 DIAGNOSIS — D638 Anemia in other chronic diseases classified elsewhere: Secondary | ICD-10-CM | POA: Diagnosis not present

## 2021-04-03 DIAGNOSIS — R600 Localized edema: Secondary | ICD-10-CM

## 2021-04-03 NOTE — Therapy (Addendum)
[facility] [address] [place], [place], [address] Phone: [phone]   Fax:  [phone]  Physical Therapy Treatment/Discharge   Patient Details  Name: Rachel Crane MRN: [id number] Date of Birth: 10/08/1945 Referring Provider (PT): Dr. Erlinda Hong   Encounter Date: 04/03/2021   PT End of Session - 04/03/21 1515     Visit Number 4    Number of Visits 20    Date for PT Re-Evaluation 04/26/21    Authorization Type Medicare[place] [place]  - has had 9 visits in 2022 prior to current treatment cycle.  KX at visit 6    Progress Note Due on Visit 10    PT Start Time 1508    PT Stop Time 1548    PT Time Calculation (min) 40 min    Activity Tolerance Patient tolerated treatment well    Behavior During Therapy [facility] for tasks assessed/performed             Past Medical History:  Diagnosis Date   Abnormal stress ECG 10/06/2018   Aortic valve sclerosis 10/06/2018   Arthritis    Asthma    Bilateral carotid bruits 10/06/2018   Headache    H/O bad HA, since using CPAP- she has had improvement with that issue    Hypertension    Laboratory examination 10/06/2018   OSA on CPAP    settting - 8, uses CPAP q night    Pre-diabetes    Shingles     Past Surgical History:  Procedure Laterality Date   CHOLECYSTECTOMY  1998   TOTAL KNEE ARTHROPLASTY Right 09/19/2017   Procedure: RIGHT TOTAL KNEE ARTHROPLASTY;  Surgeon: Leandrew Koyanagi, MD;  Location: [place];  Service: Orthopedics;  Laterality: Right;   TOTAL KNEE ARTHROPLASTY Left 06/06/2020   Procedure: LEFT TOTAL KNEE ARTHROPLASTY;  Surgeon: Leandrew Koyanagi, MD;  Location: [place];  Service: Orthopedics;  Laterality: Left;   TUBAL LIGATION      There were no vitals filed for this visit.   Subjective Assessment - 04/03/21 1513     Subjective Pt. stated her Lt knee has continued to hurt and swollen.  Pt. stated back of knee muscle feels sharp pain at times.  Pt. stated off and on bilateral shoulder ache at  times.    Limitations Standing;Lifting;House hold activities;Walking    Patient Stated Goals Reduce pain    Currently in Pain? Yes    Pain Score 6     Pain Location Knee    Pain Orientation Left    Pain Descriptors / Indicators Tightness;Throbbing;Sharp;Sore    Pain Type Chronic pain    Pain Onset More than a month ago    Pain Frequency Intermittent    Aggravating Factors  standing, walking, transfers after sitting    Pain Relieving Factors nothing specific reported    Pain Onset More than a month ago                               [facility] Adult PT Treatment/Exercise - 04/03/21 0001       Knee/Hip Exercises: Stretches   Passive Hamstring Stretch 3 reps;30 seconds;Left   on shuttle leg press   Gastroc Stretch 60 seconds;Both;1 rep      Knee/Hip Exercises: Aerobic   Nustep Lvl 6 10 mins UE/LE      Knee/Hip Exercises: Machines for Strengthening   Cybex Knee Extension eccentric lowering Lt 10 lbs 3 x 10  Knee/Hip Exercises: Seated   Other Seated Knee/Hip Exercises seated slr Lt 2 x 10      Shoulder Exercises: Standing   External Rotation Both   2 x 10 c towel at side   Theraband Level (Shoulder External Rotation) Level 3 (Green)    Row Both   2 x 10   Theraband Level (Shoulder Row) Level 4 (Blue)                       PT Short Term Goals - 03/22/21 1527       PT SHORT TERM GOAL #1   Title Patient will demonstrate independent use of home exercise program to maintain progress from in clinic treatments.    Baseline 03/22/2021: cues required    Time 3    Period Weeks    Status Partially Met    Target Date 03/08/21               PT Long Term Goals - 03/22/21 1544       PT LONG TERM GOAL #1   Title Patient will demonstrate/report pain at worst less than or equal to 2/10 to facilitate minimal limitation in daily activity secondary to pain symptoms.    Time 10    Period Weeks    Status On-going    Target Date 04/26/21      PT  LONG TERM GOAL #2   Title Patient will demonstrate independent use of home exercise program to facilitate ability to maintain/progress functional gains from skilled physical therapy services.    Time 10    Period Weeks    Status On-going    Target Date 04/26/21      PT LONG TERM GOAL #3   Title Pt. will demonstrate FOTO outcome > or = 55% to indicated reduced disability due to condition.    Time 10    Period Weeks    Status On-going    Target Date 04/26/21      PT LONG TERM GOAL #4   Title Pt. will demonstrate Lt shoulder AROM within 75% of Rt to facilitate usual daily activity at PLOF.    Time 10    Period Weeks    Status On-going    Target Date 04/26/21      PT LONG TERM GOAL #5   Title Pt. will demonstrate Lt knee MMT 5/5 throughout to facilitate transfers, ambulation at PLOF s limitation    Time 10    Period Weeks    Status On-going    Target Date 04/26/21      PT LONG TERM GOAL #6   Title Pt. will demonstrate SLS bilateral > 15 seconds to facilitate improved stance, ambulation on even and uneven surfaces.    Time 10    Period Weeks    Status On-going    Target Date 04/26/21                   Plan - 04/03/21 1536     Clinical Impression Statement Pt. arrived again c more noted antalgic gait that improved c use of LE in clinic.  Inclusion of shoulder strengthening due to reported intermittent arm symptoms.  Occasional times of complaints in Lt knee c full extension (leg press, nustep) but improved c repetitive movements.  Continued skilled PT services indicated at this time.    Examination-Activity Limitations Bathing;Sleep;Carry;Lift;Reach Overhead;Bend;Bed Mobility;Squat;Stairs;Stand;Locomotion Level;Transfers    Examination-Participation Restrictions Community Activity;Shop;Laundry;Cleaning    Stability/Clinical Decision Making Evolving/Moderate complexity  Rehab Potential --   fair to good   PT Frequency 2x / week    PT Duration Other (comment)   10  weeks   PT Treatment/Interventions ADLs/Self Care Home Management;Cryotherapy;Electrical Stimulation;Iontophoresis 59m/ml Dexamethasone;Moist Heat;Traction;Balance training;Manual techniques;Therapeutic activities;Therapeutic exercise;Functional mobility training;Stair training;Gait training;DME Instruction;Ultrasound;Neuromuscular re-education;Patient/family education;Passive range of motion;Spinal Manipulations;Joint Manipulations;Dry needling;Taping;Vasopneumatic Device    PT Next Visit Plan LE strengthening, hamstring/calf stretching    PT Home Exercise Plan [id number]    Consulted and Agree with Plan of Care Patient             Patient will benefit from skilled therapeutic intervention in order to improve the following deficits and impairments:  Decreased endurance, Hypomobility, Decreased activity tolerance, Decreased strength, Impaired UE functional use, Pain, Increased fascial restricitons, Decreased mobility, Decreased range of motion, Impaired perceived functional ability, Improper body mechanics, Postural dysfunction, Impaired flexibility, Decreased coordination, Abnormal gait, Increased edema, Decreased balance, Difficulty walking  Visit Diagnosis: Chronic left shoulder pain  Chronic pain of left knee  Muscle weakness (generalized)  Difficulty in walking, not elsewhere classified  Localized edema     Problem List Patient Active Problem List   Diagnosis Date Noted   Status post total left knee replacement 06/06/2020   Positive ANA (antinuclear antibody) 04/25/2020   Primary osteoarthritis of left knee 04/14/2020   Lumbar radiculopathy 06/26/2019   Left-sided low back pain with left-sided sciatica 06/26/2019   Thoracic spinal stenosis 04/18/2019   Seasonal allergies 12/04/2018   Allergic rhinoconjunctivitis 10/10/2018   Moderate persistent asthma with acute exacerbation 10/10/2018   Pruritus 10/10/2018   Aortic valve sclerosis 10/06/2018   Bilateral carotid bruits  10/06/2018   Abnormal stress ECG 10/06/2018   Laboratory examination 10/06/2018   Palpitations 10/06/2018   Total knee replacement status 09/19/2017   Sprain of anterior talofibular ligament of right ankle 12/13/2016   MScot Jun PT, DPT, OCS, ATC 04/03/21  3:50 PM  PHYSICAL THERAPY DISCHARGE SUMMARY  Visits from Start of Care: 4  Current functional level related to goals / functional outcomes: See note   Remaining deficits: See note   Education / Equipment: HEP   Patient agrees to discharge. Patient goals were partially met. Patient is being discharged due to not returning since the last visit.  MScot Jun PT, DPT, OCS, ATC 06/20/21  10:53 AM     [facility] [address] [address]Phone: [phone]  Fax:  [phone] Name: Rachel BEHRINGMRN: [id number] of Birth: [birth year]

## 2021-04-05 ENCOUNTER — Encounter: Payer: Medicare Other | Admitting: Rehabilitative and Restorative Service Providers"

## 2021-04-05 NOTE — ED Provider Notes (Signed)
  [place]   [id number] 03/30/21 Arrival Time: [place]:  1. Fatigue, unspecified type   2. Elevated blood sugar    Fatigue may be exacerbated by elevated blood sugar. Discussed. Labs pending.  Plans f/u with PCP.  Reviewed expectations re: course of current medical issues. Questions answered. Outlined signs and symptoms indicating need for more acute intervention. Understanding verbalized. After Visit Summary given.   SUBJECTIVE: History from: patient. Rachel Crane is a 75 y.o. female who reports fatigue; past few weeks; ques longer. H/O hypoK. Requests "lab work". No CP/SOB. Sleeping normally. Normal bowel/bladder habits. Normal PO intake without n/v/d.   OBJECTIVE:  Vitals:   03/30/21 1910  BP: 136/80  Pulse: 63  Resp: 19  SpO2: 97%    General appearance: alert; no distress Eyes: PERRLA; EOMI; conjunctiva normal Neck: supple  Lungs: speaks full sentences without difficulty; unlabored; CTAB CV: reg Extremities: no edema Skin: warm and dry Neurologic: normal gait Psychological: alert and cooperative; normal mood and affect   Allergies  Allergen Reactions   Hydrocodone-Acetaminophen     Hives and nausea    Past Medical History:  Diagnosis Date   Abnormal stress ECG 10/06/2018   Aortic valve sclerosis 10/06/2018   Arthritis    Asthma    Bilateral carotid bruits 10/06/2018   Headache    H/O bad HA, since using CPAP- she has had improvement with that issue    Hypertension    Laboratory examination 10/06/2018   OSA on CPAP    settting - 8, uses CPAP q night    Pre-diabetes    Shingles    Social History   Socioeconomic History   Marital status: Widowed    Spouse name: Not on file   Number of children: 3   Years of education: Not on file   Highest education level: Not on file  Occupational History   Not on file  Tobacco Use   Smoking status: Never   Smokeless tobacco: Never  Vaping Use   Vaping Use: Never used   Substance and Sexual Activity   Alcohol use: Not Currently   Drug use: No   Sexual activity: Not on file  Other Topics Concern   Not on file  Social History Narrative   Not on file   Social Determinants of Health   Financial Resource Strain: Not on file  Food Insecurity: Not on file  Transportation Needs: Not on file  Physical Activity: Not on file  Stress: Not on file  Social Connections: Not on file  Intimate Partner Violence: Not on file   Family History  Problem Relation Age of Onset   Breast cancer Sister    Hypertension Mother    Arthritis Mother    Past Surgical History:  Procedure Laterality Date   CHOLECYSTECTOMY  1998   TOTAL KNEE ARTHROPLASTY Right 09/19/2017   Procedure: RIGHT TOTAL KNEE ARTHROPLASTY;  Surgeon: Leandrew Koyanagi, MD;  Location: [place];  Service: Orthopedics;  Laterality: Right;   TOTAL KNEE ARTHROPLASTY Left 06/06/2020   Procedure: LEFT TOTAL KNEE ARTHROPLASTY;  Surgeon: Leandrew Koyanagi, MD;  Location: [place];  Service: Orthopedics;  Laterality: Left;   TUBAL LIGATION       Vanessa Kick, MD 04/05/21 1002

## 2021-04-10 ENCOUNTER — Encounter: Payer: Medicare Other | Admitting: Rehabilitative and Restorative Service Providers"

## 2021-04-12 ENCOUNTER — Encounter: Payer: Medicare Other | Admitting: Rehabilitative and Restorative Service Providers"

## 2021-04-14 DIAGNOSIS — M5416 Radiculopathy, lumbar region: Secondary | ICD-10-CM | POA: Diagnosis not present

## 2021-04-19 ENCOUNTER — Other Ambulatory Visit: Payer: Self-pay | Admitting: Internal Medicine

## 2021-04-19 DIAGNOSIS — Z1231 Encounter for screening mammogram for malignant neoplasm of breast: Secondary | ICD-10-CM

## 2021-04-24 DIAGNOSIS — M5416 Radiculopathy, lumbar region: Secondary | ICD-10-CM | POA: Diagnosis not present

## 2021-05-03 DIAGNOSIS — Z23 Encounter for immunization: Secondary | ICD-10-CM | POA: Diagnosis not present

## 2021-05-16 DIAGNOSIS — I10 Essential (primary) hypertension: Secondary | ICD-10-CM | POA: Diagnosis not present

## 2021-05-16 DIAGNOSIS — Z6832 Body mass index (BMI) 32.0-32.9, adult: Secondary | ICD-10-CM | POA: Diagnosis not present

## 2021-05-16 DIAGNOSIS — M48062 Spinal stenosis, lumbar region with neurogenic claudication: Secondary | ICD-10-CM | POA: Diagnosis not present

## 2021-05-17 ENCOUNTER — Other Ambulatory Visit: Payer: Self-pay

## 2021-05-17 ENCOUNTER — Ambulatory Visit
Admission: RE | Admit: 2021-05-17 | Discharge: 2021-05-17 | Disposition: A | Discharge status: Home / Self Care | Payer: Medicare Other | Source: Ambulatory Visit | Attending: Internal Medicine | Admitting: Internal Medicine

## 2021-05-17 DIAGNOSIS — Z1231 Encounter for screening mammogram for malignant neoplasm of breast: Secondary | ICD-10-CM

## 2021-05-23 DIAGNOSIS — M48062 Spinal stenosis, lumbar region with neurogenic claudication: Secondary | ICD-10-CM | POA: Diagnosis not present

## 2021-05-23 DIAGNOSIS — Z6831 Body mass index (BMI) 31.0-31.9, adult: Secondary | ICD-10-CM | POA: Diagnosis not present

## 2021-05-24 ENCOUNTER — Telehealth: Payer: Self-pay | Admitting: Allergy

## 2021-05-24 ENCOUNTER — Other Ambulatory Visit: Payer: Self-pay | Admitting: *Deleted

## 2021-05-24 MED ORDER — LORATADINE 10 MG PO TABS: ORAL_TABLET | ORAL | 1 refills | Status: DC

## 2021-05-24 MED ORDER — TRIAMCINOLONE ACETONIDE 0.1 % EX OINT: 1.0000 "application " | TOPICAL_OINTMENT | Freq: Two times a day (BID) | CUTANEOUS | 5 refills | Status: DC | PRN

## 2021-05-24 MED ORDER — ALBUTEROL SULFATE HFA 108 (90 BASE) MCG/ACT IN AERS: 1.0000 | INHALATION_SPRAY | RESPIRATORY_TRACT | 0 refills | Status: DC | PRN

## 2021-05-24 NOTE — Telephone Encounter (Signed)
Patient called and needs refills on albuterol, clariin, triamcinolone 0.1% cream called to [facility]. [phone]

## 2021-05-24 NOTE — Telephone Encounter (Signed)
Medications have been sent to requested pharmacy. Called the patient and informed. Patient verbalized understanding.

## 2021-05-25 ENCOUNTER — Other Ambulatory Visit: Payer: Self-pay | Admitting: Allergy

## 2021-05-31 DIAGNOSIS — Z23 Encounter for immunization: Secondary | ICD-10-CM | POA: Diagnosis not present

## 2021-06-07 ENCOUNTER — Ambulatory Visit: Payer: Medicare Other | Admitting: Cardiology

## 2021-06-16 DIAGNOSIS — E1169 Type 2 diabetes mellitus with other specified complication: Secondary | ICD-10-CM | POA: Diagnosis not present

## 2021-06-16 DIAGNOSIS — D638 Anemia in other chronic diseases classified elsewhere: Secondary | ICD-10-CM | POA: Diagnosis not present

## 2021-06-16 DIAGNOSIS — I10 Essential (primary) hypertension: Secondary | ICD-10-CM | POA: Diagnosis not present

## 2021-06-16 DIAGNOSIS — Z7984 Long term (current) use of oral hypoglycemic drugs: Secondary | ICD-10-CM | POA: Diagnosis not present

## 2021-06-21 DIAGNOSIS — Z7984 Long term (current) use of oral hypoglycemic drugs: Secondary | ICD-10-CM | POA: Diagnosis not present

## 2021-06-21 DIAGNOSIS — E1169 Type 2 diabetes mellitus with other specified complication: Secondary | ICD-10-CM | POA: Diagnosis not present

## 2021-06-26 ENCOUNTER — Ambulatory Visit: Payer: Medicare Other | Admitting: Adult Health

## 2021-06-27 ENCOUNTER — Telehealth (INDEPENDENT_AMBULATORY_CARE_PROVIDER_SITE_OTHER): Payer: Medicare Other | Admitting: Adult Health

## 2021-06-27 DIAGNOSIS — Z9989 Dependence on other enabling machines and devices: Secondary | ICD-10-CM | POA: Diagnosis not present

## 2021-06-27 DIAGNOSIS — G4733 Obstructive sleep apnea (adult) (pediatric): Secondary | ICD-10-CM

## 2021-06-27 NOTE — Progress Notes (Signed)
°  [facility] [address] [place]. [place] [address] [phone]  PRIMARY NEUROLOGIST:    Virtual Visit via Telephone Note  I connected with [name] on 06/27/21 at  3:30 PM EST by telephone located remotely at [facility] and verified that I am speaking with the correct person using two identifiers who reports being located at home.    Visit scheduled by me. She discussed the limitations, risks, security and privacy concerns of performing an evaluation and management service by telephone and the availability of in person appointments. I also discussed with the patient that there may be a patient responsible charge related to this service. The patient expressed understanding and agreed to proceed. See telephone note for consent and additional scheduling information.    History of Present Illness:  Rachel Crane is a 75 y.o. female who has been followed in this office for OSA on CPAP. Reports CPAP is working well. Feels that air is too hot. Denies any other issues.     Observations/Objective:  Generalized: Well developed, in no acute distress   Neurological examination  Mentation: Alert oriented to time, place, history taking. Follows all commands speech and language fluent  Assessment and Plan:  1: OSA on CPAP  Residual AHI 5.6 will increase pressure to 10cmH20 Will ask DME to turn off heated tubing    Follow Up Instructions:   F/U in 1 year or sooner if needed.    I discussed the assessment and treatment plan with the patient.  The patient was provided an opportunity to ask questions and all were answered to their satisfaction. The patient agreed with the plan and verbalized an understanding of the instructions.   I provided 10 minutes of non-face-to-face time during this encounter.    Ward Givens NP-C  [facility] [address] [place], [place] [address]  Phone [phone] Fax  [phone] \

## 2021-06-29 ENCOUNTER — Other Ambulatory Visit: Payer: Self-pay | Admitting: Allergy

## 2021-06-29 ENCOUNTER — Other Ambulatory Visit: Payer: Self-pay | Admitting: Physical Medicine and Rehabilitation

## 2021-06-29 NOTE — Progress Notes (Signed)
[name], RN; [name] got it!      Previous Messages   ----- Message -----  From: Brandon Melnick, RN  Sent: 06/27/2021   4:45 PM EST  To: Ocie Bob, *  Subject: pressure increase                               New order in epic for pressure change.   Rachel Crane  Female, 75 y.o., [birth year]  Pronouns:  she/her/hers  MRN:  [id number]  Thanks SY

## 2021-06-30 ENCOUNTER — Other Ambulatory Visit: Payer: Self-pay

## 2021-06-30 ENCOUNTER — Telehealth: Payer: Self-pay | Admitting: Allergy

## 2021-06-30 ENCOUNTER — Encounter: Payer: Self-pay | Admitting: Cardiology

## 2021-06-30 ENCOUNTER — Ambulatory Visit: Payer: Medicare Other | Admitting: Cardiology

## 2021-06-30 VITALS — BP 135/73 | HR 78 | Resp 16 | Ht 66.0 in | Wt 202.0 lb

## 2021-06-30 DIAGNOSIS — E782 Mixed hyperlipidemia: Secondary | ICD-10-CM | POA: Diagnosis not present

## 2021-06-30 DIAGNOSIS — E6609 Other obesity due to excess calories: Secondary | ICD-10-CM | POA: Diagnosis not present

## 2021-06-30 DIAGNOSIS — I10 Essential (primary) hypertension: Secondary | ICD-10-CM | POA: Diagnosis not present

## 2021-06-30 DIAGNOSIS — Z6832 Body mass index (BMI) 32.0-32.9, adult: Secondary | ICD-10-CM | POA: Diagnosis not present

## 2021-06-30 MED ORDER — TRIAMCINOLONE ACETONIDE 0.1 % EX OINT: 1.0000 "application " | TOPICAL_OINTMENT | Freq: Two times a day (BID) | CUTANEOUS | 1 refills | Status: DC | PRN

## 2021-06-30 NOTE — Telephone Encounter (Signed)
Spoke with patient, she stated she went to pick it up and it was a tube instead of the jar and would like the jar instead of the tube. I informed her that the jar of triamcinolone has been sent to the requested pharmacy. Patient verbalized understanding.

## 2021-06-30 NOTE — Telephone Encounter (Signed)
Patient is requesting a refill on her triamcinolone ointment. She states she needs the ointment that is in the "jar". Patient would like to be informed if this can be sent in today.  [place] [facility] [employer]

## 2021-06-30 NOTE — Progress Notes (Signed)
Rachel Crane Date of Birth: June 23, 1946 MRN: [id number] Primary Care Provider:Varadarajan, Ronie Spies, MD Former Cardiology Providers: Jeri Lager, APRN, FNP-C Primary Cardiologist: Rex Kras, DO, [facility] (established care 04/26/2020)  Date: 06/30/21 Last Office Visit: 08/18/2020  Chief Complaint  Patient presents with   Follow-up    Annual follow-up visit    HPI  Rachel Crane is a 75 y.o.  female who presents to the office with a chief complaint of " 1 year follow-up visit." Patient's past medical history and cardiovascular risk factors include: Hypertension, Hyperlipidemia, prediabetes, sleep apnea currently on CPAP, postmenopausal female, advanced age.  Patient was originally referred to the office in October 2021 for preoperative risk stratification and since then has been following up on an annual basis.  Patient presents today for a sooner office visit as she is planning to go to [place].  Clinically over the last year patient states that she is doing well from a cardiovascular standpoint.  No hospitalizations or urgent care visits for cardiovascular symptoms.  Patient denies angina pectoris or heart failure symptoms.  Home blood pressures are well controlled.  Medications reconciled.  ALLERGIES: Allergies  Allergen Reactions   Hydrocodone-Acetaminophen     Hives and nausea   MEDICATION LIST PRIOR TO VISIT: Current Outpatient Medications on File Prior to Visit  Medication Sig Dispense Refill   albuterol (VENTOLIN HFA) 108 (90 Base) MCG/ACT inhaler Inhale 1-2 puffs into the lungs every 4 (four) hours as needed for wheezing or shortness of breath. 8 g 0   azelastine (ASTELIN) 0.1 % nasal spray Place 2 sprays into both nostrils 2 (two) times daily as needed (Nasal drainage). Use in each nostril as directed 30 mL 5   budesonide-formoterol (SYMBICORT) 80-4.5 MCG/ACT inhaler 2 Puffs 2 times daily 1 each 0   chlorthalidone (HYGROTON) 25 MG tablet Take 25 mg by mouth daily.       cholecalciferol (VITAMIN D) 25 MCG (1000 UNIT) tablet Take 1,000 Units by mouth daily.     latanoprost (XALATAN) 0.005 % ophthalmic solution      loratadine (CLARITIN) 10 MG tablet TAKE 1 TABLET(10 MG) BY MOUTH DAILY 90 tablet 1   losartan (COZAAR) 100 MG tablet Take 1 tablet (100 mg total) by mouth daily. 30 tablet 3   Potassium Chloride ER 20 MEQ TBCR Take 1 tablet by mouth daily.     pravastatin (PRAVACHOL) 40 MG tablet Take 40 mg by mouth daily.      JARDIANCE 10 MG TABS tablet Take 10 mg by mouth daily.     No current facility-administered medications on file prior to visit.    PAST MEDICAL HISTORY: Past Medical History:  Diagnosis Date   Abnormal stress ECG 10/06/2018   Aortic valve sclerosis 10/06/2018   Arthritis    Asthma    Bilateral carotid bruits 10/06/2018   Headache    H/O bad HA, since using CPAP- she has had improvement with that issue    Hypertension    Laboratory examination 10/06/2018   OSA on CPAP    settting - 8, uses CPAP q night    Pre-diabetes    Shingles     PAST SURGICAL HISTORY: Past Surgical History:  Procedure Laterality Date   CHOLECYSTECTOMY  1998   TOTAL KNEE ARTHROPLASTY Right 09/19/2017   Procedure: RIGHT TOTAL KNEE ARTHROPLASTY;  Surgeon: Leandrew Koyanagi, MD;  Location: [place];  Service: Orthopedics;  Laterality: Right;   TOTAL KNEE ARTHROPLASTY Left 06/06/2020   Procedure: LEFT TOTAL KNEE ARTHROPLASTY;  Surgeon: Leandrew Koyanagi, MD;  Location: [place];  Service: Orthopedics;  Laterality: Left;   TUBAL LIGATION      FAMILY HISTORY: The patient's family history includes Arthritis in her mother; Breast cancer in her sister; Hypertension in her mother.   SOCIAL HISTORY:  The patient  reports that she has never smoked. She has never used smokeless tobacco. She reports that she does not currently use alcohol. She reports that she does not use drugs.  Review of Systems  Constitutional: Negative for chills and fever.  HENT:  Negative for hoarse voice and  nosebleeds.   Eyes:  Negative for discharge, double vision and pain.  Cardiovascular:  Negative for chest pain, claudication, dyspnea on exertion, leg swelling, near-syncope, orthopnea, palpitations, paroxysmal nocturnal dyspnea and syncope.  Respiratory:  Negative for hemoptysis and shortness of breath.   Musculoskeletal:  Positive for back pain and joint pain. Negative for muscle cramps and myalgias.  Gastrointestinal:  Negative for abdominal pain, constipation, diarrhea, hematemesis, hematochezia, melena, nausea and vomiting.  Neurological:  Negative for dizziness and light-headedness.   PHYSICAL EXAM: Vitals with BMI 06/30/2021 03/31/2021 03/30/2021  Height 5\' 6"  5\' 6"  -  Weight 202 lbs 208 lbs 10 oz -  BMI 09.32 67.12 -  Systolic 458 099 833  Diastolic 73 68 80  Pulse 78 73 63   CONSTITUTIONAL: Well-developed and well-nourished. No acute distress.  SKIN: Skin is warm and dry. No rash noted. No cyanosis. No pallor. No jaundice HEAD: Normocephalic and atraumatic.  EYES: No scleral icterus MOUTH/THROAT: Moist oral membranes.  NECK: No JVD present. No thyromegaly noted. No carotid bruits  LYMPHATIC: No visible cervical adenopathy.  CHEST Normal respiratory effort. No intercostal retractions  LUNGS: Clear to auscultation bilaterally.  No stridor. No wheezes. No rales.  CARDIOVASCULAR: Regular rate and rhythm, positive S1-S2, no murmurs rubs or gallops appreciated. ABDOMINAL: Obese, soft, nontender, nondistended, positive bowel sounds in all 4 quadrants, no apparent ascites.  EXTREMITIES: No peripheral edema, 2+ DP and PT pulses. HEMATOLOGIC: No significant bruising NEUROLOGIC: Oriented to person, place, and time. Nonfocal. Normal muscle tone.  PSYCHIATRIC: Normal mood and affect. Normal behavior. Cooperative  CARDIAC DATABASE: EKG: 06/30/2021: Normal sinus rhythm, 76 bpm, nonspecific T wave abnormality, without underlying injury pattern.  Echocardiogram:  01/19/2017: LVEF 68%, grade  2 diastolic impairment, moderate LVH, trace MR, trace TR.  04/28/2020: Left ventricle cavity is normal in size. Moderate concentric hypertrophy of the left ventricle. Normal global wall motion. Normal LV systolic function with EF 64%. Doppler evidence of grade I (impaired) diastolic dysfunction, normal LAP. Left atrial cavity is mildly dilated. Aneurysmal interatrial septum without 2D or color Doppler evidence of interatrial shunt. Mild tricuspid regurgitation. Peak RA-RV gradient 17 mmHg. The IVC is not well visualized. No significant change compared to previous study in 2018.    Stress Testing:  Lexiscan Tetrofosmin stress test 05/02/2020: Lexiscan nuclear stress test performed using 1-day protocol. SPECT images show large areas of breast attenuation, with imaging performed in sitting position. There is small area of mild intensity decrease in tracer uptake, in basal inferolateral myocardium, more prominent on stress images. While attenuation artifact is likely, small area of ischemia in this region cannot be excluded.  Stress LVEF 76%. Low risk study.   Heart Catheterization: None  LABORATORY DATA: CBC Latest Ref Rng & Units 03/30/2021 03/06/2021 06/07/2020  WBC 4.0 - 10.5 K/uL 6.5 15.6(H) 9.5  Hemoglobin 12.0 - 15.0 g/dL 11.6(L) 13.0 9.5(L)  Hematocrit 36.0 - 46.0 % 35.4(L) 39.2  29.5(L)  Platelets 150 - 400 K/uL 260 252 214    CMP Latest Ref Rng & Units 03/30/2021 03/06/2021 06/07/2020  Glucose 70 - 99 mg/dL 121(H) 244(H) 131(H)  BUN 8 - 23 mg/dL 15 26(H) 25(H)  Creatinine 0.44 - 1.00 mg/dL 0.99 1.22(H) 1.29(H)  Sodium 135 - 145 mmol/L 138 135 137  Potassium 3.5 - 5.1 mmol/L 3.7 3.1(L) 3.6  Chloride 98 - 111 mmol/L 101 97(L) 101  CO2 22 - 32 mmol/L 27 27 27   Calcium 8.9 - 10.3 mg/dL 9.7 9.6 8.9  Total Protein 6.5 - 8.1 g/dL - 7.4 -  Total Bilirubin 0.3 - 1.2 mg/dL - 0.6 -  Alkaline Phos 38 - 126 U/L - 75 -  AST 15 - 41 U/L - 20 -  ALT 0 - 44 U/L - 26 -    Lipid Panel      Component Value Date/Time   CHOL 179 10/06/2018 1012   TRIG 76 10/06/2018 1012   HDL 50 10/06/2018 1012   CHOLHDL 3.6 10/06/2018 1012   LDLCALC 114 (H) 10/06/2018 1012   LABVLDL 15 10/06/2018 1012    Lab Results  Component Value Date   HGBA1C 7.4 (H) 03/30/2021   No components found for: NTPROBNP Lab Results  Component Value Date   TSH 1.325 03/30/2021   TSH 0.980 10/23/2018   TSH 1.200 05/12/2018    Cardiac Panel (last 3 results) No results for input(s): CKTOTAL, CKMB, TROPONINIHS, RELINDX in the last 72 hours.  IMPRESSION:    ICD-10-CM   1. Essential hypertension  I10 EKG 12-Lead    2. Mixed hyperlipidemia  E78.2     3. Class 1 obesity due to excess calories without serious comorbidity with body mass index (BMI) of 32.0 to 32.9 in adult  E66.09    Z68.32        RECOMMENDATIONS: Rachel Crane is a 75 y.o. female whose past medical history and cardiovascular risk factors include: Hypertension, Hyperlipidemia, prediabetes, sleep apnea currently on CPAP, postmenopausal female, advanced age.  Initially referred to the practice for preoperative risk stratification; however, now follows up on an annual basis given her age and cardiovascular risk factors.  Since last office visit patient has been stable from a cardiovascular standpoint.  Patient's blood pressures are within acceptable range.  Patient is working on lifestyle changes to improve her cholesterol and glycemic levels.  She follows up with her PCP regularly for the management of her other chronic comorbid conditions.  Over the last 1 year she has not had any angina pectoris or symptoms to suggest congestive heart failure.  Recommend continuing current medical therapy and improving her modifiable cardiovascular risk factors by increasing physical activity as tolerated, glycemic control, cholesterol and blood pressure management.  Patient wishes to follow-up on an annual basis.  No orders or testing ordered during  this encounter.  Total time spent: 20 minutes.  FINAL MEDICATION LIST END OF ENCOUNTER: No orders of the defined types were placed in this encounter.    Current Outpatient Medications:    albuterol (VENTOLIN HFA) 108 (90 Base) MCG/ACT inhaler, Inhale 1-2 puffs into the lungs every 4 (four) hours as needed for wheezing or shortness of breath., Disp: 8 g, Rfl: 0   azelastine (ASTELIN) 0.1 % nasal spray, Place 2 sprays into both nostrils 2 (two) times daily as needed (Nasal drainage). Use in each nostril as directed, Disp: 30 mL, Rfl: 5   budesonide-formoterol (SYMBICORT) 80-4.5 MCG/ACT inhaler, 2 Puffs 2 times daily, Disp:  1 each, Rfl: 0   chlorthalidone (HYGROTON) 25 MG tablet, Take 25 mg by mouth daily. , Disp: , Rfl:    cholecalciferol (VITAMIN D) 25 MCG (1000 UNIT) tablet, Take 1,000 Units by mouth daily., Disp: , Rfl:    latanoprost (XALATAN) 0.005 % ophthalmic solution, , Disp: , Rfl:    loratadine (CLARITIN) 10 MG tablet, TAKE 1 TABLET(10 MG) BY MOUTH DAILY, Disp: 90 tablet, Rfl: 1   losartan (COZAAR) 100 MG tablet, Take 1 tablet (100 mg total) by mouth daily., Disp: 30 tablet, Rfl: 3   Potassium Chloride ER 20 MEQ TBCR, Take 1 tablet by mouth daily., Disp: , Rfl:    pravastatin (PRAVACHOL) 40 MG tablet, Take 40 mg by mouth daily. , Disp: , Rfl:    triamcinolone ointment (KENALOG) 0.1 %, Apply 1 application topically 2 (two) times daily as needed., Disp: 454 g, Rfl: 1   JARDIANCE 10 MG TABS tablet, Take 10 mg by mouth daily., Disp: , Rfl:   Orders Placed This Encounter  Procedures   EKG 12-Lead   --Continue cardiac medications as reconciled in final medication list. --Return in about 1 year (around 06/30/2022) for Annual Visit. Or sooner if needed. --Continue follow-up with your primary care physician regarding the management of your other chronic comorbid conditions.  Patient's questions and concerns were addressed to her satisfaction. She voices understanding of the instructions  provided during this encounter.   This note was created using a voice recognition software as a result there may be grammatical errors inadvertently enclosed that do not reflect the nature of this encounter. Every attempt is made to correct such errors.  Rex Kras, [place], [facility]  Pager: [phone] Office: [phone]

## 2021-07-18 DIAGNOSIS — Z20822 Contact with and (suspected) exposure to covid-19: Secondary | ICD-10-CM | POA: Diagnosis not present

## 2021-08-17 ENCOUNTER — Ambulatory Visit: Payer: Medicare Other | Admitting: Cardiology

## 2021-08-24 DIAGNOSIS — Z20822 Contact with and (suspected) exposure to covid-19: Secondary | ICD-10-CM | POA: Diagnosis not present

## 2021-09-24 ENCOUNTER — Telehealth: Payer: Self-pay | Admitting: *Deleted

## 2021-09-24 NOTE — Telephone Encounter (Signed)
Attempted 1 year Ortho bundle call to patient. No answer and left VM. ?

## 2021-09-26 ENCOUNTER — Telehealth: Payer: Self-pay | Admitting: *Deleted

## 2021-09-26 NOTE — Telephone Encounter (Signed)
Attempted 2nd Ortho bundle call for 1 year post op to patient. Left VM and requested call back. ?

## 2021-10-17 DIAGNOSIS — E7800 Pure hypercholesterolemia, unspecified: Secondary | ICD-10-CM | POA: Diagnosis not present

## 2021-10-17 DIAGNOSIS — I10 Essential (primary) hypertension: Secondary | ICD-10-CM | POA: Diagnosis not present

## 2021-10-17 DIAGNOSIS — E1169 Type 2 diabetes mellitus with other specified complication: Secondary | ICD-10-CM | POA: Diagnosis not present

## 2021-10-17 DIAGNOSIS — G894 Chronic pain syndrome: Secondary | ICD-10-CM | POA: Diagnosis not present

## 2021-10-27 ENCOUNTER — Ambulatory Visit (HOSPITAL_COMMUNITY)
Admission: EM | Admit: 2021-10-27 | Discharge: 2021-10-27 | Disposition: A | Discharge status: Home / Self Care | Payer: Medicare Other | Attending: Internal Medicine | Admitting: Internal Medicine

## 2021-10-27 ENCOUNTER — Encounter (HOSPITAL_COMMUNITY): Payer: Self-pay | Admitting: Emergency Medicine

## 2021-10-27 DIAGNOSIS — J4521 Mild intermittent asthma with (acute) exacerbation: Secondary | ICD-10-CM | POA: Diagnosis not present

## 2021-10-27 DIAGNOSIS — J029 Acute pharyngitis, unspecified: Secondary | ICD-10-CM

## 2021-10-27 LAB — POCT RAPID STREP A, ED / UC: Streptococcus, Group A Screen (Direct): NEGATIVE

## 2021-10-27 MED ORDER — GUAIFENESIN-CODEINE 100-10 MG/5ML PO SOLN: 5.0000 mL | Freq: Every evening | ORAL | 0 refills | Status: DC | PRN

## 2021-10-27 MED ORDER — BENZONATATE 200 MG PO CAPS: 200.0000 mg | ORAL_CAPSULE | Freq: Three times a day (TID) | ORAL | 0 refills | Status: DC | PRN

## 2021-10-27 MED ORDER — PREDNISONE 20 MG PO TABS: 20.0000 mg | ORAL_TABLET | Freq: Every day | ORAL | 0 refills | Status: DC

## 2021-10-27 NOTE — ED Triage Notes (Signed)
Had sore throat since Sunday, dry cough and congestion. Pt c/o pains in chest with coughing.  ?

## 2021-10-27 NOTE — ED Provider Notes (Signed)
?[facility] ? ? ? ?CSN: [id number] ?Arrival date & time: 10/27/21  1725 ? ? ?  ? ?History   ?Chief Complaint ?Chief Complaint  ?Patient presents with  ? Sore Throat  ? Nasal Congestion  ? ? ?HPI ?Rachel Crane is a 76 y.o. female presents with onset of ST x 5 days, non productive cough and stuffy nose. She was exposed to dust and seemed to make her cough worse. She has not had a fever. She has been gargling with warm salt water, and helps but the ST comes back. She has a tickle on the lower throat that provokes her cough. She cant sleep due to the cough.  ? ? ? ?Past Medical History:  ?Diagnosis Date  ? Abnormal stress ECG 10/06/2018  ? Aortic valve sclerosis 10/06/2018  ? Arthritis   ? Asthma   ? Bilateral carotid bruits 10/06/2018  ? Headache   ? H/O bad HA, since using CPAP- she has had improvement with that issue   ? Hypertension   ? Laboratory examination 10/06/2018  ? OSA on CPAP   ? settting - 8, uses CPAP q night   ? Pre-diabetes   ? Shingles   ? ? ?Patient Active Problem List  ? Diagnosis Date Noted  ? Status post total left knee replacement 06/06/2020  ? Positive ANA (antinuclear antibody) 04/25/2020  ? Primary osteoarthritis of left knee 04/14/2020  ? Lumbar radiculopathy 06/26/2019  ? Left-sided low back pain with left-sided sciatica 06/26/2019  ? Thoracic spinal stenosis 04/18/2019  ? Seasonal allergies 12/04/2018  ? Allergic rhinoconjunctivitis 10/10/2018  ? Moderate persistent asthma with acute exacerbation 10/10/2018  ? Pruritus 10/10/2018  ? Aortic valve sclerosis 10/06/2018  ? Bilateral carotid bruits 10/06/2018  ? Abnormal stress ECG 10/06/2018  ? Laboratory examination 10/06/2018  ? Palpitations 10/06/2018  ? Total knee replacement status 09/19/2017  ? Sprain of anterior talofibular ligament of right ankle 12/13/2016  ? ? ?Past Surgical History:  ?Procedure Laterality Date  ? CHOLECYSTECTOMY  1998  ? TOTAL KNEE ARTHROPLASTY Right 09/19/2017  ? Procedure: RIGHT TOTAL KNEE ARTHROPLASTY;   Surgeon: Leandrew Koyanagi, MD;  Location: [place];  Service: Orthopedics;  Laterality: Right;  ? TOTAL KNEE ARTHROPLASTY Left 06/06/2020  ? Procedure: LEFT TOTAL KNEE ARTHROPLASTY;  Surgeon: Leandrew Koyanagi, MD;  Location: [place];  Service: Orthopedics;  Laterality: Left;  ? TUBAL LIGATION    ? ? ?OB History   ?No obstetric history on file. ?  ? ? ? ?Home Medications   ? ?Prior to Admission medications   ?Medication Sig Start Date End Date Taking? Authorizing Provider  ?benzonatate (TESSALON) 200 MG capsule Take 1 capsule (200 mg total) by mouth 3 (three) times daily as needed for cough. 10/27/21  Yes Rodriguez-Southworth, Sunday Spillers, PA-C  ?guaiFENesin-codeine 100-10 MG/5ML syrup Take 5 mLs by mouth at bedtime as needed for cough. 10/27/21  Yes Rodriguez-Southworth, Sunday Spillers, PA-C  ?predniSONE (DELTASONE) 20 MG tablet Take 1 tablet (20 mg total) by mouth daily with breakfast. 10/27/21  Yes Rodriguez-Southworth, Sunday Spillers, PA-C  ?albuterol (VENTOLIN HFA) 108 (90 Base) MCG/ACT inhaler Inhale 1-2 puffs into the lungs every 4 (four) hours as needed for wheezing or shortness of breath. 05/24/21   Kennith Gain, MD  ?azelastine (ASTELIN) 0.1 % nasal spray Place 2 sprays into both nostrils 2 (two) times daily as needed (Nasal drainage). Use in each nostril as directed 03/31/21   Kennith Gain, MD  ?budesonide-formoterol [facility]) 80-4.5 MCG/ACT inhaler 2 Puffs 2  times daily 08/02/20   Kennith Gain, MD  ?chlorthalidone (HYGROTON) 25 MG tablet Take 25 mg by mouth daily.  11/03/19   [provider]  ?cholecalciferol (VITAMIN D) 25 MCG (1000 UNIT) tablet Take 1,000 Units by mouth daily.    [provider]  ?JARDIANCE 10 MG TABS tablet Take 10 mg by mouth daily. 06/28/21   [provider]  ?latanoprost (XALATAN) 0.005 % ophthalmic solution  02/05/21   [provider]  ?loratadine (CLARITIN) 10 MG tablet TAKE 1 TABLET(10 MG) BY MOUTH DAILY 05/24/21   Kennith Gain, MD   ?losartan (COZAAR) 100 MG tablet Take 1 tablet (100 mg total) by mouth daily. 12/04/18   [name], NP  ?Potassium Chloride ER 20 MEQ TBCR Take 1 tablet by mouth daily. 08/09/20   [provider]  ?pravastatin (PRAVACHOL) 40 MG tablet Take 40 mg by mouth daily.  03/31/19   [provider]  ?triamcinolone ointment (KENALOG) 0.1 % Apply 1 application topically 2 (two) times daily as needed. 06/30/21   Kennith Gain, MD  ? ? ?Family History ?Family History  ?Problem Relation Age of Onset  ? Breast cancer Sister   ? Hypertension Mother   ? Arthritis Mother   ? ? ?Social History ?Social History  ? ?Tobacco Use  ? Smoking status: Never  ? Smokeless tobacco: Never  ?Vaping Use  ? Vaping Use: Never used  ?Substance Use Topics  ? Alcohol use: Not Currently  ? Drug use: No  ? ? ? ?Allergies   ?Hydrocodone-acetaminophen ? ? ?Review of Systems ?Review of Systems  ?Constitutional:  Positive for appetite change. Negative for chills, diaphoresis, fatigue and fever.  ?HENT:  Positive for congestion, postnasal drip, rhinorrhea and sore throat. Negative for ear discharge and ear pain.   ?Eyes:  Positive for itching.  ?     Tearing eyes   ?Respiratory:  Positive for cough and wheezing.   ?Musculoskeletal:  Negative for myalgias.  ?Neurological:  Negative for headaches.  ? ? ?Physical Exam ?Triage Vital Signs ?ED Triage Vitals  ?Enc Vitals Group  ?   BP 10/27/21 1829 130/79  ?   Pulse Rate 10/27/21 1829 82  ?   Resp 10/27/21 1829 19  ?   Temp 10/27/21 1829 99.7 ?F (37.6 ?C)  ?   Temp Source 10/27/21 1829 Oral  ?   SpO2 10/27/21 1829 96 %  ?   Weight --   ?   Height --   ?   Head Circumference --   ?   Peak Flow --   ?   Pain Score 10/27/21 1827 6  ?   Pain Loc --   ?   Pain Edu? --   ?   Excl. in [place]? --   ? ?No data found. ? ?Updated Vital Signs ?BP 130/79 (BP Location: Left Arm)   Pulse 82   Temp 99.7 ?F (37.6 ?C) (Oral)   Resp 19   SpO2 96%  ? ?Visual Acuity ?Right Eye Distance:   ?Left Eye  Distance:   ?Bilateral Distance:   ? ?Right Eye Near:   ?Left Eye Near:    ?Bilateral Near:    ? ? ?Physical Exam ?Vitals signs and nursing note reviewed.  ?Constitutional:   ?   General: She is not in acute distress. ?   Appearance: Normal appearance. She is not ill-appearing, toxic-appearing or diaphoretic.  ?HENT:  ?   Head: Normocephalic.  ?   Right Ear:  Tympanic membrane, ear canal and external ear normal.  ?   Left Ear: Tympanic membrane, ear canal and external ear normal.  ?   Nose: with moderate congestion and clear mucous ?   Mouth/Throat: erythematous ?   Mouth: Mucous membranes are moist.  ?Eyes:  ?   General: No scleral icterus.    ?   Right eye: No discharge.     ?   Left eye: No discharge.  ?   Conjunctiva/sclera: Conjunctivae normal.  ?Neck:  ?   Musculoskeletal: Neck supple. No neck rigidity.  ?Cardiovascular:  ?   Rate and Rhythm: Normal rate and regular rhythm.  ?   Heart sounds: No murmur.  ?Pulmonary:  ?   Effort: Pulmonary effort is normal.  ?   Breath sounds: Normal breath sounds.  ?Musculoskeletal: Normal range of motion. Her lateral ribs are tender to palpation, but no crepitations are present.  ?Lymphadenopathy:  ?   Cervical: No cervical adenopathy.  ?Skin: ?   General: Skin is warm and dry.  ?   Coloration: Skin is not jaundiced.  ?   Findings: No rash.  ?Neurological:  ?   Mental Status: She is alert and oriented to person, place, and time.  ?   Gait: Gait normal.  ?Psychiatric:     ?   Mood and Affect: Mood normal.     ?   Behavior: Behavior normal.     ?   Thought Content: Thought content normal.     ?   Judgment: Judgment normal.  ? ? ?UC Treatments / Results  ?Labs ?(all labs ordered are listed, but only abnormal results are displayed) ?Labs Reviewed  ?CULTURE, GROUP A STREP [facility])  ?POCT RAPID STREP A, ED / UC  ?Rapid strep is neg ? ?EKG ? ? ?Radiology ?No results found. ? ?Procedures ?Procedures (including critical care time) ? ?Medications Ordered in UC ?Medications - No data to  display ? ?Initial Impression / Assessment and Plan / UC Course  ?I have reviewed the triage vital signs and the nursing notes. ?Pertinent labs  results that were available during my care of the patient were revi

## 2021-10-30 LAB — CULTURE, GROUP A STREP (THRC)

## 2021-11-01 ENCOUNTER — Ambulatory Visit: Payer: Medicare Other | Admitting: Orthopaedic Surgery

## 2021-11-02 ENCOUNTER — Ambulatory Visit: Payer: Medicare Other | Admitting: Allergy

## 2021-11-02 ENCOUNTER — Ambulatory Visit: Payer: Medicare Other | Admitting: Orthopaedic Surgery

## 2021-11-03 ENCOUNTER — Ambulatory Visit (INDEPENDENT_AMBULATORY_CARE_PROVIDER_SITE_OTHER): Payer: Medicare Other | Admitting: Family Medicine

## 2021-11-03 ENCOUNTER — Encounter: Payer: Self-pay | Admitting: Family Medicine

## 2021-11-03 VITALS — BP 120/84 | HR 79 | Temp 97.7°F | Resp 16 | Ht 66.0 in | Wt 204.0 lb

## 2021-11-03 DIAGNOSIS — K2100 Gastro-esophageal reflux disease with esophagitis, without bleeding: Secondary | ICD-10-CM

## 2021-11-03 DIAGNOSIS — J3089 Other allergic rhinitis: Secondary | ICD-10-CM

## 2021-11-03 DIAGNOSIS — L299 Pruritus, unspecified: Secondary | ICD-10-CM | POA: Diagnosis not present

## 2021-11-03 DIAGNOSIS — H1010 Acute atopic conjunctivitis, unspecified eye: Secondary | ICD-10-CM

## 2021-11-03 DIAGNOSIS — H1013 Acute atopic conjunctivitis, bilateral: Secondary | ICD-10-CM | POA: Diagnosis not present

## 2021-11-03 DIAGNOSIS — J4540 Moderate persistent asthma, uncomplicated: Secondary | ICD-10-CM | POA: Diagnosis not present

## 2021-11-03 MED ORDER — MONTELUKAST SODIUM 10 MG PO TABS: 10.0000 mg | ORAL_TABLET | Freq: Every day | ORAL | 5 refills | Status: DC

## 2021-11-03 NOTE — Progress Notes (Signed)
? ?[place] [address] ?Dept: [phone] ? ?FOLLOW UP NOTE ? ?Patient ID: Rachel Crane, female    DOB: Oct 28, 1945  Age: 76 y.o. MRN: [id number] ?Date of Office Visit: 11/03/2021 ? ?Assessment  ?Chief Complaint: Wheezing and Cough ? ?HPI ?Rachel Crane is a 76 year old female who presents to the clinic for a follow-up visit.  She was last seen in this clinic on 03/31/2021 by Dr. Nelva Bush for evaluation of asthma, allergic rhinitis, pruritus, and reflux.  In the interim, she visited urgent care clinic on 10/27/2021 for evaluation of asthma with acute exacerbation and cough.  At that time, she was provided with a short prednisone taper, benzonatate, and guaifenesin/codeine syrup.  At today's visit, she reports her asthma has been moderately well controlled with symptoms including occasional wheeze and frequent cough producing clear thick mucus.  She continues Symbicort only as needed and is not currently using albuterol.  She reports that she does not have an albuterol inhaler.  Allergic rhinitis is reported as poorly controlled with symptoms including occasional clear rhinorrhea and postnasal drainage with frequent cough and throat clearing.  She continues Claritin as needed which is infrequently and rare use of Flonase nasal spray.  She is not currently using a nasal saline rinse or azelastine.  Her last environmental allergy testing was on 01/07/2017 and was positive to dust mite, pets, grass pollen, mold, cockroach, tree pollen, weed pollen, and ragweed pollen via blood work.  She reports that she does not like to take medications on a regular basis and is not interested in long-term allergy injections at this time.  Reflux is reported as well controlled with no symptoms including heartburn or vomiting.  She is not currently taking a medication to control reflux.  Pruritus is reported as moderately well controlled with a daily moisturizing routine as well as occasional use of triamcinolone 0.1%  cream.  Her current medications are listed in the chart. ? ? ?Drug Allergies:  ?Allergies  ?Allergen Reactions  ? Hydrocodone-Acetaminophen   ?  Hives and nausea  ? ? ?Physical Exam: ?BP 120/84   Pulse 79   Temp 97.7 ?F (36.5 ?C)   Resp 16   Ht '5\' 6"'$  (1.676 m)   Wt 204 lb (92.5 kg)   SpO2 97%   BMI 32.93 kg/m?   ? ?Physical Exam ?Vitals reviewed.  ?Constitutional:   ?   Appearance: Normal appearance.  ?HENT:  ?   Head: Normocephalic and atraumatic.  ?   Right Ear: Tympanic membrane normal.  ?   Left Ear: Tympanic membrane normal.  ?   Nose:  ?   Comments: Bilateral nares slightly erythematous with clear nasal drainage noted.  Pharynx erythematous with no exudate.  Ears normal.  Eyes normal. ?Eyes:  ?   Conjunctiva/sclera: Conjunctivae normal.  ?Cardiovascular:  ?   Rate and Rhythm: Normal rate and regular rhythm.  ?   Heart sounds: Normal heart sounds. No murmur heard. ?Pulmonary:  ?   Effort: Pulmonary effort is normal.  ?   Breath sounds: Normal breath sounds.  ?   Comments: Lungs clear to auscultation ?Musculoskeletal:     ?   General: Normal range of motion.  ?   Cervical back: Normal range of motion and neck supple.  ?Skin: ?   General: Skin is warm and dry.  ?Neurological:  ?   Mental Status: She is alert and oriented to person, place, and time.  ?Psychiatric:     ?  Mood and Affect: Mood normal.     ?   Behavior: Behavior normal.     ?   Thought Content: Thought content normal.     ?   Judgment: Judgment normal.  ? ? ?Diagnostics: ?Deferred due to frequent cough ? ?Assessment and Plan: ?1. Moderate persistent asthma without complication   ?2. Seasonal and perennial allergic rhinitis   ?3. Seasonal allergic conjunctivitis   ?4. Pruritus   ?5. Gastroesophageal reflux disease with esophagitis, unspecified whether hemorrhage   ? ? ?Meds ordered this encounter  ?Medications  ? montelukast (SINGULAIR) 10 MG tablet  ?  Sig: Take 1 tablet (10 mg total) by mouth at bedtime.  ?  Dispense:  30 tablet  ?  Refill:   5  ? ? ?Patient Instructions  ?Asthma  ?Begin montelukast 10 mg once a day to prevent cough or wheeze.Patient cautioned that rarely some children/adults can experience behavioral changes after beginning montelukast. These side effects are rare, however, if you notice any change, notify the clinic and discontinue montelukast. ?Continue Symbicort 80-2 puffs twice a day with a spacer to prevent cough or wheeze  ?Continue albuterol 2 puffs once every 4 hours as needed for cough or wheeze ?You may use albuterol 2 puffs 5 to 15 minutes before activity to decrease cough or  ?wheeze ? ?Allergic rhinitis ?Continue allergen avoidance measures directed toward tree pollen, dust mite, and mold as listed below ?Begin montelukast as listed above ?Begin Flonase 2 sprays in each nostril once a day for nasal congestion ?Consider saline nasal rinses as needed for nasal symptoms. Use this before any medicated nasal sprays for best result ?Consider updating your environmental allergy testing when it is convenient for you.  Remember to stop antihistamines for 3 days before your allergy testing appointment ? ?Allergic conjunctivitis ?Begin olopatadine 1 drop in each eye once a day as needed for red or itchy eyes ? ?Reflux ?Continue dietary and lifestyle modifications as listed below ? ?Atopic dermatitis/pruritus ?Continue a daily moisturizing routine ?Continue triamcinolone 0.1% cream to red itchy areas below your face twice a day as needed.  Do not use this medication longer than 3 weeks in a row ? ?Call the clinic if this treatment plan is not working well for you. ? ?Follow up in 2 months or sooner if needed. ? ? ?Return in about 2 months (around 01/03/2022), or if symptoms worsen or fail to improve. ?  ? ?Thank you for the opportunity to care for this patient.  Please do not hesitate to contact me with questions. ? ?Gareth Morgan, FNP ?[facility] ? ? ? ? ? ?

## 2021-11-03 NOTE — Patient Instructions (Signed)
Asthma  Begin montelukast 10 mg once a day to prevent cough or wheeze.Patient cautioned that rarely some children/adults can experience behavioral changes after beginning montelukast. These side effects are rare, however, if you notice any change, notify the clinic and discontinue montelukast. Continue Symbicort 80-2 puffs twice a day with a spacer to prevent cough or wheeze  Continue albuterol 2 puffs once every 4 hours as needed for cough or wheeze You may use albuterol 2 puffs 5 to 15 minutes before activity to decrease cough or  wheeze  Allergic rhinitis Continue allergen avoidance measures directed toward tree pollen, dust mite, and mold as listed below Begin montelukast as listed above Begin Flonase 2 sprays in each nostril once a day for nasal congestion Consider saline nasal rinses as needed for nasal symptoms. Use this before any medicated nasal sprays for best result Consider updating your environmental allergy testing when it is convenient for you.  Remember to stop antihistamines for 3 days before your allergy testing appointment  Allergic conjunctivitis Begin olopatadine 1 drop in each eye once a day as needed for red or itchy eyes  Reflux Continue dietary and lifestyle modifications as listed below  Atopic dermatitis/pruritus Continue a daily moisturizing routine Continue triamcinolone 0.1% cream to red itchy areas below your face twice a day as needed.  Do not use this medication longer than 3 weeks in a row  Call the clinic if this treatment plan is not working well for you.  Follow up in 2 months or sooner if needed.  Reducing Pollen Exposure The American Academy of All[employer], Ast[employer] and [employer] suggests the following steps to reduce your exposure to pollen during allergy seasons. Do not hang sheets or clothing out to dry; pollen may collect on these items. Do not mow lawns or spend time around freshly cut grass; mowing stirs up pollen. Keep windows closed at night.   Keep car windows closed while driving. Minimize morning activities outdoors, a time when pollen counts are usually at their highest. Stay indoors as much as possible when pollen counts or humidity is high and on windy days when pollen tends to remain in the air longer. Use air conditioning when possible.  Many air conditioners have filters that trap the pollen spores. Use a HEPA room air filter to remove pollen form the indoor air you breathe.  Control of Mold Allergen Mold and fungi can grow on a variety of surfaces provided certain temperature and moisture conditions exist.  Outdoor molds grow on plants, decaying vegetation and soil.  The major outdoor mold, Alternaria and Cladosporium, are found in very high numbers during hot and dry conditions.  Generally, a late Summer - Fall peak is seen for common outdoor fungal spores.  Rain will temporarily lower outdoor mold spore count, but counts rise rapidly when the rainy period ends.  The most important indoor molds are Aspergillus and Penicillium.  Dark, humid and poorly ventilated basements are ideal sites for mold growth.  The next most common sites of mold growth are the bathroom and the kitchen.  Outdoor Mold Control Use air conditioning and keep windows closed Avoid exposure to decaying vegetation. Avoid leaf raking. Avoid grain handling. Consider wearing a face mask if working in moldy areas.  Indoor Mold Control Maintain humidity below 50%. Clean washable surfaces with 5% bleach solution. Remove sources e.g. Contaminated carpets.   Control of Dust Mite Allergen Dust mites play a major role in allergic asthma and rhinitis. They occur in environments with high humidity   wherever human skin is found. Dust mites absorb humidity from the atmosphere (ie, they do not drink) and feed on organic matter (including shed human and animal skin). Dust mites are a microscopic type of insect that you cannot see with the naked eye. High levels of dust  mites have been detected from mattresses, pillows, carpets, upholstered furniture, bed covers, clothes, soft toys and any woven material. The principal allergen of the dust mite is found in its feces. A gram of dust may contain 1,000 mites and 250,000 fecal particles. Mite antigen is easily measured in the air during house cleaning activities. Dust mites do not bite and do not cause harm to humans, other than by triggering allergies/asthma.  Ways to decrease your exposure to dust mites in your home:  1. Encase mattresses, box springs and pillows with a mite-impermeable barrier or cover  2. Wash sheets, blankets and drapes weekly in hot water (130 F) with detergent and dry them in a dryer on the hot setting.  3. Have the room cleaned frequently with a vacuum cleaner and a damp dust-mop. For carpeting or rugs, vacuuming with a vacuum cleaner equipped with a high-efficiency particulate air (HEPA) filter. The dust mite allergic individual should not be in a room which is being cleaned and should wait 1 hour after cleaning before going into the room.  4. Do not sleep on upholstered furniture (eg, couches).  5. If possible removing carpeting, upholstered furniture and drapery from the home is ideal. Horizontal blinds should be eliminated in the rooms where the person spends the most time (bedroom, study, television room). Washable vinyl, roller-type shades are optimal.  6. Remove all non-washable stuffed toys from the bedroom. Wash stuffed toys weekly like sheets and blankets above.  7. Reduce indoor humidity to less than 50%. Inexpensive humidity monitors can be purchased at most hardware stores. Do not use a humidifier as can make the problem worse and are not recommended. 

## 2021-11-06 DIAGNOSIS — Z20822 Contact with and (suspected) exposure to covid-19: Secondary | ICD-10-CM | POA: Diagnosis not present

## 2021-11-17 DIAGNOSIS — Z20822 Contact with and (suspected) exposure to covid-19: Secondary | ICD-10-CM | POA: Diagnosis not present

## 2021-11-20 DIAGNOSIS — Z20822 Contact with and (suspected) exposure to covid-19: Secondary | ICD-10-CM | POA: Diagnosis not present

## 2021-11-20 DIAGNOSIS — G894 Chronic pain syndrome: Secondary | ICD-10-CM | POA: Diagnosis not present

## 2021-11-20 DIAGNOSIS — M5432 Sciatica, left side: Secondary | ICD-10-CM | POA: Diagnosis not present

## 2021-11-20 DIAGNOSIS — I10 Essential (primary) hypertension: Secondary | ICD-10-CM | POA: Diagnosis not present

## 2022-01-23 DIAGNOSIS — I10 Essential (primary) hypertension: Secondary | ICD-10-CM | POA: Diagnosis not present

## 2022-01-23 DIAGNOSIS — G894 Chronic pain syndrome: Secondary | ICD-10-CM | POA: Diagnosis not present

## 2022-01-23 DIAGNOSIS — Z6832 Body mass index (BMI) 32.0-32.9, adult: Secondary | ICD-10-CM | POA: Diagnosis not present

## 2022-01-23 DIAGNOSIS — M62830 Muscle spasm of back: Secondary | ICD-10-CM | POA: Diagnosis not present

## 2022-02-05 DIAGNOSIS — Z1331 Encounter for screening for depression: Secondary | ICD-10-CM | POA: Diagnosis not present

## 2022-02-05 DIAGNOSIS — M4804 Spinal stenosis, thoracic region: Secondary | ICD-10-CM | POA: Diagnosis not present

## 2022-02-05 DIAGNOSIS — I10 Essential (primary) hypertension: Secondary | ICD-10-CM | POA: Diagnosis not present

## 2022-02-05 DIAGNOSIS — M5442 Lumbago with sciatica, left side: Secondary | ICD-10-CM | POA: Diagnosis not present

## 2022-02-05 DIAGNOSIS — J45909 Unspecified asthma, uncomplicated: Secondary | ICD-10-CM | POA: Diagnosis not present

## 2022-02-05 DIAGNOSIS — G8929 Other chronic pain: Secondary | ICD-10-CM | POA: Diagnosis not present

## 2022-02-05 DIAGNOSIS — Z0000 Encounter for general adult medical examination without abnormal findings: Secondary | ICD-10-CM | POA: Diagnosis not present

## 2022-02-05 DIAGNOSIS — Z7189 Other specified counseling: Secondary | ICD-10-CM | POA: Diagnosis not present

## 2022-02-05 DIAGNOSIS — E1169 Type 2 diabetes mellitus with other specified complication: Secondary | ICD-10-CM | POA: Diagnosis not present

## 2022-02-06 IMAGING — MR MR SHOULDER*L* W/O CM
5 series · 36 of 40 positions shown · non-contrast
Comparison: None.

CLINICAL DATA: Chronic left shoulder pain. Limited range of motion.

EXAM:
MRI OF THE LEFT SHOULDER WITHOUT CONTRAST
TECHNIQUE: Multiplanar, multisequence MR imaging of the shoulder was performed.
No intravenous contrast was administered.

[Series 4: T2 fat-sat · oblique · 4.0mm · 0.55mm/px · 7 of 19 slices shown (1 of 3)]
[im 1/19]
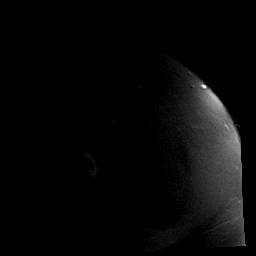
[im 4/19]
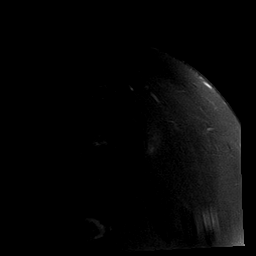
[im 7/19]
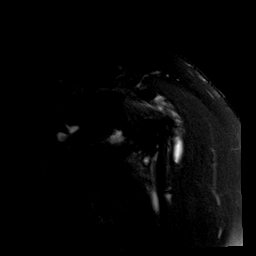
[im 10/19]
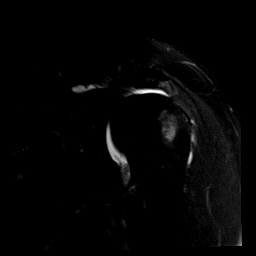
[im 13/19]
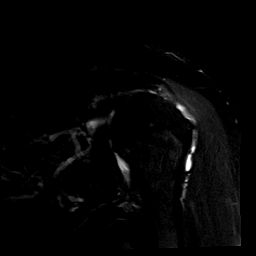
[im 16/19]
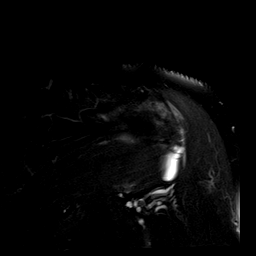
[im 19/19]
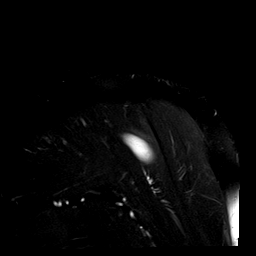

[Series 5: T2 fat-sat · axial · 4.0mm · 0.55mm/px · z∈[-88,+27]mm · 9 of 24 slices shown (2 of 3)]
[im 1/24]
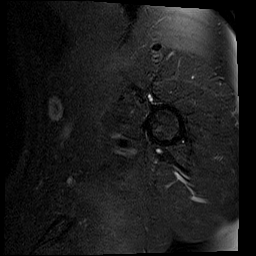
[im 3/24]
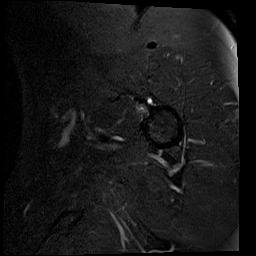
[im 6/24]
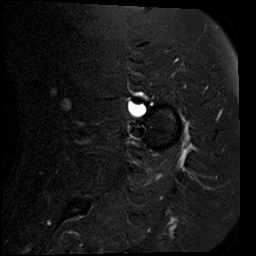
[im 9/24]
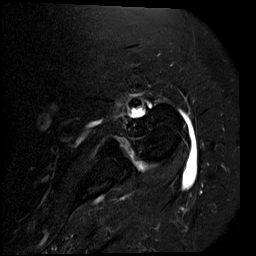
[im 12/24]
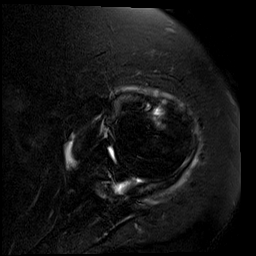
[im 15/24]
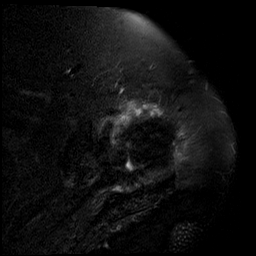
[im 18/24]
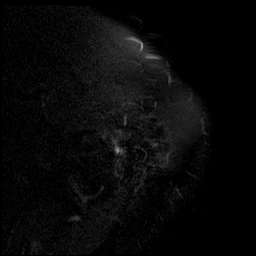
[im 21/24]
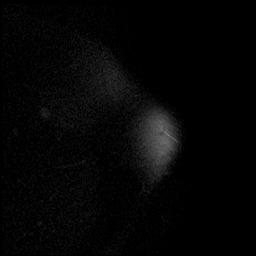
[im 24/24]
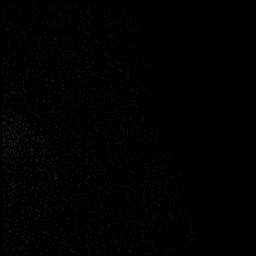

[Series 6: PD · oblique · 4.0mm · 0.27mm/px · 7 of 19 slices shown]
[im 1/19]
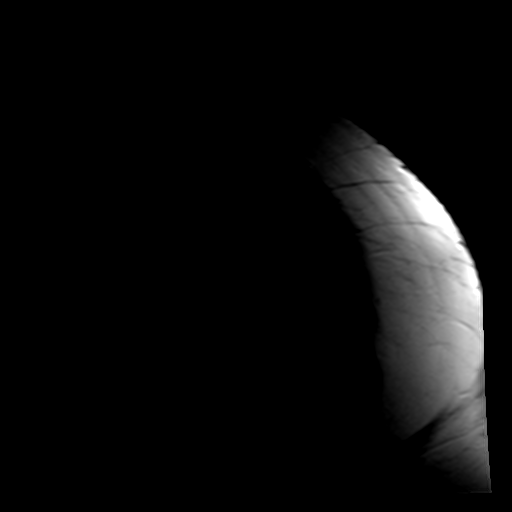
[im 4/19]
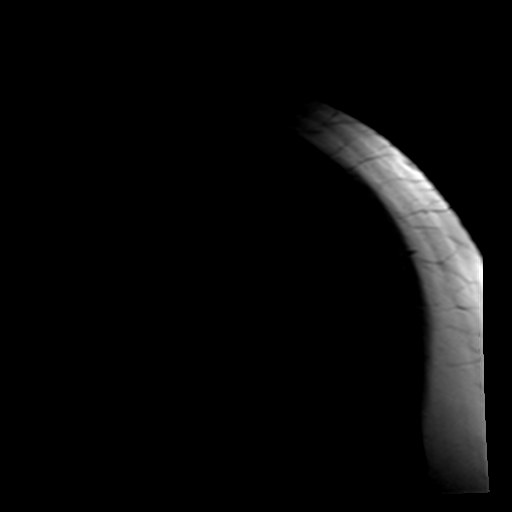
[im 7/19]
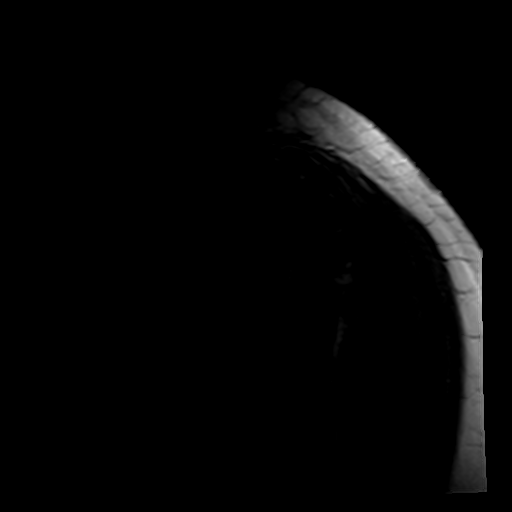
[im 10/19]
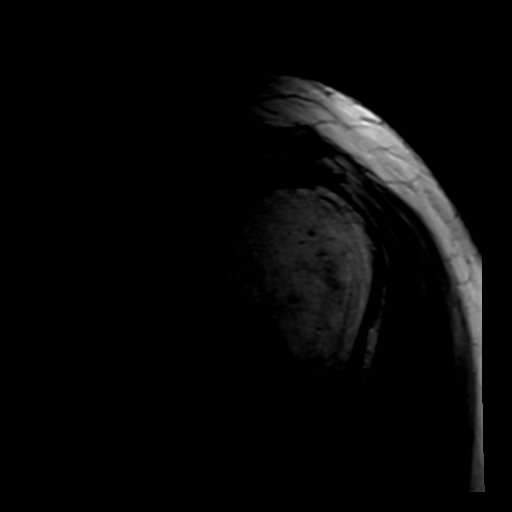
[im 13/19]
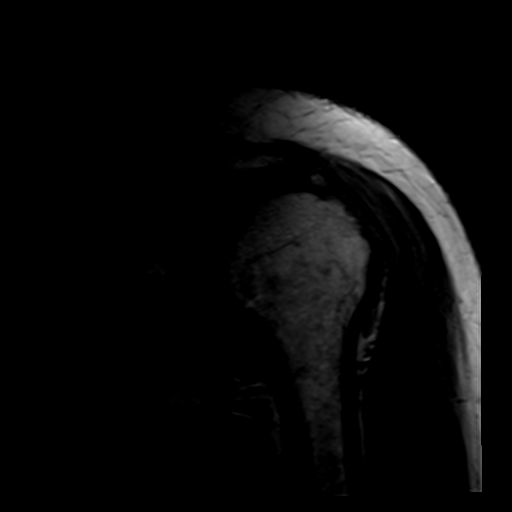
[im 16/19]
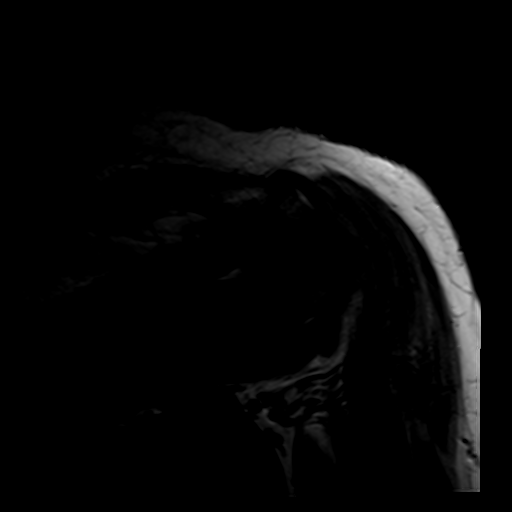
[im 19/19]
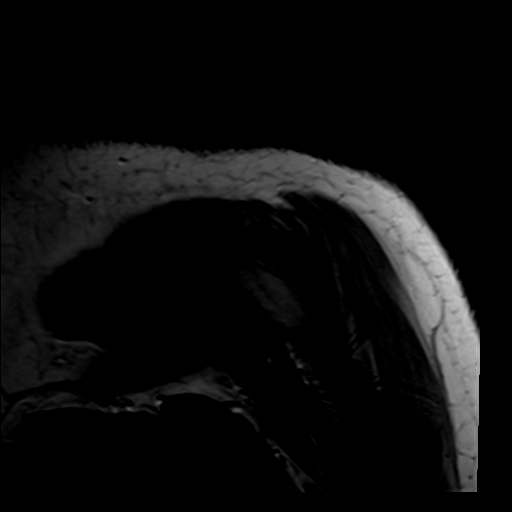

[Series 7: T2 fat-sat · oblique · 4.0mm · 0.55mm/px · 9 of 25 slices shown (3 of 3)]
[im 1/25]
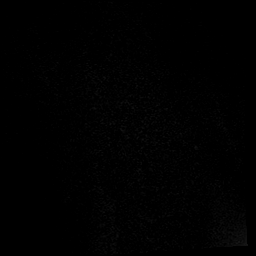
[im 4/25]
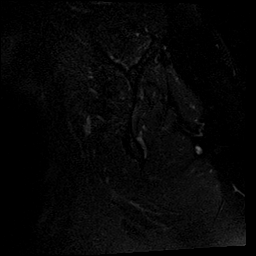
[im 7/25]
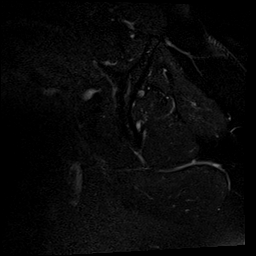
[im 10/25]
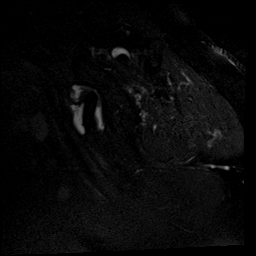
[im 13/25]
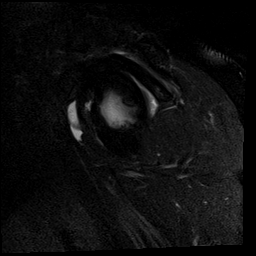
[im 16/25]
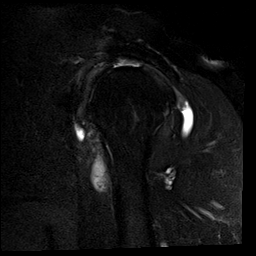
[im 19/25]
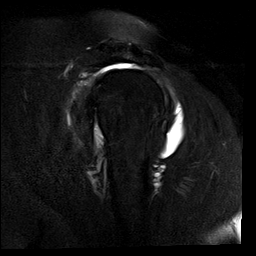
[im 22/25]
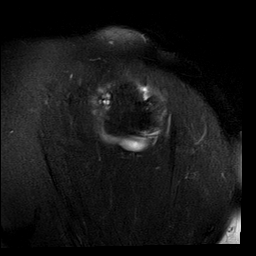
[im 25/25]
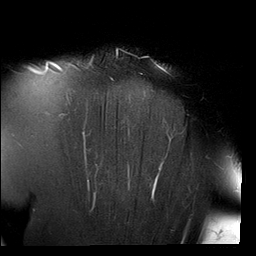

[Series 8: T1 · oblique · 4.0mm · 0.27mm/px · 4 of 23 slices shown]
[im 1/23]
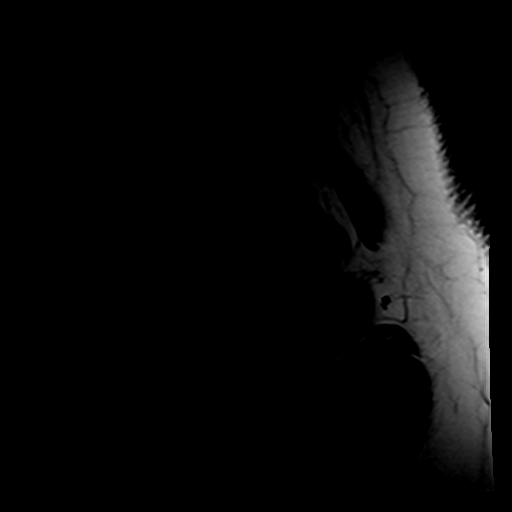
[im 4/23]
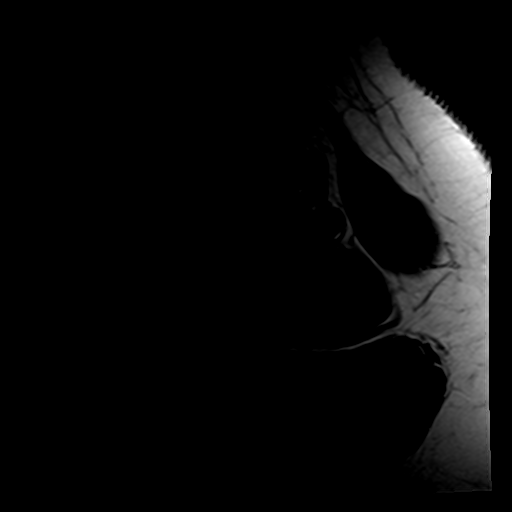
[im 7/23]
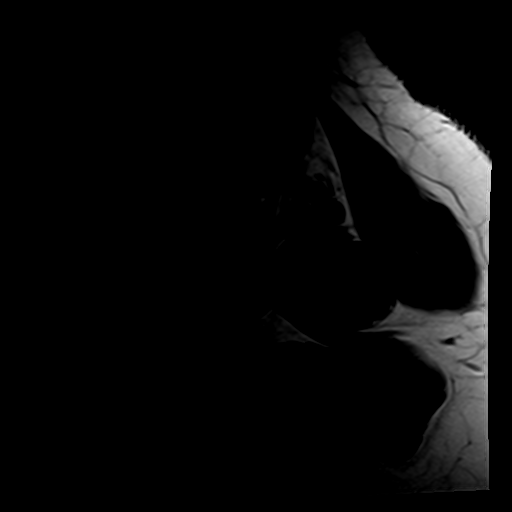
[im 10/23]
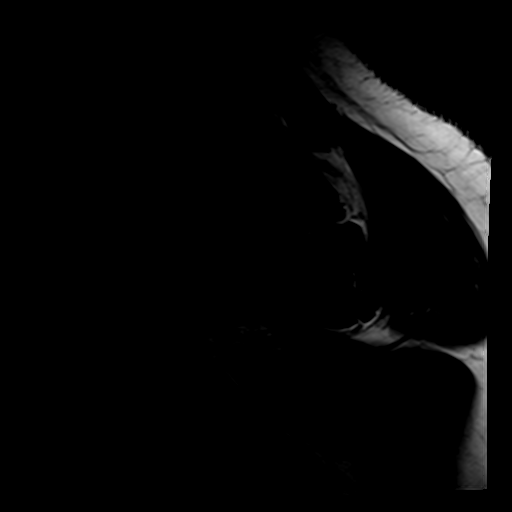

[36 of 40 positions shown; findings below may reference images not displayed]

FINDINGS: Rotator cuff: Complete tear of the supraspinatus and infraspinatus
tendons with 4 cm of retraction. Teres minor tendon is intact.
Moderate tendinosis of the subscapularis tendon with a
partial-thickness tear.

Muscles: No muscle atrophy or edema. No intramuscular fluid
collection or hematoma.

Biceps Long Head: Moderate tendinosis of the intra-articular portion
of the long head of the biceps tendon. Severe tendinosis of the
proximal extra-articular portion of the long head of the biceps
tendon.

Acromioclavicular Joint: Moderate arthropathy of the
acromioclavicular joint. Moderate amount of subacromial/subdeltoid
bursal fluid.

Glenohumeral Joint: Small joint effusion with synovitis in the
axillary recess. Partial-thickness cartilage loss of the
glenohumeral joint.

Labrum: Grossly intact, but evaluation is limited by lack of
intraarticular fluid/contrast.

Bones: No fracture or dislocation. No aggressive osseous lesion.
Subcortical reactive marrow changes at the supraspinatus insertion.

Other: No fluid collection or hematoma.
IMPRESSION: 1. Complete tear of the supraspinatus and infraspinatus tendons with
4 cm of retraction.
2. Moderate tendinosis of the subscapularis tendon with a
partial-thickness tear.
3. Moderate tendinosis of the intra-articular portion of the long
head of the biceps tendon. Severe tendinosis of the proximal
extra-articular portion of the long head of the biceps tendon.

## 2022-02-12 ENCOUNTER — Ambulatory Visit (HOSPITAL_COMMUNITY)
Admission: EM | Admit: 2022-02-12 | Discharge: 2022-02-12 | Disposition: A | Discharge status: Home / Self Care | Payer: Medicare Other | Attending: Physician Assistant | Admitting: Physician Assistant

## 2022-02-12 ENCOUNTER — Encounter (HOSPITAL_COMMUNITY): Payer: Self-pay

## 2022-02-12 DIAGNOSIS — K2970 Gastritis, unspecified, without bleeding: Secondary | ICD-10-CM

## 2022-02-12 DIAGNOSIS — K2990 Gastroduodenitis, unspecified, without bleeding: Secondary | ICD-10-CM

## 2022-02-12 DIAGNOSIS — R1013 Epigastric pain: Secondary | ICD-10-CM

## 2022-02-12 MED ORDER — ONDANSETRON HCL 4 MG PO TABS: 4.0000 mg | ORAL_TABLET | Freq: Three times a day (TID) | ORAL | 0 refills | Status: DC | PRN

## 2022-02-12 MED ORDER — FAMOTIDINE 20 MG PO TABS: 20.0000 mg | ORAL_TABLET | Freq: Two times a day (BID) | ORAL | 0 refills | Status: DC

## 2022-02-12 NOTE — ED Triage Notes (Signed)
Onset 2-3 days of gas and ingestion. Pt feels like something is crawling inside her stomach.

## 2022-02-12 NOTE — Discharge Instructions (Addendum)
Advised to take Pepcid 20 mg twice daily to help reduce the stomach move movements and the increased stomach irritability. Advised take the Zofran 1 tablet every 6 hours to help decrease the stomach irritability and rumbling. Advised to follow-up with PCP or return to urgent care if symptoms fail to improve.

## 2022-02-12 NOTE — ED Provider Notes (Signed)
[facility]    CSN: [id number] Arrival date & time: 02/12/22  0848      History   Chief Complaint No chief complaint on file.   HPI Rachel Crane is a 76 y.o. female.   76 year old female presents with increased stomach gas and movement.  Patient indicates for the past 3 days she has been having increased episodes of feeling like her stomach is moving with increased gas production and belching.  Patient relates that she has not had any nausea or vomiting, and no loose bowels or diarrhea.  Patient indicates she has not had any fever or chills.  She indicates that she has been eating some rice and peas along with hot tea to try to decrease her symptoms and this tends to help and she feels better when she belches.  Patient indicates she has not been around anyone has been sick.  Patient denies having reflux or traditional indigestion type symptoms.  He does indicates she has a lot of stomach rumbling, rolling, and cramping with belching.     Past Medical History:  Diagnosis Date   Abnormal stress ECG 10/06/2018   Aortic valve sclerosis 10/06/2018   Arthritis    Asthma    Bilateral carotid bruits 10/06/2018   Headache    H/O bad HA, since using CPAP- she has had improvement with that issue    Hypertension    Laboratory examination 10/06/2018   OSA on CPAP    settting - 8, uses CPAP q night    Pre-diabetes    Shingles     Patient Active Problem List   Diagnosis Date Noted   Moderate persistent asthma without complication 83/15/1761   Seasonal and perennial allergic rhinitis 11/03/2021   Gastroesophageal reflux disease with esophagitis 11/03/2021   Status post total left knee replacement 06/06/2020   Positive ANA (antinuclear antibody) 04/25/2020   Primary osteoarthritis of left knee 04/14/2020   Lumbar radiculopathy 06/26/2019   Left-sided low back pain with left-sided sciatica 06/26/2019   Thoracic spinal stenosis 04/18/2019   Seasonal allergies 12/04/2018    Seasonal allergic conjunctivitis 10/10/2018   Moderate persistent asthma with acute exacerbation 10/10/2018   Pruritus 10/10/2018   Aortic valve sclerosis 10/06/2018   Bilateral carotid bruits 10/06/2018   Abnormal stress ECG 10/06/2018   Laboratory examination 10/06/2018   Palpitations 10/06/2018   Total knee replacement status 09/19/2017   Sprain of anterior talofibular ligament of right ankle 12/13/2016    Past Surgical History:  Procedure Laterality Date   CHOLECYSTECTOMY  1998   TOTAL KNEE ARTHROPLASTY Right 09/19/2017   Procedure: RIGHT TOTAL KNEE ARTHROPLASTY;  Surgeon: Leandrew Koyanagi, MD;  Location: [place];  Service: Orthopedics;  Laterality: Right;   TOTAL KNEE ARTHROPLASTY Left 06/06/2020   Procedure: LEFT TOTAL KNEE ARTHROPLASTY;  Surgeon: Leandrew Koyanagi, MD;  Location: [facility];  Service: Orthopedics;  Laterality: Left;   TUBAL LIGATION      OB History   No obstetric history on file.      Home Medications    Prior to Admission medications   Medication Sig Start Date End Date Taking? Authorizing Provider  famotidine (PEPCID) 20 MG tablet Take 1 tablet (20 mg total) by mouth 2 (two) times daily. 02/12/22  Yes Nyoka Lint, PA-C  ondansetron (ZOFRAN) 4 MG tablet Take 1 tablet (4 mg total) by mouth every 8 (eight) hours as needed for nausea or vomiting. Take to help calm the stomach. 02/12/22  Yes Nyoka Lint, PA-C  albuterol (  VENTOLIN HFA) 108 (90 Base) MCG/ACT inhaler Inhale 1-2 puffs into the lungs every 4 (four) hours as needed for wheezing or shortness of breath. 05/24/21   Kennith Gain, MD  azelastine (ASTELIN) 0.1 % nasal spray Place 2 sprays into both nostrils 2 (two) times daily as needed (Nasal drainage). Use in each nostril as directed Patient not taking: Reported on 11/03/2021 03/31/21   Kennith Gain, MD  benzonatate (TESSALON) 200 MG capsule Take 1 capsule (200 mg total) by mouth 3 (three) times daily as needed for cough. 10/27/21    Rodriguez-Southworth, Sunday Spillers, PA-C  budesonide-formoterol (SYMBICORT) 80-4.5 MCG/ACT inhaler 2 Puffs 2 times daily 08/02/20   Kennith Gain, MD  chlorthalidone (HYGROTON) 25 MG tablet Take 25 mg by mouth daily.  11/03/19   [provider]  cholecalciferol (VITAMIN D) 25 MCG (1000 UNIT) tablet Take 1,000 Units by mouth daily.    [provider]  guaiFENesin-codeine 100-10 MG/5ML syrup Take 5 mLs by mouth at bedtime as needed for cough. 10/27/21   Rodriguez-Southworth, Sunday Spillers, PA-C  JARDIANCE 10 MG TABS tablet Take 10 mg by mouth daily. 06/28/21   [provider]  latanoprost (XALATAN) 0.005 % ophthalmic solution  02/05/21   [provider]  loratadine (CLARITIN) 10 MG tablet TAKE 1 TABLET(10 MG) BY MOUTH DAILY 05/24/21   Kennith Gain, MD  losartan (COZAAR) 100 MG tablet Take 1 tablet (100 mg total) by mouth daily. 12/04/18   [name], NP  montelukast (SINGULAIR) 10 MG tablet Take 1 tablet (10 mg total) by mouth at bedtime. 11/03/21   Dara Hoyer, FNP  Potassium Chloride ER 20 MEQ TBCR Take 1 tablet by mouth daily. 08/09/20   [provider]  pravastatin (PRAVACHOL) 40 MG tablet Take 40 mg by mouth daily.  03/31/19   [provider]  predniSONE (DELTASONE) 20 MG tablet Take 1 tablet (20 mg total) by mouth daily with breakfast. 10/27/21   Rodriguez-Southworth, Sunday Spillers, PA-C  triamcinolone ointment (KENALOG) 0.1 % Apply 1 application topically 2 (two) times daily as needed. 06/30/21   Kennith Gain, MD    Family History Family History  Problem Relation Age of Onset   Breast cancer Sister    Hypertension Mother    Arthritis Mother     Social History Social History   Tobacco Use   Smoking status: Never   Smokeless tobacco: Never  Vaping Use   Vaping Use: Never used  Substance Use Topics   Alcohol use: Not Currently   Drug use: No     Allergies   Hydrocodone-acetaminophen   Review of  Systems Review of Systems  Gastrointestinal:  Positive for abdominal pain (cramping).     Physical Exam Triage Vital Signs ED Triage Vitals [02/12/22 0912]  Enc Vitals Group     BP 113/70     Pulse Rate 66     Resp 16     Temp (!) 97.4 F (36.3 C)     Temp Source Oral     SpO2 95 %     Weight      Height      Head Circumference      Peak Flow      Pain Score      Pain Loc      Pain Edu?      Excl. in [place]?    No data found.  Updated Vital Signs BP 113/70 (BP Location: Left Arm)   Pulse 66   Temp Marland Kitchen)  97.4 F (36.3 C) (Oral)   Resp 16   SpO2 95%   Visual Acuity Right Eye Distance:   Left Eye Distance:   Bilateral Distance:    Right Eye Near:   Left Eye Near:    Bilateral Near:     Physical Exam Constitutional:      Appearance: Normal appearance.  Cardiovascular:     Rate and Rhythm: Normal rate and regular rhythm.     Heart sounds: Normal heart sounds.  Pulmonary:     Effort: Pulmonary effort is normal.     Breath sounds: Normal breath sounds and air entry.  Abdominal:     General: Abdomen is flat. Bowel sounds are increased.     Palpations: Abdomen is soft.     Tenderness: There is no abdominal tenderness. There is no guarding or rebound.  Neurological:     Mental Status: She is alert.      UC Treatments / Results  Labs (all labs ordered are listed, but only abnormal results are displayed) Labs Reviewed - No data to display  EKG   Radiology No results found.  Procedures Procedures (including critical care time)  Medications Ordered in UC Medications - No data to display  Initial Impression / Assessment and Plan / UC Course  I have reviewed the triage vital signs and the nursing notes.  Pertinent labs & imaging results that were available during my care of the patient were reviewed by me and considered in my medical decision making (see chart for details).    Plan: 1.  Advised take Zofran 1 every 6-8 hours as needed for stomach  cramping. 2.  Advised take Pepcid 20 mg twice daily to help decrease the belching and stomach irritation. 3.  Advised to follow-up with PCP or return to urgent care if symptoms fail to improve. Final Clinical Impressions(s) / UC Diagnoses   Final diagnoses:  Gastritis and gastroduodenitis  Dyspepsia     Discharge Instructions      Advised to take Pepcid 20 mg twice daily to help reduce the stomach move movements and the increased stomach irritability. Advised take the Zofran 1 tablet every 6 hours to help decrease the stomach irritability and rumbling. Advised to follow-up with PCP or return to urgent care if symptoms fail to improve.    ED Prescriptions     Medication Sig Dispense Auth. Provider   ondansetron (ZOFRAN) 4 MG tablet Take 1 tablet (4 mg total) by mouth every 8 (eight) hours as needed for nausea or vomiting. Take to help calm the stomach. 20 tablet Nyoka Lint, PA-C   famotidine (PEPCID) 20 MG tablet Take 1 tablet (20 mg total) by mouth 2 (two) times daily. 30 tablet Nyoka Lint, PA-C      PDMP not reviewed this encounter.   Nyoka Lint, PA-C 02/12/22 1008

## 2022-02-15 ENCOUNTER — Ambulatory Visit (INDEPENDENT_AMBULATORY_CARE_PROVIDER_SITE_OTHER): Payer: Medicare Other | Admitting: Orthopaedic Surgery

## 2022-02-15 ENCOUNTER — Ambulatory Visit (INDEPENDENT_AMBULATORY_CARE_PROVIDER_SITE_OTHER): Payer: Medicare Other

## 2022-02-15 DIAGNOSIS — Z96652 Presence of left artificial knee joint: Secondary | ICD-10-CM

## 2022-02-15 DIAGNOSIS — Z96651 Presence of right artificial knee joint: Secondary | ICD-10-CM

## 2022-02-15 DIAGNOSIS — G8929 Other chronic pain: Secondary | ICD-10-CM

## 2022-02-15 DIAGNOSIS — M545 Low back pain, unspecified: Secondary | ICD-10-CM | POA: Diagnosis not present

## 2022-02-15 NOTE — Progress Notes (Signed)
Office Visit Note   Patient: Rachel Crane           Date of Birth: April 30, 1946           MRN: [id number] Visit Date: 02/15/2022              Requested by: Leeroy Cha, MD [address] [place],  [place] [id number] PCP: Leeroy Cha, MD   Assessment & Plan: Visit Diagnoses:  1. Status post total left knee replacement   2. Status post total right knee replacement   3. Chronic low back pain, unspecified back pain laterality, unspecified whether sciatica present     Plan: Impression is chronic low back pain and left knee pain status post total knee replacement.  The knee replacement looks good on x-ray.  At this point we will get inflammatory markers and bone scan to look for infection and/or loosening.  In terms of her lumbar spine we will need a new MRI and she will just follow-up with Dr. Lynann Bologna directly after the MRI.  She will follow-up with me regarding her knee pain after the bone scan.  Follow-Up Instructions: No follow-ups on file.   Orders:  Orders Placed This Encounter  Procedures   XR KNEE 3 VIEW LEFT   MR Lumbar Spine w/o contrast   NM Bone Scan 3 Phase Lower Extremity   C-reactive protein   Sed Rate (ESR)   CBC with Differential   No orders of the defined types were placed in this encounter.     Procedures: No procedures performed   Clinical Data: No additional findings.   Subjective: Chief Complaint  Patient presents with   Left Knee - Pain    HPI Patient returns today for evaluation of chronic left knee pain and chronic low back pain.  Continues to have left knee pain off and on that is worse with activity.  Denies any constitutional symptoms. Review of Systems  Constitutional: Negative.   HENT: Negative.    Eyes: Negative.   Respiratory: Negative.    Cardiovascular: Negative.   Endocrine: Negative.   Musculoskeletal: Negative.   Neurological: Negative.   Hematological: Negative.   Psychiatric/Behavioral:  Negative.    All other systems reviewed and are negative.    Objective: Vital Signs: There were no vitals taken for this visit.  Physical Exam Vitals and nursing note reviewed.  Constitutional:      Appearance: She is well-developed.  HENT:     Head: Atraumatic.     Nose: Nose normal.  Eyes:     Extraocular Movements: Extraocular movements intact.  Cardiovascular:     Pulses: Normal pulses.  Pulmonary:     Effort: Pulmonary effort is normal.  Abdominal:     Palpations: Abdomen is soft.  Musculoskeletal:     Cervical back: Neck supple.  Skin:    General: Skin is warm.     Capillary Refill: Capillary refill takes less than 2 seconds.  Neurological:     Mental Status: She is alert. Mental status is at baseline.  Psychiatric:        Behavior: Behavior normal.        Thought Content: Thought content normal.        Judgment: Judgment normal.     Ortho Exam Examination of the lumbar spine and left knee are unchanged. Specialty Comments:  No specialty comments available.  Imaging: XR KNEE 3 VIEW LEFT  Result Date: 02/15/2022 Stable total knee replacement without complication  PMFS History: Patient Active Problem List   Diagnosis Date Noted   Status post total right knee replacement 02/15/2022   Chronic low back pain 02/15/2022   Moderate persistent asthma without complication 74/16/3845   Seasonal and perennial allergic rhinitis 11/03/2021   Gastroesophageal reflux disease with esophagitis 11/03/2021   Status post total left knee replacement 06/06/2020   Positive ANA (antinuclear antibody) 04/25/2020   Primary osteoarthritis of left knee 04/14/2020   Lumbar radiculopathy 06/26/2019   Left-sided low back pain with left-sided sciatica 06/26/2019   Thoracic spinal stenosis 04/18/2019   Seasonal allergies 12/04/2018   Seasonal allergic conjunctivitis 10/10/2018   Moderate persistent asthma with acute exacerbation 10/10/2018   Pruritus 10/10/2018   Aortic valve  sclerosis 10/06/2018   Bilateral carotid bruits 10/06/2018   Abnormal stress ECG 10/06/2018   Laboratory examination 10/06/2018   Palpitations 10/06/2018   Total knee replacement status 09/19/2017   Sprain of anterior talofibular ligament of right ankle 12/13/2016   Past Medical History:  Diagnosis Date   Abnormal stress ECG 10/06/2018   Aortic valve sclerosis 10/06/2018   Arthritis    Asthma    Bilateral carotid bruits 10/06/2018   Headache    H/O bad HA, since using CPAP- she has had improvement with that issue    Hypertension    Laboratory examination 10/06/2018   OSA on CPAP    settting - 8, uses CPAP q night    Pre-diabetes    Shingles     Family History  Problem Relation Age of Onset   Breast cancer Sister    Hypertension Mother    Arthritis Mother     Past Surgical History:  Procedure Laterality Date   CHOLECYSTECTOMY  1998   TOTAL KNEE ARTHROPLASTY Right 09/19/2017   Procedure: RIGHT TOTAL KNEE ARTHROPLASTY;  Surgeon: Leandrew Koyanagi, MD;  Location: [facility];  Service: Orthopedics;  Laterality: Right;   TOTAL KNEE ARTHROPLASTY Left 06/06/2020   Procedure: LEFT TOTAL KNEE ARTHROPLASTY;  Surgeon: Leandrew Koyanagi, MD;  Location: [facility];  Service: Orthopedics;  Laterality: Left;   TUBAL LIGATION     Social History   Occupational History   Not on file  Tobacco Use   Smoking status: Never   Smokeless tobacco: Never  Vaping Use   Vaping Use: Never used  Substance and Sexual Activity   Alcohol use: Not Currently   Drug use: No   Sexual activity: Not on file

## 2022-02-16 DIAGNOSIS — H401131 Primary open-angle glaucoma, bilateral, mild stage: Secondary | ICD-10-CM | POA: Diagnosis not present

## 2022-02-16 DIAGNOSIS — H25813 Combined forms of age-related cataract, bilateral: Secondary | ICD-10-CM | POA: Diagnosis not present

## 2022-02-16 DIAGNOSIS — E119 Type 2 diabetes mellitus without complications: Secondary | ICD-10-CM | POA: Diagnosis not present

## 2022-02-16 LAB — CBC WITH DIFFERENTIAL/PLATELET
Absolute Monocytes: 422 cells/uL (ref 200–950)
Basophils Absolute: 32 cells/uL (ref 0–200)
Basophils Relative: 0.5 %
Eosinophils Absolute: 113 cells/uL (ref 15–500)
Eosinophils Relative: 1.8 %
HCT: 36.5 % (ref 35.0–45.0)
Hemoglobin: 11.7 g/dL (ref 11.7–15.5)
Lymphs Abs: 2356 cells/uL (ref 850–3900)
MCH: 27.2 pg (ref 27.0–33.0)
MCHC: 32.1 g/dL (ref 32.0–36.0)
MCV: 84.9 fL (ref 80.0–100.0)
MPV: 8.6 fL (ref 7.5–12.5)
Monocytes Relative: 6.7 %
Neutro Abs: 3377 cells/uL (ref 1500–7800)
Neutrophils Relative %: 53.6 %
Platelets: 222 10*3/uL (ref 140–400)
RBC: 4.3 10*6/uL (ref 3.80–5.10)
RDW: 13.7 % (ref 11.0–15.0)
Total Lymphocyte: 37.4 %
WBC: 6.3 10*3/uL (ref 3.8–10.8)

## 2022-02-16 LAB — C-REACTIVE PROTEIN: CRP: 4 mg/L (ref <8.0)

## 2022-02-16 LAB — SEDIMENTATION RATE: Sed Rate: 28 mm/h (ref 0–30)

## 2022-02-23 ENCOUNTER — Ambulatory Visit
Admission: RE | Admit: 2022-02-23 | Discharge: 2022-02-23 | Disposition: A | Discharge status: Home / Self Care | Payer: Medicare Other | Source: Ambulatory Visit | Attending: Orthopaedic Surgery | Admitting: Orthopaedic Surgery

## 2022-02-23 DIAGNOSIS — M545 Low back pain, unspecified: Secondary | ICD-10-CM | POA: Diagnosis not present

## 2022-02-23 DIAGNOSIS — M47816 Spondylosis without myelopathy or radiculopathy, lumbar region: Secondary | ICD-10-CM | POA: Diagnosis not present

## 2022-02-23 DIAGNOSIS — M48061 Spinal stenosis, lumbar region without neurogenic claudication: Secondary | ICD-10-CM | POA: Diagnosis not present

## 2022-02-23 NOTE — Progress Notes (Signed)
Please refer patient to dumonski if she hasn't already made an appt to see him.  Thanks.

## 2022-02-26 ENCOUNTER — Other Ambulatory Visit: Payer: Self-pay

## 2022-02-26 DIAGNOSIS — M545 Low back pain, unspecified: Secondary | ICD-10-CM

## 2022-03-02 ENCOUNTER — Encounter (HOSPITAL_COMMUNITY)
Admission: RE | Admit: 2022-03-02 | Discharge: 2022-03-02 | Disposition: A | Discharge status: Home / Self Care | Payer: Medicare Other | Source: Ambulatory Visit | Attending: Orthopaedic Surgery | Admitting: Orthopaedic Surgery

## 2022-03-02 DIAGNOSIS — Z96652 Presence of left artificial knee joint: Secondary | ICD-10-CM | POA: Insufficient documentation

## 2022-03-02 DIAGNOSIS — M25562 Pain in left knee: Secondary | ICD-10-CM | POA: Diagnosis not present

## 2022-03-02 DIAGNOSIS — M25561 Pain in right knee: Secondary | ICD-10-CM | POA: Diagnosis not present

## 2022-03-02 DIAGNOSIS — Z96651 Presence of right artificial knee joint: Secondary | ICD-10-CM | POA: Diagnosis not present

## 2022-03-02 DIAGNOSIS — Z96653 Presence of artificial knee joint, bilateral: Secondary | ICD-10-CM | POA: Diagnosis not present

## 2022-03-02 MED ORDER — TECHNETIUM TC 99M MEDRONATE IV KIT
20.0000 | PACK | Freq: Once | INTRAVENOUS | Status: AC | PRN
  Administered 2022-03-02: 18.7 via INTRAVENOUS

## 2022-03-05 NOTE — Progress Notes (Signed)
Needs appt for bone scan.  Thanks.

## 2022-03-06 ENCOUNTER — Ambulatory Visit (INDEPENDENT_AMBULATORY_CARE_PROVIDER_SITE_OTHER): Payer: Medicare Other | Admitting: Orthopaedic Surgery

## 2022-03-06 ENCOUNTER — Telehealth: Payer: Self-pay | Admitting: Orthopaedic Surgery

## 2022-03-06 DIAGNOSIS — Z96651 Presence of right artificial knee joint: Secondary | ICD-10-CM | POA: Diagnosis not present

## 2022-03-06 DIAGNOSIS — M545 Low back pain, unspecified: Secondary | ICD-10-CM | POA: Diagnosis not present

## 2022-03-06 DIAGNOSIS — Z96652 Presence of left artificial knee joint: Secondary | ICD-10-CM

## 2022-03-06 DIAGNOSIS — G8929 Other chronic pain: Secondary | ICD-10-CM | POA: Diagnosis not present

## 2022-03-06 NOTE — Telephone Encounter (Signed)
Patient came in today stating her foot is hurting so bad she cannot wait until next Tuesday to be seen she would like to come in as soon as possible, if not today tomorrow I let her know I had no appts available until possibly next week please advise

## 2022-03-06 NOTE — Progress Notes (Signed)
Office Visit Note   Patient: Rachel Crane           Date of Birth: 1945-08-07           MRN: [id number] Visit Date: 03/06/2022              Requested by: Leeroy Cha, MD [address] [place],  [place] [id number] PCP: Leeroy Cha, MD   Assessment & Plan: Visit Diagnoses:  1. Chronic low back pain, unspecified back pain laterality, unspecified whether sciatica present   2. Status post total left knee replacement   3. Status post total right knee replacement     Plan: MRI of the spine shows diffuse degenerative changes and stenosis.  Patient requests referral back to Dr. Lynann Bologna for surgical evaluation.  In regards to the bone scan did not feel that the findings are consistent with her reported pain.  I feel that there may be micromotion but there is no gross loosening.  Upon further discussion I feel that her symptoms are more consistent with her lumbar pathology.  She can follow-up with [place] as needed.  Follow-Up Instructions: No follow-ups on file.   Orders:  No orders of the defined types were placed in this encounter.  No orders of the defined types were placed in this encounter.     Procedures: No procedures performed   Clinical Data: No additional findings.   Subjective: Chief Complaint  Patient presents with   Left Leg - Pain    HPI Rachel Crane returns today to discuss MRI lumbar spine and bone scan.  Review of Systems  Constitutional: Negative.   HENT: Negative.    Eyes: Negative.   Respiratory: Negative.    Cardiovascular: Negative.   Endocrine: Negative.   Musculoskeletal: Negative.   Neurological: Negative.   Hematological: Negative.   Psychiatric/Behavioral: Negative.    All other systems reviewed and are negative.    Objective: Vital Signs: There were no vitals taken for this visit.  Physical Exam Vitals and nursing note reviewed.  Constitutional:      Appearance: She is well-developed.  HENT:     Head:  Atraumatic.     Nose: Nose normal.  Eyes:     Extraocular Movements: Extraocular movements intact.  Cardiovascular:     Pulses: Normal pulses.  Pulmonary:     Effort: Pulmonary effort is normal.  Abdominal:     Palpations: Abdomen is soft.  Musculoskeletal:     Cervical back: Neck supple.  Skin:    General: Skin is warm.     Capillary Refill: Capillary refill takes less than 2 seconds.  Neurological:     Mental Status: She is alert. Mental status is at baseline.  Psychiatric:        Behavior: Behavior normal.        Thought Content: Thought content normal.        Judgment: Judgment normal.     Ortho Exam Examination is unchanged. Specialty Comments:  No specialty comments available.  Imaging: No results found.   PMFS History: Patient Active Problem List   Diagnosis Date Noted   Status post total right knee replacement 02/15/2022   Chronic low back pain 02/15/2022   Moderate persistent asthma without complication 38/46/6599   Seasonal and perennial allergic rhinitis 11/03/2021   Gastroesophageal reflux disease with esophagitis 11/03/2021   Status post total left knee replacement 06/06/2020   Positive ANA (antinuclear antibody) 04/25/2020   Primary osteoarthritis of left knee 04/14/2020   Lumbar  radiculopathy 06/26/2019   Left-sided low back pain with left-sided sciatica 06/26/2019   Thoracic spinal stenosis 04/18/2019   Seasonal allergies 12/04/2018   Seasonal allergic conjunctivitis 10/10/2018   Moderate persistent asthma with acute exacerbation 10/10/2018   Pruritus 10/10/2018   Aortic valve sclerosis 10/06/2018   Bilateral carotid bruits 10/06/2018   Abnormal stress ECG 10/06/2018   Laboratory examination 10/06/2018   Palpitations 10/06/2018   Total knee replacement status 09/19/2017   Sprain of anterior talofibular ligament of right ankle 12/13/2016   Past Medical History:  Diagnosis Date   Abnormal stress ECG 10/06/2018   Aortic valve sclerosis  10/06/2018   Arthritis    Asthma    Bilateral carotid bruits 10/06/2018   Headache    H/O bad HA, since using CPAP- she has had improvement with that issue    Hypertension    Laboratory examination 10/06/2018   OSA on CPAP    settting - 8, uses CPAP q night    Pre-diabetes    Shingles     Family History  Problem Relation Age of Onset   Breast cancer Sister    Hypertension Mother    Arthritis Mother     Past Surgical History:  Procedure Laterality Date   CHOLECYSTECTOMY  1998   TOTAL KNEE ARTHROPLASTY Right 09/19/2017   Procedure: RIGHT TOTAL KNEE ARTHROPLASTY;  Surgeon: Leandrew Koyanagi, MD;  Location: [facility];  Service: Orthopedics;  Laterality: Right;   TOTAL KNEE ARTHROPLASTY Left 06/06/2020   Procedure: LEFT TOTAL KNEE ARTHROPLASTY;  Surgeon: Leandrew Koyanagi, MD;  Location: [facility];  Service: Orthopedics;  Laterality: Left;   TUBAL LIGATION     Social History   Occupational History   Not on file  Tobacco Use   Smoking status: Never   Smokeless tobacco: Never  Vaping Use   Vaping Use: Never used  Substance and Sexual Activity   Alcohol use: Not Currently   Drug use: No   Sexual activity: Not on file

## 2022-03-13 ENCOUNTER — Ambulatory Visit: Payer: Medicare Other | Admitting: Orthopaedic Surgery

## 2022-03-30 DIAGNOSIS — M5416 Radiculopathy, lumbar region: Secondary | ICD-10-CM | POA: Diagnosis not present

## 2022-04-25 ENCOUNTER — Other Ambulatory Visit: Payer: Self-pay | Admitting: Internal Medicine

## 2022-04-25 DIAGNOSIS — Z1231 Encounter for screening mammogram for malignant neoplasm of breast: Secondary | ICD-10-CM

## 2022-05-03 ENCOUNTER — Other Ambulatory Visit: Payer: Self-pay | Admitting: Adult Health

## 2022-05-08 ENCOUNTER — Encounter: Payer: Self-pay | Admitting: *Deleted

## 2022-05-11 ENCOUNTER — Other Ambulatory Visit: Payer: Self-pay | Admitting: Allergy

## 2022-05-15 NOTE — Progress Notes (Unsigned)
Follow Up Note  RE: Rachel Crane MRN: [id number] DOB: [birth year] Date of Office Visit: 05/16/2022  Referring provider: Leeroy Cha,* Primary care provider: Leeroy Cha, MD  Chief Complaint: No chief complaint on file.  History of Present Illness: I had the pleasure of seeing Rachel Crane for a follow up visit at the [facility] on 05/15/2022. She is a 76 y.o. female, who is being followed for asthma, allergic rhinoconjunctivitis, reflux, dermatitis/pruritus. Her previous allergy office visit was on 11/03/2021 with Gareth Morgan, [place]. Today is a regular follow up visit.  Asthma  Begin montelukast 10 mg once a day to prevent cough or wheeze.Patient cautioned that rarely some children/adults can experience behavioral changes after beginning montelukast. These side effects are rare, however, if you notice any change, notify the clinic and discontinue montelukast. Continue Symbicort 80-2 puffs twice a day with a spacer to prevent cough or wheeze  Continue albuterol 2 puffs once every 4 hours as needed for cough or wheeze You may use albuterol 2 puffs 5 to 15 minutes before activity to decrease cough or  wheeze  Allergic rhinitis Continue allergen avoidance measures directed toward tree pollen, dust mite, and mold as listed below Begin montelukast as listed above Begin Flonase 2 sprays in each nostril once a day for nasal congestion Consider saline nasal rinses as needed for nasal symptoms. Use this before any medicated nasal sprays for best result Consider updating your environmental allergy testing when it is convenient for you.  Remember to stop antihistamines for 3 days before your allergy testing appointment   Allergic conjunctivitis Begin olopatadine 1 drop in each eye once a day as needed for red or itchy eyes   Reflux Continue dietary and lifestyle modifications as listed below   Atopic dermatitis/pruritus Continue a daily moisturizing  routine Continue triamcinolone 0.1% cream to red itchy areas below your face twice a day as needed.  Do not use this medication longer than 3 weeks in a row   Call the clinic if this treatment plan is not working well for you.   Follow up in 2 months or sooner if needed.  Assessment and Plan: Rachel Crane is a 76 y.o. female with: No problem-specific Assessment & Plan notes found for this encounter.  No follow-ups on file.  No orders of the defined types were placed in this encounter.  Lab Orders  No laboratory test(s) ordered today    Diagnostics: Spirometry:  Tracings reviewed. Her effort: {Blank single:19197::"Good reproducible efforts.","It was hard to get consistent efforts and there is a question as to whether this reflects a maximal maneuver.","Poor effort, data can not be interpreted."} FVC: ***L FEV1: ***L, ***% predicted FEV1/FVC ratio: ***% Interpretation: {Blank single:19197::"Spirometry consistent with mild obstructive disease","Spirometry consistent with moderate obstructive disease","Spirometry consistent with severe obstructive disease","Spirometry consistent with possible restrictive disease","Spirometry consistent with mixed obstructive and restrictive disease","Spirometry uninterpretable due to technique","Spirometry consistent with normal pattern","No overt abnormalities noted given today's efforts"}.  Please see scanned spirometry results for details.  Skin Testing: {Blank single:19197::"Select foods","Environmental allergy panel","Environmental allergy panel and select foods","Food allergy panel","None","Deferred due to recent antihistamines use"}. *** Results discussed with patient/family.   Medication List:  Current Outpatient Medications  Medication Sig Dispense Refill   albuterol (VENTOLIN HFA) 108 (90 Base) MCG/ACT inhaler INHALE 1 TO 2 PUFFS INTO THE LUNGS EVERY 4 HOURS AS NEEDED FOR WHEEZING OR SHORTNESS OF BREATH 8.5 g 0   azelastine (ASTELIN) 0.1 % nasal  spray Place 2 sprays into both  nostrils 2 (two) times daily as needed (Nasal drainage). Use in each nostril as directed (Patient not taking: Reported on 11/03/2021) 30 mL 5   benzonatate (TESSALON) 200 MG capsule Take 1 capsule (200 mg total) by mouth 3 (three) times daily as needed for cough. 30 capsule 0   budesonide-formoterol (SYMBICORT) 80-4.5 MCG/ACT inhaler 2 Puffs 2 times daily 1 each 0   chlorthalidone (HYGROTON) 25 MG tablet Take 25 mg by mouth daily.      cholecalciferol (VITAMIN D) 25 MCG (1000 UNIT) tablet Take 1,000 Units by mouth daily.     famotidine (PEPCID) 20 MG tablet Take 1 tablet (20 mg total) by mouth 2 (two) times daily. 30 tablet 0   guaiFENesin-codeine 100-10 MG/5ML syrup Take 5 mLs by mouth at bedtime as needed for cough. 118 mL 0   JARDIANCE 10 MG TABS tablet Take 10 mg by mouth daily.     latanoprost (XALATAN) 0.005 % ophthalmic solution      loratadine (CLARITIN) 10 MG tablet TAKE 1 TABLET(10 MG) BY MOUTH DAILY 90 tablet 1   losartan (COZAAR) 100 MG tablet Take 1 tablet (100 mg total) by mouth daily. 30 tablet 3   montelukast (SINGULAIR) 10 MG tablet Take 1 tablet (10 mg total) by mouth at bedtime. 30 tablet 5   ondansetron (ZOFRAN) 4 MG tablet Take 1 tablet (4 mg total) by mouth every 8 (eight) hours as needed for nausea or vomiting. Take to help calm the stomach. 20 tablet 0   Potassium Chloride ER 20 MEQ TBCR Take 1 tablet by mouth daily.     pravastatin (PRAVACHOL) 40 MG tablet Take 40 mg by mouth daily.      predniSONE (DELTASONE) 20 MG tablet Take 1 tablet (20 mg total) by mouth daily with breakfast. 3 tablet 0   triamcinolone ointment (KENALOG) 0.1 % Apply 1 application topically 2 (two) times daily as needed. 454 g 1   No current facility-administered medications for this visit.   Allergies: Allergies  Allergen Reactions   Hydrocodone-Acetaminophen     Hives and nausea   I reviewed her past medical history, social history, family history, and  environmental history and no significant changes have been reported from her previous visit.  Review of Systems  Constitutional:  Negative for appetite change, chills, fever and unexpected weight change.  HENT:  Negative for congestion and rhinorrhea.   Eyes:  Negative for itching.  Respiratory:  Negative for cough, chest tightness, shortness of breath and wheezing.   Gastrointestinal:  Negative for abdominal pain.  Skin:  Negative for rash.  Allergic/Immunologic: Positive for environmental allergies.  Neurological:  Negative for headaches.    Objective: There were no vitals taken for this visit. There is no height or weight on file to calculate BMI. Physical Exam Vitals and nursing note reviewed.  Constitutional:      Appearance: Normal appearance. She is well-developed.  HENT:     Head: Normocephalic and atraumatic.     Right Ear: Tympanic membrane and external ear normal.     Left Ear: Tympanic membrane and external ear normal.     Nose: Nose normal.     Mouth/Throat:     Mouth: Mucous membranes are moist.     Pharynx: Oropharynx is clear.  Eyes:     Conjunctiva/sclera: Conjunctivae normal.  Cardiovascular:     Rate and Rhythm: Normal rate and regular rhythm.     Heart sounds: Normal heart sounds. No murmur heard. Pulmonary:  Effort: Pulmonary effort is normal.     Breath sounds: Normal breath sounds. No wheezing, rhonchi or rales.  Musculoskeletal:     Cervical back: Neck supple.  Skin:    General: Skin is warm.     Findings: No rash.  Neurological:     Mental Status: She is alert and oriented to person, place, and time.  Psychiatric:        Behavior: Behavior normal.    Previous notes and tests were reviewed. The plan was reviewed with the patient/family, and all questions/concerned were addressed.  It was my pleasure to see Rachel Crane today and participate in her care. Please feel free to contact me with any questions or concerns.  Sincerely,  Rexene Alberts,  DO Allergy & Immunology  [facility] office: [place] office: [phone]

## 2022-05-16 ENCOUNTER — Encounter: Payer: Self-pay | Admitting: Allergy

## 2022-05-16 ENCOUNTER — Ambulatory Visit (INDEPENDENT_AMBULATORY_CARE_PROVIDER_SITE_OTHER): Payer: Medicare Other | Admitting: Allergy

## 2022-05-16 VITALS — BP 132/58 | HR 71 | Temp 98.3°F | Resp 18

## 2022-05-16 DIAGNOSIS — J4540 Moderate persistent asthma, uncomplicated: Secondary | ICD-10-CM | POA: Diagnosis not present

## 2022-05-16 DIAGNOSIS — H1010 Acute atopic conjunctivitis, unspecified eye: Secondary | ICD-10-CM

## 2022-05-16 DIAGNOSIS — K2100 Gastro-esophageal reflux disease with esophagitis, without bleeding: Secondary | ICD-10-CM

## 2022-05-16 DIAGNOSIS — H1013 Acute atopic conjunctivitis, bilateral: Secondary | ICD-10-CM

## 2022-05-16 DIAGNOSIS — J302 Other seasonal allergic rhinitis: Secondary | ICD-10-CM

## 2022-05-16 DIAGNOSIS — L299 Pruritus, unspecified: Secondary | ICD-10-CM

## 2022-05-16 MED ORDER — MONTELUKAST SODIUM 10 MG PO TABS: 10.0000 mg | ORAL_TABLET | Freq: Every day | ORAL | 2 refills | Status: DC

## 2022-05-16 MED ORDER — ALBUTEROL SULFATE HFA 108 (90 BASE) MCG/ACT IN AERS: 2.0000 | INHALATION_SPRAY | RESPIRATORY_TRACT | 1 refills | Status: DC | PRN

## 2022-05-16 MED ORDER — FLUTICASONE PROPIONATE 50 MCG/ACT NA SUSP: 2.0000 | Freq: Every day | NASAL | 2 refills | Status: DC | PRN

## 2022-05-16 MED ORDER — EPICERAM EX EMUL: CUTANEOUS | 3 refills | Status: DC

## 2022-05-16 MED ORDER — TRIAMCINOLONE ACETONIDE 0.1 % EX OINT: 1.0000 | TOPICAL_OINTMENT | Freq: Two times a day (BID) | CUTANEOUS | 0 refills | Status: DC | PRN

## 2022-05-16 NOTE — Assessment & Plan Note (Signed)
Currently not on Symbicort and Singulair and using albuterol two times per week.  Today's spirometry was normal. . Daily controller medication(s): Start Singulair (montelukast) '10mg'$  daily at night. . May use albuterol rescue inhaler 2 puffs every 4 to 6 hours as needed for shortness of breath, chest tightness, coughing, and wheezing. Monitor frequency of use.  . Get spirometry at next visit. . Get flu shot.

## 2022-05-16 NOTE — Assessment & Plan Note (Signed)
Itchy, dry skin without rash. . See below for proper skin care. . Use triamcinolone 0.1% ointment twice a day as needed for rash flares. Do not use on the face, neck, armpits or groin area. Do not use more than 3 weeks in a row.  . Use epicerum twice a day as a moisturizer. Samples given.

## 2022-05-16 NOTE — Assessment & Plan Note (Signed)
Some nasal congestion.  . Continue allergen avoidance measures directed toward tree pollen, dust mite, and mold as listed below. . Start Singulair (montelukast) '10mg'$  daily at night. . Use Flonase (fluticasone) nasal spray 2 sprays per nostril once a day as needed for nasal congestion. OR . Use Nasacort (triamcinolone) nasal spray 2 sprays per nostril once a day as needed for nasal congestion. Sample given. . Nasal saline spray (i.e., Simply Saline) or nasal saline lavage (i.e., NeilMed) is recommended as needed and prior to medicated nasal sprays.

## 2022-05-16 NOTE — Patient Instructions (Addendum)
Breathing Your test was normal. Daily controller medication(s): Start Singulair (montelukast) '10mg'$  daily at night. May use albuterol rescue inhaler 2 puffs every 4 to 6 hours as needed for shortness of breath, chest tightness, coughing, and wheezing. Monitor frequency of use.  Breathing control goals:  Full participation in all desired activities (may need albuterol before activity) Albuterol use two times or less a week on average (not counting use with activity) Cough interfering with sleep two times or less a month Oral steroids no more than once a year No hospitalizations   Environmental allergies Continue allergen avoidance measures directed toward tree pollen, dust mite, and mold as listed below. Start Singulair (montelukast) '10mg'$  daily at night. Use Flonase (fluticasone) nasal spray 2 sprays per nostril once a day as needed for nasal congestion. OR Use Nasacort (triamcinolone) nasal spray 2 sprays per nostril once a day as needed for nasal congestion. Sample given. Nasal saline spray (i.e., Simply Saline) or nasal saline lavage (i.e., NeilMed) is recommended as needed and prior to medicated nasal sprays.  Skin Use triamcinolone 0.1% ointment twice a day as needed for rash flares. Do not use on the face, neck, armpits or groin area. Do not use more than 3 weeks in a row.  Use epicerum twice a day as a moisturizer.  Follow up in 6 months or sooner if needed.  Skin care recommendations  Bath time: Always use lukewarm water. AVOID very hot or cold water. Keep bathing time to 5-10 minutes. Do NOT use bubble bath. Use a mild soap and use just enough to wash the dirty areas. Do NOT scrub skin vigorously.  After bathing, pat dry your skin with a towel. Do NOT rub or scrub the skin.  Moisturizers and prescriptions:  ALWAYS apply moisturizers immediately after bathing (within 3 minutes). This helps to lock-in moisture. Use the moisturizer several times a day over the whole  body. Good summer moisturizers include: Aveeno, CeraVe, Cetaphil. Good winter moisturizers include: Aquaphor, Vaseline, Cerave, Cetaphil, Eucerin, Vanicream. When using moisturizers along with medications, the moisturizer should be applied about one hour after applying the medication to prevent diluting effect of the medication or moisturize around where you applied the medications. When not using medications, the moisturizer can be continued twice daily as maintenance.  Laundry and clothing: Avoid laundry products with added color or perfumes. Use unscented hypo-allergenic laundry products such as Tide free, Cheer free & gentle, and All free and clear.  If the skin still seems dry or sensitive, you can try double-rinsing the clothes. Avoid tight or scratchy clothing such as wool. Do not use fabric softeners or dyer sheets.  Reducing Pollen Exposure Pollen seasons: trees (spring), grass (summer) and ragweed/weeds (fall). Keep windows closed in your home and car to lower pollen exposure.  Install air conditioning in the bedroom and throughout the house if possible.  Avoid going out in dry windy days - especially early morning. Pollen counts are highest between 5 - 10 AM and on dry, hot and windy days.  Save outside activities for late afternoon or after a heavy rain, when pollen levels are lower.  Avoid mowing of grass if you have grass pollen allergy. Be aware that pollen can also be transported indoors on people and pets.  Dry your clothes in an automatic dryer rather than hanging them outside where they might collect pollen.  Rinse hair and eyes before bedtime. Control of House Dust Mite Allergen Dust mite allergens are a common trigger of allergy and asthma symptoms.  While they can be found throughout the house, these microscopic creatures thrive in warm, humid environments such as bedding, upholstered furniture and carpeting. Because so much time is spent in the bedroom, it is essential  to reduce mite levels there.  Encase pillows, mattresses, and box springs in special allergen-proof fabric covers or airtight, zippered plastic covers.  Bedding should be washed weekly in hot water (130 F) and dried in a hot dryer. Allergen-proof covers are available for comforters and pillows that can't be regularly washed.  Wash the allergy-proof covers every few months. Minimize clutter in the bedroom. Keep pets out of the bedroom.  Keep humidity less than 50% by using a dehumidifier or air conditioning. You can buy a humidity measuring device called a hygrometer to monitor this.  If possible, replace carpets with hardwood, linoleum, or washable area rugs. If that's not possible, vacuum frequently with a vacuum that has a HEPA filter. Remove all upholstered furniture and non-washable window drapes from the bedroom. Remove all non-washable stuffed toys from the bedroom.  Wash stuffed toys weekly. Mold Control Mold and fungi can grow on a variety of surfaces provided certain temperature and moisture conditions exist.  Outdoor molds grow on plants, decaying vegetation and soil. The major outdoor mold, Alternaria and Cladosporium, are found in very high numbers during hot and dry conditions. Generally, a late summer - fall peak is seen for common outdoor fungal spores. Rain will temporarily lower outdoor mold spore count, but counts rise rapidly when the rainy period ends. The most important indoor molds are Aspergillus and Penicillium. Dark, humid and poorly ventilated basements are ideal sites for mold growth. The next most common sites of mold growth are the bathroom and the kitchen. Outdoor (Seasonal) Mold Control Use air conditioning and keep windows closed. Avoid exposure to decaying vegetation. Avoid leaf raking. Avoid grain handling. Consider wearing a face mask if working in moldy areas.  Indoor (Perennial) Mold Control  Maintain humidity below 50%. Get rid of mold growth on hard  surfaces with water, detergent and, if necessary, 5% bleach (do not mix with other cleaners). Then dry the area completely. If mold covers an area more than 10 square feet, consider hiring an indoor environmental professional. For clothing, washing with soap and water is best. If moldy items cannot be cleaned and dried, throw them away. Remove sources e.g. contaminated carpets. Repair and seal leaking roofs or pipes. Using dehumidifiers in damp basements may be helpful, but empty the water and clean units regularly to prevent mildew from forming. All rooms, especially basements, bathrooms and kitchens, require ventilation and cleaning to deter mold and mildew growth. Avoid carpeting on concrete or damp floors, and storing items in damp areas.

## 2022-05-24 DIAGNOSIS — Z23 Encounter for immunization: Secondary | ICD-10-CM | POA: Diagnosis not present

## 2022-05-29 DIAGNOSIS — M5416 Radiculopathy, lumbar region: Secondary | ICD-10-CM | POA: Diagnosis not present

## 2022-05-30 ENCOUNTER — Ambulatory Visit
Admission: RE | Admit: 2022-05-30 | Discharge: 2022-05-30 | Disposition: A | Discharge status: Home / Self Care | Payer: Medicare Other | Source: Ambulatory Visit | Attending: Internal Medicine | Admitting: Internal Medicine

## 2022-05-30 DIAGNOSIS — Z1231 Encounter for screening mammogram for malignant neoplasm of breast: Secondary | ICD-10-CM

## 2022-06-04 ENCOUNTER — Other Ambulatory Visit: Payer: Self-pay | Admitting: Orthopedic Surgery

## 2022-06-04 DIAGNOSIS — M545 Low back pain, unspecified: Secondary | ICD-10-CM

## 2022-06-18 DIAGNOSIS — H401131 Primary open-angle glaucoma, bilateral, mild stage: Secondary | ICD-10-CM | POA: Diagnosis not present

## 2022-06-19 ENCOUNTER — Telehealth: Payer: Self-pay | Admitting: Adult Health

## 2022-06-19 DIAGNOSIS — G4733 Obstructive sleep apnea (adult) (pediatric): Secondary | ICD-10-CM

## 2022-06-19 NOTE — Telephone Encounter (Signed)
Ok to order a new machine without sleep study since her machine is messing up

## 2022-06-19 NOTE — Telephone Encounter (Signed)
Order placed for CPAP 10cm water pressure, with supplies.

## 2022-06-19 NOTE — Telephone Encounter (Signed)
Pt states that she has been having problems with her cpap machine and that she has called the DME regarding it but they informed her that they have been reaching out to the office for a new Rx for the replacement and they have not received an answer yet. Pt will be leaving to [place] on the 14th so she will be needing this as soon as possible. Please advise.

## 2022-06-19 NOTE — Telephone Encounter (Signed)
Pt is requesting to have a order for a new machine, to [facility] since her machine has not been working well since she had gotten back from [place] in March,  she went in January.   We have no documentation other then 05-08-2022 that she had tried to reach [place] and we responded back with mychart.  She did not get this.  I relayed that normally if new machine is requested, and sleep test has not been done, and hers since 2018 a sleep study is the usual protocol.  She has an appt 06-27-2022, but she says she needs the machine for her trip.  She wanted to speak to you, I relayed that I would relay the message.

## 2022-06-20 NOTE — Telephone Encounter (Signed)
I called pt and relayed that MM/NP did ok new machine order for her.   [facility] is needing f23fappt to be able to get new machine.  Spoke with JM/NP if ok to see since time sensitive ([place]  I relayed that could do VV, but need DL (if anything different) then the 10-05-21 remote on airview.  Pt says she has used but using tape to connect. Appt made for her to come in with machine 06-21-22 at 0815 and see JM/NP. Pt verbalized understanding.

## 2022-06-20 NOTE — Telephone Encounter (Signed)
Order signed for new CPAP machine.

## 2022-06-20 NOTE — Telephone Encounter (Signed)
RE: new machine Received: Today New, Willodean Rosenthal, RN; Miquel Dunn Unfortunately she will need a visit note within the last year showing she was using and benefiting from the pap unit before it broke. this an be add to any recent note or patient will need an office visit.  I can not take notes older that one year.  Insurance will not process without this note.  Thanks,  Leroy Sea New     Previous Messages    ----- Message ----- From: Brandon Melnick, RN Sent: 06/20/2022  11:29 AM EST To: Leory Plowman New Subject: FW: new machine                                All I have is 06-27-2021 last VV for this which is in EPIC.  She called about getting new machine as was eligible, MM/NP said was ok to order.  She said the machine  was broken, not working since 09/2021 when got back from [place]. Newman Pies ----- Message ----- From: Miquel Dunn Sent: 06/20/2022  11:00 AM EST To: Darlina Guys; Miquel Dunn; Nash Shearer; * Subject: RE: new machine                                Hello Katharine Look,  I am in need of face 2 face OV note showing usage and benefit to process this order request.  I have entered the order but have placed it on hold until we get the face 2 face notes.  It may be that the notes from yesterday has not been uploaded yet.  Thank you,  Leroy Sea New  ----- Message ----- From: Miquel Dunn Sent: 06/20/2022   8:46 AM EST To: Darlina Guys; Miquel Dunn; Nash Shearer; * Subject: RE: new machine                                Received, Thank you!  ----- Message ----- From: Brandon Melnick, RN Sent: 06/19/2022   4:36 PM EST To: Darlina Guys; Miquel Dunn; Nash Shearer; * Subject: FW: new machine                                New order in EPIC for new machine.   Terrea S. Banko Female, 76 y.o., 09-Jun-1946 MRN: [id number] Phone: [phone] (M)  Thank you  Lovey Newcomer RN  ----- Message ----- From: Miquel Dunn Sent: 06/19/2022   4:22 PM EST To: Darlina Guys; Miquel Dunn; Nash Shearer; * Subject: RE: new machine                                hello Katharine Look.  Per note in the account it looks like she is Eligible for a new pap unit as far as time frame. She also got supplies as recent as 04-12-22.  Last data from air view shows she was setup on 10-31-16, she has a Air sense 10 cpap and she was 57% complaint on her last 30 days of downloadable info ( 09-26-21) was last date of info.  We will need f5fOV note showing usage and benefit along with new order if you would like to get them setup  with a new unit.  Thank you,  Brad New  ----- Message ----- From: Brandon Melnick, RN Sent: 06/19/2022   3:01 PM EST To: Darlina Guys; Miquel Dunn; Nash Shearer; * Subject: new machine                                    Hey could you look and tell me about this pt.  She is asking for a new machine and we have not really had any connect with her when she messaged back in 05-08-2022.  Anaria S. Bouza Female, 76 y.o., 19-Oct-1945 Pronouns: she/her/hers MRN: [id number] Phone: [phone]   [facility] RN

## 2022-06-21 ENCOUNTER — Ambulatory Visit (INDEPENDENT_AMBULATORY_CARE_PROVIDER_SITE_OTHER): Payer: Medicare Other | Admitting: Adult Health

## 2022-06-21 ENCOUNTER — Telehealth: Payer: Self-pay | Admitting: *Deleted

## 2022-06-21 ENCOUNTER — Encounter: Payer: Self-pay | Admitting: Adult Health

## 2022-06-21 VITALS — BP 147/76 | HR 75 | Ht 66.0 in | Wt 209.0 lb

## 2022-06-21 DIAGNOSIS — G4733 Obstructive sleep apnea (adult) (pediatric): Secondary | ICD-10-CM | POA: Diagnosis not present

## 2022-06-21 NOTE — Progress Notes (Signed)
[facility] [address] [place]. [place] [address] [phone]       OFFICE FOLLOW UP NOTE  [name] Date of Birth:  28-Jun-1946 Medical Record Number:  [id number]   Reason for visit: CPAP follow-up    SUBJECTIVE:   CHIEF COMPLAINT:  Chief Complaint  Patient presents with   Obstructive Sleep Apnea    Rm 3 alone Pt is well, here for CPAP replacement     HPI:   Update 06/21/2022 JM: Patient returns for CPAP visit.  Reports CPAP machine not working well since she returned back from [place] in January, tried to use up until around March but had difficulty using for long duration as it would make a loud noise. Stopped using mid March.  At prior visit with Rachel Browner, NP on 06/27/2021, DL report showed excellent compliance.  She is requesting a new machine as she did see great benefit while using machine.  Epworth Sleepiness Scale 4/24. Was required for in office visit prior to obtaining new machine.  She leaves next week for [place] and plans on returning end of January.          ROS:   14 system review of systems performed and negative with exception of those listed in HPI  PMH:  Past Medical History:  Diagnosis Date   Abnormal stress ECG 10/06/2018   Aortic valve sclerosis 10/06/2018   Arthritis    Asthma    Bilateral carotid bruits 10/06/2018   Headache    H/O bad HA, since using CPAP- she has had improvement with that issue    Hypertension    Laboratory examination 10/06/2018   OSA on CPAP    settting - 8, uses CPAP q night    Pre-diabetes    Shingles     PSH:  Past Surgical History:  Procedure Laterality Date   CHOLECYSTECTOMY  1998   TOTAL KNEE ARTHROPLASTY Right 09/19/2017   Procedure: RIGHT TOTAL KNEE ARTHROPLASTY;  Surgeon: Leandrew Koyanagi, MD;  Location: [facility];  Service: Orthopedics;  Laterality: Right;   TOTAL KNEE ARTHROPLASTY Left 06/06/2020   Procedure: LEFT TOTAL KNEE ARTHROPLASTY;  Surgeon: Leandrew Koyanagi, MD;   Location: [facility];  Service: Orthopedics;  Laterality: Left;   TUBAL LIGATION      Social History:  Social History   Socioeconomic History   Marital status: Widowed    Spouse name: Not on file   Number of children: 3   Years of education: Not on file   Highest education level: Not on file  Occupational History   Not on file  Tobacco Use   Smoking status: Never   Smokeless tobacco: Never  Vaping Use   Vaping Use: Never used  Substance and Sexual Activity   Alcohol use: Not Currently   Drug use: No   Sexual activity: Not on file  Other Topics Concern   Not on file  Social History Narrative   Not on file   Social Determinants of Health   Financial Resource Strain: Not on file  Food Insecurity: Not on file  Transportation Needs: Not on file  Physical Activity: Not on file  Stress: Not on file  Social Connections: Not on file  Intimate Partner Violence: Not on file    Family History:  Family History  Problem Relation Age of Onset   Breast cancer Sister    Hypertension Mother    Arthritis Mother     Medications:   Current Outpatient Medications on File  Prior to Visit  Medication Sig Dispense Refill   albuterol (VENTOLIN HFA) 108 (90 Base) MCG/ACT inhaler Inhale 2 puffs into the lungs every 4 (four) hours as needed for wheezing or shortness of breath (coughing fits). 36 g 1   chlorthalidone (HYGROTON) 25 MG tablet Take 25 mg by mouth daily.      cholecalciferol (VITAMIN D) 25 MCG (1000 UNIT) tablet Take 1,000 Units by mouth daily.     Dermatological Products, Misc. [facility]) lotion Apply twice a day to the body as a moisturizer 225 g 3   famotidine (PEPCID) 20 MG tablet Take 1 tablet (20 mg total) by mouth 2 (two) times daily. 30 tablet 0   fluticasone (FLONASE) 50 MCG/ACT nasal spray Place 2 sprays into both nostrils daily as needed (nasal congestion). 48 g 2   JARDIANCE 10 MG TABS tablet Take 10 mg by mouth daily.     loratadine (CLARITIN) 10 MG tablet TAKE 1  TABLET(10 MG) BY MOUTH DAILY 90 tablet 1   losartan (COZAAR) 100 MG tablet Take 1 tablet (100 mg total) by mouth daily. 30 tablet 3   Potassium Chloride ER 20 MEQ TBCR Take 1 tablet by mouth daily.     pravastatin (PRAVACHOL) 40 MG tablet Take 40 mg by mouth daily.      No current facility-administered medications on file prior to visit.    Allergies:   Allergies  Allergen Reactions   Hydrocodone-Acetaminophen     Hives and nausea      OBJECTIVE:  Physical Exam  Vitals:   06/21/22 0820  BP: (!) 147/76  Pulse: 75  Weight: 209 lb (94.8 kg)  Height: '5\' 6"'$  (1.676 m)   Body mass index is 33.73 kg/m. No results found.   General: well developed, well nourished, very pleasant elderly female, seated, in no evident distress Head: head normocephalic and atraumatic.   Neck: supple with no carotid or supraclavicular bruits Cardiovascular: regular rate and rhythm, no murmurs Musculoskeletal: no deformity Skin:  no rash/petichiae Vascular:  Normal pulses all extremities   Neurologic Exam Mental Status: Awake and fully alert. Oriented to place and time. Recent and remote memory intact. Attention span, concentration and fund of knowledge appropriate. Mood and affect appropriate.  Cranial Nerves: Pupils equal, briskly reactive to light. Extraocular movements full without nystagmus. Visual fields full to confrontation. Hearing intact. Facial sensation intact. Face, tongue, palate moves normally and symmetrically.  Motor: Normal bulk and tone. Normal strength in all tested extremity muscles Sensory.: intact to touch , pinprick , position and vibratory sensation.  Coordination: Rapid alternating movements normal in all extremities. Finger-to-nose and heel-to-shin performed accurately bilaterally. Gait and Station: Arises from chair without difficulty. Stance is normal. Gait demonstrates normal stride length and balance without use of AD. Tandem walk and heel toe without difficulty.   Reflexes: 1+ and symmetric. Toes downgoing.         ASSESSMENT/PLAN: Rachel Crane is a 76 y.o. year old female    Sleep apnea: needs new CPAP machine as prior machine no longer working correctly. Has not been able to use since March. Order to obtain new machine already placed, will notify [facility] of today's appointment so patient can obtain new machine. Patient will call office once new machine obtained to schedule 30-90 day follow up visit with Jinny Blossom, NP per insurance requirements     CC:  PCP: Leeroy Cha, MD    I spent 22 minutes of face-to-face and non-face-to-face time with patient.  This included previsit  chart review, lab review, study review, order entry, electronic health record documentation, patient education regarding  sleep apnea with review and discussion of compliance report and answered all other questions to patient's satisfaction   Frann Rider, [facility]  [facility] [address] [place], [place] [address]  Phone [phone] Fax [phone] Note: This document was prepared with digital dictation and possible smart phrase technology. Any transcriptional errors that result from this process are unintentional.

## 2022-06-21 NOTE — Patient Instructions (Signed)
Your Plan:  You will be contacted by [facility] to receive new CPAP machine    Call office once you receive new machine to schedule a f/u visit in 30-90 days     Thank you for coming to see [place] at [facility]. I hope we have been able to provide you high quality care today.  You may receive a patient satisfaction survey over the next few weeks. We would appreciate your feedback and comments so that we may continue to improve ourselves and the health of our patients.

## 2022-06-21 NOTE — Telephone Encounter (Signed)
Sent message to [facility] concerning seen pt in office today.  Order already sent to [facility] for new machine.

## 2022-06-21 NOTE — Telephone Encounter (Signed)
RE: new machine for patient  was seen today in the office. Received: Today Stenson, Kristine Garbe, RN; Darlina Guys; Minus Liberty; Nash Shearer; New, Manuella Ghazi it [place].  Thank you!     Previous Messages    ----- Message ----- From: Brandon Melnick, RN Sent: 06/21/2022   4:00 PM EST To: Darlina Guys; Miquel Dunn; Nash Shearer; * Subject: new machine for patient  was seen today in t*  Hi yo all,  I have sent order and Leroy Sea is aware  but needed an updated office visit. She is going  out of country and is trying to get new machine prior to 06-28-2022.  Appreciate your help on this.     Adjoa S. Ronning Female, 76 y.o., 28-Jul-1945 Pronouns: she/her/hers MRN: [id number]  [place]

## 2022-06-21 NOTE — Telephone Encounter (Signed)
-----   Message from Frann Rider, NP sent at 06/21/2022  9:09 AM EST ----- Please let [facility] know of today's visit so she can hopefully obtain new machine prior to leaving next week on 12/14. Thank you! Patient will need f/u visit with Megan in 30-90 days after receiving, patient is aware to call office to schedule once she receives new machine.

## 2022-06-22 ENCOUNTER — Ambulatory Visit: Payer: Self-pay | Admitting: Cardiology

## 2022-06-27 ENCOUNTER — Ambulatory Visit: Payer: Medicare Other | Admitting: Adult Health

## 2022-06-27 NOTE — Progress Notes (Signed)
No Show

## 2022-07-20 ENCOUNTER — Other Ambulatory Visit: Payer: Self-pay | Admitting: Orthopedic Surgery

## 2022-07-20 DIAGNOSIS — M545 Low back pain, unspecified: Secondary | ICD-10-CM

## 2022-08-22 DIAGNOSIS — E1169 Type 2 diabetes mellitus with other specified complication: Secondary | ICD-10-CM | POA: Diagnosis not present

## 2022-08-22 DIAGNOSIS — I10 Essential (primary) hypertension: Secondary | ICD-10-CM | POA: Diagnosis not present

## 2022-09-03 ENCOUNTER — Ambulatory Visit (HOSPITAL_COMMUNITY)
Admission: EM | Admit: 2022-09-03 | Discharge: 2022-09-03 | Disposition: A | Discharge status: Home / Self Care | Payer: Medicare Other | Attending: Family Medicine | Admitting: Family Medicine

## 2022-09-03 ENCOUNTER — Encounter (HOSPITAL_COMMUNITY): Payer: Self-pay

## 2022-09-03 DIAGNOSIS — J301 Allergic rhinitis due to pollen: Secondary | ICD-10-CM | POA: Diagnosis not present

## 2022-09-03 DIAGNOSIS — J4521 Mild intermittent asthma with (acute) exacerbation: Secondary | ICD-10-CM | POA: Diagnosis not present

## 2022-09-03 MED ORDER — PREDNISONE 20 MG PO TABS: 40.0000 mg | ORAL_TABLET | Freq: Every day | ORAL | 0 refills | Status: AC

## 2022-09-03 NOTE — ED Triage Notes (Signed)
Patient c/o of an itchy throat, a non productive cough x 3 days.  Patient states she has used her Albuterol inhaler and took Tylenol ES at 0700 today.

## 2022-09-03 NOTE — ED Provider Notes (Addendum)
[place]    CSN: [id number] Arrival date & time: 09/03/22  [id number]      History   Chief Complaint Chief Complaint  Patient presents with   Cough   Allergies    HPI Rachel Crane is a 77 y.o. female.    Cough  For itching in her throat and postnasal drainage.  She has had minimal nasal congestion.  Also she has had some dry cough.  This began bothering her February 16.  No fever or chills.  She has had to use her albuterol inhaler this morning due to some wheezing and asthma trouble.  She tells me that on February 16 she stayed in the bed for the next day or 2, but then when I asked her she feels tired she says she just did that because she did not have anything else to do   No nausea or vomiting or diarrhea.  She has not done a home COVID swab, and does not think she could possibly have it at this time.  She has had a COVID-vaccine in the past, but not the latest one that is available.    Past Medical History:  Diagnosis Date   Abnormal stress ECG 10/06/2018   Aortic valve sclerosis 10/06/2018   Arthritis    Asthma    Bilateral carotid bruits 10/06/2018   Headache    H/O bad HA, since using CPAP- she has had improvement with that issue    Hypertension    Laboratory examination 10/06/2018   OSA on CPAP    settting - 8, uses CPAP q night    Pre-diabetes    Shingles     Patient Active Problem List   Diagnosis Date Noted   Status post total right knee replacement 02/15/2022   Chronic low back pain 02/15/2022   Moderate persistent asthma without complication [id number]   Gastroesophageal reflux disease with esophagitis 11/03/2021   Status post total left knee replacement 06/06/2020   Positive ANA (antinuclear antibody) 04/25/2020   Primary osteoarthritis of left knee 04/14/2020   Lumbar radiculopathy 06/26/2019   Left-sided low back pain with left-sided sciatica 06/26/2019   Thoracic spinal stenosis 04/18/2019   Seasonal allergies 12/04/2018   Seasonal  and perennial allergic rhinoconjunctivitis 10/10/2018   Moderate persistent asthma with acute exacerbation 10/10/2018   Pruritus 10/10/2018   Aortic valve sclerosis 10/06/2018   Bilateral carotid bruits 10/06/2018   Abnormal stress ECG 10/06/2018   Laboratory examination 10/06/2018   Palpitations 10/06/2018   Total knee replacement status 09/19/2017   Sprain of anterior talofibular ligament of right ankle 12/13/2016    Past Surgical History:  Procedure Laterality Date   CHOLECYSTECTOMY  1998   TOTAL KNEE ARTHROPLASTY Right 09/19/2017   Procedure: RIGHT TOTAL KNEE ARTHROPLASTY;  Surgeon: Leandrew Koyanagi, MD;  Location: [facility];  Service: Orthopedics;  Laterality: Right;   TOTAL KNEE ARTHROPLASTY Left 06/06/2020   Procedure: LEFT TOTAL KNEE ARTHROPLASTY;  Surgeon: Leandrew Koyanagi, MD;  Location: [facility];  Service: Orthopedics;  Laterality: Left;   TUBAL LIGATION      OB History   No obstetric history on file.      Home Medications    Prior to Admission medications   Medication Sig Start Date End Date Taking? Authorizing Provider  predniSONE (DELTASONE) 20 MG tablet Take 2 tablets (40 mg total) by mouth daily with breakfast for 3 days. 09/03/22 09/06/22 Yes Barrett Henle, MD  albuterol (VENTOLIN HFA) 108 (90 Base) MCG/ACT inhaler Inhale  2 puffs into the lungs every 4 (four) hours as needed for wheezing or shortness of breath (coughing fits). 05/16/22   Garnet Sierras, DO  chlorthalidone (HYGROTON) 25 MG tablet Take 25 mg by mouth daily.  11/03/19   [provider]  cholecalciferol (VITAMIN D) 25 MCG (1000 UNIT) tablet Take 1,000 Units by mouth daily.    [provider]  Dermatological Products, Misc. [facility]) lotion Apply twice a day to the body as a moisturizer 05/16/22   Garnet Sierras, DO  famotidine (PEPCID) 20 MG tablet Take 1 tablet (20 mg total) by mouth 2 (two) times daily. 02/12/22   Nyoka Lint, PA-C  fluticasone [facility]) 50 MCG/ACT nasal spray Place 2 sprays into  both nostrils daily as needed (nasal congestion). 05/16/22   Garnet Sierras, DO  JARDIANCE 10 MG TABS tablet Take 10 mg by mouth daily. 06/28/21   [provider]  loratadine (CLARITIN) 10 MG tablet TAKE 1 TABLET(10 MG) BY MOUTH DAILY 05/24/21   Kennith Gain, MD  losartan (COZAAR) 100 MG tablet Take 1 tablet (100 mg total) by mouth daily. 12/04/18   [name], NP  Potassium Chloride ER 20 MEQ TBCR Take 1 tablet by mouth daily. 08/09/20   [provider]  pravastatin (PRAVACHOL) 40 MG tablet Take 40 mg by mouth daily.  03/31/19   [provider]    Family History Family History  Problem Relation Age of Onset   Breast cancer Sister    Hypertension Mother    Arthritis Mother     Social History Social History   Tobacco Use   Smoking status: Never   Smokeless tobacco: Never  Vaping Use   Vaping Use: Never used  Substance Use Topics   Alcohol use: Not Currently   Drug use: No     Allergies   Hydrocodone-acetaminophen   Review of Systems Review of Systems  Respiratory:  Positive for cough.      Physical Exam Triage Vital Signs ED Triage Vitals  Enc Vitals Group     BP 09/03/22 0912 132/83     Pulse Rate 09/03/22 0912 83     Resp 09/03/22 0912 16     Temp 09/03/22 0912 97.9 F (36.6 C)     Temp Source 09/03/22 0912 Oral     SpO2 09/03/22 0912 96 %     Weight --      Height --      Head Circumference --      Peak Flow --      Pain Score 09/03/22 0913 0     Pain Loc --      Pain Edu? --      Excl. in [place]? --    No data found.  Updated Vital Signs BP 132/83 (BP Location: Left Arm)   Pulse 83   Temp 97.9 F (36.6 C) (Oral)   Resp 16   SpO2 96%   Visual Acuity Right Eye Distance:   Left Eye Distance:   Bilateral Distance:    Right Eye Near:   Left Eye Near:    Bilateral Near:     Physical Exam Vitals reviewed.  Constitutional:      General: She is not in acute distress.    Appearance: She is not  ill-appearing, toxic-appearing or diaphoretic.  HENT:     Right Ear: Tympanic membrane and ear canal normal.     Left Ear: Tympanic membrane and ear canal normal.     Nose:  Congestion present.     Mouth/Throat:     Mouth: Mucous membranes are moist.     Comments: There is some clear mucus draining.  No erythema of the tonsils.  Tonsils are 1 plus and symmetrical Eyes:     Extraocular Movements: Extraocular movements intact.     Conjunctiva/sclera: Conjunctivae normal.     Pupils: Pupils are equal, round, and reactive to light.  Cardiovascular:     Rate and Rhythm: Normal rate and regular rhythm.     Heart sounds: No murmur heard. Pulmonary:     Effort: Pulmonary effort is normal. No respiratory distress.     Breath sounds: No stridor. No wheezing, rhonchi or rales.  Musculoskeletal:     Cervical back: Neck supple.  Lymphadenopathy:     Cervical: No cervical adenopathy.  Skin:    Coloration: Skin is not jaundiced or pale.  Neurological:     General: No focal deficit present.     Mental Status: She is alert and oriented to person, place, and time.  Psychiatric:        Behavior: Behavior normal.      UC Treatments / Results  Labs (all labs ordered are listed, but only abnormal results are displayed) Labs Reviewed - No data to display  EKG   Radiology No results found.  Procedures Procedures (including critical care time)  Medications Ordered in UC Medications - No data to display  Initial Impression / Assessment and Plan / UC Course  I have reviewed the triage vital signs and the nursing notes.  Pertinent labs & imaging results that were available during my care of the patient were reviewed by me and considered in my medical decision making (see chart for details).        I discussed with her that this could potentially still be COVID, but she is resistant to the idea and declines COVID swab today.  Prednisone is sent in for 3 days for possible asthma  exacerbation and recommendations are made for nonsedating antihistamines.  Diabetes, and her last sugar was 101 this morning.  I did warn her about her sugars elevating some Final Clinical Impressions(s) / UC Diagnoses   Final diagnoses:  Seasonal allergic rhinitis due to pollen  Mild intermittent asthma with exacerbation     Discharge Instructions      Take prednisone 20 mg--2 daily for 3 days; this is to help your asthma and to reduce allergic reaction in your nose and airways.  Take Claritin/loratadine or Allegra/fexofenadine daily for your allergies.  These are antihistamines that do not make you sleepy or dizzy.      ED Prescriptions     Medication Sig Dispense Auth. Provider   predniSONE (DELTASONE) 20 MG tablet Take 2 tablets (40 mg total) by mouth daily with breakfast for 3 days. 6 tablet Mckenzee Beem, Gwenlyn Perking, MD      I have reviewed the PDMP during this encounter.   Barrett Henle, MD 09/03/22 [id number]    Barrett Henle, MD 09/03/22 [phone]

## 2022-09-03 NOTE — Discharge Instructions (Signed)
Take prednisone 20 mg--2 daily for 3 days; this is to help your asthma and to reduce allergic reaction in your nose and airways.  Take Claritin/loratadine or Allegra/fexofenadine daily for your allergies.  These are antihistamines that do not make you sleepy or dizzy.

## 2022-09-04 ENCOUNTER — Telehealth: Payer: Self-pay | Admitting: Allergy

## 2022-09-04 MED ORDER — LORATADINE 10 MG PO TABS: 10.0000 mg | ORAL_TABLET | Freq: Every day | ORAL | 5 refills | Status: DC

## 2022-09-04 NOTE — Telephone Encounter (Signed)
I sent in refill.  Not sure if covered as it is available OTC.

## 2022-09-04 NOTE — Telephone Encounter (Signed)
Patient is requesting refill for loratadine.   Patient is requesting a call once prescription is sent in.   Best contact number: [phone]  [place] [address]

## 2022-09-04 NOTE — Telephone Encounter (Signed)
Please advise on loratadine refill medication was not listed on 05/16/22 office visit AVS.

## 2022-09-04 NOTE — Telephone Encounter (Signed)
Patient has been notified and plans to pick the Claritin up from the pharmacy later on today.

## 2022-09-05 ENCOUNTER — Other Ambulatory Visit: Payer: Self-pay

## 2022-09-05 ENCOUNTER — Other Ambulatory Visit: Payer: Self-pay | Admitting: Physician Assistant

## 2022-09-05 ENCOUNTER — Telehealth: Payer: Self-pay | Admitting: Orthopaedic Surgery

## 2022-09-05 DIAGNOSIS — Z96651 Presence of right artificial knee joint: Secondary | ICD-10-CM

## 2022-09-05 DIAGNOSIS — Z96652 Presence of left artificial knee joint: Secondary | ICD-10-CM

## 2022-09-05 DIAGNOSIS — M545 Low back pain, unspecified: Secondary | ICD-10-CM

## 2022-09-05 DIAGNOSIS — G8929 Other chronic pain: Secondary | ICD-10-CM

## 2022-09-05 NOTE — Telephone Encounter (Signed)
Sounds like she is taking for her back.  I will send in one months worth, but could you have her fu with moore or megan if this is for her  back?

## 2022-09-05 NOTE — Telephone Encounter (Signed)
Spoke with patient. Explained that they recommend she see someone who primarily evaluates and treats the spine. She agrees. I cancelled her appointment next week and placed a referral for Dr.Moore.

## 2022-09-05 NOTE — Telephone Encounter (Signed)
I do not see in chart where we wrote this.  What dose is she on?  Ok to send to pt

## 2022-09-05 NOTE — Telephone Encounter (Signed)
Probably best to see dr Laurance Flatten for her back

## 2022-09-05 NOTE — Telephone Encounter (Signed)
Patient called needing Rx refilled Gabapentin. Patient has 4 tabs left. Patient asked if she can be referred for (PT) The number to contact patient is [phone]

## 2022-09-06 ENCOUNTER — Telehealth: Payer: Self-pay | Admitting: Physician Assistant

## 2022-09-06 NOTE — Telephone Encounter (Signed)
Patient called advised she spoke with the [facility] and they do not have the request to refill Rx (Gabapentin) I also spoke with the pharmacy and confirmed that the Rx was not received.  Patient asked if the Rx can be sent again? Patient said she is out of her medication. The number to contact patient is [phone]

## 2022-09-06 NOTE — Telephone Encounter (Signed)
Gabapentin 631m 1 tabler TID. I checked the chart and I don't see where it was sent yesterday.

## 2022-09-07 ENCOUNTER — Other Ambulatory Visit: Payer: Self-pay | Admitting: Physician Assistant

## 2022-09-07 MED ORDER — GABAPENTIN 600 MG PO TABS: 600.0000 mg | ORAL_TABLET | Freq: Three times a day (TID) | ORAL | 0 refills | Status: DC | PRN

## 2022-09-07 NOTE — Telephone Encounter (Signed)
Called and notified patient via voicemail.

## 2022-09-07 NOTE — Telephone Encounter (Signed)
I just resent

## 2022-09-11 ENCOUNTER — Telehealth: Payer: Self-pay | Admitting: Adult Health

## 2022-09-11 NOTE — Telephone Encounter (Signed)
Pt was scheduled for her initial CPAP on (11/29/22) Pt was informed to bring machine and power cord to the appointment.   DME in pt's SnapShot. between dates are 10/05/22 and 12/03/22

## 2022-09-12 NOTE — Patient Instructions (Incomplete)
Asthma  Begin montelukast 10 mg once a day to prevent cough or wheeze.Patient cautioned that rarely some children/adults can experience behavioral changes after beginning montelukast. These side effects are rare, however, if you notice any change, notify the clinic and discontinue montelukast. Continue Symbicort 80-2 puffs twice a day with a spacer to prevent cough or wheeze  Continue albuterol 2 puffs once every 4 hours as needed for cough or wheeze You may use albuterol 2 puffs 5 to 15 minutes before activity to decrease cough or  wheeze  Allergic rhinitis Continue allergen avoidance measures directed toward tree pollen, dust mite, and mold as listed below Begin montelukast as listed above Begin Flonase 2 sprays in each nostril once a day for nasal congestion Consider saline nasal rinses as needed for nasal symptoms. Use this before any medicated nasal sprays for best result Consider updating your environmental allergy testing when it is convenient for you.  Remember to stop antihistamines for 3 days before your allergy testing appointment  Allergic conjunctivitis Begin olopatadine 1 drop in each eye once a day as needed for red or itchy eyes  Reflux Continue dietary and lifestyle modifications as listed below  Atopic dermatitis/pruritus Continue a daily moisturizing routine Continue triamcinolone 0.1% cream to red itchy areas below your face twice a day as needed.  Do not use this medication longer than 3 weeks in a row  Call the clinic if this treatment plan is not working well for you.  Follow up in 2 months or sooner if needed.  Reducing Pollen Exposure The American Academy of Allergy, Asthma and Immunology suggests the following steps to reduce your exposure to pollen during allergy seasons. Do not hang sheets or clothing out to dry; pollen may collect on these items. Do not mow lawns or spend time around freshly cut grass; mowing stirs up pollen. Keep windows closed at night.   Keep car windows closed while driving. Minimize morning activities outdoors, a time when pollen counts are usually at their highest. Stay indoors as much as possible when pollen counts or humidity is high and on windy days when pollen tends to remain in the air longer. Use air conditioning when possible.  Many air conditioners have filters that trap the pollen spores. Use a HEPA room air filter to remove pollen form the indoor air you breathe.  Control of Mold Allergen Mold and fungi can grow on a variety of surfaces provided certain temperature and moisture conditions exist.  Outdoor molds grow on plants, decaying vegetation and soil.  The major outdoor mold, Alternaria and Cladosporium, are found in very high numbers during hot and dry conditions.  Generally, a late Summer - Fall peak is seen for common outdoor fungal spores.  Rain will temporarily lower outdoor mold spore count, but counts rise rapidly when the rainy period ends.  The most important indoor molds are Aspergillus and Penicillium.  Dark, humid and poorly ventilated basements are ideal sites for mold growth.  The next most common sites of mold growth are the bathroom and the kitchen.  Outdoor [employer] Use air conditioning and keep windows closed Avoid exposure to decaying vegetation. Avoid leaf raking. Avoid grain handling. Consider wearing a face mask if working in moldy areas.  Indoor Mold Control Maintain humidity below 50%. Clean washable surfaces with 5% bleach solution. Remove sources e.g. Contaminated carpets.   Control of Dust Mite Allergen Dust mites play a major role in allergic asthma and rhinitis. They occur in environments with high humidity  wherever human skin is found. Dust mites absorb humidity from the atmosphere (ie, they do not drink) and feed on organic matter (including shed human and animal skin). Dust mites are a microscopic type of insect that you cannot see with the naked eye. High levels of dust  mites have been detected from mattresses, pillows, carpets, upholstered furniture, bed covers, clothes, soft toys and any woven material. The principal allergen of the dust mite is found in its feces. A gram of dust may contain 1,000 mites and 250,000 fecal particles. Mite antigen is easily measured in the air during house cleaning activities. Dust mites do not bite and do not cause harm to humans, other than by triggering allergies/asthma.  Ways to decrease your exposure to dust mites in your home:  1. Encase mattresses, box springs and pillows with a mite-impermeable barrier or cover  2. Wash sheets, blankets and drapes weekly in hot water (130 F) with detergent and dry them in a dryer on the hot setting.  3. Have the room cleaned frequently with a vacuum cleaner and a damp dust-mop. For carpeting or rugs, vacuuming with a vacuum cleaner equipped with a high-efficiency particulate air (HEPA) filter. The dust mite allergic individual should not be in a room which is being cleaned and should wait 1 hour after cleaning before going into the room.  4. Do not sleep on upholstered furniture (eg, couches).  5. If possible removing carpeting, upholstered furniture and drapery from the home is ideal. Horizontal blinds should be eliminated in the rooms where the person spends the most time (bedroom, study, television room). Washable vinyl, roller-type shades are optimal.  6. Remove all non-washable stuffed toys from the bedroom. Wash stuffed toys weekly like sheets and blankets above.  7. Reduce indoor humidity to less than 50%. Inexpensive humidity monitors can be purchased at most hardware stores. Do not use a humidifier as can make the problem worse and are not recommended.

## 2022-09-12 NOTE — Progress Notes (Deleted)
   [place] [address] Dept: [phone]  FOLLOW UP NOTE  Patient ID: [name], female    DOB: [birth year]  Age: 77 y.o. MRN: [id number] Date of Office Visit: 09/13/2022  Assessment  Chief Complaint: No chief complaint on file.  HPI TAKYRA BLEAZARD is a 77 year old female who presents to the clinic for a follow up visit. She was last seen in this clinic on 05/16/2022 by Dr. Maudie Mercury for evaluation of asthma, allergic rhinitis, and pruritus with no rash.    Drug Allergies:  Allergies  Allergen Reactions   Hydrocodone-Acetaminophen     Hives and nausea    Physical Exam: There were no vitals taken for this visit.   Physical Exam  Diagnostics:    Assessment and Plan: No diagnosis found.  No orders of the defined types were placed in this encounter.   There are no Patient Instructions on file for this visit.  No follow-ups on file.    Thank you for the opportunity to care for this patient.  Please do not hesitate to contact me with questions.  Gareth Morgan, FNP [facility]

## 2022-09-13 ENCOUNTER — Ambulatory Visit: Payer: Medicare Other | Admitting: Family Medicine

## 2022-09-13 ENCOUNTER — Ambulatory Visit: Payer: Medicare Other | Admitting: Orthopaedic Surgery

## 2022-09-17 NOTE — Therapy (Signed)
OUTPATIENT PHYSICAL THERAPY LOWER EXTREMITY EVALUATION   Patient Name: RHETTA WEDLAKE MRN: [id number] DOB:[birth year], 77 y.o., female Today's Date: 09/18/2022  END OF SESSION:  PT End of Session - 09/18/22 1424     Visit Number 1    Number of Visits 16    Date for PT Re-Evaluation 11/16/22    PT Start Time 1345    PT Stop Time 1423    PT Time Calculation (min) 38 min    Activity Tolerance Patient tolerated treatment well    Behavior During Therapy [facility] for tasks assessed/performed             Past Medical History:  Diagnosis Date   Abnormal stress ECG 10/06/2018   Aortic valve sclerosis 10/06/2018   Arthritis    Asthma    Bilateral carotid bruits 10/06/2018   Headache    H/O bad HA, since using CPAP- she has had improvement with that issue    Hypertension    Laboratory examination 10/06/2018   OSA on CPAP    settting - 8, uses CPAP q night    Pre-diabetes    Shingles    Past Surgical History:  Procedure Laterality Date   CHOLECYSTECTOMY  1998   TOTAL KNEE ARTHROPLASTY Right 09/19/2017   Procedure: RIGHT TOTAL KNEE ARTHROPLASTY;  Surgeon: Tarry Kos, MD;  Location: [facility] OR;  Service: Orthopedics;  Laterality: Right;   TOTAL KNEE ARTHROPLASTY Left 06/06/2020   Procedure: LEFT TOTAL KNEE ARTHROPLASTY;  Surgeon: Tarry Kos, MD;  Location: [facility] OR;  Service: Orthopedics;  Laterality: Left;   TUBAL LIGATION     Patient Active Problem List   Diagnosis Date Noted   Status post total right knee replacement 02/15/2022   Chronic low back pain 02/15/2022   Moderate persistent asthma without complication 11/03/2021   Gastroesophageal reflux disease with esophagitis 11/03/2021   Status post total left knee replacement 06/06/2020   Positive ANA (antinuclear antibody) 04/25/2020   Primary osteoarthritis of left knee 04/14/2020   Lumbar radiculopathy 06/26/2019   Left-sided low back pain with left-sided sciatica 06/26/2019   Thoracic spinal stenosis 04/18/2019   Seasonal  allergies 12/04/2018   Seasonal and perennial allergic rhinoconjunctivitis 10/10/2018   Moderate persistent asthma with acute exacerbation 10/10/2018   Pruritus 10/10/2018   Aortic valve sclerosis 10/06/2018   Bilateral carotid bruits 10/06/2018   Abnormal stress ECG 10/06/2018   Laboratory examination 10/06/2018   Palpitations 10/06/2018   Total knee replacement status 09/19/2017   Sprain of anterior talofibular ligament of right ankle 12/13/2016    PCP: Lorenda Ishihara, MD   REFERRING PROVIDER:  Tarry Kos, MD   REFERRING DIAG: [phone] (ICD-10-CM) - Status post total left knee replacement Z96.651 (ICD-10-CM) - Status post total right knee replacement M54.50,G89.29 (ICD-10-CM) - Chronic low back pain, unspecified back pain laterality, unspecified whether sciatica present  THERAPY DIAG:  Chronic pain of right knee  Chronic pain of left knee  Muscle weakness (generalized)  Other low back pain  Rationale for Evaluation and Treatment: Rehabilitation  ONSET DATE: progressing over the last several months  SUBJECTIVE:   SUBJECTIVE STATEMENT: Pt arriving reporting bil knee pain. Pt also reporting low back pain. Pt with history of bilateral TKA. Pt stating she wants to get back to walking for exercise but pain is limiting her.   PERTINENT HISTORY: Lt TKA 06/06/20, Rt TKA 09/19/17, HTN, Aortic valve sclerosis, asthma, bilateral carotid bruits, HA, CPAP, shingles, pre-diabetes PAIN:  NPRS scale: 3/10 in bilateral knees Pain location:  knee, lumbar spine Pain description: achy, cramping Aggravating factors: walking, bending Relieving factors: tylenol pm  PRECAUTIONS: None  WEIGHT BEARING RESTRICTIONS: No  FALLS:  Has patient fallen in last 6 months? No  LIVING ENVIRONMENT: Lives with: lives with their family and lives alone Lives in: House/apartment Stairs: No Has following equipment at home: None  OCCUPATION: retired, works some at [employer]  part-time  PLOF: Independent  PATIENT GOALS: walk  Next MD visit:   OBJECTIVE:   DIAGNOSTIC FINDINGS:  Stable total knee replacement without complication  MRI scheduled for lumbar spine  PATIENT SURVEYS:  09/18/22: FOTO intake: 52      COGNITION: Overall cognitive status: WFL    SENSATION: WFL   MUSCLE LENGTH:   POSTURE: rounded shoulders, forward head, and decreased lumbar lordosis  PALPATION: Medial joint line tenderness on the Rt knee.  LOWER EXTREMITY MMT   ROM Right 09/18/22 Left 09/18/22  Hip flexion 3+ 3+  Hip extension    Hip abduction 3+ 3+  Hip adduction 3+ 3+  Hip internal rotation    Hip external rotation    Knee flexion 3+ 3+  Knee extension 4- 3+  Ankle dorsiflexion    Ankle plantarflexion    Ankle inversion    Ankle eversion     (Blank rows = not tested)  LOWER EXTREMITY ROM:  ROM Right 09/18/22 Left 09/18/22  Hip flexion    Hip extension    Hip abduction    Hip adduction    Hip internal rotation    Hip external rotation    Knee flexion 120 112 c pain  Knee extension 0 0  Ankle dorsiflexion    Ankle plantarflexion    Ankle inversion    Ankle eversion     (Blank rows = not tested)  LOWER EXTREMITY SPECIAL TESTS:    FUNCTIONAL TESTS:  09/18/22: 5 time sit to stand: 22 seconds c UE support  GAIT: Distance walked: 25 feet  Assistive device utilized: None Level of assistance: Complete Independence Comments: wide BOS, decreased heel strike bilaterally   TODAY'S TREATMENT                                                                          DATE: 09/18/22:  Therex:    HEP instruction/performance c cues for techniques, handout provided.  Trial set performed of each for comprehension and symptom assessment.  See below for exercise list  PATIENT EDUCATION:  Education details: HEP, POC Person educated: Patient Education method: Explanation, Demonstration, Verbal cues, and Handouts Education comprehension: verbalized understanding,  returned demonstration, and verbal cues required  HOME EXERCISE PROGRAM: Access Code: [email or web address] URL: [email or web address] Date: 09/18/2022 Prepared by: Narda Amber  Exercises - Supine Bridge  - 2 x daily - 7 x weekly - 15 reps - 5 seconds hold - Supine Lower Trunk Rotation  - 2 x daily - 7 x weekly - 3 reps - 20 seconds hold - Sit to Stand with Counter Support  - 2 x daily - 7 x weekly - 10 reps - Seated Hamstring Stretch  - 2 x daily - 7 x weekly - 3 reps - 20 seconds hold  ASSESSMENT:  CLINICAL IMPRESSION: Patient is a 77 y.o. who comes to  clinic with complaints of bilateral knee pain with history of bilateral TKA who also presents with low back pain. Pt presents with mobility, strength and movement coordination deficits that impair their ability to perform usual daily and recreational functional activities without increase difficulty/symptoms at this time.  Patient to benefit from skilled PT services to address impairments and limitations to improve to previous level of function without restriction secondary to condition.   OBJECTIVE IMPAIRMENTS: decreased mobility, difficulty walking, decreased ROM, decreased strength, impaired flexibility, and pain.   ACTIVITY LIMITATIONS: lifting, bending, standing, squatting, sleeping, stairs, and transfers  PARTICIPATION LIMITATIONS: cleaning, laundry, shopping, and community activity  PERSONAL FACTORS: Age and 3+ comorbidities: see pertinent history  are also affecting patient's functional outcome.   REHAB POTENTIAL: Good  CLINICAL DECISION MAKING: Evolving/moderate complexity  EVALUATION COMPLEXITY: Moderate   GOALS: Goals reviewed with patient? Yes  SHORT TERM GOALS: (target date for Short term goals are 3 weeks 10/12/22)   1.  Patient will demonstrate independent use of home exercise program to maintain progress from in clinic treatments.  Goal status: New  LONG TERM GOALS: (target dates for all long term goals  are 8 weeks  11/18/22 )   1. Patient will demonstrate/report pain at worst less than or equal to 2/10 to facilitate minimal limitation in daily activity secondary to pain symptoms.  Goal status: New   2. Patient will demonstrate independent use of home exercise program to facilitate ability to maintain/progress functional gains from skilled physical therapy services.  Goal status: New   3. Patient will demonstrate FOTO outcome > or = 58 % to indicate reduced disability due to condition.  Goal status: New   4.  Patient will demonstrate bilateral  LE MMT >/= 4/5 throughout to faciltiate usual transfers, stairs, squatting at [facility] for daily life.   Goal status: New   5.  Patient will be able to walk >/= 20 minutes with pain </= 2/10  in bilateral knees.    Goal status: New   6.  Pt will be able to navigate curb step without difficulty for community amb with no device.  Goal status: New      PLAN:  PT FREQUENCY: 1-2x/week  PT DURATION: 8 weeks  PLANNED INTERVENTIONS: Therapeutic exercises, Therapeutic activity, Neuro Muscular re-education, Balance training, Gait training, Patient/Family education, Joint mobilization, Stair training, DME instructions, Dry Needling, Electrical stimulation, Traction, Cryotherapy, vasopneumatic deviceMoist heat, Taping, Ultrasound, Ionotophoresis 4mg /ml Dexamethasone, and Manual therapy.  All included unless contraindicated  PLAN FOR NEXT SESSION: Review HEP knowledge/results, Nustep, LE strengthening, further assess lumbar spine, discuss pool option   Sharmon Leyden, PT, MPT 09/18/2022, 2:26 PM

## 2022-09-18 ENCOUNTER — Encounter: Payer: Self-pay | Admitting: Cardiology

## 2022-09-18 ENCOUNTER — Encounter: Payer: Self-pay | Admitting: Physical Therapy

## 2022-09-18 ENCOUNTER — Other Ambulatory Visit: Payer: Self-pay

## 2022-09-18 ENCOUNTER — Ambulatory Visit: Payer: No Typology Code available for payment source | Admitting: Cardiology

## 2022-09-18 ENCOUNTER — Ambulatory Visit (INDEPENDENT_AMBULATORY_CARE_PROVIDER_SITE_OTHER): Payer: Medicare Other | Admitting: Physical Therapy

## 2022-09-18 VITALS — BP 133/78 | HR 66 | Resp 17 | Ht 66.0 in | Wt 203.0 lb

## 2022-09-18 DIAGNOSIS — M25561 Pain in right knee: Secondary | ICD-10-CM

## 2022-09-18 DIAGNOSIS — E6609 Other obesity due to excess calories: Secondary | ICD-10-CM

## 2022-09-18 DIAGNOSIS — M25562 Pain in left knee: Secondary | ICD-10-CM | POA: Diagnosis not present

## 2022-09-18 DIAGNOSIS — G8929 Other chronic pain: Secondary | ICD-10-CM | POA: Diagnosis not present

## 2022-09-18 DIAGNOSIS — M6281 Muscle weakness (generalized): Secondary | ICD-10-CM | POA: Diagnosis not present

## 2022-09-18 DIAGNOSIS — I10 Essential (primary) hypertension: Secondary | ICD-10-CM

## 2022-09-18 DIAGNOSIS — E782 Mixed hyperlipidemia: Secondary | ICD-10-CM | POA: Diagnosis not present

## 2022-09-18 DIAGNOSIS — M5459 Other low back pain: Secondary | ICD-10-CM

## 2022-09-18 DIAGNOSIS — E119 Type 2 diabetes mellitus without complications: Secondary | ICD-10-CM

## 2022-09-18 DIAGNOSIS — Z6832 Body mass index (BMI) 32.0-32.9, adult: Secondary | ICD-10-CM | POA: Diagnosis not present

## 2022-09-18 MED ORDER — ROSUVASTATIN CALCIUM 20 MG PO TABS: 20.0000 mg | ORAL_TABLET | Freq: Every day | ORAL | 0 refills | Status: DC

## 2022-09-18 MED ORDER — LOSARTAN POTASSIUM 100 MG PO TABS: 100.0000 mg | ORAL_TABLET | Freq: Every day | ORAL | 3 refills | Status: DC

## 2022-09-18 NOTE — Progress Notes (Signed)
Rachel Crane Date of Birth: 08-Jan-1946 MRN: [id number] Primary Care Provider:Varadarajan, Ronie Spies, MD Former Cardiology Providers: Jeri Lager, APRN, FNP-C Primary Cardiologist: Rex Kras, DO, [facility] (established care 04/26/2020)  Date: 09/18/22 Last Office Visit: 06/30/2021  Chief Complaint  Patient presents with   Hypertension   Follow-up    1 year    HPI  Rachel Crane is a 77 y.o.  female whose past medical history and cardiovascular risk factors include: Hypertension, Hyperlipidemia, diabetes, sleep apnea currently on CPAP, postmenopausal female, advanced age.  Initially referred to the practice for preoperative risk stratification back in 2021 and since then has been following pretty much annually for follow-up given her age and cardiovascular risk factors.  She presents today for 1 year follow-up visit. She denies anginal chest pain or shortness of breath at rest or with effort related activities.  She currently walks 6 blocks at least 3 times a week and feels well.  Her home blood pressures are usually less than 130 mmHg.  She takes all of her blood pressure medications the morning and during midday feels tired fatigue and decreased energy.  ALLERGIES: Allergies  Allergen Reactions   Hydrocodone-Acetaminophen     Hives and nausea   MEDICATION LIST PRIOR TO VISIT: Current Outpatient Medications on File Prior to Visit  Medication Sig Dispense Refill   albuterol (VENTOLIN HFA) 108 (90 Base) MCG/ACT inhaler Inhale 2 puffs into the lungs every 4 (four) hours as needed for wheezing or shortness of breath (coughing fits). 36 g 1   chlorthalidone (HYGROTON) 25 MG tablet Take 25 mg by mouth daily.      Dermatological Products, Misc. [facility]) lotion Apply twice a day to the body as a moisturizer 225 g 3   fluticasone (FLONASE) 50 MCG/ACT nasal spray Place 2 sprays into both nostrils daily as needed (nasal congestion). 48 g 2   gabapentin (NEURONTIN) 600 MG tablet Take 1  tablet (600 mg total) by mouth 3 (three) times daily as needed. 90 tablet 0   JARDIANCE 10 MG TABS tablet Take 10 mg by mouth daily.     loratadine (CLARITIN) 10 MG tablet Take 1 tablet (10 mg total) by mouth daily. 30 tablet 5   Potassium Chloride ER 20 MEQ TBCR Take 1 tablet by mouth daily.     No current facility-administered medications on file prior to visit.    PAST MEDICAL HISTORY: Past Medical History:  Diagnosis Date   Abnormal stress ECG 10/06/2018   Aortic valve sclerosis 10/06/2018   Arthritis    Asthma    Bilateral carotid bruits 10/06/2018   Headache    H/O bad HA, since using CPAP- she has had improvement with that issue    Hypertension    Laboratory examination 10/06/2018   OSA on CPAP    settting - 8, uses CPAP q night    Pre-diabetes    Shingles     PAST SURGICAL HISTORY: Past Surgical History:  Procedure Laterality Date   CHOLECYSTECTOMY  1998   TOTAL KNEE ARTHROPLASTY Right 09/19/2017   Procedure: RIGHT TOTAL KNEE ARTHROPLASTY;  Surgeon: Leandrew Koyanagi, MD;  Location: [place];  Service: Orthopedics;  Laterality: Right;   TOTAL KNEE ARTHROPLASTY Left 06/06/2020   Procedure: LEFT TOTAL KNEE ARTHROPLASTY;  Surgeon: Leandrew Koyanagi, MD;  Location: [place];  Service: Orthopedics;  Laterality: Left;   TUBAL LIGATION      FAMILY HISTORY: The patient's family history includes Arthritis in her mother; Breast cancer in her sister;  Hypertension in her mother.   SOCIAL HISTORY:  The patient  reports that she has never smoked. She has never used smokeless tobacco. She reports that she does not currently use alcohol. She reports that she does not use drugs.  Review of Systems  Constitutional: Negative for chills and fever.  HENT:  Negative for hoarse voice and nosebleeds.   Eyes:  Negative for discharge, double vision and pain.  Cardiovascular:  Negative for chest pain, claudication, dyspnea on exertion, leg swelling, near-syncope, orthopnea, palpitations, paroxysmal nocturnal  dyspnea and syncope.  Respiratory:  Negative for hemoptysis and shortness of breath.   Musculoskeletal:  Positive for back pain and joint pain. Negative for muscle cramps and myalgias.  Gastrointestinal:  Negative for abdominal pain, constipation, diarrhea, hematemesis, hematochezia, melena, nausea and vomiting.  Neurological:  Negative for dizziness and light-headedness.    PHYSICAL EXAM:    09/18/2022    4:17 PM 09/18/2022    3:32 PM 09/03/2022    9:12 AM  Vitals with BMI  Height  '5\' 6"'$    Weight  203 lbs   BMI  [id number]   Systolic Q000111Q Q000111Q Q000111Q  Diastolic 78 66 83  Pulse 66 82 83   Physical Exam  Constitutional: No distress.  Age appropriate, hemodynamically stable.   Neck: No JVD present.  Cardiovascular: Normal rate, regular rhythm, S1 normal, S2 normal, intact distal pulses and normal pulses. Exam reveals no gallop, no S3 and no S4.  No murmur heard. Pulses:      Dorsalis pedis pulses are 2+ on the right side and 2+ on the left side.       Posterior tibial pulses are 2+ on the right side and 2+ on the left side.  Pulmonary/Chest: Effort normal and breath sounds normal. No stridor. She has no wheezes. She has no rales.  Abdominal: Soft. Bowel sounds are normal. She exhibits no distension. There is no abdominal tenderness.  Musculoskeletal:        General: No edema.     Cervical back: Neck supple.  Neurological: She is alert and oriented to person, place, and time. She has intact cranial nerves (2-12).  Skin: Skin is warm and moist.   CARDIAC DATABASE: EKG: 09/18/2022: Sinus rhythm, 63 bpm, nonspecific T wave abnormality  Echocardiogram:  01/19/2017: LVEF 0000000, grade 2 diastolic impairment, moderate LVH, trace MR, trace TR.  04/28/2020: Left ventricle cavity is normal in size. Moderate concentric hypertrophy of the left ventricle. Normal global wall motion. Normal LV systolic function with EF 64%. Doppler evidence of grade I (impaired) diastolic dysfunction, normal LAP. Left atrial  cavity is mildly dilated. Aneurysmal interatrial septum without 2D or color Doppler evidence of interatrial shunt. Mild tricuspid regurgitation. Peak RA-RV gradient 17 mmHg. The IVC is not well visualized. No significant change compared to previous study in 2018.    Stress Testing:  Lexiscan Tetrofosmin stress test 05/02/2020: Lexiscan nuclear stress test performed using 1-day protocol. SPECT images show large areas of breast attenuation, with imaging performed in sitting position. There is small area of mild intensity decrease in tracer uptake, in basal inferolateral myocardium, more prominent on stress images. While attenuation artifact is likely, small area of ischemia in this region cannot be excluded.  Stress LVEF 76%. Low risk study.   Heart Catheterization: None  LABORATORY DATA: RESULTS Component Value Reference Range Notes  Hemoglobin A1c Reviewed date:08/23/2022 01:17:09 PM Interpretation:High Performing Lab: Notes/Report: Testing Performed at: [facility], [address], [address], [place], [place] [address]  eAG  163      Hgb A1c 7.3 4.8-5.6 % Prediabetes: 5.7-6.4%  Diabetes: >/= [id number]   Basic Metabolic Reviewed [id number] 11:55:32 AM Interpretation:High Performing Lab: Notes/Report: Testing Performed at: [facility], [address], [address], [place], [place] [address]  Glucose 135 70-99 mg/dL    BUN 17 6-26 mg/dL    Creatinine 1.02 0.60-1.30 mg/dl    [id number] >60 calc In accordance with recommendations from NKF-ASN Task Force, [facility] has updated its eGFR calc to the 2021 CKD-EDI equation that estimates kidney function without a race variable;Stage 1 > 90 ML/Min plus Albuminuria;Stage 2 60-89 ML/MIN;Stage 3 30-59 ML/MIN;Stage 4 15-29 ML/MIN;Stage 5 <15 ML/MIN  Sodium 141 136-145 mmol/L    Potassium 3.9 3.5-5.5 mmol/L    Chloride 102 98-107 mmol/L    CO2 33 22-32 mmol/L    Anion Gap 9.7 6.0-20.0 mmol/L    Calcium 9.4 8.6-10.3 mg/dL    Albumin 4.1 3.4-4.8  g/dL    CA-corrected 9.29 8.60-10.30 mg/dL     Lipid Panel w/reflex Reviewed date:02/06/2022 02:31:34 PM Interpretation:Normal Performing Lab: Notes/Report: Testing Performed at: [facility], [address], [place], [place] [address]  Cholesterol 141 <200 mg/dL    CHOL/HDL 3.3 2.0-4.0 Ratio    HDLD 42 30-85 mg/dL Values below 40 mg/dL indicate increased risk factor  Triglyceride 82 0-199 mg/dL    NHDL 99 0-129 mg/dL Range dependent upon risk factors.  LDL Chol Calc (NIH) 83 0-99 mg/dL      IMPRESSION:    ICD-10-CM   1. Essential hypertension  I10 EKG 12-Lead    PCV ECHOCARDIOGRAM COMPLETE    losartan (COZAAR) 100 MG tablet    2. Mixed hyperlipidemia  E78.2 EKG 12-Lead    losartan (COZAAR) 100 MG tablet    rosuvastatin (CRESTOR) 20 MG tablet    Lipid Panel With LDL/HDL Ratio    LDL cholesterol, direct    CMP14+EGFR    3. Non-insulin dependent type 2 diabetes mellitus ([place])  E11.9     4. Class 1 obesity due to excess calories without serious comorbidity with body mass index (BMI) of 32.0 to 32.9 in adult  E66.09    Z68.32        RECOMMENDATIONS: Rachel Crane is a 77 y.o. female whose past medical history and cardiovascular risk factors include: Hypertension, Hyperlipidemia, diabetes, sleep apnea currently on CPAP, postmenopausal female, advanced age.  Patient presents today for 1 year follow-up visit.  She denies anginal discomfort or heart failure symptoms.  No hospitalizations or urgent care visits for cardiovascular reasons since last office encounter.  Office blood pressures are within acceptable range.  However, she takes both chlorthalidone and losartan in the morning which resulted in soft blood pressures during the daytime causing her to feel tired and fatigue.  Have asked her to take chlorthalidone in the morning and losartan at night which should help alleviate this feeling of midday tired/fatigue.  Outside labs from February 2024 independently  reviewed from care everywhere.  Given the fact that she is a diabetic her LDL is currently not at goal.  Recommend discontinuation of pravastatin and will transition to Crestor.  After she is on Crestor for 6 weeks she will have repeat fasting lipids.  I will order them for [facility].  If she gets them done at her PCPs office she is advised to call us so that we can follow-up and to have a copy sent to [place] for reference.  Repeat echocardiogram as a 3-year follow-up prior to  next yearly office visit.  FINAL MEDICATION LIST END OF ENCOUNTER: Meds ordered this encounter  Medications   losartan (COZAAR) 100 MG tablet    Sig: Take 1 tablet (100 mg total) by mouth daily at 10 pm.    Dispense:  30 tablet    Refill:  3   rosuvastatin (CRESTOR) 20 MG tablet    Sig: Take 1 tablet (20 mg total) by mouth at bedtime.    Dispense:  90 tablet    Refill:  0     Current Outpatient Medications:    albuterol (VENTOLIN HFA) 108 (90 Base) MCG/ACT inhaler, Inhale 2 puffs into the lungs every 4 (four) hours as needed for wheezing or shortness of breath (coughing fits)., Disp: 36 g, Rfl: 1   chlorthalidone (HYGROTON) 25 MG tablet, Take 25 mg by mouth daily. , Disp: , Rfl:    Dermatological Products, Misc. [facility]) lotion, Apply twice a day to the body as a moisturizer, Disp: 225 g, Rfl: 3   fluticasone (FLONASE) 50 MCG/ACT nasal spray, Place 2 sprays into both nostrils daily as needed (nasal congestion)., Disp: 48 g, Rfl: 2   gabapentin (NEURONTIN) 600 MG tablet, Take 1 tablet (600 mg total) by mouth 3 (three) times daily as needed., Disp: 90 tablet, Rfl: 0   JARDIANCE 10 MG TABS tablet, Take 10 mg by mouth daily., Disp: , Rfl:    loratadine (CLARITIN) 10 MG tablet, Take 1 tablet (10 mg total) by mouth daily., Disp: 30 tablet, Rfl: 5   Potassium Chloride ER 20 MEQ TBCR, Take 1 tablet by mouth daily., Disp: , Rfl:    rosuvastatin (CRESTOR) 20 MG tablet, Take 1 tablet (20 mg total) by mouth at bedtime., Disp: 90  tablet, Rfl: 0   losartan (COZAAR) 100 MG tablet, Take 1 tablet (100 mg total) by mouth daily at 10 pm., Disp: 30 tablet, Rfl: 3  Orders Placed This Encounter  Procedures   Lipid Panel With LDL/HDL Ratio   LDL cholesterol, direct   CMP14+EGFR   EKG 12-Lead   PCV ECHOCARDIOGRAM COMPLETE   --Continue cardiac medications as reconciled in final medication list. --Return in about 1 year (around 09/18/2023) for Annual follow up visit. Or sooner if needed. --Continue follow-up with your primary care physician regarding the management of your other chronic comorbid conditions.  Patient's questions and concerns were addressed to her satisfaction. She voices understanding of the instructions provided during this encounter.   This note was created using a voice recognition software as a result there may be grammatical errors inadvertently enclosed that do not reflect the nature of this encounter. Every attempt is made to correct such errors.  Rex Kras, [place], [facility]  Pager: [phone] Office: [phone]

## 2022-09-18 NOTE — Patient Instructions (Signed)
Stop pravastatin.  Start Crestor and in 6 weeks get fasting labs either at [facility] or with Dr. Clayton Bibles.  If he had labs with Dr. Clayton Bibles please send [place] a copy for reference.  Take losartan at night.

## 2022-09-25 ENCOUNTER — Encounter: Payer: Medicare Other | Admitting: Physical Therapy

## 2022-09-25 NOTE — Therapy (Addendum)
OUTPATIENT PHYSICAL THERAPY TREATMENT/ DISCHARGE   Patient Name: Rachel Crane MRN: [id number] DOB:1946/04/14, 77 y.o., female Today's Date: 09/27/2022  END OF SESSION:  PT End of Session - 09/27/22 1334     Visit Number 2    Number of Visits 16    Date for PT Re-Evaluation 11/16/22    PT Start Time 1330    PT Stop Time 1400    PT Time Calculation (min) 30 min    Activity Tolerance Patient tolerated treatment well    Behavior During Therapy [facility] for tasks assessed/performed              Past Medical History:  Diagnosis Date   Abnormal stress ECG 10/06/2018   Aortic valve sclerosis 10/06/2018   Arthritis    Asthma    Bilateral carotid bruits 10/06/2018   Headache    H/O bad HA, since using CPAP- she has had improvement with that issue    Hypertension    Laboratory examination 10/06/2018   OSA on CPAP    settting - 8, uses CPAP q night    Pre-diabetes    Shingles    Past Surgical History:  Procedure Laterality Date   CHOLECYSTECTOMY  1998   TOTAL KNEE ARTHROPLASTY Right 09/19/2017   Procedure: RIGHT TOTAL KNEE ARTHROPLASTY;  Surgeon: Tarry Kos, MD;  Location: [facility] OR;  Service: Orthopedics;  Laterality: Right;   TOTAL KNEE ARTHROPLASTY Left 06/06/2020   Procedure: LEFT TOTAL KNEE ARTHROPLASTY;  Surgeon: Tarry Kos, MD;  Location: [facility] OR;  Service: Orthopedics;  Laterality: Left;   TUBAL LIGATION     Patient Active Problem List   Diagnosis Date Noted   Status post total right knee replacement 02/15/2022   Chronic low back pain 02/15/2022   Moderate persistent asthma without complication 11/03/2021   Gastroesophageal reflux disease with esophagitis 11/03/2021   Status post total left knee replacement 06/06/2020   Positive ANA (antinuclear antibody) 04/25/2020   Primary osteoarthritis of left knee 04/14/2020   Lumbar radiculopathy 06/26/2019   Left-sided low back pain with left-sided sciatica 06/26/2019   Thoracic spinal stenosis 04/18/2019   Seasonal allergies  12/04/2018   Seasonal and perennial allergic rhinoconjunctivitis 10/10/2018   Moderate persistent asthma with acute exacerbation 10/10/2018   Pruritus 10/10/2018   Aortic valve sclerosis 10/06/2018   Bilateral carotid bruits 10/06/2018   Abnormal stress ECG 10/06/2018   Laboratory examination 10/06/2018   Palpitations 10/06/2018   Total knee replacement status 09/19/2017   Sprain of anterior talofibular ligament of right ankle 12/13/2016    PCP: Lorenda Ishihara, MD   REFERRING PROVIDER:  Tarry Kos, MD   REFERRING DIAG: [phone] (ICD-10-CM) - Status post total left knee replacement Z96.651 (ICD-10-CM) - Status post total right knee replacement M54.50,G89.29 (ICD-10-CM) - Chronic low back pain, unspecified back pain laterality, unspecified whether sciatica present  THERAPY DIAG:  Chronic pain of right knee  Chronic pain of left knee  Other low back pain  Muscle weakness (generalized)  Rationale for Evaluation and Treatment: Rehabilitation  ONSET DATE: progressing over the last several months  SUBJECTIVE:   SUBJECTIVE STATEMENT:  Pt denies any pain today, but reports more weakness in bilat knees.   Eval: Pt arriving reporting bil knee pain. Pt also reporting low back pain. Pt with history of bilateral TKA. Pt stating she wants to get back to walking for exercise but pain is limiting her.   PERTINENT HISTORY: Lt TKA 06/06/20, Rt TKA 09/19/17, HTN, Aortic valve sclerosis, asthma, bilateral  carotid bruits, HA, CPAP, shingles, pre-diabetes PAIN:  NPRS scale: 3/10 in bilateral knees Pain location: knee, lumbar spine Pain description: achy, cramping Aggravating factors: walking, bending Relieving factors: tylenol pm  PRECAUTIONS: None  WEIGHT BEARING RESTRICTIONS: No  FALLS:  Has patient fallen in last 6 months? No  LIVING ENVIRONMENT: Lives with: lives with their family and lives alone Lives in: House/apartment Stairs: No Has following equipment at home:  None  OCCUPATION: retired, works some at [employer] part-time  PLOF: Independent  PATIENT GOALS: walk  Next MD visit:   OBJECTIVE:   DIAGNOSTIC FINDINGS:  Stable total knee replacement without complication  MRI scheduled for lumbar spine  PATIENT SURVEYS:  09/18/22: FOTO intake: 52      COGNITION: Overall cognitive status: WFL    SENSATION: WFL   MUSCLE LENGTH:   POSTURE: rounded shoulders, forward head, and decreased lumbar lordosis  PALPATION: Medial joint line tenderness on the Rt knee.  LOWER EXTREMITY MMT   ROM Right 09/18/22 Left 09/18/22  Hip flexion 3+ 3+  Hip extension    Hip abduction 3+ 3+  Hip adduction 3+ 3+  Hip internal rotation    Hip external rotation    Knee flexion 3+ 3+  Knee extension 4- 3+  Ankle dorsiflexion    Ankle plantarflexion    Ankle inversion    Ankle eversion     (Blank rows = not tested)  LOWER EXTREMITY ROM:  ROM Right 09/18/22 Left 09/18/22  Hip flexion    Hip extension    Hip abduction    Hip adduction    Hip internal rotation    Hip external rotation    Knee flexion 120 112 c pain  Knee extension 0 0  Ankle dorsiflexion    Ankle plantarflexion    Ankle inversion    Ankle eversion     (Blank rows = not tested)  LOWER EXTREMITY SPECIAL TESTS:    FUNCTIONAL TESTS:  09/18/22: 5 time sit to stand: 22 seconds c UE support  GAIT: Distance walked: 25 feet  Assistive device utilized: None Level of assistance: Complete Independence Comments: wide BOS, decreased heel strike bilaterally   TODAY'S TREATMENT                                                                          DATE: 09/27/2022: Therex:  Nustep lvl 5, 5 min Supine bridges 2x10  Supine LTR x 30  LAQ 2x10, bilat  Sit to stand from high/low mat 1x5  Seated HS stretch 2x30 sec   09/18/22:  Therex:    HEP instruction/performance c cues for techniques, handout provided.  Trial set performed of each for comprehension and symptom assessment.  See  below for exercise list  PATIENT EDUCATION:  Education details: HEP, POC Person educated: Patient Education method: Explanation, Demonstration, Verbal cues, and Handouts Education comprehension: verbalized understanding, returned demonstration, and verbal cues required  HOME EXERCISE PROGRAM: Access Code: [email or web address] URL: [email or web address] Date: 09/18/2022 Prepared by: Narda Amber  Exercises - Supine Bridge  - 2 x daily - 7 x weekly - 15 reps - 5 seconds hold - Supine Lower Trunk Rotation  - 2 x daily - 7 x weekly - 3 reps - 20 seconds hold -  Sit to Stand with Counter Support  - 2 x daily - 7 x weekly - 10 reps - Seated Hamstring Stretch  - 2 x daily - 7 x weekly - 3 reps - 20 seconds hold  ASSESSMENT:  CLINICAL IMPRESSION: Patient reports to first f/u appt late due to traffic. She denies any pain in her bilat knee's today, but reports weakness. Reviewed HEP with cues needed for all exercises due to pt performing them incorrectly. Session with focus on mobility and LE strengthening. Pt tolerated session well with no adverse effects. Pt will continue to benefit from skilled PT to address continued deficits.   OBJECTIVE IMPAIRMENTS: decreased mobility, difficulty walking, decreased ROM, decreased strength, impaired flexibility, and pain.   ACTIVITY LIMITATIONS: lifting, bending, standing, squatting, sleeping, stairs, and transfers  PARTICIPATION LIMITATIONS: cleaning, laundry, shopping, and community activity  PERSONAL FACTORS: Age and 3+ comorbidities: see pertinent history  are also affecting patient's functional outcome.   REHAB POTENTIAL: Good  CLINICAL DECISION MAKING: Evolving/moderate complexity  EVALUATION COMPLEXITY: Moderate   GOALS: Goals reviewed with patient? Yes  SHORT TERM GOALS: (target date for Short term goals are 3 weeks 10/12/22)   1.  Patient will demonstrate independent use of home exercise program to maintain progress from in clinic  treatments.  Goal status: New  LONG TERM GOALS: (target dates for all long term goals are 8 weeks  11/18/22 )   1. Patient will demonstrate/report pain at worst less than or equal to 2/10 to facilitate minimal limitation in daily activity secondary to pain symptoms.  Goal status: New   2. Patient will demonstrate independent use of home exercise program to facilitate ability to maintain/progress functional gains from skilled physical therapy services.  Goal status: New   3. Patient will demonstrate FOTO outcome > or = 58 % to indicate reduced disability due to condition.  Goal status: New   4.  Patient will demonstrate bilateral  LE MMT >/= 4/5 throughout to faciltiate usual transfers, stairs, squatting at [facility] for daily life.   Goal status: New   5.  Patient will be able to walk >/= 20 minutes with pain </= 2/10  in bilateral knees.    Goal status: New   6.  Pt will be able to navigate curb step without difficulty for community amb with no device.  Goal status: New      PLAN:  PT FREQUENCY: 1-2x/week  PT DURATION: 8 weeks  PLANNED INTERVENTIONS: Therapeutic exercises, Therapeutic activity, Neuro Muscular re-education, Balance training, Gait training, Patient/Family education, Joint mobilization, Stair training, DME instructions, Dry Needling, Electrical stimulation, Traction, Cryotherapy, vasopneumatic deviceMoist heat, Taping, Ultrasound, Ionotophoresis 4mg /ml Dexamethasone, and Manual therapy.  All included unless contraindicated  PLAN FOR NEXT SESSION: Review HEP knowledge/results, Nustep, LE strengthening, further assess lumbar spine, discuss pool option   Champ Mungo, PT, MPT 09/27/2022, 1:58 PM   PHYSICAL THERAPY DISCHARGE SUMMARY  Visits from Start of Care: 2  Current functional level related to goals / functional outcomes: See note   Remaining deficits: See note   Education / Equipment: HEP  Patient goals were partially met. Patient is being  discharged due to not returning since the last visit.   Chyrel Masson, PT, DPT, OCS, ATC 11/19/22  1:46 PM

## 2022-09-27 ENCOUNTER — Ambulatory Visit (INDEPENDENT_AMBULATORY_CARE_PROVIDER_SITE_OTHER): Payer: Medicare Other | Admitting: Physical Therapy

## 2022-09-27 ENCOUNTER — Encounter: Payer: Self-pay | Admitting: Physical Therapy

## 2022-09-27 DIAGNOSIS — M6281 Muscle weakness (generalized): Secondary | ICD-10-CM | POA: Diagnosis not present

## 2022-09-27 DIAGNOSIS — M5459 Other low back pain: Secondary | ICD-10-CM

## 2022-09-27 DIAGNOSIS — M25562 Pain in left knee: Secondary | ICD-10-CM

## 2022-09-27 DIAGNOSIS — M25561 Pain in right knee: Secondary | ICD-10-CM

## 2022-09-27 DIAGNOSIS — G8929 Other chronic pain: Secondary | ICD-10-CM

## 2022-10-03 ENCOUNTER — Encounter: Payer: Self-pay | Admitting: Orthopedic Surgery

## 2022-10-03 ENCOUNTER — Ambulatory Visit (INDEPENDENT_AMBULATORY_CARE_PROVIDER_SITE_OTHER): Payer: Medicare Other | Admitting: Orthopedic Surgery

## 2022-10-03 ENCOUNTER — Other Ambulatory Visit (INDEPENDENT_AMBULATORY_CARE_PROVIDER_SITE_OTHER): Payer: Medicare Other

## 2022-10-03 VITALS — BP 127/73 | HR 75 | Ht 66.0 in | Wt 203.0 lb

## 2022-10-03 DIAGNOSIS — G8929 Other chronic pain: Secondary | ICD-10-CM

## 2022-10-03 DIAGNOSIS — M545 Low back pain, unspecified: Secondary | ICD-10-CM | POA: Diagnosis not present

## 2022-10-03 MED ORDER — GABAPENTIN 300 MG PO CAPS: 300.0000 mg | ORAL_CAPSULE | Freq: Three times a day (TID) | ORAL | 2 refills | Status: DC

## 2022-10-03 NOTE — Progress Notes (Signed)
Orthopedic Spine Surgery Office Note  Assessment: Patient is a 77 y.o. female with low back pain that radiates into her bilateral lateral thighs.  Has spondylolisthesis at L3-4 and L4-5   Plan: -Explained that initially conservative treatment is tried as a significant number of patients may experience relief with these treatment modalities. Discussed that the conservative treatments include:  -activity modification  -physical therapy  -over the counter pain medications  -medrol dosepak  -lumbar steroid injections -Patient has tried PT, tylenol, gabapentin  -Recommended continuing gabapentin since it is helping her. Prescription provided to her today -Patient should return to office in 6 weeks, x-rays at next visit: none   Patient expressed understanding of the plan and all questions were answered to the patient's satisfaction.   ___________________________________________________________________________   History:  Patient is a 77 y.o. female who presents today for lumbar spine.  Patient has had over a year of low back pain that radiates into her bilateral lateral thighs.  It is more significant on the left side.  There is no trauma or injury that brought on the pain.  Pain does not radiate past the knees.  She sometimes gets numbness and tingling in the same distribution as the pain.  Pain is not activity related.  She has a randomly.  It is gotten better within the last couple of months and she feels that gabapentin is helping her significantly.  She said as long as she is taking gabapentin 3 times per day she is having minimal pain.   Weakness: Denies Symptoms of imbalance: Denies Paresthesias and numbness: Sometimes in the lateral aspect of the thighs.  No other numbness or paresthesias Bowel or bladder incontinence: Denies Saddle anesthesia: Denies  Treatments tried: PT, tylenol, gabapentin  Review of systems: Denies fevers and chills, night sweats, unexplained weight loss,  history of cancer, pain that wakes them at night  Past medical history: HLD HTN Osteoarthritis Asthma OSA  Allergies: NKDA  Past surgical history:  Bilateral TKA Cholecystectomy Tubal ligation  Social history: Denies use of nicotine product (smoking, vaping, patches, smokeless) Alcohol use: denies Denies recreational drug use   Physical Exam:  General: no acute distress, appears stated age Neurologic: alert, answering questions appropriately, following commands Respiratory: unlabored breathing on room air, symmetric chest rise Psychiatric: appropriate affect, normal cadence to speech   MSK (spine):  -Strength exam      Left  Right EHL    4/5  4/5 TA    5/5  5/5 GSC    5/5  5/5 Knee extension  5/5  5/5 Hip flexion   5/5  5/5  -Sensory exam    Sensation intact to light touch in L3-S1 nerve distributions of bilateral lower extremities  -Achilles DTR: 1/4 on the left, 1/4 on the right -Patellar tendon DTR: 2/4 on the left, 1/4 on the right  -Straight leg raise: negative bilaterally -Femoral nerve stretch test: negative bilaterally -Clonus: no beats bilaterally  -Left hip exam: no pain through range of motion, negative stinchfield, negative FABER -Right hip exam: no pain through range of motion, negative stinchfield, negative FABER  Imaging: XR of the lumbar spine from 10/03/2022 was independently reviewed and interpreted, showing spondylolisthesis at L3/4 and L4/5 - each level shifts less than 42mm between flexion and extension. Disc height loss at L4/5. No other significant degenerative findings. No fracture or dislocation.    Patient name: Rachel Crane Patient MRN: [id number] Date of visit: 10/03/22

## 2022-10-04 ENCOUNTER — Encounter: Payer: Medicare Other | Admitting: Rehabilitative and Restorative Service Providers"

## 2022-10-05 ENCOUNTER — Encounter: Payer: Medicare Other | Admitting: Physical Therapy

## 2022-10-15 ENCOUNTER — Ambulatory Visit
Admission: RE | Admit: 2022-10-15 | Discharge: 2022-10-15 | Disposition: A | Discharge status: Home / Self Care | Payer: Medicare Other | Source: Ambulatory Visit | Attending: Orthopedic Surgery | Admitting: Orthopedic Surgery

## 2022-10-15 ENCOUNTER — Telehealth: Payer: Self-pay | Admitting: Orthopedic Surgery

## 2022-10-15 DIAGNOSIS — M545 Low back pain, unspecified: Secondary | ICD-10-CM | POA: Diagnosis not present

## 2022-10-15 DIAGNOSIS — M47816 Spondylosis without myelopathy or radiculopathy, lumbar region: Secondary | ICD-10-CM | POA: Diagnosis not present

## 2022-10-15 DIAGNOSIS — M48061 Spinal stenosis, lumbar region without neurogenic claudication: Secondary | ICD-10-CM | POA: Diagnosis not present

## 2022-10-15 DIAGNOSIS — M4316 Spondylolisthesis, lumbar region: Secondary | ICD-10-CM | POA: Diagnosis not present

## 2022-10-15 MED ORDER — GABAPENTIN 300 MG PO CAPS: 300.0000 mg | ORAL_CAPSULE | Freq: Three times a day (TID) | ORAL | 2 refills | Status: DC

## 2022-10-15 NOTE — Telephone Encounter (Signed)
Patient called. Would like Dr. Laurance Flatten to call in Gabapentin for her,. Her call back number is [phone]

## 2022-10-18 ENCOUNTER — Encounter: Payer: Medicare Other | Admitting: Rehabilitative and Restorative Service Providers"

## 2022-11-05 ENCOUNTER — Telehealth: Payer: Self-pay | Admitting: Orthopedic Surgery

## 2022-11-05 MED ORDER — GABAPENTIN 300 MG PO CAPS: 600.0000 mg | ORAL_CAPSULE | Freq: Three times a day (TID) | ORAL | 2 refills | Status: AC

## 2022-11-05 MED ORDER — GABAPENTIN 300 MG PO CAPS: 300.0000 mg | ORAL_CAPSULE | Freq: Three times a day (TID) | ORAL | 2 refills | Status: DC

## 2022-11-05 NOTE — Telephone Encounter (Signed)
Patient states her gabapentin is supposed to be  instead of  and she is having to take double the amount to get relief and she is almost out please advise

## 2022-11-05 NOTE — Addendum Note (Signed)
Addended by: Willia Craze on: 11/05/2022 03:11 PM   Modules accepted: Orders

## 2022-11-05 NOTE — Telephone Encounter (Signed)
Patient states she want pain medication refilled, but doesn't know what the medication is. Please advise

## 2022-11-05 NOTE — Telephone Encounter (Signed)
Patient called needing a refill Gabapentin. Patient uses [facility] on [address]. The number to contact patient is [phone]

## 2022-11-14 ENCOUNTER — Ambulatory Visit (INDEPENDENT_AMBULATORY_CARE_PROVIDER_SITE_OTHER): Payer: Medicare Other | Admitting: Orthopedic Surgery

## 2022-11-14 DIAGNOSIS — M48062 Spinal stenosis, lumbar region with neurogenic claudication: Secondary | ICD-10-CM

## 2022-11-14 NOTE — Progress Notes (Signed)
Orthopedic Spine Surgery Office Note  Assessment: Patient is a 77 y.o. female with low back pain that radiates into her bilateral anterior thighs and lateral thighs.  It is worse with walking or upright posture and resolves with seated position consistent with neurogenic claudication. Her neurogenic claudication has gotten progressively worse. MRI shows stenosis at L2/3 and L3/4   Plan: -Patient has tried PT, tylenol, gabapentin, lumbar injections -Patient has tried conservative treatments without any relief, so discussed surgery as an option in the form of L2-4 laminectomies.  Patient did not want to make decision today and will come back in the office to discuss further with her daughter -Patient should return to office in 6 weeks, x-rays at next visit: None   Patient expressed understanding of the plan and all questions were answered to the patient's satisfaction.   ___________________________________________________________________________  History: Patient is a 77 y.o. female who has been previously seen in the office for symptoms of low back pain radiating into her bilateral legs.  Today, she says that the gabapentin is no longer providing her with significant relief.  She is having pain on a daily basis.  She notes that the pain is worse if she is upright or walking.  It does improve significantly if she sits down or flexes her lumbar spine.  She feels the pain radiating into her lateral thighs and anterior thighs.  On the right side, she has pain radiating down the anterior lateral leg as well.  She does not have any pain radiating past the knee on the left side.  Sometimes gets paresthesias and numbness in the same distribution as the pain.  Has not had any bowel or bladder incontinence.  No saddle anesthesia.  Previous treatments: PT, tylenol, gabapentin, lumbar injections  Physical Exam:  General: no acute distress, appears stated age Neurologic: alert, answering questions  appropriately, following commands Respiratory: unlabored breathing on room air, symmetric chest rise Psychiatric: appropriate affect, normal cadence to speech   MSK (spine):  -Strength exam      Left  Right EHL    4/5  4/5 TA    5/5  5/5 GSC    5/5  5/5 Knee extension  5/5  5/5 Hip flexion   5/5  5/5  -Sensory exam    Sensation intact to light touch in L3-S1 nerve distributions of bilateral lower extremities  -Achilles DTR: 1/4 on the left, 1/4 on the right -Patellar tendon DTR: 1/4 on the left, 1/4 on the right  -Straight leg raise: Negative bilaterally -Femoral nerve stretch test: Negative bilaterally -Clonus: no beats bilaterally -Palpable DP pulses bilaterally  -Left hip exam: no pain through range of motion, negative stinchfield, negative FABER -Right hip exam: no pain through range of motion, negative stinchfield, negative FABER  Imaging: XR of the lumbar spine from 10/03/2022 was previously independently reviewed and interpreted, showing spondylolisthesis at L3/4 and L4/5 - each level shifts less than 1mm between flexion and extension. Disc height loss at L4/5. No other significant degenerative findings. No fracture or dislocation.   MRI of the lumbar spine from 02/23/2022 was independently reviewed and interpreted, showing central lateral recess stenosis at L2/3.  Central lateral recess stenosis at L3/4.  Foraminal stenosis bilaterally at L2/3, L3/4, and L5/S1. Anterolisthesis at L3/4 and L4/5. T2 signal within the facet joint measures less than 1mm.   Patient name: Rachel Crane Patient MRN: [id number] Date of visit: 11/14/22

## 2022-11-29 ENCOUNTER — Ambulatory Visit (INDEPENDENT_AMBULATORY_CARE_PROVIDER_SITE_OTHER): Payer: Medicare Other | Admitting: Neurology

## 2022-11-29 ENCOUNTER — Encounter (HOSPITAL_COMMUNITY): Payer: Self-pay | Admitting: *Deleted

## 2022-11-29 ENCOUNTER — Encounter: Payer: Self-pay | Admitting: Neurology

## 2022-11-29 ENCOUNTER — Ambulatory Visit (HOSPITAL_COMMUNITY)
Admission: EM | Admit: 2022-11-29 | Discharge: 2022-11-29 | Disposition: A | Discharge status: Home / Self Care | Payer: Medicare Other | Attending: Urgent Care | Admitting: Urgent Care

## 2022-11-29 ENCOUNTER — Telehealth: Payer: Self-pay | Admitting: Orthopedic Surgery

## 2022-11-29 VITALS — BP 109/58 | HR 65 | Ht 66.0 in | Wt 202.0 lb

## 2022-11-29 DIAGNOSIS — H6592 Unspecified nonsuppurative otitis media, left ear: Secondary | ICD-10-CM | POA: Diagnosis not present

## 2022-11-29 DIAGNOSIS — H61892 Other specified disorders of left external ear: Secondary | ICD-10-CM | POA: Diagnosis not present

## 2022-11-29 DIAGNOSIS — H6123 Impacted cerumen, bilateral: Secondary | ICD-10-CM

## 2022-11-29 DIAGNOSIS — G4733 Obstructive sleep apnea (adult) (pediatric): Secondary | ICD-10-CM | POA: Diagnosis not present

## 2022-11-29 MED ORDER — METHOCARBAMOL 500 MG PO TABS: 500.0000 mg | ORAL_TABLET | Freq: Four times a day (QID) | ORAL | 0 refills | Status: AC

## 2022-11-29 MED ORDER — FLUTICASONE PROPIONATE 50 MCG/ACT NA SUSP: 1.0000 | Freq: Every day | NASAL | 0 refills | Status: AC | PRN

## 2022-11-29 MED ORDER — FLUTICASONE PROPIONATE 50 MCG/ACT NA SUSP: 1.0000 | Freq: Every day | NASAL | 0 refills | Status: DC | PRN

## 2022-11-29 MED ORDER — HYDROCORTISONE-ACETIC ACID 1-2 % OT SOLN: 3.0000 [drp] | Freq: Two times a day (BID) | OTIC | 0 refills | Status: DC

## 2022-11-29 NOTE — Telephone Encounter (Signed)
Patient came by she would like some pain medication called in. Her #is [phone]

## 2022-11-29 NOTE — Progress Notes (Signed)
Subjective:    Patient ID: Rachel Crane is a 77 y.o. female.  HPI    Interim history:     Rachel Crane is a 77 year old right-handed woman with an underlying medical history of hypertension, arthritis, and obesity, who presents for follow-up of her sleep apnea, on CPAP therapy. The patient is unaccompanied today.  I last saw her on 06/23/2019, at which time she was compliant with her CPAP and doing well.  She had flareup of low back pain and had seen Dr. Alvester Morin, she was recommended to have surgery but decided against it.  She was using a walker or cane.  She denied any recent headaches.    Today, 11/29/2022: I reviewed her CPAP compliance data from 09/04/2022 through 10/03/2022, which is a total of 30 days, during which time she used her machine every night with percent use days greater than 4 hours and 87%, indicating very good compliance, average usage of 6 hours and 22 minutes, residual AHI mildly elevated at 9.1 and has increased since last year, leak acceptable and with significant fluctuation, 95th percentile at 14.4 L/min on a pressure of 10 cm with EPR of 2.  Her set up date was 09/08/2022.  She has a [employer] air sense 11 AutoSet machine.  Her DME company is [employer].  She reports overall doing well with her new machine.  She does have some air leakage from time to time, feels like sometimes the mask is too tight and sometimes it is too loose, she does have to adjust the straps on it day-to-day basis.  She utilizes a medium F20 AirTouch fullface mask from [employer].  She is motivated to continue with treatment.  She feels that her sleep is good quality, Epworth sleepiness score is 1 out of 24.  She is up-to-date with her supplies but does have some significant mouth dryness at times.  She uses the distilled water.  When we plugged in the machine and looked at her settings, her humidifier is set for 4, we mutually agreed to set it to 5 at this time.  She stays active.  She has 2 daughters and  1 son, 1 daughter is a Optometrist, the other daughter is an Programmer, systems, school principal, and her son is a Armed forces operational officer.   The patient's allergies, current medications, family history, past medical history, past social history, past surgical history and problem list were reviewed and updated as appropriate.    Previously (copied from previous notes for reference):     She saw Ihor Austin, NP, on 06/21/2022, at which time she was suboptimal with her CPAP compliance, residual AHI was mildly elevated at 7.1.  She requested a new machine.  She saw Everlene Other, NP in a virtual visit on 06/27/2021, at which time she was compliant with her CPAP of 9 cm and was advised to continue with treatment, her AHI was borderline at 5.6 and her pressure was increased to 10 cm at the time.    She saw Everlene Other, NP, on 06/23/2020, at which time she was compliant with her CPAP and doing well.  She was advised to follow-up routinely in 1 year.  I saw her on 12/12/2017, at which time she was compliant with her CPAP.  She had no significant headaches.    She saw Everlene Other, nurse practitioner in the interim on 05/28/2018, at which time she was compliant with her CPAP and advised to follow-up routinely in 1 year.   She had an interim phone call  visit with nurse practitioner Amy Lomax, on 10/29/2018, at which time she had additional concerns including fatigue and difficulty falling asleep.  She had recently been seen in the emergency room.  She had blood work done.  She was advised to use melatonin at night and follow-up closely with her primary care physician and continue to be fully compliant with her CPAP.   I reviewed her CPAP compliance data from 05/23/2019 through 06/21/2019 which is a total of 30 days, during which time she used her machine every night with percent use days greater than 4 hours and 97%, indicating excellent compliance with an average usage of 8 hours and 0 minutes, residual AHI at goal at  2.7/h, leak on the low side with a 95th percentile at 2.2 L/min on a pressure of 8 cm with EPR of 2.     I saw her on 12/11/2016, at which time she was compliant with her CPAP. She reported going to bed late, around midnight. She did endorse improvement of her sleep quality and headaches were much improved. She was supposed to have a stress test due to symptoms of shortness of breath. Very good   She was seen in the interim on 06/12/2017 by Butch Penny, nurse practitioner, at which time she was compliant with treatment but her residual AHI was borderline at 5.9, her treatment pressure was therefore increased to 8 cm.    I reviewed her CPAP compliance data from 11/11/2017 through 12/10/2017 which is a total of 30 days, during which time she used her CPAP 29 days with percent used days greater than 4 hours at 97%, indicating excellent compliance with an average usage of 7 hours and 9 minutes, residual AHI at goal at 1.9 per hour, leak acceptable with the 95th percentile at 14.5 L/m on a pressure of 8 cm with EPR of 2.    I first met her on 09/06/2016 is a referral from the emergency room, at which time she reported bilateral recurrent headaches, also nocturnal headaches. Given her history of snoring, I suggested she proceed with a sleep study. She had a baseline sleep study, followed by a CPAP titration study. I went over her test results with her in detail today. Her baseline sleep study from 09/18/2016 showed a sleep efficiency of 81%, an increased percentage of stage II sleep, absence of slow-wave sleep and REM sleep was 20.8% with a normal REM latency. Total AHI was elevated at 40.5 per hour, average oxygen saturation was 96%, nadir was 81%. She had noticed significant PLMS. Based on her test results as well as her sleep-related complaints and recurrent headaches I suggested she return for a CPAP titration study, which she had on 10/09/2016. She was fitted with nasal pillows. Sleep efficiency was 55.6%,  sleep latency was 77.5 minutes, REM latency was 52.5 minutes. She had an increased percentage of slow-wave sleep and REM sleep was 18.3%. She was titrated from 5 cm of CPAP to 6 cm of CPAP, final AHI was 0 per hour, O2 nadir of 95%, supine REM sleep achieved. Based on her test results I prescribed CPAP therapy for home use.   I reviewed her CPAP compliance data from 10/29/2016 through 11/27/2016, which is a total of 30 days but technically she had only been on treatment for 28 days but was compliant with a compliance percentage of 70%, average usage of 5 hours and 36 minutes, residual AHI borderline at 6.6, leak acceptable with the 95th percentile at 16.8 L/m on a pressure  of 6 cm with EPR of 2.      09/06/2016: She presented to the emergency room on 08/30/2016 with a two-week history of severe headache which was primarily bilateral headache, also on the top of her head. I reviewed the emergency room records. She reported a sharp intermittent headache. It was worse with lying down and became throbbing. She had taken some ibuprofen over-the-counter. She did not report much in the way of photophobia. She had no nausea or vomiting or other neurological accompaniment and no history of trauma. She had a head CT without contrast on 08/30/2016 which I reviewed: No acute intracranial abnormalities. In addition, I first only reviewed the images through the PACS system. I did not detect any significant atrophy or white matter changes. She was treated symptomatically with Tylenol, naproxen, and she declined Reglan. She was discharged to home. She reports that she has not been able to sleep very well. She has intermittent, rare in fact episodes of sharp shooting pains that start in the right occipital area and lasts for only seconds at a time. In addition, she has woken up in the middle of the night with headaches that have been building up gradually and seemed to be worse when she does not sleep well. She reports  significant issues with her sleep since she moved here from [place]. She moved in early November to be closer to her children. She has 1 child in [place], 2 in [place]. She lives in her own home. She has adjusted to her new living environment but still has some anxiety and has not been able to sleep very well. She has a known history of snoring. She has woken herself up with a sense of gasping for air and foreign body sensation in her throat. She goes to bed sometimes as early as 8 PM, he watches TV in her bedroom but turns it off. She has a small dog that sometimes sleeps at the foot end but does not bother her. She lives alone. She is widowed for the past 7 years. Wakeup time varies. She is often up in the early morning hours and cannot go back to sleep. She has had more anxiety and even mild depression type symptoms lately. She is scheduled to see her new primary care provider on March 1. She does not drink caffeine daily, she drinks alcohol very occasionally and does not smoke. She tries to drink water and some juice. She has knee pain at times but denies frank restless leg type symptoms. She has significant nocturia about 3-4 times per average night. She tries to rest during the day but typically does not fall asleep. Epworth sleepiness score is 0 out of 24, fatigue score is 63 out of 63.  The patient's allergies, current medications, family history, past medical history, past social history, past surgical history and problem list were reviewed and updated as appropriate.    Her Past Medical History Is Significant For: Past Medical History:  Diagnosis Date   Abnormal stress ECG 10/06/2018   Aortic valve sclerosis 10/06/2018   Arthritis    Asthma    Bilateral carotid bruits 10/06/2018   Headache    H/O bad HA, since using CPAP- she has had improvement with that issue    Hypertension    Laboratory examination 10/06/2018   OSA on CPAP    settting - 8, uses CPAP q night    Pre-diabetes    Shingles      Her Past Surgical History Is  Significant For: Past Surgical History:  Procedure Laterality Date   CHOLECYSTECTOMY  1998   TOTAL KNEE ARTHROPLASTY Right 09/19/2017   Procedure: RIGHT TOTAL KNEE ARTHROPLASTY;  Surgeon: Tarry Kos, MD;  Location: [facility] OR;  Service: Orthopedics;  Laterality: Right;   TOTAL KNEE ARTHROPLASTY Left 06/06/2020   Procedure: LEFT TOTAL KNEE ARTHROPLASTY;  Surgeon: Tarry Kos, MD;  Location: [facility] OR;  Service: Orthopedics;  Laterality: Left;   TUBAL LIGATION      Her Family History Is Significant For: Family History  Problem Relation Age of Onset   Hypertension Mother    Arthritis Mother    Breast cancer Sister    Sleep apnea Neg Hx     Her Social History Is Significant For: Social History   Socioeconomic History   Marital status: Widowed    Spouse name: Not on file   Number of children: 3   Years of education: Not on file   Highest education level: Not on file  Occupational History   Not on file  Tobacco Use   Smoking status: Never   Smokeless tobacco: Never  Vaping Use   Vaping Use: Never used  Substance and Sexual Activity   Alcohol use: Not Currently   Drug use: No   Sexual activity: Not on file  Other Topics Concern   Not on file  Social History Narrative   Not on file   Social Determinants of Health   Financial Resource Strain: Not on file  Food Insecurity: Not on file  Transportation Needs: Not on file  Physical Activity: Not on file  Stress: Not on file  Social Connections: Not on file    Her Allergies Are:  Allergies  Allergen Reactions   Hydrocodone-Acetaminophen     Hives and nausea  :   Her Current Medications Are:  Outpatient Encounter Medications as of 11/29/2022  Medication Sig   albuterol (VENTOLIN HFA) 108 (90 Base) MCG/ACT inhaler Inhale 2 puffs into the lungs every 4 (four) hours as needed for wheezing or shortness of breath (coughing fits). (Patient taking differently: Inhale 2 puffs into the lungs as  needed for wheezing or shortness of breath (coughing fits).)   chlorthalidone (HYGROTON) 25 MG tablet Take 25 mg by mouth daily.    Dermatological Products, Misc. [facility]) lotion Apply twice a day to the body as a moisturizer   fluticasone (FLONASE) 50 MCG/ACT nasal spray Place 2 sprays into both nostrils daily as needed (nasal congestion). (Patient taking differently: Place 2 sprays into both nostrils as needed (nasal congestion).)   gabapentin (NEURONTIN) 300 MG capsule Take 2 capsules (600 mg total) by mouth 3 (three) times daily.   JARDIANCE 10 MG TABS tablet Take 10 mg by mouth daily.   loratadine (CLARITIN) 10 MG tablet Take 1 tablet (10 mg total) by mouth daily.   losartan (COZAAR) 100 MG tablet Take 1 tablet (100 mg total) by mouth daily at 10 pm.   Potassium Chloride ER 20 MEQ TBCR Take 1 tablet by mouth daily.   rosuvastatin (CRESTOR) 20 MG tablet Take 1 tablet (20 mg total) by mouth at bedtime.   No facility-administered encounter medications on file as of 11/29/2022.  :  Review of Systems:  Out of a complete 14 point review of systems, all are reviewed and negative with the exception of these symptoms as listed below:   Review of Systems  Neurological:        Pt here for CPAP f/u Pt states doing well on CPAP  machine Pt states  hearing is decreased  Pt states hearing is faint    ESS:1     Objective:  Neurological Exam  Physical Exam Physical Examination:   Vitals:   11/29/22 1050  BP: (!) 109/58  Pulse: 65    General Examination: The patient is a very pleasant 77 y.o. female in no acute distress. She appears well-developed and well-nourished and well groomed.   HEENT: Normocephalic, atraumatic, pupils are equal, round and reactive to light and accommodation.  Corrective eyeglasses in place.  Tracking well-preserved.  Face is symmetric with normal facial animation.  Speech is clear without dysarthria, hypophonia or voice tremor.  Neck with full range of motion, no  carotid bruits.  Airway examination reveals stable findings, no significant mouth dryness.  Tongue protrudes centrally and palate elevates symmetrically.     Chest: Clear to auscultation without wheezing, rhonchi or crackles noted.   Heart: S1+S2+0, regular and normal without murmurs, rubs or gallops noted.    Abdomen: Soft, non-tender and non-distended with normal bowel sounds appreciated on auscultation.   Extremities: There is no edema in the legs.     Skin: Warm and dry without trophic changes noted.    Musculoskeletal: exam reveals no obvious joint deformities.   Neurologically:  Mental status: The patient is awake, alert and oriented in all 4 spheres. Her immediate and remote memory, attention, language skills and fund of knowledge are appropriate. There is no evidence of aphasia, agnosia, apraxia or anomia. Speech is clear with normal prosody and enunciation. Thought process is linear. Mood is normal and affect is anxious. Cranial nerves II - XII are as described above under HEENT exam.  Motor exam: Normal bulk, strength and tone is noted. There is no tremor. Fine motor skills and coordination: grossly intact.  Cerebellar testing: No dysmetria or intention tremor. There is no truncal or gait ataxia.  Sensory exam: intact to light touch in the upper and lower extremities.  Gait, station and balance: She stands easily. No veering to one side is noted. No leaning to one side is noted. Posture is age-appropriate and stance is narrow based. Gait shows normal stride length and normal pace. No problems turning are noted. No obvious limp.  No walking aid.   Assessment and Plan:  In summary, Rachel Crane is a very pleasant 77 year old female with an underlying medical history of hypertension, arthritis, and obesity, who presents for FU consultation of her obstructive sleep apnea, well established on treatment with CPAP therapy for several years.  She received a new machine on 09/08/2022.  She  previously was on CPAP of 8 cm which we can increase to 9 cm.  She has been on CPAP of 10 cm for the past at least 1 year.  She has a residual AHI which is mildly elevated but I fear that she will have worsening mouth dryness if we increase the pressure further at this time.  She is agreeable to maintaining treatment at the current settings, she is commended for treatment adherence.  She reports ongoing good results.  She is agreeable to trying a slightly higher humidity setting at 5, she can increase it further if need be, and showed her how to do it.   Of note, she had a baseline sleep study on 09/18/16, followed by a CPAP titration study on 10/09/2016. We reviewed her most recent compliance data. She has had no recurrent headaches lately thankfully.  Prior head CT was benign. Neurological exam continues to be nonfocal.  She is advised to follow-up routinely to see the nurse practitioner in 1 year. I answered all her questions today and she was in agreement. I spent 30 minutes in total face-to-face time and in reviewing records during pre-charting, more than 50% of which was spent in counseling and coordination of care, reviewing test results, reviewing medications and treatment regimen and/or in discussing or reviewing the diagnosis of OSA, the prognosis and treatment options. Pertinent laboratory and imaging test results that were available during this visit with the patient were reviewed by me and considered in my medical decision making (see chart for details).

## 2022-11-29 NOTE — ED Provider Notes (Signed)
[facility]    CSN: [id number] Arrival date & time: 11/29/22  1222      History   Chief Complaint Chief Complaint  Patient presents with   Ear Fullness    HPI Rachel Crane is a 77 y.o. female.   77 year old female presents today due to concerns of ear fullness.  She states the symptoms have been present for the past 2 to 3 weeks.  She has had symptoms like this in the past, but states she feels like she pushed the earwax closer to her eardrum primarily on the left.  She denies any URI symptoms.  She has been using over-the-counter drops and Q-tips without relief.   Ear Fullness    Past Medical History:  Diagnosis Date   Abnormal stress ECG 10/06/2018   Aortic valve sclerosis 10/06/2018   Arthritis    Asthma    Bilateral carotid bruits 10/06/2018   Headache    H/O bad HA, since using CPAP- she has had improvement with that issue    Hypertension    Laboratory examination 10/06/2018   OSA on CPAP    settting - 8, uses CPAP q night    Pre-diabetes    Shingles     Patient Active Problem List   Diagnosis Date Noted   Status post total right knee replacement 02/15/2022   Chronic low back pain 02/15/2022   Moderate persistent asthma without complication 11/03/2021   Gastroesophageal reflux disease with esophagitis 11/03/2021   Status post total left knee replacement 06/06/2020   Positive ANA (antinuclear antibody) 04/25/2020   Primary osteoarthritis of left knee 04/14/2020   Lumbar radiculopathy 06/26/2019   Left-sided low back pain with left-sided sciatica 06/26/2019   Thoracic spinal stenosis 04/18/2019   Seasonal allergies 12/04/2018   Seasonal and perennial allergic rhinoconjunctivitis 10/10/2018   Moderate persistent asthma with acute exacerbation 10/10/2018   Pruritus 10/10/2018   Aortic valve sclerosis 10/06/2018   Bilateral carotid bruits 10/06/2018   Abnormal stress ECG 10/06/2018   Laboratory examination 10/06/2018   Palpitations 10/06/2018    Total knee replacement status 09/19/2017   Sprain of anterior talofibular ligament of right ankle 12/13/2016    Past Surgical History:  Procedure Laterality Date   CHOLECYSTECTOMY  1998   TOTAL KNEE ARTHROPLASTY Right 09/19/2017   Procedure: RIGHT TOTAL KNEE ARTHROPLASTY;  Surgeon: Tarry Kos, MD;  Location: [facility];  Service: Orthopedics;  Laterality: Right;   TOTAL KNEE ARTHROPLASTY Left 06/06/2020   Procedure: LEFT TOTAL KNEE ARTHROPLASTY;  Surgeon: Tarry Kos, MD;  Location: [facility];  Service: Orthopedics;  Laterality: Left;   TUBAL LIGATION      OB History   No obstetric history on file.      Home Medications    Prior to Admission medications   Medication Sig Start Date End Date Taking? Authorizing Provider  chlorthalidone (HYGROTON) 25 MG tablet Take 25 mg by mouth daily.  11/03/19  Yes [provider]  Dermatological Products, Misc. [facility]) lotion Apply twice a day to the body as a moisturizer 05/16/22  Yes Ellamae Sia, DO  gabapentin (NEURONTIN) 300 MG capsule Take 2 capsules (600 mg total) by mouth 3 (three) times daily. 11/05/22 02/03/23 Yes London Sheer, MD  JARDIANCE 10 MG TABS tablet Take 10 mg by mouth daily. 06/28/21  Yes [provider]  loratadine (CLARITIN) 10 MG tablet Take 1 tablet (10 mg total) by mouth daily. 09/04/22  Yes Ellamae Sia, DO  losartan (COZAAR) 100  MG tablet Take 1 tablet (100 mg total) by mouth daily at 10 pm. 09/18/22  Yes Tolia, Sunit, DO  Potassium Chloride ER 20 MEQ TBCR Take 1 tablet by mouth daily. 08/09/20  Yes [provider]  rosuvastatin (CRESTOR) 20 MG tablet Take 1 tablet (20 mg total) by mouth at bedtime. 09/18/22 12/17/22 Yes Tolia, Sunit, DO  acetic acid-hydrocortisone (VOSOL-HC) OTIC solution Place 3 drops into the left ear 2 (two) times daily. 11/29/22   Hien Perreira L, PA  albuterol (VENTOLIN HFA) 108 (90 Base) MCG/ACT inhaler Inhale 2 puffs into the lungs every 4 (four) hours as needed for wheezing or  shortness of breath (coughing fits). Patient taking differently: Inhale 2 puffs into the lungs as needed for wheezing or shortness of breath (coughing fits). 05/16/22   Ellamae Sia, DO  fluticasone (FLONASE) 50 MCG/ACT nasal spray Place 1 spray into both nostrils daily as needed (nasal congestion). 11/29/22   Yuka Lallier L, PA  methocarbamol (ROBAXIN) 500 MG tablet Take 1 tablet (500 mg total) by mouth 4 (four) times daily. 11/29/22 12/29/22  London Sheer, MD    Family History Family History  Problem Relation Age of Onset   Hypertension Mother    Arthritis Mother    Breast cancer Sister    Sleep apnea Neg Hx     Social History Social History   Tobacco Use   Smoking status: Never   Smokeless tobacco: Never  Vaping Use   Vaping Use: Never used  Substance Use Topics   Alcohol use: Not Currently   Drug use: No     Allergies   Hydrocodone-acetaminophen   Review of Systems Review of Systems As per HPI  Physical Exam Triage Vital Signs ED Triage Vitals  Enc Vitals Group     BP 11/29/22 1426 (!) 100/47     Pulse Rate 11/29/22 1426 (!) 56     Resp 11/29/22 1426 18     Temp 11/29/22 1426 98.9 F (37.2 C)     Temp Source 11/29/22 1426 Oral     SpO2 11/29/22 1426 96 %     Weight --      Height --      Head Circumference --      Peak Flow --      Pain Score 11/29/22 1423 0     Pain Loc --      Pain Edu? --      Excl. in GC? --    No data found.  Updated Vital Signs BP (!) 100/47 (BP Location: Right Arm)   Pulse (!) 56   Temp 98.9 F (37.2 C) (Oral)   Resp 18   SpO2 96%   Visual Acuity Right Eye Distance:   Left Eye Distance:   Bilateral Distance:    Right Eye Near:   Left Eye Near:    Bilateral Near:     Physical Exam Vitals and nursing note reviewed. Exam conducted with a chaperone present.  Constitutional:      General: She is not in acute distress.    Appearance: Normal appearance. She is well-developed. She is not ill-appearing,  toxic-appearing or diaphoretic.  HENT:     Head: Normocephalic and atraumatic.     Right Ear: Tympanic membrane and external ear normal. No drainage, swelling or tenderness. There is impacted cerumen. Tympanic membrane is not injected, scarred, perforated or erythematous.     Left Ear: Ear canal and external ear normal. No drainage, swelling or tenderness. A middle  ear effusion is present. There is impacted cerumen. Tympanic membrane is not injected, scarred, perforated or erythematous. Tympanic membrane has decreased mobility.     Ears:     Comments: Mild erythema to L EAC without debris or edema    Nose: Nose normal. No congestion or rhinorrhea.     Mouth/Throat:     Mouth: Mucous membranes are moist.  Eyes:     General: No scleral icterus.       Right eye: No discharge.        Left eye: No discharge.     Pupils: Pupils are equal, round, and reactive to light.  Cardiovascular:     Rate and Rhythm: Bradycardia present.  Pulmonary:     Effort: Pulmonary effort is normal. No respiratory distress.  Musculoskeletal:        General: No swelling.     Cervical back: Neck supple.  Skin:    General: Skin is warm and dry.     Capillary Refill: Capillary refill takes less than 2 seconds.  Neurological:     Mental Status: She is alert.  Psychiatric:        Mood and Affect: Mood normal.      UC Treatments / Results  Labs (all labs ordered are listed, but only abnormal results are displayed) Labs Reviewed - No data to display  EKG   Radiology No results found.  Procedures Ear Cerumen Removal  Date/Time: 11/29/2022 3:46 PM  Performed by: Maretta Bees, PA Authorized by: Maretta Bees, PA   Consent:    Consent obtained:  Verbal   Consent given by:  Patient   Risks, benefits, and alternatives were discussed: yes     Risks discussed:  Bleeding, dizziness, incomplete removal, infection, pain and TM perforation   Alternatives discussed:  No treatment and alternative  treatment Universal protocol:    Procedure explained and questions answered to patient or proxy's satisfaction: yes     Relevant documents present and verified: yes     Site/side marked: yes     Patient identity confirmed:  Verbally with patient Procedure details:    Location:  L ear and R ear   Procedure type: curette     Procedure type comment:  Both irrigation and curette used   Procedure outcomes: cerumen removed   Post-procedure details:    Inspection:  No bleeding, ear canal clear and TM intact   Hearing quality:  Improved   Procedure completion:  Tolerated  (including critical care time)  Medications Ordered in UC Medications - No data to display  Initial Impression / Assessment and Plan / UC Course  I have reviewed the triage vital signs and the nursing notes.  Pertinent labs & imaging results that were available during my care of the patient were reviewed by me and considered in my medical decision making (see chart for details).     Cerumen impaction, bilaterally - removed in office with a combination between irrigation and curette. Tolerated well, all wax removed. Middle ear effusion L - Start flonase to help with middle ear effusion on L L EAC irritation - start hydrocortisone/ acetic acid drops for 3-5 days.   Final Clinical Impressions(s) / UC Diagnoses   Final diagnoses:  Bilateral impacted cerumen  Middle ear effusion, left  Irritation of external auditory canal, left     Discharge Instructions      Please start using 3 drops into the left ear canal 2 times daily for the next 5 days  of the hydrocortisone acetic acid otic solution. Please use 1 spray of Flonase twice daily to the left nare to help with the fluid behind your tympanic membrane on the left.  We successfully removed all of the earwax from both of your ears today in the office. Please return to clinic or follow-up with your primary care physician should any symptoms persist.     ED  Prescriptions     Medication Sig Dispense Auth. Provider   fluticasone (FLONASE) 50 MCG/ACT nasal spray  (Status: Discontinued) Place 1 spray into both nostrils daily as needed (nasal congestion). 16 mL Calixto Pavel L, PA   acetic acid-hydrocortisone (VOSOL-HC) OTIC solution  (Status: Discontinued) Place 3 drops into the left ear 2 (two) times daily. 10 mL Tarisha Fader L, PA   acetic acid-hydrocortisone (VOSOL-HC) OTIC solution Place 3 drops into the left ear 2 (two) times daily. 10 mL Edwardine Deschepper L, PA   fluticasone (FLONASE) 50 MCG/ACT nasal spray Place 1 spray into both nostrils daily as needed (nasal congestion). 16 mL Tyrian Peart L, PA      PDMP not reviewed this encounter.   Maretta Bees, [place] 11/29/22 2350

## 2022-11-29 NOTE — Telephone Encounter (Signed)
Patient here. Would like 71m not 66m for the pain medication. Says 69m does not work.

## 2022-11-29 NOTE — ED Triage Notes (Signed)
Pt states that she has bilateral ear fullness x 2-3 weeks. She states she cleaned her ear with a ear thing. She has been use OTC ear drops.

## 2022-11-29 NOTE — Telephone Encounter (Signed)
She said she needs the muscle relaxer.

## 2022-11-29 NOTE — Discharge Instructions (Signed)
Please start using 3 drops into the left ear canal 2 times daily for the next 5 days of the hydrocortisone acetic acid otic solution. Please use 1 spray of Flonase twice daily to the left nare to help with the fluid behind your tympanic membrane on the left.  We successfully removed all of the earwax from both of your ears today in the office. Please return to clinic or follow-up with your primary care physician should any symptoms persist.

## 2022-11-29 NOTE — Addendum Note (Signed)
Addended by: Willia Craze on: 11/29/2022 04:55 PM   Modules accepted: Orders

## 2022-11-29 NOTE — Patient Instructions (Signed)
It was so nice to catch up and see you again today.  We can see you in 1 year, you can see one of our nurse practitioners as you are stable. I will see you after that.

## 2022-12-18 DIAGNOSIS — H401131 Primary open-angle glaucoma, bilateral, mild stage: Secondary | ICD-10-CM | POA: Diagnosis not present

## 2022-12-25 ENCOUNTER — Other Ambulatory Visit: Payer: Self-pay | Admitting: Cardiology

## 2022-12-25 DIAGNOSIS — E782 Mixed hyperlipidemia: Secondary | ICD-10-CM

## 2023-01-03 ENCOUNTER — Ambulatory Visit: Payer: Medicare Other | Admitting: Orthopedic Surgery

## 2023-01-04 ENCOUNTER — Ambulatory Visit (HOSPITAL_COMMUNITY)
Admission: EM | Admit: 2023-01-04 | Discharge: 2023-01-04 | Disposition: A | Discharge status: Home / Self Care | Payer: Medicare Other

## 2023-01-04 ENCOUNTER — Encounter (HOSPITAL_COMMUNITY): Payer: Self-pay | Admitting: *Deleted

## 2023-01-04 DIAGNOSIS — J4521 Mild intermittent asthma with (acute) exacerbation: Secondary | ICD-10-CM

## 2023-01-04 DIAGNOSIS — J069 Acute upper respiratory infection, unspecified: Secondary | ICD-10-CM

## 2023-01-04 MED ORDER — PREDNISONE 20 MG PO TABS: 40.0000 mg | ORAL_TABLET | Freq: Every day | ORAL | 0 refills | Status: AC

## 2023-01-04 MED ORDER — AZITHROMYCIN 250 MG PO TABS: 250.0000 mg | ORAL_TABLET | Freq: Every day | ORAL | 0 refills | Status: DC

## 2023-01-04 MED ORDER — BENZONATATE 100 MG PO CAPS: 100.0000 mg | ORAL_CAPSULE | Freq: Three times a day (TID) | ORAL | 0 refills | Status: DC

## 2023-01-04 NOTE — ED Triage Notes (Signed)
Pt states she has cough and congestion since last monday. She states the pharmacy advised to take allegra but it doesn't seem to be helping

## 2023-01-04 NOTE — Discharge Instructions (Addendum)
I believe you have a bacterial upper respiratory infection that has triggered a mild asthma exacerbation.  Please take all antibiotics as prescribed until finished.  You can take the Tessalon Perles every 8 hours as needed for cough suppression.  Starting tomorrow, take the steroids with breakfast.  Use your inhaler as needed.  You can continue with 1200 mg of Mucinex daily and the daily antihistamine, Allegra.  Please ensure you are drinking at least 64 ounces of water daily.  Please return to clinic or seek immediate care if you develop shortness of breath, chest pain, high fevers, or any new concerning symptoms.  Please return to clinic or follow-up with your primary care if you do not have any improvement despite finishing antibiotics and steroids.

## 2023-01-04 NOTE — ED Provider Notes (Signed)
[facility]    CSN: [id number] Arrival date & time: 01/04/23  1833      History   Chief Complaint Chief Complaint  Patient presents with   Cough   Nasal Congestion    HPI Rachel Crane is a 77 y.o. female.   Patient presents to clinic for complaints of cough, nasal congestion and wheezing since June 10.  Patient went to the pharmacy and she has been doing Allegra and Mucinex, which have helped some with her congestion.  Cough and wheezing remain.  She denies sinus pain or pressure, no fevers.    Has been using her albuterol inhaler for her wheezing, history of asthma.  Does not smoke.    The history is provided by the patient and medical records.  Cough Associated symptoms: rhinorrhea and wheezing   Associated symptoms: no fever     Past Medical History:  Diagnosis Date   Abnormal stress ECG 10/06/2018   Aortic valve sclerosis 10/06/2018   Arthritis    Asthma    Bilateral carotid bruits 10/06/2018   Headache    H/O bad HA, since using CPAP- she has had improvement with that issue    Hypertension    Laboratory examination 10/06/2018   OSA on CPAP    settting - 8, uses CPAP q night    Pre-diabetes    Shingles     Patient Active Problem List   Diagnosis Date Noted   Status post total right knee replacement 02/15/2022   Chronic low back pain 02/15/2022   Moderate persistent asthma without complication 11/03/2021   Gastroesophageal reflux disease with esophagitis 11/03/2021   Status post total left knee replacement 06/06/2020   Positive ANA (antinuclear antibody) 04/25/2020   Primary osteoarthritis of left knee 04/14/2020   Lumbar radiculopathy 06/26/2019   Left-sided low back pain with left-sided sciatica 06/26/2019   Thoracic spinal stenosis 04/18/2019   Seasonal allergies 12/04/2018   Seasonal and perennial allergic rhinoconjunctivitis 10/10/2018   Moderate persistent asthma with acute exacerbation 10/10/2018   Pruritus 10/10/2018   Aortic valve  sclerosis 10/06/2018   Bilateral carotid bruits 10/06/2018   Abnormal stress ECG 10/06/2018   Laboratory examination 10/06/2018   Palpitations 10/06/2018   Total knee replacement status 09/19/2017   Sprain of anterior talofibular ligament of right ankle 12/13/2016    Past Surgical History:  Procedure Laterality Date   CHOLECYSTECTOMY  1998   TOTAL KNEE ARTHROPLASTY Right 09/19/2017   Procedure: RIGHT TOTAL KNEE ARTHROPLASTY;  Surgeon: Tarry Kos, MD;  Location: [facility];  Service: Orthopedics;  Laterality: Right;   TOTAL KNEE ARTHROPLASTY Left 06/06/2020   Procedure: LEFT TOTAL KNEE ARTHROPLASTY;  Surgeon: Tarry Kos, MD;  Location: [facility];  Service: Orthopedics;  Laterality: Left;   TUBAL LIGATION      OB History   No obstetric history on file.      Home Medications    Prior to Admission medications   Medication Sig Start Date End Date Taking? Authorizing Provider  acetic acid-hydrocortisone (VOSOL-HC) OTIC solution Place 3 drops into the left ear 2 (two) times daily. 11/29/22  Yes Crain, Whitney L, PA  albuterol (VENTOLIN HFA) 108 (90 Base) MCG/ACT inhaler Inhale 2 puffs into the lungs every 4 (four) hours as needed for wheezing or shortness of breath (coughing fits). Patient taking differently: Inhale 2 puffs into the lungs as needed for wheezing or shortness of breath (coughing fits). 05/16/22  Yes Ellamae Sia, DO  azithromycin (ZITHROMAX) 250 MG tablet  Take 1 tablet (250 mg total) by mouth daily. Take first 2 tablets together, then 1 every day until finished. 01/04/23  Yes Rinaldo Ratel, [place] N, FNP  benzonatate (TESSALON) 100 MG capsule Take 1 capsule (100 mg total) by mouth every 8 (eight) hours. 01/04/23  Yes Rinaldo Ratel, [place] N, FNP  chlorthalidone (HYGROTON) 25 MG tablet Take 25 mg by mouth daily.  11/03/19  Yes [provider]  Dermatological Products, Misc. [facility]) lotion Apply twice a day to the body as a moisturizer 05/16/22  Yes Ellamae Sia, DO  fluticasone  [facility]) 50 MCG/ACT nasal spray Place 1 spray into both nostrils daily as needed (nasal congestion). 11/29/22  Yes Crain, Whitney L, PA  gabapentin (NEURONTIN) 300 MG capsule Take 2 capsules (600 mg total) by mouth 3 (three) times daily. 11/05/22 02/03/23 Yes London Sheer, MD  JARDIANCE 10 MG TABS tablet Take 10 mg by mouth daily. 06/28/21  Yes [provider]  loratadine (CLARITIN) 10 MG tablet Take 1 tablet (10 mg total) by mouth daily. 09/04/22  Yes Ellamae Sia, DO  losartan (COZAAR) 100 MG tablet Take 1 tablet (100 mg total) by mouth daily at 10 pm. 09/18/22  Yes Tolia, Sunit, DO  Potassium Chloride ER 20 MEQ TBCR Take 1 tablet by mouth daily. 08/09/20  Yes [provider]  predniSONE (DELTASONE) 20 MG tablet Take 2 tablets (40 mg total) by mouth daily with breakfast for 5 days. 01/04/23 01/09/23 Yes Rinaldo Ratel, [place] N, FNP  rosuvastatin (CRESTOR) 20 MG tablet TAKE 1 TABLET(20 MG) BY MOUTH AT BEDTIME 12/25/22  Yes Tolia, Sunit, DO  fexofenadine (ALLEGRA ALLERGY) 180 MG tablet 1 tablet Orally Once a day for 30 day(s)    [provider]    Family History Family History  Problem Relation Age of Onset   Hypertension Mother    Arthritis Mother    Breast cancer Sister    Sleep apnea Neg Hx     Social History Social History   Tobacco Use   Smoking status: Never   Smokeless tobacco: Never  Vaping Use   Vaping Use: Never used  Substance Use Topics   Alcohol use: Not Currently   Drug use: No     Allergies   Hydrocodone-acetaminophen   Review of Systems Review of Systems  Constitutional:  Negative for fatigue and fever.  HENT:  Positive for congestion and rhinorrhea.   Respiratory:  Positive for cough and wheezing.      Physical Exam Triage Vital Signs ED Triage Vitals  Enc Vitals Group     BP 01/04/23 1925 114/65     Pulse Rate 01/04/23 1925 64     Resp 01/04/23 1925 18     Temp 01/04/23 1925 98.4 F (36.9 C)     Temp Source 01/04/23 1925 Oral      SpO2 01/04/23 1925 96 %     Weight --      Height --      Head Circumference --      Peak Flow --      Pain Score 01/04/23 1922 0     Pain Loc --      Pain Edu? --      Excl. in GC? --    No data found.  Updated Vital Signs BP 114/65 (BP Location: Left Arm)   Pulse 64   Temp 98.4 F (36.9 C) (Oral)   Resp 18   SpO2 96%   Visual Acuity Right Eye Distance:   Left  Eye Distance:   Bilateral Distance:    Right Eye Near:   Left Eye Near:    Bilateral Near:     Physical Exam Vitals and nursing note reviewed.  Constitutional:      Appearance: Normal appearance.  HENT:     Head: Normocephalic and atraumatic.     Right Ear: External ear normal.     Left Ear: External ear normal.     Nose: Congestion and rhinorrhea present.     Mouth/Throat:     Pharynx: Posterior oropharyngeal erythema present.  Eyes:     General: No scleral icterus. Cardiovascular:     Rate and Rhythm: Normal rate and regular rhythm.     Heart sounds: Normal heart sounds. No murmur heard. Pulmonary:     Effort: Pulmonary effort is normal.     Breath sounds: Examination of the right-upper field reveals wheezing. Examination of the left-upper field reveals wheezing. Wheezing present.     Comments: Expiratory wheezing in upper lobes. Musculoskeletal:        General: No swelling. Normal range of motion.  Skin:    General: Skin is warm and dry.  Neurological:     General: No focal deficit present.     Mental Status: She is alert.  Psychiatric:        Mood and Affect: Mood normal.        Behavior: Behavior is cooperative.      UC Treatments / Results  Labs (all labs ordered are listed, but only abnormal results are displayed) Labs Reviewed - No data to display  EKG   Radiology No results found.  Procedures Procedures (including critical care time)  Medications Ordered in UC Medications - No data to display  Initial Impression / Assessment and Plan / UC Course  I have reviewed the  triage vital signs and the nursing notes.  Pertinent labs & imaging results that were available during my care of the patient were reviewed by me and considered in my medical decision making (see chart for details).  Vitals and triage reviewed, patient is hemodynamically stable.  Expiratory wheezing on physical exam with postnasal drip and rhinorrhea.  Patient is in no acute distress, afebrile without tachycardia, chest imaging deferred at this time.  Symptoms have been present for 11 days.  Treated for bacterial upper respiratory infection and asthma exacerbation with azithromycin and oral steroids.  Encouraged to continue with Mucinex and Allegra.  Plan of care, follow-up care and return precautions given, no questions at this time.     Final Clinical Impressions(s) / UC Diagnoses   Final diagnoses:  Upper respiratory tract infection, unspecified type  Mild intermittent asthma with acute exacerbation     Discharge Instructions      I believe you have a bacterial upper respiratory infection that has triggered a mild asthma exacerbation.  Please take all antibiotics as prescribed until finished.  You can take the Tessalon Perles every 8 hours as needed for cough suppression.  Starting tomorrow, take the steroids with breakfast.  Use your inhaler as needed.  You can continue with 1200 mg of Mucinex daily and the daily antihistamine, Allegra.  Please ensure you are drinking at least 64 ounces of water daily.  Please return to clinic or seek immediate care if you develop shortness of breath, chest pain, high fevers, or any new concerning symptoms.  Please return to clinic or follow-up with your primary care if you do not have any improvement despite finishing antibiotics and steroids.  ED Prescriptions     Medication Sig Dispense Auth. Provider   azithromycin (ZITHROMAX) 250 MG tablet Take 1 tablet (250 mg total) by mouth daily. Take first 2 tablets together, then 1 every day until  finished. 6 tablet Rinaldo Ratel, [place] N, [place]   predniSONE (DELTASONE) 20 MG tablet Take 2 tablets (40 mg total) by mouth daily with breakfast for 5 days. 10 tablet Rinaldo Ratel, [place] N, FNP   benzonatate (TESSALON) 100 MG capsule Take 1 capsule (100 mg total) by mouth every 8 (eight) hours. 21 capsule Aristotelis Vilardi, [place] N, [place]      PDMP not reviewed this encounter.   Kadyn Guild, [place] N, [place] 01/04/23 [phone]

## 2023-01-14 ENCOUNTER — Other Ambulatory Visit: Payer: Self-pay | Admitting: Allergy

## 2023-01-18 ENCOUNTER — Ambulatory Visit (INDEPENDENT_AMBULATORY_CARE_PROVIDER_SITE_OTHER): Payer: Medicare Other

## 2023-01-18 ENCOUNTER — Encounter (HOSPITAL_COMMUNITY): Payer: Self-pay

## 2023-01-18 ENCOUNTER — Ambulatory Visit (HOSPITAL_COMMUNITY)
Admission: EM | Admit: 2023-01-18 | Discharge: 2023-01-18 | Disposition: A | Discharge status: Home / Self Care | Payer: Medicare Other | Attending: Family Medicine | Admitting: Family Medicine

## 2023-01-18 DIAGNOSIS — J4541 Moderate persistent asthma with (acute) exacerbation: Secondary | ICD-10-CM

## 2023-01-18 DIAGNOSIS — R0989 Other specified symptoms and signs involving the circulatory and respiratory systems: Secondary | ICD-10-CM | POA: Diagnosis not present

## 2023-01-18 DIAGNOSIS — R062 Wheezing: Secondary | ICD-10-CM | POA: Diagnosis not present

## 2023-01-18 DIAGNOSIS — R059 Cough, unspecified: Secondary | ICD-10-CM | POA: Diagnosis not present

## 2023-01-18 DIAGNOSIS — J019 Acute sinusitis, unspecified: Secondary | ICD-10-CM | POA: Diagnosis not present

## 2023-01-18 MED ORDER — PROMETHAZINE-DM 6.25-15 MG/5ML PO SYRP: 5.0000 mL | ORAL_SOLUTION | Freq: Four times a day (QID) | ORAL | 0 refills | Status: DC | PRN

## 2023-01-18 MED ORDER — CEFDINIR 300 MG PO CAPS: 600.0000 mg | ORAL_CAPSULE | Freq: Every day | ORAL | 0 refills | Status: AC

## 2023-01-18 MED ORDER — BUDESONIDE-FORMOTEROL FUMARATE 80-4.5 MCG/ACT IN AERO: 2.0000 | INHALATION_SPRAY | Freq: Two times a day (BID) | RESPIRATORY_TRACT | 0 refills | Status: DC

## 2023-01-18 MED ORDER — PREDNISONE 20 MG PO TABS: ORAL_TABLET | ORAL | 0 refills | Status: DC

## 2023-01-18 NOTE — Discharge Instructions (Signed)
Your chest x-ray did not show any pneumonia or fluid  Take cefdinir 300 mg--2 capsules together daily for 7 days; this is an antibiotic for possible sinus infection  Take prednisone 20 mg--3 tabs daily x3 days, then 2 tabs daily x3 days, then 1 tab daily x3 days, then one half tab daily x3 days, then stop  Take Phenergan with dextromethorphan syrup--5 mL or 1 teaspoon every 6 hours as needed for cough  Symbicort -inhale 2 puffs 2 times daily.  Rinse your mouth after use.  This is to help control your asthma better continue using the albuterol as needed Please follow-up with your primary care, as they may need to adjust doses further therapies.

## 2023-01-18 NOTE — ED Provider Notes (Signed)
[facility]    CSN: [id number] Arrival date & time: 01/18/23  1357      History   Chief Complaint Chief Complaint  Patient presents with   Cough    HPI Rachel Crane is a 77 y.o. female.    Cough  Here for continued cough worsening postnasal drainage.  She is wheezing again and feels congestion in her chest.  She was seen here June 21, at which time she had more persistent cough, and she states she does feel better compared to before being seen here on the 21st. No fever or chills  When asked, she states she is not short of breath, but then she states she is wheezing and having rattling in her central chest.  She does have a history of asthma and uses her albuterol as needed   Past Medical History:  Diagnosis Date   Abnormal stress ECG 10/06/2018   Aortic valve sclerosis 10/06/2018   Arthritis    Asthma    Bilateral carotid bruits 10/06/2018   Headache    H/O bad HA, since using CPAP- she has had improvement with that issue    Hypertension    Laboratory examination 10/06/2018   OSA on CPAP    settting - 8, uses CPAP q night    Pre-diabetes    Shingles     Patient Active Problem List   Diagnosis Date Noted   Status post total right knee replacement 02/15/2022   Chronic low back pain 02/15/2022   Moderate persistent asthma without complication 11/03/2021   Gastroesophageal reflux disease with esophagitis 11/03/2021   Status post total left knee replacement 06/06/2020   Positive ANA (antinuclear antibody) 04/25/2020   Primary osteoarthritis of left knee 04/14/2020   Lumbar radiculopathy 06/26/2019   Left-sided low back pain with left-sided sciatica 06/26/2019   Thoracic spinal stenosis 04/18/2019   Seasonal allergies 12/04/2018   Seasonal and perennial allergic rhinoconjunctivitis 10/10/2018   Moderate persistent asthma with acute exacerbation 10/10/2018   Pruritus 10/10/2018   Aortic valve sclerosis 10/06/2018   Bilateral carotid bruits 10/06/2018    Abnormal stress ECG 10/06/2018   Laboratory examination 10/06/2018   Palpitations 10/06/2018   Total knee replacement status 09/19/2017   Sprain of anterior talofibular ligament of right ankle 12/13/2016    Past Surgical History:  Procedure Laterality Date   CHOLECYSTECTOMY  1998   TOTAL KNEE ARTHROPLASTY Right 09/19/2017   Procedure: RIGHT TOTAL KNEE ARTHROPLASTY;  Surgeon: Tarry Kos, MD;  Location: [facility];  Service: Orthopedics;  Laterality: Right;   TOTAL KNEE ARTHROPLASTY Left 06/06/2020   Procedure: LEFT TOTAL KNEE ARTHROPLASTY;  Surgeon: Tarry Kos, MD;  Location: [facility];  Service: Orthopedics;  Laterality: Left;   TUBAL LIGATION      OB History   No obstetric history on file.      Home Medications    Prior to Admission medications   Medication Sig Start Date End Date Taking? Authorizing Provider  budesonide-formoterol (SYMBICORT) 80-4.5 MCG/ACT inhaler Inhale 2 puffs into the lungs in the morning and at bedtime. 01/18/23  Yes Zenia Resides, MD  cefdinir (OMNICEF) 300 MG capsule Take 2 capsules (600 mg total) by mouth daily for 7 days. 01/18/23 01/25/23 Yes Zenia Resides, MD  predniSONE (DELTASONE) 20 MG tablet 3 tabs daily x3 days, then 2 tabs daily x3 days, then 1 tab daily x3 days, then one half tab daily x3 days, then stop 01/18/23  Yes Richelle Glick, Janace Aris, MD  promethazine-dextromethorphan (  PROMETHAZINE-DM) 6.25-15 MG/5ML syrup Take 5 mLs by mouth 4 (four) times daily as needed for cough. 01/18/23  Yes Kiyanna Biegler, Janace Aris, MD  acetic acid-hydrocortisone (VOSOL-HC) OTIC solution Place 3 drops into the left ear 2 (two) times daily. 11/29/22   Crain, Whitney L, PA  albuterol (VENTOLIN HFA) 108 (90 Base) MCG/ACT inhaler Inhale 2 puffs into the lungs every 4 (four) hours as needed for wheezing or shortness of breath (coughing fits). Patient taking differently: Inhale 2 puffs into the lungs as needed for wheezing or shortness of breath (coughing fits). 05/16/22   Ellamae Sia,  DO  chlorthalidone (HYGROTON) 25 MG tablet Take 25 mg by mouth daily.  11/03/19   [provider]  Dermatological Products, Misc. [facility]) lotion Apply twice a day to the body as a moisturizer 05/16/22   Ellamae Sia, DO  fexofenadine (ALLEGRA ALLERGY) 180 MG tablet 1 tablet Orally Once a day for 30 day(s)    [provider]  fluticasone (FLONASE) 50 MCG/ACT nasal spray Place 1 spray into both nostrils daily as needed (nasal congestion). 11/29/22   Crain, Whitney L, PA  gabapentin (NEURONTIN) 300 MG capsule Take 2 capsules (600 mg total) by mouth 3 (three) times daily. 11/05/22 02/03/23  London Sheer, MD  JARDIANCE 10 MG TABS tablet Take 10 mg by mouth daily. 06/28/21   [provider]  loratadine (CLARITIN) 10 MG tablet Take 1 tablet (10 mg total) by mouth daily. 09/04/22   Ellamae Sia, DO  losartan (COZAAR) 100 MG tablet Take 1 tablet (100 mg total) by mouth daily at 10 pm. 09/18/22   Tolia, Sunit, DO  Potassium Chloride ER 20 MEQ TBCR Take 1 tablet by mouth daily. 08/09/20   [provider]  rosuvastatin (CRESTOR) 20 MG tablet TAKE 1 TABLET(20 MG) BY MOUTH AT BEDTIME 12/25/22   Tessa Lerner, DO    Family History Family History  Problem Relation Age of Onset   Hypertension Mother    Arthritis Mother    Breast cancer Sister    Sleep apnea Neg Hx     Social History Social History   Tobacco Use   Smoking status: Never   Smokeless tobacco: Never  Vaping Use   Vaping Use: Never used  Substance Use Topics   Alcohol use: Not Currently   Drug use: No     Allergies   Hydrocodone-acetaminophen   Review of Systems Review of Systems  Respiratory:  Positive for cough.      Physical Exam Triage Vital Signs ED Triage Vitals  Enc Vitals Group     BP 01/18/23 1442 (!) 145/67     Pulse Rate 01/18/23 1442 75     Resp 01/18/23 1442 16     Temp 01/18/23 1442 98.4 F (36.9 C)     Temp Source 01/18/23 1442 Oral     SpO2 01/18/23 1442 95 %     Weight  --      Height --      Head Circumference --      Peak Flow --      Pain Score 01/18/23 1444 0     Pain Loc --      Pain Edu? --      Excl. in GC? --    No data found.  Updated Vital Signs BP (!) 145/67 (BP Location: Left Arm)   Pulse 75   Temp 98.4 F (36.9 C) (Oral)   Resp 16   SpO2 95%   Visual  Acuity Right Eye Distance:   Left Eye Distance:   Bilateral Distance:    Right Eye Near:   Left Eye Near:    Bilateral Near:     Physical Exam Vitals reviewed.  Constitutional:      General: She is not in acute distress.    Appearance: She is not ill-appearing, toxic-appearing or diaphoretic.  HENT:     Nose: Nose normal.     Mouth/Throat:     Mouth: Mucous membranes are moist.  Eyes:     Extraocular Movements: Extraocular movements intact.     Conjunctiva/sclera: Conjunctivae normal.     Pupils: Pupils are equal, round, and reactive to light.  Cardiovascular:     Rate and Rhythm: Normal rate and regular rhythm.     Heart sounds: No murmur heard. Pulmonary:     Effort: Pulmonary effort is normal. No respiratory distress.     Breath sounds: No stridor. No wheezing, rhonchi or rales.  Chest:     Chest wall: No tenderness.  Skin:    Coloration: Skin is not pale.  Neurological:     General: No focal deficit present.     Mental Status: She is alert and oriented to person, place, and time.  Psychiatric:        Behavior: Behavior normal.      UC Treatments / Results  Labs (all labs ordered are listed, but only abnormal results are displayed) Labs Reviewed - No data to display  EKG   Radiology DG Chest 2 View  Result Date: 01/18/2023 CLINICAL DATA:  congestion in chest and wheezing and cough EXAM: CHEST - 2 VIEW COMPARISON:  03/02/2021 FINDINGS: Lungs are clear. Heart size and mediastinal contours are within normal limits. Aortic Atherosclerosis (ICD10-170.0). No effusion. Cholecystectomy clips. IMPRESSION: No acute cardiopulmonary disease. Electronically Signed    By: Corlis Leak M.D.   On: 01/18/2023 15:27    Procedures Procedures (including critical care time)  Medications Ordered in UC Medications - No data to display  Initial Impression / Assessment and Plan / UC Course  I have reviewed the triage vital signs and the nursing notes.  Pertinent labs & imaging results that were available during my care of the patient were reviewed by me and considered in my medical decision making (see chart for details).        Chest x-ray does not show any infiltrate or fluid.  I went to send in a cephalosporin for possible acute sinusitis A longer steroid taper is done for the wheezing and possible continued asthma exacerbation   I am also going to send in a daily controller inhaler.  I have asked her to follow-up with primary care as they may have to adjust the doses on the daily controller or provide a different prescription depending on her insurance coverage  Final Clinical Impressions(s) / UC Diagnoses   Final diagnoses:  Acute sinusitis, recurrence not specified, unspecified location  Moderate persistent asthma with exacerbation     Discharge Instructions      Your chest x-ray did not show any pneumonia or fluid  Take cefdinir 300 mg--2 capsules together daily for 7 days; this is an antibiotic for possible sinus infection  Take prednisone 20 mg--3 tabs daily x3 days, then 2 tabs daily x3 days, then 1 tab daily x3 days, then one half tab daily x3 days, then stop  Take Phenergan with dextromethorphan syrup--5 mL or 1 teaspoon every 6 hours as needed for cough  Symbicort -inhale 2 puffs  2 times daily.  Rinse your mouth after use.  This is to help control your asthma better continue using the albuterol as needed Please follow-up with your primary care, as they may need to adjust doses further therapies.     ED Prescriptions     Medication Sig Dispense Auth. Provider   predniSONE (DELTASONE) 20 MG tablet 3 tabs daily x3 days, then 2  tabs daily x3 days, then 1 tab daily x3 days, then one half tab daily x3 days, then stop 20 tablet Berta Denson, Janace Aris, MD   promethazine-dextromethorphan (PROMETHAZINE-DM) 6.25-15 MG/5ML syrup Take 5 mLs by mouth 4 (four) times daily as needed for cough. 118 mL Zenia Resides, MD   budesonide-formoterol [facility]) 80-4.5 MCG/ACT inhaler Inhale 2 puffs into the lungs in the morning and at bedtime. 1 each Zenia Resides, MD   cefdinir (OMNICEF) 300 MG capsule Take 2 capsules (600 mg total) by mouth daily for 7 days. 14 capsule Marlinda Mike, Janace Aris, MD      PDMP not reviewed this encounter.   Zenia Resides, MD 01/18/23 1537

## 2023-01-18 NOTE — ED Triage Notes (Signed)
Pt states cough and itchy throat for the past few weeks.  Seen here for same, states she  is not feeling any better.

## 2023-01-22 DIAGNOSIS — J40 Bronchitis, not specified as acute or chronic: Secondary | ICD-10-CM | POA: Diagnosis not present

## 2023-01-22 DIAGNOSIS — E785 Hyperlipidemia, unspecified: Secondary | ICD-10-CM | POA: Diagnosis not present

## 2023-01-22 DIAGNOSIS — E1169 Type 2 diabetes mellitus with other specified complication: Secondary | ICD-10-CM | POA: Diagnosis not present

## 2023-01-22 DIAGNOSIS — I10 Essential (primary) hypertension: Secondary | ICD-10-CM | POA: Diagnosis not present

## 2023-01-24 ENCOUNTER — Ambulatory Visit: Payer: Medicare Other | Admitting: Orthopedic Surgery

## 2023-01-28 ENCOUNTER — Emergency Department (HOSPITAL_COMMUNITY)
Admission: EM | Admit: 2023-01-28 | Discharge: 2023-01-28 | Disposition: A | Discharge status: Home / Self Care | Payer: Medicare Other | Attending: Emergency Medicine | Admitting: Emergency Medicine

## 2023-01-28 ENCOUNTER — Encounter (HOSPITAL_COMMUNITY): Payer: Self-pay

## 2023-01-28 ENCOUNTER — Other Ambulatory Visit: Payer: Self-pay

## 2023-01-28 ENCOUNTER — Emergency Department (HOSPITAL_COMMUNITY): Payer: Medicare Other

## 2023-01-28 DIAGNOSIS — S0990XA Unspecified injury of head, initial encounter: Secondary | ICD-10-CM | POA: Insufficient documentation

## 2023-01-28 DIAGNOSIS — Y92002 Bathroom of unspecified non-institutional (private) residence single-family (private) house as the place of occurrence of the external cause: Secondary | ICD-10-CM | POA: Diagnosis not present

## 2023-01-28 DIAGNOSIS — W228XXA Striking against or struck by other objects, initial encounter: Secondary | ICD-10-CM | POA: Diagnosis not present

## 2023-01-28 NOTE — Discharge Instructions (Addendum)
You were seen in the emergency department for a head injury. Your CT scan was negative for any signs of bleeding or brain masses. I would advise following up with your primary care provider in the next week for repeat evaluation. If you feel that symptoms are worsening, please return to the ER.

## 2023-01-28 NOTE — ED Provider Notes (Signed)
[facility] AT [facility] Provider Note   CSN: [id number] Arrival date & time: 01/28/23  1623     History Chief Complaint  Patient presents with   Head Injury    Rachel Crane is a 77 y.o. female. Patient with past history significant for aortic valve sclerosis presents to the ED with concerns of a head injury. She reports she was hit on the top of her head with a metal door in the bathroom about 2-3 days ago. Endorses pain lingering in this area but no severe headache, nausea, or vomiting. No LOC with initial injury. States that vision seems somewhat different than baseline. Not currently on blood thinners.    Head Injury Associated symptoms: headache        Home Medications Prior to Admission medications   Medication Sig Start Date End Date Taking? Authorizing Provider  acetic acid-hydrocortisone (VOSOL-HC) OTIC solution Place 3 drops into the left ear 2 (two) times daily. 11/29/22   Crain, Whitney L, PA  albuterol (VENTOLIN HFA) 108 (90 Base) MCG/ACT inhaler Inhale 2 puffs into the lungs every 4 (four) hours as needed for wheezing or shortness of breath (coughing fits). Patient taking differently: Inhale 2 puffs into the lungs as needed for wheezing or shortness of breath (coughing fits). 05/16/22   Ellamae Sia, DO  budesonide-formoterol (SYMBICORT) 80-4.5 MCG/ACT inhaler Inhale 2 puffs into the lungs in the morning and at bedtime. 01/18/23   Zenia Resides, MD  chlorthalidone (HYGROTON) 25 MG tablet Take 25 mg by mouth daily.  11/03/19   [provider]  Dermatological Products, Misc. [facility]) lotion Apply twice a day to the body as a moisturizer 05/16/22   Ellamae Sia, DO  fexofenadine (ALLEGRA ALLERGY) 180 MG tablet 1 tablet Orally Once a day for 30 day(s)    [provider]  fluticasone (FLONASE) 50 MCG/ACT nasal spray Place 1 spray into both nostrils daily as needed (nasal congestion). 11/29/22   Crain, Whitney L, PA   gabapentin (NEURONTIN) 300 MG capsule Take 2 capsules (600 mg total) by mouth 3 (three) times daily. 11/05/22 02/03/23  London Sheer, MD  JARDIANCE 10 MG TABS tablet Take 10 mg by mouth daily. 06/28/21   [provider]  loratadine (CLARITIN) 10 MG tablet Take 1 tablet (10 mg total) by mouth daily. 09/04/22   Ellamae Sia, DO  losartan (COZAAR) 100 MG tablet Take 1 tablet (100 mg total) by mouth daily at 10 pm. 09/18/22   Tolia, Sunit, DO  Potassium Chloride ER 20 MEQ TBCR Take 1 tablet by mouth daily. 08/09/20   [provider]  predniSONE (DELTASONE) 20 MG tablet 3 tabs daily x3 days, then 2 tabs daily x3 days, then 1 tab daily x3 days, then one half tab daily x3 days, then stop 01/18/23   Zenia Resides, MD  promethazine-dextromethorphan (PROMETHAZINE-DM) 6.25-15 MG/5ML syrup Take 5 mLs by mouth 4 (four) times daily as needed for cough. 01/18/23   Zenia Resides, MD  rosuvastatin (CRESTOR) 20 MG tablet TAKE 1 TABLET(20 MG) BY MOUTH AT BEDTIME 12/25/22   Tolia, Sunit, DO      Allergies    Hydrocodone-acetaminophen    Review of Systems   Review of Systems  Neurological:  Positive for headaches.  All other systems reviewed and are negative.   Physical Exam Updated Vital Signs BP (!) 142/80   Pulse 78   Temp 98.5 F (36.9 C) (Oral)   Resp 16  Ht 5\' 6"  (1.676 m)   Wt 89.4 kg   SpO2 99%   BMI 31.80 kg/m  Physical Exam Vitals and nursing note reviewed.  Constitutional:      General: She is not in acute distress.    Appearance: She is well-developed.  HENT:     Head: Normocephalic and atraumatic.  Eyes:     Conjunctiva/sclera: Conjunctivae normal.  Cardiovascular:     Rate and Rhythm: Normal rate and regular rhythm.     Heart sounds: No murmur heard. Pulmonary:     Effort: Pulmonary effort is normal. No respiratory distress.     Breath sounds: Normal breath sounds.  Abdominal:     Palpations: Abdomen is soft.     Tenderness: There is no abdominal  tenderness.  Musculoskeletal:        General: No swelling.     Cervical back: Neck supple.  Skin:    General: Skin is warm and dry.     Capillary Refill: Capillary refill takes less than 2 seconds.  Neurological:     General: No focal deficit present.     Mental Status: She is alert and oriented to person, place, and time. Mental status is at baseline.     Cranial Nerves: No cranial nerve deficit.     Comments: CN II-XII intact without obvious limitation  Psychiatric:        Mood and Affect: Mood normal.     ED Results / Procedures / Treatments   Labs (all labs ordered are listed, but only abnormal results are displayed) Labs Reviewed - No data to display  EKG None  Radiology CT Head Wo Contrast  Result Date: 01/28/2023 CLINICAL DATA:  Head trauma, moderate to severe. EXAM: CT HEAD WITHOUT CONTRAST TECHNIQUE: Contiguous axial images were obtained from the base of the skull through the vertex without intravenous contrast. RADIATION DOSE REDUCTION: This exam was performed according to the departmental dose-optimization program which includes automated exposure control, adjustment of the mA and/or kV according to patient size and/or use of iterative reconstruction technique. COMPARISON:  08/30/2016 FINDINGS: Brain: No evidence of acute infarction, hemorrhage, hydrocephalus, extra-axial collection or mass lesion/mass effect. Vascular: No hyperdense vessel or unexpected calcification. Skull: Normal. Negative for fracture or focal lesion. Sinuses/Orbits: No acute finding. Other: None. IMPRESSION: No acute intracranial abnormalities. Electronically Signed   By: Burman Nieves M.D.   On: 01/28/2023 17:16    Procedures Procedures   Medications Ordered in ED Medications - No data to display  ED Course/ Medical Decision Making/ A&P                           Medical Decision Making Amount and/or Complexity of Data Reviewed Radiology: ordered.   This patient presents to the ED for  concern of head injury.  Differential diagnosis includes concussion, scalp laceration, syncope   Imaging Studies ordered:  I ordered imaging studies including CT head I independently visualized and interpreted imaging which showed no acute intracranial normality I agree with the radiologist interpretation   Problem List / ED Course:  Patient presents emergency department concerns of a head injury.  Reports that she was hit in the head by a metal door while she was bent over in the bathroom.  This occurred approximately 2 days ago and patient endorses she has a mild headache since then.  No obvious bruising or lacerations noted to the scalp. Not currently on blood thinners. Denies any vision changes, light  or sound sensitivity, nausea, or vomiting. Will evaluate with CT imaging given mechanism and age. CT imaging negative for any intracranial abnormality. Likely minor head trauma given mechanism and lingering symptoms. Doubt concussion at this time, but possible. Did advise conservative management at home with concussion protocol. Also encouraged patient to manage pain with Tylenol or ibuprofen as needed/tolerated. Patient is agreeable with treatment plan and verbalized understanding all return precautions. All questions answered prior to patient discharge. Discussed strict return precautions and advised patient to return if she has any acute decline in symptoms, namely worsening headaches, intractable nausea or vomiting, or worsening light/sound sensitivity.  Final Clinical Impression(s) / ED Diagnoses Final diagnoses:  Minor head injury, initial encounter    Rx / DC Orders ED Discharge Orders     None         Salomon Mast 01/28/23 1955    Wynetta Fines, MD 01/28/23 2250

## 2023-01-28 NOTE — ED Triage Notes (Addendum)
Patient hit the top of her head on a metal door in the bathroom. No laceration. Did not fall. No blood thinners.

## 2023-02-08 ENCOUNTER — Other Ambulatory Visit: Payer: Self-pay | Admitting: *Deleted

## 2023-02-08 MED ORDER — TRIAMCINOLONE ACETONIDE 0.1 % EX OINT: 1.0000 | TOPICAL_OINTMENT | Freq: Two times a day (BID) | CUTANEOUS | 1 refills | Status: DC | PRN

## 2023-02-08 MED ORDER — ALBUTEROL SULFATE HFA 108 (90 BASE) MCG/ACT IN AERS: 2.0000 | INHALATION_SPRAY | RESPIRATORY_TRACT | 1 refills | Status: DC | PRN

## 2023-03-11 ENCOUNTER — Encounter (HOSPITAL_COMMUNITY): Payer: Self-pay

## 2023-03-11 ENCOUNTER — Emergency Department (HOSPITAL_COMMUNITY)
Admission: EM | Admit: 2023-03-11 | Discharge: 2023-03-11 | Disposition: A | Discharge status: Home / Self Care | Payer: Medicare Other | Attending: Emergency Medicine | Admitting: Emergency Medicine

## 2023-03-11 ENCOUNTER — Other Ambulatory Visit: Payer: Self-pay

## 2023-03-11 DIAGNOSIS — J45909 Unspecified asthma, uncomplicated: Secondary | ICD-10-CM | POA: Diagnosis not present

## 2023-03-11 DIAGNOSIS — G4489 Other headache syndrome: Secondary | ICD-10-CM | POA: Diagnosis not present

## 2023-03-11 DIAGNOSIS — E876 Hypokalemia: Secondary | ICD-10-CM | POA: Diagnosis not present

## 2023-03-11 DIAGNOSIS — Z1152 Encounter for screening for COVID-19: Secondary | ICD-10-CM | POA: Insufficient documentation

## 2023-03-11 DIAGNOSIS — R531 Weakness: Secondary | ICD-10-CM | POA: Diagnosis not present

## 2023-03-11 DIAGNOSIS — R42 Dizziness and giddiness: Secondary | ICD-10-CM | POA: Diagnosis not present

## 2023-03-11 LAB — COMPREHENSIVE METABOLIC PANEL
ALT: 18 U/L (ref 0–44)
AST: 15 U/L (ref 15–41)
Albumin: 2.6 g/dL — ABNORMAL LOW (ref 3.5–5.0)
Alkaline Phosphatase: 42 U/L (ref 38–126)
Anion gap: 6 (ref 5–15)
BUN: 13 mg/dL (ref 8–23)
CO2: 21 mmol/L — ABNORMAL LOW (ref 22–32)
Calcium: 7 mg/dL — ABNORMAL LOW (ref 8.9–10.3)
Chloride: 111 mmol/L (ref 98–111)
Creatinine, Ser: 1.01 mg/dL — ABNORMAL HIGH (ref 0.44–1.00)
GFR, Estimated: 58 mL/min — ABNORMAL LOW (ref >60)
Glucose, Bld: 98 mg/dL (ref 70–99)
Potassium: 2.6 mmol/L — CL (ref 3.5–5.1)
Sodium: 138 mmol/L (ref 135–145)
Total Bilirubin: 0.4 mg/dL (ref 0.3–1.2)
Total Protein: 5.4 g/dL — ABNORMAL LOW (ref 6.5–8.1)

## 2023-03-11 LAB — CBC WITH DIFFERENTIAL/PLATELET
Abs Immature Granulocytes: 0.04 10*3/uL (ref 0.00–0.07)
Basophils Absolute: 0 10*3/uL (ref 0.0–0.1)
Basophils Relative: 0 %
Eosinophils Absolute: 0.1 10*3/uL (ref 0.0–0.5)
Eosinophils Relative: 1 %
HCT: 29.2 % — ABNORMAL LOW (ref 36.0–46.0)
Hemoglobin: 9.3 g/dL — ABNORMAL LOW (ref 12.0–15.0)
Immature Granulocytes: 0 %
Lymphocytes Relative: 18 %
Lymphs Abs: 1.7 10*3/uL (ref 0.7–4.0)
MCH: 26.7 pg (ref 26.0–34.0)
MCHC: 31.8 g/dL (ref 30.0–36.0)
MCV: 83.9 fL (ref 80.0–100.0)
Monocytes Absolute: 0.9 10*3/uL (ref 0.1–1.0)
Monocytes Relative: 10 %
Neutro Abs: 6.6 10*3/uL (ref 1.7–7.7)
Neutrophils Relative %: 71 %
Platelets: 202 10*3/uL (ref 150–400)
RBC: 3.48 MIL/uL — ABNORMAL LOW (ref 3.87–5.11)
RDW: 15 % (ref 11.5–15.5)
WBC: 9.4 10*3/uL (ref 4.0–10.5)
nRBC: 0 % (ref 0.0–0.2)

## 2023-03-11 LAB — URINALYSIS, W/ REFLEX TO CULTURE (INFECTION SUSPECTED)
Bilirubin Urine: NEGATIVE
Glucose, UA: >=500 mg/dL — AB
Hgb urine dipstick: NEGATIVE
Ketones, ur: NEGATIVE mg/dL
Nitrite: NEGATIVE
Protein, ur: NEGATIVE mg/dL
Specific Gravity, Urine: 1.015 (ref 1.005–1.030)
pH: 6 (ref 5.0–8.0)

## 2023-03-11 LAB — SARS CORONAVIRUS 2 BY RT PCR: SARS Coronavirus 2 by RT PCR: NEGATIVE

## 2023-03-11 LAB — I-STAT CG4 LACTIC ACID, ED
Lactic Acid, Venous: 0.8 mmol/L (ref 0.5–1.9)
Lactic Acid, Venous: 1 mmol/L (ref 0.5–1.9)

## 2023-03-11 LAB — MAGNESIUM: Magnesium: 2 mg/dL (ref 1.7–2.4)

## 2023-03-11 MED ORDER — ACETAMINOPHEN 325 MG PO TABS
650.0000 mg | ORAL_TABLET | Freq: Once | ORAL | Status: AC | PRN
  Administered 2023-03-11: 650 mg via ORAL
  Filled 2023-03-11: qty 2

## 2023-03-11 MED ORDER — KETOROLAC TROMETHAMINE 15 MG/ML IJ SOLN
15.0000 mg | Freq: Once | INTRAMUSCULAR | Status: AC
  Administered 2023-03-11: 15 mg via INTRAVENOUS
  Filled 2023-03-11: qty 1

## 2023-03-11 MED ORDER — POTASSIUM CHLORIDE CRYS ER 20 MEQ PO TBCR
40.0000 meq | EXTENDED_RELEASE_TABLET | Freq: Once | ORAL | Status: AC
  Administered 2023-03-11: 40 meq via ORAL
  Filled 2023-03-11: qty 2

## 2023-03-11 NOTE — ED Triage Notes (Signed)
Pt BIB [facility] from work d/t generalized weakness, dizziness, HA & light headed since waking up at 0600 this morning. A/Ox4, has not missed any of her home meds. EMS denies any stroke-like s/s, pt endorses she does have chronic back pain at baseline right now. 133/54, 77 bpm, 97% RA, CBG 115, 20g Lt Ac PIV.

## 2023-03-11 NOTE — ED Notes (Signed)
EDP notified. lab called to report potasssium of 2.6

## 2023-03-11 NOTE — ED Notes (Signed)
1st lactic 0.8 @ 17:52 in normal range, 2nd not needed

## 2023-03-11 NOTE — Discharge Instructions (Signed)
You are seen today for headache and body ache.  Your COVID and flu are both negative.  Likely a different virus.  I recommend Tylenol and Motrin as needed for pain and fever.  Please keep your appointment to follow-up with your PCP.

## 2023-03-11 NOTE — ED Provider Notes (Signed)
EMERGENCY DEPARTMENT AT [facility] Provider Note   CSN: [id number] Arrival date & time: 03/11/23  1711     History  Chief Complaint  Patient presents with   Weakness   Dizziness   Headache    Rachel Crane is a 77 y.o. female.   Weakness Associated symptoms: dizziness and headaches   Dizziness Associated symptoms: headaches and weakness   Headache Associated symptoms: dizziness and weakness    Patient reports that she has been experiencing progressive weakness over the past 2 days.  Reports associated headache in the top of her head that will worsen over the course of the day and improves with rest and Tylenol.  Her symptoms worsened while at work today therefore she decided to come to the emergency department to be evaluated.  Denies chest pain, shortness of breath, abdominal pain, dysuria, rash.     Home Medications Prior to Admission medications   Medication Sig Start Date End Date Taking? Authorizing Provider  acetic acid-hydrocortisone (VOSOL-HC) OTIC solution Place 3 drops into the left ear 2 (two) times daily. 11/29/22   Crain, Whitney L, PA  albuterol (VENTOLIN HFA) 108 (90 Base) MCG/ACT inhaler Inhale 2 puffs into the lungs every 4 (four) hours as needed for wheezing or shortness of breath (coughing fits). 02/08/23   Ellamae Sia, DO  budesonide-formoterol (SYMBICORT) 80-4.5 MCG/ACT inhaler Inhale 2 puffs into the lungs in the morning and at bedtime. 01/18/23   Zenia Resides, MD  chlorthalidone (HYGROTON) 25 MG tablet Take 25 mg by mouth daily.  11/03/19   [provider]  Dermatological Products, Misc. [facility]) lotion Apply twice a day to the body as a moisturizer 05/16/22   Ellamae Sia, DO  fexofenadine (ALLEGRA ALLERGY) 180 MG tablet 1 tablet Orally Once a day for 30 day(s)    [provider]  fluticasone (FLONASE) 50 MCG/ACT nasal spray Place 1 spray into both nostrils daily as needed (nasal congestion). 11/29/22   Crain,  Whitney L, PA  gabapentin (NEURONTIN) 300 MG capsule Take 2 capsules (600 mg total) by mouth 3 (three) times daily. 11/05/22 02/03/23  London Sheer, MD  JARDIANCE 10 MG TABS tablet Take 10 mg by mouth daily. 06/28/21   [provider]  loratadine (CLARITIN) 10 MG tablet Take 1 tablet (10 mg total) by mouth daily. 09/04/22   Ellamae Sia, DO  losartan (COZAAR) 100 MG tablet Take 1 tablet (100 mg total) by mouth daily at 10 pm. 09/18/22   Tolia, Sunit, DO  Potassium Chloride ER 20 MEQ TBCR Take 1 tablet by mouth daily. 08/09/20   [provider]  predniSONE (DELTASONE) 20 MG tablet 3 tabs daily x3 days, then 2 tabs daily x3 days, then 1 tab daily x3 days, then one half tab daily x3 days, then stop 01/18/23   Zenia Resides, MD  promethazine-dextromethorphan (PROMETHAZINE-DM) 6.25-15 MG/5ML syrup Take 5 mLs by mouth 4 (four) times daily as needed for cough. 01/18/23   Zenia Resides, MD  rosuvastatin (CRESTOR) 20 MG tablet TAKE 1 TABLET(20 MG) BY MOUTH AT BEDTIME 12/25/22   Tolia, Sunit, DO  triamcinolone ointment (KENALOG) 0.1 % Apply 1 Application topically 2 (two) times daily as needed. 02/08/23   Ellamae Sia, DO      Allergies    Hydrocodone-acetaminophen    Review of Systems   Review of Systems  Neurological:  Positive for dizziness, weakness and headaches.    Physical Exam Updated Vital Signs  BP (!) 128/51 (BP Location: Right Arm)   Pulse 85   Temp (!) 100.6 F (38.1 C) (Oral)   Resp 18   Ht 5\' 6"  (1.676 m)   Wt 84.8 kg   SpO2 97%   BMI 30.18 kg/m  Physical Exam Vitals and nursing note reviewed.  Constitutional:      General: She is not in acute distress.    Appearance: She is well-developed.  HENT:     Head: Normocephalic and atraumatic.  Eyes:     Conjunctiva/sclera: Conjunctivae normal.  Cardiovascular:     Rate and Rhythm: Normal rate and regular rhythm.     Heart sounds: No murmur heard. Pulmonary:     Effort: Pulmonary effort is normal. No  respiratory distress.     Breath sounds: Normal breath sounds.  Abdominal:     Palpations: Abdomen is soft.     Tenderness: There is no abdominal tenderness.  Musculoskeletal:        General: No swelling.     Cervical back: Normal range of motion and neck supple.  Skin:    General: Skin is warm and dry.     Capillary Refill: Capillary refill takes less than 2 seconds.  Neurological:     Mental Status: She is alert.  Psychiatric:        Mood and Affect: Mood normal.     ED Results / Procedures / Treatments   Labs (all labs ordered are listed, but only abnormal results are displayed) Labs Reviewed  CBC WITH DIFFERENTIAL/PLATELET - Abnormal; Notable for the following components:      Result Value   RBC 3.48 (*)    Hemoglobin 9.3 (*)    HCT 29.2 (*)    All other components within normal limits  URINALYSIS, W/ REFLEX TO CULTURE (INFECTION SUSPECTED) - Abnormal; Notable for the following components:   Color, Urine STRAW (*)    Glucose, UA >=500 (*)    Leukocytes,Ua SMALL (*)    Bacteria, UA RARE (*)    All other components within normal limits  URINE CULTURE  COMPREHENSIVE METABOLIC PANEL  I-STAT CG4 LACTIC ACID, ED    EKG None  Radiology No results found.  Procedures Procedures    Medications Ordered in ED Medications  acetaminophen (TYLENOL) tablet 650 mg (650 mg Oral Given 03/11/23 1742)    ED Course/ Medical Decision Making/ A&P Clinical Course as of 03/11/23 2247  Mon Mar 11, 2023  1923 Alkaline Phosphatase: 42 [KH]    Clinical Course User Index [KH] Claretha Cooper, DO                                 Medical Decision Making Amount and/or Complexity of Data Reviewed Labs: ordered. Decision-making details documented in ED Course.  Risk OTC drugs. Prescription drug management.   Patient is a 77 year old female with a history of aortic stenosis, asthma presenting for weakness.  On my initial evaluation, she is febrile, hemodynamically stable, in  no acute distress.  Reports 2-day history of generalized weakness, body ache, headache.  Exam is without neurodeficit, neck stiffness, abnormal breath sounds, abdominal tenderness.  Given fever and constellation of symptoms, feel her presentation is most consistent with viral infection.  As she does have a headache, I considered meningitis and encephalitis however she is overall well-appearing and without meningeal signs or photophobia.  Considered pneumonia however feel this is less likely in absence of respiratory symptoms.  Additionally less concern for UTI given absence of diarrhea symptoms.  CBC without leukocytosis, anemia present with hemoglobin 9.3.  CMP demonstrates hypokalemia to 2.6, no AKI or anion gap.  Lactic acid WNL.  UA is noninfectious.  I personally reviewed her EKG and do not note ST elevation or depression.  Overall nonischemic.  No evidence of high-grade block.  As patient does not have leukocytosis on CBC, I have less concern for significant bacterial infection.  I did replete potassium.  Patient reports that she is on daily potassium at home.  Toradol given for pain.  Patient reassessed following the above interventions.  She does report that she has started feeling significantly better following Toradol.  Given overall reassuring presentation and workup as above, feel that patient is appropriate for discharge with outpatient follow-up.  She does have an appointment with her primary care doctor tomorrow.  I did recommend that she keep this.  Return precautions given including chest pain, shortness of breath, worsening fever, change mental status, weakness.  She reports understanding and agreement.  Discharged no further acute events under my care in the emergency department.        Final Clinical Impression(s) / ED Diagnoses Final diagnoses:  None    Rx / DC Orders ED Discharge Orders     None         Claretha Cooper, DO 03/11/23 2253    Eber Hong,  MD 03/12/23 1022

## 2023-03-12 DIAGNOSIS — I959 Hypotension, unspecified: Secondary | ICD-10-CM | POA: Diagnosis not present

## 2023-03-12 DIAGNOSIS — Z1331 Encounter for screening for depression: Secondary | ICD-10-CM | POA: Diagnosis not present

## 2023-03-12 DIAGNOSIS — H401131 Primary open-angle glaucoma, bilateral, mild stage: Secondary | ICD-10-CM | POA: Diagnosis not present

## 2023-03-12 DIAGNOSIS — D638 Anemia in other chronic diseases classified elsewhere: Secondary | ICD-10-CM | POA: Diagnosis not present

## 2023-03-12 DIAGNOSIS — M1712 Unilateral primary osteoarthritis, left knee: Secondary | ICD-10-CM | POA: Diagnosis not present

## 2023-03-12 DIAGNOSIS — E538 Deficiency of other specified B group vitamins: Secondary | ICD-10-CM | POA: Diagnosis not present

## 2023-03-12 DIAGNOSIS — I10 Essential (primary) hypertension: Secondary | ICD-10-CM | POA: Diagnosis not present

## 2023-03-12 DIAGNOSIS — R42 Dizziness and giddiness: Secondary | ICD-10-CM | POA: Diagnosis not present

## 2023-03-12 DIAGNOSIS — Z0000 Encounter for general adult medical examination without abnormal findings: Secondary | ICD-10-CM | POA: Diagnosis not present

## 2023-03-12 DIAGNOSIS — E1169 Type 2 diabetes mellitus with other specified complication: Secondary | ICD-10-CM | POA: Diagnosis not present

## 2023-03-12 DIAGNOSIS — E876 Hypokalemia: Secondary | ICD-10-CM | POA: Diagnosis not present

## 2023-03-12 LAB — URINE CULTURE

## 2023-03-21 DIAGNOSIS — E876 Hypokalemia: Secondary | ICD-10-CM | POA: Diagnosis not present

## 2023-03-21 DIAGNOSIS — I10 Essential (primary) hypertension: Secondary | ICD-10-CM | POA: Diagnosis not present

## 2023-03-25 DIAGNOSIS — M5416 Radiculopathy, lumbar region: Secondary | ICD-10-CM | POA: Diagnosis not present

## 2023-04-15 ENCOUNTER — Other Ambulatory Visit: Payer: Self-pay | Admitting: Orthopedic Surgery

## 2023-04-16 ENCOUNTER — Other Ambulatory Visit: Payer: Self-pay

## 2023-04-16 DIAGNOSIS — E782 Mixed hyperlipidemia: Secondary | ICD-10-CM

## 2023-04-16 MED ORDER — ROSUVASTATIN CALCIUM 20 MG PO TABS: 20.0000 mg | ORAL_TABLET | Freq: Every day | ORAL | 1 refills | Status: DC

## 2023-04-17 DIAGNOSIS — E1169 Type 2 diabetes mellitus with other specified complication: Secondary | ICD-10-CM | POA: Diagnosis not present

## 2023-04-17 DIAGNOSIS — M5442 Lumbago with sciatica, left side: Secondary | ICD-10-CM | POA: Diagnosis not present

## 2023-04-17 DIAGNOSIS — D638 Anemia in other chronic diseases classified elsewhere: Secondary | ICD-10-CM | POA: Diagnosis not present

## 2023-04-17 DIAGNOSIS — I10 Essential (primary) hypertension: Secondary | ICD-10-CM | POA: Diagnosis not present

## 2023-04-22 NOTE — Pre-Procedure Instructions (Signed)
Surgical Instructions   Your procedure is scheduled on May 02, 2023. Report to [facility] Main Entrance "A" at 11:00 A.M., then check in with the Admitting office. Any questions or running late day of surgery: call [phone]  Questions prior to your surgery date: call [phone], Monday-Friday, 8am-4pm. If you experience any cold or flu symptoms such as cough, fever, chills, shortness of breath, etc. between now and your scheduled surgery, please notify us at the above number.     Remember:  Do not eat after midnight the night before your surgery  You may drink clear liquids until 11:00 AM the morning of your surgery.   Clear liquids allowed are: Water, Non-Citrus Juices (without pulp), Carbonated Beverages, Clear Tea, Black Coffee Only (NO MILK, CREAM OR POWDERED CREAMER of any kind), and Gatorade.  Patient Instructions  The night before surgery:  No food after midnight. ONLY clear liquids after midnight  The day of surgery (if you do NOT have diabetes):  Drink ONE (1) Pre-Surgery Clear Ensure by 11:00 AM the morning of surgery. Drink in one sitting. Do not sip.  This drink was given to you during your hospital  pre-op appointment visit.  Nothing else to drink after completing the  Pre-Surgery Clear Ensure.         If you have questions, please contact your surgeon's office.     Take these medicines the morning of surgery with A SIP OF WATER: gabapentin (NEURONTIN)    May take these medicines IF NEEDED: albuterol (VENTOLIN HFA) inhaler  budesonide-formoterol (SYMBICORT) inhaler  carboxymethylcellulose (REFRESH PLUS) eye drops fexofenadine (ALLEGRA ALLERGY)  fluticasone (FLONASE) nasal spray  loratadine (CLARITIN)  methocarbamol (ROBAXIN)  traMADol (ULTRAM)    STOP taking your empagliflozin (JARDIANCE) three days prior to surgery. Your last dose will be October 13th.     One week prior to surgery, STOP taking any Aspirin (unless otherwise instructed by  your surgeon) Aleve, Naproxen, Ibuprofen, Motrin, Advil, Goody's, BC's, all herbal medications, fish oil, and non-prescription vitamins.   HOW TO MANAGE YOUR DIABETES BEFORE AND AFTER SURGERY  Why is it important to control my blood sugar before and after surgery? Improving blood sugar levels before and after surgery helps healing and can limit problems. A way of improving blood sugar control is eating a healthy diet by:  Eating less sugar and carbohydrates  Increasing activity/exercise  Talking with your doctor about reaching your blood sugar goals High blood sugars (greater than 180 mg/dL) can raise your risk of infections and slow your recovery, so you will need to focus on controlling your diabetes during the weeks before surgery. Make sure that the doctor who takes care of your diabetes knows about your planned surgery including the date and location.  How do I manage my blood sugar before surgery? Check your blood sugar at least 4 times a day, starting 2 days before surgery, to make sure that the level is not too high or low.  Check your blood sugar the morning of your surgery when you wake up and every 2 hours until you get to the Short Stay unit.  If your blood sugar is less than 70 mg/dL, you will need to treat for low blood sugar: Do not take insulin. Treat a low blood sugar (less than 70 mg/dL) with  cup of clear juice (cranberry or apple), 4 glucose tablets, OR glucose gel. Recheck blood sugar in 15 minutes after treatment (to make sure it is greater than 70 mg/dL). If your blood  sugar is not greater than 70 mg/dL on recheck, call [phone] for further instructions. Report your blood sugar to the short stay nurse when you get to Short Stay.  If you are admitted to the hospital after surgery: Your blood sugar will be checked by the staff and you will probably be given insulin after surgery (instead of oral diabetes medicines) to make sure you have good blood sugar  levels. The goal for blood sugar control after surgery is 80-180 mg/dL.                      Do NOT Smoke (Tobacco/Vaping) for 24 hours prior to your procedure.  If you use a CPAP at night, you may bring your mask/headgear for your overnight stay.   You will be asked to remove any contacts, glasses, piercing's, hearing aid's, dentures/partials prior to surgery. Please bring cases for these items if needed.    Patients discharged the day of surgery will not be allowed to drive home, and someone needs to stay with them for 24 hours.  SURGICAL WAITING ROOM VISITATION Patients may have no more than 2 support people in the waiting area - these visitors may rotate.   Pre-op nurse will coordinate an appropriate time for 1 ADULT support person, who may not rotate, to accompany patient in pre-op.  Children under the age of 76 must have an adult with them who is not the patient and must remain in the main waiting area with an adult.  If the patient needs to stay at the hospital during part of their recovery, the visitor guidelines for inpatient rooms apply.  Please refer to the [facility] website for the visitor guidelines for any additional information.   If you received a COVID test during your pre-op visit  it is requested that you wear a mask when out in public, stay away from anyone that may not be feeling well and notify your surgeon if you develop symptoms. If you have been in contact with anyone that has tested positive in the last 10 days please notify you surgeon.      Pre-operative 5 CHG Bathing Instructions   You can play a key role in reducing the risk of infection after surgery. Your skin needs to be as free of germs as possible. You can reduce the number of germs on your skin by washing with CHG (chlorhexidine gluconate) soap before surgery. CHG is an antiseptic soap that kills germs and continues to kill germs even after washing.   DO NOT use if you have an allergy to  chlorhexidine/CHG or antibacterial soaps. If your skin becomes reddened or irritated, stop using the CHG and notify one of our RNs at [phone].   Please shower with the CHG soap starting 4 days before surgery using the following schedule:     Please keep in mind the following:  DO NOT shave, including legs and underarms, starting the day of your first shower.   You may shave your face at any point before/day of surgery.  Place clean sheets on your bed the day you start using CHG soap. Use a clean washcloth (not used since being washed) for each shower. DO NOT sleep with pets once you start using the CHG.   CHG Shower Instructions:  Wash your face and private area with normal soap. If you choose to wash your hair, wash first with your normal shampoo.  After you use shampoo/soap, rinse your hair and body thoroughly to  remove shampoo/soap residue.  Turn the water OFF and apply about 3 tablespoons (45 ml) of CHG soap to a CLEAN washcloth.  Apply CHG soap ONLY FROM YOUR NECK DOWN TO YOUR TOES (washing for 3-5 minutes)  DO NOT use CHG soap on face, private areas, open wounds, or sores.  Pay special attention to the area where your surgery is being performed.  If you are having back surgery, having someone wash your back for you may be helpful. Wait 2 minutes after CHG soap is applied, then you may rinse off the CHG soap.  Pat dry with a clean towel  Put on clean clothes/pajamas   If you choose to wear lotion, please use ONLY the CHG-compatible lotions on the back of this paper.   Additional instructions for the day of surgery: DO NOT APPLY any lotions, deodorants, cologne, or perfumes.   Do not bring valuables to the hospital. [facility] is not responsible for any belongings/valuables. Do not wear nail polish, gel polish, artificial nails, or any other type of covering on natural nails (fingers and toes) Do not wear jewelry or makeup Put on clean/comfortable clothes.  Please brush your  teeth.  Ask your nurse before applying any prescription medications to the skin.     CHG Compatible Lotions   Aveeno Moisturizing lotion  Cetaphil Moisturizing Cream  Cetaphil Moisturizing Lotion  Clairol Herbal Essence Moisturizing Lotion, Dry Skin  Clairol Herbal Essence Moisturizing Lotion, Extra Dry Skin  Clairol Herbal Essence Moisturizing Lotion, Normal Skin  Curel Age Defying Therapeutic Moisturizing Lotion with Alpha Hydroxy  Curel Extreme Care Body Lotion  Curel Soothing Hands Moisturizing Hand Lotion  Curel Therapeutic Moisturizing Cream, Fragrance-Free  Curel Therapeutic Moisturizing Lotion, Fragrance-Free  Curel Therapeutic Moisturizing Lotion, Original Formula  Eucerin Daily Replenishing Lotion  Eucerin Dry Skin Therapy Plus Alpha Hydroxy Crme  Eucerin Dry Skin Therapy Plus Alpha Hydroxy Lotion  Eucerin Original Crme  Eucerin Original Lotion  Eucerin Plus Crme Eucerin Plus Lotion  Eucerin TriLipid Replenishing Lotion  Keri Anti-Bacterial Hand Lotion  Keri Deep Conditioning Original Lotion Dry Skin Formula Softly Scented  Keri Deep Conditioning Original Lotion, Fragrance Free Sensitive Skin Formula  Keri Lotion Fast Absorbing Fragrance Free Sensitive Skin Formula  Keri Lotion Fast Absorbing Softly Scented Dry Skin Formula  Keri Original Lotion  Keri Skin Renewal Lotion Keri Silky Smooth Lotion  Keri Silky Smooth Sensitive Skin Lotion  Nivea Body Creamy Conditioning Oil  Nivea Body Extra Enriched Lotion  Nivea Body Original Lotion  Nivea Body Sheer Moisturizing Lotion Nivea Crme  Nivea Skin Firming Lotion  NutraDerm 30 Skin Lotion  NutraDerm Skin Lotion  NutraDerm Therapeutic Skin Cream  NutraDerm Therapeutic Skin Lotion  ProShield Protective Hand Cream  Provon moisturizing lotion  Please read over the following fact sheets that you were given.

## 2023-04-23 ENCOUNTER — Encounter (HOSPITAL_COMMUNITY): Payer: Self-pay

## 2023-04-23 ENCOUNTER — Encounter (HOSPITAL_COMMUNITY)
Admission: RE | Admit: 2023-04-23 | Discharge: 2023-04-23 | Disposition: A | Discharge status: Home / Self Care | Payer: Medicare Other | Source: Ambulatory Visit | Attending: Orthopedic Surgery | Admitting: Orthopedic Surgery

## 2023-04-23 ENCOUNTER — Other Ambulatory Visit: Payer: Self-pay

## 2023-04-23 VITALS — BP 134/62 | HR 65 | Temp 98.5°F | Resp 18 | Ht 66.0 in | Wt 197.6 lb

## 2023-04-23 DIAGNOSIS — J45909 Unspecified asthma, uncomplicated: Secondary | ICD-10-CM | POA: Insufficient documentation

## 2023-04-23 DIAGNOSIS — E669 Obesity, unspecified: Secondary | ICD-10-CM | POA: Diagnosis not present

## 2023-04-23 DIAGNOSIS — E119 Type 2 diabetes mellitus without complications: Secondary | ICD-10-CM | POA: Diagnosis not present

## 2023-04-23 DIAGNOSIS — M48061 Spinal stenosis, lumbar region without neurogenic claudication: Secondary | ICD-10-CM | POA: Insufficient documentation

## 2023-04-23 DIAGNOSIS — M1990 Unspecified osteoarthritis, unspecified site: Secondary | ICD-10-CM | POA: Diagnosis not present

## 2023-04-23 DIAGNOSIS — Z01812 Encounter for preprocedural laboratory examination: Secondary | ICD-10-CM | POA: Insufficient documentation

## 2023-04-23 DIAGNOSIS — G4733 Obstructive sleep apnea (adult) (pediatric): Secondary | ICD-10-CM | POA: Diagnosis not present

## 2023-04-23 DIAGNOSIS — I10 Essential (primary) hypertension: Secondary | ICD-10-CM | POA: Diagnosis not present

## 2023-04-23 DIAGNOSIS — E785 Hyperlipidemia, unspecified: Secondary | ICD-10-CM | POA: Insufficient documentation

## 2023-04-23 DIAGNOSIS — Z96653 Presence of artificial knee joint, bilateral: Secondary | ICD-10-CM | POA: Insufficient documentation

## 2023-04-23 DIAGNOSIS — I2510 Atherosclerotic heart disease of native coronary artery without angina pectoris: Secondary | ICD-10-CM | POA: Diagnosis not present

## 2023-04-23 DIAGNOSIS — Z01818 Encounter for other preprocedural examination: Secondary | ICD-10-CM

## 2023-04-23 LAB — BASIC METABOLIC PANEL
Anion gap: 10 (ref 5–15)
BUN: 15 mg/dL (ref 8–23)
CO2: 25 mmol/L (ref 22–32)
Calcium: 9.1 mg/dL (ref 8.9–10.3)
Chloride: 102 mmol/L (ref 98–111)
Creatinine, Ser: 0.89 mg/dL (ref 0.44–1.00)
GFR, Estimated: >60 mL/min (ref >60)
Glucose, Bld: 114 mg/dL — ABNORMAL HIGH (ref 70–99)
Potassium: 3.7 mmol/L (ref 3.5–5.1)
Sodium: 137 mmol/L (ref 135–145)

## 2023-04-23 LAB — CBC
HCT: 36.1 % (ref 36.0–46.0)
Hemoglobin: 11.3 g/dL — ABNORMAL LOW (ref 12.0–15.0)
MCH: 26.3 pg (ref 26.0–34.0)
MCHC: 31.3 g/dL (ref 30.0–36.0)
MCV: 84 fL (ref 80.0–100.0)
Platelets: 235 10*3/uL (ref 150–400)
RBC: 4.3 MIL/uL (ref 3.87–5.11)
RDW: 15.2 % (ref 11.5–15.5)
WBC: 6.4 10*3/uL (ref 4.0–10.5)
nRBC: 0 % (ref 0.0–0.2)

## 2023-04-23 LAB — SURGICAL PCR SCREEN
MRSA, PCR: NEGATIVE
Staphylococcus aureus: NEGATIVE

## 2023-04-23 LAB — TYPE AND SCREEN
ABO/RH(D): B POS
Antibody Screen: NEGATIVE

## 2023-04-23 LAB — GLUCOSE, CAPILLARY: Glucose-Capillary: 127 mg/dL — ABNORMAL HIGH (ref 70–99)

## 2023-04-23 NOTE — Progress Notes (Signed)
PCP - Lorenda Ishihara, MD Cardiologist -Tessa Lerner, DO   PPM/ICD - denies Device Orders - n/a Rep Notified - n/a  Chest x-ray - 01/18/2023 EKG - 03/11/2023 Stress Test - 05/02/2020 ECHO - 05/02/2020 Cardiac Cath - denies  Sleep Study - yes CPAP - yes  Fasting Blood Sugar - per pt she has pre-diabetes. CBG 127 at PAT  Checks Blood Sugar once a day; per pt it's in 90.   Last dose of GLP1 agonist- n/a   GLP1 instructions: n/a  Blood Thinner Instructions: n/a Aspirin Instructions: n/a  ERAS Protcol - yes, till 10 am PRE-SURGERY Ensure or G2- yes  COVID TEST- n/a   Anesthesia review: yes (cardiac history)  Patient denies shortness of breath, fever, cough and chest pain at PAT appointment   All instructions explained to the patient, with a verbal understanding of the material. Patient agrees to go over the instructions while at home for a better understanding. Patient also instructed to self quarantine after being tested for COVID-19. The opportunity to ask questions was provided.

## 2023-04-23 NOTE — Pre-Procedure Instructions (Signed)
Surgical Instructions     Your procedure is scheduled on May 02, 2023. Report to [facility] Main Entrance "A" at 11:00 A.M., then check in with the Admitting office. Any questions or running late day of surgery: call [phone]   Questions prior to your surgery date: call [phone], Monday-Friday, 8am-4pm. If you experience any cold or flu symptoms such as cough, fever, chills, shortness of breath, etc. between now and your scheduled surgery, please notify us at the above number.            Remember:       Do not eat after midnight the night before your surgery   You may drink clear liquids until 11:00 AM the morning of your surgery.   Clear liquids allowed are: Water, Non-Citrus Juices (without pulp), Carbonated Beverages, Clear Tea, Black Coffee Only (NO MILK, CREAM OR POWDERED CREAMER of any kind), and Gatorade.   Patient Instructions   The night before surgery:  No food after midnight. ONLY clear liquids after midnight   The day of surgery (if you do NOT have diabetes):  Drink ONE (1) Pre-Surgery Clear Ensure by 11:00 AM the morning of surgery. Drink in one sitting. Do not sip.  This drink was given to you during your hospital  pre-op appointment visit.   Nothing else to drink after completing the  Pre-Surgery Clear Ensure.          If you have questions, please contact your surgeon's office.            Take these medicines the morning of surgery with A SIP OF WATER: gabapentin (NEURONTIN)      May take these medicines IF NEEDED: albuterol (VENTOLIN HFA) inhaler  budesonide-formoterol (SYMBICORT) inhaler  carboxymethylcellulose (REFRESH PLUS) eye drops fexofenadine (ALLEGRA ALLERGY)  fluticasone (FLONASE) nasal spray  loratadine (CLARITIN)  methocarbamol (ROBAXIN)  traMADol (ULTRAM)      STOP taking your empagliflozin (JARDIANCE) three days prior to surgery. Your last dose will be October 13th.         One week prior to surgery, STOP taking any Aspirin  (unless otherwise instructed by your surgeon) Aleve, Naproxen, Ibuprofen, Motrin, Advil, Goody's, BC's, all herbal medications, fish oil, and non-prescription vitamins.                    Do NOT Smoke (Tobacco/Vaping) for 24 hours prior to your procedure.   If you use a CPAP at night, you may bring your mask/headgear for your overnight stay.   You will be asked to remove any contacts, glasses, piercing's, hearing aid's, dentures/partials prior to surgery. Please bring cases for these items if needed.    Patients discharged the day of surgery will not be allowed to drive home, and someone needs to stay with them for 24 hours.   SURGICAL WAITING ROOM VISITATION Patients may have no more than 2 support people in the waiting area - these visitors may rotate.   Pre-op nurse will coordinate an appropriate time for 1 ADULT support person, who may not rotate, to accompany patient in pre-op.  Children under the age of 45 must have an adult with them who is not the patient and must remain in the main waiting area with an adult.   If the patient needs to stay at the hospital during part of their recovery, the visitor guidelines for inpatient rooms apply.   Please refer to the [facility] website for the visitor guidelines for any additional information.  If you received a COVID test during your pre-op visit  it is requested that you wear a mask when out in public, stay away from anyone that may not be feeling well and notify your surgeon if you develop symptoms. If you have been in contact with anyone that has tested positive in the last 10 days please notify you surgeon.         Pre-operative 5 CHG Bathing Instructions    You can play a key role in reducing the risk of infection after surgery. Your skin needs to be as free of germs as possible. You can reduce the number of germs on your skin by washing with CHG (chlorhexidine gluconate) soap before surgery. CHG is an antiseptic soap that kills  germs and continues to kill germs even after washing.    DO NOT use if you have an allergy to chlorhexidine/CHG or antibacterial soaps. If your skin becomes reddened or irritated, stop using the CHG and notify one of our RNs at [phone].    Please shower with the CHG soap starting 4 days before surgery using the following schedule:       Please keep in mind the following:  DO NOT shave, including legs and underarms, starting the day of your first shower.   You may shave your face at any point before/day of surgery.  Place clean sheets on your bed the day you start using CHG soap. Use a clean washcloth (not used since being washed) for each shower. DO NOT sleep with pets once you start using the CHG.    CHG Shower Instructions:  Wash your face and private area with normal soap. If you choose to wash your hair, wash first with your normal shampoo.  After you use shampoo/soap, rinse your hair and body thoroughly to remove shampoo/soap residue.  Turn the water OFF and apply about 3 tablespoons (45 ml) of CHG soap to a CLEAN washcloth.  Apply CHG soap ONLY FROM YOUR NECK DOWN TO YOUR TOES (washing for 3-5 minutes)  DO NOT use CHG soap on face, private areas, open wounds, or sores.  Pay special attention to the area where your surgery is being performed.  If you are having back surgery, having someone wash your back for you may be helpful. Wait 2 minutes after CHG soap is applied, then you may rinse off the CHG soap.  Pat dry with a clean towel  Put on clean clothes/pajamas   If you choose to wear lotion, please use ONLY the CHG-compatible lotions on the back of this paper.    Additional instructions for the day of surgery: DO NOT APPLY any lotions, deodorants, cologne, or perfumes.   Do not bring valuables to the hospital. [facility] is not responsible for any belongings/valuables. Do not wear nail polish, gel polish, artificial nails, or any other type of covering on natural nails  (fingers and toes) Do not wear jewelry or makeup Put on clean/comfortable clothes.  Please brush your teeth.  Ask your nurse before applying any prescription medications to the skin.        CHG Compatible Lotions    Aveeno Moisturizing lotion  Cetaphil Moisturizing Cream  Cetaphil Moisturizing Lotion  Clairol Herbal Essence Moisturizing Lotion, Dry Skin  Clairol Herbal Essence Moisturizing Lotion, Extra Dry Skin  Clairol Herbal Essence Moisturizing Lotion, Normal Skin  Curel Age Defying Therapeutic Moisturizing Lotion with Alpha Hydroxy  Curel Extreme Care Body Lotion  Curel Soothing Hands Moisturizing Hand Lotion  Curel Therapeutic  Moisturizing Cream, Fragrance-Free  Curel Therapeutic Moisturizing Lotion, Fragrance-Free  Curel Therapeutic Moisturizing Lotion, Original Formula  Eucerin Daily Replenishing Lotion  Eucerin Dry Skin Therapy Plus Alpha Hydroxy Crme  Eucerin Dry Skin Therapy Plus Alpha Hydroxy Lotion  Eucerin Original Crme  Eucerin Original Lotion  Eucerin Plus Crme Eucerin Plus Lotion  Eucerin TriLipid Replenishing Lotion  Keri Anti-Bacterial Hand Lotion  Keri Deep Conditioning Original Lotion Dry Skin Formula Softly Scented  Keri Deep Conditioning Original Lotion, Fragrance Free Sensitive Skin Formula  Keri Lotion Fast Absorbing Fragrance Free Sensitive Skin Formula  Keri Lotion Fast Absorbing Softly Scented Dry Skin Formula  Keri Original Lotion  Keri Skin Renewal Lotion Keri Silky Smooth Lotion  Keri Silky Smooth Sensitive Skin Lotion  Nivea Body Creamy Conditioning Oil  Nivea Body Extra Enriched Lotion  Nivea Body Original Lotion  Nivea Body Sheer Moisturizing Lotion Nivea Crme  Nivea Skin Firming Lotion  NutraDerm 30 Skin Lotion  NutraDerm Skin Lotion  NutraDerm Therapeutic Skin Cream  NutraDerm Therapeutic Skin Lotion  ProShield Protective Hand Cream  Provon moisturizing lotion   Please read over the following fact sheets that you were given.

## 2023-04-24 NOTE — Progress Notes (Addendum)
Anesthesia Chart Review:   Case: [id number] Date/Time: 05/02/23 1345   Procedure: LEFT-SIDED LUMBAR 3- LUMBAR 4 TRANSFORAMINAL LUMBAR INTERBODY FUSION AND DECOMPRESSION WITH INSTRUMENTATION AND ALLOGRAFT (Left)   Anesthesia type: General   Pre-op diagnosis: Severe spinal stenosis   [facility] ROOM 05 / [facility]   Surgeons: Estill Bamberg, MD       DISCUSSION: Patient is a 77 year old female scheduled for the above procedure.  History includes never smoker, HTN, OSA (uses CPAP), DM2, AV sclerosis (not noted on 01/17/17 echo), carotid bruits (mild plaque without stenosis 01/09/17 [place]), abnormal EKG (non-specific and/or anterior T wave inversion; low risk stress test 2018, 04/2020), asthma, osteoarthritis (right TKA 09/19/17; left TKA 06/06/20). BMI is consistent with obesity.   She is followed by cardiologist Dr. Odis Hollingshead primarily for cardiovascular risk factors of HTN, HLD, OSA, DM2. Last visit was on 09/18/22 for routine annual follow-up. She had an overall low risk stress test on 05/02/20 as part of a preoperative evaluation before left TKA. No additional testing ordered. Echo on 04/28/20 showed moderate LVH, EF 64%, grade 1 diastolic dysfunction, mild TR. She was walking 6 blocks at least 3 times a week. She denied CV/HF symptoms. He spaced out her chlorthalidone and losartan to avoid soft afternoon readings. He recommended changing pravastatin to Crestor for tighter LDL control. One year follow-up with plans to update echo prior to March 2025 visit.    A1c 7.2% on 03/14/23 at [facility] (see Care Everywhere), down from 7.3% on 08/23/22. To hold Jardiance after 04/28/23 for surgery.   Anesthesia team to evaluate on the day of surgery.    VS: BP 134/62   Pulse 65   Temp 36.9 C   Resp 18   Ht 5\' 6"  (1.676 m)   Wt 89.6 kg   SpO2 100%   BMI 31.89 kg/m    PROVIDERS: Lorenda Ishihara, MD is PCP  - Tessa Lerner, DO is cardiologist [facility] now [facility]) Sheliah Hatch, MD is rheumatologist. Evaluated 05/24/20 for positive ANA. He wrote, "She had joint aspiration showing noninflammatory synovial fluid and ENA serology was negative. Knee effusion was negative for MSU crystals so while she does have elevated uric acid I do not think gout was causing the current effusion and pain episode. Based on this assessment her knee pain and effusion is consistent with osteoarthritis..." As needed follow-up. Margo Aye, MD is allergist/immunologist - Huston Foley, MD is neurologist (OSA)   LABS: Labs reviewed: Acceptable for surgery. AST 15, ALT 18 on 03/11/23. A1c 7.2% on 03/14/23 at [facility] (see Care Everywhere). (all labs ordered are listed, but only abnormal results are displayed)  Labs Reviewed  GLUCOSE, CAPILLARY - Abnormal; Notable for the following components:      Result Value   Glucose-Capillary 127 (*)    All other components within normal limits  BASIC METABOLIC PANEL - Abnormal; Notable for the following components:   Glucose, Bld 114 (*)    All other components within normal limits  CBC - Abnormal; Notable for the following components:   Hemoglobin 11.3 (*)    All other components within normal limits  SURGICAL PCR SCREEN  TYPE AND SCREEN    Spirometry 01/07/20: "FEV1: 1.42L 72%, FVC: 2.47L 97%, ratio consistent with nonobstructive pattern for age"     IMAGES: CT Head 01/28/23: IMPRESSION: No acute intracranial abnormalities.  CXR 01/18/23: FINDINGS: - Lungs are clear. - Heart size and mediastinal contours are within normal limits. Aortic Atherosclerosis (ICD10-170.0). -  No effusion. - Cholecystectomy clips. IMPRESSION: No acute cardiopulmonary disease.   CT L-spine 10/15/22: IMPRESSION: 1. Unchanged multilevel lumbar spondylosis, worst at L3-L4 where there is moderate-to-severe spinal canal stenosis and moderate bilateral neural foraminal narrowing. 2. Unchanged moderate spinal canal stenosis at L2-L3. 3.  Unchanged mild spinal canal stenosis at L4-L5. 4. Aortic Atherosclerosis (ICD10-I70.0).    EKG:  EKG 03/11/23: Normal sinus rhythm Nonspecific T wave abnormality  EKG 09/18/22: Sinus rhythm, 63 bpm, nonspecific T wave abnormality    CV: Lexiscan Tetrofosmin stress test 05/02/2020: Lexiscan nuclear stress test performed using 1-day protocol. SPECT images show large areas of breast attenuation, with imaging performed in sitting position. There is small area of mild intensity decrease in tracer uptake, in basal inferolateral myocardium, more prominent on stress images. While attenuation artifact is likely, small area of ischemia in this region cannot be excluded.  Stress LVEF 76%. Low risk study.      Echocardiogram 04/28/2020:  Left ventricle cavity is normal in size. Moderate concentric hypertrophy  of the left ventricle. Normal global wall motion. Normal LV systolic  function with EF 64%. Doppler evidence of grade I (impaired) diastolic  dysfunction, normal LAP.  Left atrial cavity is mildly dilated. Aneurysmal interatrial septum  without 2D or color Doppler evidence of interatrial shunt.  Mild tricuspid regurgitation. Peak RA-RV gradient 17 mmHg. The IVC is not  well visualized.  No significant change compared to previous study in 2018.      Event monitor 2 weeks placed 10/06/2018: No symptoms reported. Normal sinus rhythm. Minimum heart rate 54 bpm at 12:17 PM and maximum heart rate 97 bpm. no other arrhythmias. Monitor worn for 3 hours.     Carotid duplex 01/09/2017 [facility]; scanned under Media tab): 1.  There is mild soft plaque in bilateral carotid arteries without stenosis. 2.  Antegrade bilateral vertebral artery flow.   Past Medical History:  Diagnosis Date   Abnormal stress ECG 10/06/2018   Aortic valve sclerosis 10/06/2018   Arthritis    Asthma    Bilateral carotid bruits 10/06/2018   Headache    H/O bad HA, since using CPAP- she has had improvement  with that issue    Hypertension    Laboratory examination 10/06/2018   OSA on CPAP    settting - 8, uses CPAP q night    Pre-diabetes    Shingles     Past Surgical History:  Procedure Laterality Date   CHOLECYSTECTOMY  1998   TOTAL KNEE ARTHROPLASTY Right 09/19/2017   Procedure: RIGHT TOTAL KNEE ARTHROPLASTY;  Surgeon: Tarry Kos, MD;  Location: [facility] OR;  Service: Orthopedics;  Laterality: Right;   TOTAL KNEE ARTHROPLASTY Left 06/06/2020   Procedure: LEFT TOTAL KNEE ARTHROPLASTY;  Surgeon: Tarry Kos, MD;  Location: [facility] OR;  Service: Orthopedics;  Laterality: Left;   TUBAL LIGATION      MEDICATIONS:  acetic acid-hydrocortisone (VOSOL-HC) OTIC solution   albuterol (VENTOLIN HFA) 108 (90 Base) MCG/ACT inhaler   budesonide-formoterol (SYMBICORT) 80-4.5 MCG/ACT inhaler   carboxymethylcellulose (REFRESH PLUS) 0.5 % SOLN   Dermatological Products, Misc. (EPICERAM) lotion   empagliflozin (JARDIANCE) 25 MG TABS tablet   fexofenadine (ALLEGRA ALLERGY) 180 MG tablet   fluticasone (FLONASE) 50 MCG/ACT nasal spray   gabapentin (NEURONTIN) 300 MG capsule   latanoprost (XALATAN) 0.005 % ophthalmic solution   loratadine (CLARITIN) 10 MG tablet   losartan (COZAAR) 100 MG tablet   methocarbamol (ROBAXIN) 500 MG tablet   Potassium Chloride ER 20 MEQ TBCR  predniSONE (DELTASONE) 20 MG tablet   promethazine-dextromethorphan (PROMETHAZINE-DM) 6.25-15 MG/5ML syrup   rosuvastatin (CRESTOR) 20 MG tablet   traMADol (ULTRAM) 50 MG tablet   triamcinolone ointment (KENALOG) 0.1 %   No current facility-administered medications for this encounter.  She is not currently taking Vosol-HC, KCl, or prednisone taper.    Shonna Chock, PA-C Surgical Short Stay/Anesthesiology [facility] Phone [phone] [facility] Phone [phone] 04/24/2023 12:03 PM

## 2023-04-24 NOTE — Anesthesia Preprocedure Evaluation (Addendum)
Anesthesia Evaluation  Patient identified by MRN, date of birth, ID band Patient awake    Reviewed: Allergy & Precautions, NPO status , Patient's Chart, lab work & pertinent test results  History of Anesthesia Complications Negative for: history of anesthetic complications  Airway Mallampati: II  TM Distance: >3 FB Neck ROM: Full    Dental  (+) Dental Advisory Given, Missing   Pulmonary asthma , sleep apnea and Continuous Positive Airway Pressure Ventilation , COPD,  COPD inhaler   breath sounds clear to auscultation       Cardiovascular hypertension, Pt. on medications  Rhythm:Regular Rate:Normal  '21 stress: small area of mild intensity decrease in tracer uptake, in basal inferolateral myocardium, more prominent on stress images. While attenuation artifact is likely, small area of ischemia in this region cannot be excluded.  Stress LVEF 76%  '21 ECHO: Left ventricle cavity is normal in size. Moderate concentric LVH. Normal global wall motion. Normal LV systolic function with EF 64%.  Doppler evidence of grade I (impaired) diastolic dysfunction, normal LAP.  Left atrial cavity is mildly dilated. Aneurysmal interatrial septum without 2D or color Doppler evidence of interatrial shunt.  Mild tricuspid regurgitation     Neuro/Psych    GI/Hepatic negative GI ROS, Neg liver ROS,,,  Endo/Other  negative endocrine ROS    Renal/GU negative Renal ROS     Musculoskeletal   Abdominal   Peds  Hematology negative hematology ROS (+)   Anesthesia Other Findings   Reproductive/Obstetrics                              Anesthesia Physical Anesthesia Plan  ASA: 3  Anesthesia Plan: General   Post-op Pain Management: Tylenol PO (pre-op)*   Induction: Intravenous  PONV Risk Score and Plan: 3 and Ondansetron, Dexamethasone and Treatment may vary due to age or medical condition  Airway Management  Planned: Oral ETT  Additional Equipment: None  Intra-op Plan:   Post-operative Plan: Extubation in OR  Informed Consent: I have reviewed the patients History and Physical, chart, labs and discussed the procedure including the risks, benefits and alternatives for the proposed anesthesia with the patient or authorized representative who has indicated his/her understanding and acceptance.     Dental advisory given  Plan Discussed with: CRNA and Surgeon  Anesthesia Plan Comments: (PAT note written 04/24/2023 by Shonna Chock, PA-C.  )        Anesthesia Quick Evaluation

## 2023-05-02 ENCOUNTER — Ambulatory Visit (HOSPITAL_COMMUNITY): Payer: Medicare Other

## 2023-05-02 ENCOUNTER — Encounter (HOSPITAL_COMMUNITY)
Admission: AD | Disposition: A | Discharge status: Home Health | Payer: Self-pay | Source: Home / Self Care | Attending: Orthopedic Surgery

## 2023-05-02 ENCOUNTER — Inpatient Hospital Stay (HOSPITAL_COMMUNITY)
Admission: AD | Admit: 2023-05-02 | Discharge: 2023-05-09 | DRG: 402 | Disposition: A | Discharge status: Home Health | Payer: Medicare Other | Attending: Orthopedic Surgery | Admitting: Orthopedic Surgery

## 2023-05-02 ENCOUNTER — Other Ambulatory Visit: Payer: Self-pay

## 2023-05-02 ENCOUNTER — Encounter (HOSPITAL_COMMUNITY): Payer: Self-pay | Admitting: Orthopedic Surgery

## 2023-05-02 ENCOUNTER — Ambulatory Visit (HOSPITAL_BASED_OUTPATIENT_CLINIC_OR_DEPARTMENT_OTHER): Payer: Medicare Other | Admitting: Anesthesiology

## 2023-05-02 ENCOUNTER — Ambulatory Visit (HOSPITAL_COMMUNITY): Payer: Medicare Other | Admitting: Vascular Surgery

## 2023-05-02 DIAGNOSIS — I10 Essential (primary) hypertension: Secondary | ICD-10-CM | POA: Diagnosis present

## 2023-05-02 DIAGNOSIS — Z9049 Acquired absence of other specified parts of digestive tract: Secondary | ICD-10-CM

## 2023-05-02 DIAGNOSIS — Z79899 Other long term (current) drug therapy: Secondary | ICD-10-CM

## 2023-05-02 DIAGNOSIS — M47816 Spondylosis without myelopathy or radiculopathy, lumbar region: Secondary | ICD-10-CM | POA: Diagnosis not present

## 2023-05-02 DIAGNOSIS — Z96653 Presence of artificial knee joint, bilateral: Secondary | ICD-10-CM | POA: Diagnosis present

## 2023-05-02 DIAGNOSIS — Z7951 Long term (current) use of inhaled steroids: Secondary | ICD-10-CM | POA: Diagnosis not present

## 2023-05-02 DIAGNOSIS — Z7984 Long term (current) use of oral hypoglycemic drugs: Secondary | ICD-10-CM

## 2023-05-02 DIAGNOSIS — Z981 Arthrodesis status: Secondary | ICD-10-CM | POA: Diagnosis not present

## 2023-05-02 DIAGNOSIS — M5416 Radiculopathy, lumbar region: Secondary | ICD-10-CM | POA: Diagnosis not present

## 2023-05-02 DIAGNOSIS — I358 Other nonrheumatic aortic valve disorders: Secondary | ICD-10-CM | POA: Diagnosis present

## 2023-05-02 DIAGNOSIS — M4316 Spondylolisthesis, lumbar region: Secondary | ICD-10-CM | POA: Diagnosis present

## 2023-05-02 DIAGNOSIS — M48061 Spinal stenosis, lumbar region without neurogenic claudication: Principal | ICD-10-CM | POA: Diagnosis present

## 2023-05-02 DIAGNOSIS — K5900 Constipation, unspecified: Secondary | ICD-10-CM | POA: Diagnosis not present

## 2023-05-02 DIAGNOSIS — G4733 Obstructive sleep apnea (adult) (pediatric): Secondary | ICD-10-CM | POA: Diagnosis present

## 2023-05-02 DIAGNOSIS — Z8249 Family history of ischemic heart disease and other diseases of the circulatory system: Secondary | ICD-10-CM | POA: Diagnosis not present

## 2023-05-02 HISTORY — PX: TRANSFORAMINAL LUMBAR INTERBODY FUSION (TLIF) WITH PEDICLE SCREW FIXATION 1 LEVEL: SHX6141

## 2023-05-02 LAB — GLUCOSE, CAPILLARY
Glucose-Capillary: 127 mg/dL — ABNORMAL HIGH (ref 70–99)
Glucose-Capillary: 127 mg/dL — ABNORMAL HIGH (ref 70–99)
Glucose-Capillary: 58 mg/dL — ABNORMAL LOW (ref 70–99)
Glucose-Capillary: 69 mg/dL — ABNORMAL LOW (ref 70–99)
Glucose-Capillary: 131 mg/dL — ABNORMAL HIGH (ref 70–99)
Glucose-Capillary: 91 mg/dL (ref 70–99)
Glucose-Capillary: 163 mg/dL — ABNORMAL HIGH (ref 70–99)

## 2023-05-02 SURGERY — TRANSFORAMINAL LUMBAR INTERBODY FUSION (TLIF) WITH PEDICLE SCREW FIXATION 1 LEVEL
Anesthesia: General | Site: Spine Lumbar | Laterality: Left

## 2023-05-02 MED ORDER — CEFAZOLIN SODIUM-DEXTROSE 2-4 GM/100ML-% IV SOLN
2.0000 g | Freq: Three times a day (TID) | INTRAVENOUS | Status: AC
  Administered 2023-05-02 – 2023-05-03 (×2): 2 g via INTRAVENOUS
  Filled 2023-05-02 (×2): qty 100

## 2023-05-02 MED ORDER — OXYCODONE-ACETAMINOPHEN 5-325 MG PO TABS
1.0000 | ORAL_TABLET | ORAL | Status: DC | PRN
  Administered 2023-05-03 – 2023-05-04 (×4): 2 via ORAL
  Filled 2023-05-02 (×6): qty 2

## 2023-05-02 MED ORDER — ROSUVASTATIN CALCIUM 20 MG PO TABS
20.0000 mg | ORAL_TABLET | Freq: Every day | ORAL | Status: DC
  Administered 2023-05-02 – 2023-05-08 (×6): 20 mg via ORAL
  Filled 2023-05-02 (×7): qty 1

## 2023-05-02 MED ORDER — DOCUSATE SODIUM 100 MG PO CAPS
100.0000 mg | ORAL_CAPSULE | Freq: Two times a day (BID) | ORAL | Status: DC
  Administered 2023-05-02 – 2023-05-08 (×13): 100 mg via ORAL
  Filled 2023-05-02 (×14): qty 1

## 2023-05-02 MED ORDER — ROCURONIUM BROMIDE 10 MG/ML (PF) SYRINGE
PREFILLED_SYRINGE | INTRAVENOUS | Status: DC | PRN
  Administered 2023-05-02: 50 mg via INTRAVENOUS
  Administered 2023-05-02: 20 mg via INTRAVENOUS

## 2023-05-02 MED ORDER — HYDROCODONE-ACETAMINOPHEN 5-325 MG PO TABS
1.0000 | ORAL_TABLET | ORAL | Status: DC | PRN
  Administered 2023-05-02 – 2023-05-08 (×11): 2 via ORAL
  Administered 2023-05-09: 1 via ORAL
  Filled 2023-05-02 (×6): qty 2
  Filled 2023-05-02: qty 1
  Filled 2023-05-02 (×5): qty 2

## 2023-05-02 MED ORDER — GABAPENTIN 300 MG PO CAPS
300.0000 mg | ORAL_CAPSULE | Freq: Three times a day (TID) | ORAL | Status: DC
  Administered 2023-05-02 – 2023-05-09 (×19): 300 mg via ORAL
  Filled 2023-05-02 (×19): qty 1

## 2023-05-02 MED ORDER — ALBUMIN HUMAN 5 % IV SOLN
INTRAVENOUS | Status: DC | PRN
Start: 2023-05-02 — End: 2023-05-02

## 2023-05-02 MED ORDER — CHLORHEXIDINE GLUCONATE 0.12 % MT SOLN
OROMUCOSAL | Status: AC
  Administered 2023-05-02: 15 mL via OROMUCOSAL
  Filled 2023-05-02: qty 15

## 2023-05-02 MED ORDER — DROPERIDOL 2.5 MG/ML IJ SOLN
0.6250 mg | Freq: Once | INTRAMUSCULAR | Status: AC | PRN
  Administered 2023-05-02: 0.625 mg via INTRAVENOUS

## 2023-05-02 MED ORDER — METHOCARBAMOL 500 MG PO TABS
500.0000 mg | ORAL_TABLET | Freq: Four times a day (QID) | ORAL | Status: DC | PRN
  Administered 2023-05-02: 1000 mg via ORAL
  Administered 2023-05-03 (×2): 500 mg via ORAL
  Administered 2023-05-04 – 2023-05-06 (×5): 1000 mg via ORAL
  Filled 2023-05-02: qty 1
  Filled 2023-05-02 (×5): qty 2
  Filled 2023-05-02: qty 1
  Filled 2023-05-02: qty 2

## 2023-05-02 MED ORDER — AMISULPRIDE (ANTIEMETIC) 5 MG/2ML IV SOLN: 10.0000 mg | Freq: Once | INTRAVENOUS | Status: DC | PRN

## 2023-05-02 MED ORDER — EMPAGLIFLOZIN 25 MG PO TABS
25.0000 mg | ORAL_TABLET | Freq: Every day | ORAL | Status: DC
  Administered 2023-05-03 – 2023-05-09 (×7): 25 mg via ORAL
  Filled 2023-05-02 (×7): qty 1

## 2023-05-02 MED ORDER — FENTANYL CITRATE (PF) 100 MCG/2ML IJ SOLN
INTRAMUSCULAR | Status: AC
  Filled 2023-05-02: qty 2

## 2023-05-02 MED ORDER — POLYVINYL ALCOHOL 1.4 % OP SOLN: 1.0000 [drp] | Freq: Three times a day (TID) | OPHTHALMIC | Status: DC | PRN

## 2023-05-02 MED ORDER — CEFAZOLIN SODIUM-DEXTROSE 2-4 GM/100ML-% IV SOLN
2.0000 g | INTRAVENOUS | Status: AC
  Administered 2023-05-02: 2 g via INTRAVENOUS

## 2023-05-02 MED ORDER — OXYCODONE HCL 5 MG PO TABS
ORAL_TABLET | ORAL | Status: AC
  Filled 2023-05-02: qty 1

## 2023-05-02 MED ORDER — PROPOFOL 10 MG/ML IV BOLUS
INTRAVENOUS | Status: DC | PRN
  Administered 2023-05-02: 150 mg via INTRAVENOUS

## 2023-05-02 MED ORDER — ALUM & MAG HYDROXIDE-SIMETH 200-200-20 MG/5ML PO SUSP
30.0000 mL | Freq: Four times a day (QID) | ORAL | Status: DC | PRN
  Administered 2023-05-07 – 2023-05-08 (×2): 30 mL via ORAL
  Filled 2023-05-02 (×2): qty 30

## 2023-05-02 MED ORDER — THROMBIN 20000 UNITS EX SOLR
CUTANEOUS | Status: DC | PRN
  Administered 2023-05-02: 20 mL via TOPICAL

## 2023-05-02 MED ORDER — SODIUM CHLORIDE 0.9% FLUSH: 3.0000 mL | INTRAVENOUS | Status: DC | PRN

## 2023-05-02 MED ORDER — POVIDONE-IODINE 7.5 % EX SOLN: Freq: Once | CUTANEOUS | Status: DC

## 2023-05-02 MED ORDER — CEFAZOLIN SODIUM-DEXTROSE 2-4 GM/100ML-% IV SOLN
INTRAVENOUS | Status: AC
  Filled 2023-05-02: qty 100

## 2023-05-02 MED ORDER — FENTANYL CITRATE (PF) 100 MCG/2ML IJ SOLN
25.0000 ug | INTRAMUSCULAR | Status: DC | PRN
  Administered 2023-05-02 (×3): 50 ug via INTRAVENOUS

## 2023-05-02 MED ORDER — ACETAMINOPHEN 10 MG/ML IV SOLN: 1000.0000 mg | Freq: Once | INTRAVENOUS | Status: AC

## 2023-05-02 MED ORDER — ONDANSETRON HCL 4 MG/2ML IJ SOLN: 4.0000 mg | Freq: Four times a day (QID) | INTRAMUSCULAR | Status: DC | PRN

## 2023-05-02 MED ORDER — ORAL CARE MOUTH RINSE: 15.0000 mL | Freq: Once | OROMUCOSAL | Status: AC

## 2023-05-02 MED ORDER — MENTHOL 3 MG MT LOZG: 1.0000 | LOZENGE | OROMUCOSAL | Status: DC | PRN

## 2023-05-02 MED ORDER — METHOCARBAMOL 1000 MG/10ML IJ SOLN: 500.0000 mg | Freq: Four times a day (QID) | INTRAVENOUS | Status: DC | PRN

## 2023-05-02 MED ORDER — DEXTROSE 50 % IV SOLN
12.5000 g | INTRAVENOUS | Status: AC
  Filled 2023-05-02: qty 50

## 2023-05-02 MED ORDER — ROCURONIUM BROMIDE 10 MG/ML (PF) SYRINGE
PREFILLED_SYRINGE | INTRAVENOUS | Status: AC
  Filled 2023-05-02: qty 10

## 2023-05-02 MED ORDER — LORATADINE 10 MG PO TABS
10.0000 mg | ORAL_TABLET | Freq: Every day | ORAL | Status: DC
  Administered 2023-05-03 – 2023-05-09 (×7): 10 mg via ORAL
  Filled 2023-05-02 (×7): qty 1

## 2023-05-02 MED ORDER — DEXAMETHASONE SODIUM PHOSPHATE 10 MG/ML IJ SOLN: INTRAMUSCULAR | Status: DC | PRN

## 2023-05-02 MED ORDER — ACETAMINOPHEN 325 MG PO TABS: 650.0000 mg | ORAL_TABLET | ORAL | Status: DC | PRN

## 2023-05-02 MED ORDER — MINERAL OIL LIGHT OIL
TOPICAL_OIL | Status: DC | PRN
Start: 2023-05-02 — End: 2023-05-02
  Administered 2023-05-02: 1 via TOPICAL

## 2023-05-02 MED ORDER — DROPERIDOL 2.5 MG/ML IJ SOLN
INTRAMUSCULAR | Status: AC
  Filled 2023-05-02: qty 2

## 2023-05-02 MED ORDER — 0.9 % SODIUM CHLORIDE (POUR BTL) OPTIME
TOPICAL | Status: DC | PRN
  Administered 2023-05-02 (×2): 1000 mL

## 2023-05-02 MED ORDER — MOMETASONE FURO-FORMOTEROL FUM 100-5 MCG/ACT IN AERO
2.0000 | INHALATION_SPRAY | Freq: Two times a day (BID) | RESPIRATORY_TRACT | Status: DC
  Administered 2023-05-04 – 2023-05-08 (×8): 2 via RESPIRATORY_TRACT
  Filled 2023-05-02 (×2): qty 8.8

## 2023-05-02 MED ORDER — PHENOL 1.4 % MT LIQD: 1.0000 | OROMUCOSAL | Status: DC | PRN

## 2023-05-02 MED ORDER — BUPIVACAINE-EPINEPHRINE 0.25% -1:200000 IJ SOLN
INTRAMUSCULAR | Status: DC | PRN
  Administered 2023-05-02: 20 mL
  Administered 2023-05-02: 10 mL

## 2023-05-02 MED ORDER — FENTANYL CITRATE (PF) 250 MCG/5ML IJ SOLN
INTRAMUSCULAR | Status: DC | PRN
  Administered 2023-05-02: 50 ug via INTRAVENOUS
  Administered 2023-05-02: 100 ug via INTRAVENOUS
  Administered 2023-05-02 (×2): 50 ug via INTRAVENOUS

## 2023-05-02 MED ORDER — FLUTICASONE PROPIONATE 50 MCG/ACT NA SUSP: 1.0000 | Freq: Every day | NASAL | Status: DC | PRN

## 2023-05-02 MED ORDER — LOSARTAN POTASSIUM 50 MG PO TABS
100.0000 mg | ORAL_TABLET | Freq: Every day | ORAL | Status: DC
  Administered 2023-05-03 – 2023-05-08 (×6): 100 mg via ORAL
  Filled 2023-05-02 (×6): qty 2

## 2023-05-02 MED ORDER — BISACODYL 5 MG PO TBEC
5.0000 mg | DELAYED_RELEASE_TABLET | Freq: Every day | ORAL | Status: DC | PRN
  Administered 2023-05-05 – 2023-05-08 (×3): 5 mg via ORAL
  Filled 2023-05-02 (×3): qty 1

## 2023-05-02 MED ORDER — PROPOFOL 10 MG/ML IV BOLUS
INTRAVENOUS | Status: AC
  Filled 2023-05-02: qty 20

## 2023-05-02 MED ORDER — SENNOSIDES-DOCUSATE SODIUM 8.6-50 MG PO TABS
1.0000 | ORAL_TABLET | Freq: Every evening | ORAL | Status: DC | PRN
  Administered 2023-05-02 – 2023-05-07 (×3): 1 via ORAL
  Filled 2023-05-02 (×3): qty 1

## 2023-05-02 MED ORDER — ACETAMINOPHEN 10 MG/ML IV SOLN
INTRAVENOUS | Status: AC
  Administered 2023-05-02: 1000 mg via INTRAVENOUS
  Filled 2023-05-02: qty 100

## 2023-05-02 MED ORDER — ONDANSETRON HCL 4 MG PO TABS
4.0000 mg | ORAL_TABLET | Freq: Four times a day (QID) | ORAL | Status: DC | PRN
  Administered 2023-05-03: 4 mg via ORAL
  Filled 2023-05-02: qty 1

## 2023-05-02 MED ORDER — MORPHINE SULFATE (PF) 2 MG/ML IV SOLN
1.0000 mg | INTRAVENOUS | Status: DC | PRN
  Filled 2023-05-02 (×2): qty 1

## 2023-05-02 MED ORDER — HYDROMORPHONE HCL 1 MG/ML IJ SOLN
0.2500 mg | INTRAMUSCULAR | Status: DC | PRN
  Administered 2023-05-02 (×2): 0.5 mg via INTRAVENOUS

## 2023-05-02 MED ORDER — FENTANYL CITRATE (PF) 250 MCG/5ML IJ SOLN
INTRAMUSCULAR | Status: AC
  Filled 2023-05-02: qty 5

## 2023-05-02 MED ORDER — HYDROMORPHONE HCL 1 MG/ML IJ SOLN
INTRAMUSCULAR | Status: AC
  Filled 2023-05-02: qty 1

## 2023-05-02 MED ORDER — GUAIFENESIN-DM 100-10 MG/5ML PO SYRP: 5.0000 mL | ORAL_SOLUTION | Freq: Four times a day (QID) | ORAL | Status: DC | PRN

## 2023-05-02 MED ORDER — ALBUTEROL SULFATE (2.5 MG/3ML) 0.083% IN NEBU: 3.0000 mL | INHALATION_SOLUTION | RESPIRATORY_TRACT | Status: DC | PRN

## 2023-05-02 MED ORDER — THROMBIN 20000 UNITS EX SOLR
CUTANEOUS | Status: AC
  Filled 2023-05-02: qty 20000

## 2023-05-02 MED ORDER — PHENYLEPHRINE HCL (PRESSORS) 10 MG/ML IV SOLN
INTRAVENOUS | Status: DC | PRN
Start: 2023-05-02 — End: 2023-05-02
  Administered 2023-05-02 (×2): 80 ug via INTRAVENOUS

## 2023-05-02 MED ORDER — OXYCODONE HCL 5 MG/5ML PO SOLN: 5.0000 mg | Freq: Once | ORAL | Status: AC | PRN

## 2023-05-02 MED ORDER — OXYCODONE HCL 5 MG PO TABS
5.0000 mg | ORAL_TABLET | Freq: Once | ORAL | Status: AC | PRN
  Administered 2023-05-02: 5 mg via ORAL

## 2023-05-02 MED ORDER — ONDANSETRON HCL 4 MG/2ML IJ SOLN: 4.0000 mg | Freq: Once | INTRAMUSCULAR | Status: DC | PRN

## 2023-05-02 MED ORDER — FLEET ENEMA RE ENEM
1.0000 | ENEMA | Freq: Once | RECTAL | Status: AC | PRN
  Administered 2023-05-08: 1 via RECTAL
  Filled 2023-05-02: qty 1

## 2023-05-02 MED ORDER — CHLORHEXIDINE GLUCONATE 0.12 % MT SOLN: 15.0000 mL | Freq: Once | OROMUCOSAL | Status: AC

## 2023-05-02 MED ORDER — ONDANSETRON HCL 4 MG/2ML IJ SOLN
INTRAMUSCULAR | Status: DC | PRN
  Administered 2023-05-02: 4 mg via INTRAVENOUS

## 2023-05-02 MED ORDER — ACETAMINOPHEN 500 MG PO TABS
1000.0000 mg | ORAL_TABLET | Freq: Once | ORAL | Status: DC
  Filled 2023-05-02: qty 2

## 2023-05-02 MED ORDER — MINERAL OIL LIGHT OIL
TOPICAL_OIL | Freq: Once | Status: DC
  Filled 2023-05-02: qty 10

## 2023-05-02 MED ORDER — DEXTROSE 50 % IV SOLN
INTRAVENOUS | Status: AC
  Administered 2023-05-02: 12.5 g via INTRAVENOUS
  Filled 2023-05-02: qty 50

## 2023-05-02 MED ORDER — BUPIVACAINE LIPOSOME 1.3 % IJ SUSP
INTRAMUSCULAR | Status: DC | PRN
  Administered 2023-05-02: 20 mL

## 2023-05-02 MED ORDER — ZOLPIDEM TARTRATE 5 MG PO TABS: 5.0000 mg | ORAL_TABLET | Freq: Every evening | ORAL | Status: DC | PRN

## 2023-05-02 MED ORDER — INSULIN ASPART 100 UNIT/ML IJ SOLN: 0.0000 [IU] | INTRAMUSCULAR | Status: DC | PRN

## 2023-05-02 MED ORDER — LABETALOL HCL 5 MG/ML IV SOLN
INTRAVENOUS | Status: DC | PRN
Start: 2023-05-02 — End: 2023-05-02
  Administered 2023-05-02: 10 mg via INTRAVENOUS

## 2023-05-02 MED ORDER — SUGAMMADEX SODIUM 200 MG/2ML IV SOLN
INTRAVENOUS | Status: DC | PRN
  Administered 2023-05-02: 200 mg via INTRAVENOUS

## 2023-05-02 MED ORDER — BUPIVACAINE LIPOSOME 1.3 % IJ SUSP
INTRAMUSCULAR | Status: AC
  Filled 2023-05-02: qty 20

## 2023-05-02 MED ORDER — SODIUM CHLORIDE 0.9% FLUSH: 3.0000 mL | Freq: Two times a day (BID) | INTRAVENOUS | Status: DC

## 2023-05-02 MED ORDER — LATANOPROST 0.005 % OP SOLN
1.0000 [drp] | Freq: Every day | OPHTHALMIC | Status: DC
  Administered 2023-05-03 – 2023-05-08 (×5): 1 [drp] via OPHTHALMIC
  Filled 2023-05-02: qty 2.5

## 2023-05-02 MED ORDER — SODIUM CHLORIDE 0.9 % IV SOLN
INTRAVENOUS | Status: DC | PRN
Start: 2023-05-02 — End: 2023-05-02

## 2023-05-02 MED ORDER — MIDAZOLAM HCL 2 MG/2ML IJ SOLN
INTRAMUSCULAR | Status: AC
  Filled 2023-05-02: qty 2

## 2023-05-02 MED ORDER — SODIUM CHLORIDE 0.9% FLUSH: 10.0000 mL | Freq: Two times a day (BID) | INTRAVENOUS | Status: DC

## 2023-05-02 MED ORDER — EPHEDRINE SULFATE (PRESSORS) 50 MG/ML IJ SOLN
INTRAMUSCULAR | Status: DC | PRN
Start: 2023-05-02 — End: 2023-05-02
  Administered 2023-05-02: 10 mg via INTRAVENOUS
  Administered 2023-05-02: 5 mg via INTRAVENOUS

## 2023-05-02 MED ORDER — LACTATED RINGERS IV SOLN: INTRAVENOUS | Status: DC

## 2023-05-02 MED ORDER — ADULT MULTIVITAMIN W/MINERALS CH
1.0000 | ORAL_TABLET | Freq: Every day | ORAL | Status: DC
  Administered 2023-05-03 – 2023-05-09 (×7): 1 via ORAL
  Filled 2023-05-02 (×7): qty 1

## 2023-05-02 MED ORDER — ACETAMINOPHEN 650 MG RE SUPP: 650.0000 mg | RECTAL | Status: DC | PRN

## 2023-05-02 MED ORDER — LIDOCAINE 2% (20 MG/ML) 5 ML SYRINGE
INTRAMUSCULAR | Status: AC
  Filled 2023-05-02: qty 5

## 2023-05-02 MED ORDER — DEXMEDETOMIDINE HCL IN NACL 80 MCG/20ML IV SOLN
INTRAVENOUS | Status: DC | PRN
Start: 2023-05-02 — End: 2023-05-02
  Administered 2023-05-02 (×4): 4 ug via INTRAVENOUS

## 2023-05-02 MED ORDER — BUPIVACAINE-EPINEPHRINE (PF) 0.25% -1:200000 IJ SOLN
INTRAMUSCULAR | Status: AC
  Filled 2023-05-02: qty 30

## 2023-05-02 SURGICAL SUPPLY — 91 items
AGENT HMST KT MTR STRL THRMB (HEMOSTASIS)
APL SKNCLS STERI-STRIP NONHPOA (GAUZE/BANDAGES/DRESSINGS) ×1
BAG COUNTER SPONGE SURGICOUNT (BAG) ×1 IMPLANT
BAG SPNG CNTER NS LX DISP (BAG) ×1
BENZOIN TINCTURE PRP APPL 2/3 (GAUZE/BANDAGES/DRESSINGS) ×1 IMPLANT
BLADE CLIPPER SURG (BLADE) IMPLANT
BUR PRESCISION 1.7 ELITE (BURR) ×1 IMPLANT
BUR ROUND FLUTED 5 RND (BURR) ×1 IMPLANT
BUR ROUND PRECISION 4.0 (BURR) IMPLANT
BUR SABER RD CUTTING 3.0 (BURR) IMPLANT
CAGE SABLE 10X22 6-12 8D (Cage) IMPLANT
CANNULA GRAFT BNE VG PRE-FILL (Bone Implant) IMPLANT
CLSR STERI-STRIP ANTIMIC 1/2X4 (GAUZE/BANDAGES/DRESSINGS) IMPLANT
CNTNR URN SCR LID CUP LEK RST (MISCELLANEOUS) ×1 IMPLANT
CONT SPEC 4OZ STRL OR WHT (MISCELLANEOUS) ×1
COVER BACK TABLE 60X90IN (DRAPES) ×1 IMPLANT
COVER MAYO STAND STRL (DRAPES) ×2 IMPLANT
COVER SURGICAL LIGHT HANDLE (MISCELLANEOUS) ×1 IMPLANT
DISPENSER GRAFT BNE VG (MISCELLANEOUS) IMPLANT
DISPENSER VIVIGEN BONE GRAFT (MISCELLANEOUS) ×1
DRAIN CHANNEL 15F RND FF W/TCR (WOUND CARE) IMPLANT
DRAPE C-ARM 42X72 X-RAY (DRAPES) ×1 IMPLANT
DRAPE C-ARMOR (DRAPES) IMPLANT
DRAPE POUCH INSTRU U-SHP 10X18 (DRAPES) ×1 IMPLANT
DRAPE SURG 17X23 STRL (DRAPES) ×4 IMPLANT
DURAPREP 26ML APPLICATOR (WOUND CARE) ×1 IMPLANT
ELECT BLADE 4.0 EZ CLEAN MEGAD (MISCELLANEOUS) ×1
ELECT CAUTERY BLADE 6.4 (BLADE) ×1 IMPLANT
ELECT REM PT RETURN 9FT ADLT (ELECTROSURGICAL) ×1
ELECTRODE BLDE 4.0 EZ CLN MEGD (MISCELLANEOUS) ×1 IMPLANT
ELECTRODE REM PT RTRN 9FT ADLT (ELECTROSURGICAL) ×1 IMPLANT
EVACUATOR SILICONE 100CC (DRAIN) IMPLANT
FILTER STRAW FLUID ASPIR (MISCELLANEOUS) ×1 IMPLANT
GAUZE 4X4 16PLY ~~LOC~~+RFID DBL (SPONGE) ×1 IMPLANT
GAUZE SPONGE 4X4 12PLY STRL (GAUZE/BANDAGES/DRESSINGS) ×1 IMPLANT
GLOVE BIO SURGEON STRL SZ 6.5 (GLOVE) ×1 IMPLANT
GLOVE BIO SURGEON STRL SZ8 (GLOVE) ×1 IMPLANT
GLOVE BIOGEL PI IND STRL 7.0 (GLOVE) ×1 IMPLANT
GLOVE BIOGEL PI IND STRL 8 (GLOVE) ×1 IMPLANT
GLOVE SURG ENC MOIS LTX SZ6.5 (GLOVE) ×1 IMPLANT
GOWN STRL REUS W/ TWL LRG LVL3 (GOWN DISPOSABLE) ×2 IMPLANT
GOWN STRL REUS W/ TWL XL LVL3 (GOWN DISPOSABLE) ×1 IMPLANT
GOWN STRL REUS W/TWL LRG LVL3 (GOWN DISPOSABLE) ×2
GOWN STRL REUS W/TWL XL LVL3 (GOWN DISPOSABLE) ×1
GRAFT BONE CANNULA VIVIGEN 3 (Bone Implant) ×2 IMPLANT
IV CATH 14GX2 1/4 (CATHETERS) ×1 IMPLANT
KIT BASIN OR (CUSTOM PROCEDURE TRAY) ×1 IMPLANT
KIT POSITION SURG JACKSON T1 (MISCELLANEOUS) ×1 IMPLANT
KIT TURNOVER KIT B (KITS) ×1 IMPLANT
MARKER SKIN DUAL TIP RULER LAB (MISCELLANEOUS) ×2 IMPLANT
NDL 18GX1X1/2 (RX/OR ONLY) (NEEDLE) ×1 IMPLANT
NDL 22X1.5 STRL (OR ONLY) (MISCELLANEOUS) ×2 IMPLANT
NDL HYPO 25GX1X1/2 BEV (NEEDLE) ×1 IMPLANT
NDL SPNL 18GX3.5 QUINCKE PK (NEEDLE) ×2 IMPLANT
NEEDLE 18GX1X1/2 (RX/OR ONLY) (NEEDLE) ×1
NEEDLE 22X1.5 STRL (OR ONLY) (MISCELLANEOUS) ×2
NEEDLE HYPO 25GX1X1/2 BEV (NEEDLE) ×1
NEEDLE SPNL 18GX3.5 QUINCKE PK (NEEDLE) ×2
NS IRRIG 1000ML POUR BTL (IV SOLUTION) ×1 IMPLANT
PACK LAMINECTOMY ORTHO (CUSTOM PROCEDURE TRAY) ×1 IMPLANT
PACK UNIVERSAL I (CUSTOM PROCEDURE TRAY) ×1 IMPLANT
PAD ARMBOARD 7.5X6 YLW CONV (MISCELLANEOUS) ×2 IMPLANT
PATTIES SURGICAL .5 X1 (DISPOSABLE) ×1 IMPLANT
PATTIES SURGICAL .5X1.5 (GAUZE/BANDAGES/DRESSINGS) ×1 IMPLANT
PUTTY DBX 2.5CC (Putty) ×1 IMPLANT
PUTTY DBX 2.5CC DEPUY (Putty) IMPLANT
ROD PRE BENT EXP 40MM (Rod) IMPLANT
SCREW SET SINGLE INNER (Screw) IMPLANT
SCREW VIPER CORT FIX 6.00X30 (Screw) IMPLANT
SPONGE INTESTINAL PEANUT (DISPOSABLE) ×1 IMPLANT
SPONGE SURGIFOAM ABS GEL 100 (HEMOSTASIS) ×1 IMPLANT
STRIP CLOSURE SKIN 1/2X4 (GAUZE/BANDAGES/DRESSINGS) ×2 IMPLANT
SURGIFLO W/THROMBIN 8M KIT (HEMOSTASIS) IMPLANT
SUT MNCRL AB 4-0 PS2 18 (SUTURE) ×1 IMPLANT
SUT VIC AB 0 CT1 18XCR BRD 8 (SUTURE) ×1 IMPLANT
SUT VIC AB 0 CT1 8-18 (SUTURE) ×1
SUT VIC AB 1 CT1 18XCR BRD 8 (SUTURE) ×1 IMPLANT
SUT VIC AB 1 CT1 8-18 (SUTURE) ×1
SUT VIC AB 2-0 CT2 18 VCP726D (SUTURE) ×1 IMPLANT
SYR 20ML LL LF (SYRINGE) ×2 IMPLANT
SYR BULB IRRIG 60ML STRL (SYRINGE) ×1 IMPLANT
SYR CONTROL 10ML LL (SYRINGE) ×2 IMPLANT
SYR TB 1ML LUER SLIP (SYRINGE) ×1 IMPLANT
TAP EXPEDIUM DL 4.35 (INSTRUMENTS) IMPLANT
TAP EXPEDIUM DL 5.0 (INSTRUMENTS) IMPLANT
TAP EXPEDIUM DL 6.0 (INSTRUMENTS) IMPLANT
TAPE CLOTH SURG 4X10 WHT LF (GAUZE/BANDAGES/DRESSINGS) IMPLANT
TRAY FOLEY MTR SLVR 16FR STAT (SET/KITS/TRAYS/PACK) ×1 IMPLANT
TUBE FUNNEL GL DISP (ORTHOPEDIC DISPOSABLE SUPPLIES) IMPLANT
WATER STERILE IRR 1000ML POUR (IV SOLUTION) ×1 IMPLANT
YANKAUER SUCT BULB TIP NO VENT (SUCTIONS) ×1 IMPLANT

## 2023-05-02 NOTE — Op Note (Signed)
PATIENT NAME: Rachel Crane   MEDICAL RECORD NO.:   [id number]   DATE OF BIRTH: 1945-11-29   DATE OF PROCEDURE: 05/02/2023                               OPERATIVE REPORT     PREOPERATIVE DIAGNOSES: 1. Bilateral lumbar radiculopathy 2. L3-4 spinal stenosis 3. L3-4 spondylolisthesis   POSTOPERATIVE DIAGNOSES: 1. Bilateral lumbar radiculopathy 2. L3-4 spinal stenosis 3. L3-4 spondylolisthesis   PROCEDURES: 1. L3/4 decompression 2. Left-sided L3-4 transforaminal lumbar interbody fusion. 3. Right-sided L3-4 posterolateral fusion. 4. Insertion of interbody device x1 (Globus expandable intervertebral spacer). 5. Placement of segmental posterior instrumentation L4, L5 bilaterally  6. Use of local autograft. 7. Use of morselized allograft - Vivigen 8. Intraoperative use of fluoroscopy.   SURGEON:  Estill Bamberg, MD.   ASSISTANTJason Coop, PA-C.   ANESTHESIA:  General endotracheal anesthesia.   COMPLICATIONS:  None.   DISPOSITION:  Stable.   ESTIMATED BLOOD LOSS:  100cc   INDICATIONS FOR SURGERY:  Briefly,  Rachel Crane is a pleasant 77 year old female who did present to me with severe and ongoing pain in the right and left legs. I did feel that the symptoms were secondary to the findings noted above. The patient failed conservative care and did wish to proceed with the procedure  noted above.   OPERATIVE DETAILS:  On 05/02/2023, the patient was brought to surgery and general endotracheal anesthesia was administered.  The patient was placed prone on a well-padded flat Jackson bed with a spinal frame.  Antibiotics were given and a time-out procedure was performed. The back was prepped and draped in the usual fashion.  A midline incision was made overlying the L3-4 intervertebral spaces.  The fascia was incised at the midline.  The paraspinal musculature was bluntly swept laterally.  Anatomic landmarks for the pedicles were exposed. Using fluoroscopy, I did cannulate the L3  and L4 pedicles bilaterally, using a medial to lateral cortical trajectory technique.  At this point, 6 x 30 mm screws were placed into the right pedicles, and a 40 mm rod was placed into the tulip heads of the screw, and caps were also placed.  Distraction was then applied across the L3-4 intervertebral space, and the caps were then provisionally tightened.  On the left side, bone wax was placed into the cannulated pedicle holes.  I then proceeded with the decompressive aspect of the procedure at the L3-4 level.  A partial facetectomy was performed bilaterally at L3-4, decompressing the L3-4 intervertebral space.  I was very pleased with the decompression. With an assistant holding medial retraction of the traversing left L4 nerve, I did perform an annulotomy at the posterolateral aspect of the L3-4 intervertebral space.  I then used a series of curettes and pituitary rongeurs to perform a thorough and complete intervertebral diskectomy.  The intervertebral space was then liberally packed with autograft as well as allograft in the form of Vivigen, as was the appropriate-sized intervertebral spacer.  The spacer was then tamped into position in the usual fashion, and expanded to 10.2 mm in height. I was very pleased with the press-fit of the spacer.  I then placed 6 mm screws on the left at L3 and L4. A 40-mm rod was then placed and caps were placed. The distraction was then released on the contralateral side.  All caps were then locked.  The wound was copiously irrigated with a  total of approximately 3 L prior to placing the bone graft.  Additional autograft and allograft was then packed into the posterolateral gutter on the right side to help aid in the success of the fusion.  The wound was  explored for any undue bleeding and there was no substantial bleeding encountered.  Gel-Foam was placed over the laminectomy site.  The wound was then closed in layers using #1 Vicryl followed by 2-0 Vicryl,  followed by 4-0 Monocryl.  Benzoin and Steri-Strips were applied followed by sterile dressing.     Of note, Jason Coop was my assistant throughout surgery, and did aid in retraction, suctioning, the decompression, placement of the hardware, and closure.     Estill Bamberg, MD

## 2023-05-02 NOTE — Anesthesia Postprocedure Evaluation (Signed)
Anesthesia Post Note  Patient: Rachel Crane  Procedure(s) Performed: LEFT-SIDED LUMBAR THREE- LUMBAR FOUR TRANSFORAMINAL LUMBAR INTERBODY FUSION AND DECOMPRESSION WITH INSTRUMENTATION AND ALLOGRAFT (Left: Spine Lumbar)     Patient location during evaluation: PACU Anesthesia Type: General Level of consciousness: awake and alert Pain management: pain level controlled Vital Signs Assessment: post-procedure vital signs reviewed and stable Respiratory status: spontaneous breathing, nonlabored ventilation, respiratory function stable and patient connected to nasal cannula oxygen Cardiovascular status: blood pressure returned to baseline and stable Postop Assessment: no apparent nausea or vomiting Anesthetic complications: no  No notable events documented.  Last Vitals:  Vitals:   05/02/23 2100 05/02/23 2115  BP: (!) 143/71 126/73  Pulse: 63 62  Resp: 19 14  Temp:  36.6 C  SpO2: 97% 95%    Last Pain:  Vitals:   05/02/23 1126  PainSc: 3                  Shelton Silvas

## 2023-05-02 NOTE — H&P (Signed)
PREOPERATIVE H&P  Chief Complaint: Bilateral leg pain  HPI: Rachel Crane is a 77 y.o. female who presents with ongoing pain in the bilateral legs  MRI reveals severe stenosis and instability at L3/4   Patient has failed multiple forms of conservative care and continues to have pain (see office notes for additional details regarding the patient's full course of treatment)  Past Medical History:  Diagnosis Date   Abnormal stress ECG 10/06/2018   Aortic valve sclerosis 10/06/2018   Arthritis    Asthma    Bilateral carotid bruits 10/06/2018   Headache    H/O bad HA, since using CPAP- she has had improvement with that issue    Hypertension    Laboratory examination 10/06/2018   OSA on CPAP    settting - 8, uses CPAP q night    Pre-diabetes    Shingles    Past Surgical History:  Procedure Laterality Date   CHOLECYSTECTOMY  1998   TOTAL KNEE ARTHROPLASTY Right 09/19/2017   Procedure: RIGHT TOTAL KNEE ARTHROPLASTY;  Surgeon: Tarry Kos, MD;  Location: [facility] OR;  Service: Orthopedics;  Laterality: Right;   TOTAL KNEE ARTHROPLASTY Left 06/06/2020   Procedure: LEFT TOTAL KNEE ARTHROPLASTY;  Surgeon: Tarry Kos, MD;  Location: [facility] OR;  Service: Orthopedics;  Laterality: Left;   TUBAL LIGATION     Social History   Socioeconomic History   Marital status: Widowed    Spouse name: Not on file   Number of children: 3   Years of education: Not on file   Highest education level: Not on file  Occupational History   Not on file  Tobacco Use   Smoking status: Never   Smokeless tobacco: Never  Vaping Use   Vaping status: Never Used  Substance and Sexual Activity   Alcohol use: Not Currently   Drug use: No   Sexual activity: Not Currently  Other Topics Concern   Not on file  Social History Narrative   Not on file   Social Determinants of Health   Financial Resource Strain: Not on file  Food Insecurity: Not on file  Transportation Needs: Not on file  Physical Activity: Not  on file  Stress: Not on file  Social Connections: Not on file   Family History  Problem Relation Age of Onset   Hypertension Mother    Arthritis Mother    Breast cancer Sister    Sleep apnea Neg Hx    No Active Allergies Prior to Admission medications   Medication Sig Start Date End Date Taking? Authorizing Provider  albuterol (VENTOLIN HFA) 108 (90 Base) MCG/ACT inhaler Inhale 2 puffs into the lungs every 4 (four) hours as needed for wheezing or shortness of breath (coughing fits). 02/08/23  Yes Ellamae Sia, DO  Ascorbic Acid (VITAMIN C) 100 MG tablet Take 100 mg by mouth daily.   Yes [provider]  budesonide-formoterol (SYMBICORT) 80-4.5 MCG/ACT inhaler Inhale 2 puffs into the lungs in the morning and at bedtime. Patient taking differently: Inhale 2 puffs into the lungs 2 (two) times daily as needed (shortness of breath). 01/18/23  Yes Zenia Resides, MD  carboxymethylcellulose (REFRESH PLUS) 0.5 % SOLN Place 1 drop into both eyes 3 (three) times daily as needed (dry eyes).   Yes [provider]  empagliflozin (JARDIANCE) 25 MG TABS tablet Take 25 mg by mouth daily. 06/28/21  Yes [provider]  fexofenadine (ALLEGRA ALLERGY) 180 MG tablet Take 180 mg by mouth  daily as needed for allergies.   Yes [provider]  fluticasone (FLONASE) 50 MCG/ACT nasal spray Place 1 spray into both nostrils daily as needed (nasal congestion). 11/29/22  Yes Crain, Whitney L, PA  gabapentin (NEURONTIN) 300 MG capsule Take 2 capsules (600 mg total) by mouth 3 (three) times daily. Patient taking differently: Take 300 mg by mouth 3 (three) times daily. 11/05/22 05/02/23 Yes London Sheer, MD  latanoprost (XALATAN) 0.005 % ophthalmic solution Place 1 drop into both eyes at bedtime. 12/31/22  Yes [provider]  loratadine (CLARITIN) 10 MG tablet Take 1 tablet (10 mg total) by mouth daily. Patient taking differently: Take 10 mg by mouth daily as needed for  allergies. 09/04/22  Yes Ellamae Sia, DO  losartan (COZAAR) 100 MG tablet Take 1 tablet (100 mg total) by mouth daily at 10 pm. 09/18/22  Yes Tolia, Sunit, DO  methocarbamol (ROBAXIN) 500 MG tablet Take 500 mg by mouth every 6 (six) hours as needed for muscle spasms.   Yes [provider]  Multiple Vitamin (MULTIVITAMIN) tablet Take 1 tablet by mouth daily.   Yes [provider]  rosuvastatin (CRESTOR) 20 MG tablet Take 1 tablet (20 mg total) by mouth at bedtime. 04/16/23  Yes Tolia, Sunit, DO  traMADol (ULTRAM) 50 MG tablet Take 50 mg by mouth every 6 (six) hours as needed for moderate pain.   Yes [provider]  triamcinolone ointment (KENALOG) 0.1 % Apply 1 Application topically 2 (two) times daily as needed. 02/08/23  Yes Ellamae Sia, DO  acetic acid-hydrocortisone (VOSOL-HC) OTIC solution Place 3 drops into the left ear 2 (two) times daily. Patient not taking: Reported on 04/17/2023 11/29/22   Maretta Bees, PA  Dermatological Products, Misc. [facility]) lotion Apply twice a day to the body as a moisturizer 05/16/22   Ellamae Sia, DO  Potassium Chloride ER 20 MEQ TBCR Take 1 tablet by mouth daily. Patient not taking: Reported on 04/17/2023 08/09/20   [provider]  predniSONE (DELTASONE) 20 MG tablet 3 tabs daily x3 days, then 2 tabs daily x3 days, then 1 tab daily x3 days, then one half tab daily x3 days, then stop Patient not taking: Reported on 04/17/2023 01/18/23   Zenia Resides, MD  promethazine-dextromethorphan (PROMETHAZINE-DM) 6.25-15 MG/5ML syrup Take 5 mLs by mouth 4 (four) times daily as needed for cough. 01/18/23   Zenia Resides, MD     All other systems have been reviewed and were otherwise negative with the exception of those mentioned in the HPI and as above.  Physical Exam: Vitals:   05/02/23 1109  BP: (!) 152/81  Pulse: 72  Resp: 17  Temp: 98.2 F (36.8 C)  SpO2: 96%    Body mass index is 29.7 kg/m.  General: Alert, no acute  distress Cardiovascular: No pedal edema Respiratory: No cyanosis, no use of accessory musculature Skin: No lesions in the area of chief complaint Neurologic: Sensation intact distally Psychiatric: Patient is competent for consent with normal mood and affect Lymphatic: No axillary or cervical lymphadenopathy   Assessment/Plan: Spinal stenosis and spondylolisthesis, L3/4 Plan for Procedure(s): LEFT-SIDED LUMBAR 3- LUMBAR 4 TRANSFORAMINAL LUMBAR INTERBODY FUSION AND DECOMPRESSION WITH INSTRUMENTATION AND ALLOGRAFT   Jackelyn Hoehn, MD 05/02/2023 3:33 PM

## 2023-05-02 NOTE — Anesthesia Procedure Notes (Signed)
Procedure Name: Intubation Date/Time: 05/02/2023 5:40 PM  Performed by: Sandie Ano, CRNAPre-anesthesia Checklist: Patient identified, Emergency Drugs available, Suction available and Patient being monitored Patient Re-evaluated:Patient Re-evaluated prior to induction Oxygen Delivery Method: Circle System Utilized Preoxygenation: Pre-oxygenation with 100% oxygen Induction Type: IV induction Ventilation: Mask ventilation without difficulty Laryngoscope Size: Mac and 3 Grade View: Grade II Tube type: Oral Tube size: 7.0 mm Number of attempts: 1 Airway Equipment and Method: Stylet and Oral airway Placement Confirmation: ETT inserted through vocal cords under direct vision, positive ETCO2 and breath sounds checked- equal and bilateral Secured at: 22 cm Tube secured with: Tape Dental Injury: Teeth and Oropharynx as per pre-operative assessment

## 2023-05-02 NOTE — Transfer of Care (Signed)
Immediate Anesthesia Transfer of Care Note  Patient: Rachel Crane  Procedure(s) Performed: LEFT-SIDED LUMBAR THREE- LUMBAR FOUR TRANSFORAMINAL LUMBAR INTERBODY FUSION AND DECOMPRESSION WITH INSTRUMENTATION AND ALLOGRAFT (Left: Spine Lumbar)  Patient Location: PACU  Anesthesia Type:General  Level of Consciousness: awake and alert   Airway & Oxygen Therapy: Patient Spontanous Breathing  Post-op Assessment: Report given to RN and Post -op Vital signs reviewed and stable  Post vital signs: Reviewed and stable  Last Vitals:  Vitals Value Taken Time  BP 171/71 05/02/23 2025  Temp    Pulse 71 05/02/23 2028  Resp 17 05/02/23 2028  SpO2 94 % 05/02/23 2028  Vitals shown include unfiled device data.  Last Pain:  Vitals:   05/02/23 1126  PainSc: 3          Complications: No notable events documented.

## 2023-05-03 ENCOUNTER — Encounter (HOSPITAL_COMMUNITY): Payer: Self-pay | Admitting: Orthopedic Surgery

## 2023-05-03 DIAGNOSIS — Z96653 Presence of artificial knee joint, bilateral: Secondary | ICD-10-CM | POA: Diagnosis present

## 2023-05-03 DIAGNOSIS — G4733 Obstructive sleep apnea (adult) (pediatric): Secondary | ICD-10-CM | POA: Diagnosis present

## 2023-05-03 DIAGNOSIS — Z7984 Long term (current) use of oral hypoglycemic drugs: Secondary | ICD-10-CM | POA: Diagnosis not present

## 2023-05-03 DIAGNOSIS — M4316 Spondylolisthesis, lumbar region: Secondary | ICD-10-CM | POA: Diagnosis present

## 2023-05-03 DIAGNOSIS — K5900 Constipation, unspecified: Secondary | ICD-10-CM | POA: Diagnosis present

## 2023-05-03 DIAGNOSIS — Z79899 Other long term (current) drug therapy: Secondary | ICD-10-CM | POA: Diagnosis not present

## 2023-05-03 DIAGNOSIS — I10 Essential (primary) hypertension: Secondary | ICD-10-CM | POA: Diagnosis present

## 2023-05-03 DIAGNOSIS — Z9049 Acquired absence of other specified parts of digestive tract: Secondary | ICD-10-CM | POA: Diagnosis not present

## 2023-05-03 DIAGNOSIS — M48061 Spinal stenosis, lumbar region without neurogenic claudication: Secondary | ICD-10-CM | POA: Diagnosis present

## 2023-05-03 DIAGNOSIS — M5416 Radiculopathy, lumbar region: Secondary | ICD-10-CM | POA: Diagnosis present

## 2023-05-03 DIAGNOSIS — Z7951 Long term (current) use of inhaled steroids: Secondary | ICD-10-CM | POA: Diagnosis not present

## 2023-05-03 DIAGNOSIS — I358 Other nonrheumatic aortic valve disorders: Secondary | ICD-10-CM | POA: Diagnosis present

## 2023-05-03 DIAGNOSIS — Z8249 Family history of ischemic heart disease and other diseases of the circulatory system: Secondary | ICD-10-CM | POA: Diagnosis not present

## 2023-05-03 LAB — GLUCOSE, CAPILLARY
Glucose-Capillary: 207 mg/dL — ABNORMAL HIGH (ref 70–99)
Glucose-Capillary: 133 mg/dL — ABNORMAL HIGH (ref 70–99)
Glucose-Capillary: 133 mg/dL — ABNORMAL HIGH (ref 70–99)
Glucose-Capillary: 149 mg/dL — ABNORMAL HIGH (ref 70–99)

## 2023-05-03 MED ORDER — HYDROXYZINE HCL 50 MG/ML IM SOLN
50.0000 mg | Freq: Four times a day (QID) | INTRAMUSCULAR | Status: DC | PRN
  Administered 2023-05-03: 50 mg via INTRAMUSCULAR
  Filled 2023-05-03: qty 1

## 2023-05-03 MED ORDER — TRAMADOL HCL 50 MG PO TABS: 50.0000 mg | ORAL_TABLET | Freq: Four times a day (QID) | ORAL | 1 refills | Status: AC | PRN

## 2023-05-03 MED ORDER — OXYCODONE-ACETAMINOPHEN 5-325 MG PO TABS: 1.0000 | ORAL_TABLET | ORAL | 0 refills | Status: DC | PRN

## 2023-05-03 MED ORDER — METHOCARBAMOL 500 MG PO TABS: 500.0000 mg | ORAL_TABLET | Freq: Four times a day (QID) | ORAL | 2 refills | Status: DC | PRN

## 2023-05-03 NOTE — TOC Initial Note (Signed)
Transition of Care [facility]) - Initial/Assessment Note    Patient Details  Name: Rachel Crane MRN: [id number] Date of Birth: 09-Oct-1945  Transition of Care [facility]) CM/SW Contact:    Rachel Bridgeman, RN Phone Number: 05/03/2023, 4:01 PM  Clinical Narrative:                 CM met with the patient at the bedside to discuss TOC needs.  The patient was provided with Medicare Observation notice - S/P L3-L4 TLIF and currently has JP drain.  I spoke with the patient and patient is aware that she was evaluated post-operatively and does not qualify for SNF placement.  The patient lives alone and states that her friend, Rachel Crane can transport her home by car.  Patient mobilized well with PT - but considering patient lives alone was agreeable to PT/OT home eval and treat for a few visits and will follow up with Dr. Marshell Levan office.  I called Dr. Yevette Edwards on the phone and made him aware that patient does not qualify for SNF placement and patient is fine discharging home in the am.  Medicare choice was offered to the patient and she did not have a preference.  I called [facility] and the patient was set up with PT/OT for home eval and treat - to start over the weekend.  HH orders placed to be co-signed by the MD.  Dr. Yevette Edwards will call the patient's prescriptions into the [facility] at [facility].  Patient and bedside nursing are aware and bedside nursing will shred the current hard prescriptions in the chart.  Patient will discharge home with friend in the AM.  No DME needed per therapy notes.  Expected Discharge Plan: Home w Home Health Services Barriers to Discharge: Continued Medical Work up   Patient Goals and CMS Choice Patient states their goals for this hospitalization and ongoing recovery are:: To get better CMS Medicare.gov Compare Post Acute Care list provided to:: Patient Choice offered to / list presented to : Patient [place] ownership interest in [facility].provided to:: Patient    Expected Discharge Plan and Services   Discharge Planning Services: CM Consult Post Acute Care Choice: Home Health Living arrangements for the past 2 months: Single Family Home                           HH Arranged: PT, OT HH Agency: [facility] Date [facility] Agency Contacted: 05/03/23 Time HH Agency Contacted: 1600 Representative spoke with at [facility] Agency: [facility] accepted for PT/OT  Prior Living Arrangements/Services Living arrangements for the past 2 months: Single Family Home Lives with:: Self Patient language and need for interpreter reviewed:: Yes Do you feel safe going back to the place where you live?: Yes      Need for Family Participation in Patient Care: Yes (Comment) Care giver support system in place?: Yes (comment) Current home services: Home OT, Home PT Criminal Activity/Legal Involvement Pertinent to Current Situation/Hospitalization: No - Comment as needed  Activities of Daily Living   ADL Screening (condition at time of admission) Independently performs ADLs?: Yes (appropriate for developmental age) Is the patient deaf or have difficulty hearing?: No Does the patient have difficulty seeing, even when wearing glasses/contacts?: No Does the patient have difficulty concentrating, remembering, or making decisions?: No  Permission Sought/Granted Permission sought to share information with : Case Manager Permission granted to share information with : Yes, Verbal Permission Granted  Permission granted to share info w AGENCY: Home health for PT/OT        Emotional Assessment Appearance:: Appears stated age Attitude/Demeanor/Rapport: Gracious Affect (typically observed): Accepting Orientation: : Oriented to Self, Oriented to Place, Oriented to  Time, Oriented to Situation Alcohol / Substance Use: Not Applicable Psych Involvement: No (comment)  Admission diagnosis:  Radiculopathy, lumbar region  [M54.16] Patient Active Problem List   Diagnosis Date Noted   Radiculopathy, lumbar region 05/02/2023   Status post total right knee replacement 02/15/2022   Chronic low back pain 02/15/2022   Moderate persistent asthma without complication 11/03/2021   Gastroesophageal reflux disease with esophagitis 11/03/2021   Status post total left knee replacement 06/06/2020   Positive ANA (antinuclear antibody) 04/25/2020   Primary osteoarthritis of left knee 04/14/2020   Lumbar radiculopathy 06/26/2019   Left-sided low back pain with left-sided sciatica 06/26/2019   Thoracic spinal stenosis 04/18/2019   Seasonal allergies 12/04/2018   Seasonal and perennial allergic rhinoconjunctivitis 10/10/2018   Moderate persistent asthma with acute exacerbation 10/10/2018   Pruritus 10/10/2018   Aortic valve sclerosis 10/06/2018   Bilateral carotid bruits 10/06/2018   Abnormal stress ECG 10/06/2018   Laboratory examination 10/06/2018   Palpitations 10/06/2018   Total knee replacement status 09/19/2017   Sprain of anterior talofibular ligament of right ankle 12/13/2016   PCP:  Lorenda Ishihara, MD Pharmacy:   [facility] [phone] [place], [place] - [address] AT [address] [place] [address] Phone: [phone] Fax: [phone]  [facility] [place] - [address] [facility] [address] [facility] [address] [place] [address] Phone: [phone] Fax: [phone]  [id number] - [place], [place] - [address] AT [facility] OF [address] [place] [address] Phone: [phone] Fax: [phone]     Social Determinants of Health (SDOH) Social History: SDOH Screenings   Food Insecurity: No Food Insecurity (05/03/2023)  Housing: Low Risk  (05/03/2023)  Transportation Needs: No Transportation Needs (05/03/2023)  Utilities: Not At Risk (05/03/2023)  Depression (PHQ2-9): Medium Risk (01/22/2019)   Tobacco Use: Low Risk  (05/03/2023)   SDOH Interventions:     Readmission Risk Interventions     No data to display

## 2023-05-03 NOTE — Progress Notes (Signed)
Patient to transfer to 2W02. Report given to 2W RN. Patient in no signs of distress at this time. Will follow up

## 2023-05-03 NOTE — Care Management Obs Status (Cosign Needed)
MEDICARE OBSERVATION STATUS NOTIFICATION   Patient Details  Name: Rachel Crane MRN: [id number] Date of Birth: 1946-04-13   Medicare Observation Status Notification Given:  Yes    Janae Bridgeman, RN 05/03/2023, 3:14 PM

## 2023-05-03 NOTE — Plan of Care (Signed)
[facility] Tonsillectomy/Adenoidectomy, Postoperative PEDS care plan entered in error.

## 2023-05-03 NOTE — Evaluation (Signed)
Physical Therapy Evaluation  Patient Details Name: Rachel Crane MRN: [id number] DOB: 1945/12/07 Today's Date: 05/03/2023  History of Present Illness  Rachel Crane is a 77 yo female who underwent L3-4 TLIF 10/17. PMHx: Lt TKA 06/06/20, Rt TKA 09/19/17, HTN, Aortic valve sclerosis, asthma, bilateral carotid bruits, HA, CPAP, shingles, pre-diabetes   Clinical Impression  Pt admitted with above diagnosis. At the time of PT eval, pt was able to demonstrate transfers and ambulation with gross modified independence to supervision for safety. Pt requesting SNF placement at d/c as she lives alone, however pt with no immediate PT needs at d/c. However, MD can order outpatient PT for pt when appropriate per post-op protocol if pt desires. Pt was educated on precautions, brace application/wearing schedule, appropriate activity progression, and car transfer. Pt currently with functional limitations due to the deficits listed below (see PT Problem List). Pt will benefit from skilled PT to increase their independence and safety with mobility to allow discharge to the venue listed below.          If plan is discharge home, recommend the following: A little help with walking and/or transfers;Assistance with cooking/housework;Assist for transportation   Can travel by private vehicle        Equipment Recommendations None recommended by PT  Recommendations for Other Services       Functional Status Assessment Patient has had a recent decline in their functional status and demonstrates the ability to make significant improvements in function in a reasonable and predictable amount of time.     Precautions / Restrictions Precautions Precautions: Fall;Back Precaution Booklet Issued: Yes (comment) Precaution Comments: Reviewed handout and pt was cued for precautions during functional mobility. Required Braces or Orthoses: Spinal Brace Spinal Brace: Thoracolumbosacral orthotic;Applied in sitting  position Restrictions Weight Bearing Restrictions: No      Mobility  Bed Mobility Overal bed mobility: Modified Independent Bed Mobility: Rolling, Sidelying to Sit           General bed mobility comments: VC's for optimal log roll technique. No assist required.    Transfers Overall transfer level: Modified independent Equipment used: None Transfers: Sit to/from Stand             General transfer comment: Increased time but no assist required for power up to full stand.    Ambulation/Gait Ambulation/Gait assistance: Supervision Gait Distance (Feet): 400 Feet Assistive device: None Gait Pattern/deviations: Step-through pattern, Decreased stride length, Trunk flexed Gait velocity: Decreased Gait velocity interpretation: 1.31 - 2.62 ft/sec, indicative of limited community ambulator   General Gait Details: VC's for improved posture and forward gaze. No assist required and no overt LOB noted.  Stairs            Wheelchair Mobility     Tilt Bed    Modified Rankin (Stroke Patients Only)       Balance Overall balance assessment: No apparent balance deficits (not formally assessed)                                           Pertinent Vitals/Pain Pain Assessment Pain Assessment: Faces Faces Pain Scale: Hurts little more Pain Location: back Pain Descriptors / Indicators: Discomfort Pain Intervention(s): Limited activity within patient's tolerance, Monitored during session, Repositioned    Home Living Family/patient expects to be discharged to:: Private residence Living Arrangements: Alone Available Help at Discharge: Family;Available PRN/intermittently Type of Home:  House Home Access: Stairs to enter   [employer] of Steps: 1   Home Layout: One level Home Equipment: Agricultural consultant (2 wheels);BSC/3in1      Prior Function Prior Level of Function : Independent/Modified Independent;Driving;Working/employed              Mobility Comments: no AD; pt works 4 hours/day 2-3 days/week at [employer] ADLs Comments: indep ADLs/IADLs     Extremity/Trunk Assessment   Upper Extremity Assessment Upper Extremity Assessment: Overall WFL for tasks assessed    Lower Extremity Assessment Lower Extremity Assessment: Generalized weakness (Mild; consistent with pre-op diagnosis)    Cervical / Trunk Assessment Cervical / Trunk Assessment: Back Surgery  Communication   Communication Communication: No apparent difficulties  Cognition Arousal: Alert Behavior During Therapy: WFL for tasks assessed/performed Overall Cognitive Status: Within Functional Limits for tasks assessed                                          General Comments      Exercises     Assessment/Plan    PT Assessment Patient needs continued PT services  PT Problem List Decreased strength;Decreased activity tolerance;Decreased balance;Decreased mobility;Decreased knowledge of use of DME;Decreased safety awareness;Decreased knowledge of precautions;Pain       PT Treatment Interventions DME instruction;Gait training;Functional mobility training;Therapeutic activities;Therapeutic exercise;Balance training;Patient/family education    PT Goals (Current goals can be found in the Care Plan section)  Acute Rehab PT Goals Patient Stated Goal: Go to SNF PT Goal Formulation: With patient Time For Goal Achievement: 05/10/23 Potential to Achieve Goals: Good    Frequency Min 5X/week     Co-evaluation               AM-PAC PT "6 Clicks" Mobility  Outcome Measure Help needed turning from your back to your side while in a flat bed without using bedrails?: None Help needed moving from lying on your back to sitting on the side of a flat bed without using bedrails?: None Help needed moving to and from a bed to a chair (including a wheelchair)?: None Help needed standing up from a chair using your arms (e.g., wheelchair or  bedside chair)?: None Help needed to walk in hospital room?: A Little Help needed climbing 3-5 steps with a railing? : A Little 6 Click Score: 22    End of Session Equipment Utilized During Treatment: Gait belt;Back brace Activity Tolerance: Patient tolerated treatment well Patient left: with call bell/phone within reach (In bathroom) Nurse Communication: Mobility status PT Visit Diagnosis: Unsteadiness on feet (R26.81);Pain Pain - part of body:  (back)    Time: 2130-8657 PT Time Calculation (min) (ACUTE ONLY): 28 min   Charges:   PT Evaluation $PT Eval Low Complexity: 1 Low PT Treatments $Gait Training: 8-22 mins PT General Charges $$ ACUTE PT VISIT: 1 Visit         Conni Slipper, PT, DPT Acute Rehabilitation Services Secure Chat Preferred Office: [phone]   Rachel Crane 05/03/2023, 10:09 AM

## 2023-05-03 NOTE — Progress Notes (Signed)
This RN attempted to place IV. Upon assessment RN unsuccessful at placing IV so IV team consulted. IV team reached out to this RN and Rachel Marquis Lunch RN to verify if patient needed access. Rachel stated that the MD had told her since the patient's previous IV had infiltrated, that MD would place order for Percocet vs IV morphine although IV morphine still showing active. IV team RN verified with MD who stated no need for IV access.

## 2023-05-03 NOTE — Evaluation (Signed)
Occupational Therapy Evaluation Patient Details Name: Rachel Crane MRN: [id number] DOB: [birth year] Today's Date: 05/03/2023   History of Present Illness Rachel Crane is a 77 yo female who underwent L3-4 TLIF 10/17. PMHx: Lt TKA 06/06/20, Rt TKA 09/19/17, HTN, Aortic valve sclerosis, asthma, bilateral carotid bruits, HA, CPAP, shingles, pre-diabetes   Clinical Impression   [name] was evaluated s/p the above admission list. She lives alone and is indep at baseline. Upon evaluation the pt was limited by knowledge of spinal precautions, mild surgical pain and activity tolerance. Overall she was given generalized superivsion A for mobility for safety only, however demonstrated mod I ability without AD. Due to the deficits listed below the pt also needs verbal cues for ADLs to maintain spinal precautions with compensatory techniques, no physical assist or DME needed. Pt will benefit from continued acute OT services with discharge home and assist from family/friends intermittently - of note, pt requesting d/c to SNF but open to consider going home after discussion with OT about current functional level.         If plan is discharge home, recommend the following: Assistance with cooking/housework;Assist for transportation    Functional Status Assessment  Patient has had a recent decline in their functional status and demonstrates the ability to make significant improvements in function in a reasonable and predictable amount of time.        Precautions / Restrictions Precautions Precautions: Fall;Back Precaution Booklet Issued: Yes (comment) Precaution Comments: Reviewed handout and pt was cued for precautions during functional mobility. Required Braces or Orthoses: Spinal Brace Spinal Brace: Thoracolumbosacral orthotic;Applied in sitting position Restrictions Weight Bearing Restrictions: No      Mobility Bed Mobility Overal bed mobility: Modified Independent Bed Mobility: Rolling, Sidelying  to Sit           General bed mobility comments: mod I to get back into bed, good log roll    Transfers Overall transfer level: Modified independent Equipment used: None Transfers: Sit to/from Stand                    Balance Overall balance assessment: No apparent balance deficits (not formally assessed)               ADL either performed or assessed with clinical judgement   ADL Overall ADL's : Needs assistance/impaired Eating/Feeding: Independent   Functional mobility during ADLs: Supervision/safety General ADL Comments: no physical assist needed, cues fror spinal precautions and compensatory tehcniques given     Vision Baseline Vision/History: 1 Wears glasses Vision Assessment?: No apparent visual deficits     Perception Perception: Within Functional Limits       Praxis Praxis: WFL       Pertinent Vitals/Pain Pain Assessment Pain Assessment: Faces Faces Pain Scale: Hurts little more Pain Location: back Pain Descriptors / Indicators: Discomfort Pain Intervention(s): Monitored during session     Extremity/Trunk Assessment Upper Extremity Assessment Upper Extremity Assessment: Overall WFL for tasks assessed   Lower Extremity Assessment Lower Extremity Assessment: Generalized weakness (Mild; consistent with pre-op diagnosis)   Cervical / Trunk Assessment Cervical / Trunk Assessment: Back Surgery   Communication Communication Communication: No apparent difficulties   Cognition Arousal: Alert Behavior During Therapy: WFL for tasks assessed/performed Overall Cognitive Status: Within Functional Limits for tasks assessed                                 General Comments: Minimal  verbal cues needed for spinal precuaitons during ADLs - pt requesting SNF at d/c due to "not having anyone at home" - discussed pts current functional ability (mod I), and exercise restrictions     General Comments  VSS on RA     Home Living  Family/patient expects to be discharged to:: Private residence Living Arrangements: Alone Available Help at Discharge: Family;Available PRN/intermittently Type of Home: House Home Access: Stairs to enter [employer] of Steps: 1   Home Layout: One level     Bathroom Shower/Tub: Producer, television/film/video: Standard     Home Equipment: Agricultural consultant (2 wheels);BSC/3in1          Prior Functioning/Environment Prior Level of Function : Independent/Modified Independent;Driving;Working/employed             Mobility Comments: no AD; pt works 4 hours/day 2-3 days/week at [employer] ADLs Comments: indep ADLs/IADLs        OT Problem List: Decreased activity tolerance      OT Treatment/Interventions: Self-care/ADL training;DME and/or AE instruction;Patient/family education    OT Goals(Current goals can be found in the care plan section) Acute Rehab OT Goals Patient Stated Goal: to feel better OT Goal Formulation: With patient Time For Goal Achievement: 05/18/23 Potential to Achieve Goals: Good ADL Goals Additional ADL Goal #1: Pt will complete ADLs with mod I while maintaining spinal precautions  OT Frequency: Min 1X/week    Co-evaluation              AM-PAC OT "6 Clicks" Daily Activity     Outcome Measure Help from another person eating meals?: None Help from another person taking care of personal grooming?: None Help from another person toileting, which includes using toliet, bedpan, or urinal?: None Help from another person bathing (including washing, rinsing, drying)?: None Help from another person to put on and taking off regular upper body clothing?: None Help from another person to put on and taking off regular lower body clothing?: A Little 6 Click Score: 23   End of Session Equipment Utilized During Treatment: Back brace Nurse Communication: Mobility status  Activity Tolerance: Patient tolerated treatment well Patient left: in  bed;with call bell/phone within reach  OT Visit Diagnosis: Unsteadiness on feet (R26.81);Other abnormalities of gait and mobility (R26.89);Muscle weakness (generalized) (M62.81)                Time: 1610-9604 OT Time Calculation (min): 17 min Charges:  OT General Charges $OT Visit: 1 Visit OT Evaluation $OT Eval Low Complexity: 1 Low  Derenda Mis, OTR/L Acute Rehabilitation Services Office [phone] Secure Chat Communication Preferred   Donia Pounds 05/03/2023, 10:53 AM

## 2023-05-03 NOTE — Progress Notes (Signed)
    Patient doing well  Denies leg pain Has been ambulating   Physical Exam: Vitals:   05/03/23 0454 05/03/23 0714  BP: (!) 150/72 (!) 155/75  Pulse: 80 77  Resp: 20 20  Temp: 97.7 F (36.5 C) 98.3 F (36.8 C)  SpO2: 98% 99%    Dressing in place NVI  POD #1 s/p L3/4 decompression and fusion, doing well  - up with PT/OT, encourage ambulation - Percocet for pain, Robaxin for muscle spasms - d/c to rehab once bed avilable

## 2023-05-04 LAB — GLUCOSE, CAPILLARY
Glucose-Capillary: 115 mg/dL — ABNORMAL HIGH (ref 70–99)
Glucose-Capillary: 133 mg/dL — ABNORMAL HIGH (ref 70–99)
Glucose-Capillary: 122 mg/dL — ABNORMAL HIGH (ref 70–99)
Glucose-Capillary: 102 mg/dL — ABNORMAL HIGH (ref 70–99)

## 2023-05-04 MED ORDER — OXYCODONE HCL 5 MG PO TABS
10.0000 mg | ORAL_TABLET | ORAL | Status: DC | PRN
  Administered 2023-05-04 – 2023-05-08 (×17): 10 mg via ORAL
  Filled 2023-05-04 (×17): qty 2

## 2023-05-04 NOTE — Progress Notes (Signed)
Occupational Therapy Treatment Patient Details Name: Rachel Crane MRN: [id number] DOB: 04-01-1946 Today's Date: 05/04/2023   History of present illness Rachel Crane is a 77 yo female who underwent L3-4 TLIF 10/17. PMHx: Lt TKA 06/06/20, Rt TKA 09/19/17, HTN, Aortic valve sclerosis, asthma, bilateral carotid bruits, HA, CPAP, shingles, pre-diabetes   OT comments  Pt. Seen for skilled OT treatment session.  Pt. Perseverating on pain and her expectations for progress with mobility. Attempted to provide insight into these 2 topics reviewing that she is performing very well.  Reviewed with her all of the tasks she can complete and distance she is walking.  Pt. Unable to accept that this is well enough for home and that she is doing great.  Cont. To discuss her pain.  Reviewed that pain medicine will not take away all feelings and sensations.  Provided emotional support with her feelings and encouraged her to have positive thoughts regarding her abilities and progress.  Even with c/o pain pt. Able to complete bed mobility in/out of bed, and ambulate to/from b.room for toileting tasks with S.  Cues for sequencing of don/doff back brace.  Good integration of back precautions during functional mobility without cues.  Cont. With acute OT POC.       If plan is discharge home, recommend the following:  Assistance with cooking/housework;Assist for transportation   Equipment Recommendations    3n1   Recommendations for Other Services      Precautions / Restrictions Precautions Precautions: Fall;Back Precaution Booklet Issued: Yes (comment) Precaution Comments: good demo of precautions without cueing Required Braces or Orthoses: Spinal Brace Spinal Brace: Thoracolumbosacral orthotic;Applied in sitting position Restrictions Weight Bearing Restrictions: No       Mobility Bed Mobility Overal bed mobility: Needs Assistance Bed Mobility: Rolling, Sidelying to Sit, Sit to Sidelying Rolling: Contact  guard assist Sidelying to sit: Contact guard assist     Sit to sidelying: Contact guard assist General bed mobility comments: VC's for optimal log roll technique and hand placement on rise. No assist required, increased time to complete    Transfers Overall transfer level: Needs assistance Equipment used: Rolling walker (2 wheels) Transfers: Sit to/from Stand, Bed to chair/wheelchair/BSC Sit to Stand: Supervision     Step pivot transfers: Supervision     General transfer comment: Increased time but no assist required for power up to full stand, cues for hand placement, slow to rise from commode even with 3n1 over to aide in making taller     Balance                                           ADL either performed or assessed with clinical judgement   ADL Overall ADL's : Needs assistance/impaired                 Upper Body Dressing : Contact guard assist;Cueing for sequencing;Sitting Upper Body Dressing Details (indicate cue type and reason): intermittent cues and some light assistance for proper positioning and fastening of brace     Toilet Transfer: Supervision/safety;Ambulation   Toileting- Clothing Manipulation and Hygiene: Supervision/safety;Sit to/from stand Toileting - Clothing Manipulation Details (indicate cue type and reason): able to perform peri care and manage underwear without assistance     Functional mobility during ADLs: Supervision/safety General ADL Comments: no physical assist needed, cues fror spinal precautions and compensatory tehcniques given-education provided on uses  of 3n1 and how to properly place over commode    Extremity/Trunk Assessment              Vision       Perception     Praxis      Cognition Arousal: Alert Behavior During Therapy: WFL for tasks assessed/performed Overall Cognitive Status: Within Functional Limits for tasks assessed                                 General Comments:  Minimal verbal cues needed for spinal precuaitons during mobility        Exercises      Shoulder Instructions       General Comments VSS on RA    Pertinent Vitals/ Pain       Pain Assessment Pain Assessment: Faces Faces Pain Scale: Hurts worst Pain Location: back Pain Descriptors / Indicators: Aching  Home Living                                          Prior Functioning/Environment              Frequency  Min 1X/week        Progress Toward Goals  OT Goals(current goals can now be found in the care plan section)  Progress towards OT goals: Progressing toward goals     Plan      Co-evaluation                 AM-PAC OT "6 Clicks" Daily Activity     Outcome Measure   Help from another person eating meals?: None Help from another person taking care of personal grooming?: None Help from another person toileting, which includes using toliet, bedpan, or urinal?: None Help from another person bathing (including washing, rinsing, drying)?: None Help from another person to put on and taking off regular upper body clothing?: None Help from another person to put on and taking off regular lower body clothing?: A Little 6 Click Score: 23    End of Session Equipment Utilized During Treatment: Back brace;Gait belt;Rolling walker (2 wheels)  OT Visit Diagnosis: Unsteadiness on feet (R26.81);Other abnormalities of gait and mobility (R26.89);Muscle weakness (generalized) (M62.81)   Activity Tolerance Patient limited by pain   Patient Left in bed;with call bell/phone within reach;with bed alarm set   Nurse Communication Other (comment) (rn states ok to work with pt., also that pt. staying today not d/c secondary to pain)        Time: 4696-2952 OT Time Calculation (min): 26 min  Charges: OT General Charges $OT Visit: 1 Visit OT Treatments $Self Care/Home Management : 23-37 mins  Boneta Lucks, COTA/L Acute Rehabilitation [phone]    Alessandra Bevels Lorraine-COTA/L 05/04/2023, 12:01 PM

## 2023-05-04 NOTE — Progress Notes (Signed)
Patient c/o 10/10 pain intermittently throughout the night, attempted to get on top of pain and medicate Q2 hours however patient refused. Medicated per [facility] when patient request. On rounds around 630AM, patient asleep on CPAP. Relayed information to oncoming nurse

## 2023-05-04 NOTE — Progress Notes (Signed)
     Rachel Crane is a 77 y.o. female   Orthopaedic diagnosis: Status post L3/4 decompression and fusion 05/02/2023  Subjective: Patient complains of increased pain overnight and into this morning.  She did get up with physical therapy today,  No notable numbness and tingling or leg pain.  She describes spasms and pain at surgical site.  She was considering discharge home today with home health physical therapy but is worried about that due to her increased pain and she lives alone. She notes minimal output from the drain.  Objectyive: Vitals:   05/04/23 0508 05/04/23 0727  BP: (!) 123/49 (!) 145/60  Pulse: 67 78  Resp: 18 16  Temp: 98.1 F (36.7 C) 99.3 F (37.4 C)  SpO2: 97% 99%     Exam: Awake and alert Respirations even and unlabored No acute distress  Dressing clean, dry, and intact.  Minimal output noted in drained.  The drain was removed uneventfully.  Dressing was placed.  Patient tolerated this well.  Neurovascular intact in the bilateral lower extremities.  Assessment: Postop day 2 status post the above, doing well  Plan: Drain was pulled uneventfully at bedside.  She is having some more increased pain. We discussed discharge home versus continued stay for monitoring, pain control, and continued mobilization with physical therapy to ensure that she is safe at home discharge.  She would like to stay which is reasonable.  Continue PT/OT, encourage ambulation Percocet for pain, Robaxin for spasms  Current disposition recommendations are now discharged home with home health as she does not qualify for placement. We will plan to reevaluate for discharge tomorrow.   Jearldean Gutt J. [place], PA-C

## 2023-05-04 NOTE — Progress Notes (Addendum)
Physical Therapy Treatment Patient Details Name: Rachel Crane MRN: [id number] DOB: [birth year] Today's Date: 05/04/2023   History of Present Illness Rachel Crane is a 77 yo female who underwent L3-4 TLIF 10/17. PMHx: Lt TKA 06/06/20, Rt TKA 09/19/17, HTN, Aortic valve sclerosis, asthma, bilateral carotid bruits, HA, CPAP, shingles, pre-diabetes    PT Comments  Pt greeted resting in bed, reporting increased pain, however agreeable to session. Pt completing bed mobility at mod I level with light cues for optimal hand placement on rise to sitting, but no physical assist needed. Pt able to rise from EOB and low commode with increased time without physical assist. Pt requiring CGA for gait with RW support, per pt request due to increased pain, for hallway distance with no LOB, encouragement needed for increased distance. Encouraged and educated pt on importance of frequent mobility and medication management for pain with pt verbalizing understanding. Continued education on precautions, brace application/wearing schedule, appropriate activity progression, and car transfer. Current plan remains appropriate. Pt continues to benefit from skilled PT services to progress toward functional mobility goals.      If plan is discharge home, recommend the following: A little help with walking and/or transfers;Assistance with cooking/housework;Assist for transportation   Can travel by private vehicle        Equipment Recommendations  None recommended by PT (may need RW if continues to be limited by pain)    Recommendations for Other Services       Precautions / Restrictions Precautions Precautions: Fall;Back Precaution Booklet Issued: Yes (comment) Precaution Comments: Reviewed handout and pt was cued for precautions during functional mobility. Required Braces or Orthoses: Spinal Brace Spinal Brace: Thoracolumbosacral orthotic;Applied in sitting position Restrictions Weight Bearing Restrictions: No      Mobility  Bed Mobility Overal bed mobility: Modified Independent Bed Mobility: Rolling, Sidelying to Sit, Sit to Sidelying           General bed mobility comments: VC's for optimal log roll technique and hand placement on rise. No assist required, increased time to complete    Transfers Overall transfer level: Modified independent Equipment used: None, Rolling walker (2 wheels) Transfers: Sit to/from Stand             General transfer comment: Increased time but no assist required for power up to full stand, cues for hand placement, slow to rise from low commode    Ambulation/Gait Ambulation/Gait assistance: Contact guard assist Gait Distance (Feet): 225 Feet Assistive device: Rolling walker (2 wheels) Gait Pattern/deviations: Step-through pattern, Decreased stride length, Trunk flexed Gait velocity: Decreased     General Gait Details: No assist required and no overt LOB noted. pt requesting RW use due to increased pain   Stairs             Wheelchair Mobility     Tilt Bed    Modified Rankin (Stroke Patients Only)       Balance Overall balance assessment: No apparent balance deficits (not formally assessed)                                          Cognition Arousal: Alert Behavior During Therapy: WFL for tasks assessed/performed Overall Cognitive Status: Within Functional Limits for tasks assessed  General Comments: Minimal verbal cues needed for spinal precuaitons during mobility        Exercises      General Comments General comments (skin integrity, edema, etc.): VSS on RA      Pertinent Vitals/Pain Pain Assessment Pain Assessment: Faces Faces Pain Scale: Hurts worst Pain Location: back Pain Descriptors / Indicators: Sore Pain Intervention(s): Monitored during session, Limited activity within patient's tolerance    Home Living                           Prior Function            PT Goals (current goals can now be found in the care plan section) Acute Rehab PT Goals Patient Stated Goal: Go to SNF PT Goal Formulation: With patient Time For Goal Achievement: 05/10/23 Progress towards PT goals: Not progressing toward goals - comment (pain)    Frequency    Min 5X/week      PT Plan      Co-evaluation              AM-PAC PT "6 Clicks" Mobility   Outcome Measure  Help needed turning from your back to your side while in a flat bed without using bedrails?: None Help needed moving from lying on your back to sitting on the side of a flat bed without using bedrails?: None Help needed moving to and from a bed to a chair (including a wheelchair)?: None Help needed standing up from a chair using your arms (e.g., wheelchair or bedside chair)?: None Help needed to walk in hospital room?: A Little Help needed climbing 3-5 steps with a railing? : A Little 6 Click Score: 22    End of Session Equipment Utilized During Treatment: Gait belt;Back brace Activity Tolerance: Patient tolerated treatment well;Patient limited by pain Patient left: with call bell/phone within reach;in bed Nurse Communication: Mobility status PT Visit Diagnosis: Unsteadiness on feet (R26.81);Pain Pain - part of body:  (back)     Time: 4098-1191 PT Time Calculation (min) (ACUTE ONLY): 28 min  Charges:    $Gait Training: 8-22 mins $Therapeutic Activity: 8-22 mins PT General Charges $$ ACUTE PT VISIT: 1 Visit                     Chike Farrington R. PTA Acute Rehabilitation Services Office: [phone]   Catalina Antigua 05/04/2023, 10:48 AM

## 2023-05-05 LAB — GLUCOSE, CAPILLARY
Glucose-Capillary: 189 mg/dL — ABNORMAL HIGH (ref 70–99)
Glucose-Capillary: 159 mg/dL — ABNORMAL HIGH (ref 70–99)
Glucose-Capillary: 117 mg/dL — ABNORMAL HIGH (ref 70–99)

## 2023-05-05 MED ORDER — OXYCODONE HCL ER 10 MG PO T12A
10.0000 mg | EXTENDED_RELEASE_TABLET | Freq: Two times a day (BID) | ORAL | Status: DC
  Administered 2023-05-05 – 2023-05-09 (×9): 10 mg via ORAL
  Filled 2023-05-05 (×9): qty 1

## 2023-05-05 NOTE — Progress Notes (Signed)
Physical Therapy Treatment Patient Details Name: Rachel Crane MRN: [id number] DOB: [birth year] Today's Date: 05/05/2023   History of Present Illness Rachel Crane is a 77 yo female who underwent L3-4 TLIF 10/17. PMHx: Lt TKA 06/06/20, Rt TKA 09/19/17, HTN, Aortic valve sclerosis, asthma, bilateral carotid bruits, HA, CPAP, shingles, pre-diabetes    PT Comments  Pt reporting severe back pain, moaning throughout session. Pt ambulated short hallway distance, requiring light assist for transfers and close guarding during gait given pt-reported dizziness (BP stable). Pt states "I can't go home like this, I live alone", states she cannot d/c home today. PT to continue to follow while acute.       If plan is discharge home, recommend the following: A little help with walking and/or transfers;Assistance with cooking/housework;Assist for transportation   Can travel by private vehicle        Equipment Recommendations  Rolling walker (2 wheels)    Recommendations for Other Services       Precautions / Restrictions Precautions Precautions: Fall;Back Precaution Comments: good demo of precautions without cueing Required Braces or Orthoses: Spinal Brace Spinal Brace: Thoracolumbosacral orthotic;Applied in sitting position Restrictions Weight Bearing Restrictions: No     Mobility  Bed Mobility Overal bed mobility: Needs Assistance Bed Mobility: Rolling, Sidelying to Sit, Sit to Sidelying Rolling: Supervision Sidelying to sit: Supervision     Sit to sidelying: Supervision General bed mobility comments: for safety, cues for sequencing task    Transfers Overall transfer level: Needs assistance Equipment used: Rolling walker (2 wheels) Transfers: Sit to/from Stand Sit to Stand: Min assist           General transfer comment: light rise and lower assist, cues for hand placement. increased time    Ambulation/Gait Ambulation/Gait assistance: Contact guard assist Gait Distance  (Feet): 90 Feet Assistive device: Rolling walker (2 wheels) Gait Pattern/deviations: Step-through pattern, Decreased stride length, Trunk flexed Gait velocity: decr     General Gait Details: cues for upright posture, placemetn in RW. Pt reporting dizziness, BP 148/62   Stairs             Wheelchair Mobility     Tilt Bed    Modified Rankin (Stroke Patients Only)       Balance Overall balance assessment: Mild deficits observed, not formally tested                                          Cognition Arousal: Alert Behavior During Therapy: WFL for tasks assessed/performed Overall Cognitive Status: Impaired/Different from baseline Area of Impairment: Problem solving                             Problem Solving: Slow processing, Decreased initiation, Requires verbal cues, Requires tactile cues General Comments: pt can be self-limiting, moaning throughout session at times requiring increased processing time or repeated cues        Exercises      General Comments        Pertinent Vitals/Pain Pain Assessment Pain Assessment: 0-10 Pain Score: 10-Worst pain ever Pain Location: back Pain Descriptors / Indicators: Aching, Grimacing, Guarding Pain Intervention(s): Limited activity within patient's tolerance, Monitored during session, Repositioned    Home Living  Prior Function            PT Goals (current goals can now be found in the care plan section) Acute Rehab PT Goals Patient Stated Goal: Go to SNF PT Goal Formulation: With patient Time For Goal Achievement: 05/10/23 Potential to Achieve Goals: Good Progress towards PT goals: Not progressing toward goals - comment (pt reporting severe back pain, states "I can't go home like this")    Frequency           PT Plan      Co-evaluation              AM-PAC PT "6 Clicks" Mobility   Outcome Measure  Help needed turning from your  back to your side while in a flat bed without using bedrails?: A Little Help needed moving from lying on your back to sitting on the side of a flat bed without using bedrails?: A Little Help needed moving to and from a bed to a chair (including a wheelchair)?: A Little Help needed standing up from a chair using your arms (e.g., wheelchair or bedside chair)?: A Little Help needed to walk in hospital room?: A Little Help needed climbing 3-5 steps with a railing? : A Lot 6 Click Score: 17    End of Session Equipment Utilized During Treatment: Back brace Activity Tolerance: Patient tolerated treatment well;Patient limited by pain Patient left: with call bell/phone within reach;in bed;with bed alarm set Nurse Communication: Mobility status PT Visit Diagnosis: Unsteadiness on feet (R26.81);Pain Pain - part of body:  (low back)     Time: 5366-4403 PT Time Calculation (min) (ACUTE ONLY): 17 min  Charges:    $Therapeutic Activity: 8-22 mins PT General Charges $$ ACUTE PT VISIT: 1 Visit                     Marye Round, PT DPT Acute Rehabilitation Services Secure Chat Preferred  Office [phone]    Syrah Daughtrey Sheliah Plane 05/05/2023, 12:29 PM

## 2023-05-05 NOTE — Progress Notes (Addendum)
     Rachel Crane is a 77 y.o. female   Orthopaedic diagnosis: Status post L3/4 decompression and fusion 05/02/2023  Subjective: Patient resting in bed with a friend at bedside.  Continues to complain of pain in the low back about the surgical site.  She had increased pain with physical therapy yesterday.  She is fearful of discharge home due to ongoing pain.  She lives alone.  No notable numbness or tingling in the legs.  No new concerns today otherwise.  Objectyive: Vitals:   05/05/23 0404 05/05/23 0811  BP: (!) 156/70 (!) 141/76  Pulse: 90 97  Resp:  16  Temp: 99.2 F (37.3 C) 99.1 F (37.3 C)  SpO2: 96% 100%     Exam: Awake and alert Respirations even and unlabored No acute distress  Dressing clean, dry, and intact.  No notable swelling.  Neurovascular intact in the bilateral lower extremities.  Assessment: Postop day 3 status post above, doing well  Plan: Patient overall doing well.  She will work with physical therapy today and see how things go.  If she does well and pain is controlled we will consider discharge home with home health therapy versus continued stay for monitoring and pain control.  She does not qualify for rehab placement.  I will check on her this afternoon.  Patient understanding and agreeable to our plan today.  Addendum: I spoke with the patient this afternoon regarding disposition home.  She is having some continued difficulty with pain control and mobilization.  We will keep her and revisit discharge in the a.m.  Ervin Rothbauer J. [place], PA-C

## 2023-05-05 NOTE — Plan of Care (Signed)

## 2023-05-06 LAB — GLUCOSE, CAPILLARY
Glucose-Capillary: 111 mg/dL — ABNORMAL HIGH (ref 70–99)
Glucose-Capillary: 136 mg/dL — ABNORMAL HIGH (ref 70–99)
Glucose-Capillary: 140 mg/dL — ABNORMAL HIGH (ref 70–99)
Glucose-Capillary: 166 mg/dL — ABNORMAL HIGH (ref 70–99)
Glucose-Capillary: 185 mg/dL — ABNORMAL HIGH (ref 70–99)
Glucose-Capillary: 223 mg/dL — ABNORMAL HIGH (ref 70–99)

## 2023-05-06 MED ORDER — DIAZEPAM 5 MG PO TABS
5.0000 mg | ORAL_TABLET | Freq: Four times a day (QID) | ORAL | Status: DC | PRN
  Administered 2023-05-06 – 2023-05-09 (×7): 5 mg via ORAL
  Filled 2023-05-06 (×7): qty 1

## 2023-05-06 MED ORDER — DIAZEPAM 5 MG PO TABS: 5.0000 mg | ORAL_TABLET | Freq: Four times a day (QID) | ORAL | 0 refills | Status: DC | PRN

## 2023-05-06 NOTE — Plan of Care (Signed)
°  Problem: Clinical Measurements: °Goal: Cardiovascular complication will be avoided °Outcome: Progressing °  °Problem: Activity: °Goal: Risk for activity intolerance will decrease °Outcome: Progressing °  °

## 2023-05-06 NOTE — Progress Notes (Signed)
    Patient with some improvement today but continues to be severely limited secondary to LBP and does not feel comfortable progressing home alone, not a candidate for rehab facility, plan is to D/C home with [facility] once cleared by PT and pain controlled   Physical Exam: Vitals:   05/06/23 0737 05/06/23 0810  BP:  (!) 128/55  Pulse:  100  Resp:  18  Temp:  98.2 F (36.8 C)  SpO2: 97% 95%    Dressing in place NVI  POD #4 s/p lumbar fusion, PT progress limited by LBP  - up with PT/OT, encourage ambulation - Percocet for pain, Valium for muscle spasms, Oxycontin Q12  - likely d/c home tomorrow with f/u in 2 weeks

## 2023-05-06 NOTE — Progress Notes (Signed)
Physical Therapy Treatment Patient Details Name: Rachel Crane MRN: [id number] DOB: [birth year] Today's Date: 05/06/2023   History of Present Illness Rachel Crane is a 77 yo female who underwent L3-4 TLIF 10/17. PMHx: Lt TKA 06/06/20, Rt TKA 09/19/17, HTN, Aortic valve sclerosis, asthma, bilateral carotid bruits, HA, CPAP, shingles, pre-diabetes    PT Comments  Pt pleasant and reports pain limiting all aspects of function. Pt educated for precautions, brace wear, transfers and gait with pt walking further than last session but states she still feels hesitant for return home alone. Pt encouraged to be up for meals and progress gait.    If plan is discharge home, recommend the following: A little help with walking and/or transfers;Assistance with cooking/housework;Assist for transportation   Can travel by private vehicle        Equipment Recommendations  Rolling walker (2 wheels);BSC/3in1    Recommendations for Other Services       Precautions / Restrictions Precautions Precautions: Fall;Back Precaution Comments: pt able to state 2/3 precautions with cues for no twisting vs "stretching" Required Braces or Orthoses: Spinal Brace Spinal Brace: Thoracolumbosacral orthotic;Applied in sitting position     Mobility  Bed Mobility Overal bed mobility: Needs Assistance Bed Mobility: Sidelying to Sit           General bed mobility comments: pt semisidelying on left on arrival with cues for sequence and safety to fully roll to left and rise from surface with increased time    Transfers Overall transfer level: Needs assistance   Transfers: Sit to/from Stand Sit to Stand: Supervision           General transfer comment: cues for hand placement    Ambulation/Gait Ambulation/Gait assistance: Supervision Gait Distance (Feet): 200 Feet Assistive device: Rolling walker (2 wheels) Gait Pattern/deviations: Step-through pattern, Decreased stride length   Gait velocity  interpretation: <1.8 ft/sec, indicate of risk for recurrent falls   General Gait Details: pt with good posture, slow speed, self directing distance with report of bil hip pain, limited by pain/fatigue   Stairs             Wheelchair Mobility     Tilt Bed    Modified Rankin (Stroke Patients Only)       Balance Overall balance assessment: Mild deficits observed, not formally tested                                          Cognition Arousal: Alert Behavior During Therapy: WFL for tasks assessed/performed Overall Cognitive Status: Impaired/Different from baseline                               Problem Solving: Slow processing, Requires verbal cues          Exercises      General Comments        Pertinent Vitals/Pain Pain Assessment Pain Score: 9  Pain Location: back Pain Descriptors / Indicators: Aching, Grimacing, Guarding Pain Intervention(s): Limited activity within patient's tolerance, Monitored during session, Premedicated before session, Repositioned, RN gave pain meds during session (pt premedicated with oxy and muscle relaxer EOB)    Home Living                          Prior Function  PT Goals (current goals can now be found in the care plan section) Progress towards PT goals: Progressing toward goals    Frequency           PT Plan      Co-evaluation              AM-PAC PT "6 Clicks" Mobility   Outcome Measure  Help needed turning from your back to your side while in a flat bed without using bedrails?: A Little Help needed moving from lying on your back to sitting on the side of a flat bed without using bedrails?: A Little Help needed moving to and from a bed to a chair (including a wheelchair)?: A Little Help needed standing up from a chair using your arms (e.g., wheelchair or bedside chair)?: A Little Help needed to walk in hospital room?: A Little Help needed climbing 3-5  steps with a railing? : A Little 6 Click Score: 18    End of Session Equipment Utilized During Treatment: Back brace Activity Tolerance: Patient tolerated treatment well;Patient limited by fatigue Patient left: with call bell/phone within reach;in chair Nurse Communication: Mobility status PT Visit Diagnosis: Other abnormalities of gait and mobility (R26.89)     Time: 1610-9604 PT Time Calculation (min) (ACUTE ONLY): 30 min  Charges:    $Gait Training: 8-22 mins $Therapeutic Activity: 8-22 mins PT General Charges $$ ACUTE PT VISIT: 1 Visit                     Merryl Hacker, PT Acute Rehabilitation Services Office: [phone]    Rachel Crane 05/06/2023, 8:59 AM

## 2023-05-06 NOTE — Care Management Important Message (Signed)
Important Message  Patient Details  Name: Rachel Crane MRN: [id number] Date of Birth: [birth year]   Important Message Given:  Yes - Medicare IM     Dorena Bodo 05/06/2023, 3:03 PM

## 2023-05-06 NOTE — Progress Notes (Signed)
Occupational Therapy Treatment Patient Details Name: Rachel Crane MRN: [id number] DOB: 1945-10-23 Today's Date: 05/06/2023   History of present illness MAZEE SEIDEMAN is a 77 yo female who underwent L3-4 TLIF 10/17. PMHx: Lt TKA 06/06/20, Rt TKA 09/19/17, HTN, Aortic valve sclerosis, asthma, bilateral carotid bruits, HA, CPAP, shingles, pre-diabetes   OT comments  Pt is making good progress towards their acute OT goals. Pt completed all mobility with supervision A and RW and tolerated >household distance without a rest break. She also completed toileting, hygiene and grooming with supervision A and good maintenance of back precautions. Pt did need total A to doff/don socks due to pain with figure four position, she will benefit from AE education to increased indep with LB ADLs. OT to continue to follow acutely to facilitate progress towards established goals. Pt will continue to benefit from discharge home but pt remains hesitant.       If plan is discharge home, recommend the following:  Assistance with cooking/housework;Assist for transportation         Precautions / Restrictions Precautions Precautions: Fall;Back Precaution Booklet Issued: Yes (comment) Spinal Brace: Thoracolumbosacral orthotic;Applied in sitting position Restrictions Weight Bearing Restrictions: No       Mobility Bed Mobility Overal bed mobility: Needs Assistance Bed Mobility: Rolling, Sidelying to Sit Rolling: Supervision Sidelying to sit: Supervision       General bed mobility comments: no phyiscal assist needed    Transfers Overall transfer level: Needs assistance Equipment used: Rolling walker (2 wheels) Transfers: Sit to/from Stand Sit to Stand: Supervision           General transfer comment: serveral transfers throughout         ADL either performed or assessed with clinical judgement   ADL Overall ADL's : Needs assistance/impaired     Grooming: Supervision/safety;Standing            Upper Body Dressing : Supervision/safety Upper Body Dressing Details (indicate cue type and reason): no assist needed for brace management Lower Body Dressing: Total assistance Lower Body Dressing Details (indicate cue type and reason): total A for socks Toilet Transfer: Supervision/safety;Ambulation;Rolling walker (2 wheels)   Toileting- Clothing Manipulation and Hygiene: Modified independent;Sitting/lateral lean       Functional mobility during ADLs: Supervision/safety;Rolling walker (2 wheels) General ADL Comments: assist for socks only, pt not unable to get into figure 4. she will benefit from sock aide and reacher    Extremity/Trunk Assessment Upper Extremity Assessment Upper Extremity Assessment: Overall WFL for tasks assessed   Lower Extremity Assessment Lower Extremity Assessment: Defer to PT evaluation        Vision   Vision Assessment?: No apparent visual deficits   Perception Perception Perception: Within Functional Limits   Praxis Praxis Praxis: WFL    Cognition Arousal: Alert Behavior During Therapy: WFL for tasks assessed/performed Overall Cognitive Status: Impaired/Different from baseline Area of Impairment: Safety/judgement                         Safety/Judgement: Decreased awareness of safety, Decreased awareness of deficits     General Comments: can be self limiting at times, needs encouragement to particiapte              General Comments VSS on RA    Pertinent Vitals/ Pain       Pain Assessment Pain Assessment: Faces Faces Pain Scale: Hurts little more Pain Location: back Pain Descriptors / Indicators: Aching, Grimacing, Guarding Pain Intervention(s): Limited activity  within patient's tolerance, Monitored during session   Frequency  Min 1X/week        Progress Toward Goals  OT Goals(current goals can now be found in the care plan section)  Progress towards OT goals: Progressing toward goals  Acute Rehab OT  Goals Patient Stated Goal: less pain OT Goal Formulation: With patient Time For Goal Achievement: 05/18/23 Potential to Achieve Goals: Good ADL Goals Additional ADL Goal #1: Pt will complete ADLs with mod I while maintaining spinal precautions   AM-PAC OT "6 Clicks" Daily Activity     Outcome Measure   Help from another person eating meals?: None Help from another person taking care of personal grooming?: None Help from another person toileting, which includes using toliet, bedpan, or urinal?: None Help from another person bathing (including washing, rinsing, drying)?: None Help from another person to put on and taking off regular upper body clothing?: None Help from another person to put on and taking off regular lower body clothing?: A Little 6 Click Score: 23    End of Session Equipment Utilized During Treatment: Back brace  OT Visit Diagnosis: Unsteadiness on feet (R26.81);Other abnormalities of gait and mobility (R26.89);Muscle weakness (generalized) (M62.81)   Activity Tolerance Patient limited by pain   Patient Left in chair;with call bell/phone within reach   Nurse Communication Mobility status        Time: 1450-1515 OT Time Calculation (min): 25 min  Charges: OT General Charges $OT Visit: 1 Visit OT Treatments $Self Care/Home Management : 8-22 mins $Therapeutic Activity: 8-22 mins  Derenda Mis, OTR/L Acute Rehabilitation Services Office [phone] Secure Chat Communication Preferred   Donia Pounds 05/06/2023, 3:58 PM

## 2023-05-07 ENCOUNTER — Telehealth (HOSPITAL_COMMUNITY): Payer: Self-pay | Admitting: Pharmacy Technician

## 2023-05-07 ENCOUNTER — Other Ambulatory Visit (HOSPITAL_COMMUNITY): Payer: Self-pay

## 2023-05-07 ENCOUNTER — Encounter (HOSPITAL_COMMUNITY): Payer: Self-pay | Admitting: Orthopedic Surgery

## 2023-05-07 LAB — GLUCOSE, CAPILLARY
Glucose-Capillary: 110 mg/dL — ABNORMAL HIGH (ref 70–99)
Glucose-Capillary: 121 mg/dL — ABNORMAL HIGH (ref 70–99)
Glucose-Capillary: 124 mg/dL — ABNORMAL HIGH (ref 70–99)
Glucose-Capillary: 126 mg/dL — ABNORMAL HIGH (ref 70–99)

## 2023-05-07 MED ORDER — OXYCODONE-ACETAMINOPHEN 5-325 MG PO TABS
1.0000 | ORAL_TABLET | Freq: Four times a day (QID) | ORAL | 0 refills | Status: DC | PRN
  Filled 2023-05-07: qty 57, 7d supply, fill #0

## 2023-05-07 MED ORDER — ONDANSETRON HCL 4 MG PO TABS
4.0000 mg | ORAL_TABLET | Freq: Four times a day (QID) | ORAL | 0 refills | Status: DC | PRN
  Filled 2023-05-07: qty 20, 5d supply, fill #0

## 2023-05-07 MED ORDER — OXYCODONE HCL ER 10 MG PO T12A
10.0000 mg | EXTENDED_RELEASE_TABLET | Freq: Two times a day (BID) | ORAL | 0 refills | Status: AC
  Filled 2023-05-07: qty 10, 5d supply, fill #0

## 2023-05-07 MED ORDER — DIAZEPAM 5 MG PO TABS
5.0000 mg | ORAL_TABLET | Freq: Four times a day (QID) | ORAL | 0 refills | Status: DC | PRN
  Filled 2023-05-07: qty 40, 10d supply, fill #0

## 2023-05-07 NOTE — Telephone Encounter (Signed)
Pharmacy Patient Advocate Encounter  Received notification from [facility] that Prior Authorization for diazePAM 5MG  tablets  has been APPROVED from 05/07/2023 to 08/05/2023   PA #/Case ID/Reference #: [id number]

## 2023-05-07 NOTE — Plan of Care (Signed)
  Problem: Education: Goal: Knowledge of General Education information will improve Description: Including pain rating scale, medication(s)/side effects and non-pharmacologic comfort measures Outcome: Progressing   Problem: Health Behavior/Discharge Planning: Goal: Ability to manage health-related needs will improve Outcome: Progressing   Problem: Clinical Measurements: Goal: Will remain free from infection Outcome: Progressing Goal: Diagnostic test results will improve Outcome: Progressing Goal: Respiratory complications will improve Outcome: Progressing Goal: Cardiovascular complication will be avoided Outcome: Progressing   Problem: Activity: Goal: Risk for activity intolerance will decrease Outcome: Progressing   Problem: Nutrition: Goal: Adequate nutrition will be maintained Outcome: Progressing   Problem: Coping: Goal: Level of anxiety will decrease Outcome: Progressing   Problem: Elimination: Goal: Will not experience complications related to urinary retention Outcome: Progressing   Problem: Pain Managment: Goal: General experience of comfort will improve Outcome: Progressing   Problem: Safety: Goal: Ability to remain free from injury will improve Outcome: Progressing   Problem: Skin Integrity: Goal: Risk for impaired skin integrity will decrease Outcome: Progressing   Problem: Education: Goal: Ability to verbalize activity precautions or restrictions will improve Outcome: Progressing Goal: Knowledge of the prescribed therapeutic regimen will improve Outcome: Progressing Goal: Understanding of discharge needs will improve Outcome: Progressing   Problem: Activity: Goal: Ability to avoid complications of mobility impairment will improve Outcome: Progressing Goal: Ability to tolerate increased activity will improve Outcome: Progressing Goal: Will remain free from falls Outcome: Progressing   Problem: Bowel/Gastric: Goal: Gastrointestinal status for  postoperative course will improve Outcome: Progressing   Problem: Clinical Measurements: Goal: Ability to maintain clinical measurements within normal limits will improve Outcome: Progressing Goal: Postoperative complications will be avoided or minimized Outcome: Progressing Goal: Diagnostic test results will improve Outcome: Progressing   Problem: Pain Management: Goal: Pain level will decrease Outcome: Progressing   Problem: Skin Integrity: Goal: Will show signs of wound healing Outcome: Progressing   Problem: Health Behavior/Discharge Planning: Goal: Identification of resources available to assist in meeting health care needs will improve Outcome: Progressing   Problem: Bladder/Genitourinary: Goal: Urinary functional status for postoperative course will improve Outcome: Progressing

## 2023-05-07 NOTE — Progress Notes (Signed)
    Patient PO day 5 S/P L3/4 decompression and fusion. Pt with continued slow progress with PT. She continues to be resistant to D/C home but was not a SNF candidate. She walked 396ft today with PT and has continued to make progress. She states she has friends and family that can come and check on her. She has gotten better pain control after addition of Oxycontin Q12 and Valium to replace robaxin. She has not yet had a BM since arriving to hospital and reports som mild constipation symptoms of abdominal discomfort.    Physical Exam: Vitals:   05/07/23 0730 05/07/23 0742  BP: 137/61   Pulse: 94   Resp: 18   Temp: 98 F (36.7 C)   SpO2: 98% 94%    Dressing in place, CDI, pt resting in bed moving freely about bed NVI  POD # s/p L3/4 decompression and fusion. Resolved leg pain, greater than expected PO LBP with slow progress in PT/OT. Pt not a SNF candidate   - Remove outer/bulky dressing today, pt can shower normally over steri strips at this venture - Utilize enema for constipation symptoms   - up with PT/OT, encourage ambulation, continued progress  -Complete PT/OT care tomorrow with D/C home tomorrow with [facility] - Percocet for pain, Oxycontin q 12 added for pain, Valium for muscle spasms - d/c home tomorrow post PT/OT, pt aware she is going home tomorrow, that we will no longer have a medical reason to keep her - F/u in office 2 weeks PO

## 2023-05-07 NOTE — Progress Notes (Signed)
Physical Therapy Treatment Patient Details Name: Rachel Crane MRN: [id number] DOB: [birth year] Today's Date: 05/07/2023   History of Present Illness Rachel Crane is a 77 yo female who underwent L3-4 TLIF 10/17. PMHx: Lt TKA 06/06/20, Rt TKA 09/19/17, HTN, Aortic valve sclerosis, asthma, bilateral carotid bruits, HA, CPAP, shingles, pre-diabetes    PT Comments  Pt prone on arrival with education for positioning and precautions. Pt needing limited assist with bed mobility and donning brace but performing standing and gait without physical assist with use of rW. Pt with increased gait distance and encouraged OOB for toileting and meals. Plan remains appropriate.     If plan is discharge home, recommend the following: A little help with walking and/or transfers;Assistance with cooking/housework;Assist for transportation   Can travel by private vehicle        Equipment Recommendations  Rolling walker (2 wheels);BSC/3in1    Recommendations for Other Services       Precautions / Restrictions Precautions Precautions: Fall;Back Precaution Comments: pt able to state 3/3 precautions with education throughout to maintain Spinal Brace: Thoracolumbosacral orthotic;Applied in sitting position Restrictions Weight Bearing Restrictions: No     Mobility  Bed Mobility Overal bed mobility: Needs Assistance Bed Mobility: Rolling, Sidelying to Sit           General bed mobility comments: pt prone on arrival with supervision to roll to side and rise from side. min assist to scoot hips toward EOB to rise due to being on far side of bed after prone to sidelying    Transfers Overall transfer level: Modified independent                 General transfer comment: pt able to stand without assist or cues    Ambulation/Gait Ambulation/Gait assistance: Modified independent (Device/Increase time) Gait Distance (Feet): 350 Feet Assistive device: Rolling walker (2 wheels) Gait  Pattern/deviations: Step-through pattern, Decreased stride length   Gait velocity interpretation: <1.8 ft/sec, indicate of risk for recurrent falls   General Gait Details: pt with good posture, slow speed, self directing distance with increased tolerance   Stairs             Wheelchair Mobility     Tilt Bed    Modified Rankin (Stroke Patients Only)       Balance Overall balance assessment: Mild deficits observed, not formally tested                                          Cognition Arousal: Alert Behavior During Therapy: WFL for tasks assessed/performed Overall Cognitive Status: Impaired/Different from baseline Area of Impairment: Safety/judgement                               General Comments: can be self limiting at times, needs encouragement to particiapte        Exercises General Exercises - Lower Extremity Short Arc Quad: AROM, Both, 10 reps, Seated    General Comments        Pertinent Vitals/Pain Pain Assessment Pain Score: 9  Pain Location: back Pain Descriptors / Indicators: Aching, Guarding Pain Intervention(s): Limited activity within patient's tolerance, Monitored during session, Premedicated before session, Repositioned    Home Living  Prior Function            PT Goals (current goals can now be found in the care plan section) Progress towards PT goals: Progressing toward goals    Frequency    Min 5X/week      PT Plan      Co-evaluation              AM-PAC PT "6 Clicks" Mobility   Outcome Measure  Help needed turning from your back to your side while in a flat bed without using bedrails?: A Little Help needed moving from lying on your back to sitting on the side of a flat bed without using bedrails?: A Little Help needed moving to and from a bed to a chair (including a wheelchair)?: None Help needed standing up from a chair using your arms (e.g.,  wheelchair or bedside chair)?: None Help needed to walk in hospital room?: None Help needed climbing 3-5 steps with a railing? : A Little 6 Click Score: 21    End of Session Equipment Utilized During Treatment: Back brace Activity Tolerance: Patient tolerated treatment well Patient left: with call bell/phone within reach;in chair Nurse Communication: Mobility status PT Visit Diagnosis: Other abnormalities of gait and mobility (R26.89)     Time: 3235-5732 PT Time Calculation (min) (ACUTE ONLY): 35 min  Charges:                            Merryl Hacker, PT Acute Rehabilitation Services Office: [phone]    Enedina Finner Terah Robey 05/07/2023, 8:42 AM

## 2023-05-07 NOTE — Telephone Encounter (Signed)
Pharmacy Patient Advocate Encounter   Received notification from CoverMyMeds that prior authorization for diazePAM 5MG  tablets is required/requested.   Insurance verification completed.   The patient is insured through [facility] .   Per test claim: PA required; PA submitted to [facility] via CoverMyMeds Key/confirmation #/EOC [id number] Status is pending

## 2023-05-07 NOTE — Plan of Care (Signed)
  Problem: Activity: Goal: Risk for activity intolerance will decrease Outcome: Progressing   Problem: Nutrition: Goal: Adequate nutrition will be maintained Outcome: Progressing   Problem: Coping: Goal: Level of anxiety will decrease Outcome: Progressing   

## 2023-05-08 ENCOUNTER — Inpatient Hospital Stay (HOSPITAL_COMMUNITY): Payer: Medicare Other

## 2023-05-08 ENCOUNTER — Other Ambulatory Visit (HOSPITAL_COMMUNITY): Payer: Self-pay

## 2023-05-08 MED ORDER — MAGNESIUM CITRATE PO SOLN
1.0000 | Freq: Once | ORAL | Status: AC
  Administered 2023-05-08: 1 via ORAL
  Filled 2023-05-08: qty 296

## 2023-05-08 MED ORDER — SORBITOL 70 % SOLN
960.0000 mL | TOPICAL_OIL | Freq: Once | ORAL | Status: AC
  Administered 2023-05-08: 960 mL via RECTAL
  Filled 2023-05-08: qty 240

## 2023-05-08 NOTE — Plan of Care (Signed)

## 2023-05-08 NOTE — Progress Notes (Signed)
Occupational Therapy Treatment Patient Details Name: Rachel Crane MRN: [id number] DOB: 16-Aug-1945 Today's Date: 05/08/2023   History of present illness Rachel Crane is a 77 yo female who underwent L3-4 TLIF 10/17. PMHx: Lt TKA 06/06/20, Rt TKA 09/19/17, HTN, Aortic valve sclerosis, asthma, bilateral carotid bruits, HA, CPAP, shingles, pre-diabetes   OT comments  Pt is making good progress towards their acute OT goals. She continues to complete all mobility without physical assist or verbal cues. She had better ability to complete figure four position today without discomfort in her back. Session limited due to pts discomfort with constipation, MD in room at the end of the session to address concern. OT to continue to follow acutely to facilitate progress towards established goals. Pt will continue to benefit from discharge home.        If plan is discharge home, recommend the following:  Assistance with cooking/housework;Assist for transportation         Precautions / Restrictions Precautions Precautions: Fall;Back Precaution Booklet Issued: Yes (comment) Required Braces or Orthoses: Spinal Brace Spinal Brace: Thoracolumbosacral orthotic;Applied in sitting position Restrictions Weight Bearing Restrictions: No       Mobility Bed Mobility Overal bed mobility: Needs Assistance Bed Mobility: Rolling, Sidelying to Sit Rolling: Supervision Sidelying to sit: Supervision     Sit to sidelying: Supervision      Transfers Overall transfer level: Modified independent                       Balance Overall balance assessment: Mild deficits observed, not formally tested                 ADL either performed or assessed with clinical judgement   ADL Overall ADL's : Needs assistance/impaired                                       General ADL Comments: continues to only need generalized supervision A for ADLs, better ability to get into figure four  position for LB ADLs. Session limited due to pt's complaint of feeling constipated    Extremity/Trunk Assessment Upper Extremity Assessment Upper Extremity Assessment: Overall WFL for tasks assessed   Lower Extremity Assessment Lower Extremity Assessment: Defer to PT evaluation        Vision   Vision Assessment?: No apparent visual deficits   Perception Perception Perception: Within Functional Limits   Praxis Praxis Praxis: WFL    Cognition Arousal: Alert Behavior During Therapy: WFL for tasks assessed/performed Overall Cognitive Status: Impaired/Different from baseline Area of Impairment: Safety/judgement                         Safety/Judgement: Decreased awareness of safety, Decreased awareness of deficits   Problem Solving: Slow processing, Requires verbal cues General Comments: can be self limiting at times, needs encouragement to particiapte              General Comments VSS on RA    Pertinent Vitals/ Pain       Pain Assessment Pain Assessment: Faces Faces Pain Scale: Hurts little more Pain Location: constipation Pain Descriptors / Indicators: Aching, Guarding Pain Intervention(s): Limited activity within patient's tolerance, Monitored during session   Frequency  Min 1X/week        Progress Toward Goals  OT Goals(current goals can now be found in the care plan section)  Progress  towards OT goals: Progressing toward goals  Acute Rehab OT Goals Patient Stated Goal: to go home tomorrow OT Goal Formulation: With patient Time For Goal Achievement: 05/18/23 Potential to Achieve Goals: Good ADL Goals Additional ADL Goal #1: Pt will complete ADLs with mod I while maintaining spinal precautions   AM-PAC OT "6 Clicks" Daily Activity     Outcome Measure   Help from another person eating meals?: None Help from another person taking care of personal grooming?: None Help from another person toileting, which includes using toliet, bedpan, or  urinal?: None Help from another person bathing (including washing, rinsing, drying)?: None Help from another person to put on and taking off regular upper body clothing?: None Help from another person to put on and taking off regular lower body clothing?: A Little 6 Click Score: 23    End of Session Equipment Utilized During Treatment: Back brace;Rolling walker (2 wheels)  OT Visit Diagnosis: Unsteadiness on feet (R26.81);Other abnormalities of gait and mobility (R26.89);Muscle weakness (generalized) (M62.81)   Activity Tolerance Patient limited by pain   Patient Left in bed;with call bell/phone within reach   Nurse Communication Mobility status        Time: 2841-3244 OT Time Calculation (min): 13 min  Charges: OT General Charges $OT Visit: 1 Visit OT Treatments $Self Care/Home Management : 8-22 mins  Derenda Mis, OTR/L Acute Rehabilitation Services Office [phone] Secure Chat Communication Preferred   Donia Pounds 05/08/2023, 3:11 PM

## 2023-05-08 NOTE — Progress Notes (Signed)
PT Cancellation Note  Patient Details Name: Rachel Crane MRN: [id number] DOB: October 27, 1945   Cancelled Treatment:    Reason Eval/Treat Not Completed: (P) Other (comment), pt declining mobility due to discomfort from enema and continued constipation. Educated pt on importance of OOB mobility and upright sitting for bowel motility with pt continuing to refuse and stating "I can't go home like this". Will check back as schedule allows to continue with PT POC.  Lenora Boys. PTA Acute Rehabilitation Services Office: [phone]    Catalina Antigua 05/08/2023, 9:21 AM

## 2023-05-08 NOTE — Plan of Care (Signed)
Pt starting with D/C orders for today that was placed 10/22, but pt having issues with constipation even after enema given on nightshift, pt feeling constipated and impacted, RN notified Jesse [place] PA , and spoke with [facility], message relayed to Dr.Dumonski MD. MD came to see and assess pt, Discharge delayed, orders placed for enema and mag citrate.

## 2023-05-08 NOTE — Progress Notes (Addendum)
    Patient doing well  Describes constipation despite enema   Physical Exam: Vitals:   05/08/23 0723 05/08/23 0742  BP: (!) 134/56   Pulse: 85   Resp: 17   Temp: 98.6 F (37 C)   SpO2: 95% 94%    Dressing in place NVI  Pt s/p lumbar decompression and fusion doing well, with an impaction despite laxatives and an enema  - up with PT/OT, encourage ambulation - will order an abdominal xray, and write for additional laxitives (PO and PR) - may need a disimpaction

## 2023-05-08 NOTE — Progress Notes (Signed)
Patient stated earlier today that she was not comfortable going home until she had a bowel movement.  I did just check in with the patient.  I did also talk with her nurse.  She just recently had a large bowel movement.  I do feel that she is medically stable for discharge at this point in time.  The patient however states that she again is not comfortable going home, and that she wishes to go home tomorrow morning instead.  We will keep her here one additional evening, per her request.

## 2023-05-09 ENCOUNTER — Other Ambulatory Visit (HOSPITAL_COMMUNITY): Payer: Self-pay

## 2023-05-09 NOTE — Discharge Summary (Signed)
Patient ID: Rachel Crane MRN: [id number] DOB/AGE: 01/19/1946 77 y.o.  Admit date: 05/02/2023 Discharge date: 05/09/2023  Admission Diagnoses:  Principal Problem:   Radiculopathy, lumbar region   Discharge Diagnoses:  Same  Past Medical History:  Diagnosis Date   Abnormal stress ECG 10/06/2018   Aortic valve sclerosis 10/06/2018   Arthritis    Asthma    Bilateral carotid bruits 10/06/2018   Headache    H/O bad HA, since using CPAP- she has had improvement with that issue    Hypertension    Laboratory examination 10/06/2018   OSA on CPAP    settting - 8, uses CPAP q night    Pre-diabetes    Shingles     Surgeries: Procedure(s): LEFT-SIDED LUMBAR THREE- LUMBAR FOUR TRANSFORAMINAL LUMBAR INTERBODY FUSION AND DECOMPRESSION WITH INSTRUMENTATION AND ALLOGRAFT on 05/02/2023   Consultants: None  Discharged Condition: Improved  Hospital Course: Rachel Crane is an 77 y.o. female who was admitted 05/02/2023 for operative treatment of Radiculopathy, lumbar region. Patient has severe unremitting pain that affects sleep, daily activities, and work/hobbies. After pre-op clearance the patient was taken to the operating room on 05/02/2023 and underwent  Procedure(s): LEFT-SIDED LUMBAR THREE- LUMBAR FOUR TRANSFORAMINAL LUMBAR INTERBODY FUSION AND DECOMPRESSION WITH INSTRUMENTATION AND ALLOGRAFT.    Patient was given perioperative antibiotics:  Anti-infectives (From admission, onward)    Start     Dose/Rate Route Frequency Ordered Stop   05/03/23 0000  ceFAZolin (ANCEF) IVPB 2g/100 mL premix        2 g 200 mL/hr over 30 Minutes Intravenous Every 8 hours 05/02/23 2159 05/03/23 0634   05/02/23 1115  ceFAZolin (ANCEF) IVPB 2g/100 mL premix        2 g 200 mL/hr over 30 Minutes Intravenous On call to O.R. 05/02/23 1108 05/02/23 1734   05/02/23 1109  ceFAZolin (ANCEF) 2-4 GM/100ML-% IVPB       Note to Pharmacy: Darrick Huntsman: cabinet override      05/02/23 1109 05/02/23 1752         Patient was given sequential compression devices, early ambulation to prevent DVT.  Patient benefited maximally from hospital stay and there were no complications.    Recent vital signs: Patient Vitals for the past 24 hrs:  BP Temp Temp src Pulse Resp SpO2 Weight  05/09/23 0746 118/60 98.1 F (36.7 C) Oral 77 18 97 % --  05/09/23 0554 (!) 102/44 98.6 F (37 C) Oral 73 18 98 % 85.4 kg  05/08/23 1939 (!) 141/59 98.1 F (36.7 C) Oral 85 18 100 % --  05/08/23 1615 (!) 117/43 98.2 F (36.8 C) Oral 95 18 100 % --     Discharge Medications:   Allergies as of 05/09/2023   No Active Allergies      Medication List     TAKE these medications    acetic acid-hydrocortisone OTIC solution Commonly known as: VOSOL-HC Place 3 drops into the left ear 2 (two) times daily.   albuterol 108 (90 Base) MCG/ACT inhaler Commonly known as: Ventolin HFA Inhale 2 puffs into the lungs every 4 (four) hours as needed for wheezing or shortness of breath (coughing fits).   Allegra Allergy 180 MG tablet Generic drug: fexofenadine Take 180 mg by mouth daily as needed for allergies.   budesonide-formoterol 80-4.5 MCG/ACT inhaler Commonly known as: Symbicort Inhale 2 puffs into the lungs in the morning and at bedtime. What changed:  when to take this reasons to take this   carboxymethylcellulose 0.5 %  Soln Commonly known as: REFRESH PLUS Place 1 drop into both eyes 3 (three) times daily as needed (dry eyes).   diazepam 5 MG tablet Commonly known as: VALIUM Take 1 tablet (5 mg total) by mouth every 6 (six) hours as needed for muscle spasms.   empagliflozin 25 MG Tabs tablet Commonly known as: JARDIANCE Take 25 mg by mouth daily.   EpiCeram lotion Apply twice a day to the body as a moisturizer   fluticasone 50 MCG/ACT nasal spray Commonly known as: FLONASE Place 1 spray into both nostrils daily as needed (nasal congestion).   gabapentin 300 MG capsule Commonly known as:  NEURONTIN Take 2 capsules (600 mg total) by mouth 3 (three) times daily. What changed: how much to take   latanoprost 0.005 % ophthalmic solution Commonly known as: XALATAN Place 1 drop into both eyes at bedtime.   loratadine 10 MG tablet Commonly known as: Claritin Take 1 tablet (10 mg total) by mouth daily. What changed:  when to take this reasons to take this   losartan 100 MG tablet Commonly known as: COZAAR Take 1 tablet (100 mg total) by mouth daily at 10 pm.   methocarbamol 500 MG tablet Commonly known as: ROBAXIN Take 500 mg by mouth every 6 (six) hours as needed for muscle spasms.   multivitamin tablet Take 1 tablet by mouth daily.   ondansetron 4 MG tablet Commonly known as: ZOFRAN Take 1 tablet (4 mg total) by mouth every 6 (six) hours as needed for nausea or vomiting.   oxyCODONE-acetaminophen 5-325 MG tablet Commonly known as: PERCOCET/ROXICET Take 1-2 tablets by mouth every 6 (six) hours as needed for severe pain (pain score 7-10).   OxyCONTIN 10 MG 12 hr tablet Generic drug: oxyCODONE Take 1 tablet (10 mg total) by mouth every 12 (twelve) hours for 5 days.   Potassium Chloride ER 20 MEQ Tbcr Take 1 tablet by mouth daily.   predniSONE 20 MG tablet Commonly known as: DELTASONE 3 tabs daily x3 days, then 2 tabs daily x3 days, then 1 tab daily x3 days, then one half tab daily x3 days, then stop   promethazine-dextromethorphan 6.25-15 MG/5ML syrup Commonly known as: PROMETHAZINE-DM Take 5 mLs by mouth 4 (four) times daily as needed for cough.   rosuvastatin 20 MG tablet Commonly known as: CRESTOR Take 1 tablet (20 mg total) by mouth at bedtime.   traMADol 50 MG tablet Commonly known as: ULTRAM Take 1 tablet (50 mg total) by mouth every 6 (six) hours as needed for moderate pain (pain score 4-6).   triamcinolone ointment 0.1 % Commonly known as: KENALOG Apply 1 Application topically 2 (two) times daily as needed.   vitamin C 100 MG tablet Take 100  mg by mouth daily.               Durable Medical Equipment  (From admission, onward)           Start     Ordered   05/09/23 1131  For home use only DME Bedside commode  Once       Comments: Patient needs 3:1 to be provided.  Question:  Patient needs a bedside commode to treat with the following condition  Answer:  S/P lumbar fusion   05/09/23 1131   05/09/23 1130  For home use only DME Walker rolling  Once       Question Answer Comment  Walker: With 5 Inch Wheels   Patient needs a walker to treat with the following  condition S/P lumbar fusion      05/09/23 1131            Diagnostic Studies: DG Abd 1 View  Result Date: 05/08/2023 CLINICAL DATA:  Constipation EXAM: ABDOMEN - 1 VIEW COMPARISON:  None Available. FINDINGS: Nonobstructive pattern of bowel gas with gas and stool present to the rectum. Scattered stool throughout the left and right colon without large burden. No free air on supine radiographs. Surgical clips in the right upper quadrant. No acute osseous findings. IMPRESSION: Nonobstructive pattern of bowel gas with gas and stool present to the rectum. Scattered stool throughout the left and right colon without large burden. Electronically Signed   By: Jearld Lesch M.D.   On: 05/08/2023 16:50   DG Lumbar Spine 2-3 Views  Result Date: 05/02/2023 CLINICAL DATA:  Elective surgery EXAM: LUMBAR SPINE - 2-3 VIEW COMPARISON:  05/02/2023 FINDINGS: Two low resolution intraoperative spot views of the lumbar spine. Total fluoroscopy time was 40.7 seconds, fluoroscopic dose of 28.4 mGy. The images demonstrate posterior fusion hardware and interbody device at what appears to be L3-L4 level. Curvilinear densities projecting over the left hardware on the frontal view. IMPRESSION: Intraoperative fluoroscopic assistance provided during lumbar spine surgery. Electronically Signed   By: Jasmine Pang M.D.   On: 05/02/2023 22:29   DG Lumbar Spine 1 View  Result Date:  05/02/2023 CLINICAL DATA:  Left-sided lumbar transforaminal interbody fusion with decompression an allograft. EXAM: LUMBAR SPINE - 1 VIEW COMPARISON:  10/03/2022. FINDINGS: A single cross-table lateral image was obtained. There is no evidence of acute fracture. There is mild anterolisthesis at L3-L4 and L4-L5. Intervertebral disc space narrowing with degenerative endplate changes is present at L4-L5. Facet arthropathy is noted throughout the lumbar spine. Instrumentation is noted posterior to the L2 and L3 spinous processes. Surgical clips are present in the upper abdomen. IMPRESSION: Multilevel degenerative changes in the lumbar spine. Electronically Signed   By: Thornell Sartorius M.D.   On: 05/02/2023 22:26   DG C-Arm 1-60 Min-No Report  Result Date: 05/02/2023 Fluoroscopy was utilized by the requesting physician.  No radiographic interpretation.   DG C-Arm 1-60 Min-No Report  Result Date: 05/02/2023 Fluoroscopy was utilized by the requesting physician.  No radiographic interpretation.   DG C-Arm 1-60 Min-No Report  Result Date: 05/02/2023 Fluoroscopy was utilized by the requesting physician.  No radiographic interpretation.    Disposition: Discharge disposition: 01-Home or Self Care       PO 1 week S/P Lumbar fusion, D/C delay from px and constipation -Scripts for pain sent to pharmacy electronically  -D/C instructions sheet printed and in chart -D/C today  -F/U in office 2 weeks   Signed: Eilene Ghazi Oiva Dibari 05/09/2023, 4:06 PM

## 2023-05-09 NOTE — Progress Notes (Signed)
Patient feels much more comfortable today.  Her soreness has resolved.  Her leg pain has been resolved since surgery.  She is looking forward to going home.  She will go ahead and get discharged home, with follow-up in 1 week.  She will be handed discharge instructions and follow-up instructions upon discharge.

## 2023-05-09 NOTE — TOC Progression Note (Signed)
Transition of Care [facility]) - Progression Note    Patient Details  Name: MAILEY BETRO MRN: [id number] Date of Birth: 1945-11-22  [facility] CM/SW Contact  Janae Bridgeman, RN Phone Number: 05/09/2023, 11:40 AM  Clinical Narrative:    PT evaluated the patient this morning and requested Rw and 3:1 for the patient.  DME ordered entered and I asked the attending MD to co-sign the DME order and DME note.  Medicare choice regarding DME was offered to the patient and she did not have a preference.  I called [employer] to deliver the items to the hospital room prior to discharge today.  Patient is aware and bedside nursing updated.   Expected Discharge Plan: Home w Home Health Services Barriers to Discharge: Continued Medical Work up  Expected Discharge Plan and Services   Discharge Planning Services: CM Consult Post Acute Care Choice: Home Health Living arrangements for the past 2 months: Single Family Home Expected Discharge Date: 05/09/23                         HH Arranged: PT, OT HH Agency: [facility] Date HH Agency Contacted: 05/03/23 Time HH Agency Contacted: 1600 Representative spoke with at [facility] Agency: [facility] HH accepted for PT/OT   Social Determinants of Health (SDOH) Interventions SDOH Screenings   Food Insecurity: No Food Insecurity (05/03/2023)  Housing: Low Risk  (05/03/2023)  Transportation Needs: No Transportation Needs (05/03/2023)  Utilities: Not At Risk (05/03/2023)  Depression (PHQ2-9): Medium Risk (01/22/2019)  Tobacco Use: Low Risk  (05/03/2023)    Readmission Risk Interventions     No data to display

## 2023-05-09 NOTE — Progress Notes (Signed)
Physical Therapy Treatment Patient Details Name: Rachel Crane MRN: [id number] DOB: 1946/02/15 Today's Date: 05/09/2023   History of Present Illness HEARTLEY MALINA is a 77 yo female who underwent L3-4 TLIF 10/17. PMHx: Lt TKA 06/06/20, Rt TKA 09/19/17, HTN, Aortic valve sclerosis, asthma, bilateral carotid bruits, HA, CPAP, shingles, pre-diabetes    PT Comments  Pt up in room on arrival without brace donned, bending to unplug charger from wall. Pt educated on importance of brace wear when OOB and all spinal precautions and importance of compliance. Pt donning brace in sitting with min A and demonstrating transfers and gait without AD support with supervision to mod I. Continued education on precautions, brace application/wearing schedule, appropriate activity progression, and safe car transfer, with pt verbalizing understanding. Pt continues to benefit from skilled PT services to progress toward functional mobility goals.      If plan is discharge home, recommend the following: A little help with walking and/or transfers;Assistance with cooking/housework;Assist for transportation   Can travel by private vehicle        Equipment Recommendations  Rolling walker (2 wheels);BSC/3in1    Recommendations for Other Services       Precautions / Restrictions Precautions Precautions: Fall;Back Precaution Booklet Issued: Yes (comment) Precaution Comments: pt able to state 3/3 precautions with education throughout to maintain Required Braces or Orthoses: Spinal Brace Spinal Brace: Thoracolumbosacral orthotic;Applied in sitting position Restrictions Weight Bearing Restrictions: No     Mobility  Bed Mobility Overal bed mobility: Needs Assistance             General bed mobility comments: pt up OOB on arrival    Transfers Overall transfer level: Modified independent Equipment used: None Transfers: Sit to/from Stand Sit to Stand: Supervision           General transfer comment: pt  able to stand without assist or cues    Ambulation/Gait Ambulation/Gait assistance: Modified independent (Device/Increase time) Gait Distance (Feet): 220 Feet Assistive device: None Gait Pattern/deviations: Step-through pattern, Decreased stride length Gait velocity: decr     General Gait Details: pt with good posture, slow speed, self directing distance   Stairs             Wheelchair Mobility     Tilt Bed    Modified Rankin (Stroke Patients Only)       Balance Overall balance assessment: Mild deficits observed, not formally tested                                          Cognition Arousal: Alert Behavior During Therapy: WFL for tasks assessed/performed Overall Cognitive Status: Impaired/Different from baseline Area of Impairment: Safety/judgement                         Safety/Judgement: Decreased awareness of safety, Decreased awareness of deficits   Problem Solving: Slow processing, Requires verbal cues General Comments: can be self limiting at times, needs encouragement to particiapte, decreased awarness of importance of brace wear and following of spinal precautions        Exercises      General Comments General comments (skin integrity, edema, etc.): VSS on RA      Pertinent Vitals/Pain Pain Assessment Pain Assessment: Faces Faces Pain Scale: Hurts a little bit Pain Location: back Pain Descriptors / Indicators: Aching, Guarding Pain Intervention(s): Limited activity within patient's tolerance, Monitored during  session    Home Living                          Prior Function            PT Goals (current goals can now be found in the care plan section) Acute Rehab PT Goals PT Goal Formulation: With patient Time For Goal Achievement: 05/10/23 Progress towards PT goals: Progressing toward goals    Frequency    Min 5X/week      PT Plan      Co-evaluation              AM-PAC PT "6  Clicks" Mobility   Outcome Measure  Help needed turning from your back to your side while in a flat bed without using bedrails?: A Little Help needed moving from lying on your back to sitting on the side of a flat bed without using bedrails?: A Little Help needed moving to and from a bed to a chair (including a wheelchair)?: None Help needed standing up from a chair using your arms (e.g., wheelchair or bedside chair)?: None Help needed to walk in hospital room?: None Help needed climbing 3-5 steps with a railing? : A Little 6 Click Score: 21    End of Session Equipment Utilized During Treatment: Back brace Activity Tolerance: Patient tolerated treatment well Patient left: Other (comment) (up in room) Nurse Communication: Mobility status PT Visit Diagnosis: Other abnormalities of gait and mobility (R26.89) Pain - part of body:  (low back)     Time: 0865-7846 PT Time Calculation (min) (ACUTE ONLY): 11 min  Charges:    $Therapeutic Activity: 8-22 mins PT General Charges $$ ACUTE PT VISIT: 1 Visit                     Refoel Palladino R. PTA Acute Rehabilitation Services Office: [phone]   Catalina Antigua 05/09/2023, 10:13 AM

## 2023-05-09 NOTE — Progress Notes (Signed)
    Durable Medical Equipment  (From admission, onward)           Start     Ordered   05/09/23 1131  For home use only DME Bedside commode  Once       Comments: Patient needs 3:1 to be provided.  Question:  Patient needs a bedside commode to treat with the following condition  Answer:  S/P lumbar fusion   05/09/23 1131   05/09/23 1130  For home use only DME Walker rolling  Once       Question Answer Comment  Walker: With 5 Inch Wheels   Patient needs a walker to treat with the following condition S/P lumbar fusion      05/09/23 1131

## 2023-05-10 DIAGNOSIS — G4733 Obstructive sleep apnea (adult) (pediatric): Secondary | ICD-10-CM | POA: Diagnosis not present

## 2023-05-10 DIAGNOSIS — G8929 Other chronic pain: Secondary | ICD-10-CM | POA: Diagnosis not present

## 2023-05-10 DIAGNOSIS — I10 Essential (primary) hypertension: Secondary | ICD-10-CM | POA: Diagnosis not present

## 2023-05-10 DIAGNOSIS — I350 Nonrheumatic aortic (valve) stenosis: Secondary | ICD-10-CM | POA: Diagnosis not present

## 2023-05-10 DIAGNOSIS — M48061 Spinal stenosis, lumbar region without neurogenic claudication: Secondary | ICD-10-CM | POA: Diagnosis not present

## 2023-05-10 DIAGNOSIS — Z96652 Presence of left artificial knee joint: Secondary | ICD-10-CM | POA: Diagnosis not present

## 2023-05-10 DIAGNOSIS — Z471 Aftercare following joint replacement surgery: Secondary | ICD-10-CM | POA: Diagnosis not present

## 2023-05-10 DIAGNOSIS — M4804 Spinal stenosis, thoracic region: Secondary | ICD-10-CM | POA: Diagnosis not present

## 2023-05-10 DIAGNOSIS — J4540 Moderate persistent asthma, uncomplicated: Secondary | ICD-10-CM | POA: Diagnosis not present

## 2023-05-10 DIAGNOSIS — M5416 Radiculopathy, lumbar region: Secondary | ICD-10-CM | POA: Diagnosis not present

## 2023-05-10 DIAGNOSIS — Z981 Arthrodesis status: Secondary | ICD-10-CM | POA: Diagnosis not present

## 2023-05-24 ENCOUNTER — Other Ambulatory Visit: Payer: Self-pay | Admitting: Internal Medicine

## 2023-05-24 DIAGNOSIS — Z1231 Encounter for screening mammogram for malignant neoplasm of breast: Secondary | ICD-10-CM

## 2023-05-27 DIAGNOSIS — N1831 Chronic kidney disease, stage 3a: Secondary | ICD-10-CM | POA: Diagnosis not present

## 2023-05-27 DIAGNOSIS — E7800 Pure hypercholesterolemia, unspecified: Secondary | ICD-10-CM | POA: Diagnosis not present

## 2023-05-27 DIAGNOSIS — M1712 Unilateral primary osteoarthritis, left knee: Secondary | ICD-10-CM | POA: Diagnosis not present

## 2023-05-27 DIAGNOSIS — G4733 Obstructive sleep apnea (adult) (pediatric): Secondary | ICD-10-CM | POA: Diagnosis not present

## 2023-05-27 DIAGNOSIS — Z23 Encounter for immunization: Secondary | ICD-10-CM | POA: Diagnosis not present

## 2023-05-27 DIAGNOSIS — E1169 Type 2 diabetes mellitus with other specified complication: Secondary | ICD-10-CM | POA: Diagnosis not present

## 2023-06-10 DIAGNOSIS — M5416 Radiculopathy, lumbar region: Secondary | ICD-10-CM | POA: Diagnosis not present

## 2023-06-18 ENCOUNTER — Other Ambulatory Visit (HOSPITAL_COMMUNITY): Payer: Self-pay

## 2023-06-18 ENCOUNTER — Ambulatory Visit
Admission: RE | Admit: 2023-06-18 | Discharge: 2023-06-18 | Disposition: A | Discharge status: Home / Self Care | Payer: Medicare Other | Source: Ambulatory Visit | Attending: Internal Medicine | Admitting: Internal Medicine

## 2023-06-18 DIAGNOSIS — Z1231 Encounter for screening mammogram for malignant neoplasm of breast: Secondary | ICD-10-CM | POA: Diagnosis not present

## 2023-07-22 DIAGNOSIS — M545 Low back pain, unspecified: Secondary | ICD-10-CM | POA: Diagnosis not present

## 2023-07-24 DIAGNOSIS — M6281 Muscle weakness (generalized): Secondary | ICD-10-CM | POA: Diagnosis not present

## 2023-07-24 DIAGNOSIS — R262 Difficulty in walking, not elsewhere classified: Secondary | ICD-10-CM | POA: Diagnosis not present

## 2023-07-24 DIAGNOSIS — M4326 Fusion of spine, lumbar region: Secondary | ICD-10-CM | POA: Diagnosis not present

## 2023-07-25 DIAGNOSIS — E7800 Pure hypercholesterolemia, unspecified: Secondary | ICD-10-CM | POA: Diagnosis not present

## 2023-07-30 DIAGNOSIS — M6281 Muscle weakness (generalized): Secondary | ICD-10-CM | POA: Diagnosis not present

## 2023-07-30 DIAGNOSIS — M4326 Fusion of spine, lumbar region: Secondary | ICD-10-CM | POA: Diagnosis not present

## 2023-07-30 DIAGNOSIS — R262 Difficulty in walking, not elsewhere classified: Secondary | ICD-10-CM | POA: Diagnosis not present

## 2023-08-01 DIAGNOSIS — M6281 Muscle weakness (generalized): Secondary | ICD-10-CM | POA: Diagnosis not present

## 2023-08-01 DIAGNOSIS — M4326 Fusion of spine, lumbar region: Secondary | ICD-10-CM | POA: Diagnosis not present

## 2023-08-01 DIAGNOSIS — R262 Difficulty in walking, not elsewhere classified: Secondary | ICD-10-CM | POA: Diagnosis not present

## 2023-08-06 DIAGNOSIS — M6281 Muscle weakness (generalized): Secondary | ICD-10-CM | POA: Diagnosis not present

## 2023-08-06 DIAGNOSIS — M4326 Fusion of spine, lumbar region: Secondary | ICD-10-CM | POA: Diagnosis not present

## 2023-08-06 DIAGNOSIS — R262 Difficulty in walking, not elsewhere classified: Secondary | ICD-10-CM | POA: Diagnosis not present

## 2023-08-12 DIAGNOSIS — M4326 Fusion of spine, lumbar region: Secondary | ICD-10-CM | POA: Diagnosis not present

## 2023-08-12 DIAGNOSIS — H401131 Primary open-angle glaucoma, bilateral, mild stage: Secondary | ICD-10-CM | POA: Diagnosis not present

## 2023-08-12 DIAGNOSIS — M6281 Muscle weakness (generalized): Secondary | ICD-10-CM | POA: Diagnosis not present

## 2023-08-12 DIAGNOSIS — M545 Low back pain, unspecified: Secondary | ICD-10-CM | POA: Diagnosis not present

## 2023-08-12 DIAGNOSIS — R262 Difficulty in walking, not elsewhere classified: Secondary | ICD-10-CM | POA: Diagnosis not present

## 2023-08-27 DIAGNOSIS — E7800 Pure hypercholesterolemia, unspecified: Secondary | ICD-10-CM | POA: Diagnosis not present

## 2023-08-27 DIAGNOSIS — I10 Essential (primary) hypertension: Secondary | ICD-10-CM | POA: Diagnosis not present

## 2023-08-27 DIAGNOSIS — J309 Allergic rhinitis, unspecified: Secondary | ICD-10-CM | POA: Diagnosis not present

## 2023-08-27 DIAGNOSIS — E1169 Type 2 diabetes mellitus with other specified complication: Secondary | ICD-10-CM | POA: Diagnosis not present

## 2023-08-27 DIAGNOSIS — R42 Dizziness and giddiness: Secondary | ICD-10-CM | POA: Diagnosis not present

## 2023-08-27 DIAGNOSIS — G894 Chronic pain syndrome: Secondary | ICD-10-CM | POA: Diagnosis not present

## 2023-09-10 DIAGNOSIS — M6281 Muscle weakness (generalized): Secondary | ICD-10-CM | POA: Diagnosis not present

## 2023-09-10 DIAGNOSIS — M4326 Fusion of spine, lumbar region: Secondary | ICD-10-CM | POA: Diagnosis not present

## 2023-09-10 DIAGNOSIS — R262 Difficulty in walking, not elsewhere classified: Secondary | ICD-10-CM | POA: Diagnosis not present

## 2023-09-12 DIAGNOSIS — R262 Difficulty in walking, not elsewhere classified: Secondary | ICD-10-CM | POA: Diagnosis not present

## 2023-09-12 DIAGNOSIS — M6281 Muscle weakness (generalized): Secondary | ICD-10-CM | POA: Diagnosis not present

## 2023-09-12 DIAGNOSIS — M4326 Fusion of spine, lumbar region: Secondary | ICD-10-CM | POA: Diagnosis not present

## 2023-09-17 DIAGNOSIS — M6281 Muscle weakness (generalized): Secondary | ICD-10-CM | POA: Diagnosis not present

## 2023-09-17 DIAGNOSIS — M4326 Fusion of spine, lumbar region: Secondary | ICD-10-CM | POA: Diagnosis not present

## 2023-09-17 DIAGNOSIS — R262 Difficulty in walking, not elsewhere classified: Secondary | ICD-10-CM | POA: Diagnosis not present

## 2023-09-18 ENCOUNTER — Other Ambulatory Visit: Payer: No Typology Code available for payment source

## 2023-09-18 ENCOUNTER — Ambulatory Visit (HOSPITAL_COMMUNITY): Payer: Medicare Other | Attending: Cardiology

## 2023-09-18 DIAGNOSIS — I10 Essential (primary) hypertension: Secondary | ICD-10-CM | POA: Insufficient documentation

## 2023-09-18 LAB — ECHOCARDIOGRAM COMPLETE
Area-P 1/2: 3.06 cm2
S' Lateral: 2.6 cm

## 2023-09-20 ENCOUNTER — Telehealth: Payer: Self-pay | Admitting: Cardiology

## 2023-09-20 NOTE — Telephone Encounter (Signed)
 Spoke with pt and explained that this appt with Dr. Odis Hollingshead on 3/14 is her annual visit. Pt verbalized understanding and stated she will keep that appt.

## 2023-09-20 NOTE — Telephone Encounter (Signed)
 Rachel aMessage:     Patient wants to know if she still need the appointment on 09-27-23 with Dr Odis Hollingshead?

## 2023-09-22 ENCOUNTER — Encounter: Payer: Self-pay | Admitting: Cardiology

## 2023-09-23 DIAGNOSIS — M545 Low back pain, unspecified: Secondary | ICD-10-CM | POA: Diagnosis not present

## 2023-09-25 ENCOUNTER — Emergency Department (HOSPITAL_COMMUNITY)

## 2023-09-25 ENCOUNTER — Other Ambulatory Visit: Payer: Self-pay

## 2023-09-25 ENCOUNTER — Emergency Department (HOSPITAL_COMMUNITY)
Admission: EM | Admit: 2023-09-25 | Discharge: 2023-09-25 | Disposition: A | Discharge status: Home / Self Care | Attending: Emergency Medicine | Admitting: Emergency Medicine

## 2023-09-25 DIAGNOSIS — R42 Dizziness and giddiness: Secondary | ICD-10-CM | POA: Insufficient documentation

## 2023-09-25 DIAGNOSIS — J45909 Unspecified asthma, uncomplicated: Secondary | ICD-10-CM | POA: Diagnosis not present

## 2023-09-25 DIAGNOSIS — N3000 Acute cystitis without hematuria: Secondary | ICD-10-CM | POA: Diagnosis not present

## 2023-09-25 DIAGNOSIS — I10 Essential (primary) hypertension: Secondary | ICD-10-CM | POA: Diagnosis not present

## 2023-09-25 DIAGNOSIS — R55 Syncope and collapse: Secondary | ICD-10-CM | POA: Diagnosis not present

## 2023-09-25 DIAGNOSIS — R29818 Other symptoms and signs involving the nervous system: Secondary | ICD-10-CM | POA: Diagnosis not present

## 2023-09-25 DIAGNOSIS — I6782 Cerebral ischemia: Secondary | ICD-10-CM | POA: Diagnosis not present

## 2023-09-25 DIAGNOSIS — Z79899 Other long term (current) drug therapy: Secondary | ICD-10-CM | POA: Insufficient documentation

## 2023-09-25 LAB — COMPREHENSIVE METABOLIC PANEL
ALT: 21 U/L (ref 0–44)
AST: 22 U/L (ref 15–41)
Albumin: 3.8 g/dL (ref 3.5–5.0)
Alkaline Phosphatase: 75 U/L (ref 38–126)
Anion gap: 8 (ref 5–15)
BUN: 10 mg/dL (ref 8–23)
CO2: 25 mmol/L (ref 22–32)
Calcium: 9.6 mg/dL (ref 8.9–10.3)
Chloride: 104 mmol/L (ref 98–111)
Creatinine, Ser: 0.92 mg/dL (ref 0.44–1.00)
GFR, Estimated: >60 mL/min (ref >60)
Glucose, Bld: 141 mg/dL — ABNORMAL HIGH (ref 70–99)
Potassium: 3.8 mmol/L (ref 3.5–5.1)
Sodium: 137 mmol/L (ref 135–145)
Total Bilirubin: 0.4 mg/dL (ref 0.0–1.2)
Total Protein: 7.1 g/dL (ref 6.5–8.1)

## 2023-09-25 LAB — URINALYSIS, W/ REFLEX TO CULTURE (INFECTION SUSPECTED)
Bilirubin Urine: NEGATIVE
Glucose, UA: NEGATIVE mg/dL
Ketones, ur: NEGATIVE mg/dL
Nitrite: NEGATIVE
Protein, ur: NEGATIVE mg/dL
Specific Gravity, Urine: 1.003 — ABNORMAL LOW (ref 1.005–1.030)
pH: 7 (ref 5.0–8.0)

## 2023-09-25 LAB — CBC WITH DIFFERENTIAL/PLATELET
Abs Immature Granulocytes: 0.02 10*3/uL (ref 0.00–0.07)
Basophils Absolute: 0 10*3/uL (ref 0.0–0.1)
Basophils Relative: 1 %
Eosinophils Absolute: 0 10*3/uL (ref 0.0–0.5)
Eosinophils Relative: 1 %
HCT: 38.4 % (ref 36.0–46.0)
Hemoglobin: 12.4 g/dL (ref 12.0–15.0)
Immature Granulocytes: 0 %
Lymphocytes Relative: 43 %
Lymphs Abs: 2.8 10*3/uL (ref 0.7–4.0)
MCH: 26.2 pg (ref 26.0–34.0)
MCHC: 32.3 g/dL (ref 30.0–36.0)
MCV: 81 fL (ref 80.0–100.0)
Monocytes Absolute: 0.4 10*3/uL (ref 0.1–1.0)
Monocytes Relative: 6 %
Neutro Abs: 3.2 10*3/uL (ref 1.7–7.7)
Neutrophils Relative %: 49 %
Platelets: 182 10*3/uL (ref 150–400)
RBC: 4.74 MIL/uL (ref 3.87–5.11)
RDW: 15.2 % (ref 11.5–15.5)
WBC: 6.5 10*3/uL (ref 4.0–10.5)
nRBC: 0 % (ref 0.0–0.2)

## 2023-09-25 LAB — CBG MONITORING, ED: Glucose-Capillary: 131 mg/dL — ABNORMAL HIGH (ref 70–99)

## 2023-09-25 MED ORDER — MECLIZINE HCL 25 MG PO TABS: 25.0000 mg | ORAL_TABLET | Freq: Three times a day (TID) | ORAL | 0 refills | Status: DC | PRN

## 2023-09-25 MED ORDER — MECLIZINE HCL 25 MG PO TABS
25.0000 mg | ORAL_TABLET | Freq: Once | ORAL | Status: AC
  Administered 2023-09-25: 25 mg via ORAL
  Filled 2023-09-25: qty 1

## 2023-09-25 MED ORDER — CEPHALEXIN 250 MG PO CAPS: 250.0000 mg | ORAL_CAPSULE | Freq: Three times a day (TID) | ORAL | 0 refills | Status: DC

## 2023-09-25 MED ORDER — LORAZEPAM 1 MG PO TABS
1.0000 mg | ORAL_TABLET | Freq: Once | ORAL | Status: AC
  Administered 2023-09-25: 1 mg via ORAL
  Filled 2023-09-25: qty 1

## 2023-09-25 NOTE — ED Triage Notes (Signed)
 Patient with dizziness x 2 weeks. Also complains of shoulder pain. Reports episodes last 2-3 mins.

## 2023-09-25 NOTE — ED Provider Notes (Signed)
 [place] EMERGENCY DEPARTMENT AT [facility] Provider Note   CSN: [id number] Arrival date & time: 09/25/23  1340     History  Chief Complaint  Patient presents with   Dizziness    Rachel Crane is a 78 y.o. female.   Dizziness    Patient has a history of hypertension arthritis headaches asthma carotid bruits who presents to the ED for evaluation of dizziness.  Patient states it has been ongoing for the last couple of weeks.  It occurs 2 to 3 minutes at a time.  Patient states initially is not very severe but today it was more severe.  She felt the room spinning.  Patient states she has not had any chest pain fever visual changes no focal weakness.  Home Medications Prior to Admission medications   Medication Sig Start Date End Date Taking? Authorizing Provider  cephALEXin (KEFLEX) 250 MG capsule Take 1 capsule (250 mg total) by mouth 3 (three) times daily for 7 days. 09/25/23 10/02/23 Yes Linwood Dibbles, MD  meclizine (ANTIVERT) 25 MG tablet Take 1 tablet (25 mg total) by mouth 3 (three) times daily as needed for dizziness. 09/25/23  Yes Linwood Dibbles, MD  acetic acid-hydrocortisone (VOSOL-HC) OTIC solution Place 3 drops into the left ear 2 (two) times daily. Patient not taking: Reported on 04/17/2023 11/29/22   Guy Sandifer L, PA  albuterol (VENTOLIN HFA) 108 (90 Base) MCG/ACT inhaler Inhale 2 puffs into the lungs every 4 (four) hours as needed for wheezing or shortness of breath (coughing fits). 02/08/23   Ellamae Sia, DO  Ascorbic Acid (VITAMIN C) 100 MG tablet Take 100 mg by mouth daily.    [provider]  budesonide-formoterol (SYMBICORT) 80-4.5 MCG/ACT inhaler Inhale 2 puffs into the lungs in the morning and at bedtime. Patient taking differently: Inhale 2 puffs into the lungs 2 (two) times daily as needed (shortness of breath). 01/18/23   Zenia Resides, MD  carboxymethylcellulose (REFRESH PLUS) 0.5 % SOLN Place 1 drop into both eyes 3 (three) times daily as  needed (dry eyes).    [provider]  Dermatological Products, Misc. [facility]) lotion Apply twice a day to the body as a moisturizer 05/16/22   Ellamae Sia, DO  diazepam (VALIUM) 5 MG tablet Take 1 tablet (5 mg total) by mouth every 6 (six) hours as needed for muscle spasms. 05/07/23   McKenzie, Eilene Ghazi, PA-C  empagliflozin (JARDIANCE) 25 MG TABS tablet Take 25 mg by mouth daily. 06/28/21   [provider]  fexofenadine (ALLEGRA ALLERGY) 180 MG tablet Take 180 mg by mouth daily as needed for allergies.    [provider]  fluticasone (FLONASE) 50 MCG/ACT nasal spray Place 1 spray into both nostrils daily as needed (nasal congestion). 11/29/22   Crain, Whitney L, PA  gabapentin (NEURONTIN) 300 MG capsule Take 2 capsules (600 mg total) by mouth 3 (three) times daily. Patient taking differently: Take 300 mg by mouth 3 (three) times daily. 11/05/22 05/02/23  London Sheer, MD  latanoprost (XALATAN) 0.005 % ophthalmic solution Place 1 drop into both eyes at bedtime. 12/31/22   [provider]  loratadine (CLARITIN) 10 MG tablet Take 1 tablet (10 mg total) by mouth daily. Patient taking differently: Take 10 mg by mouth daily as needed for allergies. 09/04/22   Ellamae Sia, DO  losartan (COZAAR) 100 MG tablet Take 1 tablet (100 mg total) by mouth daily at 10 pm. 09/18/22   Tessa Lerner, DO  methocarbamol (ROBAXIN) 500 MG tablet Take 500 mg by mouth every 6 (six) hours as needed for muscle spasms.    [provider]  Multiple Vitamin (MULTIVITAMIN) tablet Take 1 tablet by mouth daily.    [provider]  ondansetron (ZOFRAN) 4 MG tablet Take 1 tablet (4 mg total) by mouth every 6 (six) hours as needed for nausea or vomiting. 05/07/23   McKenzie, Eilene Ghazi, PA-C  oxyCODONE-acetaminophen (PERCOCET/ROXICET) 5-325 MG tablet Take 1-2 tablets by mouth every 6 (six) hours as needed for severe pain (pain score 7-10). 05/07/23   McKenzie, Eilene Ghazi, PA-C  Potassium  Chloride ER 20 MEQ TBCR Take 1 tablet by mouth daily. Patient not taking: Reported on 04/17/2023 08/09/20   [provider]  predniSONE (DELTASONE) 20 MG tablet 3 tabs daily x3 days, then 2 tabs daily x3 days, then 1 tab daily x3 days, then one half tab daily x3 days, then stop Patient not taking: Reported on 04/17/2023 01/18/23   Zenia Resides, MD  promethazine-dextromethorphan (PROMETHAZINE-DM) 6.25-15 MG/5ML syrup Take 5 mLs by mouth 4 (four) times daily as needed for cough. 01/18/23   Zenia Resides, MD  rosuvastatin (CRESTOR) 20 MG tablet Take 1 tablet (20 mg total) by mouth at bedtime. 04/16/23   Tolia, Sunit, DO  traMADol (ULTRAM) 50 MG tablet Take 1 tablet (50 mg total) by mouth every 6 (six) hours as needed for moderate pain (pain score 4-6). 05/03/23   Estill Bamberg, MD  triamcinolone ointment (KENALOG) 0.1 % Apply 1 Application topically 2 (two) times daily as needed. 02/08/23   Ellamae Sia, DO      Allergies    Patient has no known allergies.    Review of Systems   Review of Systems  Neurological:  Positive for dizziness.    Physical Exam Updated Vital Signs BP (!) 151/66   Pulse 62   Temp 98 F (36.7 C) (Oral)   Resp 19   Ht 1.676 m (5\' 6" )   Wt 93 kg   SpO2 99%   BMI 33.09 kg/m  Physical Exam Vitals and nursing note reviewed.  Constitutional:      General: She is not in acute distress.    Appearance: She is well-developed.  HENT:     Head: Normocephalic and atraumatic.     Right Ear: External ear normal.     Left Ear: External ear normal.  Eyes:     General: No visual field deficit or scleral icterus.       Right eye: No discharge.        Left eye: No discharge.     Conjunctiva/sclera: Conjunctivae normal.  Neck:     Trachea: No tracheal deviation.  Cardiovascular:     Rate and Rhythm: Normal rate and regular rhythm.  Pulmonary:     Effort: Pulmonary effort is normal. No respiratory distress.     Breath sounds: Normal breath sounds. No  stridor. No wheezing or rales.  Abdominal:     General: Bowel sounds are normal. There is no distension.     Palpations: Abdomen is soft.     Tenderness: There is no abdominal tenderness. There is no guarding or rebound.  Musculoskeletal:        General: No tenderness.     Cervical back: Neck supple.  Skin:    General: Skin is warm and dry.     Findings: No rash.  Neurological:     Mental Status: She is alert and oriented to  person, place, and time.     Cranial Nerves: No cranial nerve deficit, dysarthria or facial asymmetry.     Sensory: No sensory deficit.     Motor: No abnormal muscle tone, seizure activity or pronator drift.     Coordination: Coordination normal.     Comments:  able to hold both legs off bed for 5 seconds, sensation intact in all extremities,  no left or right sided neglect, normal finger-nose exam bilaterally, no nystagmus noted   Psychiatric:        Mood and Affect: Mood normal.     ED Results / Procedures / Treatments   Labs (all labs ordered are listed, but only abnormal results are displayed) Labs Reviewed  URINALYSIS, W/ REFLEX TO CULTURE (INFECTION SUSPECTED) - Abnormal; Notable for the following components:      Result Value   Color, Urine STRAW (*)    Specific Gravity, Urine 1.003 (*)    Hgb urine dipstick MODERATE (*)    Leukocytes,Ua LARGE (*)    Bacteria, UA FEW (*)    All other components within normal limits  COMPREHENSIVE METABOLIC PANEL - Abnormal; Notable for the following components:   Glucose, Bld 141 (*)    All other components within normal limits  CBG MONITORING, ED - Abnormal; Notable for the following components:   Glucose-Capillary 131 (*)    All other components within normal limits  URINE CULTURE  CBC WITH DIFFERENTIAL/PLATELET    EKG EKG Interpretation Date/Time:  Wednesday September 25 2023 14:01:59 EDT Ventricular Rate:  51 PR Interval:  204 QRS Duration:  80 QT Interval:  408 QTC Calculation: 376 R  Axis:   52  Text Interpretation: Sinus bradycardia Septal infarct , age undetermined Abnormal ECG When compared with ECG of 11-Mar-2023 17:22, Since last tracing rate slower Confirmed by Linwood Dibbles [phone]) on 09/25/2023 5:58:22 PM  Radiology MR BRAIN WO CONTRAST Result Date: 09/25/2023 CLINICAL DATA:  Acute neurologic deficit EXAM: MRI HEAD WITHOUT CONTRAST TECHNIQUE: Multiplanar, multiecho pulse sequences of the brain and surrounding structures were obtained without intravenous contrast. COMPARISON:  None Available. FINDINGS: Brain: No acute infarct, mass effect or extra-axial collection. No acute or chronic hemorrhage. There is multifocal hyperintense T2-weighted signal within the white matter. Parenchymal volume and CSF spaces are normal. A partially empty sella turcica is incidentally noted. Vascular: Normal flow voids. Skull and upper cervical spine: Normal calvarium and skull base. Visualized upper cervical spine and soft tissues are normal. Sinuses/Orbits:No paranasal sinus fluid levels or advanced mucosal thickening. No mastoid or middle ear effusion. Normal orbits. IMPRESSION: 1. No acute intracranial abnormality. 2. Findings of chronic small vessel ischemia. Electronically Signed   By: Deatra Robinson M.D.   On: 09/25/2023 22:57   CT Head Wo Contrast Result Date: 09/25/2023 CLINICAL DATA:  Provided history: Syncope/presyncope, cerebrovascular cause suspected. EXAM: CT HEAD WITHOUT CONTRAST TECHNIQUE: Contiguous axial images were obtained from the base of the skull through the vertex without intravenous contrast. RADIATION DOSE REDUCTION: This exam was performed according to the departmental dose-optimization program which includes automated exposure control, adjustment of the mA and/or kV according to patient size and/or use of iterative reconstruction technique. COMPARISON:  Head CT 01/28/2023. FINDINGS: Brain: There is no acute intracranial hemorrhage. No demarcated cortical infarct. No extra-axial  fluid collection. No evidence of an intracranial mass. No midline shift. Partially empty sella turcica. Vascular: No hyperdense vessel.  Atherosclerotic calcifications. Skull: No calvarial fracture or aggressive osseous lesion. Sinuses/Orbits: No mass or acute finding within the imaged  orbits. No significant paranasal sinus disease at the imaged levels. IMPRESSION: No evidence of an acute intracranial abnormality. Electronically Signed   By: Jackey Loge D.O.   On: 09/25/2023 16:59    Procedures Procedures    Medications Ordered in ED Medications  meclizine (ANTIVERT) tablet 25 mg (25 mg Oral Given 09/25/23 1426)  LORazepam (ATIVAN) tablet 1 mg (1 mg Oral Given 09/25/23 1828)  meclizine (ANTIVERT) tablet 25 mg (25 mg Oral Given 09/25/23 1824)    ED Course/ Medical Decision Making/ A&P Clinical Course as of 09/25/23 2319  Wed Sep 25, 2023  1757 CBC and metabolic panel unremarkable.  CT scan without acute findings [JK]  2300 Urinalysis, w/ Reflex to Culture (Infection Suspected) -Urine, Clean Catch(!) urinalysis does show 21-50 whites few bacteria [JK]  2301 MRI does not show any acute abnormality.  No signs of stroke [JK]    Clinical Course User Index [JK] Linwood Dibbles, MD                                 Medical Decision Making Problems Addressed: Dizziness: acute illness or injury that poses a threat to life or bodily functions Vertigo: acute illness or injury that poses a threat to life or bodily functions  Amount and/or Complexity of Data Reviewed Labs: ordered. Decision-making details documented in ED Course. Radiology: ordered and independent interpretation performed.  Risk Prescription drug management.   Patient presented to the ED for evaluation of dizziness.  Patient reported feeling off balance.  No focal numbness or weakness.  No visual disturbance.  Initial laboratory test did not show signs of anemia.  No significant electrolyte abnormalities to account for her  dizziness.  Initial head CT was negative but I was concerned about the possibility of occult stroke.  MRI was performed and it does not show any acute abnormality.  Patient's urinalysis suggests possible UTI.  Patient not having clear dysuria although some frequency.  Will treat with antibiotics.  Evaluation and diagnostic testing in the emergency department does not suggest an emergent condition requiring admission or immediate intervention beyond what has been performed at this time.  The patient is safe for discharge and has been instructed to return immediately for worsening symptoms, change in symptoms or any other concerns.       Final Clinical Impression(s) / ED Diagnoses Final diagnoses:  Dizziness  Vertigo  Acute cystitis without hematuria    Rx / DC Orders ED Discharge Orders          Ordered    meclizine (ANTIVERT) 25 MG tablet  3 times daily PRN        09/25/23 2313    cephALEXin (KEFLEX) 250 MG capsule  3 times daily        09/25/23 2318              Linwood Dibbles, MD 09/25/23 2319

## 2023-09-25 NOTE — ED Provider Triage Note (Signed)
 Emergency Medicine Provider Triage Evaluation Note  Rachel Crane , a 78 y.o. female  was evaluated in triage.  Pt complains of dizziness.  Dizziness daily x 2 weeks.  Patient reports that she has waves of dizziness that last several minutes at a time.  This resolves and then repeats.  She denies associated chest pain, fever, visual change, focal weakness.  Review of Systems  Positive: Dizziness Negative: Chest pain, fever  Physical Exam  BP (!) 145/66 (BP Location: Right Arm)   Pulse (!) 56   Temp 98 F (36.7 C) (Oral)   Resp 20   SpO2 100%  Gen:   Awake, no distress   Resp:  Normal effort  MSK:   Moves extremities without difficulty  Other:    Medical Decision Making  Medically screening exam initiated at 1:56 PM.  Appropriate orders placed.  ROMELL CAVANAH was informed that the remainder of the evaluation will be completed by another provider, this initial triage assessment does not replace that evaluation, and the importance of remaining in the ED until their evaluation is complete.  Patient aware of need to remain in the ED until completion of evaluation.   Wynetta Fines, MD 09/25/23 1359

## 2023-09-25 NOTE — ED Notes (Signed)
 Patient transported to MRI

## 2023-09-25 NOTE — Discharge Instructions (Addendum)
 Try stopping the methocarbamol medication as it could be contributing to the dizziness.  The meclizine can be used as needed for dizziness to see if that helps.  The MRI did not show any signs of stroke or brain tumor.  Follow up with your doctor to be rechecked.

## 2023-09-25 NOTE — ED Notes (Signed)
 Complete linen change. Assisted pt with bedside commode.

## 2023-09-26 ENCOUNTER — Ambulatory Visit: Payer: Self-pay | Admitting: Cardiology

## 2023-09-26 DIAGNOSIS — M4326 Fusion of spine, lumbar region: Secondary | ICD-10-CM | POA: Diagnosis not present

## 2023-09-26 DIAGNOSIS — R262 Difficulty in walking, not elsewhere classified: Secondary | ICD-10-CM | POA: Diagnosis not present

## 2023-09-26 DIAGNOSIS — M6281 Muscle weakness (generalized): Secondary | ICD-10-CM | POA: Diagnosis not present

## 2023-09-27 ENCOUNTER — Ambulatory Visit: Payer: Medicare Other | Attending: Cardiology | Admitting: Cardiology

## 2023-09-27 ENCOUNTER — Encounter: Payer: Self-pay | Admitting: Cardiology

## 2023-09-27 VITALS — BP 130/84 | HR 71 | Resp 14 | Ht 66.0 in | Wt 211.4 lb

## 2023-09-27 DIAGNOSIS — I10 Essential (primary) hypertension: Secondary | ICD-10-CM | POA: Diagnosis not present

## 2023-09-27 DIAGNOSIS — Z6834 Body mass index (BMI) 34.0-34.9, adult: Secondary | ICD-10-CM | POA: Diagnosis not present

## 2023-09-27 DIAGNOSIS — E6609 Other obesity due to excess calories: Secondary | ICD-10-CM | POA: Diagnosis not present

## 2023-09-27 DIAGNOSIS — E119 Type 2 diabetes mellitus without complications: Secondary | ICD-10-CM | POA: Diagnosis not present

## 2023-09-27 DIAGNOSIS — E66811 Obesity, class 1: Secondary | ICD-10-CM | POA: Diagnosis not present

## 2023-09-27 DIAGNOSIS — E782 Mixed hyperlipidemia: Secondary | ICD-10-CM

## 2023-09-27 NOTE — Progress Notes (Signed)
 Cardiology Office Note:  .   Date:  09/27/2023  ID:  Rachel Crane, DOB Sep 03, 1945, MRN [id number] PCP:  Lorenda Ishihara, MD  Former Cardiology Providers: Altamese McClain, APRN, FNP-C  [facility] Providers Cardiologist:  None , [facility] (established care 04/26/2020) Electrophysiologist:  None  Click to update primary MD,subspecialty MD or APP then REFRESH:1}    Chief Complaint  Patient presents with   Essential hypertension   Follow-up    1 year    History of Present Illness: .   Rachel Crane is a 78 y.o.  female whose past medical history and cardiovascular risk factors includes: Hypertension, Hyperlipidemia, diabetes, sleep apnea currently on CPAP, postmenopausal female, advanced age.   Patient was initially referred to the practice for preoperative risk stratification and then requested assistance with blood pressure management.  She presents today for 1 year follow-up visit.  Overall doing well from a cardiovascular standpoint.  Denies anginal chest pain or heart failure symptoms.  Over the last 1 year patient states that she is doing well she is recovering from her back surgery that she underwent in October 2024.  Patient stated due to postoperative pain she has been prescribed muscle relaxants and chronic pain medications which have affected her systolic blood pressures.  Her blood pressures have been labile.  She is no longer on chlorthalidone and currently takes losartan 100 mg p.o. daily.  She recently had an ER visit due to dizziness and underwent CT of the brain and MRI.  Review of Systems: .   Review of Systems  Cardiovascular:  Negative for chest pain, claudication, irregular heartbeat, leg swelling, near-syncope, orthopnea, palpitations, paroxysmal nocturnal dyspnea and syncope.  Respiratory:  Negative for shortness of breath.   Hematologic/Lymphatic: Negative for bleeding problem.    Studies Reviewed:   EKG: 09/26/2023: Sinus bradycardia, 51 bpm, without  underlying injury pattern.   Echocardiogram: 04/28/2020: Left ventricle cavity is normal in size. Moderate concentric hypertrophy of the left ventricle. Normal global wall motion. Normal LV systolic function with EF 64%. Doppler evidence of grade I (impaired) diastolic dysfunction, normal LAP. Left atrial cavity is mildly dilated. Aneurysmal interatrial septum without 2D or color Doppler evidence of interatrial shunt. Mild tricuspid regurgitation. Peak RA-RV gradient 17 mmHg. The IVC is not well visualized. No significant change compared to previous study in 2018.   September 18, 2023: LVEF: 55-60%, moderate LVH Diastolic Function: Indeterminate Mild left atrial dilatation Estimated RAP 3 mmHg See report for additional details  Stress Testing: Lexiscan Tetrofosmin stress test 05/02/2020: Low risk study  RADIOLOGY: NA  Risk Assessment/Calculations:   NA   Labs:       Latest Ref Rng & Units 09/25/2023    2:10 PM 04/23/2023    3:06 PM 03/11/2023    5:28 PM  CBC  WBC 4.0 - 10.5 K/uL 6.5  6.4  9.4   Hemoglobin 12.0 - 15.0 g/dL 78.2  95.6  9.3   Hematocrit 36.0 - 46.0 % 38.4  36.1  29.2   Platelets 150 - 400 K/uL 182  235  202        Latest Ref Rng & Units 09/25/2023    2:10 PM 04/23/2023    3:06 PM 03/11/2023    5:28 PM  BMP  Glucose 70 - 99 mg/dL 213  086  98   BUN 8 - 23 mg/dL 10  15  13    Creatinine 0.44 - 1.00 mg/dL 5.78  4.69  6.29   Sodium 135 - 145  mmol/L 137  137  138   Potassium 3.5 - 5.1 mmol/L 3.8  3.7  2.6   Chloride 98 - 111 mmol/L 104  102  111   CO2 22 - 32 mmol/L 25  25  21    Calcium 8.9 - 10.3 mg/dL 9.6  9.1  7.0       Latest Ref Rng & Units 09/25/2023    2:10 PM 04/23/2023    3:06 PM 03/11/2023    5:28 PM  CMP  Glucose 70 - 99 mg/dL 161  096  98   BUN 8 - 23 mg/dL 10  15  13    Creatinine 0.44 - 1.00 mg/dL 0.45  4.09  8.11   Sodium 135 - 145 mmol/L 137  137  138   Potassium 3.5 - 5.1 mmol/L 3.8  3.7  2.6   Chloride 98 - 111 mmol/L 104  102  111   CO2 22  - 32 mmol/L 25  25  21    Calcium 8.9 - 10.3 mg/dL 9.6  9.1  7.0   Total Protein 6.5 - 8.1 g/dL 7.1   5.4   Total Bilirubin 0.0 - 1.2 mg/dL 0.4   0.4   Alkaline Phos 38 - 126 U/L 75   42   AST 15 - 41 U/L 22   15   ALT 0 - 44 U/L 21   18     Lab Results  Component Value Date   CHOL 179 10/06/2018   HDL 50 10/06/2018   LDLCALC 114 (H) 10/06/2018   TRIG 76 10/06/2018   CHOLHDL 3.6 10/06/2018   No results for input(s): "LIPOA" in the last 8760 hours. No components found for: "NTPROBNP" No results for input(s): "PROBNP" in the last 8760 hours. No results for input(s): "TSH" in the last 8760 hours.  Physical Exam:    Today's Vitals   09/27/23 1635  BP: 130/84  Pulse: 71  Resp: 14  SpO2: 97%  Weight: 211 lb 6.4 oz (95.9 kg)  Height: 5\' 6"  (1.676 m)   Body mass index is 34.12 kg/m. Wt Readings from Last 3 Encounters:  09/27/23 211 lb 6.4 oz (95.9 kg)  09/25/23 205 lb (93 kg)  05/09/23 188 lb 4.8 oz (85.4 kg)    Physical Exam  Constitutional: No distress.  Age appropriate, hemodynamically stable.   Neck: No JVD present.  Cardiovascular: Normal rate, regular rhythm, S1 normal, S2 normal, intact distal pulses and normal pulses. Exam reveals no gallop, no S3 and no S4.  No murmur heard. Pulses:      Dorsalis pedis pulses are 2+ on the right side and 2+ on the left side.       Posterior tibial pulses are 2+ on the right side and 2+ on the left side.  Pulmonary/Chest: Effort normal and breath sounds normal. No stridor. She has no wheezes. She has no rales.  Abdominal: Soft. Bowel sounds are normal. She exhibits no distension. There is no abdominal tenderness.  Musculoskeletal:        General: No edema.     Cervical back: Neck supple.  Neurological: She is alert and oriented to person, place, and time. She has intact cranial nerves (2-12).  Skin: Skin is warm and moist.   Impression & Recommendation(s):  Impression:   ICD-10-CM   1. Essential hypertension  I10     2.  Mixed hyperlipidemia  E78.2 EKG 12-Lead    3. Non-insulin dependent type 2 diabetes mellitus (HCC)  E11.9  4. Class 1 obesity due to excess calories without serious comorbidity with body mass index (BMI) of 34.0 to 34.9 in adult  E66.811    E66.09    Z68.34        Recommendation(s):  Essential hypertension Office blood pressures are well-controlled. Home blood pressures can at times be labile secondary to chronic pain medications and muscle relaxants as she is status post back surgery. Patient stated recently she has been asked to discontinue pain medications and muscle relaxers and only be on Tylenol for pain control. Advised her to continue to check her blood pressures at home. Reemphasized importance of low-salt diet. Continue losartan 100 mg p.o. daily. Continue Jardiance 10 mg p.o. daily  Mixed hyperlipidemia Continue rosuvastatin 20 mg p.o. nightly  Non-insulin dependent type 2 diabetes mellitus (HCC) Reemphasized importance of glycemic control. Currently on ARB, SGLT2 inhibitors, and statin therapy  Class 1 obesity due to excess calories without serious comorbidity with body mass index (BMI) of 34.0 to 34.9 in adult Body mass index is 34.12 kg/m. I reviewed with her importance of diet, regular physical activity/exercise, weight loss.   Patient is educated on the importance of increasing physical activity gradually as tolerated with a goal of moderate intensity exercise for 30 minutes a day 5 days a week.  Orders Placed:  Orders Placed This Encounter  Procedures   EKG 12-Lead     Final Medication List:   No orders of the defined types were placed in this encounter.   Medications Discontinued During This Encounter  Medication Reason   albuterol (VENTOLIN HFA) 108 (90 Base) MCG/ACT inhaler Patient Preference   Ascorbic Acid (VITAMIN C) 100 MG tablet Patient Preference   promethazine-dextromethorphan (PROMETHAZINE-DM) 6.25-15 MG/5ML syrup Completed Course    predniSONE (DELTASONE) 20 MG tablet Completed Course   oxyCODONE-acetaminophen (PERCOCET/ROXICET) 5-325 MG tablet Completed Course   ondansetron (ZOFRAN) 4 MG tablet Patient Preference   methocarbamol (ROBAXIN) 500 MG tablet Completed Course   meclizine (ANTIVERT) 25 MG tablet Completed Course   diazepam (VALIUM) 5 MG tablet Patient Preference   cephALEXin (KEFLEX) 250 MG capsule Completed Course   carboxymethylcellulose (REFRESH PLUS) 0.5 % SOLN Completed Course   budesonide-formoterol (SYMBICORT) 80-4.5 MCG/ACT inhaler Patient Preference   acetic acid-hydrocortisone (VOSOL-HC) OTIC solution Patient Preference     Current Outpatient Medications:    Dermatological Products, Misc. [facility]) lotion, Apply twice a day to the body as a moisturizer, Disp: 225 g, Rfl: 3   empagliflozin (JARDIANCE) 10 MG TABS tablet, Take 10 mg by mouth daily., Disp: , Rfl:    fexofenadine (ALLEGRA ALLERGY) 180 MG tablet, Take 180 mg by mouth daily as needed for allergies., Disp: , Rfl:    fluticasone (FLONASE) 50 MCG/ACT nasal spray, Place 1 spray into both nostrils daily as needed (nasal congestion)., Disp: 16 mL, Rfl: 0   gabapentin (NEURONTIN) 300 MG capsule, Take 2 capsules (600 mg total) by mouth 3 (three) times daily. (Patient taking differently: Take 300 mg by mouth 3 (three) times daily.), Disp: 180 capsule, Rfl: 2   latanoprost (XALATAN) 0.005 % ophthalmic solution, Place 1 drop into both eyes at bedtime., Disp: , Rfl:    loratadine (CLARITIN) 10 MG tablet, Take 1 tablet (10 mg total) by mouth daily. (Patient taking differently: Take 10 mg by mouth daily as needed for allergies.), Disp: 30 tablet, Rfl: 5   losartan (COZAAR) 100 MG tablet, Take 1 tablet (100 mg total) by mouth daily at 10 pm., Disp: 30 tablet, Rfl: 3  Multiple Vitamin (MULTIVITAMIN) tablet, Take 1 tablet by mouth daily., Disp: , Rfl:    Potassium Chloride ER 20 MEQ TBCR, Take 1 tablet by mouth daily., Disp: , Rfl:    rosuvastatin (CRESTOR)  20 MG tablet, Take 1 tablet (20 mg total) by mouth at bedtime., Disp: 90 tablet, Rfl: 1   traMADol (ULTRAM) 50 MG tablet, Take 1 tablet (50 mg total) by mouth every 6 (six) hours as needed for moderate pain (pain score 4-6)., Disp: 30 tablet, Rfl: 1   triamcinolone ointment (KENALOG) 0.1 %, Apply 1 Application topically 2 (two) times daily as needed., Disp: 453.6 g, Rfl: 1  Consent:   NA  Disposition:    1 year follow up.   Overall cardiovascular comorbidities are managed well and is on good medical therapy and responding well.  Recommend following up on as needed basis.  However patient is quite concerned given her age and would like to follow-up once more in 1 year sooner if needed.  If she remains stable she will entertain the idea of purulent follow-up.  Her questions and concerns were addressed to her satisfaction. She voices understanding of the recommendations provided during this encounter.    Signed, Tessa Lerner, DO, [facility]  [facility]  [address] [place], [place] [address] 09/27/2023 5:45 PM

## 2023-09-27 NOTE — Patient Instructions (Signed)
 Medication Instructions:  Your physician recommends that you continue on your current medications as directed. Please refer to the Current Medication list given to you today.  *If you need a refill on your cardiac medications before your next appointment, please call your pharmacy*  Lab Work: None ordered today. If you have labs (blood work) drawn today and your tests are completely normal, you will receive your results only by: MyChart Message (if you have MyChart) OR A paper copy in the mail If you have any lab test that is abnormal or we need to change your treatment, we will call you to review the results.  Testing/Procedures: None ordered today.  Follow-Up: At [facility], you and your health needs are our priority.  As part of our continuing mission to provide you with exceptional heart care, we have created designated Provider Care Teams.  These Care Teams include your primary Cardiologist (physician) and Advanced Practice Providers (APPs -  Physician Assistants and Nurse Practitioners) who all work together to provide you with the care you need, when you need it.  We recommend signing up for the patient portal called "MyChart".  Sign up information is provided on this After Visit Summary.  MyChart is used to connect with patients for Virtual Visits (Telemedicine).  Patients are able to view lab/test results, encounter notes, upcoming appointments, etc.  Non-urgent messages can be sent to your provider as well.   To learn more about what you can do with MyChart, go to [email or web address].    Your next appointment:   1 year(s)  The format for your next appointment:   In Person  Provider:   Dr. Odis Hollingshead

## 2023-09-28 LAB — URINE CULTURE: Culture: 80000 — AB

## 2023-09-29 ENCOUNTER — Telehealth (HOSPITAL_BASED_OUTPATIENT_CLINIC_OR_DEPARTMENT_OTHER): Payer: Self-pay | Admitting: *Deleted

## 2023-09-29 NOTE — Telephone Encounter (Signed)
 Post ED Visit - Positive Culture Follow-up  Culture report reviewed by antimicrobial stewardship pharmacist: [facility] []  Enzo Bi, Pharm.D. []  Celedonio Miyamoto, [address].D., BCPS AQ-ID []  Garvin Fila, Pharm.D., BCPS []  Georgina Pillion, Pharm.D., BCPS []  Cyril, [place].D., BCPS, AAHIVP []  Estella Husk, Pharm.D., BCPS, AAHIVP []  Lysle Pearl, PharmD, BCPS []  Phillips Climes, PharmD, BCPS []  Agapito Games, PharmD, BCPS []  Verlan Friends, PharmD []  Mervyn Gay, PharmD, BCPS [x]  Delmar Landau, PharmD  [facility] []  Len Childs, PharmD []  Greer Pickerel, PharmD []  Adalberto Cole, PharmD []  Perlie Gold, Rph []  Lonell Face) Jean Rosenthal, PharmD []  Earl Many, PharmD []  Junita Push, PharmD []  Dorna Leitz, PharmD []  Terrilee Files, PharmD []  Lynann Beaver, PharmD []  Keturah Barre, PharmD []  Loralee Pacas, PharmD []  Bernadene Person, PharmD   Positive urine culture Treated with Cephalexin, organism sensitive to the same and no further patient follow-up is required at this time.  Rachel Crane 09/29/2023, 2:08 PM

## 2023-09-30 DIAGNOSIS — M4326 Fusion of spine, lumbar region: Secondary | ICD-10-CM | POA: Diagnosis not present

## 2023-09-30 DIAGNOSIS — M5442 Lumbago with sciatica, left side: Secondary | ICD-10-CM | POA: Diagnosis not present

## 2023-09-30 DIAGNOSIS — R262 Difficulty in walking, not elsewhere classified: Secondary | ICD-10-CM | POA: Diagnosis not present

## 2023-09-30 DIAGNOSIS — H8110 Benign paroxysmal vertigo, unspecified ear: Secondary | ICD-10-CM | POA: Diagnosis not present

## 2023-09-30 DIAGNOSIS — R519 Headache, unspecified: Secondary | ICD-10-CM | POA: Diagnosis not present

## 2023-09-30 DIAGNOSIS — R350 Frequency of micturition: Secondary | ICD-10-CM | POA: Diagnosis not present

## 2023-09-30 DIAGNOSIS — R42 Dizziness and giddiness: Secondary | ICD-10-CM | POA: Diagnosis not present

## 2023-09-30 DIAGNOSIS — M6281 Muscle weakness (generalized): Secondary | ICD-10-CM | POA: Diagnosis not present

## 2023-09-30 DIAGNOSIS — I10 Essential (primary) hypertension: Secondary | ICD-10-CM | POA: Diagnosis not present

## 2023-10-04 ENCOUNTER — Ambulatory Visit: Attending: Internal Medicine | Admitting: Physical Therapy

## 2023-10-04 VITALS — BP 165/63 | HR 69

## 2023-10-04 DIAGNOSIS — R42 Dizziness and giddiness: Secondary | ICD-10-CM | POA: Insufficient documentation

## 2023-10-04 DIAGNOSIS — R2681 Unsteadiness on feet: Secondary | ICD-10-CM | POA: Insufficient documentation

## 2023-10-04 NOTE — Therapy (Signed)
 OUTPATIENT PHYSICAL THERAPY VESTIBULAR EVALUATION     Patient Name: JAELEAH SMYSER MRN: [id number] DOB:1945-11-17, 78 y.o., female Today's Date: 10/04/2023  END OF SESSION:  PT End of Session - 10/04/23 1023     Visit Number 1    Number of Visits 9    Date for PT Re-Evaluation 12/03/23    Authorization Type MEDICARE    PT Start Time 1020    PT Stop Time 1100    PT Time Calculation (min) 40 min    Activity Tolerance Patient tolerated treatment well   limited by dizziness   Behavior During Therapy [facility] for tasks assessed/performed             Past Medical History:  Diagnosis Date   Abnormal stress ECG 10/06/2018   Aortic valve sclerosis 10/06/2018   Arthritis    Asthma    Bilateral carotid bruits 10/06/2018   Headache    H/O bad HA, since using CPAP- she has had improvement with that issue    Hypertension    Laboratory examination 10/06/2018   OSA on CPAP    settting - 8, uses CPAP q night    Pre-diabetes    Shingles    Past Surgical History:  Procedure Laterality Date   CHOLECYSTECTOMY  1998   TOTAL KNEE ARTHROPLASTY Right 09/19/2017   Procedure: RIGHT TOTAL KNEE ARTHROPLASTY;  Surgeon: Tarry Kos, MD;  Location: [facility] OR;  Service: Orthopedics;  Laterality: Right;   TOTAL KNEE ARTHROPLASTY Left 06/06/2020   Procedure: LEFT TOTAL KNEE ARTHROPLASTY;  Surgeon: Tarry Kos, MD;  Location: [facility] OR;  Service: Orthopedics;  Laterality: Left;   TRANSFORAMINAL LUMBAR INTERBODY FUSION (TLIF) WITH PEDICLE SCREW FIXATION 1 LEVEL Left 05/02/2023   Procedure: LEFT-SIDED LUMBAR THREE- LUMBAR FOUR TRANSFORAMINAL LUMBAR INTERBODY FUSION AND DECOMPRESSION WITH INSTRUMENTATION AND ALLOGRAFT;  Surgeon: Estill Bamberg, MD;  Location: [facility] OR;  Service: Orthopedics;  Laterality: Left;   TUBAL LIGATION     Patient Active Problem List   Diagnosis Date Noted   Radiculopathy, lumbar region 05/02/2023   Status post total right knee replacement 02/15/2022   Chronic low back pain 02/15/2022    Moderate persistent asthma without complication 11/03/2021   Gastroesophageal reflux disease with esophagitis 11/03/2021   Status post total left knee replacement 06/06/2020   Positive ANA (antinuclear antibody) 04/25/2020   Primary osteoarthritis of left knee 04/14/2020   Lumbar radiculopathy 06/26/2019   Left-sided low back pain with left-sided sciatica 06/26/2019   Thoracic spinal stenosis 04/18/2019   Seasonal allergies 12/04/2018   Seasonal and perennial allergic rhinoconjunctivitis 10/10/2018   Moderate persistent asthma with acute exacerbation 10/10/2018   Pruritus 10/10/2018   Aortic valve sclerosis 10/06/2018   Bilateral carotid bruits 10/06/2018   Abnormal stress ECG 10/06/2018   Laboratory examination 10/06/2018   Palpitations 10/06/2018   Total knee replacement status 09/19/2017   Sprain of anterior talofibular ligament of right ankle 12/13/2016    PCP:  Lorenda Ishihara, MD REFERRING PROVIDER:  Lorenda Ishihara, MD  REFERRING DIAG: H81.10 (ICD-10-CM) - Benign paroxysmal vertigo, unspecified ear   THERAPY DIAG:  Dizziness and giddiness  Unsteadiness on feet  ONSET DATE: 10/01/2023  Rationale for Evaluation and Treatment: Rehabilitation  SUBJECTIVE:   SUBJECTIVE STATEMENT: Only taking Meclizine when she really needs it. Haven't taken it in 4 days. Went to the ER on March 12th for dizziness. Had an MRI and it was normal. Notes that her head does not feel right. Had back surgery in October 2024 and reports  going to therapy for this 2 days a week. Reports one time she took a muscle relaxer with Tylenol and had spinning dizziness. Stopped taking the muscle relaxer. Still has dizziness when taking Gabapentin or Tylenol, but not has bad as she was in the hospital. Sometimes will hear a ringing. When lying down in the bed will notice room spinning.   Pt accompanied by: self  PERTINENT HISTORY: PMH: Lt TKA 06/06/20, Rt TKA 09/19/17, HTN, Aortic valve sclerosis,  asthma, bilateral carotid bruits, HA, CPAP, shingles, pre-diabetes, L3-4 TLIF 10/17   PAIN:  Are you having pain? No  Vitals:   10/04/23 1043  BP: (!) 165/63  Pulse: 69     PRECAUTIONS: None  FALLS: Has patient fallen in last 6 months? No  LIVING ENVIRONMENT: Lives with: lives alone  PLOF: Independent Currently working part time as an associate at [employer]  Still driving   PATIENT GOALS: Wants to feel better, have head feel better   OBJECTIVE:  Note: Objective measures were completed at Evaluation unless otherwise noted.  DIAGNOSTIC FINDINGS: MRI brain 09/25/23: IMPRESSION: 1. No acute intracranial abnormality. 2. Findings of chronic small vessel ischemia.  COGNITION: Overall cognitive status: Within functional limits for tasks assessed   Cervical ROM:   WNL    GAIT: Gait pattern: WFL Distance walked: Clinic distances Assistive device utilized: None Level of assistance: Modified independence Comments: No over unsteadiness noted during eval with pt ambulating in and out of clinic    VESTIBULAR ASSESSMENT:  GENERAL OBSERVATION: Ambulates in with no AD.    SYMPTOM BEHAVIOR:  Subjective history: See above.   Non-Vestibular symptoms: headaches and pt reports some ringing in her ears too   Type of dizziness: Spinning/Vertigo  Frequency: Daily   Duration: "Reports every 2 or 3 minutes it would come on and then stop"  Aggravating factors: Induced by position change: lying supine and sit to stand and Induced by motion: bending down to the ground and holding head down   pt reports also feeling dizziness when she is watching TV   Relieving factors: closing eyes and lying down and relaxing   Progression of symptoms: unchanged  OCULOMOTOR EXAM:  Ocular Alignment: normal  Ocular ROM: No Limitations  Spontaneous Nystagmus: absent  Gaze-Induced Nystagmus: absent  Smooth Pursuits: intact and cues to keep head still, felt a pressure in her head   Saccades: intact  and ~2 saccadic beats from midline to R and L, pt reporting a pressure in her head   VESTIBULAR - OCULAR REFLEX:   Slow VOR: Normal  VOR Cancellation: Normal pt reporting some dizziness   Head-Impulse Test: HIT Right: positive, pt with a corrective saccade, and then pt needing to close her eyes and lean back in the chair, reporting incr symptoms on R side  HIT Left: When going to assess L side, pt immediately closing eyes so unable to get accurate assessment, pt then leaning backwards in chair and reporting incr dizziness.     POSITIONAL TESTING: Will assess at next session   MOTION SENSITIVITY:  Motion Sensitivity Quotient Intensity: 0 = none, 1 = Lightheaded, 2 = Mild, 3 = Moderate, 4 = Severe, 5 = Vomiting  Intensity  1. Sitting to supine   2. Supine to L side   3. Supine to R side   4. Supine to sitting   5. L Hallpike-Dix   6. Up from L    7. R Hallpike-Dix   8. Up from R    9. Sitting,  head tipped to L knee   10. Head up from L knee   11. Sitting, head tipped to R knee   12. Head up from R knee   13. Sitting head turns x5 4  14.Sitting head nods x5 4  15. In stance, 180 turn to L    16. In stance, 180 turn to R                                                                                                                              TREATMENT DATE: N/A during eval   PATIENT EDUCATION: Education details: Clinical findings, POC, will finish remainder of vestibular assessment at next session  Person educated: Patient Education method: Explanation Education comprehension: verbalized understanding  HOME EXERCISE PROGRAM: Will provide at future session   GOALS: Goals reviewed with patient? Yes  SHORT TERM GOALS: ALL STGS = LTGS  LONG TERM GOALS: Target date: 11/01/2023  Pt will be independent with final HEP for dizziness in order to build upon functional gains made in therapy. Baseline:  Goal status: INITIAL  2.  MSQ to finish being assessed with goal  written.  Baseline:  Goal status: INITIAL  3.  Vestibular assessment to be finished with goal written as appropriate.  Baseline:  Goal status: INITIAL   ASSESSMENT:  CLINICAL IMPRESSION: Patient is a 78 year old female referred to Neuro OPPT for BPPV.   Pt's PMH is significant for: Lt TKA 06/06/20, Rt TKA 09/19/17, HTN, Aortic valve sclerosis, asthma, bilateral carotid bruits, HA, CPAP, shingles, pre-diabetes, L3-4 TLIF 10/17 . Pt has been having dizziness for a couple months and was seen in the ER on March 12th. MRI was normal. The following deficits were present during the exam: dizziness, decr activity tolerance. Unable to get accurate HIT assessments due to pt immediately closing her eyes after assessment due to incr dizziness. With head motions, pt reporting severe dizziness as apart of MSQ. Due to time constraints, will have to finish further vestibular assessment at next session.  Pt would benefit from skilled PT to address these impairments and functional limitations to maximize functional mobility independence   OBJECTIVE IMPAIRMENTS: decreased activity tolerance, decreased balance, and dizziness.   ACTIVITY LIMITATIONS: bending, standing, transfers, bed mobility, and locomotion level  PARTICIPATION LIMITATIONS: community activity  PERSONAL FACTORS: Age, Behavior pattern, Past/current experiences, Time since onset of injury/illness/exacerbation, and 3+ comorbidities: Lt TKA 06/06/20, Rt TKA 09/19/17, HTN, Aortic valve sclerosis, asthma, bilateral carotid bruits, HA, CPAP, shingles, pre-diabetes, L3-4 TLIF 10/17   are also affecting patient's functional outcome.   REHAB POTENTIAL: Good  CLINICAL DECISION MAKING: Evolving/moderate complexity  EVALUATION COMPLEXITY: Moderate   PLAN:  PT FREQUENCY: 1-2x/week  PT DURATION: 4 weeks  PLANNED INTERVENTIONS: 97164- PT Re-evaluation, 97110-Therapeutic exercises, 97530- Therapeutic activity, [id number]- Neuromuscular re-education, 97535-  Self Care, 81191- Manual therapy, [phone]- Gait training, [phone]- Canalith repositioning, Patient/Family education, Balance training, and Vestibular training  PLAN FOR NEXT SESSION: Finish vestibular  assessment - MSQ/positional testing. Check BP.    Drake Leach, PT, DPT 10/04/2023, 12:24 PM

## 2023-10-08 ENCOUNTER — Ambulatory Visit: Admitting: Physical Therapy

## 2023-10-09 DIAGNOSIS — N390 Urinary tract infection, site not specified: Secondary | ICD-10-CM | POA: Diagnosis not present

## 2023-10-09 DIAGNOSIS — I10 Essential (primary) hypertension: Secondary | ICD-10-CM | POA: Diagnosis not present

## 2023-10-09 DIAGNOSIS — R42 Dizziness and giddiness: Secondary | ICD-10-CM | POA: Diagnosis not present

## 2023-10-11 ENCOUNTER — Ambulatory Visit: Admitting: Physical Therapy

## 2023-10-11 ENCOUNTER — Encounter: Payer: Self-pay | Admitting: Physical Therapy

## 2023-10-11 VITALS — BP 149/83 | HR 76

## 2023-10-11 DIAGNOSIS — R262 Difficulty in walking, not elsewhere classified: Secondary | ICD-10-CM | POA: Diagnosis not present

## 2023-10-11 DIAGNOSIS — M6281 Muscle weakness (generalized): Secondary | ICD-10-CM | POA: Diagnosis not present

## 2023-10-11 DIAGNOSIS — M4326 Fusion of spine, lumbar region: Secondary | ICD-10-CM | POA: Diagnosis not present

## 2023-10-11 DIAGNOSIS — R42 Dizziness and giddiness: Secondary | ICD-10-CM | POA: Diagnosis not present

## 2023-10-11 DIAGNOSIS — R2681 Unsteadiness on feet: Secondary | ICD-10-CM

## 2023-10-11 NOTE — Therapy (Signed)
 OUTPATIENT PHYSICAL THERAPY VESTIBULAR TREATMENT     Patient Name: Rachel Crane MRN: [id number] DOB:[birth year], 78 y.o., female Today's Date: 10/11/2023  END OF SESSION:  PT End of Session - 10/11/23 1019     Visit Number 2    Number of Visits 9    Date for PT Re-Evaluation 12/03/23    Authorization Type MEDICARE    PT Start Time 1017    PT Stop Time 1058    PT Time Calculation (min) 41 min    Activity Tolerance Patient tolerated treatment well   limited by dizziness   Behavior During Therapy [facility] for tasks assessed/performed             Past Medical History:  Diagnosis Date   Abnormal stress ECG 10/06/2018   Aortic valve sclerosis 10/06/2018   Arthritis    Asthma    Bilateral carotid bruits 10/06/2018   Headache    H/O bad HA, since using CPAP- she has had improvement with that issue    Hypertension    Laboratory examination 10/06/2018   OSA on CPAP    settting - 8, uses CPAP q night    Pre-diabetes    Shingles    Past Surgical History:  Procedure Laterality Date   CHOLECYSTECTOMY  1998   TOTAL KNEE ARTHROPLASTY Right 09/19/2017   Procedure: RIGHT TOTAL KNEE ARTHROPLASTY;  Surgeon: Tarry Kos, MD;  Location: [facility] OR;  Service: Orthopedics;  Laterality: Right;   TOTAL KNEE ARTHROPLASTY Left 06/06/2020   Procedure: LEFT TOTAL KNEE ARTHROPLASTY;  Surgeon: Tarry Kos, MD;  Location: [facility] OR;  Service: Orthopedics;  Laterality: Left;   TRANSFORAMINAL LUMBAR INTERBODY FUSION (TLIF) WITH PEDICLE SCREW FIXATION 1 LEVEL Left 05/02/2023   Procedure: LEFT-SIDED LUMBAR THREE- LUMBAR FOUR TRANSFORAMINAL LUMBAR INTERBODY FUSION AND DECOMPRESSION WITH INSTRUMENTATION AND ALLOGRAFT;  Surgeon: Estill Bamberg, MD;  Location: [facility] OR;  Service: Orthopedics;  Laterality: Left;   TUBAL LIGATION     Patient Active Problem List   Diagnosis Date Noted   Radiculopathy, lumbar region 05/02/2023   Status post total right knee replacement 02/15/2022   Chronic low back pain 02/15/2022    Moderate persistent asthma without complication 11/03/2021   Gastroesophageal reflux disease with esophagitis 11/03/2021   Status post total left knee replacement 06/06/2020   Positive ANA (antinuclear antibody) 04/25/2020   Primary osteoarthritis of left knee 04/14/2020   Lumbar radiculopathy 06/26/2019   Left-sided low back pain with left-sided sciatica 06/26/2019   Thoracic spinal stenosis 04/18/2019   Seasonal allergies 12/04/2018   Seasonal and perennial allergic rhinoconjunctivitis 10/10/2018   Moderate persistent asthma with acute exacerbation 10/10/2018   Pruritus 10/10/2018   Aortic valve sclerosis 10/06/2018   Bilateral carotid bruits 10/06/2018   Abnormal stress ECG 10/06/2018   Laboratory examination 10/06/2018   Palpitations 10/06/2018   Total knee replacement status 09/19/2017   Sprain of anterior talofibular ligament of right ankle 12/13/2016    PCP:  Lorenda Ishihara, MD REFERRING PROVIDER:  Lorenda Ishihara, MD  REFERRING DIAG: H81.10 (ICD-10-CM) - Benign paroxysmal vertigo, unspecified ear   THERAPY DIAG:  Dizziness and giddiness  Unsteadiness on feet  ONSET DATE: 10/01/2023  Rationale for Evaluation and Treatment: Rehabilitation  SUBJECTIVE:   SUBJECTIVE STATEMENT: Reports her back and foot have been bothering her. Haven't been taking the Meclizine. Still having dizziness laying down, looking at phone, and watching TV. Feels like things are spinning. Reports things feel better from the hospital, but not how she wants it to be. Finished  taking her anti-biotic 2 days ago due to potential UTI. Now taking less of the muscle relaxer to see if this has an effect on her dizziness.   Pt accompanied by: self  PERTINENT HISTORY: PMH: Lt TKA 06/06/20, Rt TKA 09/19/17, HTN, Aortic valve sclerosis, asthma, bilateral carotid bruits, HA, CPAP, shingles, pre-diabetes, L3-4 TLIF 10/17   PAIN:  Are you having pain? No  Vitals:   10/11/23 1025  BP: (!)  149/83  Pulse: 76      PRECAUTIONS: None  FALLS: Has patient fallen in last 6 months? No  LIVING ENVIRONMENT: Lives with: lives alone  PLOF: Independent Currently working part time as an associate at [employer]  Still driving   PATIENT GOALS: Wants to feel better, have head feel better   OBJECTIVE:  Note: Objective measures were completed at Evaluation unless otherwise noted.  DIAGNOSTIC FINDINGS: MRI brain 09/25/23: IMPRESSION: 1. No acute intracranial abnormality. 2. Findings of chronic small vessel ischemia.  COGNITION: Overall cognitive status: Within functional limits for tasks assessed   Cervical ROM:   WNL    GAIT: Gait pattern: WFL Distance walked: Clinic distances Assistive device utilized: None Level of assistance: Modified independence Comments: No over unsteadiness noted during eval with pt ambulating in and out of clinic    VESTIBULAR ASSESSMENT:  GENERAL OBSERVATION: Ambulates in with no AD.    SYMPTOM BEHAVIOR:  Subjective history: See above.   Non-Vestibular symptoms: headaches and pt reports some ringing in her ears too   Type of dizziness: Spinning/Vertigo  Frequency: Daily   Duration: "Reports every 2 or 3 minutes it would come on and then stop"  Aggravating factors: Induced by position change: lying supine and sit to stand and Induced by motion: bending down to the ground and holding head down   pt reports also feeling dizziness when she is watching TV   Relieving factors: closing eyes and lying down and relaxing   Progression of symptoms: unchanged  OCULOMOTOR EXAM:  Ocular Alignment: normal  Ocular ROM: No Limitations  Spontaneous Nystagmus: absent  Gaze-Induced Nystagmus: absent  Smooth Pursuits: intact and cues to keep head still, felt a pressure in her head   Saccades: intact and ~2 saccadic beats from midline to R and L, pt reporting a pressure in her head   VESTIBULAR - OCULAR REFLEX:   Slow VOR: Normal  VOR Cancellation:  Normal pt reporting some dizziness   Head-Impulse Test: HIT Right: positive, pt with a corrective saccade, and then pt needing to close her eyes and lean back in the chair, reporting incr symptoms on R side  HIT Left: When going to assess L side, pt immediately closing eyes so unable to get accurate assessment, pt then leaning backwards in chair and reporting incr dizziness.     POSITIONAL TESTING: Will assess at next session   MOTION SENSITIVITY:  Motion Sensitivity Quotient Intensity: 0 = none, 1 = Lightheaded, 2 = Mild, 3 = Moderate, 4 = Severe, 5 = Vomiting  Intensity  1. Sitting to supine   2. Supine to L side   3. Supine to R side   4. Supine to sitting   5. L Hallpike-Dix   6. Up from L    7. R Hallpike-Dix   8. Up from R    9. Sitting, head tipped to L knee   10. Head up from L knee   11. Sitting, head tipped to R knee   12. Head up from R knee   13.  Sitting head turns x5 4  14.Sitting head nods x5 4  15. In stance, 180 turn to L    16. In stance, 180 turn to R                                                                                                                              TREATMENT DATE: 10/11/23  Therapeutic Activity:  Vitals:   10/11/23 1025  BP: (!) 149/83  Pulse: 76     POSITIONAL TESTING: Right Dix-Hallpike: no nystagmus and "a little dizzy"  Left Dix-Hallpike: no nystagmus and "a little dizzy"  Right Roll Test: no nystagmus and "a little dizzy"  Left Roll Test: no nystagmus and "a little dizzy"  Right Sidelying: no nystagmus and pt reporting dizziness, worse than L side  Left Sidelying: no nystagmus and pt reporting dizziness in position and when coming back up  NMR:   Gaze Adaptation: x1 Viewing Horizontal: Position: Seated, Time: 30 seconds, Reps: 3, and Comment: performs very slowly, feels a little dizzy/spinny  x1 Viewing Vertical:  Position: Seated, Time: 30 seconds, Reps: 3, and Comment: pt reports some dizziness, performs slowly    PATIENT EDUCATION: Education details: Results of positional testing and pt negative for BPPV, PT unsure of pt's etiology of dizziness at this time, slow VOR x1 addition to HEP and purpose of this exercise. Had discussion with pt regarding pt's medications with muscle relaxers and Tylenol, pt going to go over this medication management with her physician - as pt feels like medications could be contributing to her dizziness.  Person educated: Patient Education method: Explanation, Demonstration, Verbal cues, and Handouts Education comprehension: verbalized understanding, returned demonstration, and needs further education  HOME EXERCISE PROGRAM: Seated VOR in horizontal and vertical direction 30 seconds   GOALS: Goals reviewed with patient? Yes  SHORT TERM GOALS: ALL STGS = LTGS  LONG TERM GOALS: Target date: 11/01/2023  Pt will be independent with final HEP for dizziness in order to build upon functional gains made in therapy. Baseline:  Goal status: INITIAL  2.  Pt will rate head motions on MSQ at 2/5 or less in order to demo decr dizziness with head motions.  Baseline: 4/5  Goal status: INITIAL  3.  Pt report no dizziness with sit <> sidelying in order to demo improved dizziness with bed mobility.  Baseline:  Goal status: INITIAL   ASSESSMENT:  CLINICAL IMPRESSION: Pt's BP elevated, but still WFL to participate in PT. Assessed positional testing during today's session with pt negative for BPPV and had no nystagmus. Pt did report feeling dizzy,with pt having more symptoms on the R side. Pt needing to rest between each position due to dizziness when coming upright. Remainder of session focused on initiating HEP with slow VOR with pt more symptomatic in the horizontal direction. PT unsure of etiology of pt's dizziness at this time. Discussed having pt reach out to her physician regarding her medications as pt reports  she feels like it could be medication related. Will continue per  POC.     OBJECTIVE IMPAIRMENTS: decreased activity tolerance, decreased balance, and dizziness.   ACTIVITY LIMITATIONS: bending, standing, transfers, bed mobility, and locomotion level  PARTICIPATION LIMITATIONS: community activity  PERSONAL FACTORS: Age, Behavior pattern, Past/current experiences, Time since onset of injury/illness/exacerbation, and 3+ comorbidities: Lt TKA 06/06/20, Rt TKA 09/19/17, HTN, Aortic valve sclerosis, asthma, bilateral carotid bruits, HA, CPAP, shingles, pre-diabetes, L3-4 TLIF 10/17   are also affecting patient's functional outcome.   REHAB POTENTIAL: Good  CLINICAL DECISION MAKING: Evolving/moderate complexity  EVALUATION COMPLEXITY: Moderate   PLAN:  PT FREQUENCY: 1-2x/week  PT DURATION: 4 weeks  PLANNED INTERVENTIONS: 97164- PT Re-evaluation, 97110-Therapeutic exercises, 97530- Therapeutic activity, [id number]- Neuromuscular re-education, 97535- Self Care, [id number]- Manual therapy, [id number]- Gait training, [phone]- Canalith repositioning, Patient/Family education, Balance training, and Vestibular training  PLAN FOR NEXT SESSION: Check BP. Work on [employer] exercises, habituation tasks, saccades and smooth pursuits, unsure of etiology of pt's dizziness    Drake Leach, PT, DPT 10/11/2023, 11:10 AM

## 2023-10-11 NOTE — Patient Instructions (Signed)
 Gaze Stabilization: Sitting    Keeping eyes on target on wall a few feet away, tilt head down 15-30 and move head side to side for ___30_ seconds. Repeat while moving head up and down for _30___ seconds.  Perform 3 sets of each, go slowly.  Do __2__ sessions per day.  Gaze Stabilization: Tip Card  1.Target must remain in focus, not blurry, and appear stationary while head is in motion. 2.Perform exercises with small head movements (45 to either side of midline). 3.Can start off performing slowly  4.If you wear eyeglasses, be sure you can see target through lens (therapist will give specific instructions for bifocal / progressive lenses). 5.These exercises may provoke dizziness or nausea. Work through these symptoms. If too dizzy, slow head movement slightly. Rest between each exercise. 6.Exercises demand concentration; avoid distractions. 7.For safety, perform standing exercises close to a counter, wall, corner, or next to someone.   Copyright  VHI. All rights reserved.

## 2023-10-15 ENCOUNTER — Ambulatory Visit: Attending: Internal Medicine | Admitting: Physical Therapy

## 2023-10-15 ENCOUNTER — Encounter: Payer: Self-pay | Admitting: Physical Therapy

## 2023-10-15 VITALS — BP 132/70

## 2023-10-15 DIAGNOSIS — R2681 Unsteadiness on feet: Secondary | ICD-10-CM | POA: Insufficient documentation

## 2023-10-15 DIAGNOSIS — R42 Dizziness and giddiness: Secondary | ICD-10-CM | POA: Insufficient documentation

## 2023-10-15 NOTE — Therapy (Signed)
 OUTPATIENT PHYSICAL THERAPY VESTIBULAR TREATMENT     Patient Name: Rachel Crane MRN: [id number] DOB:11-Oct-1945, 78 y.o., female Today's Date: 10/15/2023  END OF SESSION:  PT End of Session - 10/15/23 1534     Visit Number 3    Number of Visits 9    Date for PT Re-Evaluation 12/03/23    Authorization Type MEDICARE    PT Start Time 1533    PT Stop Time 1611    PT Time Calculation (min) 38 min    Activity Tolerance Patient tolerated treatment well    Behavior During Therapy [facility] for tasks assessed/performed             Past Medical History:  Diagnosis Date   Abnormal stress ECG 10/06/2018   Aortic valve sclerosis 10/06/2018   Arthritis    Asthma    Bilateral carotid bruits 10/06/2018   Headache    H/O bad HA, since using CPAP- she has had improvement with that issue    Hypertension    Laboratory examination 10/06/2018   OSA on CPAP    settting - 8, uses CPAP q night    Pre-diabetes    Shingles    Past Surgical History:  Procedure Laterality Date   CHOLECYSTECTOMY  1998   TOTAL KNEE ARTHROPLASTY Right 09/19/2017   Procedure: RIGHT TOTAL KNEE ARTHROPLASTY;  Surgeon: Tarry Kos, MD;  Location: [facility] OR;  Service: Orthopedics;  Laterality: Right;   TOTAL KNEE ARTHROPLASTY Left 06/06/2020   Procedure: LEFT TOTAL KNEE ARTHROPLASTY;  Surgeon: Tarry Kos, MD;  Location: [facility] OR;  Service: Orthopedics;  Laterality: Left;   TRANSFORAMINAL LUMBAR INTERBODY FUSION (TLIF) WITH PEDICLE SCREW FIXATION 1 LEVEL Left 05/02/2023   Procedure: LEFT-SIDED LUMBAR THREE- LUMBAR FOUR TRANSFORAMINAL LUMBAR INTERBODY FUSION AND DECOMPRESSION WITH INSTRUMENTATION AND ALLOGRAFT;  Surgeon: Estill Bamberg, MD;  Location: [facility] OR;  Service: Orthopedics;  Laterality: Left;   TUBAL LIGATION     Patient Active Problem List   Diagnosis Date Noted   Radiculopathy, lumbar region 05/02/2023   Status post total right knee replacement 02/15/2022   Chronic low back pain 02/15/2022   Moderate persistent asthma  without complication 11/03/2021   Gastroesophageal reflux disease with esophagitis 11/03/2021   Status post total left knee replacement 06/06/2020   Positive ANA (antinuclear antibody) 04/25/2020   Primary osteoarthritis of left knee 04/14/2020   Lumbar radiculopathy 06/26/2019   Left-sided low back pain with left-sided sciatica 06/26/2019   Thoracic spinal stenosis 04/18/2019   Seasonal allergies 12/04/2018   Seasonal and perennial allergic rhinoconjunctivitis 10/10/2018   Moderate persistent asthma with acute exacerbation 10/10/2018   Pruritus 10/10/2018   Aortic valve sclerosis 10/06/2018   Bilateral carotid bruits 10/06/2018   Abnormal stress ECG 10/06/2018   Laboratory examination 10/06/2018   Palpitations 10/06/2018   Total knee replacement status 09/19/2017   Sprain of anterior talofibular ligament of right ankle 12/13/2016    PCP:  Lorenda Ishihara, MD REFERRING PROVIDER:  Lorenda Ishihara, MD  REFERRING DIAG: H81.10 (ICD-10-CM) - Benign paroxysmal vertigo, unspecified ear   THERAPY DIAG:  Dizziness and giddiness  Unsteadiness on feet  ONSET DATE: 10/01/2023  Rationale for Evaluation and Treatment: Rehabilitation  SUBJECTIVE:   SUBJECTIVE STATEMENT: Notes dizziness is not as bad. Just feels it when watching TV or when she is on her phone. Lying down isn't as bad now. Doing the best she can with the exercises   Pt accompanied by: self  PERTINENT HISTORY: PMH: Lt TKA 06/06/20, Rt TKA 09/19/17, HTN,  Aortic valve sclerosis, asthma, bilateral carotid bruits, HA, CPAP, shingles, pre-diabetes, L3-4 TLIF 10/17   PAIN:  Are you having pain? Reports back is feeling bothersome today   Vitals:   10/15/23 1541 10/15/23 1544  BP: (!) 143/104 132/70     PRECAUTIONS: None  FALLS: Has patient fallen in last 6 months? No  LIVING ENVIRONMENT: Lives with: lives alone  PLOF: Independent Currently working part time as an associate at [employer]  Still driving    PATIENT GOALS: Wants to feel better, have head feel better   OBJECTIVE:  Note: Objective measures were completed at Evaluation unless otherwise noted.  DIAGNOSTIC FINDINGS: MRI brain 09/25/23: IMPRESSION: 1. No acute intracranial abnormality. 2. Findings of chronic small vessel ischemia.  COGNITION: Overall cognitive status: Within functional limits for tasks assessed   Cervical ROM:   WNL    GAIT: Gait pattern: WFL Distance walked: Clinic distances Assistive device utilized: None Level of assistance: Modified independence Comments: No over unsteadiness noted during eval with pt ambulating in and out of clinic    VESTIBULAR ASSESSMENT:  GENERAL OBSERVATION: Ambulates in with no AD.    SYMPTOM BEHAVIOR:  Subjective history: See above.   Non-Vestibular symptoms: headaches and pt reports some ringing in her ears too   Type of dizziness: Spinning/Vertigo  Frequency: Daily   Duration: "Reports every 2 or 3 minutes it would come on and then stop"  Aggravating factors: Induced by position change: lying supine and sit to stand and Induced by motion: bending down to the ground and holding head down   pt reports also feeling dizziness when she is watching TV   Relieving factors: closing eyes and lying down and relaxing   Progression of symptoms: unchanged  OCULOMOTOR EXAM:  Ocular Alignment: normal  Ocular ROM: No Limitations  Spontaneous Nystagmus: absent  Gaze-Induced Nystagmus: absent  Smooth Pursuits: intact and cues to keep head still, felt a pressure in her head   Saccades: intact and ~2 saccadic beats from midline to R and L, pt reporting a pressure in her head   VESTIBULAR - OCULAR REFLEX:   Slow VOR: Normal  VOR Cancellation: Normal pt reporting some dizziness   Head-Impulse Test: HIT Right: positive, pt with a corrective saccade, and then pt needing to close her eyes and lean back in the chair, reporting incr symptoms on R side  HIT Left: When going to assess L  side, pt immediately closing eyes so unable to get accurate assessment, pt then leaning backwards in chair and reporting incr dizziness.     POSITIONAL TESTING: Will assess at next session   MOTION SENSITIVITY:  Motion Sensitivity Quotient Intensity: 0 = none, 1 = Lightheaded, 2 = Mild, 3 = Moderate, 4 = Severe, 5 = Vomiting  Intensity  1. Sitting to supine   2. Supine to L side   3. Supine to R side   4. Supine to sitting   5. L Hallpike-Dix   6. Up from L    7. R Hallpike-Dix   8. Up from R    9. Sitting, head tipped to L knee   10. Head up from L knee   11. Sitting, head tipped to R knee   12. Head up from R knee   13. Sitting head turns x5 4  14.Sitting head nods x5 4  15. In stance, 180 turn to L    16. In stance, 180 turn to R  TREATMENT DATE: 10/15/23  Self-Care: Vitals:   10/15/23 1541 10/15/23 1544  BP: (!) 143/104 132/70   First reading taken with an automatic cuff, 2nd assessed manually. Automatic cuff not reading correctly.   Pt reports currently seeing another therapist for her back pain at a different clinic and might be interested in coming to this clinic as it would be closer and easier for her. PT educating that pt would need a new referral. Pt also wondering if some exercises are irritating her back, educated pt to ask her PT regarding this that is seeing her for her back pain after her surgery   NMR:   Gaze Adaptation: x1 Viewing Horizontal: Position: Seated, Time: 30 seconds, Reps: 2, and Comment: pt reports some dizziness, cues for continuous head movement and trying to perform slightly faster with 2nd rep  x1 Viewing Vertical:  Position: Seated, Time: 30 seconds, Reps: 2, and Comment: pt reports some dizziness   Seated Smooth Pursuits Horizontal direction: 2 x 10 reps, cues for just moving eyes, mild dizziness  Vertical  direction: 2 x 10 reps, pt reports slightly dizzy, needs cues to move eyes   Seated Saccades using Edwyna Shell Chart Horizontal direction 3 sets of 5 lines, pt with decr accuracy at times, esp with middle lines   PATIENT EDUCATION: Education details: Additions to HEP - saccades and smooth pursuit, purpose of vestibular exercises and how they might make pt dizzy  Person educated: Patient Education method: Explanation, Demonstration, Verbal cues, and Handouts Education comprehension: verbalized understanding, returned demonstration, and needs further education  HOME EXERCISE PROGRAM: Seated VOR in horizontal and vertical direction 30 seconds  Seated Hart Chart in horizontal direction  Access Code: [email or web address] URL: [email or web address] Date: 10/15/2023 Prepared by: Sherlie Ban  Exercises - Seated Horizontal Smooth Pursuit  - 2 x daily - 7 x weekly - 2 sets - 10 reps - Seated Vertical Smooth Pursuit  - 2 x daily - 7 x weekly - 2 sets - 10 reps  GOALS: Goals reviewed with patient? Yes  SHORT TERM GOALS: ALL STGS = LTGS  LONG TERM GOALS: Target date: 11/01/2023  Pt will be independent with final HEP for dizziness in order to build upon functional gains made in therapy. Baseline:  Goal status: INITIAL  2.  Pt will rate head motions on MSQ at 2/5 or less in order to demo decr dizziness with head motions.  Baseline: 4/5  Goal status: INITIAL  3.  Pt report no dizziness with sit <> sidelying in order to demo improved dizziness with bed mobility.  Baseline:  Goal status: INITIAL   ASSESSMENT:  CLINICAL IMPRESSION: Pt's BP elevated with assessment with automatic cuff, but WNL when assessed by therapist with manual cuff. Progressed VOR x1 exercises to slightly incr speed, pt with mild dizziness. Added seated smooth pursuits and saccades using Edwyna Shell Chart to HEP. Pt reporting mild dizziness with smooth pursuits, horizontal > vertical. With saccades, pt with decr accuracy and  speed at times. Will continue per POC.     OBJECTIVE IMPAIRMENTS: decreased activity tolerance, decreased balance, and dizziness.   ACTIVITY LIMITATIONS: bending, standing, transfers, bed mobility, and locomotion level  PARTICIPATION LIMITATIONS: community activity  PERSONAL FACTORS: Age, Behavior pattern, Past/current experiences, Time since onset of injury/illness/exacerbation, and 3+ comorbidities: Lt TKA 06/06/20, Rt TKA 09/19/17, HTN, Aortic valve sclerosis, asthma, bilateral carotid bruits, HA, CPAP, shingles, pre-diabetes, L3-4 TLIF 10/17   are also affecting patient's functional outcome.   REHAB POTENTIAL: Good  CLINICAL DECISION MAKING: Evolving/moderate  complexity  EVALUATION COMPLEXITY: Moderate   PLAN:  PT FREQUENCY: 1-2x/week  PT DURATION: 4 weeks  PLANNED INTERVENTIONS: 97164- PT Re-evaluation, 97110-Therapeutic exercises, 97530- Therapeutic activity, [id number]- Neuromuscular re-education, 97535- Self Care, [id number]- Manual therapy, [id number]- Gait training, [phone]- Canalith repositioning, Patient/Family education, Balance training, and Vestibular training  PLAN FOR NEXT SESSION: Check BP. Work on [employer], habituation tasks, saccades and smooth pursuits    Drake Leach, PT, DPT 10/15/2023, 4:12 PM

## 2023-10-18 DIAGNOSIS — M545 Low back pain, unspecified: Secondary | ICD-10-CM | POA: Diagnosis not present

## 2023-10-18 DIAGNOSIS — M1712 Unilateral primary osteoarthritis, left knee: Secondary | ICD-10-CM | POA: Diagnosis not present

## 2023-10-18 DIAGNOSIS — M4696 Unspecified inflammatory spondylopathy, lumbar region: Secondary | ICD-10-CM | POA: Diagnosis not present

## 2023-10-21 ENCOUNTER — Ambulatory Visit: Admitting: Physical Therapy

## 2023-10-21 ENCOUNTER — Telehealth: Payer: Self-pay | Admitting: Physical Therapy

## 2023-10-21 NOTE — Telephone Encounter (Signed)
 Called pt in regards to no show appt today. Pt reports that she is not feeling well and meant to cancel. Pt to call back to re-schedule PT appt when she is feeling better (as this was pt's last scheduled appt).   Sherlie Ban, PT, DPT 10/21/23 9:51 AM   [facility] [address] [place], [place]  [address] Phone:  [phone] Fax:  [phone]

## 2023-11-05 ENCOUNTER — Ambulatory Visit (INDEPENDENT_AMBULATORY_CARE_PROVIDER_SITE_OTHER): Admitting: Orthopaedic Surgery

## 2023-11-05 ENCOUNTER — Other Ambulatory Visit: Payer: Self-pay | Admitting: Physical Medicine and Rehabilitation

## 2023-11-05 ENCOUNTER — Other Ambulatory Visit (INDEPENDENT_AMBULATORY_CARE_PROVIDER_SITE_OTHER)

## 2023-11-05 DIAGNOSIS — Z96652 Presence of left artificial knee joint: Secondary | ICD-10-CM

## 2023-11-05 DIAGNOSIS — M47816 Spondylosis without myelopathy or radiculopathy, lumbar region: Secondary | ICD-10-CM | POA: Diagnosis not present

## 2023-11-05 DIAGNOSIS — M545 Low back pain, unspecified: Secondary | ICD-10-CM

## 2023-11-05 DIAGNOSIS — Z96651 Presence of right artificial knee joint: Secondary | ICD-10-CM

## 2023-11-05 NOTE — Progress Notes (Signed)
 Office Visit Note   Patient: Rachel Crane           Date of Birth: September 05, 1945           MRN: [id number] Visit Date: 11/05/2023              Requested by: Arva Lathe, MD [address] [place],  [place] [address] PCP: Arva Lathe, MD   Assessment & Plan: Visit Diagnoses:  1. Status post total left knee replacement   2. Status post total right knee replacement     Plan: In regards to the knee replacement these look really good on x-rays.  I do not see any problems with them or any causes for pain.  Her symptoms seem to be consistent with lumbar spine pathology or could be diabetic neuropathy.  She has an appointment to see Dr. Jackee Marus in a couple weeks to discuss further.  Reassurance was provided that her knee implants are well-fixed and well-positioned.  Follow-Up Instructions: No follow-ups on file.   Orders:  Orders Placed This Encounter  Procedures   XR Knee 1-2 Views Left   XR Knee 1-2 Views Right   No orders of the defined types were placed in this encounter.     Procedures: No procedures performed   Clinical Data: No additional findings.   Subjective: Chief Complaint  Patient presents with   Other    Bilateral leg/knee pain    HPI Rachel Crane is a 78 year old female here for evaluation of bilateral knee pain.  Underwent sequential bilateral total knee replacements about 4 to 6 years ago.  She had back surgery by Dr. Jackee Marus about 6 months ago.  She is here for evaluation of numbness and tingling in her left leg and bilateral foot burning.  She reports chronic pain in both legs as well.  Denies any recent trauma. Review of Systems  Constitutional: Negative.   HENT: Negative.    Eyes: Negative.   Respiratory: Negative.    Cardiovascular: Negative.   Endocrine: Negative.   Musculoskeletal: Negative.   Neurological: Negative.   Hematological: Negative.   Psychiatric/Behavioral: Negative.    All other systems reviewed and  are negative.    Objective: Vital Signs: There were no vitals taken for this visit.  Physical Exam Vitals and nursing note reviewed.  Constitutional:      Appearance: She is well-developed.  HENT:     Head: Atraumatic.     Nose: Nose normal.  Eyes:     Extraocular Movements: Extraocular movements intact.  Cardiovascular:     Pulses: Normal pulses.  Pulmonary:     Effort: Pulmonary effort is normal.  Abdominal:     Palpations: Abdomen is soft.  Musculoskeletal:     Cervical back: Neck supple.  Skin:    General: Skin is warm.     Capillary Refill: Capillary refill takes less than 2 seconds.  Neurological:     Mental Status: She is alert. Mental status is at baseline.  Psychiatric:        Behavior: Behavior normal.        Thought Content: Thought content normal.        Judgment: Judgment normal.     Ortho Exam Exam of bilateral knees show fully healed surgical scars.  Excellent range of motion.  Collaterals are stable.  No joint effusion.  Normal strength and sensation. Specialty Comments:  No specialty comments available.  Imaging: XR Knee 1-2 Views Left Result Date: 11/05/2023 X-rays of the  left knee show a stable left total knee replacement in good alignment.   XR Knee 1-2 Views Right Result Date: 11/05/2023 X-rays of the right knee demonstrate a stable right total knee replacement in good alignment     PMFS History: Patient Active Problem List   Diagnosis Date Noted   Radiculopathy, lumbar region 05/02/2023   Status post total right knee replacement 02/15/2022   Chronic low back pain 02/15/2022   Moderate persistent asthma without complication 11/03/2021   Gastroesophageal reflux disease with esophagitis 11/03/2021   Status post total left knee replacement 06/06/2020   Positive ANA (antinuclear antibody) 04/25/2020   Primary osteoarthritis of left knee 04/14/2020   Lumbar radiculopathy 06/26/2019   Left-sided low back pain with left-sided sciatica  06/26/2019   Thoracic spinal stenosis 04/18/2019   Seasonal allergies 12/04/2018   Seasonal and perennial allergic rhinoconjunctivitis 10/10/2018   Moderate persistent asthma with acute exacerbation 10/10/2018   Pruritus 10/10/2018   Aortic valve sclerosis 10/06/2018   Bilateral carotid bruits 10/06/2018   Abnormal stress ECG 10/06/2018   Laboratory examination 10/06/2018   Palpitations 10/06/2018   Total knee replacement status 09/19/2017   Sprain of anterior talofibular ligament of right ankle 12/13/2016   Past Medical History:  Diagnosis Date   Abnormal stress ECG 10/06/2018   Aortic valve sclerosis 10/06/2018   Arthritis    Asthma    Bilateral carotid bruits 10/06/2018   Headache    H/O bad HA, since using CPAP- she has had improvement with that issue    Hypertension    Laboratory examination 10/06/2018   OSA on CPAP    settting - 8, uses CPAP q night    Pre-diabetes    Shingles     Family History  Problem Relation Age of Onset   Hypertension Mother    Arthritis Mother    Breast cancer Sister    Sleep apnea Neg Hx     Past Surgical History:  Procedure Laterality Date   CHOLECYSTECTOMY  1998   TOTAL KNEE ARTHROPLASTY Right 09/19/2017   Procedure: RIGHT TOTAL KNEE ARTHROPLASTY;  Surgeon: Wes Hamman, MD;  Location: [facility];  Service: Orthopedics;  Laterality: Right;   TOTAL KNEE ARTHROPLASTY Left 06/06/2020   Procedure: LEFT TOTAL KNEE ARTHROPLASTY;  Surgeon: Wes Hamman, MD;  Location: [facility];  Service: Orthopedics;  Laterality: Left;   TRANSFORAMINAL LUMBAR INTERBODY FUSION (TLIF) WITH PEDICLE SCREW FIXATION 1 LEVEL Left 05/02/2023   Procedure: LEFT-SIDED LUMBAR THREE- LUMBAR FOUR TRANSFORAMINAL LUMBAR INTERBODY FUSION AND DECOMPRESSION WITH INSTRUMENTATION AND ALLOGRAFT;  Surgeon: Virl Grimes, MD;  Location: [facility];  Service: Orthopedics;  Laterality: Left;   TUBAL LIGATION     Social History   Occupational History   Not on file  Tobacco Use   Smoking status:  Never   Smokeless tobacco: Never  Vaping Use   Vaping status: Never Used  Substance and Sexual Activity   Alcohol  use: Not Currently   Drug use: No   Sexual activity: Not Currently

## 2023-11-07 NOTE — Discharge Instructions (Signed)

## 2023-11-11 ENCOUNTER — Other Ambulatory Visit

## 2023-11-11 ENCOUNTER — Inpatient Hospital Stay: Admission: RE | Admit: 2023-11-11 | Source: Ambulatory Visit

## 2023-11-11 ENCOUNTER — Inpatient Hospital Stay
Admission: RE | Admit: 2023-11-11 | Discharge: 2023-11-11 | Disposition: A | Discharge status: Home / Self Care | Source: Ambulatory Visit | Attending: Physical Medicine and Rehabilitation | Admitting: Physical Medicine and Rehabilitation

## 2023-11-12 ENCOUNTER — Ambulatory Visit: Admitting: Orthopaedic Surgery

## 2023-11-15 ENCOUNTER — Encounter (HOSPITAL_COMMUNITY): Payer: Self-pay | Admitting: Emergency Medicine

## 2023-11-15 ENCOUNTER — Ambulatory Visit (HOSPITAL_COMMUNITY)
Admission: EM | Admit: 2023-11-15 | Discharge: 2023-11-15 | Disposition: A | Discharge status: Home / Self Care | Attending: Family Medicine | Admitting: Family Medicine

## 2023-11-15 DIAGNOSIS — R1010 Upper abdominal pain, unspecified: Secondary | ICD-10-CM | POA: Diagnosis not present

## 2023-11-15 DIAGNOSIS — I10 Essential (primary) hypertension: Secondary | ICD-10-CM | POA: Diagnosis not present

## 2023-11-15 DIAGNOSIS — R109 Unspecified abdominal pain: Secondary | ICD-10-CM | POA: Diagnosis not present

## 2023-11-15 HISTORY — DX: Pure hypercholesterolemia, unspecified: E78.00

## 2023-11-15 NOTE — Discharge Instructions (Addendum)
Patient will go to the ED for further evaluation.

## 2023-11-15 NOTE — ED Triage Notes (Signed)
 Pt had lemonade around 1p, chicken of the sea and crackers, then half hour to hour later felt then had to have BM. Then afterwards felt ok. At work later had BM again then cramps started in abd. Had fish and jerk chicken with rice for lunch. Reports felt fatigued and cramping started again all night. Had 3 cups ginger tea and broth.

## 2023-11-15 NOTE — ED Provider Notes (Addendum)
 [facility]    CSN: [id number] Arrival date & time: 11/15/23  2956      History   Chief Complaint Chief Complaint  Patient presents with   Abdominal Pain    HPI Rachel Crane is a 78 y.o. female.    Abdominal Pain Here for upper abdominal cramping and some generalized cramping. She had some yesterday after she ate and then improved with a BM, somewhat loose. No blood. No n/v.  Able to eat later in the day, and then started having some cramping again in the evening, and cramped all night. Rates the pain initially to staff as 4/10. No f/c.  Had a formed bowel movement this morning.  NKDA  Past medical history of diabetes; past surgical history includes open cholecystectomy 20 to 30 years ago.  p Past Medical History:  Diagnosis Date   Abnormal stress ECG 10/06/2018   Aortic valve sclerosis 10/06/2018   Arthritis    Asthma    Bilateral carotid bruits 10/06/2018   Headache    H/O bad HA, since using CPAP- she has had improvement with that issue    High cholesterol    Hypertension    Laboratory examination 10/06/2018   OSA on CPAP    settting - 8, uses CPAP q night    Pre-diabetes    Shingles     Patient Active Problem List   Diagnosis Date Noted   Radiculopathy, lumbar region 05/02/2023   Status post total right knee replacement 02/15/2022   Chronic low back pain 02/15/2022   Moderate persistent asthma without complication 11/03/2021   Gastroesophageal reflux disease with esophagitis 11/03/2021   Status post total left knee replacement 06/06/2020   Positive ANA (antinuclear antibody) 04/25/2020   Primary osteoarthritis of left knee 04/14/2020   Lumbar radiculopathy 06/26/2019   Left-sided low back pain with left-sided sciatica 06/26/2019   Thoracic spinal stenosis 04/18/2019   Seasonal allergies 12/04/2018   Seasonal and perennial allergic rhinoconjunctivitis 10/10/2018   Moderate persistent asthma with acute exacerbation 10/10/2018   Pruritus  10/10/2018   Aortic valve sclerosis 10/06/2018   Bilateral carotid bruits 10/06/2018   Abnormal stress ECG 10/06/2018   Laboratory examination 10/06/2018   Palpitations 10/06/2018   Total knee replacement status 09/19/2017   Sprain of anterior talofibular ligament of right ankle 12/13/2016    Past Surgical History:  Procedure Laterality Date   CHOLECYSTECTOMY  1998   TOTAL KNEE ARTHROPLASTY Right 09/19/2017   Procedure: RIGHT TOTAL KNEE ARTHROPLASTY;  Surgeon: Wes Hamman, MD;  Location: [facility];  Service: Orthopedics;  Laterality: Right;   TOTAL KNEE ARTHROPLASTY Left 06/06/2020   Procedure: LEFT TOTAL KNEE ARTHROPLASTY;  Surgeon: Wes Hamman, MD;  Location: [facility];  Service: Orthopedics;  Laterality: Left;   TRANSFORAMINAL LUMBAR INTERBODY FUSION (TLIF) WITH PEDICLE SCREW FIXATION 1 LEVEL Left 05/02/2023   Procedure: LEFT-SIDED LUMBAR THREE- LUMBAR FOUR TRANSFORAMINAL LUMBAR INTERBODY FUSION AND DECOMPRESSION WITH INSTRUMENTATION AND ALLOGRAFT;  Surgeon: Virl Grimes, MD;  Location: [facility];  Service: Orthopedics;  Laterality: Left;   TUBAL LIGATION      OB History   No obstetric history on file.      Home Medications    Prior to Admission medications   Medication Sig Start Date End Date Taking? Authorizing Provider  hydrochlorothiazide  (HYDRODIURIL ) 25 MG tablet Take 12.5-25 mg by mouth daily. 11/02/23  Yes [provider]  tiZANidine  (ZANAFLEX ) 4 MG tablet Take 4 mg by mouth every 6 (six) hours as needed. 11/02/23  Yes [provider]  Dermatological Products, Misc. [facility]) lotion Apply twice a day to the body as a moisturizer Patient not taking: Reported on 10/04/2023 05/16/22   Trudy Fusi, DO  empagliflozin  (JARDIANCE ) 10 MG TABS tablet Take 10 mg by mouth daily. Patient not taking: Reported on 10/04/2023 06/28/21   [provider]  fexofenadine (ALLEGRA ALLERGY) 180 MG tablet Take 180 mg by mouth daily as needed for allergies.    [provider]  fluticasone  (FLONASE ) 50 MCG/ACT nasal spray Place 1 spray into both nostrils daily as needed (nasal congestion). 11/29/22   Crain, Whitney L, PA  gabapentin  (NEURONTIN ) 300 MG capsule Take 2 capsules (600 mg total) by mouth 3 (three) times daily. Patient taking differently: Take 300 mg by mouth 3 (three) times daily. 11/05/22 09/27/23  Diedra Fowler, MD  latanoprost  (XALATAN ) 0.005 % ophthalmic solution Place 1 drop into both eyes at bedtime. 12/31/22   [provider]  loratadine  (CLARITIN ) 10 MG tablet Take 1 tablet (10 mg total) by mouth daily. Patient taking differently: Take 10 mg by mouth daily as needed for allergies. 09/04/22   Trudy Fusi, DO  losartan  (COZAAR ) 100 MG tablet Take 1 tablet (100 mg total) by mouth daily at 10 pm. 09/18/22   Tolia, Sunit, DO  Multiple Vitamin (MULTIVITAMIN) tablet Take 1 tablet by mouth daily.    [provider]  Potassium Chloride  ER 20 MEQ TBCR Take 1 tablet by mouth daily. Patient not taking: Reported on 10/04/2023 08/09/20   [provider]  rosuvastatin  (CRESTOR ) 20 MG tablet Take 1 tablet (20 mg total) by mouth at bedtime. Patient not taking: Reported on 10/04/2023 04/16/23   Albert Huff, Sunit, DO  traMADol  (ULTRAM ) 50 MG tablet Take 1 tablet (50 mg total) by mouth every 6 (six) hours as needed for moderate pain (pain score 4-6). 05/03/23   Virl Grimes, MD  triamcinolone  ointment (KENALOG ) 0.1 % Apply 1 Application topically 2 (two) times daily as needed. 02/08/23   Trudy Fusi, DO    Family History Family History  Problem Relation Age of Onset   Hypertension Mother    Arthritis Mother    Breast cancer Sister    Sleep apnea Neg Hx     Social History Social History   Tobacco Use   Smoking status: Never   Smokeless tobacco: Never  Vaping Use   Vaping status: Never Used  Substance Use Topics   Alcohol  use: Not Currently   Drug use: No     Allergies   Patient has no known allergies.   Review of  Systems Review of Systems  Gastrointestinal:  Positive for abdominal pain.     Physical Exam Triage Vital Signs ED Triage Vitals  Encounter Vitals Group     BP 11/15/23 1009 (!) 147/75     Systolic BP Percentile --      Diastolic BP Percentile --      Pulse Rate 11/15/23 1009 73     Resp 11/15/23 1009 18     Temp 11/15/23 1009 98.3 F (36.8 C)     Temp Source 11/15/23 1009 Oral     SpO2 11/15/23 1009 96 %     Weight --      Height --      Head Circumference --      Peak Flow --      Pain Score 11/15/23 1007 4     Pain Loc --      Pain Education --  Exclude from Growth Chart --    No data found.  Updated Vital Signs BP (!) 147/75 (BP Location: Left Arm)   Pulse 73   Temp 98.3 F (36.8 C) (Oral)   Resp 18   SpO2 96%   Visual Acuity Right Eye Distance:   Left Eye Distance:   Bilateral Distance:    Right Eye Near:   Left Eye Near:    Bilateral Near:     Physical Exam Vitals reviewed.  Constitutional:      Appearance: She is not ill-appearing, toxic-appearing or diaphoretic.     Comments: No acute distress, but she is rubbing her abdomen and rocking back and forth some in pain.  She acknowledges that she is hurting more than a 4 out of 10.  HENT:     Mouth/Throat:     Mouth: Mucous membranes are moist.  Cardiovascular:     Rate and Rhythm: Normal rate and regular rhythm.     Heart sounds: No murmur heard. Pulmonary:     Effort: Pulmonary effort is normal.     Breath sounds: Normal breath sounds.  Abdominal:     Comments: There is a well-healed vertical scar in her epigastrium.  She is very tender in her upper quadrants and mildly tender in her lower quadrants.  There are bowel sounds present.  Musculoskeletal:     Cervical back: Neck supple.  Lymphadenopathy:     Cervical: No cervical adenopathy.  Skin:    Coloration: Skin is not jaundiced or pale.  Neurological:     General: No focal deficit present.     Mental Status: She is alert and oriented  to person, place, and time.  Psychiatric:        Behavior: Behavior normal.      UC Treatments / Results  Labs (all labs ordered are listed, but only abnormal results are displayed) Labs Reviewed - No data to display  EKG   Radiology No results found.  Procedures Procedures (including critical care time)  Medications Ordered in UC Medications - No data to display  Initial Impression / Assessment and Plan / UC Course  I have reviewed the triage vital signs and the nursing notes.  Pertinent labs & imaging results that were available during my care of the patient were reviewed by me and considered in my medical decision making (see chart for details).     Her vital signs are reasonably normal, so I have asked her to proceed by private car to the emergency room for further evaluation.  She is very tender and in a good bit of pain, and I do not think we can adequately evaluate her here in the urgent care setting, nor can we adequately relieve her pain. Final Clinical Impressions(s) / UC Diagnoses   Final diagnoses:  Pain of upper abdomen     Discharge Instructions      Patient will go to the ED for further evaluation     ED Prescriptions   None    PDMP not reviewed this encounter.   Ann Keto, MD 11/15/23 1038    Ann Keto, MD 11/15/23 1140

## 2023-11-15 NOTE — ED Notes (Signed)
 Patient is being discharged from the Urgent Care and sent to the Emergency Department via POV. Per Dr Ellsworth Haas, patient is in need of higher level of care due to abdominal pain. Patient is aware and verbalizes understanding of plan of care.  Vitals:   11/15/23 1009  BP: (!) 147/75  Pulse: 73  Resp: 18  Temp: 98.3 F (36.8 C)  SpO2: 96%

## 2023-11-27 DIAGNOSIS — E1122 Type 2 diabetes mellitus with diabetic chronic kidney disease: Secondary | ICD-10-CM | POA: Diagnosis not present

## 2023-11-27 DIAGNOSIS — E7800 Pure hypercholesterolemia, unspecified: Secondary | ICD-10-CM | POA: Diagnosis not present

## 2023-11-27 DIAGNOSIS — J309 Allergic rhinitis, unspecified: Secondary | ICD-10-CM | POA: Diagnosis not present

## 2023-11-27 DIAGNOSIS — N182 Chronic kidney disease, stage 2 (mild): Secondary | ICD-10-CM | POA: Diagnosis not present

## 2023-11-27 DIAGNOSIS — I10 Essential (primary) hypertension: Secondary | ICD-10-CM | POA: Diagnosis not present

## 2023-11-27 DIAGNOSIS — M519 Unspecified thoracic, thoracolumbar and lumbosacral intervertebral disc disorder: Secondary | ICD-10-CM | POA: Diagnosis not present

## 2023-11-27 DIAGNOSIS — J45909 Unspecified asthma, uncomplicated: Secondary | ICD-10-CM | POA: Diagnosis not present

## 2023-11-27 DIAGNOSIS — K219 Gastro-esophageal reflux disease without esophagitis: Secondary | ICD-10-CM | POA: Diagnosis not present

## 2023-12-05 ENCOUNTER — Ambulatory Visit: Payer: Medicare Other | Admitting: Adult Health

## 2023-12-11 DIAGNOSIS — I10 Essential (primary) hypertension: Secondary | ICD-10-CM | POA: Diagnosis not present

## 2023-12-11 DIAGNOSIS — F411 Generalized anxiety disorder: Secondary | ICD-10-CM | POA: Diagnosis not present

## 2023-12-11 DIAGNOSIS — L299 Pruritus, unspecified: Secondary | ICD-10-CM | POA: Diagnosis not present

## 2023-12-15 NOTE — Progress Notes (Deleted)
 PATIENT: Rachel Crane DOB: [birth year]  REASON FOR VISIT: follow up HISTORY FROM: patient PRIMARY NEUROLOGIST:   HISTORY OF PRESENT ILLNESS: Today 12/15/23  HISTORY 11/29/2022: I reviewed her CPAP compliance data from 09/04/2022 through 10/03/2022, which is a total of 30 days, during which time she used her machine every night with percent use days greater than 4 hours and 87%, indicating very good compliance, average usage of 6 hours and 22 minutes, residual AHI mildly elevated at 9.1 and has increased since last year, leak acceptable and with significant fluctuation, 95th percentile at 14.4 L/min on a pressure of 10 cm with EPR of 2.  Her set up date was 09/08/2022.  She has a [employer] air sense 11 AutoSet machine.  Her DME company is [employer].  She reports overall doing well with her new machine.  She does have some air leakage from time to time, feels like sometimes the mask is too tight and sometimes it is too loose, she does have to adjust the straps on it day-to-day basis.  She utilizes a medium F20 AirTouch fullface mask from [employer].  She is motivated to continue with treatment.  She feels that her sleep is good quality, Epworth sleepiness score is 1 out of 24.  She is up-to-date with her supplies but does have some significant mouth dryness at times.  She uses the distilled water.  When we plugged in the machine and looked at her settings, her humidifier is set for 4, we mutually agreed to set it to 5 at this time.  She stays active.  She has 2 daughters and 1 son, 1 daughter is a Optometrist, the other daughter is an Programmer, systems, school principal, and her son is a Armed forces operational officer.   REVIEW OF SYSTEMS: Out of a complete 14 system review of symptoms, the patient complains only of the following symptoms, and all other reviewed systems are negative.  FSS ESS  ALLERGIES: No Known Allergies  HOME MEDICATIONS: Outpatient Medications Prior to Visit  Medication Sig Dispense Refill    Dermatological Products, Misc. [facility]) lotion Apply twice a day to the body as a moisturizer (Patient not taking: Reported on 10/04/2023) 225 g 3   empagliflozin  (JARDIANCE ) 10 MG TABS tablet Take 10 mg by mouth daily. (Patient not taking: Reported on 10/04/2023)     fexofenadine (ALLEGRA ALLERGY) 180 MG tablet Take 180 mg by mouth daily as needed for allergies.     fluticasone  (FLONASE ) 50 MCG/ACT nasal spray Place 1 spray into both nostrils daily as needed (nasal congestion). 16 mL 0   gabapentin  (NEURONTIN ) 300 MG capsule Take 2 capsules (600 mg total) by mouth 3 (three) times daily. (Patient taking differently: Take 300 mg by mouth 3 (three) times daily.) 180 capsule 2   hydrochlorothiazide  (HYDRODIURIL ) 25 MG tablet Take 12.5-25 mg by mouth daily.     latanoprost  (XALATAN ) 0.005 % ophthalmic solution Place 1 drop into both eyes at bedtime.     loratadine  (CLARITIN ) 10 MG tablet Take 1 tablet (10 mg total) by mouth daily. (Patient taking differently: Take 10 mg by mouth daily as needed for allergies.) 30 tablet 5   losartan  (COZAAR ) 100 MG tablet Take 1 tablet (100 mg total) by mouth daily at 10 pm. 30 tablet 3   Multiple Vitamin (MULTIVITAMIN) tablet Take 1 tablet by mouth daily.     Potassium Chloride  ER 20 MEQ TBCR Take 1 tablet by mouth daily. (Patient not taking: Reported on 10/04/2023)     rosuvastatin  (  CRESTOR ) 20 MG tablet Take 1 tablet (20 mg total) by mouth at bedtime. (Patient not taking: Reported on 10/04/2023) 90 tablet 1   tiZANidine  (ZANAFLEX ) 4 MG tablet Take 4 mg by mouth every 6 (six) hours as needed.     traMADol  (ULTRAM ) 50 MG tablet Take 1 tablet (50 mg total) by mouth every 6 (six) hours as needed for moderate pain (pain score 4-6). 30 tablet 1   triamcinolone  ointment (KENALOG ) 0.1 % Apply 1 Application topically 2 (two) times daily as needed. 453.6 g 1   No facility-administered medications prior to visit.    PAST MEDICAL HISTORY: Past Medical History:  Diagnosis Date    Abnormal stress ECG 10/06/2018   Aortic valve sclerosis 10/06/2018   Arthritis    Asthma    Bilateral carotid bruits 10/06/2018   Headache    H/O bad HA, since using CPAP- she has had improvement with that issue    High cholesterol    Hypertension    Laboratory examination 10/06/2018   OSA on CPAP    settting - 8, uses CPAP q night    Pre-diabetes    Shingles     PAST SURGICAL HISTORY: Past Surgical History:  Procedure Laterality Date   CHOLECYSTECTOMY  1998   TOTAL KNEE ARTHROPLASTY Right 09/19/2017   Procedure: RIGHT TOTAL KNEE ARTHROPLASTY;  Surgeon: Wes Hamman, MD;  Location: [facility];  Service: Orthopedics;  Laterality: Right;   TOTAL KNEE ARTHROPLASTY Left 06/06/2020   Procedure: LEFT TOTAL KNEE ARTHROPLASTY;  Surgeon: Wes Hamman, MD;  Location: [facility];  Service: Orthopedics;  Laterality: Left;   TRANSFORAMINAL LUMBAR INTERBODY FUSION (TLIF) WITH PEDICLE SCREW FIXATION 1 LEVEL Left 05/02/2023   Procedure: LEFT-SIDED LUMBAR THREE- LUMBAR FOUR TRANSFORAMINAL LUMBAR INTERBODY FUSION AND DECOMPRESSION WITH INSTRUMENTATION AND ALLOGRAFT;  Surgeon: Virl Grimes, MD;  Location: [facility] OR;  Service: Orthopedics;  Laterality: Left;   TUBAL LIGATION      FAMILY HISTORY: Family History  Problem Relation Age of Onset   Hypertension Mother    Arthritis Mother    Breast cancer Sister    Sleep apnea Neg Hx     SOCIAL HISTORY: Social History   Socioeconomic History   Marital status: Widowed    Spouse name: Not on file   Number of children: 3   Years of education: Not on file   Highest education level: Not on file  Occupational History   Not on file  Tobacco Use   Smoking status: Never   Smokeless tobacco: Never  Vaping Use   Vaping status: Never Used  Substance and Sexual Activity   Alcohol  use: Not Currently   Drug use: No   Sexual activity: Not Currently  Other Topics Concern   Not on file  Social History Narrative   Not on file   Social Drivers of Health    Financial Resource Strain: Not on file  Food Insecurity: No Food Insecurity (05/03/2023)   Hunger Vital Sign    Worried About Running Out of Food in the Last Year: Never true    Ran Out of Food in the Last Year: Never true  Transportation Needs: No Transportation Needs (05/03/2023)   PRAPARE - Administrator, Civil Service (Medical): No    Lack of Transportation (Non-Medical): No  Physical Activity: Not on file  Stress: Not on file  Social Connections: Not on file  Intimate Partner Violence: Not At Risk (05/03/2023)   Humiliation, Afraid, Rape, and Kick questionnaire  Fear of Current or Ex-Partner: No    Emotionally Abused: No    Physically Abused: No    Sexually Abused: No      PHYSICAL EXAM  There were no vitals filed for this visit. There is no height or weight on file to calculate BMI.  Generalized: Well developed, in no acute distress  Chest: Lungs clear to auscultation bilaterally  Neurological examination  Mentation: Alert oriented to time, place, history taking. Follows all commands speech and language fluent Cranial nerve II-XII: Extraocular movements were full, visual field were full on confrontational test Head turning and shoulder shrug  were normal and symmetric. Motor: The motor testing reveals 5 over 5 strength of all 4 extremities. Good symmetric motor tone is noted throughout.  Sensory: Sensory testing is intact to soft touch on all 4 extremities. No evidence of extinction is noted.  Gait and station: Gait is normal.    DIAGNOSTIC DATA (LABS, IMAGING, TESTING) - I reviewed patient records, labs, notes, testing and imaging myself where available.  Lab Results  Component Value Date   WBC 6.5 09/25/2023   HGB 12.4 09/25/2023   HCT 38.4 09/25/2023   MCV 81.0 09/25/2023   PLT 182 09/25/2023      Component Value Date/Time   NA 137 09/25/2023 1410   NA 144 11/10/2018 0850   K 3.8 09/25/2023 1410   CL 104 09/25/2023 1410   CO2 25  09/25/2023 1410   GLUCOSE 141 (H) 09/25/2023 1410   BUN 10 09/25/2023 1410   BUN 15 11/10/2018 0850   CREATININE 0.92 09/25/2023 1410   CALCIUM  9.6 09/25/2023 1410   PROT 7.1 09/25/2023 1410   PROT 7.2 11/10/2018 0850   ALBUMIN  3.8 09/25/2023 1410   ALBUMIN  4.6 11/10/2018 0850   AST 22 09/25/2023 1410   ALT 21 09/25/2023 1410   ALKPHOS 75 09/25/2023 1410   BILITOT 0.4 09/25/2023 1410   BILITOT 0.5 11/10/2018 0850   GFRNONAA >60 09/25/2023 1410   GFRAA 60 (L) 04/18/2019 0031   Lab Results  Component Value Date   CHOL 179 10/06/2018   HDL 50 10/06/2018   LDLCALC 114 (H) 10/06/2018   TRIG 76 10/06/2018   CHOLHDL 3.6 10/06/2018   Lab Results  Component Value Date   HGBA1C 7.4 (H) 03/30/2021   Lab Results  Component Value Date   VITAMINB12 721 06/23/2018   Lab Results  Component Value Date   TSH 1.325 03/30/2021      ASSESSMENT AND PLAN 79 y.o. year old female  has a past medical history of Abnormal stress ECG (10/06/2018), Aortic valve sclerosis (10/06/2018), Arthritis, Asthma, Bilateral carotid bruits (10/06/2018), Headache, High cholesterol, Hypertension, Laboratory examination (10/06/2018), OSA on CPAP, Pre-diabetes, and Shingles. here with:  OSA on CPAP  - CPAP compliance excellent - Good treatment of AHI  - Encourage patient to use CPAP nightly and > 4 hours each night - F/U in 1 year or sooner if needed   * No order type specified * No orders of the defined types were placed in this encounter.   Clem Currier, MSN, NP-C 12/15/2023, 1:59 PM [facility] [address] [place], [place] [address] [phone]

## 2023-12-17 ENCOUNTER — Ambulatory Visit: Admitting: Adult Health

## 2023-12-18 NOTE — Progress Notes (Deleted)
 PATIENT: Rachel Crane DOB: 1945-10-22  REASON FOR VISIT: follow up HISTORY FROM: patient PRIMARY NEUROLOGIST:   HISTORY OF PRESENT ILLNESS: Today 12/18/23  HISTORY 11/29/2022: I reviewed her CPAP compliance data from 09/04/2022 through 10/03/2022, which is a total of 30 days, during which time she used her machine every night with percent use days greater than 4 hours and 87%, indicating very good compliance, average usage of 6 hours and 22 minutes, residual AHI mildly elevated at 9.1 and has increased since last year, leak acceptable and with significant fluctuation, 95th percentile at 14.4 L/min on a pressure of 10 cm with EPR of 2.  Her set up date was 09/08/2022.  She has a [employer] air sense 11 AutoSet machine.  Her DME company is [employer].  She reports overall doing well with her new machine.  She does have some air leakage from time to time, feels like sometimes the mask is too tight and sometimes it is too loose, she does have to adjust the straps on it day-to-day basis.  She utilizes a medium F20 AirTouch fullface mask from [employer].  She is motivated to continue with treatment.  She feels that her sleep is good quality, Epworth sleepiness score is 1 out of 24.  She is up-to-date with her supplies but does have some significant mouth dryness at times.  She uses the distilled water.  When we plugged in the machine and looked at her settings, her humidifier is set for 4, we mutually agreed to set it to 5 at this time.  She stays active.  She has 2 daughters and 1 son, 1 daughter is a Optometrist, the other daughter is an Programmer, systems, school principal, and her son is a Armed forces operational officer.   REVIEW OF SYSTEMS: Out of a complete 14 system review of symptoms, the patient complains only of the following symptoms, and all other reviewed systems are negative.  FSS ESS  ALLERGIES: No Known Allergies  HOME MEDICATIONS: Outpatient Medications Prior to Visit  Medication Sig Dispense Refill    Dermatological Products, Misc. [facility]) lotion Apply twice a day to the body as a moisturizer (Patient not taking: Reported on 10/04/2023) 225 g 3   empagliflozin  (JARDIANCE ) 10 MG TABS tablet Take 10 mg by mouth daily. (Patient not taking: Reported on 10/04/2023)     fexofenadine (ALLEGRA ALLERGY) 180 MG tablet Take 180 mg by mouth daily as needed for allergies.     fluticasone  (FLONASE ) 50 MCG/ACT nasal spray Place 1 spray into both nostrils daily as needed (nasal congestion). 16 mL 0   gabapentin  (NEURONTIN ) 300 MG capsule Take 2 capsules (600 mg total) by mouth 3 (three) times daily. (Patient taking differently: Take 300 mg by mouth 3 (three) times daily.) 180 capsule 2   hydrochlorothiazide  (HYDRODIURIL ) 25 MG tablet Take 12.5-25 mg by mouth daily.     latanoprost  (XALATAN ) 0.005 % ophthalmic solution Place 1 drop into both eyes at bedtime.     loratadine  (CLARITIN ) 10 MG tablet Take 1 tablet (10 mg total) by mouth daily. (Patient taking differently: Take 10 mg by mouth daily as needed for allergies.) 30 tablet 5   losartan  (COZAAR ) 100 MG tablet Take 1 tablet (100 mg total) by mouth daily at 10 pm. 30 tablet 3   Multiple Vitamin (MULTIVITAMIN) tablet Take 1 tablet by mouth daily.     Potassium Chloride  ER 20 MEQ TBCR Take 1 tablet by mouth daily. (Patient not taking: Reported on 10/04/2023)     rosuvastatin  (  CRESTOR ) 20 MG tablet Take 1 tablet (20 mg total) by mouth at bedtime. (Patient not taking: Reported on 10/04/2023) 90 tablet 1   tiZANidine  (ZANAFLEX ) 4 MG tablet Take 4 mg by mouth every 6 (six) hours as needed.     traMADol  (ULTRAM ) 50 MG tablet Take 1 tablet (50 mg total) by mouth every 6 (six) hours as needed for moderate pain (pain score 4-6). 30 tablet 1   triamcinolone  ointment (KENALOG ) 0.1 % Apply 1 Application topically 2 (two) times daily as needed. 453.6 g 1   No facility-administered medications prior to visit.    PAST MEDICAL HISTORY: Past Medical History:  Diagnosis Date    Abnormal stress ECG 10/06/2018   Aortic valve sclerosis 10/06/2018   Arthritis    Asthma    Bilateral carotid bruits 10/06/2018   Headache    H/O bad HA, since using CPAP- she has had improvement with that issue    High cholesterol    Hypertension    Laboratory examination 10/06/2018   OSA on CPAP    settting - 8, uses CPAP q night    Pre-diabetes    Shingles     PAST SURGICAL HISTORY: Past Surgical History:  Procedure Laterality Date   CHOLECYSTECTOMY  1998   TOTAL KNEE ARTHROPLASTY Right 09/19/2017   Procedure: RIGHT TOTAL KNEE ARTHROPLASTY;  Surgeon: Wes Hamman, MD;  Location: [facility];  Service: Orthopedics;  Laterality: Right;   TOTAL KNEE ARTHROPLASTY Left 06/06/2020   Procedure: LEFT TOTAL KNEE ARTHROPLASTY;  Surgeon: Wes Hamman, MD;  Location: [facility];  Service: Orthopedics;  Laterality: Left;   TRANSFORAMINAL LUMBAR INTERBODY FUSION (TLIF) WITH PEDICLE SCREW FIXATION 1 LEVEL Left 05/02/2023   Procedure: LEFT-SIDED LUMBAR THREE- LUMBAR FOUR TRANSFORAMINAL LUMBAR INTERBODY FUSION AND DECOMPRESSION WITH INSTRUMENTATION AND ALLOGRAFT;  Surgeon: Virl Grimes, MD;  Location: [facility];  Service: Orthopedics;  Laterality: Left;   TUBAL LIGATION      FAMILY HISTORY: Family History  Problem Relation Age of Onset   Hypertension Mother    Arthritis Mother    Breast cancer Sister    Sleep apnea Neg Hx     SOCIAL HISTORY: Social History   Socioeconomic History   Marital status: Widowed    Spouse name: Not on file   Number of children: 3   Years of education: Not on file   Highest education level: Not on file  Occupational History   Not on file  Tobacco Use   Smoking status: Never   Smokeless tobacco: Never  Vaping Use   Vaping status: Never Used  Substance and Sexual Activity   Alcohol  use: Not Currently   Drug use: No   Sexual activity: Not Currently  Other Topics Concern   Not on file  Social History Narrative   Not on file   Social Drivers of Health    Financial Resource Strain: Not on file  Food Insecurity: No Food Insecurity (05/03/2023)   Hunger Vital Sign    Worried About Running Out of Food in the Last Year: Never true    Ran Out of Food in the Last Year: Never true  Transportation Needs: No Transportation Needs (05/03/2023)   PRAPARE - Administrator, Civil Service (Medical): No    Lack of Transportation (Non-Medical): No  Physical Activity: Not on file  Stress: Not on file  Social Connections: Not on file  Intimate Partner Violence: Not At Risk (05/03/2023)   Humiliation, Afraid, Rape, and Kick questionnaire  Fear of Current or Ex-Partner: No    Emotionally Abused: No    Physically Abused: No    Sexually Abused: No      PHYSICAL EXAM  There were no vitals filed for this visit. There is no height or weight on file to calculate BMI.  Generalized: Well developed, in no acute distress  Chest: Lungs clear to auscultation bilaterally  Neurological examination  Mentation: Alert oriented to time, place, history taking. Follows all commands speech and language fluent Cranial nerve II-XII: Extraocular movements were full, visual field were full on confrontational test Head turning and shoulder shrug  were normal and symmetric. Motor: The motor testing reveals 5 over 5 strength of all 4 extremities. Good symmetric motor tone is noted throughout.  Sensory: Sensory testing is intact to soft touch on all 4 extremities. No evidence of extinction is noted.  Gait and station: Gait is normal.    DIAGNOSTIC DATA (LABS, IMAGING, TESTING) - I reviewed patient records, labs, notes, testing and imaging myself where available.  Lab Results  Component Value Date   WBC 6.5 09/25/2023   HGB 12.4 09/25/2023   HCT 38.4 09/25/2023   MCV 81.0 09/25/2023   PLT 182 09/25/2023      Component Value Date/Time   NA 137 09/25/2023 1410   NA 144 11/10/2018 0850   K 3.8 09/25/2023 1410   CL 104 09/25/2023 1410   CO2 25  09/25/2023 1410   GLUCOSE 141 (H) 09/25/2023 1410   BUN 10 09/25/2023 1410   BUN 15 11/10/2018 0850   CREATININE 0.92 09/25/2023 1410   CALCIUM  9.6 09/25/2023 1410   PROT 7.1 09/25/2023 1410   PROT 7.2 11/10/2018 0850   ALBUMIN  3.8 09/25/2023 1410   ALBUMIN  4.6 11/10/2018 0850   AST 22 09/25/2023 1410   ALT 21 09/25/2023 1410   ALKPHOS 75 09/25/2023 1410   BILITOT 0.4 09/25/2023 1410   BILITOT 0.5 11/10/2018 0850   GFRNONAA >60 09/25/2023 1410   GFRAA 60 (L) 04/18/2019 0031   Lab Results  Component Value Date   CHOL 179 10/06/2018   HDL 50 10/06/2018   LDLCALC 114 (H) 10/06/2018   TRIG 76 10/06/2018   CHOLHDL 3.6 10/06/2018   Lab Results  Component Value Date   HGBA1C 7.4 (H) 03/30/2021   Lab Results  Component Value Date   VITAMINB12 721 06/23/2018   Lab Results  Component Value Date   TSH 1.325 03/30/2021      ASSESSMENT AND PLAN 78 y.o. year old female  has a past medical history of Abnormal stress ECG (10/06/2018), Aortic valve sclerosis (10/06/2018), Arthritis, Asthma, Bilateral carotid bruits (10/06/2018), Headache, High cholesterol, Hypertension, Laboratory examination (10/06/2018), OSA on CPAP, Pre-diabetes, and Shingles. here with:  OSA on CPAP  - CPAP compliance excellent - Good treatment of AHI  - Encourage patient to use CPAP nightly and > 4 hours each night - F/U in 1 year or sooner if needed   * No order type specified * No orders of the defined types were placed in this encounter.   Clem Currier, MSN, NP-C 12/18/2023, 9:40 AM [facility] [address], [address] [place], [place] [address] [phone]

## 2023-12-19 ENCOUNTER — Encounter: Payer: Self-pay | Admitting: Family Medicine

## 2023-12-19 ENCOUNTER — Ambulatory Visit: Admitting: Adult Health

## 2023-12-25 ENCOUNTER — Encounter: Payer: Self-pay | Admitting: Allergy

## 2023-12-25 ENCOUNTER — Ambulatory Visit (INDEPENDENT_AMBULATORY_CARE_PROVIDER_SITE_OTHER): Admitting: Allergy

## 2023-12-25 ENCOUNTER — Other Ambulatory Visit: Payer: Self-pay

## 2023-12-25 VITALS — BP 130/78 | HR 71 | Temp 97.9°F | Resp 18 | Ht 66.0 in | Wt 208.7 lb

## 2023-12-25 DIAGNOSIS — H1013 Acute atopic conjunctivitis, bilateral: Secondary | ICD-10-CM

## 2023-12-25 DIAGNOSIS — J4540 Moderate persistent asthma, uncomplicated: Secondary | ICD-10-CM | POA: Diagnosis not present

## 2023-12-25 DIAGNOSIS — J302 Other seasonal allergic rhinitis: Secondary | ICD-10-CM | POA: Diagnosis not present

## 2023-12-25 DIAGNOSIS — L308 Other specified dermatitis: Secondary | ICD-10-CM | POA: Diagnosis not present

## 2023-12-25 DIAGNOSIS — J3089 Other allergic rhinitis: Secondary | ICD-10-CM | POA: Diagnosis not present

## 2023-12-25 MED ORDER — TRIAMCINOLONE ACETONIDE 0.1 % EX OINT
1.0000 | TOPICAL_OINTMENT | Freq: Two times a day (BID) | CUTANEOUS | 5 refills | Status: AC | PRN
Start: 2023-12-25 — End: ?

## 2023-12-25 MED ORDER — LEVOCETIRIZINE DIHYDROCHLORIDE 5 MG PO TABS: 5.0000 mg | ORAL_TABLET | Freq: Two times a day (BID) | ORAL | 5 refills | Status: AC

## 2023-12-25 MED ORDER — FAMOTIDINE 20 MG PO TABS: 20.0000 mg | ORAL_TABLET | Freq: Two times a day (BID) | ORAL | 5 refills | Status: AC

## 2023-12-25 MED ORDER — MONTELUKAST SODIUM 10 MG PO TABS: 10.0000 mg | ORAL_TABLET | Freq: Every day | ORAL | 5 refills | Status: AC

## 2023-12-25 MED ORDER — TRIAMCINOLONE ACETONIDE 0.1 % EX OINT: 1.0000 | TOPICAL_OINTMENT | Freq: Two times a day (BID) | CUTANEOUS | 1 refills | Status: AC | PRN

## 2023-12-25 NOTE — Progress Notes (Signed)
 Follow-up Note  RE: Rachel Crane MRN: [id number] DOB: 1946-01-12 Date of Office Visit: 12/25/2023   History of present illness: Rachel Crane is a 78 y.o. female presenting today for itching.  She has history of asthma, allergic rhinitis and atopic dermatitis.  She was last seen in the office on 05/16/2022 by Dr. Burdette Carolin. Discussed the use of AI scribe software for clinical note transcription with the patient, who gave verbal consent to proceed.  She has been experiencing generalized itching for the past three weeks, which is particularly bothersome at night. The itching is severe, requiring the use of a brush to scratch her back, and is accompanied by the appearance of bumps, described as a rash she can feel. She has been taking hydroxyzine  three times a day as needed, but it has not been effective. She also uses Benadryl  without significant relief.   She is not currently using any nasal sprays or asthma inhalers as she states she is not having any symptoms to use it for.  It appears that her last visit was recommended that she take Singulair .  Is unclear if she ever got this prescription or took this medication.  Her past medical history includes allergies to tree pollen, dust mites, and mold, identified through previous testing. She avoids regular detergents and uses contact Free options. She is concerned about potential allergens from her daughter's pets, including two cats and a dog, although they are kept upstairs while her daughter finishes the completion of her own house.  In her social history, she works part-time at [employer] to stay active and engaged.   Review of systems: 10pt ROS negative unless noted above in HPI  Past medical/social/surgical/family history have been reviewed and are unchanged unless specifically indicated below.  No changes  Medication List: Current Outpatient Medications  Medication Sig Dispense Refill   fexofenadine (ALLEGRA ALLERGY) 180 MG tablet Take  180 mg by mouth daily as needed for allergies.     fluticasone  (FLONASE ) 50 MCG/ACT nasal spray Place 1 spray into both nostrils daily as needed (nasal congestion). 16 mL 0   hydrochlorothiazide  (HYDRODIURIL ) 25 MG tablet Take 12.5-25 mg by mouth daily.     latanoprost  (XALATAN ) 0.005 % ophthalmic solution Place 1 drop into both eyes at bedtime.     loratadine  (CLARITIN ) 10 MG tablet Take 1 tablet (10 mg total) by mouth daily. (Patient taking differently: Take 10 mg by mouth daily as needed for allergies.) 30 tablet 5   Multiple Vitamin (MULTIVITAMIN) tablet Take 1 tablet by mouth daily.     tiZANidine  (ZANAFLEX ) 4 MG tablet Take 4 mg by mouth every 6 (six) hours as needed.     traMADol  (ULTRAM ) 50 MG tablet Take 1 tablet (50 mg total) by mouth every 6 (six) hours as needed for moderate pain (pain score 4-6). 30 tablet 1   triamcinolone  ointment (KENALOG ) 0.1 % Apply 1 Application topically 2 (two) times daily as needed. 453.6 g 1   gabapentin  (NEURONTIN ) 300 MG capsule Take 2 capsules (600 mg total) by mouth 3 (three) times daily. (Patient taking differently: Take 300 mg by mouth 3 (three) times daily.) 180 capsule 2   No current facility-administered medications for this visit.     Known medication allergies: No Known Allergies   Physical examination: Blood pressure 130/78, pulse 71, temperature 97.9 F (36.6 C), temperature source Temporal, resp. rate 18, height 5' 6 (1.676 m), weight 208 lb 11.2 oz (94.7 kg), SpO2 97%.  General: Alert,  interactive, in no acute distress. HEENT: PERRLA, TMs pearly gray, turbinates non-edematous without discharge, post-pharynx non erythematous. Neck: Supple without lymphadenopathy. Lungs: Clear to auscultation without wheezing, rhonchi or rales. {no increased work of breathing. CV: Normal S1, S2 without murmurs. Abdomen: Nondistended, nontender. Skin: Warm and dry, without lesions or rashes. Extremities:  No clubbing, cyanosis or edema. Neuro:    Grossly intact.  Diagnostics/Labs: Spirometry: FEV1: 1.39L 74%, FVC: 2.1L 86%, ratio consistent with nonobstructive pattern  Assessment and plan:   Pruritic dermatitis - Itching and rash most likely due to allergens (you are allergic to dust mites, tree pollen and molds).  However itching and rash can be caused by a variety of different triggers including illness/infection, foods, medications, stings, exercise, pressure, vibrations, extremes of temperature to name a few however majority of the time there is no identifiable trigger.   - start taking Xyzal  5mg  1 tab twice a day with Pepcid  20mg  1 tab twice a day.  This are primary and secondary antihistamines, respectively - start taking Singulair  10mg  1 tab at bedtime.  This is an antileukotriene that helps alongside antihistamines for allergy, breathing and itch/rash control.  If you notice any change in mood/behavior/sleep after starting Singulair  then stop this medication and let us  know.  Symptoms resolve after stopping the medication.  - keep skin moisturized - use Triamcinolone  twice a day as needed for itch/rash relief.   Asthma Your lung function testing looks good today Daily controller medication(s): Singulair  (montelukast ) 10mg  daily at night. May use albuterol  rescue inhaler 2 puffs every 4 to 6 hours as needed for shortness of breath, chest tightness, coughing, and wheezing. Monitor frequency of use.  Breathing control goals:  Full participation in all desired activities (may need albuterol  before activity) Albuterol  use two times or less a week on average (not counting use with activity) Cough interfering with sleep two times or less a month Oral steroids no more than once a year No hospitalizations   Rhinitis, allergic Continue allergen avoidance measures directed toward tree pollen, dust mite, and mold. Take Xyzal  and Singulair  as above Use Flonase  (fluticasone ) OR Nasacort  (triamcinolone ) nasal spray 2 sprays per nostril  once a day as needed for nasal congestion.  Nasal saline spray (i.e., Simply Saline) or nasal saline lavage (i.e., NeilMed) is recommended as needed and prior to medicated nasal sprays.   Follow up in 3 months or sooner if needed. I appreciate the opportunity to take part in Chasiti's care. Please do not hesitate to contact me with questions.  Sincerely,   Catha Clink, MD Allergy/Immunology [facility]

## 2023-12-25 NOTE — Progress Notes (Signed)
 PATIENT: Rachel Crane DOB: [birth year]  REASON FOR VISIT: follow up HISTORY FROM: patient PRIMARY NEUROLOGIST: Dr. Omar Bibber  Chief Complaint  Patient presents with   Follow-up    Pt in 5 alone Pt here for cpap f/u Pt states no questions or concerns for today's visit       HISTORY OF PRESENT ILLNESS: Today 12/25/23:  Rachel Crane is a 78 y.o. female with a history of OSA on CPAP. Returns today for follow-up.  Reports that CPAP is working well for her.  She does state that she likes her old machine better.  She states that the new mask and straps give out easily.  And insurance will not replace them.  Her download is below     HISTORY   REVIEW OF SYSTEMS: Out of a complete 14 system review of symptoms, the patient complains only of the following symptoms, and all other reviewed systems are negative.  FSS ESS  ALLERGIES: No Known Allergies  HOME MEDICATIONS: Outpatient Medications Prior to Visit  Medication Sig Dispense Refill   fexofenadine (ALLEGRA ALLERGY) 180 MG tablet Take 180 mg by mouth daily as needed for allergies.     fluticasone  (FLONASE ) 50 MCG/ACT nasal spray Place 1 spray into both nostrils daily as needed (nasal congestion). 16 mL 0   gabapentin  (NEURONTIN ) 300 MG capsule Take 2 capsules (600 mg total) by mouth 3 (three) times daily. (Patient taking differently: Take 300 mg by mouth 3 (three) times daily.) 180 capsule 2   hydrochlorothiazide  (HYDRODIURIL ) 25 MG tablet Take 12.5-25 mg by mouth daily.     latanoprost  (XALATAN ) 0.005 % ophthalmic solution Place 1 drop into both eyes at bedtime.     loratadine  (CLARITIN ) 10 MG tablet Take 1 tablet (10 mg total) by mouth daily. (Patient taking differently: Take 10 mg by mouth daily as needed for allergies.) 30 tablet 5   Multiple Vitamin (MULTIVITAMIN) tablet Take 1 tablet by mouth daily.     tiZANidine  (ZANAFLEX ) 4 MG tablet Take 4 mg by mouth every 6 (six) hours as needed.     traMADol  (ULTRAM ) 50 MG tablet Take  1 tablet (50 mg total) by mouth every 6 (six) hours as needed for moderate pain (pain score 4-6). 30 tablet 1   triamcinolone  ointment (KENALOG ) 0.1 % Apply 1 Application topically 2 (two) times daily as needed. 453.6 g 1   No facility-administered medications prior to visit.    PAST MEDICAL HISTORY: Past Medical History:  Diagnosis Date   Abnormal stress ECG 10/06/2018   Aortic valve sclerosis 10/06/2018   Arthritis    Asthma    Bilateral carotid bruits 10/06/2018   Headache    H/O bad HA, since using CPAP- she has had improvement with that issue    High cholesterol    Hypertension    Laboratory examination 10/06/2018   OSA on CPAP    settting - 8, uses CPAP q night    Pre-diabetes    Shingles     PAST SURGICAL HISTORY: Past Surgical History:  Procedure Laterality Date   CHOLECYSTECTOMY  1998   TOTAL KNEE ARTHROPLASTY Right 09/19/2017   Procedure: RIGHT TOTAL KNEE ARTHROPLASTY;  Surgeon: Wes Hamman, MD;  Location: [facility] OR;  Service: Orthopedics;  Laterality: Right;   TOTAL KNEE ARTHROPLASTY Left 06/06/2020   Procedure: LEFT TOTAL KNEE ARTHROPLASTY;  Surgeon: Wes Hamman, MD;  Location: [facility] OR;  Service: Orthopedics;  Laterality: Left;   TRANSFORAMINAL LUMBAR INTERBODY FUSION (TLIF) WITH PEDICLE  SCREW FIXATION 1 LEVEL Left 05/02/2023   Procedure: LEFT-SIDED LUMBAR THREE- LUMBAR FOUR TRANSFORAMINAL LUMBAR INTERBODY FUSION AND DECOMPRESSION WITH INSTRUMENTATION AND ALLOGRAFT;  Surgeon: Virl Grimes, MD;  Location: [facility] OR;  Service: Orthopedics;  Laterality: Left;   TUBAL LIGATION      FAMILY HISTORY: Family History  Problem Relation Age of Onset   Hypertension Mother    Arthritis Mother    Breast cancer Sister    Sleep apnea Neg Hx     SOCIAL HISTORY: Social History   Socioeconomic History   Marital status: Widowed    Spouse name: Not on file   Number of children: 3   Years of education: Not on file   Highest education level: Not on file  Occupational History    Not on file  Tobacco Use   Smoking status: Never   Smokeless tobacco: Never  Vaping Use   Vaping status: Never Used  Substance and Sexual Activity   Alcohol  use: Not Currently   Drug use: No   Sexual activity: Not Currently  Other Topics Concern   Not on file  Social History Narrative   Not on file   Social Drivers of Health   Financial Resource Strain: Not on file  Food Insecurity: No Food Insecurity (05/03/2023)   Hunger Vital Sign    Worried About Running Out of Food in the Last Year: Never true    Ran Out of Food in the Last Year: Never true  Transportation Needs: No Transportation Needs (05/03/2023)   PRAPARE - Administrator, Civil Service (Medical): No    Lack of Transportation (Non-Medical): No  Physical Activity: Not on file  Stress: Not on file  Social Connections: Not on file  Intimate Partner Violence: Not At Risk (05/03/2023)   Humiliation, Afraid, Rape, and Kick questionnaire    Fear of Current or Ex-Partner: No    Emotionally Abused: No    Physically Abused: No    Sexually Abused: No      PHYSICAL EXAM  Vitals:   12/26/23 0844  BP: 135/64  Pulse: 70  Weight: 210 lb (95.3 kg)  Height: 5' 6 (1.676 m)   Body mass index is 33.89 kg/m.  Generalized: Well developed, in no acute distress  Chest: Lungs clear to auscultation bilaterally  Neurological examination  Mentation: Alert oriented to time, place, history taking. Follows all commands speech and language fluent Cranial nerve II-XII: Facial symmetry noted   DIAGNOSTIC DATA (LABS, IMAGING, TESTING) - I reviewed patient records, labs, notes, testing and imaging myself where available.  Lab Results  Component Value Date   WBC 6.5 09/25/2023   HGB 12.4 09/25/2023   HCT 38.4 09/25/2023   MCV 81.0 09/25/2023   PLT 182 09/25/2023      Component Value Date/Time   NA 137 09/25/2023 1410   NA 144 11/10/2018 0850   K 3.8 09/25/2023 1410   CL 104 09/25/2023 1410   CO2 25  09/25/2023 1410   GLUCOSE 141 (H) 09/25/2023 1410   BUN 10 09/25/2023 1410   BUN 15 11/10/2018 0850   CREATININE 0.92 09/25/2023 1410   CALCIUM  9.6 09/25/2023 1410   PROT 7.1 09/25/2023 1410   PROT 7.2 11/10/2018 0850   ALBUMIN  3.8 09/25/2023 1410   ALBUMIN  4.6 11/10/2018 0850   AST 22 09/25/2023 1410   ALT 21 09/25/2023 1410   ALKPHOS 75 09/25/2023 1410   BILITOT 0.4 09/25/2023 1410   BILITOT 0.5 11/10/2018 0850   GFRNONAA >60 09/25/2023  1410   GFRAA 60 (L) 04/18/2019 0031   Lab Results  Component Value Date   CHOL 179 10/06/2018   HDL 50 10/06/2018   LDLCALC 114 (H) 10/06/2018   TRIG 76 10/06/2018   CHOLHDL 3.6 10/06/2018   Lab Results  Component Value Date   HGBA1C 7.4 (H) 03/30/2021   Lab Results  Component Value Date   VITAMINB12 721 06/23/2018   Lab Results  Component Value Date   TSH 1.325 03/30/2021      ASSESSMENT AND PLAN 78 y.o. year old female  has a past medical history of Abnormal stress ECG (10/06/2018), Aortic valve sclerosis (10/06/2018), Arthritis, Asthma, Bilateral carotid bruits (10/06/2018), Headache, High cholesterol, Hypertension, Laboratory examination (10/06/2018), OSA on CPAP, Pre-diabetes, and Shingles. here with:  OSA on CPAP  - CPAP compliance excellent - Good treatment of AHI  - Encourage patient to use CPAP nightly and > 4 hours each night - Order sent to DME company for mask refitting - F/U in 1 year or sooner if needed  At the end of the visit patient mention some concerns about her memory.  She reports that she has not discussed this with her PCP.  I advised her to first discuss with her PCP and if they felt that this was an organic memory disturbance they can send a referral and we would do a full workup from a neurology standpoint   Clem Currier, MSN, NP-C 12/25/2023, 11:30 AM [facility] [address] [place], [place] [address] [phone]

## 2023-12-25 NOTE — Patient Instructions (Addendum)
 Itching and Rash - Itching and rash most likely due to allergens (you are allergic to dust mites, tree pollen and molds).  However itching and rash can be caused by a variety of different triggers including illness/infection, foods, medications, stings, exercise, pressure, vibrations, extremes of temperature to name a few however majority of the time there is no identifiable trigger.   - start taking Xyzal  5mg  1 tab twice a day with Pepcid  20mg  1 tab twice a day.  This are primary and secondary antihistamines, respectively - start taking Singulair  10mg  1 tab at bedtime.  This is an antileukotriene that helps alongside antihistamines for allergy, breathing and itch/rash control.  If you notice any change in mood/behavior/sleep after starting Singulair  then stop this medication and let us  know.  Symptoms resolve after stopping the medication.  - keep skin moisturized - use Triamcinolone  twice a day as needed for itch/rash relief.   Breathing Your lung function testing looks good today Daily controller medication(s): Singulair  (montelukast ) 10mg  daily at night. May use albuterol  rescue inhaler 2 puffs every 4 to 6 hours as needed for shortness of breath, chest tightness, coughing, and wheezing. Monitor frequency of use.  Breathing control goals:  Full participation in all desired activities (may need albuterol  before activity) Albuterol  use two times or less a week on average (not counting use with activity) Cough interfering with sleep two times or less a month Oral steroids no more than once a year No hospitalizations   Environmental allergies Continue allergen avoidance measures directed toward tree pollen, dust mite, and mold. Take Xyzal  and Singulair  as above Use Flonase  (fluticasone ) OR Nasacort  (triamcinolone ) nasal spray 2 sprays per nostril once a day as needed for nasal congestion.  Nasal saline spray (i.e., Simply Saline) or nasal saline lavage (i.e., NeilMed) is recommended as needed  and prior to medicated nasal sprays.   Follow up in 3 months or sooner if needed.  Skin care recommendations  Bath time: Always use lukewarm water. AVOID very hot or cold water. Keep bathing time to 5-10 minutes. Do NOT use bubble bath. Use a mild soap and use just enough to wash the dirty areas. Do NOT scrub skin vigorously.  After bathing, pat dry your skin with a towel. Do NOT rub or scrub the skin.  Moisturizers and prescriptions:  ALWAYS apply moisturizers immediately after bathing (within 3 minutes). This helps to lock-in moisture. Use the moisturizer several times a day over the whole body. Good summer moisturizers include: Aveeno, CeraVe, Cetaphil. Good winter moisturizers include: Aquaphor, Vaseline, Cerave, Cetaphil, Eucerin, Vanicream. When using moisturizers along with medications, the moisturizer should be applied about one hour after applying the medication to prevent diluting effect of the medication or moisturize around where you applied the medications. When not using medications, the moisturizer can be continued twice daily as maintenance.  Laundry and clothing: Avoid laundry products with added color or perfumes. Use unscented hypo-allergenic laundry products such as Tide free, Cheer free & gentle, and All free and clear.  If the skin still seems dry or sensitive, you can try double-rinsing the clothes. Avoid tight or scratchy clothing such as wool. Do not use fabric softeners or dyer sheets.

## 2023-12-26 ENCOUNTER — Encounter: Payer: Self-pay | Admitting: Adult Health

## 2023-12-26 ENCOUNTER — Ambulatory Visit (INDEPENDENT_AMBULATORY_CARE_PROVIDER_SITE_OTHER): Admitting: Adult Health

## 2023-12-26 VITALS — BP 135/64 | HR 70 | Ht 66.0 in | Wt 210.0 lb

## 2023-12-26 DIAGNOSIS — G4733 Obstructive sleep apnea (adult) (pediatric): Secondary | ICD-10-CM | POA: Diagnosis not present

## 2023-12-26 NOTE — Patient Instructions (Signed)
 Continue using CPAP nightly and greater than 4 hours each night If your symptoms worsen or you develop new symptoms please let us know.

## 2024-01-20 ENCOUNTER — Telehealth: Payer: Self-pay | Admitting: Orthopaedic Surgery

## 2024-01-20 NOTE — Telephone Encounter (Signed)
 Patient came by. She would like to have PT here.

## 2024-01-20 NOTE — Telephone Encounter (Signed)
 I do not see where PT was discussed or ordered. Please advise if needed and what for.  Thanks!

## 2024-01-20 NOTE — Telephone Encounter (Signed)
 I have no clue.  Can someone clarify with her?  thanks

## 2024-01-21 ENCOUNTER — Other Ambulatory Visit: Payer: Self-pay

## 2024-01-21 DIAGNOSIS — Z96651 Presence of right artificial knee joint: Secondary | ICD-10-CM

## 2024-01-21 DIAGNOSIS — Z96652 Presence of left artificial knee joint: Secondary | ICD-10-CM

## 2024-01-21 NOTE — Telephone Encounter (Signed)
 Ordered and notified patient via voicemail.

## 2024-01-21 NOTE — Telephone Encounter (Signed)
Ok that's fine.  Thanks.

## 2024-01-21 NOTE — Telephone Encounter (Signed)
 Patient states that she needs PT for both her knees. They are dropping and I'm scared that I need to go into a wheelchair.

## 2024-01-27 DIAGNOSIS — M545 Low back pain, unspecified: Secondary | ICD-10-CM | POA: Diagnosis not present

## 2024-01-27 DIAGNOSIS — M5416 Radiculopathy, lumbar region: Secondary | ICD-10-CM | POA: Diagnosis not present

## 2024-01-30 DIAGNOSIS — M545 Low back pain, unspecified: Secondary | ICD-10-CM | POA: Diagnosis not present

## 2024-01-30 DIAGNOSIS — M4326 Fusion of spine, lumbar region: Secondary | ICD-10-CM | POA: Diagnosis not present

## 2024-01-30 DIAGNOSIS — M6281 Muscle weakness (generalized): Secondary | ICD-10-CM | POA: Diagnosis not present

## 2024-02-05 ENCOUNTER — Other Ambulatory Visit: Payer: Self-pay | Admitting: Orthopedic Surgery

## 2024-02-05 DIAGNOSIS — M545 Low back pain, unspecified: Secondary | ICD-10-CM

## 2024-02-07 DIAGNOSIS — M6281 Muscle weakness (generalized): Secondary | ICD-10-CM | POA: Diagnosis not present

## 2024-02-07 DIAGNOSIS — M4326 Fusion of spine, lumbar region: Secondary | ICD-10-CM | POA: Diagnosis not present

## 2024-02-07 DIAGNOSIS — M545 Low back pain, unspecified: Secondary | ICD-10-CM | POA: Diagnosis not present

## 2024-02-10 DIAGNOSIS — M6281 Muscle weakness (generalized): Secondary | ICD-10-CM | POA: Diagnosis not present

## 2024-02-10 DIAGNOSIS — M545 Low back pain, unspecified: Secondary | ICD-10-CM | POA: Diagnosis not present

## 2024-02-10 DIAGNOSIS — M4326 Fusion of spine, lumbar region: Secondary | ICD-10-CM | POA: Diagnosis not present

## 2024-02-11 ENCOUNTER — Ambulatory Visit: Admitting: Physical Therapy

## 2024-02-11 NOTE — Therapy (Incomplete)
 OUTPATIENT PHYSICAL THERAPY LOWER EXTREMITY EVALUATION   Patient Name: Rachel Crane MRN: [id number] DOB:1945-10-24, 78 y.o., female Today's Date: 02/11/2024  END OF SESSION:   Past Medical History:  Diagnosis Date   Abnormal stress ECG 10/06/2018   Aortic valve sclerosis 10/06/2018   Arthritis    Asthma    Bilateral carotid bruits 10/06/2018   Headache    H/O bad HA, since using CPAP- she has had improvement with that issue    High cholesterol    Hypertension    Laboratory examination 10/06/2018   OSA on CPAP    settting - 8, uses CPAP q night    Pre-diabetes    Shingles    Past Surgical History:  Procedure Laterality Date   CHOLECYSTECTOMY  1998   TOTAL KNEE ARTHROPLASTY Right 09/19/2017   Procedure: RIGHT TOTAL KNEE ARTHROPLASTY;  Surgeon: Jerri Kay HERO, MD;  Location: [facility] OR;  Service: Orthopedics;  Laterality: Right;   TOTAL KNEE ARTHROPLASTY Left 06/06/2020   Procedure: LEFT TOTAL KNEE ARTHROPLASTY;  Surgeon: Jerri Kay HERO, MD;  Location: [facility] OR;  Service: Orthopedics;  Laterality: Left;   TRANSFORAMINAL LUMBAR INTERBODY FUSION (TLIF) WITH PEDICLE SCREW FIXATION 1 LEVEL Left 05/02/2023   Procedure: LEFT-SIDED LUMBAR THREE- LUMBAR FOUR TRANSFORAMINAL LUMBAR INTERBODY FUSION AND DECOMPRESSION WITH INSTRUMENTATION AND ALLOGRAFT;  Surgeon: Beuford Anes, MD;  Location: [facility] OR;  Service: Orthopedics;  Laterality: Left;   TUBAL LIGATION     Patient Active Problem List   Diagnosis Date Noted   Radiculopathy, lumbar region 05/02/2023   Status post total right knee replacement 02/15/2022   Chronic low back pain 02/15/2022   Moderate persistent asthma without complication 11/03/2021   Gastroesophageal reflux disease with esophagitis 11/03/2021   Status post total left knee replacement 06/06/2020   Positive ANA (antinuclear antibody) 04/25/2020   Primary osteoarthritis of left knee 04/14/2020   Lumbar radiculopathy 06/26/2019   Left-sided low back pain with left-sided sciatica  06/26/2019   Thoracic spinal stenosis 04/18/2019   Seasonal allergies 12/04/2018   Seasonal and perennial allergic rhinoconjunctivitis 10/10/2018   Moderate persistent asthma with acute exacerbation 10/10/2018   Pruritus 10/10/2018   Aortic valve sclerosis 10/06/2018   Bilateral carotid bruits 10/06/2018   Abnormal stress ECG 10/06/2018   Laboratory examination 10/06/2018   Palpitations 10/06/2018   Total knee replacement status 09/19/2017   Sprain of anterior talofibular ligament of right ankle 12/13/2016    PCP: Elliot Charm, MD  REFERRING PROVIDER: Jerri Kay HERO, MD  REFERRING DIAG:  [phone] (ICD-10-CM) - Status post total left knee replacement  Z96.651 (ICD-10-CM) - Status post total right knee replacement    THERAPY DIAG:  No diagnosis found.  Rationale for Evaluation and Treatment: Rehabilitation  ONSET DATE:  L TKA: 06/06/2020 R TKA: 09/19/2017  SUBJECTIVE:   SUBJECTIVE STATEMENT: ***  PERTINENT HISTORY: Arthritis, asthma, hypertension, pre-diabetes, thoracic spinal stenosis, lumbar radiculopathy, Lt sided LBP,   DIAGNOSTIC FINDINGS: ***  PAIN:  NPRS scale: ***/10 Pain location: *** Pain description: *** Aggravating factors: *** Relieving factors: ***  PRECAUTIONS: {Therapy precautions:24002}  WEIGHT BEARING RESTRICTIONS: {Yes ***/No:24003}  FALLS:  Has patient fallen in last 6 months? {fallsyesno:27318}  LIVING ENVIRONMENT: Lives with: {OPRC lives with:25569::lives with their family} Lives in: {Lives in:25570} Stairs: {opstairs:27293} Has following equipment at home: {Assistive devices:23999}  OCCUPATION: ***  PLOF: Independent  PATIENT GOALS: ***  Next MD visit:  OBJECTIVE:   PATIENT SURVEYS:  Patient-Specific Activity Scoring Scheme  0 represents "unable to perform." 10 represents "able to  perform at prior level. 0 1 2 3 4 5 6 7 8 9  10 (Date and Score)  Activity Eval     1. ***      2. ***      3. ***    4.     5.    Score ***    Total score = sum of the activity scores/number of activities Minimum detectable change (90%CI) for average score = 2 points Minimum detectable change (90%CI) for single activity score = 3 points  COGNITION: Overall cognitive status: WFL    SENSATION: {sensation:27233}  EDEMA:  {edema:24020}  MUSCLE LENGTH: Hamstrings: Right *** deg; Left *** deg Thomas test: Right *** deg; Left *** deg  POSTURE:  {posture:25561}  PALPATION: ***  LOWER EXTREMITY ROM:   ROM Right eval Left eval  Hip flexion    Hip extension    Hip abduction    Hip adduction    Hip internal rotation    Hip external rotation    Knee flexion    Knee extension    Ankle dorsiflexion    Ankle plantarflexion    Ankle inversion    Ankle eversion     (Blank rows = not tested)  LOWER EXTREMITY MMT:  MMT Right eval Left eval  Hip flexion    Hip extension    Hip abduction    Hip adduction    Hip internal rotation    Hip external rotation    Knee flexion    Knee extension    Ankle dorsiflexion    Ankle plantarflexion    Ankle inversion    Ankle eversion     (Blank rows = not tested)  LOWER EXTREMITY SPECIAL TESTS:  {LEspecialtests:26242}  FUNCTIONAL TESTS:  18 inch chair transfer: Lt SLS: Rt SLS:  GAIT: Distance walked: *** Assistive device utilized: {Assistive devices:23999} Level of assistance: {Levels of assistance:24026} Comments: ***                                                                                                                                                                        TODAY'S TREATMENT                                                                          DATE: *** Therapeutic Exercise: HEP instruction/performance c cues for techniques, handout provided.  Trial set performed of each for comprehension and symptom assessment.  See below for exercise list  PATIENT EDUCATION:  Education details: HEP, POC Person educated:  Patient Education method: Programmer, multimedia, Demonstration, Verbal cues, and Handouts Education comprehension: verbalized understanding, returned demonstration, and verbal cues required  HOME EXERCISE PROGRAM: ***  ASSESSMENT:  CLINICAL IMPRESSION: Patient is a *** y.o. who comes to clinic with complaints of ***pain with mobility, strength and movement coordination deficits that impair their ability to perform usual daily and recreational functional activities without increase difficulty/symptoms at this time.  Patient to benefit from skilled PT services to address impairments and limitations to improve to previous level of function without restriction secondary to condition.   OBJECTIVE IMPAIRMENTS: {opptimpairments:25111}.   ACTIVITY LIMITATIONS: {activitylimitations:27494}  PARTICIPATION LIMITATIONS: {participationrestrictions:25113}  PERSONAL FACTORS: {Personal factors:25162} are also affecting patient's functional outcome.   REHAB POTENTIAL: {rehabpotential:25112}  CLINICAL DECISION MAKING: {clinical decision making:25114}  EVALUATION COMPLEXITY: {Evaluation complexity:25115}   GOALS: Goals reviewed with patient? Yes  SHORT TERM GOALS: (target date for Short term goals ***)   1.  Patient will demonstrate independent use of home exercise program to maintain progress from in clinic treatments.  Goal status: New  LONG TERM GOALS: (target dates for all long term goals  *** )   1. Patient will demonstrate/report pain at worst less than or equal to 2/10 to facilitate minimal limitation in daily activity secondary to pain symptoms. Goal status: New   2. Patient will demonstrate independent use of home exercise program to facilitate ability to maintain/progress functional gains from skilled physical therapy services. Goal status: New   3. Patient will demonstrate Patient specific functional scale avg > or = *** to indicate reduced disability due to condition.  Goal status: New    4.  Patient will demonstrate *** LE MMT 5/5 throughout to faciltiate usual transfers, stairs, squatting at [facility] for daily life.  Goal status: New   5.  Patient will demonstrate Goal status: New   6.  *** Goal status: New   7.  *** Goal Status: New   PLAN:  PT FREQUENCY: {rehab frequency:25116}  PT DURATION: {rehab duration:25117}  PLANNED INTERVENTIONS: {rehab planned interventions:25118::97110-Therapeutic exercises,97530- Therapeutic [phone]- Neuromuscular re-education,97535- Self Rjmz,02859- Manual therapy}  PLAN FOR NEXT SESSION: ***   Ismael Nap, Student-PT 02/11/2024, 7:52 AM

## 2024-02-12 DIAGNOSIS — L299 Pruritus, unspecified: Secondary | ICD-10-CM | POA: Diagnosis not present

## 2024-02-12 DIAGNOSIS — I10 Essential (primary) hypertension: Secondary | ICD-10-CM | POA: Diagnosis not present

## 2024-02-15 ENCOUNTER — Ambulatory Visit
Admission: RE | Admit: 2024-02-15 | Discharge: 2024-02-15 | Disposition: A | Discharge status: Home / Self Care | Source: Ambulatory Visit | Attending: Orthopedic Surgery | Admitting: Orthopedic Surgery

## 2024-02-15 DIAGNOSIS — M48061 Spinal stenosis, lumbar region without neurogenic claudication: Secondary | ICD-10-CM | POA: Diagnosis not present

## 2024-02-15 DIAGNOSIS — M5126 Other intervertebral disc displacement, lumbar region: Secondary | ICD-10-CM | POA: Diagnosis not present

## 2024-02-15 DIAGNOSIS — M47816 Spondylosis without myelopathy or radiculopathy, lumbar region: Secondary | ICD-10-CM | POA: Diagnosis not present

## 2024-02-15 DIAGNOSIS — M545 Low back pain, unspecified: Secondary | ICD-10-CM

## 2024-02-17 DIAGNOSIS — M545 Low back pain, unspecified: Secondary | ICD-10-CM | POA: Diagnosis not present

## 2024-02-17 DIAGNOSIS — M4326 Fusion of spine, lumbar region: Secondary | ICD-10-CM | POA: Diagnosis not present

## 2024-02-17 DIAGNOSIS — M6281 Muscle weakness (generalized): Secondary | ICD-10-CM | POA: Diagnosis not present

## 2024-02-24 DIAGNOSIS — M545 Low back pain, unspecified: Secondary | ICD-10-CM | POA: Diagnosis not present

## 2024-03-05 DIAGNOSIS — M961 Postlaminectomy syndrome, not elsewhere classified: Secondary | ICD-10-CM | POA: Diagnosis not present

## 2024-03-05 DIAGNOSIS — M545 Low back pain, unspecified: Secondary | ICD-10-CM | POA: Diagnosis not present

## 2024-03-05 DIAGNOSIS — M79605 Pain in left leg: Secondary | ICD-10-CM | POA: Diagnosis not present

## 2024-03-05 DIAGNOSIS — M791 Myalgia, unspecified site: Secondary | ICD-10-CM | POA: Diagnosis not present

## 2024-03-05 DIAGNOSIS — G8929 Other chronic pain: Secondary | ICD-10-CM | POA: Diagnosis not present

## 2024-03-10 DIAGNOSIS — M25571 Pain in right ankle and joints of right foot: Secondary | ICD-10-CM | POA: Diagnosis not present

## 2024-03-10 DIAGNOSIS — M5416 Radiculopathy, lumbar region: Secondary | ICD-10-CM | POA: Diagnosis not present

## 2024-03-10 DIAGNOSIS — M5127 Other intervertebral disc displacement, lumbosacral region: Secondary | ICD-10-CM | POA: Diagnosis not present

## 2024-03-11 ENCOUNTER — Other Ambulatory Visit: Payer: Self-pay | Admitting: Internal Medicine

## 2024-03-11 ENCOUNTER — Ambulatory Visit
Admission: RE | Admit: 2024-03-11 | Discharge: 2024-03-11 | Disposition: A | Discharge status: Home / Self Care | Source: Ambulatory Visit | Attending: Internal Medicine | Admitting: Internal Medicine

## 2024-03-11 DIAGNOSIS — M25571 Pain in right ankle and joints of right foot: Secondary | ICD-10-CM

## 2024-03-13 ENCOUNTER — Emergency Department (HOSPITAL_COMMUNITY)

## 2024-03-13 ENCOUNTER — Emergency Department (HOSPITAL_COMMUNITY)
Admission: EM | Admit: 2024-03-13 | Discharge: 2024-03-13 | Disposition: A | Discharge status: Home / Self Care | Attending: Emergency Medicine | Admitting: Emergency Medicine

## 2024-03-13 ENCOUNTER — Encounter (HOSPITAL_COMMUNITY): Payer: Self-pay

## 2024-03-13 ENCOUNTER — Other Ambulatory Visit: Payer: Self-pay

## 2024-03-13 DIAGNOSIS — R42 Dizziness and giddiness: Secondary | ICD-10-CM

## 2024-03-13 DIAGNOSIS — H9313 Tinnitus, bilateral: Secondary | ICD-10-CM | POA: Diagnosis not present

## 2024-03-13 DIAGNOSIS — R531 Weakness: Secondary | ICD-10-CM | POA: Diagnosis not present

## 2024-03-13 DIAGNOSIS — M549 Dorsalgia, unspecified: Secondary | ICD-10-CM | POA: Diagnosis not present

## 2024-03-13 DIAGNOSIS — I10 Essential (primary) hypertension: Secondary | ICD-10-CM | POA: Insufficient documentation

## 2024-03-13 DIAGNOSIS — I6529 Occlusion and stenosis of unspecified carotid artery: Secondary | ICD-10-CM | POA: Diagnosis not present

## 2024-03-13 LAB — BASIC METABOLIC PANEL WITH GFR
Anion gap: 13 (ref 5–15)
BUN: 10 mg/dL (ref 8–23)
CO2: 25 mmol/L (ref 22–32)
Calcium: 9.8 mg/dL (ref 8.9–10.3)
Chloride: 99 mmol/L (ref 98–111)
Creatinine, Ser: 0.82 mg/dL (ref 0.44–1.00)
GFR, Estimated: >60 mL/min (ref >60)
Glucose, Bld: 99 mg/dL (ref 70–99)
Potassium: 3.8 mmol/L (ref 3.5–5.1)
Sodium: 137 mmol/L (ref 135–145)

## 2024-03-13 LAB — CBC WITH DIFFERENTIAL/PLATELET
Abs Immature Granulocytes: 0.04 K/uL (ref 0.00–0.07)
Basophils Absolute: 0 K/uL (ref 0.0–0.1)
Basophils Relative: 0 %
Eosinophils Absolute: 0.1 K/uL (ref 0.0–0.5)
Eosinophils Relative: 1 %
HCT: 37 % (ref 36.0–46.0)
Hemoglobin: 11.9 g/dL — ABNORMAL LOW (ref 12.0–15.0)
Immature Granulocytes: 1 %
Lymphocytes Relative: 29 %
Lymphs Abs: 2.5 K/uL (ref 0.7–4.0)
MCH: 27 pg (ref 26.0–34.0)
MCHC: 32.2 g/dL (ref 30.0–36.0)
MCV: 83.9 fL (ref 80.0–100.0)
Monocytes Absolute: 0.4 K/uL (ref 0.1–1.0)
Monocytes Relative: 5 %
Neutro Abs: 5.5 K/uL (ref 1.7–7.7)
Neutrophils Relative %: 64 %
Platelets: 223 K/uL (ref 150–400)
RBC: 4.41 MIL/uL (ref 3.87–5.11)
RDW: 14.6 % (ref 11.5–15.5)
WBC: 8.6 K/uL (ref 4.0–10.5)
nRBC: 0 % (ref 0.0–0.2)

## 2024-03-13 MED ORDER — MECLIZINE HCL 12.5 MG PO TABS: 12.5000 mg | ORAL_TABLET | Freq: Three times a day (TID) | ORAL | 0 refills | Status: AC | PRN

## 2024-03-13 MED ORDER — SCOPOLAMINE 1 MG/3DAYS TD PT72: 1.0000 | MEDICATED_PATCH | TRANSDERMAL | 12 refills | Status: AC

## 2024-03-13 MED ORDER — MECLIZINE HCL 25 MG PO TABS
12.5000 mg | ORAL_TABLET | Freq: Once | ORAL | Status: AC
  Administered 2024-03-13: 12.5 mg via ORAL
  Filled 2024-03-13: qty 1

## 2024-03-13 MED ORDER — IOHEXOL 350 MG/ML SOLN
75.0000 mL | Freq: Once | INTRAVENOUS | Status: AC | PRN
  Administered 2024-03-13: 75 mL via INTRAVENOUS

## 2024-03-13 MED ORDER — SCOPOLAMINE 1 MG/3DAYS TD PT72
1.0000 | MEDICATED_PATCH | TRANSDERMAL | Status: DC
  Administered 2024-03-13: 1 mg via TRANSDERMAL
  Filled 2024-03-13: qty 1

## 2024-03-13 MED ORDER — GABAPENTIN 300 MG PO CAPS
600.0000 mg | ORAL_CAPSULE | ORAL | Status: AC
  Administered 2024-03-13: 600 mg via ORAL
  Filled 2024-03-13: qty 2

## 2024-03-13 MED ORDER — MECLIZINE HCL 25 MG PO TABS: 25.0000 mg | ORAL_TABLET | Freq: Once | ORAL | Status: DC

## 2024-03-13 MED ORDER — DIAZEPAM 5 MG/ML IJ SOLN
2.5000 mg | Freq: Once | INTRAMUSCULAR | Status: AC
  Administered 2024-03-13: 2.5 mg via INTRAVENOUS
  Filled 2024-03-13: qty 2

## 2024-03-13 MED ORDER — ACETAMINOPHEN 500 MG PO TABS
1000.0000 mg | ORAL_TABLET | ORAL | Status: AC
  Administered 2024-03-13: 1000 mg via ORAL
  Filled 2024-03-13: qty 2

## 2024-03-13 NOTE — ED Triage Notes (Signed)
 Patient BIB EMS from home c/o dizziness. Patient does have a history of vertigo, but feels that this is not the same feeling. Patient recently started taking gabapentin  again. Patient took the gabapentin  and tylenol  around 5-6 yesterday in the afternoon and then took her muscle relaxer before bed at about 10 last night. Patient woke up around 4 this morning dizzy when she went to the bathroom and took a dizzy pill.

## 2024-03-13 NOTE — Discharge Instructions (Signed)
 You were seen for your vertigo in the emergency department.  It is likely from a problem of your inner ear.  At home, please use the scopolamine  patches and the meclizine  we have prescribed you for your dizziness.    Check your MyChart online for the results of any tests that had not resulted by the time you left the emergency department.   Follow-up with your primary doctor in 2-3 days regarding your visit.  Follow-up with the ear nose and throat doctors about this.  Return immediately to the emergency department if you experience any of the following: Worsening dizziness, falls, or any other concerning symptoms.    Thank you for visiting our Emergency Department. It was a pleasure taking care of you today.

## 2024-03-13 NOTE — ED Provider Notes (Signed)
 [place] AT [facility] Provider Note   CSN: [id number] Arrival date & time: 03/13/24  1035     Patient presents with: Dizziness   Rachel Crane is a 78 y.o. female.   78 year old female with a history of vertigo, hypertension, hyperlipidemia, and aortic valve sclerosis who presents to the emergency department with vertigo.  Went to bed last night at 9 PM and felt normal.  Woke up in the middle of the night to go to the bathroom and felt very dizzy.  Tried taking a meclizine  and went back to sleep but when she woke up she still felt as though the room was spinning.  Over the past 2 weeks has had ear ringing out of both of her ears.  No hearing loss.  Says she is having to hold onto things to walk now       Prior to Admission medications   Medication Sig Start Date End Date Taking? Authorizing Provider  meclizine  (ANTIVERT ) 12.5 MG tablet Take 1 tablet (12.5 mg total) by mouth 3 (three) times daily as needed for dizziness. 03/13/24  Yes Yolande Lamar BROCKS, MD  scopolamine  (TRANSDERM-SCOP) 1 MG/3DAYS Place 1 patch (1 mg total) onto the skin every 3 (three) days. 03/13/24  Yes Yolande Lamar BROCKS, MD  famotidine  (PEPCID ) 20 MG tablet Take 1 tablet (20 mg total) by mouth 2 (two) times daily. 12/25/23   Jeneal Danita Macintosh, MD  fexofenadine [facility] ALLERGY) 180 MG tablet Take 180 mg by mouth daily as needed for allergies.    [provider]  fluticasone  (FLONASE ) 50 MCG/ACT nasal spray Place 1 spray into both nostrils daily as needed (nasal congestion). 11/29/22   Crain, Whitney L, PA  gabapentin  (NEURONTIN ) 300 MG capsule Take 2 capsules (600 mg total) by mouth 3 (three) times daily. Patient taking differently: Take 300 mg by mouth 3 (three) times daily. 11/05/22 09/27/23  Georgina Ozell LABOR, MD  hydrochlorothiazide  (HYDRODIURIL ) 25 MG tablet Take 12.5-25 mg by mouth daily. 11/02/23   [provider]  latanoprost  (XALATAN ) 0.005 % ophthalmic  solution Place 1 drop into both eyes at bedtime. 12/31/22   [provider]  levocetirizine (XYZAL ) 5 MG tablet Take 1 tablet (5 mg total) by mouth 2 (two) times daily. 12/25/23   Jeneal Danita Macintosh, MD  montelukast  (SINGULAIR ) 10 MG tablet Take 1 tablet (10 mg total) by mouth at bedtime. 12/25/23   Jeneal Danita Macintosh, MD  Multiple Vitamin (MULTIVITAMIN) tablet Take 1 tablet by mouth daily.    [provider]  tiZANidine  (ZANAFLEX ) 4 MG tablet Take 4 mg by mouth every 6 (six) hours as needed. 11/02/23   [provider]  traMADol  (ULTRAM ) 50 MG tablet Take 1 tablet (50 mg total) by mouth every 6 (six) hours as needed for moderate pain (pain score 4-6). 05/03/23   Beuford Anes, MD  triamcinolone  ointment (KENALOG ) 0.1 % Apply 1 Application topically 2 (two) times daily as needed (rash). 12/25/23   Jeneal Danita Macintosh, MD  triamcinolone  ointment (KENALOG ) 0.1 % Apply 1 Application topically 2 (two) times daily as needed. 12/25/23   Jeneal Danita Macintosh, MD    Allergies: Patient has no known allergies.    Review of Systems  Updated Vital Signs BP (!) 170/78 (BP Location: Right Arm)   Pulse 80   Temp 98.6 F (37 C) (Oral)   Resp 19   SpO2 98%   Physical Exam Vitals and nursing note reviewed.  Constitutional:  General: She is not in acute distress.    Appearance: She is well-developed.  HENT:     Head: Normocephalic and atraumatic.     Right Ear: Ear canal and external ear normal.     Left Ear: Ear canal and external ear normal.     Ears:     Comments: Impacted cerumen obscures view of TM bilateral    Nose: Nose normal.  Eyes:     Extraocular Movements: Extraocular movements intact.     Conjunctiva/sclera: Conjunctivae normal.     Pupils: Pupils are equal, round, and reactive to light.  Cardiovascular:     Rate and Rhythm: Normal rate and regular rhythm.     Heart sounds: No murmur heard. Pulmonary:     Effort: Pulmonary effort  is normal. No respiratory distress.     Breath sounds: Normal breath sounds.  Musculoskeletal:     Cervical back: Normal range of motion and neck supple.     Right lower leg: No edema.     Left lower leg: No edema.  Skin:    General: Skin is warm and dry.  Neurological:     Mental Status: She is alert and oriented to person, place, and time. Mental status is at baseline.     Comments: NIHSS Exam  Level of Consciousness: Alert  LOC Questions: Answers Month and Age Correctly  LOC Commands: Opens and Closes Eyes and Hands on command  Best Gaze: Horizontal ocular movements intact  Visual Fields: No visual field loss  Facial Palsy: None  L Upper Extremity Motor: No drift after 10 seconds  R Upper Extremity Motor: No drift after 10 seconds  L Lower extremity Motor: No drift after 5 seconds  R Lower extremity Motor: No drift after 5 seconds  Ataxia: Absent  Sensory: Intact sensation to light touch on face, arms, trunk, and legs bilaterally  Best Language: No aphasia  Dysarthria: No dysarthria  Neglect: No visual or sensory neglect   Due to chronic back pain unable to perform a full Dix-Hallpike.  Was able to go 45 degrees and this did not elicit any dizziness or abnormal nystagmus bilaterally.   Psychiatric:        Mood and Affect: Mood normal.     (all labs ordered are listed, but only abnormal results are displayed) Labs Reviewed  CBC WITH DIFFERENTIAL/PLATELET - Abnormal; Notable for the following components:      Result Value   Hemoglobin 11.9 (*)    All other components within normal limits  BASIC METABOLIC PANEL WITH GFR    EKG: EKG Interpretation Date/Time:  Friday March 13 2024 11:39:43 EDT Ventricular Rate:  57 PR Interval:  182 QRS Duration:  88 QT Interval:  428 QTC Calculation: 417 R Axis:   59  Text Interpretation: Sinus rhythm Nonspecific T abnormalities, anterior leads Confirmed by Yolande Charleston [phone]) on 03/13/2024 12:10:16 PM  Radiology: CT ANGIO  HEAD NECK W WO CM Result Date: 03/13/2024 EXAM: CT HEAD WITHOUT CTA HEAD AND NECK WITH AND WITHOUT 03/13/2024 02:43:59 PM TECHNIQUE: CTA of the head and neck was performed with and without the administration of intravenous contrast. Noncontrast CT of the head with reconstructed 2-D images are also provided for review. Multiplanar 2D and/or 3D reformatted images are provided for review. Automated exposure control, iterative reconstruction, and/or weight based adjustment of the mA/kV was utilized to reduce the radiation dose to as low as reasonably achievable. COMPARISON: MRI brain 03/13/2024, head CT 09/25/2023 CLINICAL HISTORY: Patient with a  history of vertigo presents with a new episode of dizziness. FINDINGS: CT HEAD: BRAIN AND VENTRICLES: No acute intracranial hemorrhage. No mass effect. No midline shift. No extra-axial fluid collection. No evidence of acute infarct. No hydrocephalus. Stable background of mild chronic small vessel disease. ORBITS: No acute abnormality. SINUSES AND MASTOIDS: No acute abnormality. CTA NECK: AORTIC ARCH AND ARCH VESSELS: Two-vessel arch configuration with common origin of the left common carotid and right brachiocephalic arteries. No dissection or arterial injury. No significant stenosis of the brachiocephalic or subclavian arteries. CERVICAL CAROTID ARTERIES: No dissection, arterial injury, or hemodynamically significant stenosis by NASCET criteria. CERVICAL VERTEBRAL ARTERIES: No dissection, arterial injury, or significant stenosis. LUNGS AND MEDIASTINUM: Unremarkable. SOFT TISSUES: No acute abnormality. BONES: No acute abnormality. CTA HEAD: ANTERIOR CIRCULATION: Atherosclerotic calcifications of the ICA siphons without significant stenosis or aneurysm. No significant stenosis of the anterior cerebral arteries. No significant stenosis of the middle cerebral arteries. No aneurysm. POSTERIOR CIRCULATION: No significant stenosis of the posterior cerebral arteries. No significant  stenosis of the basilar artery. No significant stenosis of the vertebral arteries. No aneurysm. OTHER: No dural venous sinus thrombosis on this non-dedicated study. IMPRESSION: 1. No acute intracranial abnormality. 2. No large vessel occlusion, hemodynamically significant stenosis or aneurysm in the head and neck vessels. 3. Stable background of mild chronic small vessel disease. Electronically signed by: Ryan Chess MD 03/13/2024 02:55 PM EDT RP Workstation: [id number]   MR BRAIN WO CONTRAST Result Date: 03/13/2024 CLINICAL DATA:  vertigo EXAM: MRI HEAD WITHOUT CONTRAST TECHNIQUE: Multiplanar, multiecho pulse sequences of the brain and surrounding structures were obtained without intravenous contrast. COMPARISON:  Same day CTAHead/neck. FINDINGS: Brain: No acute infarction, hemorrhage, hydrocephalus, extra-axial collection or mass lesion. Mild for age T2/FLAIR hyperintensities in the white matter, compatible with chronic microvascular ischemic change. Vascular: Major arterial flow voids are maintained at the skull base. Skull and upper cervical spine: Normal marrow signal. Sinuses/Orbits: Clear sinuses.  No acute orbital findings. Other: No mastoid effusions. IMPRESSION: No evidence of acute intracranial abnormality. Electronically Signed   By: Gilmore GORMAN Molt M.D.   On: 03/13/2024 14:11     Procedures   Medications Ordered in the ED  scopolamine  (TRANSDERM-SCOP) 1 MG/3DAYS 1 mg (1 mg Transdermal Patch Applied 03/13/24 1505)  diazepam  (VALIUM ) injection 2.5 mg (2.5 mg Intravenous Given 03/13/24 1136)  iohexol  (OMNIPAQUE ) 350 MG/ML injection 75 mL (75 mLs Intravenous Contrast Given 03/13/24 1444)  gabapentin  (NEURONTIN ) capsule 600 mg (600 mg Oral Given 03/13/24 1411)  acetaminophen  (TYLENOL ) tablet 1,000 mg (1,000 mg Oral Given 03/13/24 1411)  meclizine  (ANTIVERT ) tablet 12.5 mg (12.5 mg Oral Given 03/13/24 1520)                                    Medical Decision Making Amount and/or  Complexity of Data Reviewed Labs: ordered. Radiology: ordered.  Risk OTC drugs. Prescription drug management.   78 year old female with a history of vertigo, hypertension, hyperlipidemia, and aortic valve sclerosis who presents to the emergency department with vertigo.   Initial Ddx:  Stroke, schwannoma, ICH, vestibular neuritis, labyrinthitis, BPPV  MDM/Course:  Patient presents emergency department with vertigo.  Has had some ringing of the ears for the past 2 weeks as well.  At this point in time she is unable to walk without assistance.  No other focal neurologic deficits on exam.  Unable to perform full Dix-Hallpike because of her chronic back pain.  With her  risk factors did undergo a CTA of the head and neck and MRI.  No evidence of stroke.  Was given medications for her vertigo and upon re-evaluation was able to ambulate without assistance.  Will follow-up with ENT and her primary doctor as an outpatient since it appears that she is likely having a form of peripheral vertigo.  This patient presents to the ED for concern of complaints listed in HPI, this involves an extensive number of treatment options, and is a complaint that carries with it a high risk of complications and morbidity. Disposition including potential need for admission considered.   Dispo: DC Home. Return precautions discussed including, but not limited to, those listed in the AVS. Allowed pt time to ask questions which were answered fully prior to dc.  Records reviewed Outpatient Clinic Notes The following labs were independently interpreted: Chemistry and show no acute abnormality I independently reviewed the following imaging with scope of interpretation limited to determining acute life threatening conditions related to emergency care: CT Head and agree with the radiologist interpretation with the following exceptions: none I personally reviewed and interpreted cardiac monitoring: normal sinus rhythm  I  personally reviewed and interpreted the pt's EKG: see above for interpretation  I have reviewed the patients home medications and made adjustments as needed Social Determinants of health:  Geriatric  Portions of this note were generated with Scientist, clinical (histocompatibility and immunogenetics). Dictation errors may occur despite best attempts at proofreading.     Final diagnoses:  Vertigo  Tinnitus of both ears    ED Discharge Orders          Ordered    scopolamine  (TRANSDERM-SCOP) 1 MG/3DAYS  every 72 hours        03/13/24 1620    meclizine  (ANTIVERT ) 12.5 MG tablet  3 times daily PRN        03/13/24 1620               Yolande Lamar BROCKS, MD 03/13/24 1718

## 2024-03-13 NOTE — ED Notes (Signed)
 Patient ambulated with this RN. Patient walked with minimal assistance, however did have times where she would appear to stumble. MD notified.

## 2024-03-17 DIAGNOSIS — Z1331 Encounter for screening for depression: Secondary | ICD-10-CM | POA: Diagnosis not present

## 2024-03-17 DIAGNOSIS — G4733 Obstructive sleep apnea (adult) (pediatric): Secondary | ICD-10-CM | POA: Diagnosis not present

## 2024-03-17 DIAGNOSIS — E7800 Pure hypercholesterolemia, unspecified: Secondary | ICD-10-CM | POA: Diagnosis not present

## 2024-03-17 DIAGNOSIS — N182 Chronic kidney disease, stage 2 (mild): Secondary | ICD-10-CM | POA: Diagnosis not present

## 2024-03-17 DIAGNOSIS — M25571 Pain in right ankle and joints of right foot: Secondary | ICD-10-CM | POA: Diagnosis not present

## 2024-03-17 DIAGNOSIS — Z0000 Encounter for general adult medical examination without abnormal findings: Secondary | ICD-10-CM | POA: Diagnosis not present

## 2024-03-17 DIAGNOSIS — G894 Chronic pain syndrome: Secondary | ICD-10-CM | POA: Diagnosis not present

## 2024-03-17 DIAGNOSIS — I10 Essential (primary) hypertension: Secondary | ICD-10-CM | POA: Diagnosis not present

## 2024-03-17 DIAGNOSIS — R42 Dizziness and giddiness: Secondary | ICD-10-CM | POA: Diagnosis not present

## 2024-03-17 DIAGNOSIS — M5116 Intervertebral disc disorders with radiculopathy, lumbar region: Secondary | ICD-10-CM | POA: Diagnosis not present

## 2024-03-17 DIAGNOSIS — J45909 Unspecified asthma, uncomplicated: Secondary | ICD-10-CM | POA: Diagnosis not present

## 2024-03-17 DIAGNOSIS — E1122 Type 2 diabetes mellitus with diabetic chronic kidney disease: Secondary | ICD-10-CM | POA: Diagnosis not present

## 2024-04-01 ENCOUNTER — Encounter: Payer: Self-pay | Admitting: Podiatry

## 2024-04-01 ENCOUNTER — Ambulatory Visit: Admitting: Podiatry

## 2024-04-01 ENCOUNTER — Ambulatory Visit (INDEPENDENT_AMBULATORY_CARE_PROVIDER_SITE_OTHER)

## 2024-04-01 DIAGNOSIS — M722 Plantar fascial fibromatosis: Secondary | ICD-10-CM

## 2024-04-01 DIAGNOSIS — M7731 Calcaneal spur, right foot: Secondary | ICD-10-CM | POA: Diagnosis not present

## 2024-04-01 DIAGNOSIS — G5751 Tarsal tunnel syndrome, right lower limb: Secondary | ICD-10-CM | POA: Diagnosis not present

## 2024-04-01 DIAGNOSIS — M65971 Unspecified synovitis and tenosynovitis, right ankle and foot: Secondary | ICD-10-CM | POA: Diagnosis not present

## 2024-04-01 MED ORDER — TRIAMCINOLONE ACETONIDE 10 MG/ML IJ SUSP
10.0000 mg | Freq: Once | INTRAMUSCULAR | Status: AC
  Administered 2024-04-01: 10 mg

## 2024-04-01 NOTE — Progress Notes (Signed)
 Patient presents for with complaint of pain along the lateral aspect of the foot and ankle right.  Has been bothering her for several weeks now.  Does not recall any specific injury to it.  Has not noticed any redness or ecchymosis.  Pain along the arch of the foot also.   Physical exam:  General appearance: Pleasant, and in no acute distress. AOx3.  Vascular: Pedal pulses: DP 2/4 bilaterally, PT 2/4 bilaterally.  Mild edema lower legs bilaterally. Capillary fill time immediate bilaterally.  Neurological: Light touch intact feet bilaterally.  Normal Achilles reflex bilaterally.  No clonus or spasticity noted.  Negative Tinel's sign tarsal tunnel porta pedis bilaterally  Dermatologic:   Skin normal temperature bilaterally.  Skin normal color, tone, and texture bilaterally.   Musculoskeletal: Tenderness along the peroneal tendons from the retromalleolar area to the insertion at the base of the fifth metatarsal and especially at the peroneal calcaneal tubercle.  Tenderness on the plantar lateral calcaneal tubercle right.  Tenderness in the sinus tarsi pain with range of motion subtalar joint right.  Normal muscle strength lower extremity bilaterally tenderness along the peroneal tendons with eversion against resistance right  Radiographs: 3 views foot right: Osteophytic changes plantar and posterior aspect calcaneus.  Some osteophytic changes at the base of the fifth metatarsal.  No signs of any fractures or dislocations.  Normal bone density.  No evidence of bone tumors.  Diagnosis: 1.  Plantar fasciitis right  2.  Tenosynovitis peroneal tendons right 3.  Calcaneal spur right  Plan: -New patient office visit for evaluation and management level 3.  Modifier 25. -Discussed with the pain she is having around peroneal tendons plantar fasciitis and STJ on the right.  Most of the pain seems to be along the peroneal tendons.  Will inject there today.  Also recommended RICE.  Wear good stable  supportive shoes. -Dispensed AFO right. - Dispensed OTC foot orthotic bilaterally -injected 3cc 2:1 mixture 0.5 cc Marcaine :Kenolog 10mg /17ml peroneus brevis tendon sheath from peroneal tubercle to the distal aspect of the fibula..    Return 2 weeks follow-up injection along peroneal tendons right

## 2024-04-08 NOTE — Discharge Instructions (Signed)

## 2024-04-09 ENCOUNTER — Ambulatory Visit
Admission: RE | Admit: 2024-04-09 | Discharge: 2024-04-09 | Disposition: A | Discharge status: Home / Self Care | Source: Ambulatory Visit | Attending: Physical Medicine and Rehabilitation | Admitting: Physical Medicine and Rehabilitation

## 2024-04-09 DIAGNOSIS — M545 Low back pain, unspecified: Secondary | ICD-10-CM

## 2024-04-09 MED ORDER — METHYLPREDNISOLONE ACETATE 40 MG/ML INJ SUSP (RADIOLOG
120.0000 mg | Freq: Once | INTRAMUSCULAR | Status: AC
  Administered 2024-04-09: 120 mg via INTRA_ARTICULAR

## 2024-04-09 MED ORDER — IOPAMIDOL (ISOVUE-M 200) INJECTION 41%
1.0000 mL | Freq: Once | INTRAMUSCULAR | Status: AC
  Administered 2024-04-09: 1 mL via INTRA_ARTICULAR

## 2024-04-13 ENCOUNTER — Ambulatory Visit: Admitting: Orthopedic Surgery

## 2024-04-16 ENCOUNTER — Encounter: Payer: Self-pay | Admitting: Podiatry

## 2024-04-16 ENCOUNTER — Ambulatory Visit: Admitting: Podiatry

## 2024-04-16 DIAGNOSIS — M25571 Pain in right ankle and joints of right foot: Secondary | ICD-10-CM | POA: Diagnosis not present

## 2024-04-16 MED ORDER — TRIAMCINOLONE ACETONIDE 10 MG/ML IJ SUSP
10.0000 mg | Freq: Once | INTRAMUSCULAR | Status: AC
  Administered 2024-04-16: 10 mg

## 2024-04-16 NOTE — Progress Notes (Signed)
 Patient presents follow-up injection along peroneal tendon sheath right.  Doing about 60% better overall.  Indicates that the pain is more localized to over the sinus tarsi and to the dorsal aspect of the foot.  No pain at the heel today.   Physical exam:  General appearance: Pleasant, and in no acute distress. AOx3.  Vascular: Pedal pulses: DP 2/4 bilaterally, PT 2/4 bilaterally.  Mild edema lower legs bilaterally.   Neurological: Thank Tinel sign tarsal tunnel and porta pedis right  Dermatologic:   Skin normal temperature bilaterally.  Skin normal color, tone, and texture bilaterally.   Musculoskeletal: Tenderness in the sinus tarsi and with range of motion subtalar joint right.  No tenderness today along peroneal tendons with +5 or +5 muscle strength in eversion..    Diagnosis: 1.  Arthralgia subtalar joint right  Plan: -Patient improved.  Still having pain at the subtalar joint and sinus tarsi right.  Peroneal tendinitis and a plantar fasciitis.  It resolved.  She does complain of some knee pain I said this could be I told her this could be from compensating for pain in the foot. -injected 3cc 2:1 mixture 0.5 cc Marcaine :Kenolog 10mg /36ml at subtalar joint sinus tarsi right.    Return 3 weeks follow-up injection STJ right

## 2024-05-07 ENCOUNTER — Ambulatory Visit: Admitting: Podiatry

## 2024-05-18 ENCOUNTER — Encounter: Payer: Self-pay | Admitting: Radiology

## 2024-05-19 DIAGNOSIS — M5417 Radiculopathy, lumbosacral region: Secondary | ICD-10-CM | POA: Diagnosis not present

## 2024-05-20 ENCOUNTER — Ambulatory Visit: Admitting: Podiatry

## 2024-05-20 DIAGNOSIS — M25571 Pain in right ankle and joints of right foot: Secondary | ICD-10-CM

## 2024-05-20 MED ORDER — TRIAMCINOLONE ACETONIDE 40 MG/ML IJ SUSP
40.0000 mg | Freq: Once | INTRAMUSCULAR | Status: AC
  Administered 2024-05-20: 40 mg

## 2024-05-20 NOTE — Progress Notes (Signed)
 Patient presents for follow-up of the tenderness at the dorsal lateral foot and subtalar joint right.  Doing a little bit better after the first injection at STJ 2 weeks ago.   Physical exam:  General appearance: Pleasant, and in no acute distress. AOx3.  Vascular: Pedal pulses: DP 2/4 bilaterally, PT 2/4 bilaterally.  Decreased at area of sinus tarsi right.. Capillary fill time immediate bilaterally.  Neurological: Grossly intact bilaterally.  Thank Tinel sign with at the dorsal cutaneous nerves right foot.  Dermatologic:   Skin normal temperature bilaterally.  Skin normal color, tone, and texture bilaterally.   Musculoskeletal: There is palpation of the sinus tarsi and along the subtalar joint.  Tenderness to range of motion subtalar joint right.    Diagnosis: 1.  Arthralgia subtalar joint right  Plan: -Doing send somewhat better.  Will try second injection at the STJ right today.  Recommended icing 20 minutes several times a day. -injected 3cc 2:1 mixture 0.5 cc Marcaine :Kenolog 10mg /53ml at subtalar joint right at sinus tarsi.    Return 2 weeks follow-up injection right

## 2024-05-25 DIAGNOSIS — Z23 Encounter for immunization: Secondary | ICD-10-CM | POA: Diagnosis not present

## 2024-06-03 ENCOUNTER — Ambulatory Visit: Admitting: Podiatry

## 2024-06-17 ENCOUNTER — Other Ambulatory Visit: Payer: Self-pay | Admitting: Internal Medicine

## 2024-06-17 DIAGNOSIS — Z1231 Encounter for screening mammogram for malignant neoplasm of breast: Secondary | ICD-10-CM

## 2024-06-23 ENCOUNTER — Inpatient Hospital Stay: Admission: RE | Admit: 2024-06-23 | Discharge: 2024-06-23 | Attending: Internal Medicine | Admitting: Internal Medicine

## 2024-06-23 DIAGNOSIS — R5383 Other fatigue: Secondary | ICD-10-CM | POA: Diagnosis not present

## 2024-06-23 DIAGNOSIS — L299 Pruritus, unspecified: Secondary | ICD-10-CM | POA: Diagnosis not present

## 2024-06-23 DIAGNOSIS — G4733 Obstructive sleep apnea (adult) (pediatric): Secondary | ICD-10-CM | POA: Diagnosis not present

## 2024-06-23 DIAGNOSIS — Z1231 Encounter for screening mammogram for malignant neoplasm of breast: Secondary | ICD-10-CM

## 2024-06-23 DIAGNOSIS — E1169 Type 2 diabetes mellitus with other specified complication: Secondary | ICD-10-CM | POA: Diagnosis not present

## 2024-06-23 DIAGNOSIS — M5416 Radiculopathy, lumbar region: Secondary | ICD-10-CM | POA: Diagnosis not present

## 2024-06-24 DIAGNOSIS — Z981 Arthrodesis status: Secondary | ICD-10-CM | POA: Diagnosis not present

## 2024-06-24 DIAGNOSIS — M545 Low back pain, unspecified: Secondary | ICD-10-CM | POA: Diagnosis not present

## 2024-06-24 DIAGNOSIS — G959 Disease of spinal cord, unspecified: Secondary | ICD-10-CM | POA: Diagnosis not present

## 2024-07-07 ENCOUNTER — Ambulatory Visit (INDEPENDENT_AMBULATORY_CARE_PROVIDER_SITE_OTHER): Admitting: Podiatry

## 2024-07-07 DIAGNOSIS — M7671 Peroneal tendinitis, right leg: Secondary | ICD-10-CM

## 2024-07-07 DIAGNOSIS — M722 Plantar fascial fibromatosis: Secondary | ICD-10-CM

## 2024-07-07 MED ORDER — PREDNISONE 5 MG PO TABS: ORAL_TABLET | ORAL | 1 refills | Status: DC

## 2024-07-07 NOTE — Progress Notes (Addendum)
 Patient presents complaining of pain along the indicates along peroneal tendons retromalleolar.  No real pain over the sinus tarsi today.  Has not noticed any redness or swelling   Physical exam:  General appearance: Pleasant, and in no acute distress. AOx3.  Vascular: Pedal pulses: DP 2/4 bilaterally, PT 2/4 bilaterally.  Minimal edema lower legs bilaterally. Capillary fill time immediate bilaterally.  Neurological:  Grossly intact bilaterally  Dermatologic:   Skin normal temperature bilaterally.  Skin normal color, tone, and texture bilaterally.   Musculoskeletal: Tenderness along the peroneal tendon in the retromalleolar right.  Tenderness with eversion against resistance.  +5+5 muscle strength lower extremity.  No tenderness in the sinus tarsi or with range of motion's sinus tarsi and subtalar joint.  Tenderness at the plantar lateral calcaneal tubercle right.   Diagnosis: 1.  Peroneus brevis right. 2.  Plantar fasciitis right  Plan: -Discussed with foot and ankle.  The subtalar joint pain has resolved however the peroneal tendon pain is returned.  Some plain along the plantar lateral aspect of the heel at the plantar fascia and the left plantar lateral plantar calcaneal tubercle.  Will try  some oral prednisone  to try to reduce this.  - Rx prednisone  5 mg, 30 mg p.o. daily first day, then decrease by 5 mg every other day for 12 days.  Return 2 weeks follow-up possible injection

## 2024-07-17 ENCOUNTER — Ambulatory Visit (HOSPITAL_COMMUNITY)
Admission: EM | Admit: 2024-07-17 | Discharge: 2024-07-17 | Disposition: A | Discharge status: Home / Self Care | Attending: Nurse Practitioner | Admitting: Nurse Practitioner

## 2024-07-17 ENCOUNTER — Encounter (HOSPITAL_COMMUNITY): Payer: Self-pay

## 2024-07-17 DIAGNOSIS — J069 Acute upper respiratory infection, unspecified: Secondary | ICD-10-CM | POA: Diagnosis not present

## 2024-07-17 DIAGNOSIS — R051 Acute cough: Secondary | ICD-10-CM | POA: Diagnosis not present

## 2024-07-17 LAB — POC SOFIA SARS ANTIGEN FIA: SARS Coronavirus 2 Ag: NEGATIVE

## 2024-07-17 LAB — POCT INFLUENZA A/B
Influenza A, POC: NEGATIVE
Influenza B, POC: NEGATIVE

## 2024-07-17 MED ORDER — BENZONATATE 200 MG PO CAPS: 200.0000 mg | ORAL_CAPSULE | Freq: Three times a day (TID) | ORAL | 0 refills | Status: AC | PRN

## 2024-07-17 NOTE — Discharge Instructions (Addendum)
 Your COVID, & FLU test was negative. Your symptoms are most likely due to a respiratory infection affecting your nose, throat, or lungs. These infections are usually caused by a virus, which means antibiotics will not help since they only treat bacterial infections.   Please take the medications prescribed to you as directed. For fever, body aches, or throat pain, you may use acetaminophen  (Tylenol ) or ibuprofen  (Advil , Motrin ) as needed. For nasal congestion, decongestants such as Sudafed or nasal sprays like Flonase  may be helpful. Saline nasal spray or saline rinses can also be used freely to help clear mucus. Sore throat discomfort may improve with throat lozenges, warm saltwater gargles, or menthol /breathing strips.  These treatments do not cure the illness but will help you feel more comfortable while your body heals. Staying hydrated is very important--drink plenty of fluids and aim for pale yellow urine. Using a cool mist humidifier, sitting in steam from a warm shower, and elevating your head when sleeping can also help with congestion and drainage. Be sure to replace your toothbrush once you start feeling better.  A cough can linger for several weeks after a respiratory infection even as other symptoms improve. This is normal as long as the cough is slowly getting better.  However, if your symptoms worsen, you have trouble breathing, chest pain, new high fever, coughing up blood, or you are unable to keep fluids down, please go to the emergency room or seek medical care right away. Follow up with your primary care provider if your symptoms are not improving within one week.

## 2024-07-17 NOTE — ED Provider Notes (Signed)
 " [facility]    CSN: [id number] Arrival date & time: 07/17/24  1253      History   Chief Complaint Chief Complaint  Patient presents with   Cough    HPI Rachel Crane is a 79 y.o. female.   Discussed the use of AI scribe software for clinical note transcription with the patient, who gave verbal consent to proceed.   The patient presents with a three-day history of cough and congestion. She reports significant postnasal drainage, describing mucus running down the back of her throat, accompanied by sneezing and sore throat. She endorses chest congestion and frequent coughing. She reports associated chills and headache but is unsure if she has had a fever. She also notes nasal congestion and rhinorrhea, for which she has been using a nasal spray.  The patient reports recent exposure to sick contacts at work, including coworkers with cough and confirmed influenza. She denies nausea, vomiting, or diarrhea. For symptom relief at home, she has tried ginger, garlic, and turmeric in addition to the nasal spray. She states that she received a flu vaccine this season.   The following sections of the patient's history were reviewed and updated as appropriate: allergies, current medications, past family history, past medical history, past social history, past surgical history, and problem list.     Past Medical History:  Diagnosis Date   Abnormal stress ECG 10/06/2018   Aortic valve sclerosis 10/06/2018   Arthritis    Asthma    Bilateral carotid bruits 10/06/2018   Headache    H/O bad HA, since using CPAP- she has had improvement with that issue    High cholesterol    Hypertension    Laboratory examination 10/06/2018   OSA on CPAP    settting - 8, uses CPAP q night    Pre-diabetes    Shingles     Patient Active Problem List   Diagnosis Date Noted   Radiculopathy, lumbar region 05/02/2023   Status post total right knee replacement 02/15/2022   Chronic low back pain  02/15/2022   Moderate persistent asthma without complication 11/03/2021   Gastroesophageal reflux disease with esophagitis 11/03/2021   Status post total left knee replacement 06/06/2020   Positive ANA (antinuclear antibody) 04/25/2020   Primary osteoarthritis of left knee 04/14/2020   Lumbar radiculopathy 06/26/2019   Left-sided low back pain with left-sided sciatica 06/26/2019   Thoracic spinal stenosis 04/18/2019   Seasonal allergies 12/04/2018   Seasonal and perennial allergic rhinoconjunctivitis 10/10/2018   Moderate persistent asthma with acute exacerbation 10/10/2018   Pruritus 10/10/2018   Aortic valve sclerosis 10/06/2018   Bilateral carotid bruits 10/06/2018   Abnormal stress ECG 10/06/2018   Laboratory examination 10/06/2018   Palpitations 10/06/2018   Total knee replacement status 09/19/2017   Sprain of anterior talofibular ligament of right ankle 12/13/2016    Past Surgical History:  Procedure Laterality Date   CHOLECYSTECTOMY  1998   TOTAL KNEE ARTHROPLASTY Right 09/19/2017   Procedure: RIGHT TOTAL KNEE ARTHROPLASTY;  Surgeon: Jerri Kay HERO, MD;  Location: [facility];  Service: Orthopedics;  Laterality: Right;   TOTAL KNEE ARTHROPLASTY Left 06/06/2020   Procedure: LEFT TOTAL KNEE ARTHROPLASTY;  Surgeon: Jerri Kay HERO, MD;  Location: [facility];  Service: Orthopedics;  Laterality: Left;   TRANSFORAMINAL LUMBAR INTERBODY FUSION (TLIF) WITH PEDICLE SCREW FIXATION 1 LEVEL Left 05/02/2023   Procedure: LEFT-SIDED LUMBAR THREE- LUMBAR FOUR TRANSFORAMINAL LUMBAR INTERBODY FUSION AND DECOMPRESSION WITH INSTRUMENTATION AND ALLOGRAFT;  Surgeon: Beuford Anes, MD;  Location: [facility];  Service: Orthopedics;  Laterality: Left;   TUBAL LIGATION      OB History   No obstetric history on file.      Home Medications    Prior to Admission medications  Medication Sig Start Date End Date Taking? Authorizing Provider  benzonatate  (TESSALON ) 200 MG capsule Take 1 capsule (200 mg total) by mouth  3 (three) times daily as needed for cough. 07/17/24  Yes Lanita Stammen, FNP  famotidine  (PEPCID ) 20 MG tablet Take 1 tablet (20 mg total) by mouth 2 (two) times daily. 12/25/23   Jeneal Danita Macintosh, MD  fexofenadine [facility] ALLERGY) 180 MG tablet Take 180 mg by mouth daily as needed for allergies.    [provider]  fluticasone  (FLONASE ) 50 MCG/ACT nasal spray Place 1 spray into both nostrils daily as needed (nasal congestion). 11/29/22   Crain, Whitney L, PA  gabapentin  (NEURONTIN ) 300 MG capsule Take 2 capsules (600 mg total) by mouth 3 (three) times daily. 11/05/22 07/07/24  Georgina Ozell LABOR, MD  hydrochlorothiazide  (HYDRODIURIL ) 25 MG tablet Take 12.5-25 mg by mouth daily. 11/02/23   [provider]  latanoprost  (XALATAN ) 0.005 % ophthalmic solution Place 1 drop into both eyes at bedtime. 12/31/22   [provider]  levocetirizine (XYZAL ) 5 MG tablet Take 1 tablet (5 mg total) by mouth 2 (two) times daily. 12/25/23   Jeneal Danita Macintosh, MD  meclizine  (ANTIVERT ) 12.5 MG tablet Take 1 tablet (12.5 mg total) by mouth 3 (three) times daily as needed for dizziness. 03/13/24   Yolande Lamar BROCKS, MD  montelukast  (SINGULAIR ) 10 MG tablet Take 1 tablet (10 mg total) by mouth at bedtime. 12/25/23   Jeneal Danita Macintosh, MD  Multiple Vitamin (MULTIVITAMIN) tablet Take 1 tablet by mouth daily.    [provider]  scopolamine  (TRANSDERM-SCOP) 1 MG/3DAYS Place 1 patch (1 mg total) onto the skin every 3 (three) days. 03/13/24   Yolande Lamar BROCKS, MD  tiZANidine  (ZANAFLEX ) 4 MG tablet Take 4 mg by mouth every 6 (six) hours as needed. 11/02/23   [provider]  traMADol  (ULTRAM ) 50 MG tablet Take 1 tablet (50 mg total) by mouth every 6 (six) hours as needed for moderate pain (pain score 4-6). 05/03/23   Beuford Anes, MD  triamcinolone  ointment (KENALOG ) 0.1 % Apply 1 Application topically 2 (two) times daily as needed (rash). 12/25/23   Jeneal Danita Macintosh, MD  triamcinolone  ointment (KENALOG ) 0.1 % Apply 1 Application topically 2 (two) times daily as needed. 12/25/23   Jeneal Danita Macintosh, MD    Family History Family History  Problem Relation Age of Onset   Hypertension Mother    Arthritis Mother    Breast cancer Sister    Sleep apnea Neg Hx     Social History Social History[1]   Allergies   Patient has no known allergies.   Review of Systems Review of Systems  Constitutional:  Positive for chills. Negative for fever.  HENT:  Positive for congestion, nosebleeds, postnasal drip, sneezing and sore throat.   Respiratory:  Positive for cough. Negative for shortness of breath and wheezing.   Gastrointestinal:  Negative for diarrhea, nausea and vomiting.  Musculoskeletal:  Negative for myalgias.  Neurological:  Positive for headaches.  All other systems reviewed and are negative.    Physical Exam Triage Vital Signs ED Triage Vitals [07/17/24 1409]  Encounter Vitals Group     BP (!) 171/73     Girls Systolic BP Percentile  Girls Diastolic BP Percentile      Boys Systolic BP Percentile      Boys Diastolic BP Percentile      Pulse Rate 71     Resp 18     Temp 98.7 F (37.1 C)     Temp Source Oral     SpO2 95 %     Weight      Height      Head Circumference      Peak Flow      Pain Score      Pain Loc      Pain Education      Exclude from Growth Chart    No data found.  Updated Vital Signs BP (!) 171/73 (BP Location: Left Arm)   Pulse 71   Temp 98.7 F (37.1 C) (Oral)   Resp 18   SpO2 95%   Visual Acuity Right Eye Distance:   Left Eye Distance:   Bilateral Distance:    Right Eye Near:   Left Eye Near:    Bilateral Near:     Physical Exam Vitals reviewed.  Constitutional:      General: She is awake. She is not in acute distress.    Appearance: Normal appearance. She is well-developed. She is not ill-appearing, toxic-appearing or diaphoretic.  HENT:     Head: Normocephalic.      Right Ear: Hearing, tympanic membrane, ear canal and external ear normal. No drainage, swelling or tenderness. No middle ear effusion. Tympanic membrane is not erythematous.     Left Ear: Hearing, tympanic membrane, ear canal and external ear normal. No drainage, swelling or tenderness.  No middle ear effusion. Tympanic membrane is not erythematous.     Nose: Congestion present.     Mouth/Throat:     Lips: Pink.     Mouth: Mucous membranes are moist.     Pharynx: Uvula midline. No pharyngeal swelling, oropharyngeal exudate, posterior oropharyngeal erythema or uvula swelling.     Tonsils: No tonsillar exudate or tonsillar abscesses.  Eyes:     General: Vision grossly intact.     Conjunctiva/sclera: Conjunctivae normal.  Cardiovascular:     Rate and Rhythm: Normal rate and regular rhythm.     Heart sounds: Normal heart sounds.  Pulmonary:     Effort: Pulmonary effort is normal. No tachypnea or respiratory distress.     Breath sounds: Normal breath sounds and air entry.     Comments: Respirations even and unlabored  Musculoskeletal:        General: Normal range of motion.     Cervical back: Full passive range of motion without pain, normal range of motion and neck supple.  Lymphadenopathy:     Cervical: No cervical adenopathy.  Skin:    General: Skin is warm and dry.  Neurological:     General: No focal deficit present.     Mental Status: She is alert and oriented to person, place, and time.  Psychiatric:        Speech: Speech normal.        Behavior: Behavior is cooperative.      UC Treatments / Results  Labs (all labs ordered are listed, but only abnormal results are displayed) Labs Reviewed  POC SOFIA SARS ANTIGEN FIA  POCT INFLUENZA A/B    EKG   Radiology No results found.  Procedures Procedures (including critical care time)  Medications Ordered in UC Medications - No data to display  Initial Impression / Assessment and Plan /  UC Course  I have reviewed the  triage vital signs and the nursing notes.  Pertinent labs & imaging results that were available during my care of the patient were reviewed by me and considered in my medical decision making (see chart for details).     The patient presents with symptoms consistent with a viral upper respiratory infection. FLU & COVID testing negative. Exam is reassuring and no evidence of bacterial infection or acute cardiopulmonary process is noted. Supportive care is recommended. Patient was advised to follow up with primary care if symptoms do not improve within one week or if new concerns arise. Instructions were given to seek emergency care if symptoms worsen, including shortness of breath, chest pain, persistent high fever, inability to tolerate fluids, or confusion.  Today's evaluation has revealed no signs of a dangerous process. Discussed diagnosis with patient and/or guardian. Patient and/or guardian aware of their diagnosis, possible red flag symptoms to watch out for and need for close follow up. Patient and/or guardian understands verbal and written discharge instructions. Patient and/or guardian comfortable with plan and disposition.  Patient and/or guardian has a clear mental status at this time, good insight into illness (after discussion and teaching) and has clear judgment to make decisions regarding their care  Documentation was completed with the aid of voice recognition software. Transcription may contain typographical errors.   Final Clinical Impressions(s) / UC Diagnoses   Final diagnoses:  Acute cough  Viral upper respiratory tract infection     Discharge Instructions      Your COVID, & FLU test was negative. Your symptoms are most likely due to a respiratory infection affecting your nose, throat, or lungs. These infections are usually caused by a virus, which means antibiotics will not help since they only treat bacterial infections.   Please take the medications prescribed to you  as directed. For fever, body aches, or throat pain, you may use acetaminophen  (Tylenol ) or ibuprofen  (Advil , Motrin ) as needed. For nasal congestion, decongestants such as Sudafed or nasal sprays like Flonase  may be helpful. Saline nasal spray or saline rinses can also be used freely to help clear mucus. Sore throat discomfort may improve with throat lozenges, warm saltwater gargles, or menthol /breathing strips.  These treatments do not cure the illness but will help you feel more comfortable while your body heals. Staying hydrated is very important--drink plenty of fluids and aim for pale yellow urine. Using a cool mist humidifier, sitting in steam from a warm shower, and elevating your head when sleeping can also help with congestion and drainage. Be sure to replace your toothbrush once you start feeling better.  A cough can linger for several weeks after a respiratory infection even as other symptoms improve. This is normal as long as the cough is slowly getting better.  However, if your symptoms worsen, you have trouble breathing, chest pain, new high fever, coughing up blood, or you are unable to keep fluids down, please go to the emergency room or seek medical care right away. Follow up with your primary care provider if your symptoms are not improving within one week.      ED Prescriptions     Medication Sig Dispense Auth. Provider   benzonatate  (TESSALON ) 200 MG capsule Take 1 capsule (200 mg total) by mouth 3 (three) times daily as needed for cough. 30 capsule Iola Lukes, FNP      PDMP not reviewed this encounter.     [1]  Social History Tobacco Use  Smoking status: Never   Smokeless tobacco: Never  Vaping Use   Vaping status: Never Used  Substance Use Topics   Alcohol  use: Not Currently   Drug use: No     Iola Lukes, FNP 07/17/24 1608  "

## 2024-07-17 NOTE — ED Triage Notes (Signed)
 Patient presents to the office for cough and congestion x 3 days. Patient states she is coughing up phlegm. Has not taken anything for symptoms.

## 2024-07-28 ENCOUNTER — Ambulatory Visit: Admitting: Podiatry

## 2024-08-10 ENCOUNTER — Ambulatory Visit (HOSPITAL_BASED_OUTPATIENT_CLINIC_OR_DEPARTMENT_OTHER): Admitting: Physical Therapy

## 2024-08-19 ENCOUNTER — Ambulatory Visit (HOSPITAL_BASED_OUTPATIENT_CLINIC_OR_DEPARTMENT_OTHER): Admitting: Physical Therapy

## 2024-08-19 NOTE — Therapy (Incomplete)
 " OUTPATIENT PHYSICAL THERAPY THORACOLUMBAR EVALUATION   Patient Name: Rachel Crane MRN: [id number] DOB:06-16-1946, 79 y.o., female Today's Date: 08/19/2024  END OF SESSION:   Past Medical History:  Diagnosis Date   Abnormal stress ECG 10/06/2018   Aortic valve sclerosis 10/06/2018   Arthritis    Asthma    Bilateral carotid bruits 10/06/2018   Headache    H/O bad HA, since using CPAP- she has had improvement with that issue    High cholesterol    Hypertension    Laboratory examination 10/06/2018   OSA on CPAP    settting - 8, uses CPAP q night    Pre-diabetes    Shingles    Past Surgical History:  Procedure Laterality Date   CHOLECYSTECTOMY  1998   TOTAL KNEE ARTHROPLASTY Right 09/19/2017   Procedure: RIGHT TOTAL KNEE ARTHROPLASTY;  Surgeon: Jerri Kay HERO, MD;  Location: [facility] OR;  Service: Orthopedics;  Laterality: Right;   TOTAL KNEE ARTHROPLASTY Left 06/06/2020   Procedure: LEFT TOTAL KNEE ARTHROPLASTY;  Surgeon: Jerri Kay HERO, MD;  Location: [facility] OR;  Service: Orthopedics;  Laterality: Left;   TRANSFORAMINAL LUMBAR INTERBODY FUSION (TLIF) WITH PEDICLE SCREW FIXATION 1 LEVEL Left 05/02/2023   Procedure: LEFT-SIDED LUMBAR THREE- LUMBAR FOUR TRANSFORAMINAL LUMBAR INTERBODY FUSION AND DECOMPRESSION WITH INSTRUMENTATION AND ALLOGRAFT;  Surgeon: Beuford Anes, MD;  Location: [facility] OR;  Service: Orthopedics;  Laterality: Left;   TUBAL LIGATION     Patient Active Problem List   Diagnosis Date Noted   Radiculopathy, lumbar region 05/02/2023   Status post total right knee replacement 02/15/2022   Chronic low back pain 02/15/2022   Moderate persistent asthma without complication 11/03/2021   Gastroesophageal reflux disease with esophagitis 11/03/2021   Status post total left knee replacement 06/06/2020   Positive ANA (antinuclear antibody) 04/25/2020   Primary osteoarthritis of left knee 04/14/2020   Lumbar radiculopathy 06/26/2019   Left-sided low back pain with left-sided sciatica  06/26/2019   Thoracic spinal stenosis 04/18/2019   Seasonal allergies 12/04/2018   Seasonal and perennial allergic rhinoconjunctivitis 10/10/2018   Moderate persistent asthma with acute exacerbation 10/10/2018   Pruritus 10/10/2018   Aortic valve sclerosis 10/06/2018   Bilateral carotid bruits 10/06/2018   Abnormal stress ECG 10/06/2018   Laboratory examination 10/06/2018   Palpitations 10/06/2018   Total knee replacement status 09/19/2017   Sprain of anterior talofibular ligament of right ankle 12/13/2016    PCP: Valery Ripple MD  REFERRING PROVIDER: Donaciano Sprang MD  REFERRING DIAG: M54.50 (ICD-10-CM) - Low back pain, unspecified   Rationale for Evaluation and Treatment: {HABREHAB:27488}  THERAPY DIAG:  No diagnosis found.  ONSET DATE: ***  SUBJECTIVE:  SUBJECTIVE STATEMENT: ***  PERTINENT HISTORY:  PMH: Lt TKA 06/06/20, Rt TKA 09/19/17, HTN, Aortic valve sclerosis, asthma, bilateral carotid bruits, HA, CPAP, shingles, pre-diabetes, L3-4 TLIF 10/17   PAIN:  Are you having pain? Yes: NPRS scale: *** Pain location: *** Pain description: *** Aggravating factors: *** Relieving factors: ***  PRECAUTIONS: {Therapy precautions:24002}  RED FLAGS: {PT Red Flags:29287}   WEIGHT BEARING RESTRICTIONS: {Yes ***/No:24003}  FALLS:  Has patient fallen in last 6 months? {fallsyesno:27318}  LIVING ENVIRONMENT: Lives with: lives alone   PLOF: Independent Currently working part time as an tax adviser at [employer]  Still driving   PATIENT GOALS: ***  NEXT MD VISIT: ***  OBJECTIVE:  Note: Objective measures were completed at Evaluation unless otherwise noted.  DIAGNOSTIC FINDINGS:  None in chart  PATIENT SURVEYS:  ODI  COGNITION: Overall cognitive status:  {cognition:24006}     SENSATION: {sensation:27233}  MUSCLE LENGTH: Hamstrings: Right *** deg; Left *** deg   POSTURE: {posture:25561}  PALPATION: ***  LUMBAR ROM:   AROM eval  Flexion   Extension   Right lateral flexion   Left lateral flexion   Right rotation   Left rotation    (Blank rows = not tested)  LOWER EXTREMITY ROM:     {AROM/PROM:27142}  Right eval Left eval  Hip flexion    Hip extension    Hip abduction    Hip adduction    Hip internal rotation    Hip external rotation    Knee flexion    Knee extension    Ankle dorsiflexion    Ankle plantarflexion    Ankle inversion    Ankle eversion     (Blank rows = not tested)  LOWER EXTREMITY MMT:    MMT Right eval Left eval  Hip flexion    Hip extension    Hip abduction    Hip adduction    Hip internal rotation    Hip external rotation    Knee flexion    Knee extension    Ankle dorsiflexion    Ankle plantarflexion    Ankle inversion    Ankle eversion     (Blank rows = not tested)  LUMBAR SPECIAL TESTS:  {lumbar special test:25242}  FUNCTIONAL TESTS:  5 times sit to stand: *** Timed up and go (TUG): ***  GAIT: Distance walked: *** Assistive device utilized: {Assistive devices:23999} Level of assistance: {Levels of assistance:24026} Comments: ***  TREATMENT  Eval Self care:Posture and optometrist instruction                                                                                                                                PATIENT EDUCATION:  Education details: Discussed eval findings, rehab rationale, aquatic program progression/POC and pools in area. Patient is in agreement  Person educated: Patient Education method: Explanation Education comprehension: verbalized understanding  HOME EXERCISE PROGRAM: ***  ASSESSMENT:  CLINICAL IMPRESSION: Patient is a 79 y.o. f who  was seen today for physical therapy evaluation and treatment for ***.   OBJECTIVE IMPAIRMENTS:  {opptimpairments:25111}.   ACTIVITY LIMITATIONS: {activitylimitations:27494}  PARTICIPATION LIMITATIONS: {participationrestrictions:25113}  PERSONAL FACTORS: {Personal factors:25162} are also affecting patient's functional outcome.   REHAB POTENTIAL: {rehabpotential:25112}  CLINICAL DECISION MAKING: {clinical decision making:25114}  EVALUATION COMPLEXITY: {Evaluation complexity:25115}   GOALS: Goals reviewed with patient? Yes  SHORT TERM GOALS: Target date: ***  *** Baseline: Goal status: INITIAL  2.  *** Baseline:  Goal status: INITIAL  3.  *** Baseline:  Goal status: INITIAL  4.  *** Baseline:  Goal status: INITIAL  5.  *** Baseline:  Goal status: INITIAL  6.  *** Baseline:  Goal status: INITIAL  LONG TERM GOALS: Target date: ***  *** Baseline:  Goal status: INITIAL  2.  *** Baseline:  Goal status: INITIAL  3.  *** Baseline:  Goal status: INITIAL  4.  *** Baseline:  Goal status: INITIAL  5.  *** Baseline:  Goal status: INITIAL  6.  *** Baseline:  Goal status: INITIAL  PLAN:  PT FREQUENCY: {rehab frequency:25116}  PT DURATION: {rehab duration:25117}  PLANNED INTERVENTIONS: 97164- PT Re-evaluation, 97750- Physical Performance Testing, 97110-Therapeutic exercises, 97530- Therapeutic activity, 97112- Neuromuscular re-education, 97535- Self Care, 02859- Manual therapy, [id number]- Gait training, [id number]- Aquatic Therapy, H9716- Electrical stimulation (unattended), [id number]- Electrical stimulation (manual), [id number]- Ionotophoresis 4mg /ml Dexamethasone , 79439 (1-2 muscles), 20561 (3+ muscles)- Dry Needling, Patient/Family education, Balance training, Stair training, Taping, Joint mobilization, and Cryotherapy.  PLAN FOR NEXT SESSION: PIERRETTE Shuck [place]) Falicia Lizotte MPT 08/19/24 7:47 AM [facility] [address] [place], [place], [address] Phone: [phone]   Fax:  [phone]   "

## 2024-08-20 ENCOUNTER — Ambulatory Visit (HOSPITAL_BASED_OUTPATIENT_CLINIC_OR_DEPARTMENT_OTHER): Admitting: Physical Therapy

## 2024-08-20 ENCOUNTER — Ambulatory Visit

## 2024-08-20 ENCOUNTER — Ambulatory Visit: Admitting: Podiatry

## 2024-08-20 DIAGNOSIS — M7671 Peroneal tendinitis, right leg: Secondary | ICD-10-CM

## 2024-08-20 DIAGNOSIS — M25571 Pain in right ankle and joints of right foot: Secondary | ICD-10-CM

## 2024-08-20 NOTE — Progress Notes (Incomplete)
 SABRA

## 2024-08-20 NOTE — Progress Notes (Signed)
 Still complaining of pain in the lateral aspect of the ankle and foot.  Says overall from time for Sarge well 70% better better but it still bothers her quite a bit and painful with walking.  Not noticed any redness or ecchymosis.   Physical exam:  General appearance: Pleasant, and in no acute distress. AOx3.  Vascular: Pedal pulses: DP 2/4 bilaterally, PT 2/4 bilaterally. Mild edema lower legs bilaterally. Capillary fill time bilaterally.  Neurological: Negative Tinel sign at sural nerve bilaterally  Dermatologic:   Skin normal temperature bilaterally.  Skin normal color, tone, and texture bilaterally.   Musculoskeletal:  Peroneal tendons right to the distal malleolus extending to the peroneal tubercle.  Some tenderness on the lateral aspect of the subtalar joint right.  No tenderness range of motion subtalar joint right.    Diagnosis: 1.  Peroneal tendinitis right  Plan: -Will try the third injection along the peroneal tendon sheath right.  If pain continues we will consider MRI at next visit to better evaluate peroneal tendons and subtalar joint.  - Injected 3cc 2:1 mixture 0.5 cc Marcaine : Triamcinolone  40mg /28ml at peroneal tendon sheath from peroneal tuberosity to the retromalleolar area right.     Return 2 weeks follow-up injection peroneal tendon sheath right, if no improvement consider MRI

## 2024-08-26 ENCOUNTER — Ambulatory Visit (HOSPITAL_BASED_OUTPATIENT_CLINIC_OR_DEPARTMENT_OTHER): Admitting: Physical Therapy

## 2024-09-02 ENCOUNTER — Ambulatory Visit (HOSPITAL_BASED_OUTPATIENT_CLINIC_OR_DEPARTMENT_OTHER): Admitting: Physical Therapy

## 2024-09-30 ENCOUNTER — Ambulatory Visit: Admitting: Orthopedic Surgery

## 2024-12-16 ENCOUNTER — Ambulatory Visit: Admitting: Adult Health

## 2024-12-31 ENCOUNTER — Ambulatory Visit: Admitting: Adult Health
